# Patient Record
Sex: Male | Born: 1945
Health system: Southern US, Community
[De-identification: ages and names within clinical notes are randomized; demographics above are authoritative.]

## PROBLEM LIST (undated history)

## (undated) DIAGNOSIS — R195 Other fecal abnormalities: Secondary | ICD-10-CM

## (undated) DIAGNOSIS — F419 Anxiety disorder, unspecified: Secondary | ICD-10-CM

## (undated) DIAGNOSIS — J302 Other seasonal allergic rhinitis: Secondary | ICD-10-CM

## (undated) DIAGNOSIS — M1612 Unilateral primary osteoarthritis, left hip: Secondary | ICD-10-CM

## (undated) DIAGNOSIS — Z86718 Personal history of other venous thrombosis and embolism: Secondary | ICD-10-CM

## (undated) DIAGNOSIS — I4891 Unspecified atrial fibrillation: Secondary | ICD-10-CM

## (undated) DIAGNOSIS — G4730 Sleep apnea, unspecified: Secondary | ICD-10-CM

## (undated) DIAGNOSIS — I701 Atherosclerosis of renal artery: Secondary | ICD-10-CM

## (undated) DIAGNOSIS — R600 Localized edema: Secondary | ICD-10-CM

## (undated) DIAGNOSIS — D539 Nutritional anemia, unspecified: Secondary | ICD-10-CM

## (undated) DIAGNOSIS — I82409 Acute embolism and thrombosis of unspecified deep veins of unspecified lower extremity: Secondary | ICD-10-CM

## (undated) DIAGNOSIS — M109 Gout, unspecified: Secondary | ICD-10-CM

## (undated) DIAGNOSIS — E876 Hypokalemia: Secondary | ICD-10-CM

## (undated) DIAGNOSIS — I499 Cardiac arrhythmia, unspecified: Secondary | ICD-10-CM

## (undated) DIAGNOSIS — E785 Hyperlipidemia, unspecified: Secondary | ICD-10-CM

## (undated) DIAGNOSIS — K625 Hemorrhage of anus and rectum: Secondary | ICD-10-CM

## (undated) DIAGNOSIS — R7303 Prediabetes: Secondary | ICD-10-CM

## (undated) DIAGNOSIS — R0601 Orthopnea: Secondary | ICD-10-CM

## (undated) DIAGNOSIS — Q249 Congenital malformation of heart, unspecified: Secondary | ICD-10-CM

## (undated) DIAGNOSIS — E46 Unspecified protein-calorie malnutrition: Secondary | ICD-10-CM

## (undated) DIAGNOSIS — K219 Gastro-esophageal reflux disease without esophagitis: Secondary | ICD-10-CM

## (undated) DIAGNOSIS — I10 Essential (primary) hypertension: Secondary | ICD-10-CM

## (undated) DIAGNOSIS — M1990 Unspecified osteoarthritis, unspecified site: Secondary | ICD-10-CM

## (undated) DIAGNOSIS — J189 Pneumonia, unspecified organism: Secondary | ICD-10-CM

## (undated) DIAGNOSIS — E119 Type 2 diabetes mellitus without complications: Secondary | ICD-10-CM

## (undated) DIAGNOSIS — E8809 Other disorders of plasma-protein metabolism, not elsewhere classified: Secondary | ICD-10-CM

## (undated) HISTORY — DX: Congenital malformation of heart, unspecified: Q24.9

## (undated) HISTORY — DX: Sleep apnea, unspecified: G47.30

## (undated) HISTORY — DX: Unspecified atrial fibrillation: I48.91

## (undated) HISTORY — DX: Gout, unspecified: M10.9

## (undated) HISTORY — DX: Other seasonal allergic rhinitis: J30.2

## (undated) HISTORY — PX: CARPAL TUNNEL RELEASE: SHX101

## (undated) HISTORY — DX: Prediabetes: R73.03

## (undated) HISTORY — DX: Localized edema: R60.0

## (undated) HISTORY — DX: Hemorrhage of anus and rectum: K62.5

## (undated) HISTORY — DX: Type 2 diabetes mellitus without complications: E11.9

## (undated) HISTORY — DX: Anxiety disorder, unspecified: F41.9

## (undated) HISTORY — DX: Unilateral primary osteoarthritis, left hip: M16.12

## (undated) HISTORY — DX: Unspecified osteoarthritis, unspecified site: M19.90

## (undated) HISTORY — DX: Unspecified protein-calorie malnutrition: E46

## (undated) HISTORY — DX: Acute embolism and thrombosis of unspecified deep veins of unspecified lower extremity: I82.409

## (undated) HISTORY — DX: Personal history of other venous thrombosis and embolism: Z86.718

## (undated) HISTORY — DX: Hyperlipidemia, unspecified: E78.5

## (undated) HISTORY — DX: Gastro-esophageal reflux disease without esophagitis: K21.9

## (undated) HISTORY — DX: Essential (primary) hypertension: I10

## (undated) HISTORY — DX: Other fecal abnormalities: R19.5

## (undated) HISTORY — PX: APPENDECTOMY: SHX54

## (undated) HISTORY — DX: Orthopnea: R06.01

## (undated) HISTORY — DX: Morbid (severe) obesity due to excess calories: E66.01

## (undated) HISTORY — DX: Hypokalemia: E87.6

## (undated) HISTORY — DX: Other disorders of plasma-protein metabolism, not elsewhere classified: E88.09

## (undated) HISTORY — DX: Nutritional anemia, unspecified: D53.9

---

## 2014-02-07 ENCOUNTER — Other Ambulatory Visit: Payer: Self-pay | Admitting: *Deleted

## 2014-02-07 ENCOUNTER — Encounter: Payer: Self-pay | Admitting: Surgery

## 2014-02-07 DIAGNOSIS — I701 Atherosclerosis of renal artery: Secondary | ICD-10-CM

## 2014-02-10 ENCOUNTER — Encounter: Payer: Self-pay | Admitting: Surgery

## 2014-02-13 ENCOUNTER — Encounter: Payer: Self-pay | Admitting: Surgery

## 2014-02-13 ENCOUNTER — Ambulatory Visit (INDEPENDENT_AMBULATORY_CARE_PROVIDER_SITE_OTHER): Payer: Commercial Managed Care - HMO | Admitting: Surgery

## 2014-02-13 ENCOUNTER — Encounter: Payer: Commercial Managed Care - HMO | Admitting: Surgery

## 2014-02-13 ENCOUNTER — Ambulatory Visit (HOSPITAL_COMMUNITY)
Admission: RE | Admit: 2014-02-13 | Discharge: 2014-02-13 | Disposition: A | Discharge status: Home / Self Care | Payer: Medicare HMO | Source: Ambulatory Visit | Attending: Surgery | Admitting: Surgery

## 2014-02-13 VITALS — BP 136/79 | HR 82 | Temp 98.5°F | Resp 18 | Ht 68.0 in | Wt 216.0 lb

## 2014-02-13 DIAGNOSIS — I701 Atherosclerosis of renal artery: Secondary | ICD-10-CM | POA: Diagnosis not present

## 2014-02-13 NOTE — Progress Notes (Signed)
Patient name: Logan [name] MRN: [id number] DOB: 01-13-1946 Sex: male   Referred by: Dr. Laqueta Due  Reason for referral:  Chief Complaint  Patient presents with  . Arterial Stenosis    Renal artery stenosis  REF- Dr. Laqueta Due   Pt has elevated HTN     HISTORY OF PRESENT ILLNESS: This is a very pleasant 68 year old gentleman who is referred today for evaluation of possible renal artery stenosis.  The patient had a renal artery ultrasound and aspirin which suggested right renal artery stenosis.  The patient has had a very difficult time with blood pressure control, requiring multiple medications.  The patient suffers from diabetes.  His blood sugars have been under good control, ranging from 85-1 05.  The patient has a history of tobacco abuse.  He quit smoking in 1998.  He used chewing tobacco currently.  Past Medical History  Diagnosis Date  . Hypertension   . Diabetes mellitus without complication   . Esophageal reflux   . Gout     Past Surgical History  Procedure Laterality Date  . Appendectomy      History   Social History  . Marital Status: Widowed    Spouse Name: N/A    Number of Children: N/A  . Years of Education: N/A   Occupational History  . Not on file.   Social History Main Topics  . Smoking status: Former Smoker    Quit date: 02/07/1997  . Smokeless tobacco: Current User    Types: Chew  . Alcohol Use: Not on file  . Drug Use: Not on file  . Sexual Activity: Not on file   Other Topics Concern  . Not on file   Social History Narrative  . No narrative on file    Family History  Problem Relation Age of Onset  . Heart disease Mother   . Cancer Father     throat cancer    Allergies as of 02/13/2014  . (No Known Allergies)    Current Outpatient Prescriptions on File Prior to Visit  Medication Sig Dispense Refill  . allopurinol (ZYLOPRIM) 300 MG tablet Take 300 mg by mouth daily.      Marland Kitchen ALPRAZolam (XANAX) 0.25 MG tablet Take 0.25 mg by  mouth 2 (two) times daily as needed for anxiety.      Marland Kitchen aspirin 81 MG tablet Take 81 mg by mouth daily.      . Cholecalciferol (VITAMIN D-3) 1000 UNITS CAPS Take 1,000 Units by mouth daily.      Marland Kitchen DHEA 25 MG CAPS Take 25 mg by mouth daily.      . hydrALAZINE (APRESOLINE) 50 MG tablet Take 50 mg by mouth 3 (three) times daily.      Marland Kitchen ketoconazole (NIZORAL) 2 % cream Apply 1 application topically daily.      Marland Kitchen LOSARTAN POTASSIUM-HCTZ PO Take 100 mg by mouth daily.      . metFORMIN (GLUCOPHAGE) 500 MG tablet Take by mouth 2 (two) times daily with a meal.      . omeprazole (PRILOSEC) 20 MG capsule Take 20 mg by mouth daily.      . vitamin B-12 (CYANOCOBALAMIN) 1000 MCG tablet Take 1,000 mcg by mouth daily.       No current facility-administered medications on file prior to visit.     REVIEW OF SYSTEMS: Cardiovascular: No chest pain, chest pressure, palpitations, orthopnea, or dyspnea on exertion. No claudication or rest pain,  No history of DVT or phlebitis. Pulmonary: No  productive cough, asthma or wheezing. Neurologic: Positive leg weakness, no paresthesias, aphasia, or amaurosis. No dizziness. Hematologic: No bleeding problems or clotting disorders. Musculoskeletal: No joint pain or joint swelling. Gastrointestinal: No blood in stool or hematemesis Genitourinary: No dysuria or hematuria. Psychiatric:: No history of major depression. Integumentary: No rashes or ulcers. Constitutional: No fever or chills.  PHYSICAL EXAMINATION: General: The patient appears their stated age.  Vital signs are BP 136/79  Pulse 82  Temp(Src) 98.5 F (36.9 C) (Oral)  Resp 18  Ht 5\' 8"  (1.727 m)  Wt 216 lb (97.977 kg)  BMI 32.85 kg/m2  SpO2 100% HEENT:  No gross abnormalities Pulmonary: Respirations are non-labored Abdomen: Soft and non-tender.  No abdominal bruits.  Musculoskeletal: There are no major deformities.   Neurologic: No focal weakness or paresthesias are detected, Skin: There are no ulcer  or rashes noted. Psychiatric: The patient has normal affect. Cardiovascular: There is a regular rate and rhythm without significant murmur appreciated.  Palpable dorsalis pedis pulse bilaterally.  No carotid bruits.  Diagnostic Studies: I have reviewed his outside ultrasound which suggests right renal artery stenosis, greater than 60%.  I have also ordered and reviewed a repeat ultrasound here in our office.  This did not show evidence of renal artery stenosis.   Assessment:  Possible right renal artery stenosis Plan: There are conflicting reports from his renal Doppler study.  Because his blood pressure has been so difficult to control, I feel we need to proceed with the goal standard which would be abdominal angiography.  If stenosis is identified in either the right or the left renal artery, stenting would be performed at that time.  The risks and benefits of the procedure were discussed with the patient, including the fact that even with stenting this may not correct his blood pressure problems.  He wishes to proceed.  The procedure has been scheduled for Tuesday, November 10.     Eldridge Abrahams, M.D. [facility] Office: [phone] Pager:  [phone]

## 2014-02-14 ENCOUNTER — Other Ambulatory Visit: Payer: Self-pay | Admitting: *Deleted

## 2014-02-14 ENCOUNTER — Encounter: Payer: Self-pay | Admitting: *Deleted

## 2014-02-16 ENCOUNTER — Encounter (HOSPITAL_COMMUNITY): Payer: Self-pay | Admitting: Pharmacy Technician

## 2014-02-28 ENCOUNTER — Encounter (HOSPITAL_COMMUNITY)
Admission: RE | Disposition: A | Discharge status: Home / Self Care | Payer: Self-pay | Source: Ambulatory Visit | Attending: Surgery

## 2014-02-28 ENCOUNTER — Encounter (HOSPITAL_COMMUNITY): Payer: Self-pay | Admitting: Surgery

## 2014-02-28 ENCOUNTER — Ambulatory Visit (HOSPITAL_COMMUNITY)
Admission: RE | Admit: 2014-02-28 | Discharge: 2014-02-28 | Disposition: A | Discharge status: Home / Self Care | Payer: Medicare HMO | Source: Ambulatory Visit | Attending: Surgery | Admitting: Surgery

## 2014-02-28 DIAGNOSIS — K219 Gastro-esophageal reflux disease without esophagitis: Secondary | ICD-10-CM | POA: Insufficient documentation

## 2014-02-28 DIAGNOSIS — I701 Atherosclerosis of renal artery: Secondary | ICD-10-CM | POA: Diagnosis not present

## 2014-02-28 DIAGNOSIS — E119 Type 2 diabetes mellitus without complications: Secondary | ICD-10-CM | POA: Diagnosis not present

## 2014-02-28 DIAGNOSIS — I10 Essential (primary) hypertension: Secondary | ICD-10-CM | POA: Insufficient documentation

## 2014-02-28 DIAGNOSIS — F17220 Nicotine dependence, chewing tobacco, uncomplicated: Secondary | ICD-10-CM | POA: Diagnosis not present

## 2014-02-28 DIAGNOSIS — M109 Gout, unspecified: Secondary | ICD-10-CM | POA: Insufficient documentation

## 2014-02-28 DIAGNOSIS — Z7982 Long term (current) use of aspirin: Secondary | ICD-10-CM | POA: Diagnosis not present

## 2014-02-28 DIAGNOSIS — Z79899 Other long term (current) drug therapy: Secondary | ICD-10-CM | POA: Diagnosis not present

## 2014-02-28 HISTORY — DX: Atherosclerosis of renal artery: I70.1

## 2014-02-28 HISTORY — PX: ABDOMINAL AORTAGRAM: SHX5454

## 2014-02-28 LAB — GLUCOSE, CAPILLARY
Glucose-Capillary: 97 mg/dL (ref 70–99)
Glucose-Capillary: 90 mg/dL (ref 70–99)
Glucose-Capillary: 94 mg/dL (ref 70–99)

## 2014-02-28 LAB — POCT I-STAT, CHEM 8
BUN: 11 mg/dL (ref 6–23)
CALCIUM ION: 1.22 mmol/L (ref 1.13–1.30)
CHLORIDE: 108 meq/L (ref 96–112)
CREATININE: 0.7 mg/dL (ref 0.50–1.35)
GLUCOSE: 103 mg/dL — AB (ref 70–99)
HCT: 46 % (ref 39.0–52.0)
Hemoglobin: 15.6 g/dL (ref 13.0–17.0)
Potassium: 3.4 mEq/L — ABNORMAL LOW (ref 3.7–5.3)
Sodium: 142 mEq/L (ref 137–147)
TCO2: 22 mmol/L (ref 0–100)

## 2014-02-28 SURGERY — ABDOMINAL AORTAGRAM
Anesthesia: LOCAL

## 2014-02-28 MED ORDER — HYDRALAZINE HCL 20 MG/ML IJ SOLN: 5.0000 mg | INTRAMUSCULAR | Status: DC | PRN

## 2014-02-28 MED ORDER — LABETALOL HCL 5 MG/ML IV SOLN: 10.0000 mg | INTRAVENOUS | Status: DC | PRN

## 2014-02-28 MED ORDER — MORPHINE SULFATE 10 MG/ML IJ SOLN: 2.0000 mg | INTRAMUSCULAR | Status: DC | PRN

## 2014-02-28 MED ORDER — LIDOCAINE HCL (PF) 1 % IJ SOLN
INTRAMUSCULAR | Status: AC
  Filled 2014-02-28: qty 30

## 2014-02-28 MED ORDER — GUAIFENESIN-DM 100-10 MG/5ML PO SYRP: 15.0000 mL | ORAL_SOLUTION | ORAL | Status: DC | PRN

## 2014-02-28 MED ORDER — PHENOL 1.4 % MT LIQD: 1.0000 | OROMUCOSAL | Status: DC | PRN

## 2014-02-28 MED ORDER — ONDANSETRON HCL 4 MG/2ML IJ SOLN: 4.0000 mg | Freq: Four times a day (QID) | INTRAMUSCULAR | Status: DC | PRN

## 2014-02-28 MED ORDER — METOPROLOL TARTRATE 1 MG/ML IV SOLN: 2.0000 mg | INTRAVENOUS | Status: DC | PRN

## 2014-02-28 MED ORDER — FENTANYL CITRATE 0.05 MG/ML IJ SOLN
INTRAMUSCULAR | Status: AC
  Filled 2014-02-28: qty 2

## 2014-02-28 MED ORDER — OXYCODONE HCL 5 MG PO TABS: 5.0000 mg | ORAL_TABLET | ORAL | Status: DC | PRN

## 2014-02-28 MED ORDER — ACETAMINOPHEN 325 MG RE SUPP: 325.0000 mg | RECTAL | Status: DC | PRN

## 2014-02-28 MED ORDER — MIDAZOLAM HCL 2 MG/2ML IJ SOLN
INTRAMUSCULAR | Status: AC
  Filled 2014-02-28: qty 2

## 2014-02-28 MED ORDER — ACETAMINOPHEN 325 MG PO TABS: 325.0000 mg | ORAL_TABLET | ORAL | Status: DC | PRN

## 2014-02-28 MED ORDER — HEPARIN (PORCINE) IN NACL 2-0.9 UNIT/ML-% IJ SOLN
INTRAMUSCULAR | Status: AC
  Filled 2014-02-28: qty 1000

## 2014-02-28 MED ORDER — SODIUM CHLORIDE 0.9 % IV SOLN
INTRAVENOUS | Status: DC
  Administered 2014-02-28: 07:00:00 via INTRAVENOUS

## 2014-02-28 MED ORDER — ALUM & MAG HYDROXIDE-SIMETH 200-200-20 MG/5ML PO SUSP: 15.0000 mL | ORAL | Status: DC | PRN

## 2014-02-28 MED ORDER — SODIUM CHLORIDE 0.9 % IV SOLN: 1.0000 mL/kg/h | INTRAVENOUS | Status: DC

## 2014-02-28 SURGICAL SUPPLY — 53 items
BANDAGE ELASTIC 4 VELCRO ST LF (GAUZE/BANDAGES/DRESSINGS) IMPLANT
BANDAGE ESMARK 6X9 LF (GAUZE/BANDAGES/DRESSINGS) IMPLANT
BNDG ESMARK 6X9 LF (GAUZE/BANDAGES/DRESSINGS)
CANISTER SUCTION 2500CC (MISCELLANEOUS) ×2 IMPLANT
CLIP TI MEDIUM 24 (CLIP) ×2 IMPLANT
CLIP TI WIDE RED SMALL 24 (CLIP) ×2 IMPLANT
COVER SURGICAL LIGHT HANDLE (MISCELLANEOUS) ×2 IMPLANT
CUFF TOURNIQUET SINGLE 24IN (TOURNIQUET CUFF) IMPLANT
CUFF TOURNIQUET SINGLE 34IN LL (TOURNIQUET CUFF) IMPLANT
CUFF TOURNIQUET SINGLE 44IN (TOURNIQUET CUFF) IMPLANT
DERMABOND ADVANCED (GAUZE/BANDAGES/DRESSINGS) ×1
DERMABOND ADVANCED .7 DNX12 (GAUZE/BANDAGES/DRESSINGS) ×1 IMPLANT
DRAIN CHANNEL 15F RND FF W/TCR (WOUND CARE) IMPLANT
DRAPE WARM FLUID 44X44 (DRAPE) ×2 IMPLANT
DRAPE X-RAY CASS 24X20 (DRAPES) IMPLANT
DRSG COVADERM 4X10 (GAUZE/BANDAGES/DRESSINGS) IMPLANT
DRSG COVADERM 4X8 (GAUZE/BANDAGES/DRESSINGS) IMPLANT
ELECT REM PT RETURN 9FT ADLT (ELECTROSURGICAL) ×2
ELECTRODE REM PT RTRN 9FT ADLT (ELECTROSURGICAL) ×1 IMPLANT
EVACUATOR SILICONE 100CC (DRAIN) IMPLANT
GLOVE BIOGEL PI IND STRL 7.5 (GLOVE) ×1 IMPLANT
GLOVE BIOGEL PI INDICATOR 7.5 (GLOVE) ×1
GLOVE SURG SS PI 7.5 STRL IVOR (GLOVE) ×2 IMPLANT
GOWN PREVENTION PLUS XXLARGE (GOWN DISPOSABLE) ×2 IMPLANT
GOWN STRL NON-REIN LRG LVL3 (GOWN DISPOSABLE) ×6 IMPLANT
HEMOSTAT SNOW SURGICEL 2X4 (HEMOSTASIS) IMPLANT
KIT BASIN OR (CUSTOM PROCEDURE TRAY) ×2 IMPLANT
KIT ROOM TURNOVER OR (KITS) ×2 IMPLANT
MARKER GRAFT CORONARY BYPASS (MISCELLANEOUS) IMPLANT
NS IRRIG 1000ML POUR BTL (IV SOLUTION) ×4 IMPLANT
PACK PERIPHERAL VASCULAR (CUSTOM PROCEDURE TRAY) ×2 IMPLANT
PAD ARMBOARD 7.5X6 YLW CONV (MISCELLANEOUS) ×4 IMPLANT
PADDING CAST COTTON 6X4 STRL (CAST SUPPLIES) IMPLANT
SET COLLECT BLD 21X3/4 12 (NEEDLE) IMPLANT
STOPCOCK 4 WAY LG BORE MALE ST (IV SETS) IMPLANT
SUT ETHILON 3 0 PS 1 (SUTURE) IMPLANT
SUT PROLENE 5 0 C 1 24 (SUTURE) ×2 IMPLANT
SUT PROLENE 6 0 BV (SUTURE) ×2 IMPLANT
SUT PROLENE 7 0 BV 1 (SUTURE) IMPLANT
SUT SILK 2 0 SH (SUTURE) ×2 IMPLANT
SUT SILK 3 0 (SUTURE)
SUT SILK 3-0 18XBRD TIE 12 (SUTURE) IMPLANT
SUT VIC AB 2-0 CT1 27 (SUTURE) ×2
SUT VIC AB 2-0 CT1 TAPERPNT 27 (SUTURE) ×2 IMPLANT
SUT VIC AB 3-0 SH 27 (SUTURE) ×2
SUT VIC AB 3-0 SH 27X BRD (SUTURE) ×2 IMPLANT
SUT VICRYL 4-0 PS2 18IN ABS (SUTURE) ×4 IMPLANT
TOWEL OR 17X24 6PK STRL BLUE (TOWEL DISPOSABLE) ×4 IMPLANT
TOWEL OR 17X26 10 PK STRL BLUE (TOWEL DISPOSABLE) ×4 IMPLANT
TRAY FOLEY CATH 16FRSI W/METER (SET/KITS/TRAYS/PACK) ×2 IMPLANT
TUBING EXTENTION W/L.L. (IV SETS) IMPLANT
UNDERPAD 30X30 INCONTINENT (UNDERPADS AND DIAPERS) ×2 IMPLANT
WATER STERILE IRR 1000ML POUR (IV SOLUTION) ×2 IMPLANT

## 2014-02-28 NOTE — Progress Notes (Signed)
Site area: right groin a 5 french sheath was removed  Site Prior to Removal:  Level 0  Pressure Applied For 20 MINUTES    Minutes Beginning at 0830am  Manual:   Yes.    Patient Status During Pull:  stable  Post Pull Groin Site:  Level 0  Post Pull Instructions Given:  Yes.    Post Pull Pulses Present:  Yes.    Dressing Applied:  Yes.    Comments:  VS remain  Stable during sheath pull.  Pt denies any discomfort at site

## 2014-02-28 NOTE — Interval H&P Note (Signed)
History and Physical Interval Note:  02/28/2014 7:36 AM  Logan [name]  has presented today for surgery, with the diagnosis of renal arotery stenosis  The various methods of treatment have been discussed with the patient and family. After consideration of risks, benefits and other options for treatment, the patient has consented to  Procedure(s): ABDOMINAL AORTAGRAM (N/A) as a surgical intervention .  The patient's history has been reviewed, patient examined, no change in status, stable for surgery.  I have reviewed the patient's chart and labs.  Questions were answered to the patient's satisfaction.     BRABHAM IV, V. WELLS

## 2014-02-28 NOTE — H&P (View-Only) (Signed)
Patient name: Logan [name] MRN: [id number] DOB: 05/24/1945 Sex: male   Referred by: Dr. Laqueta Due  Reason for referral:  Chief Complaint  Patient presents with  . Arterial Stenosis    Renal artery stenosis  REF- Dr. Laqueta Due   Pt has elevated HTN     HISTORY OF PRESENT ILLNESS: This is a very pleasant 68 year old gentleman who is referred today for evaluation of possible renal artery stenosis.  The patient had a renal artery ultrasound and aspirin which suggested right renal artery stenosis.  The patient has had a very difficult time with blood pressure control, requiring multiple medications.  The patient suffers from diabetes.  His blood sugars have been under good control, ranging from 85-1 05.  The patient has a history of tobacco abuse.  He quit smoking in 1998.  He used chewing tobacco currently.  Past Medical History  Diagnosis Date  . Hypertension   . Diabetes mellitus without complication   . Esophageal reflux   . Gout     Past Surgical History  Procedure Laterality Date  . Appendectomy      History   Social History  . Marital Status: Widowed    Spouse Name: N/A    Number of Children: N/A  . Years of Education: N/A   Occupational History  . Not on file.   Social History Main Topics  . Smoking status: Former Smoker    Quit date: 02/07/1997  . Smokeless tobacco: Current User    Types: Chew  . Alcohol Use: Not on file  . Drug Use: Not on file  . Sexual Activity: Not on file   Other Topics Concern  . Not on file   Social History Narrative  . No narrative on file    Family History  Problem Relation Age of Onset  . Heart disease Mother   . Cancer Father     throat cancer    Allergies as of 02/13/2014  . (No Known Allergies)    Current Outpatient Prescriptions on File Prior to Visit  Medication Sig Dispense Refill  . allopurinol (ZYLOPRIM) 300 MG tablet Take 300 mg by mouth daily.      Marland Kitchen ALPRAZolam (XANAX) 0.25 MG tablet Take 0.25 mg by  mouth 2 (two) times daily as needed for anxiety.      Marland Kitchen aspirin 81 MG tablet Take 81 mg by mouth daily.      . Cholecalciferol (VITAMIN D-3) 1000 UNITS CAPS Take 1,000 Units by mouth daily.      Marland Kitchen DHEA 25 MG CAPS Take 25 mg by mouth daily.      . hydrALAZINE (APRESOLINE) 50 MG tablet Take 50 mg by mouth 3 (three) times daily.      Marland Kitchen ketoconazole (NIZORAL) 2 % cream Apply 1 application topically daily.      Marland Kitchen LOSARTAN POTASSIUM-HCTZ PO Take 100 mg by mouth daily.      . metFORMIN (GLUCOPHAGE) 500 MG tablet Take by mouth 2 (two) times daily with a meal.      . omeprazole (PRILOSEC) 20 MG capsule Take 20 mg by mouth daily.      . vitamin B-12 (CYANOCOBALAMIN) 1000 MCG tablet Take 1,000 mcg by mouth daily.       No current facility-administered medications on file prior to visit.     REVIEW OF SYSTEMS: Cardiovascular: No chest pain, chest pressure, palpitations, orthopnea, or dyspnea on exertion. No claudication or rest pain,  No history of DVT or phlebitis. Pulmonary: No  productive cough, asthma or wheezing. Neurologic: Positive leg weakness, no paresthesias, aphasia, or amaurosis. No dizziness. Hematologic: No bleeding problems or clotting disorders. Musculoskeletal: No joint pain or joint swelling. Gastrointestinal: No blood in stool or hematemesis Genitourinary: No dysuria or hematuria. Psychiatric:: No history of major depression. Integumentary: No rashes or ulcers. Constitutional: No fever or chills.  PHYSICAL EXAMINATION: General: The patient appears their stated age.  Vital signs are BP 136/79  Pulse 82  Temp(Src) 98.5 F (36.9 C) (Oral)  Resp 18  Ht 5\' 8"  (1.727 m)  Wt 216 lb (97.977 kg)  BMI 32.85 kg/m2  SpO2 100% HEENT:  No gross abnormalities Pulmonary: Respirations are non-labored Abdomen: Soft and non-tender.  No abdominal bruits.  Musculoskeletal: There are no major deformities.   Neurologic: No focal weakness or paresthesias are detected, Skin: There are no ulcer  or rashes noted. Psychiatric: The patient has normal affect. Cardiovascular: There is a regular rate and rhythm without significant murmur appreciated.  Palpable dorsalis pedis pulse bilaterally.  No carotid bruits.  Diagnostic Studies: I have reviewed his outside ultrasound which suggests right renal artery stenosis, greater than 60%.  I have also ordered and reviewed a repeat ultrasound here in our office.  This did not show evidence of renal artery stenosis.   Assessment:  Possible right renal artery stenosis Plan: There are conflicting reports from his renal Doppler study.  Because his blood pressure has been so difficult to control, I feel we need to proceed with the goal standard which would be abdominal angiography.  If stenosis is identified in either the right or the left renal artery, stenting would be performed at that time.  The risks and benefits of the procedure were discussed with the patient, including the fact that even with stenting this may not correct his blood pressure problems.  He wishes to proceed.  The procedure has been scheduled for Tuesday, November 10.     Eldridge Abrahams, M.D. [facility] [place] Office: [phone] Pager:  [phone]

## 2014-02-28 NOTE — Op Note (Signed)
    Patient name: Logan [name] MRN: [id number] DOB: January 31, 1946 Sex: male  02/28/2014 Pre-operative Diagnosis: possible right renal artery stenosis Post-operative diagnosis:  Same Surgeon:  Eldridge Abrahams Procedure Performed:  1.  Ultrasound-guided access, right femoral artery  2.  Abdominal aortogram with bifemoral runoff  3.  Conscious sedation, 30 minutes   Indications:  The patient has uncontrolled hypertension.  An outside ultrasound showed greater than 60% right renal artery stenosis.  This was repeated in my office which did not show renal artery stenosis.  He is here for confirmatory testing.  Procedure:  The patient was identified in the holding area and taken to room 8.  The patient was then placed supine on the table and prepped and draped in the usual sterile fashion.  A time out was called.  Conscious sedation was performed under direct physician monitoring with the use of IV fentanyl and Versed for 30 minutes.n  Ultrasound was used to evaluate the right common femoral artery.  It was patent .  A digital ultrasound image was acquired.  A micropuncture needle was used to access the right common femoral artery under ultrasound guidance.  An 018 wire was advanced without resistance and a micropuncture sheath was placed.  The 018 wire was removed and a benson wire was placed.  The micropuncture sheath was exchanged for a 5 french sheath.  An omniflush catheter was advanced over the wire to the level of L-1.  An abdominal bifemoral angiogram was obtained.  Next, the pigtail was rotated into a RAO and LAO positioning, and repeat angiography was performed to better define the ostium of the renal arteries.   Findings:   Aortogram:  No significant suprarenal aortic stenosis.  The visceral vessels appear widely patent.  There is approximately 20% stenosis at the origin of the right renal artery.  The left renal artery is widely patent.  The infrarenal abdominal aorta is widely patent.   Bilateral common and external iliac arteries are widely patent without stenosis.  Right Lower Extremity:  Visualized portions of the right common femoral artery are widely patent  Left Lower Extremity:  Visualized portions the left femoral artery are widely patent  Intervention:  none  Impression:  #1  No significant aortoiliac stenosis.  #2  Approximately 20% right renal artery stenosis  #3  No significant left renal artery stenosis   V. Annamarie Major, M.D. [facility] [place] [facility] Office: [phone] Pager:  [phone]

## 2014-02-28 NOTE — Discharge Instructions (Signed)
Angiogram, Care After °Refer to this sheet in the next few weeks. These instructions provide you with information on caring for yourself after your procedure. Your health care provider may also give you more specific instructions. Your treatment has been planned according to current medical practices, but problems sometimes occur. Call your health care provider if you have any problems or questions after your procedure.  °WHAT TO EXPECT AFTER THE PROCEDURE °After your procedure, it is typical to have the following sensations: °· Minor discomfort or tenderness and a small bump at the catheter insertion site. The bump should usually decrease in size and tenderness within 1 to 2 weeks. °· Any bruising will usually fade within 2 to 4 weeks. °HOME CARE INSTRUCTIONS  °· You may need to keep taking blood thinners if they were prescribed for you. Take medicines only as directed by your health care provider. °· Do not apply powder or lotion to the site. °· Do not take baths, swim, or use a hot tub until your health care provider approves. °· You may shower 24 hours after the procedure. Remove the bandage (dressing) and gently wash the site with plain soap and water. Gently pat the site dry. °· Inspect the site at least twice daily. °· Limit your activity for the first 48 hours. Do not bend, squat, or lift anything over 20 lb (9 kg) or as directed by your health care provider. °· Plan to have someone take you home after the procedure. Follow instructions about when you can drive or return to work. °SEEK MEDICAL CARE IF: °· You get light-headed when standing up. °· You have drainage (other than a small amount of blood on the dressing). °· You have chills. °· You have a fever. °· You have redness, warmth, swelling, or pain at the insertion site. °SEEK IMMEDIATE MEDICAL CARE IF:  °· You develop chest pain or shortness of breath, feel faint, or pass out. °· You have bleeding, swelling larger than a walnut, or drainage from the  catheter insertion site. °· You develop pain, discoloration, coldness, or severe bruising in the leg or arm that held the catheter. °· You have heavy bleeding from the site. If this happens, hold pressure on the site and call 911. °MAKE SURE YOU: °· Understand these instructions. °· Will watch your condition. °· Will get help right away if you are not doing well or get worse. °Document Released: 10/24/2004 Document Revised: 08/22/2013 Document Reviewed: 08/30/2012 °[employer]® Patient Information ©2015 [employer]. This information is not intended to replace advice given to you by your health care provider. Make sure you discuss any questions you have with your health care provider. ° °

## 2014-03-30 ENCOUNTER — Encounter (HOSPITAL_COMMUNITY): Payer: Self-pay | Admitting: Surgery

## 2014-04-25 DIAGNOSIS — D485 Neoplasm of uncertain behavior of skin: Secondary | ICD-10-CM | POA: Diagnosis not present

## 2014-04-25 DIAGNOSIS — L570 Actinic keratosis: Secondary | ICD-10-CM | POA: Diagnosis not present

## 2014-06-23 DIAGNOSIS — N529 Male erectile dysfunction, unspecified: Secondary | ICD-10-CM | POA: Diagnosis not present

## 2014-06-23 DIAGNOSIS — M109 Gout, unspecified: Secondary | ICD-10-CM | POA: Diagnosis not present

## 2014-06-23 DIAGNOSIS — I10 Essential (primary) hypertension: Secondary | ICD-10-CM | POA: Diagnosis not present

## 2014-06-23 DIAGNOSIS — E538 Deficiency of other specified B group vitamins: Secondary | ICD-10-CM | POA: Diagnosis not present

## 2014-06-23 DIAGNOSIS — E119 Type 2 diabetes mellitus without complications: Secondary | ICD-10-CM | POA: Diagnosis not present

## 2014-06-23 DIAGNOSIS — E785 Hyperlipidemia, unspecified: Secondary | ICD-10-CM | POA: Diagnosis not present

## 2014-06-23 DIAGNOSIS — F411 Generalized anxiety disorder: Secondary | ICD-10-CM | POA: Diagnosis not present

## 2014-06-23 DIAGNOSIS — Z79899 Other long term (current) drug therapy: Secondary | ICD-10-CM | POA: Diagnosis not present

## 2014-06-23 DIAGNOSIS — R209 Unspecified disturbances of skin sensation: Secondary | ICD-10-CM | POA: Diagnosis not present

## 2014-08-09 DIAGNOSIS — E785 Hyperlipidemia, unspecified: Secondary | ICD-10-CM | POA: Diagnosis not present

## 2014-08-09 DIAGNOSIS — E119 Type 2 diabetes mellitus without complications: Secondary | ICD-10-CM | POA: Diagnosis not present

## 2014-08-09 DIAGNOSIS — N529 Male erectile dysfunction, unspecified: Secondary | ICD-10-CM | POA: Diagnosis not present

## 2014-08-09 DIAGNOSIS — K219 Gastro-esophageal reflux disease without esophagitis: Secondary | ICD-10-CM | POA: Diagnosis not present

## 2014-08-09 DIAGNOSIS — I10 Essential (primary) hypertension: Secondary | ICD-10-CM | POA: Diagnosis not present

## 2014-09-25 DIAGNOSIS — D123 Benign neoplasm of transverse colon: Secondary | ICD-10-CM | POA: Diagnosis not present

## 2014-09-25 DIAGNOSIS — D124 Benign neoplasm of descending colon: Secondary | ICD-10-CM | POA: Diagnosis not present

## 2014-09-25 DIAGNOSIS — Z86010 Personal history of colonic polyps: Secondary | ICD-10-CM | POA: Diagnosis not present

## 2014-11-08 DIAGNOSIS — F419 Anxiety disorder, unspecified: Secondary | ICD-10-CM | POA: Diagnosis not present

## 2014-11-08 DIAGNOSIS — I10 Essential (primary) hypertension: Secondary | ICD-10-CM | POA: Diagnosis not present

## 2014-11-08 DIAGNOSIS — K219 Gastro-esophageal reflux disease without esophagitis: Secondary | ICD-10-CM | POA: Diagnosis not present

## 2014-11-08 DIAGNOSIS — E785 Hyperlipidemia, unspecified: Secondary | ICD-10-CM | POA: Diagnosis not present

## 2014-11-08 DIAGNOSIS — E119 Type 2 diabetes mellitus without complications: Secondary | ICD-10-CM | POA: Diagnosis not present

## 2014-11-17 DIAGNOSIS — E785 Hyperlipidemia, unspecified: Secondary | ICD-10-CM | POA: Diagnosis not present

## 2014-11-17 DIAGNOSIS — Z79899 Other long term (current) drug therapy: Secondary | ICD-10-CM | POA: Diagnosis not present

## 2014-11-17 DIAGNOSIS — E119 Type 2 diabetes mellitus without complications: Secondary | ICD-10-CM | POA: Diagnosis not present

## 2015-03-13 DIAGNOSIS — E785 Hyperlipidemia, unspecified: Secondary | ICD-10-CM | POA: Diagnosis not present

## 2015-03-13 DIAGNOSIS — F419 Anxiety disorder, unspecified: Secondary | ICD-10-CM | POA: Diagnosis not present

## 2015-03-13 DIAGNOSIS — M109 Gout, unspecified: Secondary | ICD-10-CM | POA: Diagnosis not present

## 2015-03-13 DIAGNOSIS — E119 Type 2 diabetes mellitus without complications: Secondary | ICD-10-CM | POA: Diagnosis not present

## 2015-03-13 DIAGNOSIS — Z79899 Other long term (current) drug therapy: Secondary | ICD-10-CM | POA: Diagnosis not present

## 2015-03-13 DIAGNOSIS — I10 Essential (primary) hypertension: Secondary | ICD-10-CM | POA: Diagnosis not present

## 2015-03-13 DIAGNOSIS — J329 Chronic sinusitis, unspecified: Secondary | ICD-10-CM | POA: Diagnosis not present

## 2015-04-24 DIAGNOSIS — K219 Gastro-esophageal reflux disease without esophagitis: Secondary | ICD-10-CM | POA: Diagnosis not present

## 2015-04-24 DIAGNOSIS — E119 Type 2 diabetes mellitus without complications: Secondary | ICD-10-CM | POA: Diagnosis not present

## 2015-04-24 DIAGNOSIS — E785 Hyperlipidemia, unspecified: Secondary | ICD-10-CM | POA: Diagnosis not present

## 2015-04-24 DIAGNOSIS — M1711 Unilateral primary osteoarthritis, right knee: Secondary | ICD-10-CM | POA: Diagnosis not present

## 2015-04-24 DIAGNOSIS — F419 Anxiety disorder, unspecified: Secondary | ICD-10-CM | POA: Diagnosis not present

## 2015-04-24 DIAGNOSIS — M109 Gout, unspecified: Secondary | ICD-10-CM | POA: Diagnosis not present

## 2015-04-24 DIAGNOSIS — I10 Essential (primary) hypertension: Secondary | ICD-10-CM | POA: Diagnosis not present

## 2015-07-11 DIAGNOSIS — I10 Essential (primary) hypertension: Secondary | ICD-10-CM | POA: Diagnosis not present

## 2015-07-11 DIAGNOSIS — L219 Seborrheic dermatitis, unspecified: Secondary | ICD-10-CM | POA: Diagnosis not present

## 2015-07-11 DIAGNOSIS — E785 Hyperlipidemia, unspecified: Secondary | ICD-10-CM | POA: Diagnosis not present

## 2015-07-11 DIAGNOSIS — E119 Type 2 diabetes mellitus without complications: Secondary | ICD-10-CM | POA: Diagnosis not present

## 2015-07-11 DIAGNOSIS — Z79899 Other long term (current) drug therapy: Secondary | ICD-10-CM | POA: Diagnosis not present

## 2015-07-11 DIAGNOSIS — F329 Major depressive disorder, single episode, unspecified: Secondary | ICD-10-CM | POA: Diagnosis not present

## 2015-08-23 DIAGNOSIS — M109 Gout, unspecified: Secondary | ICD-10-CM | POA: Diagnosis not present

## 2015-08-23 DIAGNOSIS — K219 Gastro-esophageal reflux disease without esophagitis: Secondary | ICD-10-CM | POA: Diagnosis not present

## 2015-08-23 DIAGNOSIS — E119 Type 2 diabetes mellitus without complications: Secondary | ICD-10-CM | POA: Diagnosis not present

## 2015-08-23 DIAGNOSIS — J40 Bronchitis, not specified as acute or chronic: Secondary | ICD-10-CM | POA: Diagnosis not present

## 2015-08-23 DIAGNOSIS — J0100 Acute maxillary sinusitis, unspecified: Secondary | ICD-10-CM | POA: Diagnosis not present

## 2015-08-23 DIAGNOSIS — E785 Hyperlipidemia, unspecified: Secondary | ICD-10-CM | POA: Diagnosis not present

## 2015-08-23 DIAGNOSIS — I10 Essential (primary) hypertension: Secondary | ICD-10-CM | POA: Diagnosis not present

## 2015-09-06 DIAGNOSIS — H6123 Impacted cerumen, bilateral: Secondary | ICD-10-CM | POA: Diagnosis not present

## 2015-12-25 DIAGNOSIS — I10 Essential (primary) hypertension: Secondary | ICD-10-CM | POA: Diagnosis not present

## 2015-12-25 DIAGNOSIS — M7071 Other bursitis of hip, right hip: Secondary | ICD-10-CM | POA: Diagnosis not present

## 2015-12-25 DIAGNOSIS — E785 Hyperlipidemia, unspecified: Secondary | ICD-10-CM | POA: Diagnosis not present

## 2015-12-25 DIAGNOSIS — M109 Gout, unspecified: Secondary | ICD-10-CM | POA: Diagnosis not present

## 2015-12-25 DIAGNOSIS — E119 Type 2 diabetes mellitus without complications: Secondary | ICD-10-CM | POA: Diagnosis not present

## 2015-12-25 DIAGNOSIS — Z79899 Other long term (current) drug therapy: Secondary | ICD-10-CM | POA: Diagnosis not present

## 2016-02-21 DIAGNOSIS — I10 Essential (primary) hypertension: Secondary | ICD-10-CM | POA: Diagnosis not present

## 2016-02-28 DIAGNOSIS — I10 Essential (primary) hypertension: Secondary | ICD-10-CM | POA: Diagnosis not present

## 2016-03-10 DIAGNOSIS — I10 Essential (primary) hypertension: Secondary | ICD-10-CM | POA: Diagnosis not present

## 2016-03-10 DIAGNOSIS — R209 Unspecified disturbances of skin sensation: Secondary | ICD-10-CM | POA: Diagnosis not present

## 2016-03-10 DIAGNOSIS — R5383 Other fatigue: Secondary | ICD-10-CM | POA: Diagnosis not present

## 2016-03-17 DIAGNOSIS — I10 Essential (primary) hypertension: Secondary | ICD-10-CM | POA: Diagnosis not present

## 2016-03-25 DIAGNOSIS — H2513 Age-related nuclear cataract, bilateral: Secondary | ICD-10-CM | POA: Diagnosis not present

## 2016-03-25 DIAGNOSIS — E119 Type 2 diabetes mellitus without complications: Secondary | ICD-10-CM | POA: Diagnosis not present

## 2016-04-25 DIAGNOSIS — I10 Essential (primary) hypertension: Secondary | ICD-10-CM | POA: Diagnosis not present

## 2016-04-25 DIAGNOSIS — L853 Xerosis cutis: Secondary | ICD-10-CM | POA: Diagnosis not present

## 2016-04-25 DIAGNOSIS — Z125 Encounter for screening for malignant neoplasm of prostate: Secondary | ICD-10-CM | POA: Diagnosis not present

## 2016-04-25 DIAGNOSIS — Z79899 Other long term (current) drug therapy: Secondary | ICD-10-CM | POA: Diagnosis not present

## 2016-04-25 DIAGNOSIS — E785 Hyperlipidemia, unspecified: Secondary | ICD-10-CM | POA: Diagnosis not present

## 2016-04-25 DIAGNOSIS — E119 Type 2 diabetes mellitus without complications: Secondary | ICD-10-CM | POA: Diagnosis not present

## 2016-06-08 DIAGNOSIS — G4733 Obstructive sleep apnea (adult) (pediatric): Secondary | ICD-10-CM | POA: Diagnosis not present

## 2016-07-21 DIAGNOSIS — G4733 Obstructive sleep apnea (adult) (pediatric): Secondary | ICD-10-CM | POA: Diagnosis not present

## 2016-07-21 DIAGNOSIS — M7071 Other bursitis of hip, right hip: Secondary | ICD-10-CM | POA: Diagnosis not present

## 2016-07-21 DIAGNOSIS — E119 Type 2 diabetes mellitus without complications: Secondary | ICD-10-CM | POA: Diagnosis not present

## 2016-07-21 DIAGNOSIS — M25551 Pain in right hip: Secondary | ICD-10-CM | POA: Diagnosis not present

## 2016-07-21 DIAGNOSIS — I10 Essential (primary) hypertension: Secondary | ICD-10-CM | POA: Diagnosis not present

## 2016-07-21 DIAGNOSIS — E785 Hyperlipidemia, unspecified: Secondary | ICD-10-CM | POA: Diagnosis not present

## 2016-07-25 DIAGNOSIS — R600 Localized edema: Secondary | ICD-10-CM | POA: Diagnosis not present

## 2016-07-25 DIAGNOSIS — J301 Allergic rhinitis due to pollen: Secondary | ICD-10-CM | POA: Diagnosis not present

## 2016-07-28 DIAGNOSIS — G4733 Obstructive sleep apnea (adult) (pediatric): Secondary | ICD-10-CM | POA: Diagnosis not present

## 2016-08-07 DIAGNOSIS — E785 Hyperlipidemia, unspecified: Secondary | ICD-10-CM | POA: Diagnosis not present

## 2016-08-07 DIAGNOSIS — Z79899 Other long term (current) drug therapy: Secondary | ICD-10-CM | POA: Diagnosis not present

## 2016-08-07 DIAGNOSIS — I10 Essential (primary) hypertension: Secondary | ICD-10-CM | POA: Diagnosis not present

## 2016-08-07 DIAGNOSIS — E119 Type 2 diabetes mellitus without complications: Secondary | ICD-10-CM | POA: Diagnosis not present

## 2016-08-27 DIAGNOSIS — G4733 Obstructive sleep apnea (adult) (pediatric): Secondary | ICD-10-CM | POA: Diagnosis not present

## 2016-09-27 DIAGNOSIS — G4733 Obstructive sleep apnea (adult) (pediatric): Secondary | ICD-10-CM | POA: Diagnosis not present

## 2016-10-27 DIAGNOSIS — G4733 Obstructive sleep apnea (adult) (pediatric): Secondary | ICD-10-CM | POA: Diagnosis not present

## 2016-10-30 DIAGNOSIS — I10 Essential (primary) hypertension: Secondary | ICD-10-CM | POA: Diagnosis not present

## 2016-10-30 DIAGNOSIS — E785 Hyperlipidemia, unspecified: Secondary | ICD-10-CM | POA: Diagnosis not present

## 2016-10-30 DIAGNOSIS — S30860A Insect bite (nonvenomous) of lower back and pelvis, initial encounter: Secondary | ICD-10-CM | POA: Diagnosis not present

## 2016-10-30 DIAGNOSIS — W57XXXA Bitten or stung by nonvenomous insect and other nonvenomous arthropods, initial encounter: Secondary | ICD-10-CM | POA: Diagnosis not present

## 2016-10-30 DIAGNOSIS — Z79899 Other long term (current) drug therapy: Secondary | ICD-10-CM | POA: Diagnosis not present

## 2016-10-30 DIAGNOSIS — E119 Type 2 diabetes mellitus without complications: Secondary | ICD-10-CM | POA: Diagnosis not present

## 2016-11-19 DIAGNOSIS — G4733 Obstructive sleep apnea (adult) (pediatric): Secondary | ICD-10-CM | POA: Diagnosis not present

## 2016-11-27 DIAGNOSIS — G4733 Obstructive sleep apnea (adult) (pediatric): Secondary | ICD-10-CM | POA: Diagnosis not present

## 2016-12-28 DIAGNOSIS — G4733 Obstructive sleep apnea (adult) (pediatric): Secondary | ICD-10-CM | POA: Diagnosis not present

## 2017-01-27 DIAGNOSIS — G4733 Obstructive sleep apnea (adult) (pediatric): Secondary | ICD-10-CM | POA: Diagnosis not present

## 2017-02-02 DIAGNOSIS — M109 Gout, unspecified: Secondary | ICD-10-CM | POA: Diagnosis not present

## 2017-02-02 DIAGNOSIS — E785 Hyperlipidemia, unspecified: Secondary | ICD-10-CM | POA: Diagnosis not present

## 2017-02-02 DIAGNOSIS — G4733 Obstructive sleep apnea (adult) (pediatric): Secondary | ICD-10-CM | POA: Diagnosis not present

## 2017-02-02 DIAGNOSIS — M150 Primary generalized (osteo)arthritis: Secondary | ICD-10-CM | POA: Diagnosis not present

## 2017-02-02 DIAGNOSIS — K219 Gastro-esophageal reflux disease without esophagitis: Secondary | ICD-10-CM | POA: Diagnosis not present

## 2017-02-02 DIAGNOSIS — E119 Type 2 diabetes mellitus without complications: Secondary | ICD-10-CM | POA: Diagnosis not present

## 2017-02-02 DIAGNOSIS — I10 Essential (primary) hypertension: Secondary | ICD-10-CM | POA: Diagnosis not present

## 2017-02-02 DIAGNOSIS — Z79899 Other long term (current) drug therapy: Secondary | ICD-10-CM | POA: Diagnosis not present

## 2017-02-27 DIAGNOSIS — G4733 Obstructive sleep apnea (adult) (pediatric): Secondary | ICD-10-CM | POA: Diagnosis not present

## 2017-03-29 DIAGNOSIS — G4733 Obstructive sleep apnea (adult) (pediatric): Secondary | ICD-10-CM | POA: Diagnosis not present

## 2017-04-02 DIAGNOSIS — J40 Bronchitis, not specified as acute or chronic: Secondary | ICD-10-CM | POA: Diagnosis not present

## 2017-05-11 DIAGNOSIS — M150 Primary generalized (osteo)arthritis: Secondary | ICD-10-CM | POA: Diagnosis not present

## 2017-06-05 DIAGNOSIS — Z79899 Other long term (current) drug therapy: Secondary | ICD-10-CM | POA: Diagnosis not present

## 2017-06-05 DIAGNOSIS — M150 Primary generalized (osteo)arthritis: Secondary | ICD-10-CM | POA: Diagnosis not present

## 2017-06-05 DIAGNOSIS — I10 Essential (primary) hypertension: Secondary | ICD-10-CM | POA: Diagnosis not present

## 2017-06-05 DIAGNOSIS — E785 Hyperlipidemia, unspecified: Secondary | ICD-10-CM | POA: Diagnosis not present

## 2017-06-05 DIAGNOSIS — E119 Type 2 diabetes mellitus without complications: Secondary | ICD-10-CM | POA: Diagnosis not present

## 2017-06-05 DIAGNOSIS — J302 Other seasonal allergic rhinitis: Secondary | ICD-10-CM | POA: Diagnosis not present

## 2017-06-05 DIAGNOSIS — M109 Gout, unspecified: Secondary | ICD-10-CM | POA: Diagnosis not present

## 2017-06-05 DIAGNOSIS — F419 Anxiety disorder, unspecified: Secondary | ICD-10-CM | POA: Diagnosis not present

## 2017-07-07 DIAGNOSIS — R209 Unspecified disturbances of skin sensation: Secondary | ICD-10-CM | POA: Diagnosis not present

## 2017-07-07 DIAGNOSIS — R5383 Other fatigue: Secondary | ICD-10-CM | POA: Diagnosis not present

## 2017-07-07 DIAGNOSIS — I10 Essential (primary) hypertension: Secondary | ICD-10-CM | POA: Diagnosis not present

## 2017-07-07 DIAGNOSIS — M2578 Osteophyte, vertebrae: Secondary | ICD-10-CM | POA: Diagnosis not present

## 2017-07-07 DIAGNOSIS — M791 Myalgia, unspecified site: Secondary | ICD-10-CM | POA: Diagnosis not present

## 2017-07-07 DIAGNOSIS — M47812 Spondylosis without myelopathy or radiculopathy, cervical region: Secondary | ICD-10-CM | POA: Diagnosis not present

## 2017-07-07 DIAGNOSIS — M542 Cervicalgia: Secondary | ICD-10-CM | POA: Diagnosis not present

## 2017-07-07 DIAGNOSIS — M2550 Pain in unspecified joint: Secondary | ICD-10-CM | POA: Diagnosis not present

## 2017-07-21 DIAGNOSIS — M150 Primary generalized (osteo)arthritis: Secondary | ICD-10-CM | POA: Diagnosis not present

## 2017-07-21 DIAGNOSIS — I10 Essential (primary) hypertension: Secondary | ICD-10-CM | POA: Diagnosis not present

## 2017-07-21 DIAGNOSIS — M47812 Spondylosis without myelopathy or radiculopathy, cervical region: Secondary | ICD-10-CM | POA: Diagnosis not present

## 2017-07-29 DIAGNOSIS — M5412 Radiculopathy, cervical region: Secondary | ICD-10-CM | POA: Diagnosis not present

## 2017-08-03 DIAGNOSIS — M542 Cervicalgia: Secondary | ICD-10-CM | POA: Diagnosis not present

## 2017-08-03 DIAGNOSIS — M47812 Spondylosis without myelopathy or radiculopathy, cervical region: Secondary | ICD-10-CM | POA: Diagnosis not present

## 2017-08-03 DIAGNOSIS — M5412 Radiculopathy, cervical region: Secondary | ICD-10-CM | POA: Diagnosis not present

## 2017-08-04 DIAGNOSIS — M5412 Radiculopathy, cervical region: Secondary | ICD-10-CM | POA: Diagnosis not present

## 2017-08-26 DIAGNOSIS — G5603 Carpal tunnel syndrome, bilateral upper limbs: Secondary | ICD-10-CM | POA: Diagnosis not present

## 2017-09-02 DIAGNOSIS — G5602 Carpal tunnel syndrome, left upper limb: Secondary | ICD-10-CM | POA: Diagnosis not present

## 2017-09-02 DIAGNOSIS — G4733 Obstructive sleep apnea (adult) (pediatric): Secondary | ICD-10-CM | POA: Diagnosis not present

## 2017-09-02 DIAGNOSIS — K219 Gastro-esophageal reflux disease without esophagitis: Secondary | ICD-10-CM | POA: Diagnosis not present

## 2017-09-02 DIAGNOSIS — M25473 Effusion, unspecified ankle: Secondary | ICD-10-CM | POA: Diagnosis not present

## 2017-09-02 DIAGNOSIS — M150 Primary generalized (osteo)arthritis: Secondary | ICD-10-CM | POA: Diagnosis not present

## 2017-09-02 DIAGNOSIS — M47812 Spondylosis without myelopathy or radiculopathy, cervical region: Secondary | ICD-10-CM | POA: Diagnosis not present

## 2017-09-02 DIAGNOSIS — I10 Essential (primary) hypertension: Secondary | ICD-10-CM | POA: Diagnosis not present

## 2017-09-02 DIAGNOSIS — E785 Hyperlipidemia, unspecified: Secondary | ICD-10-CM | POA: Diagnosis not present

## 2017-09-02 DIAGNOSIS — G5600 Carpal tunnel syndrome, unspecified upper limb: Secondary | ICD-10-CM | POA: Diagnosis not present

## 2017-09-24 DIAGNOSIS — I10 Essential (primary) hypertension: Secondary | ICD-10-CM | POA: Diagnosis not present

## 2017-09-24 DIAGNOSIS — F17210 Nicotine dependence, cigarettes, uncomplicated: Secondary | ICD-10-CM | POA: Diagnosis not present

## 2017-09-24 DIAGNOSIS — G5602 Carpal tunnel syndrome, left upper limb: Secondary | ICD-10-CM | POA: Diagnosis not present

## 2017-09-24 DIAGNOSIS — J449 Chronic obstructive pulmonary disease, unspecified: Secondary | ICD-10-CM | POA: Diagnosis not present

## 2017-09-24 DIAGNOSIS — K219 Gastro-esophageal reflux disease without esophagitis: Secondary | ICD-10-CM | POA: Diagnosis not present

## 2017-09-24 DIAGNOSIS — E119 Type 2 diabetes mellitus without complications: Secondary | ICD-10-CM | POA: Diagnosis not present

## 2017-09-24 DIAGNOSIS — M150 Primary generalized (osteo)arthritis: Secondary | ICD-10-CM | POA: Diagnosis not present

## 2017-09-24 DIAGNOSIS — Z01810 Encounter for preprocedural cardiovascular examination: Secondary | ICD-10-CM | POA: Diagnosis not present

## 2017-09-24 DIAGNOSIS — M109 Gout, unspecified: Secondary | ICD-10-CM | POA: Diagnosis not present

## 2017-09-24 DIAGNOSIS — Z01818 Encounter for other preprocedural examination: Secondary | ICD-10-CM | POA: Diagnosis not present

## 2017-09-24 DIAGNOSIS — M47812 Spondylosis without myelopathy or radiculopathy, cervical region: Secondary | ICD-10-CM | POA: Diagnosis not present

## 2017-09-24 DIAGNOSIS — E7800 Pure hypercholesterolemia, unspecified: Secondary | ICD-10-CM | POA: Diagnosis not present

## 2017-09-29 DIAGNOSIS — R600 Localized edema: Secondary | ICD-10-CM | POA: Diagnosis not present

## 2017-10-30 DIAGNOSIS — M62838 Other muscle spasm: Secondary | ICD-10-CM | POA: Diagnosis not present

## 2017-10-30 DIAGNOSIS — S81831A Puncture wound without foreign body, right lower leg, initial encounter: Secondary | ICD-10-CM | POA: Diagnosis not present

## 2017-11-02 DIAGNOSIS — Z79899 Other long term (current) drug therapy: Secondary | ICD-10-CM | POA: Diagnosis not present

## 2017-11-02 DIAGNOSIS — R252 Cramp and spasm: Secondary | ICD-10-CM | POA: Diagnosis not present

## 2017-11-24 DIAGNOSIS — G5600 Carpal tunnel syndrome, unspecified upper limb: Secondary | ICD-10-CM | POA: Diagnosis not present

## 2017-12-17 DIAGNOSIS — Z6834 Body mass index (BMI) 34.0-34.9, adult: Secondary | ICD-10-CM | POA: Diagnosis not present

## 2017-12-17 DIAGNOSIS — M47812 Spondylosis without myelopathy or radiculopathy, cervical region: Secondary | ICD-10-CM | POA: Diagnosis not present

## 2017-12-17 DIAGNOSIS — Z79899 Other long term (current) drug therapy: Secondary | ICD-10-CM | POA: Diagnosis not present

## 2017-12-17 DIAGNOSIS — M109 Gout, unspecified: Secondary | ICD-10-CM | POA: Diagnosis not present

## 2017-12-17 DIAGNOSIS — E119 Type 2 diabetes mellitus without complications: Secondary | ICD-10-CM | POA: Diagnosis not present

## 2017-12-17 DIAGNOSIS — E785 Hyperlipidemia, unspecified: Secondary | ICD-10-CM | POA: Diagnosis not present

## 2017-12-17 DIAGNOSIS — I10 Essential (primary) hypertension: Secondary | ICD-10-CM | POA: Diagnosis not present

## 2018-01-01 DIAGNOSIS — E870 Hyperosmolality and hypernatremia: Secondary | ICD-10-CM | POA: Diagnosis not present

## 2018-01-22 DIAGNOSIS — M546 Pain in thoracic spine: Secondary | ICD-10-CM | POA: Diagnosis not present

## 2018-01-22 DIAGNOSIS — Z6834 Body mass index (BMI) 34.0-34.9, adult: Secondary | ICD-10-CM | POA: Diagnosis not present

## 2018-03-16 DIAGNOSIS — I10 Essential (primary) hypertension: Secondary | ICD-10-CM | POA: Diagnosis not present

## 2018-03-16 DIAGNOSIS — K219 Gastro-esophageal reflux disease without esophagitis: Secondary | ICD-10-CM | POA: Diagnosis not present

## 2018-03-16 DIAGNOSIS — Z6835 Body mass index (BMI) 35.0-35.9, adult: Secondary | ICD-10-CM | POA: Diagnosis not present

## 2018-03-16 DIAGNOSIS — E785 Hyperlipidemia, unspecified: Secondary | ICD-10-CM | POA: Diagnosis not present

## 2018-03-16 DIAGNOSIS — I701 Atherosclerosis of renal artery: Secondary | ICD-10-CM | POA: Diagnosis not present

## 2018-03-16 DIAGNOSIS — E119 Type 2 diabetes mellitus without complications: Secondary | ICD-10-CM | POA: Diagnosis not present

## 2018-03-16 DIAGNOSIS — R0989 Other specified symptoms and signs involving the circulatory and respiratory systems: Secondary | ICD-10-CM | POA: Diagnosis not present

## 2018-03-16 DIAGNOSIS — Z79899 Other long term (current) drug therapy: Secondary | ICD-10-CM | POA: Diagnosis not present

## 2018-04-19 DIAGNOSIS — R531 Weakness: Secondary | ICD-10-CM | POA: Diagnosis not present

## 2018-04-19 DIAGNOSIS — R0989 Other specified symptoms and signs involving the circulatory and respiratory systems: Secondary | ICD-10-CM | POA: Diagnosis not present

## 2018-06-10 DIAGNOSIS — M109 Gout, unspecified: Secondary | ICD-10-CM | POA: Diagnosis not present

## 2018-06-10 DIAGNOSIS — I10 Essential (primary) hypertension: Secondary | ICD-10-CM | POA: Diagnosis not present

## 2018-06-10 DIAGNOSIS — M150 Primary generalized (osteo)arthritis: Secondary | ICD-10-CM | POA: Diagnosis not present

## 2018-06-10 DIAGNOSIS — Z79899 Other long term (current) drug therapy: Secondary | ICD-10-CM | POA: Diagnosis not present

## 2018-06-10 DIAGNOSIS — R42 Dizziness and giddiness: Secondary | ICD-10-CM | POA: Diagnosis not present

## 2018-06-10 DIAGNOSIS — Z6834 Body mass index (BMI) 34.0-34.9, adult: Secondary | ICD-10-CM | POA: Diagnosis not present

## 2018-10-21 DIAGNOSIS — E1169 Type 2 diabetes mellitus with other specified complication: Secondary | ICD-10-CM | POA: Diagnosis not present

## 2018-10-21 DIAGNOSIS — I10 Essential (primary) hypertension: Secondary | ICD-10-CM | POA: Diagnosis not present

## 2018-10-21 DIAGNOSIS — M109 Gout, unspecified: Secondary | ICD-10-CM | POA: Diagnosis not present

## 2018-10-21 DIAGNOSIS — M150 Primary generalized (osteo)arthritis: Secondary | ICD-10-CM | POA: Diagnosis not present

## 2018-10-21 DIAGNOSIS — E785 Hyperlipidemia, unspecified: Secondary | ICD-10-CM | POA: Diagnosis not present

## 2018-10-21 DIAGNOSIS — Z79899 Other long term (current) drug therapy: Secondary | ICD-10-CM | POA: Diagnosis not present

## 2018-10-21 DIAGNOSIS — Z6836 Body mass index (BMI) 36.0-36.9, adult: Secondary | ICD-10-CM | POA: Diagnosis not present

## 2018-10-21 DIAGNOSIS — K219 Gastro-esophageal reflux disease without esophagitis: Secondary | ICD-10-CM | POA: Diagnosis not present

## 2019-02-16 DIAGNOSIS — Z1331 Encounter for screening for depression: Secondary | ICD-10-CM | POA: Diagnosis not present

## 2019-02-16 DIAGNOSIS — I10 Essential (primary) hypertension: Secondary | ICD-10-CM | POA: Diagnosis not present

## 2019-02-16 DIAGNOSIS — M25473 Effusion, unspecified ankle: Secondary | ICD-10-CM | POA: Diagnosis not present

## 2019-02-16 DIAGNOSIS — Z0000 Encounter for general adult medical examination without abnormal findings: Secondary | ICD-10-CM | POA: Diagnosis not present

## 2019-02-16 DIAGNOSIS — Z1339 Encounter for screening examination for other mental health and behavioral disorders: Secondary | ICD-10-CM | POA: Diagnosis not present

## 2019-02-16 DIAGNOSIS — M8949 Other hypertrophic osteoarthropathy, multiple sites: Secondary | ICD-10-CM | POA: Diagnosis not present

## 2019-02-16 DIAGNOSIS — Z6841 Body Mass Index (BMI) 40.0 and over, adult: Secondary | ICD-10-CM | POA: Diagnosis not present

## 2019-02-16 DIAGNOSIS — Z7189 Other specified counseling: Secondary | ICD-10-CM | POA: Diagnosis not present

## 2019-03-01 DIAGNOSIS — I10 Essential (primary) hypertension: Secondary | ICD-10-CM | POA: Diagnosis not present

## 2019-03-01 DIAGNOSIS — E785 Hyperlipidemia, unspecified: Secondary | ICD-10-CM | POA: Diagnosis not present

## 2019-03-01 DIAGNOSIS — E1169 Type 2 diabetes mellitus with other specified complication: Secondary | ICD-10-CM | POA: Diagnosis not present

## 2019-03-01 DIAGNOSIS — Z79899 Other long term (current) drug therapy: Secondary | ICD-10-CM | POA: Diagnosis not present

## 2019-03-01 DIAGNOSIS — Z6841 Body Mass Index (BMI) 40.0 and over, adult: Secondary | ICD-10-CM | POA: Diagnosis not present

## 2019-03-30 DIAGNOSIS — M150 Primary generalized (osteo)arthritis: Secondary | ICD-10-CM | POA: Diagnosis not present

## 2019-03-30 DIAGNOSIS — E785 Hyperlipidemia, unspecified: Secondary | ICD-10-CM | POA: Diagnosis not present

## 2019-03-30 DIAGNOSIS — I10 Essential (primary) hypertension: Secondary | ICD-10-CM | POA: Diagnosis not present

## 2019-03-30 DIAGNOSIS — Z6841 Body Mass Index (BMI) 40.0 and over, adult: Secondary | ICD-10-CM | POA: Diagnosis not present

## 2019-03-30 DIAGNOSIS — K219 Gastro-esophageal reflux disease without esophagitis: Secondary | ICD-10-CM | POA: Diagnosis not present

## 2019-03-30 DIAGNOSIS — M109 Gout, unspecified: Secondary | ICD-10-CM | POA: Diagnosis not present

## 2019-03-30 DIAGNOSIS — M25512 Pain in left shoulder: Secondary | ICD-10-CM | POA: Diagnosis not present

## 2019-05-03 DIAGNOSIS — R0602 Shortness of breath: Secondary | ICD-10-CM | POA: Diagnosis not present

## 2019-06-23 DIAGNOSIS — Z6841 Body Mass Index (BMI) 40.0 and over, adult: Secondary | ICD-10-CM | POA: Diagnosis not present

## 2019-06-23 DIAGNOSIS — Z79899 Other long term (current) drug therapy: Secondary | ICD-10-CM | POA: Diagnosis not present

## 2019-06-23 DIAGNOSIS — Z1331 Encounter for screening for depression: Secondary | ICD-10-CM | POA: Diagnosis not present

## 2019-06-23 DIAGNOSIS — I10 Essential (primary) hypertension: Secondary | ICD-10-CM | POA: Diagnosis not present

## 2019-06-23 DIAGNOSIS — E1169 Type 2 diabetes mellitus with other specified complication: Secondary | ICD-10-CM | POA: Diagnosis not present

## 2019-06-23 DIAGNOSIS — R5383 Other fatigue: Secondary | ICD-10-CM | POA: Diagnosis not present

## 2019-06-23 DIAGNOSIS — E785 Hyperlipidemia, unspecified: Secondary | ICD-10-CM | POA: Diagnosis not present

## 2019-06-23 DIAGNOSIS — M7989 Other specified soft tissue disorders: Secondary | ICD-10-CM | POA: Diagnosis not present

## 2019-07-19 DIAGNOSIS — M7989 Other specified soft tissue disorders: Secondary | ICD-10-CM | POA: Diagnosis not present

## 2019-07-19 DIAGNOSIS — I82452 Acute embolism and thrombosis of left peroneal vein: Secondary | ICD-10-CM | POA: Diagnosis not present

## 2019-08-11 DIAGNOSIS — I10 Essential (primary) hypertension: Secondary | ICD-10-CM | POA: Diagnosis not present

## 2019-08-11 DIAGNOSIS — R600 Localized edema: Secondary | ICD-10-CM | POA: Diagnosis not present

## 2019-08-11 DIAGNOSIS — I701 Atherosclerosis of renal artery: Secondary | ICD-10-CM | POA: Diagnosis not present

## 2019-08-11 DIAGNOSIS — R7303 Prediabetes: Secondary | ICD-10-CM | POA: Diagnosis not present

## 2019-08-11 DIAGNOSIS — E785 Hyperlipidemia, unspecified: Secondary | ICD-10-CM | POA: Diagnosis not present

## 2019-08-11 DIAGNOSIS — I82452 Acute embolism and thrombosis of left peroneal vein: Secondary | ICD-10-CM | POA: Diagnosis not present

## 2019-08-11 DIAGNOSIS — R0601 Orthopnea: Secondary | ICD-10-CM | POA: Diagnosis not present

## 2019-08-11 DIAGNOSIS — I4891 Unspecified atrial fibrillation: Secondary | ICD-10-CM | POA: Diagnosis not present

## 2019-08-11 DIAGNOSIS — J302 Other seasonal allergic rhinitis: Secondary | ICD-10-CM | POA: Diagnosis not present

## 2019-08-15 DIAGNOSIS — R601 Generalized edema: Secondary | ICD-10-CM | POA: Diagnosis not present

## 2019-08-23 ENCOUNTER — Encounter: Payer: Self-pay | Admitting: Cardiology

## 2019-08-23 DIAGNOSIS — M1612 Unilateral primary osteoarthritis, left hip: Secondary | ICD-10-CM | POA: Insufficient documentation

## 2019-08-23 DIAGNOSIS — I4891 Unspecified atrial fibrillation: Secondary | ICD-10-CM | POA: Insufficient documentation

## 2019-08-23 DIAGNOSIS — F419 Anxiety disorder, unspecified: Secondary | ICD-10-CM | POA: Insufficient documentation

## 2019-08-23 DIAGNOSIS — K219 Gastro-esophageal reflux disease without esophagitis: Secondary | ICD-10-CM | POA: Insufficient documentation

## 2019-08-23 DIAGNOSIS — E785 Hyperlipidemia, unspecified: Secondary | ICD-10-CM | POA: Insufficient documentation

## 2019-08-23 DIAGNOSIS — M109 Gout, unspecified: Secondary | ICD-10-CM | POA: Insufficient documentation

## 2019-08-23 DIAGNOSIS — G4730 Sleep apnea, unspecified: Secondary | ICD-10-CM | POA: Insufficient documentation

## 2019-08-23 DIAGNOSIS — I10 Essential (primary) hypertension: Secondary | ICD-10-CM | POA: Insufficient documentation

## 2019-08-23 DIAGNOSIS — M169 Osteoarthritis of hip, unspecified: Secondary | ICD-10-CM | POA: Insufficient documentation

## 2019-08-23 DIAGNOSIS — E119 Type 2 diabetes mellitus without complications: Secondary | ICD-10-CM | POA: Insufficient documentation

## 2019-08-23 DIAGNOSIS — M1990 Unspecified osteoarthritis, unspecified site: Secondary | ICD-10-CM | POA: Insufficient documentation

## 2019-08-24 ENCOUNTER — Other Ambulatory Visit: Payer: Self-pay

## 2019-08-24 ENCOUNTER — Ambulatory Visit: Payer: Medicare HMO | Admitting: Cardiology

## 2019-08-24 ENCOUNTER — Encounter: Payer: Self-pay | Admitting: Cardiology

## 2019-08-24 VITALS — BP 110/70 | HR 78 | Ht 65.5 in | Wt 240.0 lb

## 2019-08-24 DIAGNOSIS — E669 Obesity, unspecified: Secondary | ICD-10-CM | POA: Diagnosis not present

## 2019-08-24 DIAGNOSIS — I4819 Other persistent atrial fibrillation: Secondary | ICD-10-CM

## 2019-08-24 DIAGNOSIS — Z79899 Other long term (current) drug therapy: Secondary | ICD-10-CM | POA: Diagnosis not present

## 2019-08-24 DIAGNOSIS — Z6839 Body mass index (BMI) 39.0-39.9, adult: Secondary | ICD-10-CM | POA: Diagnosis not present

## 2019-08-24 DIAGNOSIS — M8949 Other hypertrophic osteoarthropathy, multiple sites: Secondary | ICD-10-CM | POA: Diagnosis not present

## 2019-08-24 DIAGNOSIS — E782 Mixed hyperlipidemia: Secondary | ICD-10-CM | POA: Diagnosis not present

## 2019-08-24 DIAGNOSIS — Z7189 Other specified counseling: Secondary | ICD-10-CM | POA: Diagnosis not present

## 2019-08-24 DIAGNOSIS — R600 Localized edema: Secondary | ICD-10-CM | POA: Diagnosis not present

## 2019-08-24 DIAGNOSIS — E119 Type 2 diabetes mellitus without complications: Secondary | ICD-10-CM | POA: Diagnosis not present

## 2019-08-24 DIAGNOSIS — I10 Essential (primary) hypertension: Secondary | ICD-10-CM | POA: Diagnosis not present

## 2019-08-24 DIAGNOSIS — I4891 Unspecified atrial fibrillation: Secondary | ICD-10-CM | POA: Diagnosis not present

## 2019-08-24 HISTORY — DX: Obesity, unspecified: E66.9

## 2019-08-24 MED ORDER — FUROSEMIDE 40 MG PO TABS: 40.0000 mg | ORAL_TABLET | Freq: Every day | ORAL | 3 refills | Status: DC

## 2019-08-24 NOTE — Progress Notes (Signed)
Cardiology Office Note:    Date:  08/24/2019   ID:  Logan [name], DOB [birth year], MRN [id number]  PCP:  [name], MD  Cardiologist:  Berniece Salines, DO  Electrophysiologist:  None   Referring MD: [name], MD   Chief Complaint  Patient presents with  . Atrial Fibrillation    History of Present Illness:    Logan [name] is a 74 y.o. male with a hx of atrial fibrillation on Xarelto and carvedilol, hypertension, hyperlipidemia, prediabetes and morbid obesity presents today to be evaluated at the request of his primary care doctor.     Past Medical History:  Diagnosis Date  . Anxiety   . Atherosclerotic renal artery stenosis, unilateral ([place]) 02/28/2014  . Atrial fibrillation ([place])   . Bilateral lower extremity edema   . Diabetes mellitus without complication ([place])   . DVT (deep venous thrombosis) ([place])   . Esophageal reflux   . Gout   . Hyperlipidemia   . Hypertension   . Morbid obesity ([place])   . Orthopnea   . Osteoarthritis   . Seasonal allergies   . Sleep apnea     Past Surgical History:  Procedure Laterality Date  . ABDOMINAL AORTAGRAM N/A 02/28/2014   Procedure: ABDOMINAL AORTAGRAM;  Surgeon: Serafina Mitchell, MD;  Location: [facility] CATH LAB;  Service: Cardiovascular;  Laterality: N/A;  . APPENDECTOMY    . CARPAL TUNNEL RELEASE Left     Current Medications: Current Meds  Medication Sig  . allopurinol (ZYLOPRIM) 300 MG tablet Take 300 mg by mouth daily.  Marland Kitchen aspirin EC 81 MG tablet Take 81 mg by mouth daily.  . carvedilol (COREG) 12.5 MG tablet Take 12.5 mg by mouth 2 (two) times daily with a meal.  . Cholecalciferol (VITAMIN D-3) 1000 UNITS CAPS Take 1,000 Units by mouth daily.  Marland Kitchen DHEA 25 MG CAPS Take 25 mg by mouth daily.  Marland Kitchen doxazosin (CARDURA) 8 MG tablet 8 mg daily.  . fluticasone (FLONASE) 50 MCG/ACT nasal spray Place 1 spray into both nostrils daily.  . furosemide (LASIX) 40 MG tablet Take 1 tablet (40 mg total) by mouth daily.  . hydrALAZINE  (APRESOLINE) 25 MG tablet Take 25 mg by mouth in the morning and at bedtime.   Marland Kitchen ketoconazole (NIZORAL) 2 % cream Apply 1 application topically daily as needed for irritation.   Marland Kitchen loratadine (CLARITIN) 10 MG tablet Take 10 mg by mouth daily as needed for allergies.  Marland Kitchen losartan (COZAAR) 100 MG tablet Take 100 mg by mouth daily.  . meloxicam (MOBIC) 7.5 MG tablet 7.5 mg 2 (two) times daily as needed.  . nystatin-triamcinolone (MYCOLOG II) cream Apply 1 application topically 2 (two) times daily.  Marland Kitchen omeprazole (PRILOSEC) 20 MG capsule Take 20 mg by mouth daily as needed (for heartburn).   . pravastatin (PRAVACHOL) 20 MG tablet 20 mg daily.  . rivaroxaban (XARELTO) 20 MG TABS tablet Take 20 mg by mouth daily with supper.  . traMADol (ULTRAM) 50 MG tablet 50 mg daily as needed.  . vitamin B-12 (CYANOCOBALAMIN) 1000 MCG tablet Take 1,000 mcg by mouth daily.  . [DISCONTINUED] furosemide (LASIX) 40 MG tablet 40 mg daily as needed.     Allergies:   Patient has no known allergies.   Social History   Socioeconomic History  . Marital status: Widowed    Spouse name: Not on file  . Number of children: Not on file  . Years of education: Not on file  . Highest education level: Not on  file  Occupational History  . Not on file  Tobacco Use  . Smoking status: Former Smoker    Quit date: 02/07/1997    Years since quitting: 22.5  . Smokeless tobacco: Current User    Types: Chew  Substance and Sexual Activity  . Alcohol use: Never  . Drug use: Never  . Sexual activity: Not on file  Other Topics Concern  . Not on file  Social History Narrative  . Not on file   Social Determinants of Health   Financial Resource Strain:   . Difficulty of Paying Living Expenses:   Food Insecurity:   . Worried About Charity fundraiser in the Last Year:   . Arboriculturist in the Last Year:   Transportation Needs:   . Film/video editor (Medical):   Marland Kitchen Lack of Transportation (Non-Medical):   Physical  Activity:   . Days of Exercise per Week:   . Minutes of Exercise per Session:   Stress:   . Feeling of Stress :   Social Connections:   . Frequency of Communication with Friends and Family:   . Frequency of Social Gatherings with Friends and Family:   . Attends Religious Services:   . Active Member of Clubs or Organizations:   . Attends Archivist Meetings:   Marland Kitchen Marital Status:      Family History: The patient's family history includes Cancer in his father; Heart disease in his mother.  ROS:   Review of Systems  Constitution: Negative for decreased appetite, fever and weight gain.  HENT: Negative for congestion, ear discharge, hoarse voice and sore throat.   Eyes: Negative for discharge, redness, vision loss in right eye and visual halos.  Cardiovascular: Negative for chest pain, dyspnea on exertion, leg swelling, orthopnea and palpitations.  Respiratory: Negative for cough, hemoptysis, shortness of breath and snoring.   Endocrine: Negative for heat intolerance and polyphagia.  Hematologic/Lymphatic: Negative for bleeding problem. Does not bruise/bleed easily.  Skin: Negative for flushing, nail changes, rash and suspicious lesions.  Musculoskeletal: Negative for arthritis, joint pain, muscle cramps, myalgias, neck pain and stiffness.  Gastrointestinal: Negative for abdominal pain, bowel incontinence, diarrhea and excessive appetite.  Genitourinary: Negative for decreased libido, genital sores and incomplete emptying.  Neurological: Negative for brief paralysis, focal weakness, headaches and loss of balance.  Psychiatric/Behavioral: Negative for altered mental status, depression and suicidal ideas.  Allergic/Immunologic: Negative for HIV exposure and persistent infections.    EKGs/Labs/Other Studies Reviewed:    The following studies were reviewed today:   EKG:  The ekg ordered today demonstrates atrial fibrillation, heart rate 72 bpm with poor wave progression could  be suggestive of septal infarction.   Recent Labs: No results found for requested labs within last 8760 hours.  Recent Lipid Panel No results found for: CHOL, TRIG, HDL, CHOLHDL, VLDL, LDLCALC, LDLDIRECT  Physical Exam:    VS:  BP 110/70 (BP Location: Right Arm, Patient Position: Sitting, Cuff Size: Normal)   Pulse 78   Ht 5' 5.5" (1.664 m)   Wt 240 lb (108.9 kg)   SpO2 92%   BMI 39.33 kg/m     Wt Readings from Last 3 Encounters:  08/24/19 240 lb (108.9 kg)  08/11/19 253 lb (114.8 kg)  02/28/14 208 lb (94.3 kg)     GEN: Well nourished, well developed in no acute distress HEENT: Normal NECK: No JVD; No carotid bruits LYMPHATICS: No lymphadenopathy CARDIAC: S1S2 noted,RRR, no murmurs, rubs, gallops RESPIRATORY:  Clear  to auscultation without rales, wheezing or rhonchi  ABDOMEN: Soft, non-tender, non-distended, +bowel sounds, no guarding. EXTREMITIES: +1 bilateral leg edema, No cyanosis, no clubbing MUSCULOSKELETAL:  No deformity  SKIN: Warm and dry NEUROLOGIC:  Alert and oriented x 3, non-focal PSYCHIATRIC:  Normal affect, good insight  ASSESSMENT:    1. Persistent atrial fibrillation ([place])   2. Essential hypertension   3. Diabetes mellitus without complication ([place])   4. Mixed hyperlipidemia   5. Obesity (BMI 30-39.9)   6. Bilateral leg edema    PLAN:    He is in atrial fibrillation today.  But he is asymptomatic.  His rate is controlled.  The plan today is to continue his rate control strategy until we can understand likely what his left ventricle systolic function is.  He is also is on Xarelto 20 mg daily, I will continue this for now.  Once we are able to understand his left ventricular systolic function as he continues on anticoagulation my plan is to hopefully have him undergo synchronized DC cardioversion.  And then possible antiarrhythmics.  In addition I believe he is planning a colonoscopy which I would want to perform his cardioversion after his colonoscopy  given the fact that I would want to interrupt his anticoagulation for 4 weeks after he has gotten his cardioversion  He does have bilateral leg edema, he has Lasix he takes at home as needed have asked the patient to try to take this daily to hopefully help with his symptoms.  Hypertension-his blood pressure is acceptable in the office today no change will be made to his antihypertensive regimen.  Diabetes mellitus-he tells me he is not on any medications for this he is diet controlled. I do have his hemoglobin A1c to review to make any further recommendations at this time.  Hyperlipidemia-continue the patient on his pravastatin 20 mg daily.  Obesity-the patient understands the need to lose weight with diet and exercise. We have discussed specific strategies for this.  He recently had blood work with his PCP wishes not to repeat these therefore we will get a request his lab work results for my review.  The patient is in agreement with the above plan. The patient left the office in stable condition.  The patient will follow up in 8 weeks or sooner if needed   Medication Adjustments/Labs and Tests Ordered: Current medicines are reviewed at length with the patient today.  Concerns regarding medicines are outlined above.  Orders Placed This Encounter  Procedures  . EKG 12-Lead  . ECHOCARDIOGRAM COMPLETE   Meds ordered this encounter  Medications  . furosemide (LASIX) 40 MG tablet    Sig: Take 1 tablet (40 mg total) by mouth daily.    Dispense:  90 tablet    Refill:  3    Patient Instructions  Medication Instructions:  Increase Lasix to 40 mg daily   *If you need a refill on your cardiac medications before your next appointment, please call your pharmacy*   Lab Work: None ordered   If you have labs (blood work) drawn today and your tests are completely normal, you will receive your results only by: Marland Kitchen MyChart Message (if you have MyChart) OR . A paper copy in the mail If you  have any lab test that is abnormal or we need to change your treatment, we will call you to review the results.   Testing/Procedures: Your physician has requested that you have an echocardiogram. Echocardiography is a painless test that uses sound  waves to create images of your heart. It provides your doctor with information about the size and shape of your heart and how well your heart's chambers and valves are working. This procedure takes approximately one hour. There are no restrictions for this procedure.     Follow-Up: At [facility], you and your health needs are our priority.  As part of our continuing mission to provide you with exceptional heart care, we have created designated Provider Care Teams.  These Care Teams include your primary Cardiologist (physician) and Advanced Practice Providers (APPs -  Physician Assistants and Nurse Practitioners) who all work together to provide you with the care you need, when you need it.  We recommend signing up for the patient portal called "MyChart".  Sign up information is provided on this After Visit Summary.  MyChart is used to connect with patients for Virtual Visits (Telemedicine).  Patients are able to view lab/test results, encounter notes, upcoming appointments, etc.  Non-urgent messages can be sent to your provider as well.   To learn more about what you can do with MyChart, go to [email or web address].    Your next appointment:   8 week(s)  The format for your next appointment:   In Person  Provider:   Berniece Salines, DO   Other Instructions None      Adopting a Healthy Lifestyle.  Know what a healthy weight is for you (roughly BMI <25) and aim to maintain this   Aim for 7+ servings of fruits and vegetables daily   65-80+ fluid ounces of water or unsweet tea for healthy kidneys   Limit to max 1 drink of alcohol per day; avoid smoking/tobacco   Limit animal fats in diet for cholesterol and heart health - choose grass  fed whenever available   Avoid highly processed foods, and foods high in saturated/trans fats   Aim for low stress - take time to unwind and care for your mental health   Aim for 150 min of moderate intensity exercise weekly for heart health, and weights twice weekly for bone health   Aim for 7-9 hours of sleep daily   When it comes to diets, agreement about the perfect plan isnt easy to find, even among the experts. Experts at the [employer] developed an idea known as the Healthy Eating Plate. Just imagine a plate divided into logical, healthy portions.   The emphasis is on diet quality:   Load up on vegetables and fruits - one-half of your plate: Aim for color and variety, and remember that potatoes dont count.   Go for whole grains - one-quarter of your plate: Whole wheat, barley, wheat berries, quinoa, oats, brown rice, and foods made with them. If you want pasta, go with whole wheat pasta.   Protein power - one-quarter of your plate: Fish, chicken, beans, and nuts are all healthy, versatile protein sources. Limit red meat.   The diet, however, does go beyond the plate, offering a few other suggestions.   Use healthy plant oils, such as olive, canola, soy, corn, sunflower and peanut. Check the labels, and avoid partially hydrogenated oil, which have unhealthy trans fats.   If youre thirsty, drink water. Coffee and tea are good in moderation, but skip sugary drinks and limit milk and dairy products to one or two daily servings.   The type of carbohydrate in the diet is more important than the amount. Some sources of carbohydrates, such as vegetables, fruits, whole grains, and beans-are  healthier than others.   Finally, stay active  Signed, Berniece Salines, DO  08/24/2019 4:03 PM    [facility]

## 2019-08-24 NOTE — Patient Instructions (Signed)
Medication Instructions:  Increase Lasix to 40 mg daily   *If you need a refill on your cardiac medications before your next appointment, please call your pharmacy*   Lab Work: None ordered   If you have labs (blood work) drawn today and your tests are completely normal, you will receive your results only by: Marland Kitchen MyChart Message (if you have MyChart) OR . A paper copy in the mail If you have any lab test that is abnormal or we need to change your treatment, we will call you to review the results.   Testing/Procedures: Your physician has requested that you have an echocardiogram. Echocardiography is a painless test that uses sound waves to create images of your heart. It provides your doctor with information about the size and shape of your heart and how well your heart's chambers and valves are working. This procedure takes approximately one hour. There are no restrictions for this procedure.     Follow-Up: At [facility], you and your health needs are our priority.  As part of our continuing mission to provide you with exceptional heart care, we have created designated Provider Care Teams.  These Care Teams include your primary Cardiologist (physician) and Advanced Practice Providers (APPs -  Physician Assistants and Nurse Practitioners) who all work together to provide you with the care you need, when you need it.  We recommend signing up for the patient portal called "MyChart".  Sign up information is provided on this After Visit Summary.  MyChart is used to connect with patients for Virtual Visits (Telemedicine).  Patients are able to view lab/test results, encounter notes, upcoming appointments, etc.  Non-urgent messages can be sent to your provider as well.   To learn more about what you can do with MyChart, go to [email or web address].    Your next appointment:   8 week(s)  The format for your next appointment:   In Person  Provider:   Berniece Salines, DO   Other  Instructions None

## 2019-09-08 DIAGNOSIS — Z79899 Other long term (current) drug therapy: Secondary | ICD-10-CM | POA: Diagnosis not present

## 2019-09-13 ENCOUNTER — Ambulatory Visit (INDEPENDENT_AMBULATORY_CARE_PROVIDER_SITE_OTHER): Payer: Medicare HMO

## 2019-09-13 ENCOUNTER — Other Ambulatory Visit: Payer: Self-pay

## 2019-09-13 DIAGNOSIS — I4819 Other persistent atrial fibrillation: Secondary | ICD-10-CM

## 2019-09-13 NOTE — Progress Notes (Signed)
2D echocardiogram has been performed. 

## 2019-09-14 ENCOUNTER — Telehealth: Payer: Self-pay

## 2019-09-14 NOTE — Telephone Encounter (Signed)
-----   Message from Berniece Salines, DO sent at 09/14/2019  8:17 AM EDT ----- Your echo showed a normal pumping function.  The left ventricle walls are mildly thickened and they do not relax well.  This is to be expected in the setting of hypertension.  Our goal is to continue to keep your blood pressure less than 130/80.

## 2019-09-14 NOTE — Telephone Encounter (Signed)
Spoke with patient regarding results and recommendation.  Patient verbalizes understanding and is agreeable to plan of care. Advised patient to call back with any issues or concerns.  

## 2019-10-10 ENCOUNTER — Other Ambulatory Visit: Payer: Self-pay

## 2019-10-10 MED ORDER — RIVAROXABAN 20 MG PO TABS: 20.0000 mg | ORAL_TABLET | Freq: Every day | ORAL | 6 refills | Status: DC

## 2019-10-21 ENCOUNTER — Ambulatory Visit: Payer: Medicare HMO | Admitting: Cardiology

## 2019-10-21 ENCOUNTER — Encounter: Payer: Self-pay | Admitting: Cardiology

## 2019-10-21 ENCOUNTER — Other Ambulatory Visit: Payer: Self-pay

## 2019-10-21 VITALS — BP 132/60 | HR 96 | Ht 65.5 in | Wt 228.6 lb

## 2019-10-21 DIAGNOSIS — I10 Essential (primary) hypertension: Secondary | ICD-10-CM | POA: Diagnosis not present

## 2019-10-21 DIAGNOSIS — G4733 Obstructive sleep apnea (adult) (pediatric): Secondary | ICD-10-CM

## 2019-10-21 DIAGNOSIS — E119 Type 2 diabetes mellitus without complications: Secondary | ICD-10-CM

## 2019-10-21 DIAGNOSIS — E782 Mixed hyperlipidemia: Secondary | ICD-10-CM

## 2019-10-21 DIAGNOSIS — I4819 Other persistent atrial fibrillation: Secondary | ICD-10-CM

## 2019-10-21 DIAGNOSIS — E669 Obesity, unspecified: Secondary | ICD-10-CM | POA: Diagnosis not present

## 2019-10-21 DIAGNOSIS — I5032 Chronic diastolic (congestive) heart failure: Secondary | ICD-10-CM | POA: Diagnosis not present

## 2019-10-21 HISTORY — DX: Essential (primary) hypertension: I10

## 2019-10-21 HISTORY — DX: Chronic diastolic (congestive) heart failure: I50.32

## 2019-10-21 HISTORY — DX: Other persistent atrial fibrillation: I48.19

## 2019-10-21 HISTORY — DX: Mixed hyperlipidemia: E78.2

## 2019-10-21 MED ORDER — POTASSIUM CHLORIDE CRYS ER 20 MEQ PO TBCR: 20.0000 meq | EXTENDED_RELEASE_TABLET | Freq: Every day | ORAL | 1 refills | Status: DC

## 2019-10-21 NOTE — Patient Instructions (Addendum)
Medication Instructions:    1. TAKE  LASIX 40 MG TWICE A DAY FOR ONE WEEK ONLY:    2. THEN   RESUME BACK TO  ONCE A DAY    3. START POTASSIUM 20 MEQ DAILY   *If you need a refill on your cardiac medications before your next appointment, please call your pharmacy*   Lab Work: BMET AND Star   If you have labs (blood work) drawn today and your tests are completely normal, you will receive your results only by: Marland Kitchen MyChart Message (if you have MyChart) OR . A paper copy in the mail If you have any lab test that is abnormal or we need to change your treatment, we will call you to review the results.   Testing/Procedures: SOMEONE WILL CONTACT YOU BACK FOR SCHEDULING Your physician has recommended that you have a Cardioversion (DCCV). Electrical Cardioversion uses a jolt of electricity to your heart either through paddles or wired patches attached to your chest. This is a controlled, usually prescheduled, procedure. Defibrillation is done under light anesthesia in the hospital, and you usually go home the day of the procedure. This is done to get your heart back into a normal rhythm. You are not awake for the procedure. Please see the instruction sheet given to you today.   Follow-Up: At [facility], you and your health needs are our priority.  As part of our continuing mission to provide you with exceptional heart care, we have created designated Provider Care Teams.  These Care Teams include your primary Cardiologist (physician) and Advanced Practice Providers (APPs -  Physician Assistants and Nurse Practitioners) who all work together to provide you with the care you need, when you need it.  We recommend signing up for the patient portal called "MyChart".  Sign up information is provided on this After Visit Summary.  MyChart is used to connect with patients for Virtual Visits (Telemedicine).  Patients are able to view lab/test results, encounter notes, upcoming appointments, etc.   Non-urgent messages can be sent to your provider as well.   To learn more about what you can do with MyChart, go to [email or web address].    Your next appointment:   1 month(s)  The format for your next appointment:   In Person  Provider:   You will see Berniece Salines, DO.  Or, you can be scheduled with the following Advanced Practice Provider on your designated Care Team (at our [facility]):  Laurann Montana, FNP     Other Instructions  Dear Logan [name]  (patient name) You will be contacted back to be scheduled for Cardioversion and arrival time   DIET: Nothing to eat or drink after midnight except a sip of water with medications (see medication instructions below)  Medication Instructions: Hold diuretic medications ( Hydralazine and Furosemide)  the morning of procedure Take all other Morning medications especially Xarelto  Labs: BMET and  MAG  to be completed at [facility] clinic 10-21-2019   [place] TO [place] FAXED TO [place]   ([place] A PHONE [place])  You must have a responsible person to drive you home and stay in the waiting area during your procedure. Failure to do so could result in cancellation.  Bring your insurance cards.  *Special Note: Every effort is made to have your procedure  done on time. Occasionally there are emergencies that occur at the hospital that may cause delays. Please be patient if a delay does occur.

## 2019-10-21 NOTE — Progress Notes (Signed)
Cardiology Office Note:    Date:  10/21/2019   ID:  Logan [name], DOB 18-Mar-1946, MRN [id number]  PCP:  [name], MD  Cardiologist:  Berniece Salines, DO  Electrophysiologist:  None   Referring MD: [name], MD   "I am having shortness of breath and leg swelling"  History of Present Illness:    Logan [name] is a 74 y.o. male with a hx of atrial fibrillation on Xarelto and carvedilol, hypertension, hyperlipidemia, prediabetes and morbid obesity.    He is here today for a follow up visit. The patient tells me that he has been experiencing increasing bilateral leg edema. He also has been fatigue with some shortness of breath.    He was unable to get his colonoscopy.   Past Medical History:  Diagnosis Date  . Anxiety   . Atherosclerotic renal artery stenosis, unilateral ([place]) 02/28/2014  . Atrial fibrillation ([place])   . Bilateral lower extremity edema   . Diabetes mellitus without complication ([place])   . DVT (deep venous thrombosis) ([place])   . Esophageal reflux   . Gout   . Hyperlipidemia   . Hypertension   . Morbid obesity ([place])   . Orthopnea   . Osteoarthritis   . Seasonal allergies   . Sleep apnea     Past Surgical History:  Procedure Laterality Date  . ABDOMINAL AORTAGRAM N/A 02/28/2014   Procedure: ABDOMINAL AORTAGRAM;  Surgeon: Serafina Mitchell, MD;  Location: [facility] CATH LAB;  Service: Cardiovascular;  Laterality: N/A;  . APPENDECTOMY    . CARPAL TUNNEL RELEASE Left     Current Medications: Current Meds  Medication Sig  . allopurinol (ZYLOPRIM) 300 MG tablet Take 300 mg by mouth daily.  Marland Kitchen aspirin EC 81 MG tablet Take 81 mg by mouth daily.  . carvedilol (COREG) 12.5 MG tablet Take 12.5 mg by mouth 2 (two) times daily with a meal.  . Cholecalciferol (VITAMIN D-3) 1000 UNITS CAPS Take 1,000 Units by mouth daily.  Marland Kitchen DHEA 25 MG CAPS Take 25 mg by mouth daily.  Marland Kitchen doxazosin (CARDURA) 8 MG tablet 8 mg daily.  . fluticasone (FLONASE) 50 MCG/ACT nasal spray Place  1 spray into both nostrils daily.  . furosemide (LASIX) 40 MG tablet Take 1 tablet (40 mg total) by mouth daily.  . hydrALAZINE (APRESOLINE) 25 MG tablet Take 25 mg by mouth in the morning and at bedtime.   Marland Kitchen ketoconazole (NIZORAL) 2 % cream Apply 1 application topically daily as needed for irritation.   Marland Kitchen loratadine (CLARITIN) 10 MG tablet Take 10 mg by mouth daily as needed for allergies.  Marland Kitchen losartan (COZAAR) 100 MG tablet Take 100 mg by mouth daily.  . meloxicam (MOBIC) 7.5 MG tablet 7.5 mg 2 (two) times daily as needed.  . nystatin-triamcinolone (MYCOLOG II) cream Apply 1 application topically 2 (two) times daily.  Marland Kitchen omeprazole (PRILOSEC) 20 MG capsule Take 20 mg by mouth daily as needed (for heartburn).   . pravastatin (PRAVACHOL) 20 MG tablet 20 mg daily.  . rivaroxaban (XARELTO) 20 MG TABS tablet Take 1 tablet (20 mg total) by mouth daily with supper.  . traMADol (ULTRAM) 50 MG tablet 50 mg daily as needed.  . vitamin B-12 (CYANOCOBALAMIN) 1000 MCG tablet Take 1,000 mcg by mouth daily.     Allergies:   Patient has no known allergies.   Social History   Socioeconomic History  . Marital status: Widowed    Spouse name: Not on file  . Number of children: Not  on file  . Years of education: Not on file  . Highest education level: Not on file  Occupational History  . Not on file  Tobacco Use  . Smoking status: Former Smoker    Quit date: 02/07/1997    Years since quitting: 22.7  . Smokeless tobacco: Current User    Types: Chew  Substance and Sexual Activity  . Alcohol use: Never  . Drug use: Never  . Sexual activity: Not on file  Other Topics Concern  . Not on file  Social History Narrative  . Not on file   Social Determinants of Health   Financial Resource Strain:   . Difficulty of Paying Living Expenses:   Food Insecurity:   . Worried About Charity fundraiser in the Last Year:   . Arboriculturist in the Last Year:   Transportation Needs:   . Film/video editor  (Medical):   Marland Kitchen Lack of Transportation (Non-Medical):   Physical Activity:   . Days of Exercise per Week:   . Minutes of Exercise per Session:   Stress:   . Feeling of Stress :   Social Connections:   . Frequency of Communication with Friends and Family:   . Frequency of Social Gatherings with Friends and Family:   . Attends Religious Services:   . Active Member of Clubs or Organizations:   . Attends Archivist Meetings:   Marland Kitchen Marital Status:      Family History: The patient's family history includes Cancer in his father; Heart disease in his mother.  ROS:   Review of Systems  Constitution:.  He reports fatigue.  negative for decreased appetite, fever and weight gain.  HENT: Negative for congestion, ear discharge, hoarse voice and sore throat.   Eyes: Negative for discharge, redness, vision loss in right eye and visual halos.  Cardiovascular: Negative for chest pain, dyspnea on exertion, leg swelling, orthopnea and palpitations.  Respiratory: Negative for cough, hemoptysis, shortness of breath and snoring.   Endocrine: Negative for heat intolerance and polyphagia.  Hematologic/Lymphatic: Negative for bleeding problem. Does not bruise/bleed easily.  Skin: Negative for flushing, nail changes, rash and suspicious lesions.  Musculoskeletal: Negative for arthritis, joint pain, muscle cramps, myalgias, neck pain and stiffness.  Gastrointestinal: Negative for abdominal pain, bowel incontinence, diarrhea and excessive appetite.  Genitourinary: Negative for decreased libido, genital sores and incomplete emptying.  Neurological: Negative for brief paralysis, focal weakness, headaches and loss of balance.  Psychiatric/Behavioral: Negative for altered mental status, depression and suicidal ideas.  Allergic/Immunologic: Negative for HIV exposure and persistent infections.    EKGs/Labs/Other Studies Reviewed:    The following studies were reviewed today:  EKG:  The ekg ordered today  demonstrates atrial fibrillation, heart rate 82 bpm low voltage in the limb leads poor progression concerning for septal infarction.  Echo IMPRESSIONS  1. Left ventricular ejection fraction, by estimation, is 55 to 60%. The left ventricle has normal function. The left ventricle has no regional  wall motion abnormalities. There is mild concentric left ventricular hypertrophy. Left ventricular diastolic  parameters are indeterminate.  2. Right ventricular systolic function is mildly reduced. The right ventricular size is mildly enlarged. Mildly increased right ventricular  wall thickness. There is normal pulmonary artery systolic pressure. 3. Right atrial size was severely dilated.  4. The mitral valve is normal in structure. No evidence of mitral valve regurgitation. No evidence of mitral stenosis.  5. The aortic valve is tricuspid. Aortic valve regurgitation is mild. No  aortic stenosis is present.  6. The inferior vena cava is dilated in size with >50% respiratory variability, suggesting right atrial pressure of 8 mmHg.   Recent Labs: No results found for requested labs within last 8760 hours.  Recent Lipid Panel No results found for: CHOL, TRIG, HDL, CHOLHDL, VLDL, LDLCALC, LDLDIRECT  Physical Exam:    VS:  BP 132/60   Pulse 96   Ht 5' 5.5" (1.664 m)   Wt 228 lb 9.6 oz (103.7 kg)   SpO2 95%   BMI 37.46 kg/m     Wt Readings from Last 3 Encounters:  10/21/19 228 lb 9.6 oz (103.7 kg)  08/24/19 240 lb (108.9 kg)  08/11/19 253 lb (114.8 kg)     GEN: Well nourished, well developed in no acute distress HEENT: Normal NECK: No JVD; No carotid bruits LYMPHATICS: No lymphadenopathy CARDIAC: S1S2 noted,RRR, no murmurs, rubs, gallops RESPIRATORY:  Clear to auscultation without rales, wheezing or rhonchi  ABDOMEN: Soft, non-tender, non-distended, +bowel sounds, no guarding. EXTREMITIES: No edema, No cyanosis, no clubbing MUSCULOSKELETAL:  No deformity  SKIN: Warm and  dry NEUROLOGIC:  Alert and oriented x 3, non-focal PSYCHIATRIC:  Normal affect, good insight  ASSESSMENT:    1. Persistent atrial fibrillation ([place])   2. Essential hypertension   3. Chronic diastolic heart failure ([place])   4. Mixed hyperlipidemia   5. Obesity (BMI 30-39.9)   6. Obstructive sleep apnea syndrome   7. Diabetes mellitus without complication ([place])    PLAN:     1. He does have increasing bilateral edema on his Lasix 40mg  daily. I will increase the Lasix to 40mg  BID for 1 week, he will also be started on potassium supplement. Blood work will be done today.  2. I spoke with the patient that his afib may be leading to his symptoms. I think it will be beneficial to move forward with his DCCV. I explained to him that he will need to continue his xeralto without any interruption. He expresses understanding. So we are going to have the patient set up for DCCV at [facility]. I plan to start the patient on antiarrhythmic post cardioversion  3.  Hyperlipidemia, will continue patient on his current statin.  4.  Hypertension-blood pressure is good in the office today no changes will be made.  5.  OSA-continue current management current  7.  Diabetes mellitus continue current medication regimen.  8.  Obesity-the patient understands the need to lose weight with diet and exercise. We have discussed specific strategies for this.   The patient is in agreement with the above plan. The patient left the office in stable condition.  The patient will follow up in a month.   Medication Adjustments/Labs and Tests Ordered: Current medicines are reviewed at length with the patient today.  Concerns regarding medicines are outlined above.  No orders of the defined types were placed in this encounter.  No orders of the defined types were placed in this encounter.   There are no Patient Instructions on file for this visit.   Adopting a Healthy Lifestyle.  Know what a healthy weight is  for you (roughly BMI <25) and aim to maintain this   Aim for 7+ servings of fruits and vegetables daily   65-80+ fluid ounces of water or unsweet tea for healthy kidneys   Limit to max 1 drink of alcohol per day; avoid smoking/tobacco   Limit animal fats in diet for cholesterol and heart health - choose grass fed whenever  available   Avoid highly processed foods, and foods high in saturated/trans fats   Aim for low stress - take time to unwind and care for your mental health   Aim for 150 min of moderate intensity exercise weekly for heart health, and weights twice weekly for bone health   Aim for 7-9 hours of sleep daily   When it comes to diets, agreement about the perfect plan isnt easy to find, even among the experts. Experts at the [employer] developed an idea known as the Healthy Eating Plate. Just imagine a plate divided into logical, healthy portions.   The emphasis is on diet quality:   Load up on vegetables and fruits - one-half of your plate: Aim for color and variety, and remember that potatoes dont count.   Go for whole grains - one-quarter of your plate: Whole wheat, barley, wheat berries, quinoa, oats, brown rice, and foods made with them. If you want pasta, go with whole wheat pasta.   Protein power - one-quarter of your plate: Fish, chicken, beans, and nuts are all healthy, versatile protein sources. Limit red meat.   The diet, however, does go beyond the plate, offering a few other suggestions.   Use healthy plant oils, such as olive, canola, soy, corn, sunflower and peanut. Check the labels, and avoid partially hydrogenated oil, which have unhealthy trans fats.   If youre thirsty, drink water. Coffee and tea are good in moderation, but skip sugary drinks and limit milk and dairy products to one or two daily servings.   The type of carbohydrate in the diet is more important than the amount. Some sources of carbohydrates, such as vegetables,  fruits, whole grains, and beans-are healthier than others.   Finally, stay active  Signed, Berniece Salines, DO  10/21/2019 3:49 PM    [facility]

## 2019-10-22 LAB — MAGNESIUM: Magnesium: 1.9 mg/dL (ref 1.6–2.3)

## 2019-10-22 LAB — BASIC METABOLIC PANEL
BUN/Creatinine Ratio: 19 (ref 10–24)
BUN: 19 mg/dL (ref 8–27)
CO2: 26 mmol/L (ref 20–29)
Calcium: 9.5 mg/dL (ref 8.6–10.2)
Chloride: 104 mmol/L (ref 96–106)
Creatinine, Ser: 1.01 mg/dL (ref 0.76–1.27)
GFR calc Af Amer: 86 mL/min/{1.73_m2} (ref >59)
GFR calc non Af Amer: 74 mL/min/{1.73_m2} (ref >59)
Glucose: 80 mg/dL (ref 65–99)
Potassium: 3.8 mmol/L (ref 3.5–5.2)
Sodium: 145 mmol/L — ABNORMAL HIGH (ref 134–144)

## 2019-10-25 ENCOUNTER — Telehealth: Payer: Self-pay

## 2019-10-25 NOTE — Telephone Encounter (Signed)
-----   Message from Berniece Salines, DO sent at 10/22/2019  4:31 PM EDT ----- Sodium slightly elevated. Suspect dehydration . Will continue to monitor. Continue with proper fluid intake

## 2019-10-25 NOTE — Telephone Encounter (Signed)
Spoke with patient regarding results and recommendation.  Patient verbalizes understanding and is agreeable to plan of care. Advised patient to call back with any issues or concerns.  

## 2019-10-26 ENCOUNTER — Telehealth: Payer: Self-pay

## 2019-10-26 NOTE — Telephone Encounter (Signed)
Spoke with the patient just now and let him know that we got his cardioversion scheduled for 10/31/19. I let him know that they would call him him on Friday to let him know an exact time. I also let him know that he would need to get his COVID swab completed tomorrow and bring the results in to me to fax by Friday at 1pm. He verbalizes understanding and states that he will bring me the COVID results tomorrow as soon as he gets it done. No other issues or concerns were noted at this time.    Encouraged patient to call back with any questions or concerns.

## 2019-10-27 DIAGNOSIS — J3489 Other specified disorders of nose and nasal sinuses: Secondary | ICD-10-CM | POA: Diagnosis not present

## 2019-10-27 DIAGNOSIS — Z20828 Contact with and (suspected) exposure to other viral communicable diseases: Secondary | ICD-10-CM | POA: Diagnosis not present

## 2019-10-31 DIAGNOSIS — Z7982 Long term (current) use of aspirin: Secondary | ICD-10-CM | POA: Diagnosis not present

## 2019-10-31 DIAGNOSIS — M1990 Unspecified osteoarthritis, unspecified site: Secondary | ICD-10-CM | POA: Diagnosis not present

## 2019-10-31 DIAGNOSIS — Z79899 Other long term (current) drug therapy: Secondary | ICD-10-CM | POA: Diagnosis not present

## 2019-10-31 DIAGNOSIS — I4891 Unspecified atrial fibrillation: Secondary | ICD-10-CM | POA: Diagnosis not present

## 2019-10-31 DIAGNOSIS — Z87891 Personal history of nicotine dependence: Secondary | ICD-10-CM | POA: Diagnosis not present

## 2019-10-31 DIAGNOSIS — E785 Hyperlipidemia, unspecified: Secondary | ICD-10-CM | POA: Diagnosis not present

## 2019-10-31 DIAGNOSIS — G4730 Sleep apnea, unspecified: Secondary | ICD-10-CM | POA: Diagnosis not present

## 2019-10-31 DIAGNOSIS — I4819 Other persistent atrial fibrillation: Secondary | ICD-10-CM | POA: Diagnosis not present

## 2019-10-31 DIAGNOSIS — I10 Essential (primary) hypertension: Secondary | ICD-10-CM | POA: Diagnosis not present

## 2019-11-21 ENCOUNTER — Encounter: Payer: Self-pay | Admitting: Cardiology

## 2019-11-21 ENCOUNTER — Other Ambulatory Visit: Payer: Self-pay

## 2019-11-21 ENCOUNTER — Ambulatory Visit: Payer: Medicare HMO | Admitting: Cardiology

## 2019-11-21 VITALS — BP 136/70 | HR 56 | Ht 65.5 in | Wt 226.2 lb

## 2019-11-21 DIAGNOSIS — Z79899 Other long term (current) drug therapy: Secondary | ICD-10-CM | POA: Diagnosis not present

## 2019-11-21 DIAGNOSIS — E782 Mixed hyperlipidemia: Secondary | ICD-10-CM

## 2019-11-21 DIAGNOSIS — I10 Essential (primary) hypertension: Secondary | ICD-10-CM

## 2019-11-21 DIAGNOSIS — I5032 Chronic diastolic (congestive) heart failure: Secondary | ICD-10-CM

## 2019-11-21 DIAGNOSIS — E669 Obesity, unspecified: Secondary | ICD-10-CM | POA: Diagnosis not present

## 2019-11-21 DIAGNOSIS — G4733 Obstructive sleep apnea (adult) (pediatric): Secondary | ICD-10-CM

## 2019-11-21 DIAGNOSIS — E119 Type 2 diabetes mellitus without complications: Secondary | ICD-10-CM | POA: Diagnosis not present

## 2019-11-21 DIAGNOSIS — I4819 Other persistent atrial fibrillation: Secondary | ICD-10-CM | POA: Diagnosis not present

## 2019-11-21 LAB — TSH: TSH: 1.75 u[IU]/mL (ref 0.450–4.500)

## 2019-11-21 LAB — HEPATIC FUNCTION PANEL
ALT: 14 IU/L (ref 0–44)
AST: 20 IU/L (ref 0–40)
Albumin: 4.2 g/dL (ref 3.7–4.7)
Alkaline Phosphatase: 71 IU/L (ref 48–121)
Bilirubin Total: 0.7 mg/dL (ref 0.0–1.2)
Bilirubin, Direct: 0.21 mg/dL (ref 0.00–0.40)
Total Protein: 6.5 g/dL (ref 6.0–8.5)

## 2019-11-21 MED ORDER — AMIODARONE HCL 400 MG PO TABS: 400.0000 mg | ORAL_TABLET | Freq: Every day | ORAL | 3 refills | Status: DC

## 2019-11-21 NOTE — Patient Instructions (Addendum)
Medication Instructions:  1) Start Amiodarone 400 mg twice a day for 2 weeks then decrease to 400 mg daily   *If you need a refill on your cardiac medications before your next appointment, please call your pharmacy*   Lab Work: [place]- Today   If you have labs (blood work) drawn today and your tests are completely normal, you will receive your results only by: Marland Kitchen MyChart Message (if you have MyChart) OR . A paper copy in the mail If you have any lab test that is abnormal or we need to change your treatment, we will call you to review the results.   Testing/Procedures: You have been referred to see Allegra Lai, MD for a A-Fib (Evaluation of ablation)  Your physician has recommended that you have a pulmonary function test at [facility]. Pulmonary Function Tests are a group of tests that measure how well air moves in and out of your lungs.   Follow-Up: At [facility], you and your health needs are our priority.  As part of our continuing mission to provide you with exceptional heart care, we have created designated Provider Care Teams.  These Care Teams include your primary Cardiologist (physician) and Advanced Practice Providers (APPs -  Physician Assistants and Nurse Practitioners) who all work together to provide you with the care you need, when you need it.  We recommend signing up for the patient portal called "MyChart".  Sign up information is provided on this After Visit Summary.  MyChart is used to connect with patients for Virtual Visits (Telemedicine).  Patients are able to view lab/test results, encounter notes, upcoming appointments, etc.  Non-urgent messages can be sent to your provider as well.   To learn more about what you can do with MyChart, go to [email or web address].    Your next appointment:   1 month(s)  The format for your next appointment:   In Person  Provider:   Berniece Salines, DO   Other Instructions Ok to scheduled your colonoscopy after  12/09/2019

## 2019-11-21 NOTE — Progress Notes (Signed)
Cardiology Office Note:    Date:  11/21/2019   ID:  Logan [name], DOB 11/08/1945, MRN [id number]  PCP:  [name], MD  Cardiologist:  Berniece Salines, DO  Electrophysiologist:  None   Referring MD: [name], MD   " I am doing well"   History of Present Illness:    Logan [name] is a 74 y.o. male with a hx of of atrial fibrillation on Xarelto and carvedilol, hypertension, hyperlipidemia, prediabetes and morbid obesity.   At his last visit I scheduled the patient for on DC cardioversion at [facility].  He was able to [facility] to get this procedure done.  This procedure was done on October 31, 2019 unfortunately the patient did not correct the sinus rhythm even after being shocked 3 times with 200 J.  He continues to take his Xarelto.  He tells me that he is still short of breath on exertion and at rest.  He denies any chest pain.  No other complaints at this time.  But the patient did inquire what would be the best time to undergo his colonoscopy.  I explained to the patient that since he had his already version July 12 we have to wait 4 weeks and advise him that he should schedule his procedure for after December 09, 2019.  Past Medical History:  Diagnosis Date  . Anxiety   . Atherosclerotic renal artery stenosis, unilateral ([place]) 02/28/2014  . Atrial fibrillation ([place])   . Bilateral lower extremity edema   . Diabetes mellitus without complication ([place])   . DVT (deep venous thrombosis) ([place])   . Esophageal reflux   . Gout   . Hyperlipidemia   . Hypertension   . Morbid obesity ([place])   . Orthopnea   . Osteoarthritis   . Seasonal allergies   . Sleep apnea     Past Surgical History:  Procedure Laterality Date  . ABDOMINAL AORTAGRAM N/A 02/28/2014   Procedure: ABDOMINAL AORTAGRAM;  Surgeon: Serafina Mitchell, MD;  Location: [facility] CATH LAB;  Service: Cardiovascular;  Laterality: N/A;  . APPENDECTOMY    . CARPAL TUNNEL RELEASE Left     Current  Medications: Current Meds  Medication Sig  . allopurinol (ZYLOPRIM) 300 MG tablet Take 300 mg by mouth daily.  Marland Kitchen aspirin EC 81 MG tablet Take 81 mg by mouth daily.  . carvedilol (COREG) 12.5 MG tablet Take 12.5 mg by mouth 2 (two) times daily with a meal.  . Cholecalciferol (VITAMIN D-3) 1000 UNITS CAPS Take 1,000 Units by mouth daily.  Marland Kitchen DHEA 25 MG CAPS Take 25 mg by mouth daily.  Marland Kitchen doxazosin (CARDURA) 8 MG tablet 8 mg daily.  . fluticasone (FLONASE) 50 MCG/ACT nasal spray Place 1 spray into both nostrils daily.  . furosemide (LASIX) 40 MG tablet Take 1 tablet (40 mg total) by mouth daily.  . hydrALAZINE (APRESOLINE) 25 MG tablet Take 25 mg by mouth in the morning and at bedtime.   Marland Kitchen ketoconazole (NIZORAL) 2 % cream Apply 1 application topically daily as needed for irritation.   Marland Kitchen loratadine (CLARITIN) 10 MG tablet Take 10 mg by mouth daily as needed for allergies.  Marland Kitchen losartan (COZAAR) 100 MG tablet Take 100 mg by mouth daily.  . meloxicam (MOBIC) 7.5 MG tablet 7.5 mg 2 (two) times daily as needed.  . nystatin-triamcinolone (MYCOLOG II) cream Apply 1 application topically 2 (two) times daily.  Marland Kitchen omeprazole (PRILOSEC) 20 MG capsule Take 20 mg by mouth daily as needed (for heartburn).   Marland Kitchen  potassium chloride SA (KLOR-CON) 20 MEQ tablet Take 1 tablet (20 mEq total) by mouth daily.  . pravastatin (PRAVACHOL) 20 MG tablet 20 mg daily.  . rivaroxaban (XARELTO) 20 MG TABS tablet Take 1 tablet (20 mg total) by mouth daily with supper.  . traMADol (ULTRAM) 50 MG tablet 50 mg daily as needed.  . vitamin B-12 (CYANOCOBALAMIN) 1000 MCG tablet Take 1,000 mcg by mouth daily.     Allergies:   Patient has no known allergies.   Social History   Socioeconomic History  . Marital status: Widowed    Spouse name: Not on file  . Number of children: Not on file  . Years of education: Not on file  . Highest education level: Not on file  Occupational History  . Not on file  Tobacco Use  . Smoking status:  Former Smoker    Quit date: 02/07/1997    Years since quitting: 22.8  . Smokeless tobacco: Current User    Types: Chew  Substance and Sexual Activity  . Alcohol use: Never  . Drug use: Never  . Sexual activity: Not on file  Other Topics Concern  . Not on file  Social History Narrative  . Not on file   Social Determinants of Health   Financial Resource Strain:   . Difficulty of Paying Living Expenses:   Food Insecurity:   . Worried About Charity fundraiser in the Last Year:   . Arboriculturist in the Last Year:   Transportation Needs:   . Film/video editor (Medical):   Marland Kitchen Lack of Transportation (Non-Medical):   Physical Activity:   . Days of Exercise per Week:   . Minutes of Exercise per Session:   Stress:   . Feeling of Stress :   Social Connections:   . Frequency of Communication with Friends and Family:   . Frequency of Social Gatherings with Friends and Family:   . Attends Religious Services:   . Active Member of Clubs or Organizations:   . Attends Archivist Meetings:   Marland Kitchen Marital Status:      Family History: The patient's family history includes Cancer in his father; Heart disease in his mother.  ROS:   Review of Systems  Constitution: Negative for decreased appetite, fever and weight gain.  HENT: Negative for congestion, ear discharge, hoarse voice and sore throat.   Eyes: Negative for discharge, redness, vision loss in right eye and visual halos.  Cardiovascular: Negative for chest pain, dyspnea on exertion, leg swelling, orthopnea and palpitations.  Respiratory: Negative for cough, hemoptysis, shortness of breath and snoring.   Endocrine: Negative for heat intolerance and polyphagia.  Hematologic/Lymphatic: Negative for bleeding problem. Does not bruise/bleed easily.  Skin: Negative for flushing, nail changes, rash and suspicious lesions.  Musculoskeletal: Negative for arthritis, joint pain, muscle cramps, myalgias, neck pain and stiffness.   Gastrointestinal: Negative for abdominal pain, bowel incontinence, diarrhea and excessive appetite.  Genitourinary: Negative for decreased libido, genital sores and incomplete emptying.  Neurological: Negative for brief paralysis, focal weakness, headaches and loss of balance.  Psychiatric/Behavioral: Negative for altered mental status, depression and suicidal ideas.  Allergic/Immunologic: Negative for HIV exposure and persistent infections.    EKGs/Labs/Other Studies Reviewed:    The following studies were reviewed today:   EKG:  The ekg ordered today demonstrates    Echo IMPRESSIONS  1. Left ventricular ejection fraction, by estimation, is 55 to 60%. The left ventricle has normal function. The left ventricle has  no regional  wall motion abnormalities. There is mild concentric left ventricular hypertrophy. Left ventricular diastolic parameters are indeterminate.  2. Right ventricular systolic function is mildly reduced. The right ventricular size is mildly enlarged. Mildly increased right ventricular  wall thickness. There is normal pulmonary artery systolic pressure. 3. Right atrial size was severely dilated.  4. The mitral valve is normal in structure. No evidence of mitral valve regurgitation. No evidence of mitral stenosis.  5. The aortic valve is tricuspid. Aortic valve regurgitation is mild. No aortic stenosis is present.  6. The inferior vena cava is dilated in size with >50% respiratory variability, suggesting right atrial pressure of 8 mmHg  Recent Labs: 10/21/2019: BUN 19; Creatinine, Ser 1.01; Magnesium 1.9; Potassium 3.8; Sodium 145  Recent Lipid Panel No results found for: CHOL, TRIG, HDL, CHOLHDL, VLDL, LDLCALC, LDLDIRECT  Physical Exam:    VS:  BP (!) 136/70   Pulse 56   Ht 5' 5.5" (1.664 m)   Wt (!) 226 lb 3.2 oz (102.6 kg)   SpO2 96%   BMI 37.07 kg/m     Wt Readings from Last 3 Encounters:  11/21/19 (!) 226 lb 3.2 oz (102.6 kg)  10/21/19 228 lb 9.6 oz  (103.7 kg)  08/24/19 240 lb (108.9 kg)     GEN: Well nourished, well developed in no acute distress HEENT: Normal NECK: No JVD; No carotid bruits LYMPHATICS: No lymphadenopathy CARDIAC: S1S2 noted,RRR, no murmurs, rubs, gallops RESPIRATORY:  Clear to auscultation without rales, wheezing or rhonchi  ABDOMEN: Soft, non-tender, non-distended, +bowel sounds, no guarding. EXTREMITIES: No edema, No cyanosis, no clubbing MUSCULOSKELETAL:  No deformity  SKIN: Warm and dry NEUROLOGIC:  Alert and oriented x 3, non-focal PSYCHIATRIC:  Normal affect, good insight  ASSESSMENT:    1. Essential hypertension   2. Persistent atrial fibrillation ([place])   3. Chronic diastolic heart failure ([place])   4. Obstructive sleep apnea syndrome   5. Obesity (BMI 30-39.9)   6. Mixed hyperlipidemia   7. Diabetes mellitus without complication ([place])   8. Medication management    PLAN:     The patient still in atrial fibrillation.  And is symptomatic.  I am going to start the patient on amiodarone will do 400 mg twice a day for 2 weeks and then convert the patient to 400 mg daily.  He will continue his Xarelto without any interruption.  He understands that he needs to stay on this for a minimum of 4 weeks after the date of his cardioversion.  We discussed and he will schedule his colonoscopy for after December 09, 2019 at which time he can be able to hold his Xarelto for 3 separate doses.  I also will set the patient up to see our EP colleague given his symptoms may be a great candidate to be considered for A. fib ablation.  Hypertension his blood pressure deceptively in the office therefore we will continue him on his current antihypertensive regimen which includes carvedilol 12.5 mg daily, he is on Lasix 80 mg daily, 100 mg of losartan.  Hyperlipidemia continue patient on 20 mg of Pravachol.  Obesity-the patient understands the need to lose weight with diet and exercise. We have discussed specific strategies for  this.  Blood work will be done today for TSH and LFTs.  I will also send the patient for pulmonary function tests in the start of his amiodarone.  The patient is in agreement with the above plan. The patient left the office in stable condition.  The patient will follow up in 1 month or sooner if needed.   Medication Adjustments/Labs and Tests Ordered: Current medicines are reviewed at length with the patient today.  Concerns regarding medicines are outlined above.  Orders Placed This Encounter  Procedures  . TSH  . Hepatic function panel  . Ambulatory referral to Cardiac Electrophysiology   Meds ordered this encounter  Medications  . amiodarone (PACERONE) 400 MG tablet    Sig: Take 1 tablet (400 mg total) by mouth daily. Take 1 tablet by mouth twice a day for 2 weeks, then decrease to 1 tablet daily    Dispense:  180 tablet    Refill:  3    Patient Instructions  Medication Instructions:  1) Start Amiodarone 400 mg twice a day for 2 weeks then decrease to 400 mg daily   *If you need a refill on your cardiac medications before your next appointment, please call your pharmacy*   Lab Work: [place]- Today   If you have labs (blood work) drawn today and your tests are completely normal, you will receive your results only by: Marland Kitchen MyChart Message (if you have MyChart) OR . A paper copy in the mail If you have any lab test that is abnormal or we need to change your treatment, we will call you to review the results.   Testing/Procedures: You have been referred to see Allegra Lai, MD for a A-Fib (Evaluation of ablation)  Your physician has recommended that you have a pulmonary function test at [facility]. Pulmonary Function Tests are a group of tests that measure how well air moves in and out of your lungs.   Follow-Up: At [facility], you and your health needs are our priority.  As part of our continuing mission to provide you with exceptional heart care, we have created  designated Provider Care Teams.  These Care Teams include your primary Cardiologist (physician) and Advanced Practice Providers (APPs -  Physician Assistants and Nurse Practitioners) who all work together to provide you with the care you need, when you need it.  We recommend signing up for the patient portal called "MyChart".  Sign up information is provided on this After Visit Summary.  MyChart is used to connect with patients for Virtual Visits (Telemedicine).  Patients are able to view lab/test results, encounter notes, upcoming appointments, etc.  Non-urgent messages can be sent to your provider as well.   To learn more about what you can do with MyChart, go to [email or web address].    Your next appointment:   1 month(s)  The format for your next appointment:   In Person  Provider:   Berniece Salines, DO   Other Instructions Ok to scheduled your colonoscopy after 12/09/2019     Adopting a Healthy Lifestyle.  Know what a healthy weight is for you (roughly BMI <25) and aim to maintain this   Aim for 7+ servings of fruits and vegetables daily   65-80+ fluid ounces of water or unsweet tea for healthy kidneys   Limit to max 1 drink of alcohol per day; avoid smoking/tobacco   Limit animal fats in diet for cholesterol and heart health - choose grass fed whenever available   Avoid highly processed foods, and foods high in saturated/trans fats   Aim for low stress - take time to unwind and care for your mental health   Aim for 150 min of moderate intensity exercise weekly for heart health, and weights twice weekly for bone health  Aim for 7-9 hours of sleep daily   When it comes to diets, agreement about the perfect plan isnt easy to find, even among the experts. Experts at the [employer] developed an idea known as the Healthy Eating Plate. Just imagine a plate divided into logical, healthy portions.   The emphasis is on diet quality:   Load up on  vegetables and fruits - one-half of your plate: Aim for color and variety, and remember that potatoes dont count.   Go for whole grains - one-quarter of your plate: Whole wheat, barley, wheat berries, quinoa, oats, brown rice, and foods made with them. If you want pasta, go with whole wheat pasta.   Protein power - one-quarter of your plate: Fish, chicken, beans, and nuts are all healthy, versatile protein sources. Limit red meat.   The diet, however, does go beyond the plate, offering a few other suggestions.   Use healthy plant oils, such as olive, canola, soy, corn, sunflower and peanut. Check the labels, and avoid partially hydrogenated oil, which have unhealthy trans fats.   If youre thirsty, drink water. Coffee and tea are good in moderation, but skip sugary drinks and limit milk and dairy products to one or two daily servings.   The type of carbohydrate in the diet is more important than the amount. Some sources of carbohydrates, such as vegetables, fruits, whole grains, and beans-are healthier than others.   Finally, stay active  Signed, Berniece Salines, DO  11/21/2019 9:43 AM    [place]

## 2019-11-22 ENCOUNTER — Telehealth: Payer: Self-pay

## 2019-11-22 NOTE — Telephone Encounter (Signed)
-----   Message from Berniece Salines, DO sent at 11/22/2019  9:06 AM EDT ----- Labs normal.

## 2019-11-22 NOTE — Telephone Encounter (Signed)
Spoke with patient regarding results and recommendation.  Patient verbalizes understanding and is agreeable to plan of care. Advised patient to call back with any issues or concerns.  

## 2019-11-24 DIAGNOSIS — D6869 Other thrombophilia: Secondary | ICD-10-CM | POA: Diagnosis not present

## 2019-11-24 DIAGNOSIS — Z79899 Other long term (current) drug therapy: Secondary | ICD-10-CM | POA: Diagnosis not present

## 2019-11-24 DIAGNOSIS — E785 Hyperlipidemia, unspecified: Secondary | ICD-10-CM | POA: Diagnosis not present

## 2019-11-24 DIAGNOSIS — I10 Essential (primary) hypertension: Secondary | ICD-10-CM | POA: Diagnosis not present

## 2019-11-24 DIAGNOSIS — M109 Gout, unspecified: Secondary | ICD-10-CM | POA: Diagnosis not present

## 2019-11-24 DIAGNOSIS — I4891 Unspecified atrial fibrillation: Secondary | ICD-10-CM | POA: Diagnosis not present

## 2019-11-24 DIAGNOSIS — E1169 Type 2 diabetes mellitus with other specified complication: Secondary | ICD-10-CM | POA: Diagnosis not present

## 2019-12-22 ENCOUNTER — Other Ambulatory Visit: Payer: Self-pay

## 2019-12-22 ENCOUNTER — Ambulatory Visit: Payer: Medicare HMO | Admitting: Cardiology

## 2019-12-22 ENCOUNTER — Encounter: Payer: Self-pay | Admitting: Cardiology

## 2019-12-22 VITALS — BP 142/68 | HR 58 | Ht 67.0 in | Wt 227.0 lb

## 2019-12-22 DIAGNOSIS — I10 Essential (primary) hypertension: Secondary | ICD-10-CM

## 2019-12-22 DIAGNOSIS — G4733 Obstructive sleep apnea (adult) (pediatric): Secondary | ICD-10-CM

## 2019-12-22 DIAGNOSIS — E669 Obesity, unspecified: Secondary | ICD-10-CM | POA: Diagnosis not present

## 2019-12-22 DIAGNOSIS — Z79899 Other long term (current) drug therapy: Secondary | ICD-10-CM | POA: Diagnosis not present

## 2019-12-22 DIAGNOSIS — I5032 Chronic diastolic (congestive) heart failure: Secondary | ICD-10-CM

## 2019-12-22 DIAGNOSIS — E782 Mixed hyperlipidemia: Secondary | ICD-10-CM | POA: Diagnosis not present

## 2019-12-22 DIAGNOSIS — I4819 Other persistent atrial fibrillation: Secondary | ICD-10-CM | POA: Diagnosis not present

## 2019-12-22 MED ORDER — FUROSEMIDE 40 MG PO TABS
60.0000 mg | ORAL_TABLET | Freq: Every day | ORAL | 3 refills | Status: DC
Start: 2019-12-22 — End: 2020-02-17

## 2019-12-22 NOTE — Progress Notes (Signed)
Cardiology Office Note:    Date:  12/22/2019   ID:  Logan [name], DOB 1945-10-03, MRN [id number]  PCP:  [name], MD  Cardiologist:  Berniece Salines, DO  Electrophysiologist:  None   Referring MD: [name], MD   " I am here for follow up visit"   History of Present Illness:    Logan [name] is a 74 y.o. male with a hx of of atrial fibrillation on Xarelto and carvedilol, hypertension, hyperlipidemia, prediabetes and morbid obesity.   The patient is here today for follow-up visit.  Last saw the patient November 21, 2019 at that time he was status post cardioversion however he was shocked 3 times with 200 J but unfortunately patient did not convert into sinus rhythm.  At his last visit I started the patient amiodarone hoping to get him converted to sinus rhythm.  During that time he was planning colonoscopy I did asked the patient to please wait until after August 20 which should been a good 4 weeks after his procedure.  He is here today for follow-up.  He has been doing well.  He is going to see his GI doctor today to schedule his anoscopy.  Past Medical History:  Diagnosis Date  . Anxiety   . Atherosclerotic renal artery stenosis, unilateral ([place]) 02/28/2014  . Atrial fibrillation ([place])   . Bilateral lower extremity edema   . Diabetes mellitus without complication ([place])   . DVT (deep venous thrombosis) ([place])   . Esophageal reflux   . Gout   . Hyperlipidemia   . Hypertension   . Morbid obesity ([place])   . Orthopnea   . Osteoarthritis   . Seasonal allergies   . Sleep apnea     Past Surgical History:  Procedure Laterality Date  . ABDOMINAL AORTAGRAM N/A 02/28/2014   Procedure: ABDOMINAL AORTAGRAM;  Surgeon: Serafina Mitchell, MD;  Location: [facility] CATH LAB;  Service: Cardiovascular;  Laterality: N/A;  . APPENDECTOMY    . CARPAL TUNNEL RELEASE Left     Current Medications: Current Meds  Medication Sig  . allopurinol (ZYLOPRIM) 300 MG tablet Take 300 mg by mouth daily.   Marland Kitchen amiodarone (PACERONE) 400 MG tablet Take 1 tablet (400 mg total) by mouth daily. Take 1 tablet by mouth twice a day for 2 weeks, then decrease to 1 tablet daily  . aspirin EC 81 MG tablet Take 81 mg by mouth daily. Friday, Monday, Wednesday is the only days patient takes the aspirin  . carvedilol (COREG) 12.5 MG tablet Take 12.5 mg by mouth 2 (two) times daily with a meal.  . Cholecalciferol (VITAMIN D-3) 1000 UNITS CAPS Take 1,000 Units by mouth daily.  Marland Kitchen DHEA 25 MG CAPS Take 25 mg by mouth daily.  Marland Kitchen doxazosin (CARDURA) 8 MG tablet 8 mg daily.  . fluticasone (FLONASE) 50 MCG/ACT nasal spray Place 1 spray into both nostrils daily.  . furosemide (LASIX) 40 MG tablet Take 1.5 tablets (60 mg total) by mouth daily. Take 40 mg in the morning and 20 mg in the evening  . hydrALAZINE (APRESOLINE) 25 MG tablet Take 25 mg by mouth in the morning and at bedtime.   Marland Kitchen ketoconazole (NIZORAL) 2 % cream Apply 1 application topically daily as needed for irritation.   Marland Kitchen loratadine (CLARITIN) 10 MG tablet Take 10 mg by mouth daily as needed for allergies.  Marland Kitchen losartan (COZAAR) 100 MG tablet Take 100 mg by mouth daily.  . meloxicam (MOBIC) 7.5 MG tablet 7.5 mg 2 (two)  times daily as needed.  . nystatin-triamcinolone (MYCOLOG II) cream Apply 1 application topically 2 (two) times daily.  Marland Kitchen omeprazole (PRILOSEC) 20 MG capsule Take 20 mg by mouth daily as needed (for heartburn).   . potassium chloride SA (KLOR-CON) 20 MEQ tablet Take 1 tablet (20 mEq total) by mouth daily.  . pravastatin (PRAVACHOL) 20 MG tablet 20 mg daily.  . rivaroxaban (XARELTO) 20 MG TABS tablet Take 1 tablet (20 mg total) by mouth daily with supper.  . traMADol (ULTRAM) 50 MG tablet 50 mg daily as needed.  . vitamin B-12 (CYANOCOBALAMIN) 1000 MCG tablet Take 1,000 mcg by mouth daily.  . [DISCONTINUED] furosemide (LASIX) 40 MG tablet Take 1 tablet (40 mg total) by mouth daily.     Allergies:   Patient has no known allergies.   Social  History   Socioeconomic History  . Marital status: Widowed    Spouse name: Not on file  . Number of children: Not on file  . Years of education: Not on file  . Highest education level: Not on file  Occupational History  . Not on file  Tobacco Use  . Smoking status: Former Smoker    Quit date: 02/07/1997    Years since quitting: 22.8  . Smokeless tobacco: Current User    Types: Chew  Substance and Sexual Activity  . Alcohol use: Never  . Drug use: Never  . Sexual activity: Not on file  Other Topics Concern  . Not on file  Social History Narrative  . Not on file   Social Determinants of Health   Financial Resource Strain:   . Difficulty of Paying Living Expenses: Not on file  Food Insecurity:   . Worried About Charity fundraiser in the Last Year: Not on file  . Ran Out of Food in the Last Year: Not on file  Transportation Needs:   . Lack of Transportation (Medical): Not on file  . Lack of Transportation (Non-Medical): Not on file  Physical Activity:   . Days of Exercise per Week: Not on file  . Minutes of Exercise per Session: Not on file  Stress:   . Feeling of Stress : Not on file  Social Connections:   . Frequency of Communication with Friends and Family: Not on file  . Frequency of Social Gatherings with Friends and Family: Not on file  . Attends Religious Services: Not on file  . Active Member of Clubs or Organizations: Not on file  . Attends Archivist Meetings: Not on file  . Marital Status: Not on file     Family History: The patient's family history includes Cancer in his father; Heart disease in his mother.  ROS:   Review of Systems  Constitution: Negative for decreased appetite, fever and weight gain.  HENT: Negative for congestion, ear discharge, hoarse voice and sore throat.   Eyes: Negative for discharge, redness, vision loss in right eye and visual halos.  Cardiovascular: Negative for chest pain, dyspnea on exertion, leg swelling,  orthopnea and palpitations.  Respiratory: Negative for cough, hemoptysis, shortness of breath and snoring.   Endocrine: Negative for heat intolerance and polyphagia.  Hematologic/Lymphatic: Negative for bleeding problem. Does not bruise/bleed easily.  Skin: Negative for flushing, nail changes, rash and suspicious lesions.  Musculoskeletal: Negative for arthritis, joint pain, muscle cramps, myalgias, neck pain and stiffness.  Gastrointestinal: Negative for abdominal pain, bowel incontinence, diarrhea and excessive appetite.  Genitourinary: Negative for decreased libido, genital sores and incomplete emptying.  Neurological: Negative for brief paralysis, focal weakness, headaches and loss of balance.  Psychiatric/Behavioral: Negative for altered mental status, depression and suicidal ideas.  Allergic/Immunologic: Negative for HIV exposure and persistent infections.    EKGs/Labs/Other Studies Reviewed:    The following studies were reviewed today:   EKG:  The ekg ordered today demonstrates atrial fibrillation, heart rate 58 bpm with low voltage and poor precordial leads progression.  Echocardiogram impression 1. Left ventricular ejection fraction, by estimation, is 55 to 60%. The  left ventricle has normal function. The left ventricle has no regional  wall motion abnormalities. There is mild concentric left ventricular  hypertrophy. Left ventricular diastolic  parameters are indeterminate.  2. Right ventricular systolic function is mildly reduced. The right  ventricular size is mildly enlarged. Mildly increased right ventricular  wall thickness. There is normal pulmonary artery systolic pressure.  3. Right atrial size was severely dilated.  4. The mitral valve is normal in structure. No evidence of mitral valve  regurgitation. No evidence of mitral stenosis.  5. The aortic valve is tricuspid. Aortic valve regurgitation is mild. No  aortic stenosis is present.  6. The inferior vena  cava is dilated in size with >50% respiratory  variability, suggesting right atrial pressure of 8 mmHg.   Recent Labs: 10/21/2019: BUN 19; Creatinine, Ser 1.01; Magnesium 1.9; Potassium 3.8; Sodium 145 11/21/2019: ALT 14; TSH 1.750  Recent Lipid Panel No results found for: CHOL, TRIG, HDL, CHOLHDL, VLDL, LDLCALC, LDLDIRECT  Physical Exam:    VS:  BP (!) 142/68   Pulse (!) 58   Ht 5\' 7"  (1.702 m)   Wt 227 lb (103 kg)   SpO2 95%   BMI 35.55 kg/m     Wt Readings from Last 3 Encounters:  12/22/19 227 lb (103 kg)  11/21/19 (!) 226 lb 3.2 oz (102.6 kg)  10/21/19 228 lb 9.6 oz (103.7 kg)     GEN: Well nourished, well developed in no acute distress HEENT: Normal NECK: No JVD; No carotid bruits LYMPHATICS: No lymphadenopathy CARDIAC: S1S2 noted,RRR, no murmurs, rubs, gallops RESPIRATORY:  Clear to auscultation without rales, wheezing or rhonchi  ABDOMEN: Soft, non-tender, non-distended, +bowel sounds, no guarding. EXTREMITIES: No edema, No cyanosis, no clubbing MUSCULOSKELETAL:  No deformity  SKIN: Warm and dry NEUROLOGIC:  Alert and oriented x 3, non-focal PSYCHIATRIC:  Normal affect, good insight  ASSESSMENT:    1. Medication management   2. Essential hypertension   3. Persistent atrial fibrillation ([place])   4. Chronic diastolic heart failure ([place])   5. Obstructive sleep apnea syndrome   6. Mixed hyperlipidemia   7. Obesity (BMI 30-39.9)    PLAN:     He still in A. fib despite amiodarone.  At this time I am not going to pursue cardioversion as the patient is planning to get his colonoscopy done.  His visit with EP is still pending.  His left atrial size is normal so I am hoping that since he has failed cardioversion in antiarrhythmic therapy he may be a great candidate for atrial fibrillation ablation.  He is significantly symptomatic with shortness of breath and fatigue.  Bilateral leg edema has not improved he is on Lasix 40 mg daily.  I am going to change that to 40 mg in  the morning and 20 mg in the evening.  He will still remain on potassium supplements.  For his colonoscopy asked talk to the patient to hold his Xarelto for 3 separate doses prior to his procedure and  restart the Xarelto the next day at the discretion of the physician performing the procedure.  His blood pressure is also slightly elevated hoping the change in diuretics will also help him bring him closer to his blood pressure target of less than 130/80.  The patient understands the need to lose weight with diet and exercise. We have discussed specific strategies for this.  The patient is in agreement with the above plan. The patient left the office in stable condition.  The patient will follow up in 8 weeks or sooner if needed.   Medication Adjustments/Labs and Tests Ordered: Current medicines are reviewed at length with the patient today.  Concerns regarding medicines are outlined above.  Orders Placed This Encounter  Procedures  . Basic metabolic panel  . Magnesium  . CBC   Meds ordered this encounter  Medications  . furosemide (LASIX) 40 MG tablet    Sig: Take 1.5 tablets (60 mg total) by mouth daily. Take 40 mg in the morning and 20 mg in the evening    Dispense:  90 tablet    Refill:  3    Patient Instructions  Medication Instructions:  Increase Lasix to 40 mg in the morning and 20 mg in the evening   *If you need a refill on your cardiac medications before your next appointment, please call your pharmacy*   Lab Work: Bmp, Mag, Cbc  If you have labs (blood work) drawn today and your tests are completely normal, you will receive your results only by: Marland Kitchen MyChart Message (if you have MyChart) OR . A paper copy in the mail If you have any lab test that is abnormal or we need to change your treatment, we will call you to review the results.   Testing/Procedures: None ordered    Follow-Up: At [facility], you and your health needs are our priority.  As part of our  continuing mission to provide you with exceptional heart care, we have created designated Provider Care Teams.  These Care Teams include your primary Cardiologist (physician) and Advanced Practice Providers (APPs -  Physician Assistants and Nurse Practitioners) who all work together to provide you with the care you need, when you need it.  We recommend signing up for the patient portal called "MyChart".  Sign up information is provided on this After Visit Summary.  MyChart is used to connect with patients for Virtual Visits (Telemedicine).  Patients are able to view lab/test results, encounter notes, upcoming appointments, etc.  Non-urgent messages can be sent to your provider as well.   To learn more about what you can do with MyChart, go to [email or web address].    Your next appointment:   8 week(s)  The format for your next appointment:   In Person  Provider:   Jyl Heinz, MD   Other Instructions:  Ok to hold Xarelto 3 days prior to colonoscopy       Adopting a Healthy Lifestyle.  Know what a healthy weight is for you (roughly BMI <25) and aim to maintain this   Aim for 7+ servings of fruits and vegetables daily   65-80+ fluid ounces of water or unsweet tea for healthy kidneys   Limit to max 1 drink of alcohol per day; avoid smoking/tobacco   Limit animal fats in diet for cholesterol and heart health - choose grass fed whenever available   Avoid highly processed foods, and foods high in saturated/trans fats   Aim for low stress - take time to unwind and  care for your mental health   Aim for 150 min of moderate intensity exercise weekly for heart health, and weights twice weekly for bone health   Aim for 7-9 hours of sleep daily   When it comes to diets, agreement about the perfect plan isnt easy to find, even among the experts. Experts at the [employer] developed an idea known as the Healthy Eating Plate. Just imagine a plate divided into  logical, healthy portions.   The emphasis is on diet quality:   Load up on vegetables and fruits - one-half of your plate: Aim for color and variety, and remember that potatoes dont count.   Go for whole grains - one-quarter of your plate: Whole wheat, barley, wheat berries, quinoa, oats, brown rice, and foods made with them. If you want pasta, go with whole wheat pasta.   Protein power - one-quarter of your plate: Fish, chicken, beans, and nuts are all healthy, versatile protein sources. Limit red meat.   The diet, however, does go beyond the plate, offering a few other suggestions.   Use healthy plant oils, such as olive, canola, soy, corn, sunflower and peanut. Check the labels, and avoid partially hydrogenated oil, which have unhealthy trans fats.   If youre thirsty, drink water. Coffee and tea are good in moderation, but skip sugary drinks and limit milk and dairy products to one or two daily servings.   The type of carbohydrate in the diet is more important than the amount. Some sources of carbohydrates, such as vegetables, fruits, whole grains, and beans-are healthier than others.   Finally, stay active  Signed, Berniece Salines, DO  12/22/2019 11:57 AM    [place]

## 2019-12-22 NOTE — Addendum Note (Signed)
Addended by: Xzavian Semmel, Jonelle Sidle L on: 12/22/2019 02:01 PM   Modules accepted: Orders

## 2019-12-22 NOTE — Patient Instructions (Addendum)
Medication Instructions:  Increase Lasix to 40 mg in the morning and 20 mg in the evening   *If you need a refill on your cardiac medications before your next appointment, please call your pharmacy*   Lab Work: Bmp, Mag, Cbc  If you have labs (blood work) drawn today and your tests are completely normal, you will receive your results only by: Marland Kitchen MyChart Message (if you have MyChart) OR . A paper copy in the mail If you have any lab test that is abnormal or we need to change your treatment, we will call you to review the results.   Testing/Procedures: None ordered    Follow-Up: At [facility], you and your health needs are our priority.  As part of our continuing mission to provide you with exceptional heart care, we have created designated Provider Care Teams.  These Care Teams include your primary Cardiologist (physician) and Advanced Practice Providers (APPs -  Physician Assistants and Nurse Practitioners) who all work together to provide you with the care you need, when you need it.  We recommend signing up for the patient portal called "MyChart".  Sign up information is provided on this After Visit Summary.  MyChart is used to connect with patients for Virtual Visits (Telemedicine).  Patients are able to view lab/test results, encounter notes, upcoming appointments, etc.  Non-urgent messages can be sent to your provider as well.   To learn more about what you can do with MyChart, go to [email or web address].    Your next appointment:   8 week(s)  The format for your next appointment:   In Person  Provider:   Jyl Heinz, MD   Other Instructions:  Ok to hold Xarelto 3 days prior to colonoscopy

## 2019-12-23 LAB — BASIC METABOLIC PANEL
BUN/Creatinine Ratio: 14 (ref 10–24)
BUN: 20 mg/dL (ref 8–27)
CO2: 30 mmol/L — ABNORMAL HIGH (ref 20–29)
Calcium: 9.8 mg/dL (ref 8.6–10.2)
Chloride: 104 mmol/L (ref 96–106)
Creatinine, Ser: 1.38 mg/dL — ABNORMAL HIGH (ref 0.76–1.27)
GFR calc Af Amer: 59 mL/min/{1.73_m2} — ABNORMAL LOW (ref >59)
GFR calc non Af Amer: 51 mL/min/{1.73_m2} — ABNORMAL LOW (ref >59)
Glucose: 87 mg/dL (ref 65–99)
Potassium: 4.9 mmol/L (ref 3.5–5.2)
Sodium: 146 mmol/L — ABNORMAL HIGH (ref 134–144)

## 2019-12-23 LAB — CBC
Hematocrit: 39.2 % (ref 37.5–51.0)
Hemoglobin: 13.4 g/dL (ref 13.0–17.7)
MCH: 33.8 pg — ABNORMAL HIGH (ref 26.6–33.0)
MCHC: 34.2 g/dL (ref 31.5–35.7)
MCV: 99 fL — ABNORMAL HIGH (ref 79–97)
Platelets: 243 10*3/uL (ref 150–450)
RBC: 3.96 x10E6/uL — ABNORMAL LOW (ref 4.14–5.80)
RDW: 13.8 % (ref 11.6–15.4)
WBC: 9.1 10*3/uL (ref 3.4–10.8)

## 2019-12-23 LAB — MAGNESIUM: Magnesium: 2 mg/dL (ref 1.6–2.3)

## 2019-12-27 ENCOUNTER — Telehealth: Payer: Self-pay | Admitting: Emergency Medicine

## 2019-12-27 DIAGNOSIS — Z79899 Other long term (current) drug therapy: Secondary | ICD-10-CM

## 2019-12-27 NOTE — Telephone Encounter (Signed)
Called patient informed him of results and recommendations

## 2019-12-27 NOTE — Telephone Encounter (Signed)
-----   Message from Berniece Salines, DO sent at 12/27/2019  9:03 AM EDT ----- Creatinine slightly elevated.  Continue on your current diuretic dosing.  I like to repeat this blood work in 2 weeks.

## 2020-01-10 ENCOUNTER — Other Ambulatory Visit: Payer: Self-pay | Admitting: Cardiology

## 2020-01-10 DIAGNOSIS — R7989 Other specified abnormal findings of blood chemistry: Secondary | ICD-10-CM

## 2020-01-10 DIAGNOSIS — Z79899 Other long term (current) drug therapy: Secondary | ICD-10-CM | POA: Diagnosis not present

## 2020-01-10 LAB — BASIC METABOLIC PANEL
BUN/Creatinine Ratio: 12 (ref 10–24)
BUN: 18 mg/dL (ref 8–27)
CO2: 28 mmol/L (ref 20–29)
Calcium: 9.5 mg/dL (ref 8.6–10.2)
Chloride: 101 mmol/L (ref 96–106)
Creatinine, Ser: 1.54 mg/dL — ABNORMAL HIGH (ref 0.76–1.27)
GFR calc Af Amer: 51 mL/min/{1.73_m2} — ABNORMAL LOW (ref >59)
GFR calc non Af Amer: 44 mL/min/{1.73_m2} — ABNORMAL LOW (ref >59)
Glucose: 100 mg/dL — ABNORMAL HIGH (ref 65–99)
Potassium: 4.9 mmol/L (ref 3.5–5.2)
Sodium: 141 mmol/L (ref 134–144)

## 2020-01-11 NOTE — Addendum Note (Signed)
Addended by: Truddie Hidden on: 01/11/2020 08:41 AM   Modules accepted: Orders

## 2020-01-16 ENCOUNTER — Ambulatory Visit: Payer: Medicare HMO | Admitting: Cardiology

## 2020-01-16 ENCOUNTER — Encounter: Payer: Self-pay | Admitting: Cardiology

## 2020-01-16 ENCOUNTER — Other Ambulatory Visit: Payer: Self-pay | Admitting: *Deleted

## 2020-01-16 ENCOUNTER — Other Ambulatory Visit: Payer: Self-pay

## 2020-01-16 VITALS — BP 108/60 | HR 55 | Ht 67.0 in | Wt 225.0 lb

## 2020-01-16 DIAGNOSIS — I4819 Other persistent atrial fibrillation: Secondary | ICD-10-CM | POA: Diagnosis not present

## 2020-01-16 DIAGNOSIS — G4733 Obstructive sleep apnea (adult) (pediatric): Secondary | ICD-10-CM

## 2020-01-16 DIAGNOSIS — Z01812 Encounter for preprocedural laboratory examination: Secondary | ICD-10-CM

## 2020-01-16 NOTE — Patient Instructions (Signed)
Medication Instructions:  Your physician recommends that you continue on your current medications as directed. Please refer to the Current Medication list given to you today.  *If you need a refill on your cardiac medications before your next appointment, please call your pharmacy*   Lab Work: Pre procedure labs between:  10/12 - 10/29 (stop by the [place] office for this) If you have labs (blood work) drawn today and your tests are completely normal, you will receive your results only by: Marland Kitchen MyChart Message (if you have MyChart) OR . A paper copy in the mail If you have any lab test that is abnormal or we need to change your treatment, we will call you to review the results.   Testing/Procedures: Your physician has requested that you have cardiac CT within 7 days PRIOR to your ablation. Cardiac computed tomography (CT) is a painless test that uses an x-ray machine to take clear, detailed pictures of your heart.  Please follow instructions below located under "other instructions".  Your physician has recommended that you have an ablation. Catheter ablation is a medical procedure used to treat some cardiac arrhythmias (irregular heartbeats). During catheter ablation, a long, thin, flexible tube is put into a blood vessel in your groin (upper thigh), or neck. This tube is called an ablation catheter. It is then guided to your heart through the blood vessel. Radio frequency waves destroy small areas of heart tissue where abnormal heartbeats may cause an arrhythmia to start.  Please follow instructions below located under "other instructions".   Follow-Up: At [facility], you and your health needs are our priority.  As part of our continuing mission to provide you with exceptional heart care, we have created designated Provider Care Teams.  These Care Teams include your primary Cardiologist (physician) and Advanced Practice Providers (APPs -  Physician Assistants and Nurse Practitioners) who all  work together to provide you with the care you need, when you need it.  We recommend signing up for the patient portal called "MyChart".  Sign up information is provided on this After Visit Summary.  MyChart is used to connect with patients for Virtual Visits (Telemedicine).  Patients are able to view lab/test results, encounter notes, upcoming appointments, etc.  Non-urgent messages can be sent to your provider as well.   To learn more about what you can do with MyChart, go to [email or web address].    Your next appointment:   1 month(s) after your ablation  The format for your next appointment:   In Person  Provider:   You will follow up in the [facility] located at [facility]. Your provider will be: Roderic Palau, NP or Clint R. Fenton, PA-C    You have been referred to one of our sleep doctors to further discuss your sleep apnea needs. The office will call to arrange this.    Thank you for choosing [facility]!!   Trinidad Curet, RN [phone]   Other Instructions CT INSTRUCTIONS Your cardiac CT will be scheduled at one of the below locations:   [facility] [address] [place], [place] [address] [phone]  [place] [address] [place], [place] [address] [phone]  If scheduled at [facility], please arrive at the [facility] main entrance of [facility] 30 minutes prior to test start time. Proceed to the [facility] Radiology Department (first floor) to check-in and test prep.  If scheduled at [facility], please arrive 15 mins early for check-in and test prep.  Please follow these instructions carefully (unless otherwise directed):  Hold all erectile dysfunction medications at least 3 days (72 hrs) prior to test.  On the Night Before the Test: . Be sure to Drink plenty of water. . Do not consume any  caffeinated/decaffeinated beverages or chocolate 12 hours prior to your test. . Do not take any antihistamines 12 hours prior to your test.  On the Day of the Test: . Drink plenty of water. Do not drink any water within one hour of the test. . Do not eat any food 4 hours prior to the test. . You may take your regular medications prior to the test.  . HOLD Furosemide/Hydrochlorothiazide morning of the test.      After the Test: . Drink plenty of water. . After receiving IV contrast, you may experience a mild flushed feeling. This is normal. . On occasion, you may experience a mild rash up to 24 hours after the test. This is not dangerous. If this occurs, you can take Benadryl 25 mg and increase your fluid intake. . If you experience trouble breathing, this can be serious. If it is severe call 911 IMMEDIATELY. If it is mild, please call our office. . If you take any of these medications: Glipizide/Metformin, Avandament, Glucavance, please do not take 48 hours after completing test unless otherwise instructed.   Once we have confirmed authorization from your insurance company, we will call you to set up a date and time for your test. Based on how quickly your insurance processes prior authorizations requests, please allow up to 4 weeks to be contacted for scheduling your Cardiac CT appointment. Be advised that routine Cardiac CT appointments could be scheduled as many as 8 weeks after your provider has ordered it.  For non-scheduling related questions, please contact the cardiac imaging nurse navigator should you have any questions/concerns: Marchia Bond, Cardiac Imaging Nurse Navigator Burley Saver, Interim Cardiac Imaging Nurse [place] [facility] V[facility] Services Direct Office Dial: [phone]   For scheduling needs, including cancellations and rescheduling, please call Vivien Rota at [phone], option 3.       Electrophysiology/Ablation Procedure Instructions   You are  scheduled for a(n)  ablation on 02/29/2020 with Dr. Allegra Lai.   1.   Pre procedure testing-             A.  LAB WORK --- between 10/12 - 10/29  for your pre procedure blood work.   Stop by the [facility] office for this, avoid lunchtime hours.  You do NOT need to be fasting.               B. COVID TEST-- On 02/27/2020 @ 10:30 am  - This is a Drive Up Visit at [address] [place], [place] [address].  Someone will direct you to the appropriate testing line. Stay in your car and someone will be with you shortly.   After you are tested please go home and self quarantine until the day of your procedure.     2. On the day of your procedure 02/29/2020 you will go to [facility] [phone]) at 6:30 am.  Dennis Bast will go to the main entrance A [employer]) and enter where the [employer] are.  Your driver will drop you off and you will head down the hallway to ADMITTING.  You may have one support person come in to  the hospital with you.  They will be asked to wait in the waiting room.   3.   Do not eat or drink after midnight prior to your procedure.   4.   Do NOT take any medications the morning of your procedure.       Do not miss any doses of your blood thinner prior to the morning of your procedure or your procedure will need to be rescheduled.   5.  Plan for an overnight stay but you may be discharged after your procedure.  If you are discharged after your procedure you will need someone to drive you home and be with your for 24 hours after your procedure.   6. You will follow up with the AFIB clinic 4 weeks after your procedure.  You will follow up with Dr. Curt Bears  3 months after your procedure.  These appointments will be made for you.   * If you have ANY questions please call the office [phone] and ask for Jaylynne Birkhead RN or send me a MyChart message   * Occasionally, EP Studies and ablations can become lengthy.  Please make your family aware of this before your  procedure starts.  Average time ranges from 2-8 hours for EP studies/ablations.  Your physician will call your family after the procedure with the results.                                     Cardiac Ablation  Cardiac ablation is a procedure to stop some heart tissue from causing problems. The heart has many electrical connections. Sometimes these connections make the heart beat very fast or irregularly. Removing some problem areas can improve the heart rhythm or make it normal. What happens before the procedure?  Follow instructions from your doctor about what you cannot eat or drink.  Ask your doctor about: ? Changing or stopping your normal medicines. This is important if you take diabetes medicines or blood thinners. ? Taking medicines such as aspirin and ibuprofen. These medicines can thin your blood. Do not take these medicines before your procedure if your doctor tells you not to.  Plan to have someone take you home.  If you will be going home right after the procedure, plan to have someone with you for 24 hours. What happens during the procedure?  To lower your risk of infection: ? Your health care team will wash or sanitize their hands. ? Your skin will be washed with soap. ? Hair may be removed from your neck or groin.  An IV tube will be put into one of your veins.  You will be given a medicine to help you relax (sedative).  Skin on your neck or groin will be numbed.  A cut (incision) will be made in your neck or groin.  A needle will be put through your cut and into a vein in your neck or groin.  A tube (catheter) will be put into the needle. The tube will be moved to your heart. X-rays (fluoroscopy) will be used to help guide the tube.  Small devices (electrodes) on the tip of the tube will send out electrical currents.  Dye may be put through the tube. This helps your surgeon see your heart.  Electrical energy will be used to scar (ablate) some heart tissue.  Your surgeon may use: ? Heat (radiofrequency energy). ? Laser energy. ? Extreme cold (cryoablation).  The tube will be taken out.  Pressure will be held on your cut. This helps stop bleeding.  A bandage (dressing) will be put on your cut. The procedure may vary. What happens after the procedure?  You will be monitored until your medicines have worn off.  Your cut will be watched for bleeding. You will need to lie still for a few hours.  Do not drive for 24 hours or as long as your doctor tells you. Summary  Cardiac ablation is a procedure to stop some heart tissue from causing problems.  Electrical energy will be used to scar (ablate) some heart tissue. This information is not intended to replace advice given to you by your health care provider. Make sure you discuss any questions you have with your health care provider. Document Revised: 03/20/2017 Document Reviewed: 02/25/2016 Elsevier Patient Education  2020 [employer].

## 2020-01-16 NOTE — Progress Notes (Signed)
Electrophysiology Office Note   Date:  01/16/2020   ID:  Logan [name], DOB Apr 14, 1946, MRN [id number]  PCP:  [name], MD  Cardiologist:  Tobb Primary Electrophysiologist:  Eliyanna Ault Meredith Leeds, MD    Chief Complaint: AF   History of Present Illness: Logan [name] is a 74 y.o. male who is being seen today for the evaluation of AF at the request of Tobb, Kardie, DO. Presenting today for electrophysiology evaluation.  He has a history of atrial fibrillation, hypertension, hyperlipidemia, prediabetes, and obesity.  He had an attempted cardioversion, however unfortunately he was shocked 3 times with 200 J and did not convert to normal rhythm.  He has since been started on amiodarone.  Today, he denies symptoms of palpitations, chest pain,  orthopnea, PND, lower extremity edema, claudication, dizziness, presyncope, syncope, bleeding, or neurologic sequela. The patient is tolerating medications without difficulties.  His main complaint is of weakness, fatigue, and shortness of breath.  He is unclear as to the cause of this, but has noted this over the last few months.  He says he was diagnosed with atrial fibrillation many months ago.  He says that he has trouble keeping up with his grandchildren when he is taking care of them.  He has no chest pain.  He does have sleep apnea but has not been wearing his CPAP.   Past Medical History:  Diagnosis Date  . Anxiety   . Atherosclerotic renal artery stenosis, unilateral ([place]) 02/28/2014  . Atrial fibrillation ([place])   . Bilateral lower extremity edema   . Diabetes mellitus without complication ([place])   . DVT (deep venous thrombosis) ([place])   . Esophageal reflux   . Gout   . Hyperlipidemia   . Hypertension   . Morbid obesity ([place])   . Orthopnea   . Osteoarthritis   . Seasonal allergies   . Sleep apnea    Past Surgical History:  Procedure Laterality Date  . ABDOMINAL AORTAGRAM N/A 02/28/2014   Procedure: ABDOMINAL AORTAGRAM;  Surgeon:  Serafina Mitchell, MD;  Location: [facility] CATH LAB;  Service: Cardiovascular;  Laterality: N/A;  . APPENDECTOMY    . CARPAL TUNNEL RELEASE Left      Current Outpatient Medications  Medication Sig Dispense Refill  . allopurinol (ZYLOPRIM) 300 MG tablet Take 300 mg by mouth daily.    Marland Kitchen amiodarone (PACERONE) 400 MG tablet Take 1 tablet (400 mg total) by mouth daily. Take 1 tablet by mouth twice a day for 2 weeks, then decrease to 1 tablet daily 180 tablet 3  . aspirin EC 81 MG tablet Take 81 mg by mouth daily. Friday, Monday, Wednesday is the only days patient takes the aspirin    . carvedilol (COREG) 12.5 MG tablet Take 12.5 mg by mouth 2 (two) times daily with a meal.    . Cholecalciferol (VITAMIN D-3) 1000 UNITS CAPS Take 1,000 Units by mouth daily.    Marland Kitchen DHEA 25 MG CAPS Take 25 mg by mouth daily.    Marland Kitchen doxazosin (CARDURA) 8 MG tablet 8 mg daily.    . fluticasone (FLONASE) 50 MCG/ACT nasal spray Place 1 spray into both nostrils daily.    . furosemide (LASIX) 40 MG tablet Take 1.5 tablets (60 mg total) by mouth daily. Take 40 mg in the morning and 20 mg in the evening 90 tablet 3  . hydrALAZINE (APRESOLINE) 25 MG tablet Take 25 mg by mouth in the morning and at bedtime.     Marland Kitchen ketoconazole (NIZORAL) 2 %  cream Apply 1 application topically daily as needed for irritation.     Marland Kitchen loratadine (CLARITIN) 10 MG tablet Take 10 mg by mouth daily as needed for allergies.    Marland Kitchen losartan (COZAAR) 100 MG tablet Take 100 mg by mouth daily.    . meloxicam (MOBIC) 7.5 MG tablet 7.5 mg 2 (two) times daily as needed.    . nystatin-triamcinolone (MYCOLOG II) cream Apply 1 application topically 2 (two) times daily.    Marland Kitchen omeprazole (PRILOSEC) 20 MG capsule Take 20 mg by mouth daily as needed (for heartburn).     . potassium chloride SA (KLOR-CON) 20 MEQ tablet Take 1 tablet (20 mEq total) by mouth daily. 90 tablet 1  . pravastatin (PRAVACHOL) 20 MG tablet 20 mg daily.    . rivaroxaban (XARELTO) 20 MG TABS tablet Take 1  tablet (20 mg total) by mouth daily with supper. 30 tablet 6  . traMADol (ULTRAM) 50 MG tablet 50 mg daily as needed.    . vitamin B-12 (CYANOCOBALAMIN) 1000 MCG tablet Take 1,000 mcg by mouth daily.     No current facility-administered medications for this visit.    Allergies:   Patient has no known allergies.   Social History:  The patient  reports that he quit smoking about 22 years ago. His smokeless tobacco use includes chew. He reports that he does not drink alcohol and does not use drugs.   Family History:  The patient's family history includes Cancer in his father; Heart disease in his mother.    ROS:  Please see the history of present illness.   Otherwise, review of systems is positive for none.   All other systems are reviewed and negative.    PHYSICAL EXAM: VS:  BP 108/60   Pulse (!) 55   Ht 5\' 7"  (1.702 m)   Wt 225 lb (102.1 kg)   SpO2 96%   BMI 35.24 kg/m  , BMI Body mass index is 35.24 kg/m. GEN: Well nourished, well developed, in no acute distress  HEENT: normal  Neck: no JVD, carotid bruits, or masses Cardiac: irregular; no murmurs, rubs, or gallops,no edema  Respiratory:  clear to auscultation bilaterally, normal work of breathing GI: soft, nontender, nondistended, + BS MS: no deformity or atrophy  Skin: warm and dry Neuro:  Strength and sensation are intact Psych: euthymic mood, full affect  EKG:  EKG is ordered today. Personal review of the ekg ordered shows atrial fibrillation, rate 55  Recent Labs: 11/21/2019: ALT 14; TSH 1.750 12/22/2019: Hemoglobin 13.4; Magnesium 2.0; Platelets 243 01/10/2020: BUN 18; Creatinine, Ser 1.54; Potassium 4.9; Sodium 141    Lipid Panel  No results found for: CHOL, TRIG, HDL, CHOLHDL, VLDL, LDLCALC, LDLDIRECT   Wt Readings from Last 3 Encounters:  01/16/20 225 lb (102.1 kg)  12/22/19 227 lb (103 kg)  11/21/19 (!) 226 lb 3.2 oz (102.6 kg)      Other studies Reviewed: Additional studies/ records that were reviewed  today include: TTE 09/13/19  Review of the above records today demonstrates:  1. Left ventricular ejection fraction, by estimation, is 55 to 60%. The  left ventricle has normal function. The left ventricle has no regional  wall motion abnormalities. There is mild concentric left ventricular  hypertrophy. Left ventricular diastolic  parameters are indeterminate.  2. Right ventricular systolic function is mildly reduced. The right  ventricular size is mildly enlarged. Mildly increased right ventricular  wall thickness. There is normal pulmonary artery systolic pressure.  3. Right atrial  size was severely dilated.  4. The mitral valve is normal in structure. No evidence of mitral valve  regurgitation. No evidence of mitral stenosis.  5. The aortic valve is tricuspid. Aortic valve regurgitation is mild. No  aortic stenosis is present.  6. The inferior vena cava is dilated in size with >50% respiratory  variability, suggesting right atrial pressure of 8 mmHg.    ASSESSMENT AND PLAN:  1.  Persistent atrial fibrillation: Currently on amiodarone, carvedilol, Xarelto.  CHA2DS2-VASc of 2.  He has had an attempted cardioversion, but unfortunately did not convert to normal rhythm.  Fortunately his left atrium is normal.  At this point, I do feel that he would benefit from ablation.  Risk and benefits were discussed risk of bleeding, tamponade, heart block, stroke, damage to chest organs.  He understands these risks and is agreed to the procedure.  2.  Obstructive sleep apnea: We Logan [name] refer him to sleep medicine.  3.  Obesity: Diet and exercise encouraged  4.  Hypertension: Currently well controlled  Case discussed with referring cardiologist  Current medicines are reviewed at length with the patient today.   The patient does not have concerns regarding his medicines.  The following changes were made today:  none  Labs/ tests ordered today include:  Orders Placed This Encounter   Procedures  . Basic metabolic panel  . CBC  . Ambulatory referral to Cardiology  . EKG 12-Lead     Disposition:   FU with Logan [name] 3 months  Signed, Eleyna Brugh Meredith Leeds, MD  01/16/2020 11:34 AM     [facility] [address] [place] [address] [phone] (office) [phone] (fax)

## 2020-02-01 NOTE — Addendum Note (Signed)
Addended by: Stanton Kidney on: 02/01/2020 10:25 AM   Modules accepted: Orders

## 2020-02-02 DIAGNOSIS — I4819 Other persistent atrial fibrillation: Secondary | ICD-10-CM | POA: Diagnosis not present

## 2020-02-02 DIAGNOSIS — Z01812 Encounter for preprocedural laboratory examination: Secondary | ICD-10-CM | POA: Diagnosis not present

## 2020-02-02 LAB — BASIC METABOLIC PANEL
BUN/Creatinine Ratio: 13 (ref 10–24)
BUN: 18 mg/dL (ref 8–27)
CO2: 28 mmol/L (ref 20–29)
Calcium: 9.3 mg/dL (ref 8.6–10.2)
Chloride: 104 mmol/L (ref 96–106)
Creatinine, Ser: 1.41 mg/dL — ABNORMAL HIGH (ref 0.76–1.27)
GFR calc Af Amer: 57 mL/min/{1.73_m2} — ABNORMAL LOW (ref >59)
GFR calc non Af Amer: 49 mL/min/{1.73_m2} — ABNORMAL LOW (ref >59)
Glucose: 96 mg/dL (ref 65–99)
Potassium: 4.5 mmol/L (ref 3.5–5.2)
Sodium: 142 mmol/L (ref 134–144)

## 2020-02-02 LAB — CBC
Hematocrit: 36.1 % — ABNORMAL LOW (ref 37.5–51.0)
Hemoglobin: 12.4 g/dL — ABNORMAL LOW (ref 13.0–17.7)
MCH: 34.3 pg — ABNORMAL HIGH (ref 26.6–33.0)
MCHC: 34.3 g/dL (ref 31.5–35.7)
MCV: 100 fL — ABNORMAL HIGH (ref 79–97)
Platelets: 227 10*3/uL (ref 150–450)
RBC: 3.62 x10E6/uL — ABNORMAL LOW (ref 4.14–5.80)
RDW: 12.2 % (ref 11.6–15.4)
WBC: 7.2 10*3/uL (ref 3.4–10.8)

## 2020-02-06 ENCOUNTER — Telehealth: Payer: Self-pay | Admitting: Cardiology

## 2020-02-06 NOTE — Telephone Encounter (Signed)
Patient states he got a VM to call Sherri back.

## 2020-02-08 NOTE — Telephone Encounter (Signed)
Scheduled pt for virtual visit 10/26 (H&P for upcoming ablation).     Patient Consent for Virtual Visit         Logan [name] has provided verbal consent on 02/08/2020 for a virtual visit (video or telephone).   CONSENT FOR VIRTUAL VISIT FOR:  Logan [name]  By participating in this virtual visit I agree to the following:  I hereby voluntarily request, consent and authorize [facility] and its employed or contracted physicians, physician assistants, nurse practitioners or other licensed health care professionals (the Practitioner), to provide me with telemedicine health care services (the Services") as deemed necessary by the treating Practitioner. I acknowledge and consent to receive the Services by the Practitioner via telemedicine. I understand that the telemedicine visit will involve communicating with the Practitioner through live audiovisual communication technology and the disclosure of certain medical information by electronic transmission. I acknowledge that I have been given the opportunity to request an in-person assessment or other available alternative prior to the telemedicine visit and am voluntarily participating in the telemedicine visit.  I understand that I have the right to withhold or withdraw my consent to the use of telemedicine in the course of my care at any time, without affecting my right to future care or treatment, and that the Practitioner or I may terminate the telemedicine visit at any time. I understand that I have the right to inspect all information obtained and/or recorded in the course of the telemedicine visit and may receive copies of available information for a reasonable fee.  I understand that some of the potential risks of receiving the Services via telemedicine include:   Delay or interruption in medical evaluation due to technological equipment failure or disruption;  Information transmitted may not be sufficient (e.g. poor resolution of images) to  allow for appropriate medical decision making by the Practitioner; and/or   In rare instances, security protocols could fail, causing a breach of personal health information.  Furthermore, I acknowledge that it is my responsibility to provide information about my medical history, conditions and care that is complete and accurate to the best of my ability. I acknowledge that Practitioner's advice, recommendations, and/or decision may be based on factors not within their control, such as incomplete or inaccurate data provided by me or distortions of diagnostic images or specimens that may result from electronic transmissions. I understand that the practice of medicine is not an exact science and that Practitioner makes no warranties or guarantees regarding treatment outcomes. I acknowledge that a copy of this consent can be made available to me via my patient portal ([facility]), or I can request a printed copy by calling the office of [facility].    I understand that my insurance will be billed for this visit.   I have read or had this consent read to me.  I understand the contents of this consent, which adequately explains the benefits and risks of the Services being provided via telemedicine.   I have been provided ample opportunity to ask questions regarding this consent and the Services and have had my questions answered to my satisfaction.  I give my informed consent for the services to be provided through the use of telemedicine in my medical care

## 2020-02-14 ENCOUNTER — Encounter: Payer: Self-pay | Admitting: Cardiology

## 2020-02-14 ENCOUNTER — Other Ambulatory Visit: Payer: Self-pay

## 2020-02-14 ENCOUNTER — Telehealth (INDEPENDENT_AMBULATORY_CARE_PROVIDER_SITE_OTHER): Payer: Medicare HMO | Admitting: Cardiology

## 2020-02-14 DIAGNOSIS — I4819 Other persistent atrial fibrillation: Secondary | ICD-10-CM | POA: Diagnosis not present

## 2020-02-14 NOTE — H&P (View-Only) (Signed)
Electrophysiology TeleHealth Note   Due to national recommendations of social distancing due to COVID 19, an audio/video telehealth visit is felt to be most appropriate for this patient at this time.  See Epic message for the patient's consent to telehealth for [facility].   Date:  02/14/2020   ID:  Logan [name], DOB Jan 12, 1946, MRN [id number]  Location: patient's home  Provider location: [address] [place]  Evaluation Performed: Follow-up visit  PCP:  [name], MD  Cardiologist:  Berniece Salines, DO  Electrophysiologist:  Dr Curt Bears  Chief Complaint: Atrial fibrillation  History of Present Illness:    Logan [name] is a 74 y.o. male who presents via audio/video conferencing for a telehealth visit today.  Since last being seen in our clinic, the patient reports doing very well.  Today, he denies symptoms of palpitations, chest pain, shortness of breath,  lower extremity edema, dizziness, presyncope, or syncope.  The patient is otherwise without complaint today.  The patient denies symptoms of fevers, chills, cough, or new SOB worrisome for COVID 19.  He has a history of atrial fibrillation, hypertension, hyperlipidemia, prediabetes, and obesity.  He was shocked 3 times with 200 J and did not convert to sinus rhythm.  He has since been started on amiodarone.  He has plans for AF ablation.  Today, denies symptoms of palpitations, chest pain, shortness of breath, orthopnea, PND, lower extremity edema, claudication, dizziness, presyncope, syncope, bleeding, or neurologic sequela. The patient is tolerating medications without difficulties.  He continues to feel weak and fatigued, though he has no chest pain or shortness of breath.  He is ready for his ablation in November.  Past Medical History:  Diagnosis Date  . Anxiety   . Atherosclerotic renal artery stenosis, unilateral ([place]) 02/28/2014  . Atrial fibrillation ([place])   . Bilateral lower extremity edema   .  Diabetes mellitus without complication ([place])   . DVT (deep venous thrombosis) ([place])   . Esophageal reflux   . Gout   . Hyperlipidemia   . Hypertension   . Morbid obesity ([place])   . Orthopnea   . Osteoarthritis   . Seasonal allergies   . Sleep apnea     Past Surgical History:  Procedure Laterality Date  . ABDOMINAL AORTAGRAM N/A 02/28/2014   Procedure: ABDOMINAL AORTAGRAM;  Surgeon: Serafina Mitchell, MD;  Location: [facility] CATH LAB;  Service: Cardiovascular;  Laterality: N/A;  . APPENDECTOMY    . CARPAL TUNNEL RELEASE Left     Current Outpatient Medications  Medication Sig Dispense Refill  . allopurinol (ZYLOPRIM) 300 MG tablet Take 300 mg by mouth daily.    Marland Kitchen amiodarone (PACERONE) 400 MG tablet Take 1 tablet (400 mg total) by mouth daily. Take 1 tablet by mouth twice a day for 2 weeks, then decrease to 1 tablet daily 180 tablet 3  . aspirin EC 81 MG tablet Take 81 mg by mouth daily. Friday, Monday, Wednesday is the only days patient takes the aspirin    . carvedilol (COREG) 12.5 MG tablet Take 12.5 mg by mouth 2 (two) times daily with a meal.    . Cholecalciferol (VITAMIN D-3) 1000 UNITS CAPS Take 1,000 Units by mouth daily.    Marland Kitchen DHEA 25 MG CAPS Take 25 mg by mouth daily.    Marland Kitchen doxazosin (CARDURA) 8 MG tablet 8 mg daily.    . fluticasone (FLONASE) 50 MCG/ACT nasal spray Place 1 spray into both nostrils daily.    . furosemide (  LASIX) 40 MG tablet Take 1.5 tablets (60 mg total) by mouth daily. Take 40 mg in the morning and 20 mg in the evening 90 tablet 3  . hydrALAZINE (APRESOLINE) 25 MG tablet Take 25 mg by mouth in the morning and at bedtime.     Marland Kitchen ketoconazole (NIZORAL) 2 % cream Apply 1 application topically daily as needed for irritation.     Marland Kitchen loratadine (CLARITIN) 10 MG tablet Take 10 mg by mouth daily as needed for allergies.    Marland Kitchen losartan (COZAAR) 100 MG tablet Take 100 mg by mouth daily.    . meloxicam (MOBIC) 7.5 MG tablet 7.5 mg 2 (two) times daily as needed.    .  nystatin-triamcinolone (MYCOLOG II) cream Apply 1 application topically 2 (two) times daily.    Marland Kitchen omeprazole (PRILOSEC) 20 MG capsule Take 20 mg by mouth daily as needed (for heartburn).     . potassium chloride SA (KLOR-CON) 20 MEQ tablet Take 1 tablet (20 mEq total) by mouth daily. 90 tablet 1  . pravastatin (PRAVACHOL) 20 MG tablet 20 mg daily.    . rivaroxaban (XARELTO) 20 MG TABS tablet Take 1 tablet (20 mg total) by mouth daily with supper. 30 tablet 6  . traMADol (ULTRAM) 50 MG tablet 50 mg daily as needed.    . vitamin B-12 (CYANOCOBALAMIN) 1000 MCG tablet Take 1,000 mcg by mouth daily.     No current facility-administered medications for this visit.    Allergies:   Patient has no known allergies.   Social History:  The patient  reports that he quit smoking about 23 years ago. His smokeless tobacco use includes chew. He reports that he does not drink alcohol and does not use drugs.   Family History:  The patient's  family history includes Cancer in his father; Heart disease in his mother.   ROS:  Please see the history of present illness.   All other systems are personally reviewed and negative.    Exam:    Vital Signs:  There were no vitals taken for this visit.   no acute distress, no shortness of breath.  Labs/Other Tests and Data Reviewed:    Recent Labs: 11/21/2019: ALT 14; TSH 1.750 12/22/2019: Magnesium 2.0 02/02/2020: BUN 18; Creatinine, Ser 1.41; Hemoglobin 12.4; Platelets 227; Potassium 4.5; Sodium 142   Wt Readings from Last 3 Encounters:  01/16/20 225 lb (102.1 kg)  12/22/19 227 lb (103 kg)  11/21/19 (!) 226 lb 3.2 oz (102.6 kg)     Other studies personally reviewed: Additional studies/ records that were reviewed today include: ECG 01/16/2020 personally reviewed Review of the above records today demonstrates: Atrial fibrillation rate 55   ASSESSMENT & PLAN:    1.  Persistent atrial fibrillation: Currently on amiodarone, carvedilol, Xarelto.  CHA2DS2-VASc  of 2.  He had attempted cardioversion but did not convert to normal rhythm.  His left atrium is normal.  He would benefit from ablation.  Risks and benefits have been discussed include bleeding, tamponade, heart block, stroke, damage to chest organs.  He understands these risks and is agreed to the procedure.  2.  Obstructive sleep apnea: CPAP compliance encouraged  3.  Obesity: Diet and exercise encouraged  4.  Hypertension: Well controlled in the past.  No changes.   COVID 19 screen The patient denies symptoms of COVID 19 at this time.  The importance of social distancing was discussed today.  Follow-up:  3 months  Current medicines are reviewed at length with the  patient today.   The patient does not have concerns regarding his medicines.  The following changes were made today:  none  Labs/ tests ordered today include:  No orders of the defined types were placed in this encounter.    Patient Risk:  after full review of this patients clinical status, I feel that they are at moderate risk at this time.  Today, I have spent 10 minutes with the patient with telehealth technology discussing AF .    Signed, Rashawn Rayman Meredith Leeds, MD  02/14/2020 8:32 AM     [facility] [address] [place] [address] [phone] (office) [phone] (fax)

## 2020-02-14 NOTE — Progress Notes (Signed)
Electrophysiology TeleHealth Note   Due to national recommendations of social distancing due to COVID 19, an audio/video telehealth visit is felt to be most appropriate for this patient at this time.  See Epic message for the patient's consent to telehealth for [facility].   Date:  02/14/2020   ID:  Logan [name], DOB 09-28-1945, MRN [id number]  Location: patient's home  Provider location: [address] [place]  Evaluation Performed: Follow-up visit  PCP:  [name], MD  Cardiologist:  Berniece Salines, DO  Electrophysiologist:  Dr Curt Bears  Chief Complaint: Atrial fibrillation  History of Present Illness:    Logan [name] is a 74 y.o. male who presents via audio/video conferencing for a telehealth visit today.  Since last being seen in our clinic, the patient reports doing very well.  Today, he denies symptoms of palpitations, chest pain, shortness of breath,  lower extremity edema, dizziness, presyncope, or syncope.  The patient is otherwise without complaint today.  The patient denies symptoms of fevers, chills, cough, or new SOB worrisome for COVID 19.  He has a history of atrial fibrillation, hypertension, hyperlipidemia, prediabetes, and obesity.  He was shocked 3 times with 200 J and did not convert to sinus rhythm.  He has since been started on amiodarone.  He has plans for AF ablation.  Today, denies symptoms of palpitations, chest pain, shortness of breath, orthopnea, PND, lower extremity edema, claudication, dizziness, presyncope, syncope, bleeding, or neurologic sequela. The patient is tolerating medications without difficulties.  He continues to feel weak and fatigued, though he has no chest pain or shortness of breath.  He is ready for his ablation in November.  Past Medical History:  Diagnosis Date  . Anxiety   . Atherosclerotic renal artery stenosis, unilateral ([place]) 02/28/2014  . Atrial fibrillation ([place])   . Bilateral lower extremity edema   .  Diabetes mellitus without complication ([place])   . DVT (deep venous thrombosis) ([place])   . Esophageal reflux   . Gout   . Hyperlipidemia   . Hypertension   . Morbid obesity ([place])   . Orthopnea   . Osteoarthritis   . Seasonal allergies   . Sleep apnea     Past Surgical History:  Procedure Laterality Date  . ABDOMINAL AORTAGRAM N/A 02/28/2014   Procedure: ABDOMINAL AORTAGRAM;  Surgeon: Serafina Mitchell, MD;  Location: [facility] CATH LAB;  Service: Cardiovascular;  Laterality: N/A;  . APPENDECTOMY    . CARPAL TUNNEL RELEASE Left     Current Outpatient Medications  Medication Sig Dispense Refill  . allopurinol (ZYLOPRIM) 300 MG tablet Take 300 mg by mouth daily.    Marland Kitchen amiodarone (PACERONE) 400 MG tablet Take 1 tablet (400 mg total) by mouth daily. Take 1 tablet by mouth twice a day for 2 weeks, then decrease to 1 tablet daily 180 tablet 3  . aspirin EC 81 MG tablet Take 81 mg by mouth daily. Friday, Monday, Wednesday is the only days patient takes the aspirin    . carvedilol (COREG) 12.5 MG tablet Take 12.5 mg by mouth 2 (two) times daily with a meal.    . Cholecalciferol (VITAMIN D-3) 1000 UNITS CAPS Take 1,000 Units by mouth daily.    Marland Kitchen DHEA 25 MG CAPS Take 25 mg by mouth daily.    Marland Kitchen doxazosin (CARDURA) 8 MG tablet 8 mg daily.    . fluticasone (FLONASE) 50 MCG/ACT nasal spray Place 1 spray into both nostrils daily.    . furosemide (  LASIX) 40 MG tablet Take 1.5 tablets (60 mg total) by mouth daily. Take 40 mg in the morning and 20 mg in the evening 90 tablet 3  . hydrALAZINE (APRESOLINE) 25 MG tablet Take 25 mg by mouth in the morning and at bedtime.     Marland Kitchen ketoconazole (NIZORAL) 2 % cream Apply 1 application topically daily as needed for irritation.     Marland Kitchen loratadine (CLARITIN) 10 MG tablet Take 10 mg by mouth daily as needed for allergies.    Marland Kitchen losartan (COZAAR) 100 MG tablet Take 100 mg by mouth daily.    . meloxicam (MOBIC) 7.5 MG tablet 7.5 mg 2 (two) times daily as needed.    .  nystatin-triamcinolone (MYCOLOG II) cream Apply 1 application topically 2 (two) times daily.    Marland Kitchen omeprazole (PRILOSEC) 20 MG capsule Take 20 mg by mouth daily as needed (for heartburn).     . potassium chloride SA (KLOR-CON) 20 MEQ tablet Take 1 tablet (20 mEq total) by mouth daily. 90 tablet 1  . pravastatin (PRAVACHOL) 20 MG tablet 20 mg daily.    . rivaroxaban (XARELTO) 20 MG TABS tablet Take 1 tablet (20 mg total) by mouth daily with supper. 30 tablet 6  . traMADol (ULTRAM) 50 MG tablet 50 mg daily as needed.    . vitamin B-12 (CYANOCOBALAMIN) 1000 MCG tablet Take 1,000 mcg by mouth daily.     No current facility-administered medications for this visit.    Allergies:   Patient has no known allergies.   Social History:  The patient  reports that he quit smoking about 23 years ago. His smokeless tobacco use includes chew. He reports that he does not drink alcohol and does not use drugs.   Family History:  The patient's  family history includes Cancer in his father; Heart disease in his mother.   ROS:  Please see the history of present illness.   All other systems are personally reviewed and negative.    Exam:    Vital Signs:  There were no vitals taken for this visit.   no acute distress, no shortness of breath.  Labs/Other Tests and Data Reviewed:    Recent Labs: 11/21/2019: ALT 14; TSH 1.750 12/22/2019: Magnesium 2.0 02/02/2020: BUN 18; Creatinine, Ser 1.41; Hemoglobin 12.4; Platelets 227; Potassium 4.5; Sodium 142   Wt Readings from Last 3 Encounters:  01/16/20 225 lb (102.1 kg)  12/22/19 227 lb (103 kg)  11/21/19 (!) 226 lb 3.2 oz (102.6 kg)     Other studies personally reviewed: Additional studies/ records that were reviewed today include: ECG 01/16/2020 personally reviewed Review of the above records today demonstrates: Atrial fibrillation rate 55   ASSESSMENT & PLAN:    1.  Persistent atrial fibrillation: Currently on amiodarone, carvedilol, Xarelto.  CHA2DS2-VASc  of 2.  He had attempted cardioversion but did not convert to normal rhythm.  His left atrium is normal.  He would benefit from ablation.  Risks and benefits have been discussed include bleeding, tamponade, heart block, stroke, damage to chest organs.  He understands these risks and is agreed to the procedure.  2.  Obstructive sleep apnea: CPAP compliance encouraged  3.  Obesity: Diet and exercise encouraged  4.  Hypertension: Well controlled in the past.  No changes.   COVID 19 screen The patient denies symptoms of COVID 19 at this time.  The importance of social distancing was discussed today.  Follow-up:  3 months  Current medicines are reviewed at length with the  patient today.   The patient does not have concerns regarding his medicines.  The following changes were made today:  none  Labs/ tests ordered today include:  No orders of the defined types were placed in this encounter.    Patient Risk:  after full review of this patients clinical status, I feel that they are at moderate risk at this time.  Today, I have spent 10 minutes with the patient with telehealth technology discussing AF .    Signed, Colleene Swarthout Meredith Leeds, MD  02/14/2020 8:32 AM     [facility] [address] [place] [address] [phone] (office) [phone] (fax)

## 2020-02-16 ENCOUNTER — Other Ambulatory Visit: Payer: Self-pay

## 2020-02-16 ENCOUNTER — Ambulatory Visit: Payer: Medicare HMO | Admitting: Cardiology

## 2020-02-16 ENCOUNTER — Encounter: Payer: Self-pay | Admitting: Cardiology

## 2020-02-16 VITALS — BP 132/60 | HR 72 | Ht 68.0 in | Wt 221.8 lb

## 2020-02-16 DIAGNOSIS — I10 Essential (primary) hypertension: Secondary | ICD-10-CM | POA: Diagnosis not present

## 2020-02-16 DIAGNOSIS — I5032 Chronic diastolic (congestive) heart failure: Secondary | ICD-10-CM | POA: Diagnosis not present

## 2020-02-16 DIAGNOSIS — E669 Obesity, unspecified: Secondary | ICD-10-CM

## 2020-02-16 DIAGNOSIS — I4819 Other persistent atrial fibrillation: Secondary | ICD-10-CM

## 2020-02-16 NOTE — Patient Instructions (Signed)
Medication Instructions:  Your physician recommends that you continue on your current medications as directed. Please refer to the Current Medication list given to you today.  *If you need a refill on your cardiac medications before your next appointment, please call your pharmacy*   Lab Work: Your physician recommends that you return for lab work in: TODAY BMP, Mag If you have labs (blood work) drawn today and your tests are completely normal, you will receive your results only by: . MyChart Message (if you have MyChart) OR . A paper copy in the mail If you have any lab test that is abnormal or we need to change your treatment, we will call you to review the results.   Testing/Procedures: None   Follow-Up: At [facility], you and your health needs are our priority.  As part of our continuing mission to provide you with exceptional heart care, we have created designated Provider Care Teams.  These Care Teams include your primary Cardiologist (physician) and Advanced Practice Providers (APPs -  Physician Assistants and Nurse Practitioners) who all work together to provide you with the care you need, when you need it.  We recommend signing up for the patient portal called "MyChart".  Sign up information is provided on this After Visit Summary.  MyChart is used to connect with patients for Virtual Visits (Telemedicine).  Patients are able to view lab/test results, encounter notes, upcoming appointments, etc.  Non-urgent messages can be sent to your provider as well.   To learn more about what you can do with MyChart, go to [email or web address].    Your next appointment:   3 month(s)  The format for your next appointment:   In Person  Provider:   Kardie Tobb, DO   Other Instructions   

## 2020-02-16 NOTE — Progress Notes (Signed)
Cardiology Office Note:    Date:  02/16/2020   ID:  [name], DOB 05-01-1945, MRN [id number]  PCP:  [name], MD  Cardiologist:  Berniece Salines, DO  Electrophysiologist:  Constance Haw, MD   Referring MD: [name], MD    " I am doing well"   History of Present Illness:    Logan [name] a 74 y.o.malewith a hx of of atrial fibrillation on Xarelto and carvedilol, hypertension, hyperlipidemia, prediabetes and morbid obesity.   I saw the patient in  November 21, 2019 at that time he was status post cardioversion however he was shocked 3 times with 200 J but unfortunately patient did not convert into sinus rhythm.  At his last visit I started the patient amiodarone hoping to get him converted to sinus rhythm.  During that time he was planning colonoscopy I did asked the patient to please wait until after August 20 which should been a good 4 weeks after his procedure.    The patient was seen in the December 22, 2019 at that time he still was in atrial fibrillation despite amiodarone.  Since his visit he has seen EP and plan for cardioversion which is scheduled for November 10. Patient tells me today that he still feels significant fatigue and shortness of breath.  He is looking forward to his cardioversion.   Past Medical History:  Diagnosis Date  . Anxiety   . Atherosclerotic renal artery stenosis, unilateral ([place]) 02/28/2014  . Atrial fibrillation ([place])   . Bilateral lower extremity edema   . Chronic diastolic heart failure ([place]) 10/21/2019  . Diabetes mellitus without complication ([place])   . DVT (deep venous thrombosis) ([place])   . Esophageal reflux   . Essential hypertension 10/21/2019  . Gout   . Hyperlipidemia   . Hypertension   . Mixed hyperlipidemia 10/21/2019  . Morbid obesity ([place])   . Obesity (BMI 30-39.9) 08/24/2019  . Orthopnea   . Osteoarthritis   . Persistent atrial fibrillation ([place]) 10/21/2019  . Seasonal allergies   . Sleep apnea     Past  Surgical History:  Procedure Laterality Date  . ABDOMINAL AORTAGRAM N/A 02/28/2014   Procedure: ABDOMINAL AORTAGRAM;  Surgeon: Serafina Mitchell, MD;  Location: [facility] CATH LAB;  Service: Cardiovascular;  Laterality: N/A;  . APPENDECTOMY    . CARPAL TUNNEL RELEASE Left     Current Medications: Current Meds  Medication Sig  . allopurinol (ZYLOPRIM) 300 MG tablet Take 300 mg by mouth daily.  Marland Kitchen amiodarone (PACERONE) 400 MG tablet Take 1 tablet (400 mg total) by mouth daily. Take 1 tablet by mouth twice a day for 2 weeks, then decrease to 1 tablet daily  . aspirin EC 81 MG tablet Take 81 mg by mouth daily. Friday, Monday, Wednesday is the only days patient takes the aspirin  . carvedilol (COREG) 12.5 MG tablet Take 12.5 mg by mouth 2 (two) times daily with a meal.  . Cholecalciferol (VITAMIN D-3) 1000 UNITS CAPS Take 1,000 Units by mouth daily.  Marland Kitchen DHEA 25 MG CAPS Take 25 mg by mouth daily.  Marland Kitchen doxazosin (CARDURA) 8 MG tablet 8 mg daily.  . fluticasone (FLONASE) 50 MCG/ACT nasal spray Place 1 spray into both nostrils daily.  . furosemide (LASIX) 40 MG tablet Take 1.5 tablets (60 mg total) by mouth daily. Take 40 mg in the morning and 20 mg in the evening  . hydrALAZINE (APRESOLINE) 25 MG tablet Take 25 mg by mouth in the morning and at bedtime.   Marland Kitchen  ketoconazole (NIZORAL) 2 % cream Apply 1 application topically daily as needed for irritation.   Marland Kitchen loratadine (CLARITIN) 10 MG tablet Take 10 mg by mouth daily as needed for allergies.  Marland Kitchen losartan (COZAAR) 100 MG tablet Take 100 mg by mouth daily.  . meloxicam (MOBIC) 7.5 MG tablet 7.5 mg 2 (two) times daily as needed.  . nystatin-triamcinolone (MYCOLOG II) cream Apply 1 application topically 2 (two) times daily.  Marland Kitchen omeprazole (PRILOSEC) 20 MG capsule Take 20 mg by mouth daily as needed (for heartburn).   . potassium chloride SA (KLOR-CON) 20 MEQ tablet Take 1 tablet (20 mEq total) by mouth daily.  . pravastatin (PRAVACHOL) 20 MG tablet 20 mg daily.  .  rivaroxaban (XARELTO) 20 MG TABS tablet Take 1 tablet (20 mg total) by mouth daily with supper.  . traMADol (ULTRAM) 50 MG tablet 50 mg daily as needed.  . vitamin B-12 (CYANOCOBALAMIN) 1000 MCG tablet Take 1,000 mcg by mouth daily.     Allergies:   Patient has no known allergies.   Social History   Socioeconomic History  . Marital status: Widowed    Spouse name: Not on file  . Number of children: Not on file  . Years of education: Not on file  . Highest education level: Not on file  Occupational History  . Not on file  Tobacco Use  . Smoking status: Former Smoker    Quit date: 02/07/1997    Years since quitting: 23.0  . Smokeless tobacco: Current User    Types: Chew  Substance and Sexual Activity  . Alcohol use: Never  . Drug use: Never  . Sexual activity: Not on file  Other Topics Concern  . Not on file  Social History Narrative  . Not on file   Social Determinants of Health   Financial Resource Strain:   . Difficulty of Paying Living Expenses: Not on file  Food Insecurity:   . Worried About Charity fundraiser in the Last Year: Not on file  . Ran Out of Food in the Last Year: Not on file  Transportation Needs:   . Lack of Transportation (Medical): Not on file  . Lack of Transportation (Non-Medical): Not on file  Physical Activity:   . Days of Exercise per Week: Not on file  . Minutes of Exercise per Session: Not on file  Stress:   . Feeling of Stress : Not on file  Social Connections:   . Frequency of Communication with Friends and Family: Not on file  . Frequency of Social Gatherings with Friends and Family: Not on file  . Attends Religious Services: Not on file  . Active Member of Clubs or Organizations: Not on file  . Attends Archivist Meetings: Not on file  . Marital Status: Not on file     Family History: The patient's family history includes Cancer in his father; Heart disease in his mother.  ROS:   Review of Systems  Constitution:  Negative for decreased appetite, fever and weight gain.  HENT: Negative for congestion, ear discharge, hoarse voice and sore throat.   Eyes: Negative for discharge, redness, vision loss in right eye and visual halos.  Cardiovascular: Negative for chest pain, dyspnea on exertion, leg swelling, orthopnea and palpitations.  Respiratory: Negative for cough, hemoptysis, shortness of breath and snoring.   Endocrine: Negative for heat intolerance and polyphagia.  Hematologic/Lymphatic: Negative for bleeding problem. Does not bruise/bleed easily.  Skin: Negative for flushing, nail changes, rash and suspicious  lesions.  Musculoskeletal: Negative for arthritis, joint pain, muscle cramps, myalgias, neck pain and stiffness.  Gastrointestinal: Negative for abdominal pain, bowel incontinence, diarrhea and excessive appetite.  Genitourinary: Negative for decreased libido, genital sores and incomplete emptying.  Neurological: Negative for brief paralysis, focal weakness, headaches and loss of balance.  Psychiatric/Behavioral: Negative for altered mental status, depression and suicidal ideas.  Allergic/Immunologic: Negative for HIV exposure and persistent infections.    EKGs/Labs/Other Studies Reviewed:    The following studies were reviewed today:   EKG: None today.   Recent Labs: 11/21/2019: ALT 14; TSH 1.750 12/22/2019: Magnesium 2.0 02/02/2020: BUN 18; Creatinine, Ser 1.41; Hemoglobin 12.4; Platelets 227; Potassium 4.5; Sodium 142  Recent Lipid Panel No results found for: CHOL, TRIG, HDL, CHOLHDL, VLDL, LDLCALC, LDLDIRECT  Physical Exam:    VS:  BP 132/60   Pulse 72   Ht 5\' 8"  (1.727 m)   Wt 221 lb 12.8 oz (100.6 kg)   SpO2 96%   BMI 33.72 kg/m     Wt Readings from Last 3 Encounters:  02/16/20 221 lb 12.8 oz (100.6 kg)  01/16/20 225 lb (102.1 kg)  12/22/19 227 lb (103 kg)     GEN: Well nourished, well developed in no acute distress HEENT: Normal NECK: No JVD; No carotid  bruits LYMPHATICS: No lymphadenopathy CARDIAC: S1S2 noted,RRR, no murmurs, rubs, gallops RESPIRATORY:  Clear to auscultation without rales, wheezing or rhonchi  ABDOMEN: Soft, non-tender, non-distended, +bowel sounds, no guarding. EXTREMITIES: No edema, No cyanosis, no clubbing MUSCULOSKELETAL:  No deformity  SKIN: Warm and dry NEUROLOGIC:  Alert and oriented x 3, non-focal PSYCHIATRIC:  Normal affect, good insight  ASSESSMENT:    1. Essential hypertension   2. Persistent atrial fibrillation ([place])   3. Chronic diastolic heart failure ([place])   4. Primary hypertension    PLAN:     1.  He still appears to be symptomatic with shortness of breath and fatigue.  He is looking forward to his A. fib ablation given his failed cardioversion as well as antiarrhythmics failure.  His leg edema has improved and we will keep him on his current dose of diuretic. Blood pressure is also acceptable.   The patient understands the need to lose weight with diet and exercise. We have discussed specific strategies for this.  Pleural be done to assess BMP and mag.  The patient is in agreement with the above plan. The patient left the office in stable condition.  The patient will follow up in 3 months or sooner if needed.  Medication Adjustments/Labs and Tests Ordered: Current medicines are reviewed at length with the patient today.  Concerns regarding medicines are outlined above.  Orders Placed This Encounter  Procedures  . Basic metabolic panel  . Magnesium   No orders of the defined types were placed in this encounter.   Patient Instructions  Medication Instructions:  Your physician recommends that you continue on your current medications as directed. Please refer to the Current Medication list given to you today.  *If you need a refill on your cardiac medications before your next appointment, please call your pharmacy*   Lab Work: Your physician recommends that you return for lab work in:  [place] If you have labs (blood work) drawn today and your tests are completely normal, you will receive your results only by: Marland Kitchen MyChart Message (if you have MyChart) OR . A paper copy in the mail If you have any lab test that is abnormal or we need  to change your treatment, we will call you to review the results.   Testing/Procedures: None   Follow-Up: At [facility], you and your health needs are our priority.  As part of our continuing mission to provide you with exceptional heart care, we have created designated Provider Care Teams.  These Care Teams include your primary Cardiologist (physician) and Advanced Practice Providers (APPs -  Physician Assistants and Nurse Practitioners) who all work together to provide you with the care you need, when you need it.  We recommend signing up for the patient portal called "MyChart".  Sign up information is provided on this After Visit Summary.  MyChart is used to connect with patients for Virtual Visits (Telemedicine).  Patients are able to view lab/test results, encounter notes, upcoming appointments, etc.  Non-urgent messages can be sent to your provider as well.   To learn more about what you can do with MyChart, go to [email or web address].    Your next appointment:   3 month(s)  The format for your next appointment:   In Person  Provider:   Berniece Salines, DO   Other Instructions      Adopting a Healthy Lifestyle.  Know what a healthy weight is for you (roughly BMI <25) and aim to maintain this   Aim for 7+ servings of fruits and vegetables daily   65-80+ fluid ounces of water or unsweet tea for healthy kidneys   Limit to max 1 drink of alcohol per day; avoid smoking/tobacco   Limit animal fats in diet for cholesterol and heart health - choose grass fed whenever available   Avoid highly processed foods, and foods high in saturated/trans fats   Aim for low stress - take time to unwind and care for your mental  health   Aim for 150 min of moderate intensity exercise weekly for heart health, and weights twice weekly for bone health   Aim for 7-9 hours of sleep daily   When it comes to diets, agreement about the perfect plan isnt easy to find, even among the experts. Experts at the [employer] developed an idea known as the Healthy Eating Plate. Just imagine a plate divided into logical, healthy portions.   The emphasis is on diet quality:   Load up on vegetables and fruits - one-half of your plate: Aim for color and variety, and remember that potatoes dont count.   Go for whole grains - one-quarter of your plate: Whole wheat, barley, wheat berries, quinoa, oats, brown rice, and foods made with them. If you want pasta, go with whole wheat pasta.   Protein power - one-quarter of your plate: Fish, chicken, beans, and nuts are all healthy, versatile protein sources. Limit red meat.   The diet, however, does go beyond the plate, offering a few other suggestions.   Use healthy plant oils, such as olive, canola, soy, corn, sunflower and peanut. Check the labels, and avoid partially hydrogenated oil, which have unhealthy trans fats.   If youre thirsty, drink water. Coffee and tea are good in moderation, but skip sugary drinks and limit milk and dairy products to one or two daily servings.   The type of carbohydrate in the diet is more important than the amount. Some sources of carbohydrates, such as vegetables, fruits, whole grains, and beans-are healthier than others.   Finally, stay active  Signed, Berniece Salines, DO  02/16/2020 1:13 PM    [facility]

## 2020-02-17 ENCOUNTER — Telehealth: Payer: Self-pay

## 2020-02-17 ENCOUNTER — Telehealth: Payer: Self-pay | Admitting: Cardiology

## 2020-02-17 LAB — MAGNESIUM: Magnesium: 1.9 mg/dL (ref 1.6–2.3)

## 2020-02-17 LAB — BASIC METABOLIC PANEL
BUN/Creatinine Ratio: 18 (ref 10–24)
BUN: 28 mg/dL — ABNORMAL HIGH (ref 8–27)
CO2: 26 mmol/L (ref 20–29)
Calcium: 9.3 mg/dL (ref 8.6–10.2)
Chloride: 103 mmol/L (ref 96–106)
Creatinine, Ser: 1.52 mg/dL — ABNORMAL HIGH (ref 0.76–1.27)
GFR calc Af Amer: 52 mL/min/{1.73_m2} — ABNORMAL LOW (ref >59)
GFR calc non Af Amer: 45 mL/min/{1.73_m2} — ABNORMAL LOW (ref >59)
Glucose: 94 mg/dL (ref 65–99)
Potassium: 4.1 mmol/L (ref 3.5–5.2)
Sodium: 143 mmol/L (ref 134–144)

## 2020-02-17 MED ORDER — FUROSEMIDE 40 MG PO TABS: 40.0000 mg | ORAL_TABLET | ORAL | 3 refills | Status: DC

## 2020-02-17 NOTE — Telephone Encounter (Signed)
Patient returning call for lab results. 

## 2020-02-17 NOTE — Telephone Encounter (Signed)
Pt verbalized understanding of his lab results. He will call if he has any further questions.

## 2020-02-17 NOTE — Telephone Encounter (Signed)
-----   Message from Berniece Salines, DO sent at 02/17/2020 12:49 PM EDT ----- Have a cut back on his Lasix to 40 mg every other day.  Creatinine slightly elevated.

## 2020-02-17 NOTE — Telephone Encounter (Signed)
Pt called see lab results encounter.

## 2020-02-21 ENCOUNTER — Telehealth (HOSPITAL_COMMUNITY): Payer: Self-pay | Admitting: Emergency Medicine

## 2020-02-21 NOTE — Telephone Encounter (Signed)
Attempted to call patient regarding upcoming cardiac CT appointment. °Left message on voicemail with name and callback number °Zelma Snead RN Navigator Cardiac Imaging °[facility] °[phone] Office °[phone] Cell ° °

## 2020-02-23 ENCOUNTER — Ambulatory Visit (HOSPITAL_COMMUNITY)
Admission: RE | Admit: 2020-02-23 | Discharge: 2020-02-23 | Disposition: A | Discharge status: Home / Self Care | Payer: Medicare HMO | Source: Ambulatory Visit | Attending: Cardiology | Admitting: Cardiology

## 2020-02-23 ENCOUNTER — Other Ambulatory Visit: Payer: Self-pay

## 2020-02-23 DIAGNOSIS — I4819 Other persistent atrial fibrillation: Secondary | ICD-10-CM | POA: Diagnosis not present

## 2020-02-23 IMAGING — CT CT HEART MORPH/PULM VEIN W/ CM & W/O CA SCORE
2 of 14 series · 4 of 20 positions shown, 5 images · IV contrast (APPLIED)
Comparison: None.
COMPARISON: None.

Addendum:
EXAM:
OVER-READ INTERPRETATION  CT CHEST

The following report is an over-read performed by radiologist Dr.
KARIMI [REDACTED] on [DATE]. This
over-read does not include interpretation of cardiac or coronary
anatomy or pathology. The coronary calcium score/coronary CTA
interpretation by the cardiologist is attached.
CLINICAL DATA: 74M with atrial fibrillation scheduled for an
ablation.
Cardiac CT/CTA
TECHNIQUE: The patient was scanned on a Siemens Somatom scanner.

[Series 9: 0-95% · axial · 0.39mm/px · z∈[-379,-334]mm · 2 of 2710 slices shown, 3 images]
[im 904/2710  vessel]
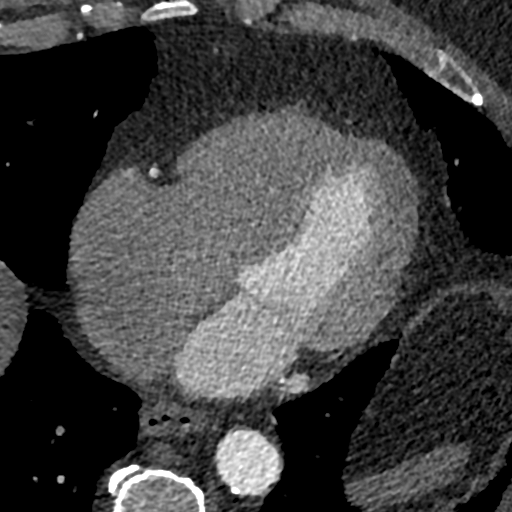
[im 904/2710  lung]
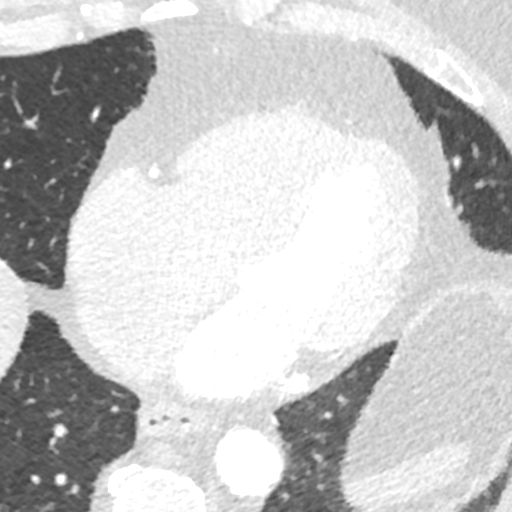
[im 1807/2710  vessel]
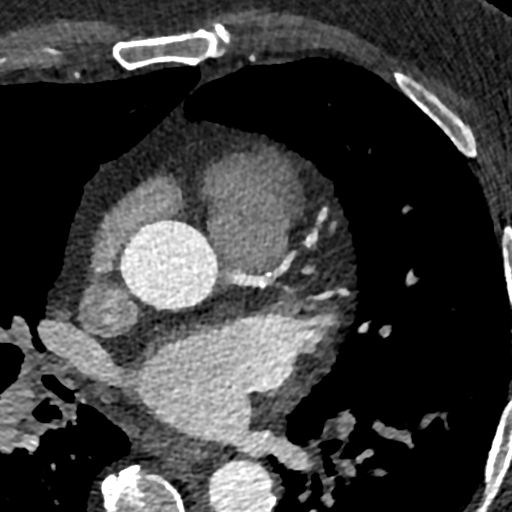

[Series 14: 5-95% · axial · 0.39mm/px · z∈[-379,-334]mm · 2 of 2710 slices shown]
[im 904/2710  vessel]
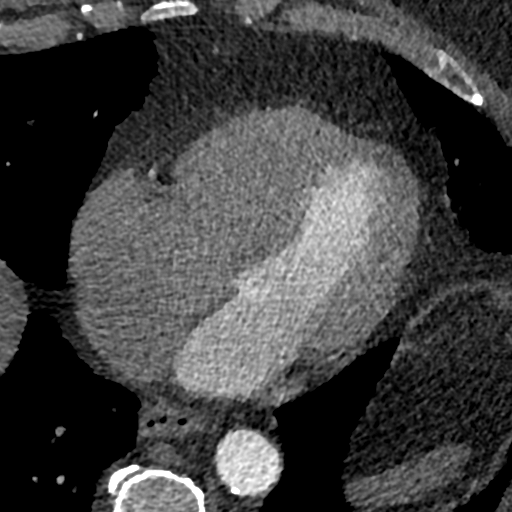
[im 1807/2710  vessel]
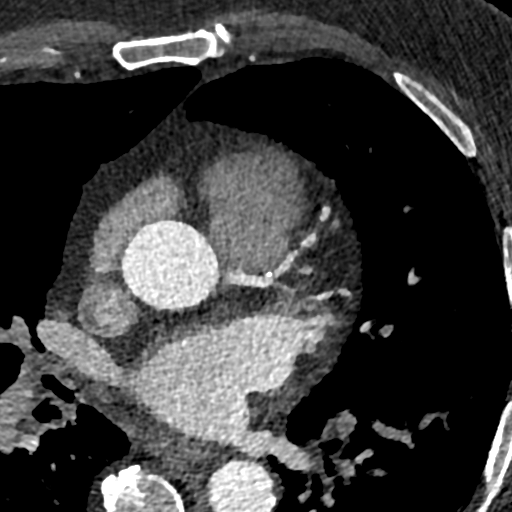

[4 of 20 positions shown; findings below may reference images not displayed]

FINDINGS: Within the visualized portions of the thorax there are no suspicious
appearing pulmonary nodules or masses, there is no acute
consolidative airspace disease, no pleural effusions, no
pneumothorax and no lymphadenopathy. Visualized portions of the
upper abdomen are unremarkable. There are no aggressive appearing
lytic or blastic lesions noted in the visualized portions of the
skeleton.
IMPRESSION: No significant incidental noncardiac findings are noted.
FINDINGS: A 120 kV prospective scan was triggered in the descending thoracic
aorta at 111 HU's. Gantry rotation speed was 280 msecs and
collimation was .9 mm. No beta blockade and no NTG was given. The 3D
data set was reconstructed in 5% intervals of the 60-80 % of the R-R
cycle. Diastolic phases were analyzed on a dedicated work station
using MPR, MIP and VRT modes. The patient received 80 cc of
contrast.

There is normal pulmonary vein drainage into the left atrium (2 on
the right and 2 on the left) with ostial measurements as follows:

RUPV: 23.1 x 17.7 mm

RLPV: 23.1 x 20.3 mm

LUPV: 28.8 x 20.5 mm

LLPV: 16.2 x 14.9 mm

The left atrial appendage is large windsock type with two lobes and
ostial size 36 x 25 mm and length 37 mm. There is no thrombus in the
left atrial appendage.

The esophagus runs in the left atrial midline and is not in the
proximity to any of the pulmonary veins.

Aorta: Normal caliber. Ascending aorta 3.5 cm. No dissection.
Calcification noted in the ascending and descending aorta.

Aortic Valve:  Trileaflet.  No calcifications.

Coronary Arteries: Normal coronary origin. Right dominance. The
study was performed without use of NTG and insufficient for plaque
evaluation. Calcification noted in the LAD, RI, LCX, and RCA.
IMPRESSION: 1. There is normal pulmonary vein drainage into the left atrium.

2. The left atrial appendage is large windsock type with two lobes
and ostial size 36 x 25 mm and length 37 mm. There is no thrombus in
the left atrial appendage.

3. The esophagus runs in the left atrial midline and is not in the
proximity to any of the pulmonary veins.

4. Coronary calcification noted in all three coronary distributions.
Coronary calcium score 17.2. This is 19th percentile in age/sex
based comparison.

*** End of Addendum ***
EXAM:
OVER-READ INTERPRETATION  CT CHEST

The following report is an over-read performed by radiologist Dr.
KARIMI [REDACTED] on [DATE]. This
over-read does not include interpretation of cardiac or coronary
anatomy or pathology. The coronary calcium score/coronary CTA
interpretation by the cardiologist is attached.
FINDINGS: Within the visualized portions of the thorax there are no suspicious
appearing pulmonary nodules or masses, there is no acute
consolidative airspace disease, no pleural effusions, no
pneumothorax and no lymphadenopathy. Visualized portions of the
upper abdomen are unremarkable. There are no aggressive appearing
lytic or blastic lesions noted in the visualized portions of the
skeleton.
IMPRESSION: No significant incidental noncardiac findings are noted.

## 2020-02-23 MED ORDER — IOHEXOL 350 MG/ML SOLN
80.0000 mL | Freq: Once | INTRAVENOUS | Status: AC | PRN
  Administered 2020-02-23: 80 mL via INTRAVENOUS

## 2020-02-27 ENCOUNTER — Other Ambulatory Visit (HOSPITAL_COMMUNITY)
Admission: RE | Admit: 2020-02-27 | Discharge: 2020-02-27 | Disposition: A | Discharge status: Home / Self Care | Payer: Medicare HMO | Source: Ambulatory Visit | Attending: Cardiology | Admitting: Cardiology

## 2020-02-27 DIAGNOSIS — Z01818 Encounter for other preprocedural examination: Secondary | ICD-10-CM | POA: Diagnosis not present

## 2020-02-27 DIAGNOSIS — Z20822 Contact with and (suspected) exposure to covid-19: Secondary | ICD-10-CM | POA: Diagnosis not present

## 2020-02-27 LAB — SARS CORONAVIRUS 2 (TAT 6-24 HRS): SARS Coronavirus 2: NEGATIVE

## 2020-02-28 NOTE — Progress Notes (Signed)
Instructed patient on the following items: Arrival time 0630 Nothing to eat or drink after midnight No meds AM of procedure Responsible person to drive you home and stay with you for 24 hrs  Have you missed any doses of anti-coagulant Xarelto- hasn't missed any doses    

## 2020-02-29 ENCOUNTER — Ambulatory Visit (HOSPITAL_COMMUNITY): Payer: Medicare HMO | Admitting: Anesthesiology

## 2020-02-29 ENCOUNTER — Other Ambulatory Visit: Payer: Self-pay

## 2020-02-29 ENCOUNTER — Encounter (HOSPITAL_COMMUNITY)
Admission: RE | Disposition: A | Discharge status: Home / Self Care | Payer: Self-pay | Source: Home / Self Care | Attending: Cardiology

## 2020-02-29 ENCOUNTER — Ambulatory Visit (HOSPITAL_COMMUNITY)
Admission: RE | Admit: 2020-02-29 | Discharge: 2020-02-29 | Disposition: A | Discharge status: Home / Self Care | Payer: Medicare HMO | Attending: Cardiology | Admitting: Cardiology

## 2020-02-29 ENCOUNTER — Encounter (HOSPITAL_COMMUNITY): Payer: Self-pay | Admitting: Cardiology

## 2020-02-29 DIAGNOSIS — E119 Type 2 diabetes mellitus without complications: Secondary | ICD-10-CM | POA: Diagnosis not present

## 2020-02-29 DIAGNOSIS — I10 Essential (primary) hypertension: Secondary | ICD-10-CM | POA: Insufficient documentation

## 2020-02-29 DIAGNOSIS — Z79899 Other long term (current) drug therapy: Secondary | ICD-10-CM | POA: Diagnosis not present

## 2020-02-29 DIAGNOSIS — E669 Obesity, unspecified: Secondary | ICD-10-CM | POA: Diagnosis not present

## 2020-02-29 DIAGNOSIS — Z7901 Long term (current) use of anticoagulants: Secondary | ICD-10-CM | POA: Diagnosis not present

## 2020-02-29 DIAGNOSIS — G4733 Obstructive sleep apnea (adult) (pediatric): Secondary | ICD-10-CM | POA: Insufficient documentation

## 2020-02-29 DIAGNOSIS — Z87891 Personal history of nicotine dependence: Secondary | ICD-10-CM | POA: Diagnosis not present

## 2020-02-29 DIAGNOSIS — Z7982 Long term (current) use of aspirin: Secondary | ICD-10-CM | POA: Insufficient documentation

## 2020-02-29 DIAGNOSIS — I4819 Other persistent atrial fibrillation: Secondary | ICD-10-CM

## 2020-02-29 DIAGNOSIS — Z6831 Body mass index (BMI) 31.0-31.9, adult: Secondary | ICD-10-CM | POA: Insufficient documentation

## 2020-02-29 DIAGNOSIS — G4730 Sleep apnea, unspecified: Secondary | ICD-10-CM | POA: Diagnosis not present

## 2020-02-29 DIAGNOSIS — E782 Mixed hyperlipidemia: Secondary | ICD-10-CM | POA: Diagnosis not present

## 2020-02-29 HISTORY — PX: ATRIAL FIBRILLATION ABLATION: EP1191

## 2020-02-29 LAB — CBC
HCT: 39.8 % (ref 39.0–52.0)
Hemoglobin: 12.7 g/dL — ABNORMAL LOW (ref 13.0–17.0)
MCH: 34.1 pg — ABNORMAL HIGH (ref 26.0–34.0)
MCHC: 31.9 g/dL (ref 30.0–36.0)
MCV: 107 fL — ABNORMAL HIGH (ref 80.0–100.0)
Platelets: 219 10*3/uL (ref 150–400)
RBC: 3.72 MIL/uL — ABNORMAL LOW (ref 4.22–5.81)
RDW: 14.5 % (ref 11.5–15.5)
WBC: 8 10*3/uL (ref 4.0–10.5)
nRBC: 0 % (ref 0.0–0.2)

## 2020-02-29 LAB — POCT ACTIVATED CLOTTING TIME
Activated Clotting Time: 230 seconds
Activated Clotting Time: 285 seconds
Activated Clotting Time: 301 seconds

## 2020-02-29 LAB — GLUCOSE, CAPILLARY
Glucose-Capillary: 92 mg/dL (ref 70–99)
Glucose-Capillary: 106 mg/dL — ABNORMAL HIGH (ref 70–99)

## 2020-02-29 SURGERY — ATRIAL FIBRILLATION ABLATION
Anesthesia: General

## 2020-02-29 MED ORDER — ONDANSETRON HCL 4 MG/2ML IJ SOLN: 4.0000 mg | Freq: Four times a day (QID) | INTRAMUSCULAR | Status: DC | PRN

## 2020-02-29 MED ORDER — DOBUTAMINE IN D5W 4-5 MG/ML-% IV SOLN
INTRAVENOUS | Status: AC
  Filled 2020-02-29: qty 250

## 2020-02-29 MED ORDER — DEXAMETHASONE SODIUM PHOSPHATE 10 MG/ML IJ SOLN
INTRAMUSCULAR | Status: DC | PRN
  Administered 2020-02-29: 10 mg via INTRAVENOUS

## 2020-02-29 MED ORDER — SODIUM CHLORIDE 0.9% FLUSH: 3.0000 mL | Freq: Two times a day (BID) | INTRAVENOUS | Status: DC

## 2020-02-29 MED ORDER — EPHEDRINE SULFATE-NACL 50-0.9 MG/10ML-% IV SOSY
PREFILLED_SYRINGE | INTRAVENOUS | Status: DC | PRN
  Administered 2020-02-29 (×2): 10 mg via INTRAVENOUS
  Administered 2020-02-29: 5 mg via INTRAVENOUS

## 2020-02-29 MED ORDER — HEPARIN SODIUM (PORCINE) 1000 UNIT/ML IJ SOLN
INTRAMUSCULAR | Status: AC
  Filled 2020-02-29: qty 1

## 2020-02-29 MED ORDER — LIDOCAINE 2% (20 MG/ML) 5 ML SYRINGE
INTRAMUSCULAR | Status: DC | PRN
  Administered 2020-02-29: 60 mg via INTRAVENOUS

## 2020-02-29 MED ORDER — FENTANYL CITRATE (PF) 100 MCG/2ML IJ SOLN
INTRAMUSCULAR | Status: DC | PRN
  Administered 2020-02-29 (×2): 50 ug via INTRAVENOUS

## 2020-02-29 MED ORDER — PROTAMINE SULFATE 10 MG/ML IV SOLN
INTRAVENOUS | Status: DC | PRN
  Administered 2020-02-29 (×4): 10 mg via INTRAVENOUS

## 2020-02-29 MED ORDER — DOBUTAMINE IN D5W 4-5 MG/ML-% IV SOLN
INTRAVENOUS | Status: DC | PRN
  Administered 2020-02-29: 20 ug/kg/min via INTRAVENOUS

## 2020-02-29 MED ORDER — EPHEDRINE SULFATE-NACL 50-0.9 MG/10ML-% IV SOSY: PREFILLED_SYRINGE | INTRAVENOUS | Status: DC | PRN

## 2020-02-29 MED ORDER — SUGAMMADEX SODIUM 200 MG/2ML IV SOLN
INTRAVENOUS | Status: DC | PRN
  Administered 2020-02-29 (×2): 100 mg via INTRAVENOUS

## 2020-02-29 MED ORDER — HEPARIN (PORCINE) IN NACL 1000-0.9 UT/500ML-% IV SOLN
INTRAVENOUS | Status: DC | PRN
  Administered 2020-02-29 (×5): 500 mL

## 2020-02-29 MED ORDER — HEPARIN (PORCINE) IN NACL 1000-0.9 UT/500ML-% IV SOLN
INTRAVENOUS | Status: AC
  Filled 2020-02-29: qty 500

## 2020-02-29 MED ORDER — SODIUM CHLORIDE 0.9% FLUSH: 3.0000 mL | INTRAVENOUS | Status: DC | PRN

## 2020-02-29 MED ORDER — ONDANSETRON HCL 4 MG/2ML IJ SOLN
INTRAMUSCULAR | Status: DC | PRN
  Administered 2020-02-29: 4 mg via INTRAVENOUS

## 2020-02-29 MED ORDER — HEPARIN SODIUM (PORCINE) 1000 UNIT/ML IJ SOLN
INTRAMUSCULAR | Status: DC | PRN
  Administered 2020-02-29: 15000 [IU] via INTRAVENOUS
  Administered 2020-02-29: 4000 [IU] via INTRAVENOUS
  Administered 2020-02-29: 8000 [IU] via INTRAVENOUS

## 2020-02-29 MED ORDER — SODIUM CHLORIDE 0.9 % IV SOLN: INTRAVENOUS | Status: DC

## 2020-02-29 MED ORDER — PROPOFOL 10 MG/ML IV BOLUS
INTRAVENOUS | Status: DC | PRN
  Administered 2020-02-29: 20 mg via INTRAVENOUS
  Administered 2020-02-29: 150 mg via INTRAVENOUS

## 2020-02-29 MED ORDER — APIXABAN 5 MG PO TABS: 5.0000 mg | ORAL_TABLET | Freq: Two times a day (BID) | ORAL | Status: DC

## 2020-02-29 MED ORDER — PHENYLEPHRINE 40 MCG/ML (10ML) SYRINGE FOR IV PUSH (FOR BLOOD PRESSURE SUPPORT)
PREFILLED_SYRINGE | INTRAVENOUS | Status: DC | PRN
  Administered 2020-02-29 (×2): 80 ug via INTRAVENOUS

## 2020-02-29 MED ORDER — ROCURONIUM BROMIDE 10 MG/ML (PF) SYRINGE
PREFILLED_SYRINGE | INTRAVENOUS | Status: DC | PRN
  Administered 2020-02-29: 100 mg via INTRAVENOUS

## 2020-02-29 MED ORDER — APIXABAN 5 MG PO TABS
5.0000 mg | ORAL_TABLET | Freq: Once | ORAL | Status: AC
  Administered 2020-02-29: 5 mg via ORAL
  Filled 2020-02-29: qty 1

## 2020-02-29 MED ORDER — ACETAMINOPHEN 325 MG PO TABS: 650.0000 mg | ORAL_TABLET | ORAL | Status: DC | PRN

## 2020-02-29 MED ORDER — SODIUM CHLORIDE 0.9 % IV SOLN: 250.0000 mL | INTRAVENOUS | Status: DC | PRN

## 2020-02-29 MED ORDER — HEPARIN SODIUM (PORCINE) 1000 UNIT/ML IJ SOLN
INTRAMUSCULAR | Status: DC | PRN
  Administered 2020-02-29: 1000 [IU] via INTRAVENOUS

## 2020-02-29 SURGICAL SUPPLY — 21 items
BAG SNAP BAND KOVER 36X36 (MISCELLANEOUS) ×2 IMPLANT
BLANKET WARM UNDERBOD FULL ACC (MISCELLANEOUS) ×2 IMPLANT
CATH 8FR REPROCESSED SOUNDSTAR (CATHETERS) ×2 IMPLANT
CATH MAPPNG PENTARAY F 2-6-2MM (CATHETERS) ×1 IMPLANT
CATH S CIRCA THERM PROBE 10F (CATHETERS) ×2 IMPLANT
CATH SMTCH THERMOCOOL SF DF (CATHETERS) ×2 IMPLANT
CATH WEBSTER BI DIR CS D-F CRV (CATHETERS) ×2 IMPLANT
CLOSURE PERCLOSE PROSTYLE (VASCULAR PRODUCTS) ×8 IMPLANT
COVER SWIFTLINK CONNECTOR (BAG) ×2 IMPLANT
KIT VERSACROSS STEERABLE D1 (CATHETERS) ×2 IMPLANT
PACK EP LATEX FREE (CUSTOM PROCEDURE TRAY) ×2
PACK EP LF (CUSTOM PROCEDURE TRAY) ×1 IMPLANT
PAD PRO RADIOLUCENT 2001M-C (PAD) ×2 IMPLANT
PATCH CARTO3 (PAD) ×2 IMPLANT
PENTARAY F 2-6-2MM (CATHETERS) ×2
SHEATH CARTO VIZIGO SM CVD (SHEATH) ×2 IMPLANT
SHEATH PINNACLE 7F 10CM (SHEATH) ×2 IMPLANT
SHEATH PINNACLE 8F 10CM (SHEATH) ×4 IMPLANT
SHEATH PINNACLE 9F 10CM (SHEATH) ×2 IMPLANT
SHEATH PROBE COVER 6X72 (BAG) ×2 IMPLANT
TUBING SMART ABLATE COOLFLOW (TUBING) ×2 IMPLANT

## 2020-02-29 NOTE — Transfer of Care (Signed)
Immediate Anesthesia Transfer of Care Note  Patient: Logan [name]  Procedure(s) Performed: ATRIAL FIBRILLATION ABLATION (N/A )  Patient Location: PACU  Anesthesia Type:General  Level of Consciousness: awake, oriented and patient cooperative  Airway & Oxygen Therapy: Patient Spontanous Breathing and Patient connected to nasal cannula oxygen  Post-op Assessment: Report given to RN and Post -op Vital signs reviewed and stable  Post vital signs: Reviewed  Last Vitals:  Vitals Value Taken Time  BP 139/51 02/29/20 1120  Temp    Pulse 76 02/29/20 1122  Resp 22 02/29/20 1122  SpO2 96 % 02/29/20 1122  Vitals shown include unvalidated device data.  Last Pain:  Vitals:   02/29/20 0714  TempSrc:   PainSc: 0-No pain         Complications: No complications documented.

## 2020-02-29 NOTE — Anesthesia Postprocedure Evaluation (Signed)
Anesthesia Post Note  Patient: Logan [name]  Procedure(s) Performed: ATRIAL FIBRILLATION ABLATION (N/A )     Patient location during evaluation: PACU Anesthesia Type: General Level of consciousness: awake and alert, oriented and patient cooperative Pain management: pain level controlled Vital Signs Assessment: post-procedure vital signs reviewed and stable Respiratory status: spontaneous breathing, nonlabored ventilation and respiratory function stable Cardiovascular status: blood pressure returned to baseline and stable Postop Assessment: no apparent nausea or vomiting Anesthetic complications: no   No complications documented.  Last Vitals:  Vitals:   02/29/20 1200 02/29/20 1212  BP: (!) 138/54 (!) 149/64  Pulse: 77 77  Resp: 17 16  Temp:    SpO2: 91% 94%    Last Pain:  Vitals:   02/29/20 1155  TempSrc: Temporal  PainSc: 0-No pain                 Pervis Hocking

## 2020-02-29 NOTE — Progress Notes (Signed)
Pt was putting his hospital socks on and started yelling for the nurse.  Upon entering room, I noticed a puddle of blood at pt's (R) foot.  Hospital sock was saturated in blood. Foot elevated and sock removed. Pt had pulsating blood from his Posterior Tibial artery.  Pressure held and bleeding controlled quickly.  Pt states he got spurred by a roster a week ago and he had to call 911. States he has not had any bleeding since and he thinks he just knocked the scab off.  Gwynne Edinger and Dr Curt Bears notified.  CBC obtained stat.

## 2020-02-29 NOTE — Discharge Instructions (Signed)
Post procedure care instructions No driving for 4 days. No lifting over 5 lbs for 1 week. No vigorous or sexual activity for 1 week. You may return to work/your usual activities on 03/07/2020. Keep procedure site clean & dry. If you notice increased pain, swelling, bleeding or pus, call/return!  You may shower, but no soaking baths/hot tubs/pools for 1 week.    You have an appointment set up with the [facility].  Multiple studies have shown that being followed by a dedicated atrial fibrillation clinic in addition to the standard care you receive from your other physicians improves health. We believe that enrollment in the atrial fibrillation clinic will allow [place] to better care for you.   The phone number to the [facility] is [phone]. The clinic is staffed Monday through Friday from 8:30am to 5pm.  Parking Directions: The clinic is located in the Heart and Vascular Building connected to [facility]. 1)From [address] turn on to [address] and go to the 3rd entrance  (Heart and Vascular entrance) on the right. 2)Look to the right for Heart &Vascular Parking Garage.   Cardiac Ablation, Care After  This sheet gives you information about how to care for yourself after your procedure. Your health care provider may also give you more specific instructions. If you have problems or questions, contact your health care provider. What can I expect after the procedure? After the procedure, it is common to have:  Bruising around your puncture site.  Tenderness around your puncture site.  Skipped heartbeats.  Tiredness (fatigue).  Follow these instructions at home: Puncture site care   Follow instructions from your health care provider about how to take care of your puncture site. Make sure you: ? If present, leave stitches (sutures), skin glue, or adhesive strips in place. These skin closures may need to stay in place for up to 2 weeks. If  adhesive strip edges start to loosen and curl up, you may trim the loose edges. Do not remove adhesive strips completely unless your health care provider tells you to do that. ? If a large square bandage is present, this may be removed 24 hours after surgery.   Check your puncture site every day for signs of infection. Check for: ? Redness, swelling, or pain. ? Fluid or blood. If your puncture site starts to bleed, lie down on your back, apply firm pressure to the area, and contact your health care provider. ? Warmth. ? Pus or a bad smell. Driving  Do not drive for at least 4 days after your procedure or however long your health care provider recommends. (Do not resume driving if you have previously been instructed not to drive for other health reasons.)  Do not drive or use heavy machinery while taking prescription pain medicine. Activity  Avoid activities that take a lot of effort for at least 7 days after your procedure.  Do not lift anything that is heavier than 5 lb (4.5 kg) for one week.   No sexual activity for 1 week.   Return to your normal activities as told by your health care provider. Ask your health care provider what activities are safe for you. General instructions  Take over-the-counter and prescription medicines only as told by your health care provider.  Do not use any products that contain nicotine or tobacco, such as cigarettes and e-cigarettes. If you need help quitting, ask your health care provider.  You may shower after 24 hours, but Do not  take baths, swim, or use a hot tub for 1 week.   Do not drink alcohol for 24 hours after your procedure.  Keep all follow-up visits as told by your health care provider. This is important. Contact a health care provider if:  You have redness, mild swelling, or pain around your puncture site.  You have fluid or blood coming from your puncture site that stops after applying firm pressure to the area.  Your puncture  site feels warm to the touch.  You have pus or a bad smell coming from your puncture site.  You have a fever.  You have chest pain or discomfort that spreads to your neck, jaw, or arm.  You are sweating a lot.  You feel nauseous.  You have a fast or irregular heartbeat.  You have shortness of breath.  You are dizzy or light-headed and feel the need to lie down.  You have pain or numbness in the arm or leg closest to your puncture site. Get help right away if:  Your puncture site suddenly swells.  Your puncture site is bleeding and the bleeding does not stop after applying firm pressure to the area. These symptoms may represent a serious problem that is an emergency. Do not wait to see if the symptoms will go away. Get medical help right away. Call your local emergency services (911 in the [place] Do not drive yourself to the hospital. Summary  After the procedure, it is normal to have bruising and tenderness at the puncture site in your groin, neck, or forearm.  Check your puncture site every day for signs of infection.  Get help right away if your puncture site is bleeding and the bleeding does not stop after applying firm pressure to the area. This is a medical emergency. This information is not intended to replace advice given to you by your health care provider. Make sure you discuss any questions you have with your health care provider.   3)A code for the entrance is required for December is 3007   4)Take the elevators to the 1st floor. Registration is in the room with the glass walls at the end of the hallway.  If you have any trouble parking or locating the clinic, please dont hesitate to call [phone].

## 2020-02-29 NOTE — Anesthesia Preprocedure Evaluation (Addendum)
Anesthesia Evaluation  Patient identified by MRN, date of birth, ID band Patient awake    Reviewed: Allergy & Precautions, NPO status , Patient's Chart, lab work & pertinent test results, reviewed documented beta blocker date and time   Airway Mallampati: III  TM Distance: >3 FB Neck ROM: Full    Dental  (+) Dental Advisory Given, Edentulous Upper, Edentulous Lower   Pulmonary sleep apnea and Continuous Positive Airway Pressure Ventilation , former smoker,  Quit smoking 1998   Pulmonary exam normal breath sounds clear to auscultation       Cardiovascular hypertension, Pt. on medications and Pt. on home beta blockers + Peripheral Vascular Disease, +CHF (diastolic dysfunction) and + DVT  Normal cardiovascular exam+ dysrhythmias (amiodarone, coreg) Atrial Fibrillation + Valvular Problems/Murmurs (mild AI) AI  Rhythm:Regular Rate:Normal  Echo 08/2019 1. Left ventricular ejection fraction, by estimation, is 55 to 60%. The  left ventricle has normal function. The left ventricle has no regional  wall motion abnormalities. There is mild concentric left ventricular  hypertrophy. Left ventricular diastolic  parameters are indeterminate.  2. Right ventricular systolic function is mildly reduced. The right  ventricular size is mildly enlarged. Mildly increased right ventricular  wall thickness. There is normal pulmonary artery systolic pressure.  3. Right atrial size was severely dilated.  4. The mitral valve is normal in structure. No evidence of mitral valve  regurgitation. No evidence of mitral stenosis.  5. The aortic valve is tricuspid. Aortic valve regurgitation is mild. No  aortic stenosis is present.  6. The inferior vena cava is dilated in size with >50% respiratory  variability, suggesting right atrial pressure of 8 mmHg.    Neuro/Psych PSYCHIATRIC DISORDERS Anxiety negative neurological ROS     GI/Hepatic Neg liver ROS,  GERD  Medicated and Controlled,  Endo/Other  diabetes, Well ControlledBMI 31 Diet controlled T2DM  Renal/GU Renal InsufficiencyRenal diseaseCr 1.52  negative genitourinary   Musculoskeletal  (+) Arthritis , Osteoarthritis,    Abdominal (+) + obese,   Peds  Hematology negative hematology ROS (+)   Anesthesia Other Findings   Reproductive/Obstetrics negative OB ROS                            Anesthesia Physical Anesthesia Plan  ASA: III  Anesthesia Plan: General   Post-op Pain Management:    Induction: Intravenous  PONV Risk Score and Plan: 2 and Ondansetron, Dexamethasone, Midazolam and Treatment may vary due to age or medical condition  Airway Management Planned: Oral ETT and Video Laryngoscope Planned  Additional Equipment: None  Intra-op Plan:   Post-operative Plan: Extubation in OR  Informed Consent: I have reviewed the patients History and Physical, chart, labs and discussed the procedure including the risks, benefits and alternatives for the proposed anesthesia with the patient or authorized representative who has indicated his/her understanding and acceptance.     Dental advisory given  Plan Discussed with: CRNA  Anesthesia Plan Comments:        Anesthesia Quick Evaluation

## 2020-02-29 NOTE — Interval H&P Note (Signed)
History and Physical Interval Note:  02/29/2020 8:04 AM  Logan [name]  has presented today for surgery, with the diagnosis of AFIB.  The various methods of treatment have been discussed with the patient and family. After consideration of risks, benefits and other options for treatment, the patient has consented to  Procedure(s): ATRIAL FIBRILLATION ABLATION (N/A) as a surgical intervention.  The patient's history has been reviewed, patient examined, no change in status, stable for surgery.  I have reviewed the patient's chart and labs.  Questions were answered to the patient's satisfaction.     Logan [name]

## 2020-02-29 NOTE — Anesthesia Procedure Notes (Signed)
Procedure Name: Intubation Date/Time: 02/29/2020 8:38 AM Performed by: Jenne Campus, CRNA Pre-anesthesia Checklist: Patient identified, Emergency Drugs available, Suction available and Patient being monitored Patient Re-evaluated:Patient Re-evaluated prior to induction Oxygen Delivery Method: Circle System Utilized Preoxygenation: Pre-oxygenation with 100% oxygen Induction Type: IV induction Ventilation: Mask ventilation without difficulty and Two handed mask ventilation required Laryngoscope Size: Glidescope and 4 Grade View: Grade I Tube type: Oral Tube size: 7.5 mm Number of attempts: 1 Airway Equipment and Method: Stylet and Video-laryngoscopy Placement Confirmation: ETT inserted through vocal cords under direct vision,  positive ETCO2 and breath sounds checked- equal and bilateral Secured at: 22 cm Tube secured with: Tape Dental Injury: Teeth and Oropharynx as per pre-operative assessment  Difficulty Due To: Difficult Airway- due to large tongue Comments: Elective video-glide.

## 2020-03-01 ENCOUNTER — Encounter (HOSPITAL_COMMUNITY): Payer: Self-pay | Admitting: Cardiology

## 2020-03-28 ENCOUNTER — Other Ambulatory Visit: Payer: Self-pay

## 2020-03-28 ENCOUNTER — Ambulatory Visit (HOSPITAL_COMMUNITY)
Admission: RE | Admit: 2020-03-28 | Discharge: 2020-03-28 | Disposition: A | Discharge status: Home / Self Care | Payer: Medicare HMO | Source: Ambulatory Visit | Attending: Physician Assistant | Admitting: Physician Assistant

## 2020-03-28 ENCOUNTER — Encounter (HOSPITAL_COMMUNITY): Payer: Self-pay | Admitting: Physician Assistant

## 2020-03-28 VITALS — BP 134/78 | HR 59 | Ht 68.0 in | Wt 223.4 lb

## 2020-03-28 DIAGNOSIS — Z7901 Long term (current) use of anticoagulants: Secondary | ICD-10-CM | POA: Insufficient documentation

## 2020-03-28 DIAGNOSIS — I4819 Other persistent atrial fibrillation: Secondary | ICD-10-CM

## 2020-03-28 DIAGNOSIS — D6869 Other thrombophilia: Secondary | ICD-10-CM | POA: Diagnosis not present

## 2020-03-28 DIAGNOSIS — Z79899 Other long term (current) drug therapy: Secondary | ICD-10-CM | POA: Diagnosis not present

## 2020-03-28 DIAGNOSIS — E669 Obesity, unspecified: Secondary | ICD-10-CM | POA: Diagnosis not present

## 2020-03-28 DIAGNOSIS — I10 Essential (primary) hypertension: Secondary | ICD-10-CM | POA: Insufficient documentation

## 2020-03-28 DIAGNOSIS — E785 Hyperlipidemia, unspecified: Secondary | ICD-10-CM | POA: Insufficient documentation

## 2020-03-28 DIAGNOSIS — Z7982 Long term (current) use of aspirin: Secondary | ICD-10-CM | POA: Insufficient documentation

## 2020-03-28 DIAGNOSIS — Z6833 Body mass index (BMI) 33.0-33.9, adult: Secondary | ICD-10-CM | POA: Diagnosis not present

## 2020-03-28 DIAGNOSIS — G4733 Obstructive sleep apnea (adult) (pediatric): Secondary | ICD-10-CM | POA: Insufficient documentation

## 2020-03-28 HISTORY — DX: Other thrombophilia: D68.69

## 2020-03-28 MED ORDER — AMIODARONE HCL 200 MG PO TABS: 200.0000 mg | ORAL_TABLET | Freq: Every day | ORAL | 3 refills | Status: DC

## 2020-03-28 NOTE — Patient Instructions (Signed)
Decrease Amiodarone to 200mg once a day 

## 2020-03-28 NOTE — Progress Notes (Signed)
Primary Care Physician: Logan Kiel, MD Primary Cardiologist: Dr Logan [name] Primary Electrophysiologist: Dr Logan [name] Referring Physician: Dr Logan [name] is a 74 y.o. male with a history of HTN, HLD, OSA, atrial fibrillation who presents for follow up in the [facility]. Patient underwent unsuccessful DCCV 10/2019 at [facility]. He was loaded on amiodarone and underwent afib ablation with Dr Logan [name] on 02/29/20. Patient is on Xarelto for a CHADS2VASC score of 2. Patient reports he feels well since his procedure with less fatigue or dyspnea with exertion. He denies CP, swallowing, or groin issues.   Today, he denies symptoms of palpitations, chest pain, shortness of breath, orthopnea, PND, lower extremity edema, dizziness, presyncope, syncope, snoring, daytime somnolence, bleeding, or neurologic sequela. The patient is tolerating medications without difficulties and is otherwise without complaint today.    Atrial Fibrillation Risk Factors:  he does have symptoms or diagnosis of sleep apnea. he is compliant with CPAP therapy. he does not have a history of rheumatic fever.   he has a BMI of Body mass index is 33.97 kg/m.Marland Kitchen Filed Weights   03/28/20 1141  Weight: 101.3 kg    Family History  Problem Relation Age of Onset  . Heart disease Mother   . Cancer Father        throat cancer     Atrial Fibrillation Management history:  Previous antiarrhythmic drugs: amiodarone  Previous cardioversions: 10/2019 Previous ablations: 02/29/20 CHADS2VASC score: 2 Anticoagulation history: Xarelto   Past Medical History:  Diagnosis Date  . Anxiety   . Atherosclerotic renal artery stenosis, unilateral ([place]) 02/28/2014  . Atrial fibrillation ([place])   . Bilateral lower extremity edema   . Chronic diastolic heart failure ([place]) 10/21/2019  . Diabetes mellitus without complication ([place])   . DVT (deep venous thrombosis) ([place])   . Esophageal reflux   .  Essential hypertension 10/21/2019  . Gout   . Hyperlipidemia   . Hypertension   . Mixed hyperlipidemia 10/21/2019  . Morbid obesity ([place])   . Obesity (BMI 30-39.9) 08/24/2019  . Orthopnea   . Osteoarthritis   . Persistent atrial fibrillation ([place]) 10/21/2019  . Seasonal allergies   . Sleep apnea    Past Surgical History:  Procedure Laterality Date  . ABDOMINAL AORTAGRAM N/A 02/28/2014   Procedure: ABDOMINAL AORTAGRAM;  Surgeon: Logan Mitchell, MD;  Location: [facility] CATH LAB;  Service: Cardiovascular;  Laterality: N/A;  . APPENDECTOMY    . ATRIAL FIBRILLATION ABLATION N/A 02/29/2020   Procedure: ATRIAL FIBRILLATION ABLATION;  Surgeon: Logan Haw, MD;  Location: [facility] CV LAB;  Service: Cardiovascular;  Laterality: N/A;  . CARPAL TUNNEL RELEASE Left     Current Outpatient Medications  Medication Sig Dispense Refill  . allopurinol (ZYLOPRIM) 300 MG tablet Take 300 mg by mouth daily.    Marland Kitchen amiodarone (PACERONE) 400 MG tablet Take 1 tablet (400 mg total) by mouth daily. Take 1 tablet by mouth twice a day for 2 weeks, then decrease to 1 tablet daily (Patient taking differently: Take 400 mg by mouth daily. ) 180 tablet 3  . aspirin EC 81 MG tablet Take 81 mg by mouth every Monday, Wednesday, and Friday.     . carvedilol (COREG) 12.5 MG tablet Take 12.5 mg by mouth 2 (two) times daily with a meal.    . Cholecalciferol (VITAMIN D-3) 1000 UNITS CAPS Take 1,000 Units by mouth daily.    Marland Kitchen DHEA 25 MG CAPS Take 25 mg by mouth daily.    Marland Kitchen  doxazosin (CARDURA) 8 MG tablet Take 8 mg by mouth daily.     . fluticasone (FLONASE) 50 MCG/ACT nasal spray Place 1 spray into both nostrils daily as needed for rhinitis.     . furosemide (LASIX) 40 MG tablet Take 1 tablet (40 mg total) by mouth every other day. (Patient taking differently: Take 40 mg by mouth daily. ) 90 tablet 3  . hydrALAZINE (APRESOLINE) 25 MG tablet Take 25 mg by mouth in the morning and at bedtime.     Marland Kitchen ketoconazole (NIZORAL) 2 %  cream Apply 1 application topically daily as needed for irritation.     Marland Kitchen loratadine (CLARITIN) 10 MG tablet Take 10 mg by mouth daily as needed for allergies.    Marland Kitchen losartan (COZAAR) 100 MG tablet Take 100 mg by mouth daily.    . meloxicam (MOBIC) 7.5 MG tablet Take 7.5 mg by mouth 2 (two) times daily as needed.     . nystatin-triamcinolone (MYCOLOG II) cream Apply 1 application topically daily as needed (Rash).     Marland Kitchen omeprazole (PRILOSEC) 20 MG capsule Take 20 mg by mouth daily as needed (for heartburn).     . potassium chloride SA (KLOR-CON) 20 MEQ tablet Take 1 tablet (20 mEq total) by mouth daily. 90 tablet 1  . pravastatin (PRAVACHOL) 20 MG tablet Take 20 mg by mouth daily.     . rivaroxaban (XARELTO) 20 MG TABS tablet Take 1 tablet (20 mg total) by mouth daily with supper. 30 tablet 6  . traMADol (ULTRAM) 50 MG tablet Take 50 mg by mouth daily as needed for moderate pain.     . vitamin B-12 (CYANOCOBALAMIN) 1000 MCG tablet Take 1,000 mcg by mouth daily.     No current facility-administered medications for this encounter.    No Known Allergies  Social History   Socioeconomic History  . Marital status: Widowed    Spouse name: Not on file  . Number of children: Not on file  . Years of education: Not on file  . Highest education level: Not on file  Occupational History  . Not on file  Tobacco Use  . Smoking status: Former Smoker    Quit date: 02/07/1997    Years since quitting: 23.1  . Smokeless tobacco: Current User    Types: Chew  Substance and Sexual Activity  . Alcohol use: Never  . Drug use: Never  . Sexual activity: Not on file  Other Topics Concern  . Not on file  Social History Narrative  . Not on file   Social Determinants of Health   Financial Resource Strain:   . Difficulty of Paying Living Expenses: Not on file  Food Insecurity:   . Worried About Charity fundraiser in the Last Year: Not on file  . Ran Out of Food in the Last Year: Not on file   Transportation Needs:   . Lack of Transportation (Medical): Not on file  . Lack of Transportation (Non-Medical): Not on file  Physical Activity:   . Days of Exercise per Week: Not on file  . Minutes of Exercise per Session: Not on file  Stress:   . Feeling of Stress : Not on file  Social Connections:   . Frequency of Communication with Friends and Family: Not on file  . Frequency of Social Gatherings with Friends and Family: Not on file  . Attends Religious Services: Not on file  . Active Member of Clubs or Organizations: Not on file  . Attends  Club or Organization Meetings: Not on file  . Marital Status: Not on file  Intimate Partner Violence:   . Fear of Current or Ex-Partner: Not on file  . Emotionally Abused: Not on file  . Physically Abused: Not on file  . Sexually Abused: Not on file     ROS- All systems are reviewed and negative except as per the HPI above.  Physical Exam: Vitals:   03/28/20 1141  BP: 134/78  Pulse: (!) 59  Weight: 101.3 kg  Height: 5\' 8"  (1.727 m)    GEN- The patient is well appearing obese male, alert and oriented x 3 today.   Head- normocephalic, atraumatic Eyes-  Sclera clear, conjunctiva pink Ears- hearing intact Oropharynx- clear Neck- supple  Lungs- Clear to ausculation bilaterally, normal work of breathing Heart- Regular rate and rhythm, no murmurs, rubs or gallops  GI- soft, NT, ND, + BS Extremities- no clubbing, cyanosis, or edema MS- no significant deformity or atrophy Skin- no rash or lesion Psych- euthymic mood, full affect Neuro- strength and sensation are intact  Wt Readings from Last 3 Encounters:  03/28/20 101.3 kg  02/29/20 93 kg  02/16/20 100.6 kg    EKG today demonstrates SB HR 59, 1st degree AV block PR 226, QRS 70, QTc 423  Echo 09/13/19 demonstrated  1. Left ventricular ejection fraction, by estimation, is 55 to 60%. The  left ventricle has normal function. The left ventricle has no regional  wall motion  abnormalities. There is mild concentric left ventricular  hypertrophy. Left ventricular diastolic  parameters are indeterminate.  2. Right ventricular systolic function is mildly reduced. The right  ventricular size is mildly enlarged. Mildly increased right ventricular  wall thickness. There is normal pulmonary artery systolic pressure.  3. Right atrial size was severely dilated.  4. The mitral valve is normal in structure. No evidence of mitral valve  regurgitation. No evidence of mitral stenosis.  5. The aortic valve is tricuspid. Aortic valve regurgitation is mild. No  aortic stenosis is present.  6. The inferior vena cava is dilated in size with >50% respiratory  variability, suggesting right atrial pressure of 8 mmHg.   Epic records are reviewed at length today  CHA2DS2-VASc Score = 2  The patient's score is based upon: CHF History: 0 HTN History: 1 Diabetes History: 0 Stroke History: 0 Vascular Disease History: 0 Age Score: 1 Gender Score: 0      ASSESSMENT AND PLAN: 1. Persistent Atrial Fibrillation (ICD10:  I48.19) The patient's CHA2DS2-VASc score is 2, indicating a 2.2% annual risk of stroke.   S/p afib ablation 02/29/20 Patient appears to be maintaining SR. Decrease amiodarone to 200 mg daily. Continue Coreg 12.5 mg BID Continue Xarelto 20 mg daily  2. Secondary Hypercoagulable State (ICD10:  D68.69) The patient is at significant risk for stroke/thromboembolism based upon his CHA2DS2-VASc Score of 2.  Continue Rivaroxaban (Xarelto).   3. Obesity Body mass index is 33.97 kg/m. Lifestyle modification was discussed at length including regular exercise and weight reduction.  4. Obstructive sleep apnea The importance of adequate treatment of sleep apnea was discussed today in order to improve our ability to maintain sinus rhythm long term. Encouraged compliance with CPAP therapy.  5. HTN Stable, no changes today.   Follow up with Dr Logan [name] and Dr Logan [name]  as scheduled.    [facility] [address] [place], [place] [address] [phone] 03/28/2020 11:46 AM

## 2020-03-29 DIAGNOSIS — I10 Essential (primary) hypertension: Secondary | ICD-10-CM | POA: Diagnosis not present

## 2020-03-29 DIAGNOSIS — D6869 Other thrombophilia: Secondary | ICD-10-CM | POA: Diagnosis not present

## 2020-03-29 DIAGNOSIS — M25519 Pain in unspecified shoulder: Secondary | ICD-10-CM | POA: Diagnosis not present

## 2020-03-29 DIAGNOSIS — Z79899 Other long term (current) drug therapy: Secondary | ICD-10-CM | POA: Diagnosis not present

## 2020-03-29 DIAGNOSIS — I4891 Unspecified atrial fibrillation: Secondary | ICD-10-CM | POA: Diagnosis not present

## 2020-03-29 DIAGNOSIS — E1169 Type 2 diabetes mellitus with other specified complication: Secondary | ICD-10-CM | POA: Diagnosis not present

## 2020-03-29 DIAGNOSIS — E785 Hyperlipidemia, unspecified: Secondary | ICD-10-CM | POA: Diagnosis not present

## 2020-04-02 DIAGNOSIS — M25512 Pain in left shoulder: Secondary | ICD-10-CM | POA: Diagnosis not present

## 2020-04-02 DIAGNOSIS — M25511 Pain in right shoulder: Secondary | ICD-10-CM | POA: Diagnosis not present

## 2020-04-24 ENCOUNTER — Other Ambulatory Visit: Payer: Self-pay | Admitting: Cardiology

## 2020-04-24 NOTE — Telephone Encounter (Signed)
Prescription refill request for Xarelto received.  Indication:atrial fibrillation Last office visit:10/21 tobb Weight:101.3 kg Age:75 Scr:1.52 10/21 CrCl: 63.78 ml/min  Prescription refilled

## 2020-05-16 DIAGNOSIS — M47812 Spondylosis without myelopathy or radiculopathy, cervical region: Secondary | ICD-10-CM | POA: Diagnosis not present

## 2020-05-18 ENCOUNTER — Other Ambulatory Visit: Payer: Self-pay

## 2020-05-18 DIAGNOSIS — J302 Other seasonal allergic rhinitis: Secondary | ICD-10-CM | POA: Insufficient documentation

## 2020-05-18 DIAGNOSIS — R600 Localized edema: Secondary | ICD-10-CM | POA: Insufficient documentation

## 2020-05-18 DIAGNOSIS — I82409 Acute embolism and thrombosis of unspecified deep veins of unspecified lower extremity: Secondary | ICD-10-CM | POA: Insufficient documentation

## 2020-05-18 DIAGNOSIS — R0601 Orthopnea: Secondary | ICD-10-CM | POA: Insufficient documentation

## 2020-05-21 ENCOUNTER — Other Ambulatory Visit: Payer: Self-pay

## 2020-05-21 ENCOUNTER — Ambulatory Visit: Payer: Medicare HMO | Admitting: Cardiology

## 2020-05-21 ENCOUNTER — Encounter: Payer: Self-pay | Admitting: Cardiology

## 2020-05-21 VITALS — BP 142/72 | HR 62 | Ht 67.0 in | Wt 214.8 lb

## 2020-05-21 DIAGNOSIS — E119 Type 2 diabetes mellitus without complications: Secondary | ICD-10-CM

## 2020-05-21 DIAGNOSIS — I5032 Chronic diastolic (congestive) heart failure: Secondary | ICD-10-CM

## 2020-05-21 DIAGNOSIS — I480 Paroxysmal atrial fibrillation: Secondary | ICD-10-CM | POA: Diagnosis not present

## 2020-05-21 DIAGNOSIS — E8881 Metabolic syndrome: Secondary | ICD-10-CM

## 2020-05-21 DIAGNOSIS — I10 Essential (primary) hypertension: Secondary | ICD-10-CM

## 2020-05-21 DIAGNOSIS — E782 Mixed hyperlipidemia: Secondary | ICD-10-CM

## 2020-05-21 DIAGNOSIS — E559 Vitamin D deficiency, unspecified: Secondary | ICD-10-CM | POA: Diagnosis not present

## 2020-05-21 MED ORDER — FUROSEMIDE 40 MG PO TABS: 40.0000 mg | ORAL_TABLET | Freq: Every day | ORAL | Status: DC

## 2020-05-21 MED ORDER — AMIODARONE HCL 200 MG PO TABS: 100.0000 mg | ORAL_TABLET | Freq: Every day | ORAL | 3 refills | Status: DC

## 2020-05-21 NOTE — Patient Instructions (Signed)
Medication Instructions:  Your physician has recommended you make the following change in your medication:  DECREASE: Amiodarone 100 mg. Take half a tablet daily START: Lasix 40 mg daily  *If you need a refill on your cardiac medications before your next appointment, please call your pharmacy*   Lab Work: Your physician recommends that you return for lab work: TODAY BMP, MAG, CBC, TSH  If you have labs (blood work) drawn today and your tests are completely normal, you will receive your results only by: Marland Kitchen MyChart Message (if you have MyChart) OR . A paper copy in the mail If you have any lab test that is abnormal or we need to change your treatment, we will call you to review the results.   Testing/Procedures: None   Follow-Up: At [facility], you and your health needs are our priority.  As part of our continuing mission to provide you with exceptional heart care, we have created designated Provider Care Teams.  These Care Teams include your primary Cardiologist (physician) and Advanced Practice Providers (APPs -  Physician Assistants and Nurse Practitioners) who all work together to provide you with the care you need, when you need it.  We recommend signing up for the patient portal called "MyChart".  Sign up information is provided on this After Visit Summary.  MyChart is used to connect with patients for Virtual Visits (Telemedicine).  Patients are able to view lab/test results, encounter notes, upcoming appointments, etc.  Non-urgent messages can be sent to your provider as well.   To learn more about what you can do with MyChart, go to [email or web address].    Your next appointment:   4 week(s)  The format for your next appointment:   In Person  Provider:   Berniece Salines, DO   Other Instructions

## 2020-05-21 NOTE — Progress Notes (Signed)
Cardiology Office Note:    Date:  05/21/2020   ID:  Logan [name], DOB 1945/09/20, MRN [id number]  PCP:  [name], MD  Cardiologist:  Berniece Salines, DO  Electrophysiologist:  Constance Haw, MD   Referring MD: [name], MD     History of Present Illness:    [name] 75 y.o.malewith a hx of of atrial fibrillation on Xarelto and carvedilol, hypertension, hyperlipidemia, prediabetes and morbid obesity.  I saw the patient in  November 21, 2019 at that time he was status post cardioversion however he was shocked 3 times with 200 J but unfortunately patient did not convert into sinus rhythm. At his last visit I started the patient amiodarone hoping to get him converted to sinus rhythm. During that time he was planning colonoscopy I did asked the patient to please wait until after August 20 which should been a good 4 weeks after his procedure.   The patient was seen in the December 22, 2019 at that time he still was in atrial fibrillation despite amiodarone.  He underwent EP ablation in November 2021 and appears to be doing well he was seen in the EP clinic in December 20, at that time he appeared to be in sinus rhythm discontinue his anticoagulation his beta-blocker.  Patient here today for follow-up visit.  He tells me that palpitation has improved but he has been experiencing some fatigue.  He also said he has some difficulty swallowing recently but these have improved.  Past Medical History:  Diagnosis Date  . Anxiety   . Atherosclerotic renal artery stenosis, unilateral ([place]) 02/28/2014  . Atrial fibrillation ([place])   . Bilateral lower extremity edema   . Chronic diastolic heart failure ([place]) 10/21/2019  . Diabetes mellitus without complication ([place])   . DVT (deep venous thrombosis) ([place])   . Esophageal reflux   . Essential hypertension 10/21/2019  . Gout   . Hyperlipidemia   . Hypertension   . Mixed hyperlipidemia 10/21/2019  . Morbid obesity ([place])   .  Obesity (BMI 30-39.9) 08/24/2019  . Orthopnea   . Osteoarthritis   . Persistent atrial fibrillation ([place]) 10/21/2019  . Seasonal allergies   . Sleep apnea     Past Surgical History:  Procedure Laterality Date  . ABDOMINAL AORTAGRAM N/A 02/28/2014   Procedure: ABDOMINAL AORTAGRAM;  Surgeon: Serafina Mitchell, MD;  Location: [facility] CATH LAB;  Service: Cardiovascular;  Laterality: N/A;  . APPENDECTOMY    . ATRIAL FIBRILLATION ABLATION N/A 02/29/2020   Procedure: ATRIAL FIBRILLATION ABLATION;  Surgeon: Constance Haw, MD;  Location: [facility] LAB;  Service: Cardiovascular;  Laterality: N/A;  . CARPAL TUNNEL RELEASE Left     Current Medications: Current Meds  Medication Sig  . allopurinol (ZYLOPRIM) 300 MG tablet Take 300 mg by mouth daily.  Marland Kitchen aspirin EC 81 MG tablet Take 81 mg by mouth every Monday, Wednesday, and Friday.   . carvedilol (COREG) 12.5 MG tablet Take 12.5 mg by mouth 2 (two) times daily with a meal.  . Cholecalciferol (VITAMIN D-3) 1000 UNITS CAPS Take 1,000 Units by mouth daily.  Marland Kitchen DHEA 25 MG CAPS Take 25 mg by mouth daily.  Marland Kitchen doxazosin (CARDURA) 8 MG tablet Take 8 mg by mouth daily.   . fluticasone (FLONASE) 50 MCG/ACT nasal spray Place 1 spray into both nostrils daily as needed for rhinitis.   . hydrALAZINE (APRESOLINE) 25 MG tablet Take 25 mg by mouth in the morning and at bedtime.   Marland Kitchen  ketoconazole (NIZORAL) 2 % cream Apply 1 application topically daily as needed for irritation.   Marland Kitchen loratadine (CLARITIN) 10 MG tablet Take 10 mg by mouth daily as needed for allergies.  Marland Kitchen losartan (COZAAR) 100 MG tablet Take 100 mg by mouth daily.  . meloxicam (MOBIC) 7.5 MG tablet Take 7.5 mg by mouth 2 (two) times daily as needed.   Marland Kitchen omeprazole (PRILOSEC) 20 MG capsule Take 20 mg by mouth daily as needed (for heartburn).   . potassium chloride SA (KLOR-CON) 20 MEQ tablet Take 1 tablet (20 mEq total) by mouth daily.  . pravastatin (PRAVACHOL) 20 MG tablet Take 20 mg by mouth daily.    . traMADol (ULTRAM) 50 MG tablet Take 50 mg by mouth daily as needed for moderate pain.   . vitamin B-12 (CYANOCOBALAMIN) 1000 MCG tablet Take 1,000 mcg by mouth daily.  Alveda Reasons 20 MG TABS tablet TAKE 1 TABLET BY MOUTH ONCE DAILY WITH SUPPER  . [DISCONTINUED] amiodarone (PACERONE) 200 MG tablet Take 1 tablet (200 mg total) by mouth daily.  . [DISCONTINUED] furosemide (LASIX) 40 MG tablet Take 1 tablet (40 mg total) by mouth every other day. (Patient taking differently: Take 40 mg by mouth daily.)  . [DISCONTINUED] nystatin-triamcinolone (MYCOLOG II) cream Apply 1 application topically daily as needed (Rash).      Allergies:   Patient has no known allergies.   Social History   Socioeconomic History  . Marital status: Widowed    Spouse name: Not on file  . Number of children: Not on file  . Years of education: Not on file  . Highest education level: Not on file  Occupational History  . Not on file  Tobacco Use  . Smoking status: Former Smoker    Quit date: 02/07/1997    Years since quitting: 23.2  . Smokeless tobacco: Current User    Types: Chew  Substance and Sexual Activity  . Alcohol use: Never  . Drug use: Never  . Sexual activity: Not on file  Other Topics Concern  . Not on file  Social History Narrative  . Not on file   Social Determinants of Health   Financial Resource Strain: Not on file  Food Insecurity: Not on file  Transportation Needs: Not on file  Physical Activity: Not on file  Stress: Not on file  Social Connections: Not on file     Family History: The patient's family history includes Cancer in his father; Heart disease in his mother.  ROS:   Review of Systems  Constitution: Negative for decreased appetite, fever and weight gain.  HENT: Negative for congestion, ear discharge, hoarse voice and sore throat.   Eyes: Negative for discharge, redness, vision loss in right eye and visual halos.  Cardiovascular: Negative for chest pain, dyspnea on  exertion, leg swelling, orthopnea and palpitations.  Respiratory: Negative for cough, hemoptysis, shortness of breath and snoring.   Endocrine: Negative for heat intolerance and polyphagia.  Hematologic/Lymphatic: Negative for bleeding problem. Does not bruise/bleed easily.  Skin: Negative for flushing, nail changes, rash and suspicious lesions.  Musculoskeletal: Negative for arthritis, joint pain, muscle cramps, myalgias, neck pain and stiffness.  Gastrointestinal: Negative for abdominal pain, bowel incontinence, diarrhea and excessive appetite.  Genitourinary: Negative for decreased libido, genital sores and incomplete emptying.  Neurological: Negative for brief paralysis, focal weakness, headaches and loss of balance.  Psychiatric/Behavioral: Negative for altered mental status, depression and suicidal ideas.  Allergic/Immunologic: Negative for HIV exposure and persistent infections.    EKGs/Labs/Other  Studies Reviewed:    The following studies were reviewed today:   EKG: None today  Echo 09/13/19 demonstrated  1. Left ventricular ejection fraction, by estimation, is 55 to 60%. The  left ventricle has normal function. The left ventricle has no regional wall motion abnormalities. There is mild concentric left ventricular hypertrophy. Left ventricular diastolic parameters are indeterminate.  2. Right ventricular systolic function is mildly reduced. The right ventricular size is mildly enlarged. Mildly increased right ventricular wall thickness. There is normal pulmonary artery systolic pressure.  3. Right atrial size was severely dilated.  4. The mitral valve is normal in structure. No evidence of mitral valve regurgitation. No evidence of mitral stenosis.  5. The aortic valve is tricuspid. Aortic valve regurgitation is mild. No aortic stenosis is present.  6. The inferior vena cava is dilated in size with >50% respiratory variability, suggesting right atrial pressure of 8 mmHg.     Recent Labs: 11/21/2019: ALT 14; TSH 1.750 02/16/2020: BUN 28; Creatinine, Ser 1.52; Magnesium 1.9; Potassium 4.1; Sodium 143 02/29/2020: Hemoglobin 12.7; Platelets 219  Recent Lipid Panel No results found for: CHOL, TRIG, HDL, CHOLHDL, VLDL, LDLCALC, LDLDIRECT  Physical Exam:    VS:  BP (!) 142/72   Pulse 62   Ht 5\' 7"  (1.702 m)   Wt 214 lb 12.8 oz (97.4 kg)   SpO2 98%   BMI 33.64 kg/m     Wt Readings from Last 3 Encounters:  05/21/20 214 lb 12.8 oz (97.4 kg)  03/28/20 223 lb 6.4 oz (101.3 kg)  02/29/20 205 lb (93 kg)     GEN: Well nourished, well developed in no acute distress HEENT: Normal NECK: No JVD; No carotid bruits LYMPHATICS: No lymphadenopathy CARDIAC: S1S2 noted,RRR, no murmurs, rubs, gallops RESPIRATORY:  Clear to auscultation without rales, wheezing or rhonchi  ABDOMEN: Soft, non-tender, non-distended, +bowel sounds, no guarding. EXTREMITIES: No edema, No cyanosis, no clubbing MUSCULOSKELETAL:  No deformity  SKIN: Warm and dry NEUROLOGIC:  Alert and oriented x 3, non-focal PSYCHIATRIC:  Normal affect, good insight  ASSESSMENT:    1. Paroxysmal atrial fibrillation (HCC)   2. Primary hypertension   3. Chronic diastolic heart failure ([place])   4. Mixed hyperlipidemia   5. Diabetes mellitus without complication (HCC)    PLAN:     I like to start cutting back on his amiodarone I will decrease that to 100 mg daily ideally since he has had his ablation would like to get him off of amiodarone.  In addition I am going to get blood work for CBC, BMP as well as vitamin D levels.  He has a bilateral leg edema and his blood pressure slightly elevated I am going to increase his Lasix to 40 mg daily from every other day.  This is being managed by his primary care doctor.  No adjustments for antidiabetic medications were made today.  The patient understands the need to lose weight with diet and exercise. We have discussed specific strategies for this.  The  patient is in agreement with the above plan. The patient left the office in stable condition.  The patient will follow up in 4 weeks or sooner if needed.   Medication Adjustments/Labs and Tests Ordered: Current medicines are reviewed at length with the patient today.  Concerns regarding medicines are outlined above.  Orders Placed This Encounter  Procedures  . Basic metabolic panel  . Magnesium  . CBC with Differential/Platelet  . TSH   Meds ordered this encounter  Medications  .  amiodarone (PACERONE) 200 MG tablet    Sig: Take 0.5 tablets (100 mg total) by mouth daily.    Dispense:  30 tablet    Refill:  3    Dose decrease  . furosemide (LASIX) 40 MG tablet    Sig: Take 1 tablet (40 mg total) by mouth daily.    DOSE CHANGE    There are no Patient Instructions on file for this visit.   Adopting a Healthy Lifestyle.  Know what a healthy weight is for you (roughly BMI <25) and aim to maintain this   Aim for 7+ servings of fruits and vegetables daily   65-80+ fluid ounces of water or unsweet tea for healthy kidneys   Limit to max 1 drink of alcohol per day; avoid smoking/tobacco   Limit animal fats in diet for cholesterol and heart health - choose grass fed whenever available   Avoid highly processed foods, and foods high in saturated/trans fats   Aim for low stress - take time to unwind and care for your mental health   Aim for 150 min of moderate intensity exercise weekly for heart health, and weights twice weekly for bone health   Aim for 7-9 hours of sleep daily   When it comes to diets, agreement about the perfect plan isnt easy to find, even among the experts. Experts at the [employer] developed an idea known as the Healthy Eating Plate. Just imagine a plate divided into logical, healthy portions.   The emphasis is on diet quality:   Load up on vegetables and fruits - one-half of your plate: Aim for color and variety, and remember that  potatoes dont count.   Go for whole grains - one-quarter of your plate: Whole wheat, barley, wheat berries, quinoa, oats, brown rice, and foods made with them. If you want pasta, go with whole wheat pasta.   Protein power - one-quarter of your plate: Fish, chicken, beans, and nuts are all healthy, versatile protein sources. Limit red meat.   The diet, however, does go beyond the plate, offering a few other suggestions.   Use healthy plant oils, such as olive, canola, soy, corn, sunflower and peanut. Check the labels, and avoid partially hydrogenated oil, which have unhealthy trans fats.   If youre thirsty, drink water. Coffee and tea are good in moderation, but skip sugary drinks and limit milk and dairy products to one or two daily servings.   The type of carbohydrate in the diet is more important than the amount. Some sources of carbohydrates, such as vegetables, fruits, whole grains, and beans-are healthier than others.   Finally, stay active  Signed, Berniece Salines, DO  05/21/2020 9:44 AM    [place]

## 2020-05-22 LAB — CBC WITH DIFFERENTIAL/PLATELET
Basophils Absolute: 0.1 10*3/uL (ref 0.0–0.2)
Basos: 1 %
EOS (ABSOLUTE): 0.2 10*3/uL (ref 0.0–0.4)
Eos: 2 %
Hematocrit: 41.1 % (ref 37.5–51.0)
Hemoglobin: 13.9 g/dL (ref 13.0–17.7)
Immature Grans (Abs): 0.1 10*3/uL (ref 0.0–0.1)
Immature Granulocytes: 1 %
Lymphocytes Absolute: 1.7 10*3/uL (ref 0.7–3.1)
Lymphs: 16 %
MCH: 33.5 pg — ABNORMAL HIGH (ref 26.6–33.0)
MCHC: 33.8 g/dL (ref 31.5–35.7)
MCV: 99 fL — ABNORMAL HIGH (ref 79–97)
Monocytes Absolute: 0.8 10*3/uL (ref 0.1–0.9)
Monocytes: 8 %
Neutrophils Absolute: 7.4 10*3/uL — ABNORMAL HIGH (ref 1.4–7.0)
Neutrophils: 72 %
Platelets: 227 10*3/uL (ref 150–450)
RBC: 4.15 x10E6/uL (ref 4.14–5.80)
RDW: 13.1 % (ref 11.6–15.4)
WBC: 10.2 10*3/uL (ref 3.4–10.8)

## 2020-05-22 LAB — BASIC METABOLIC PANEL
BUN/Creatinine Ratio: 31 — ABNORMAL HIGH (ref 10–24)
BUN: 29 mg/dL — ABNORMAL HIGH (ref 8–27)
CO2: 30 mmol/L — ABNORMAL HIGH (ref 20–29)
Calcium: 9.1 mg/dL (ref 8.6–10.2)
Chloride: 102 mmol/L (ref 96–106)
Creatinine, Ser: 0.94 mg/dL (ref 0.76–1.27)
GFR calc Af Amer: 93 mL/min/{1.73_m2} (ref >59)
GFR calc non Af Amer: 81 mL/min/{1.73_m2} (ref >59)
Glucose: 93 mg/dL (ref 65–99)
Potassium: 3.7 mmol/L (ref 3.5–5.2)
Sodium: 146 mmol/L — ABNORMAL HIGH (ref 134–144)

## 2020-05-22 LAB — MAGNESIUM: Magnesium: 1.8 mg/dL (ref 1.6–2.3)

## 2020-05-22 LAB — TSH: TSH: 3.01 u[IU]/mL (ref 0.450–4.500)

## 2020-05-22 LAB — VITAMIN D 25 HYDROXY (VIT D DEFICIENCY, FRACTURES): Vit D, 25-Hydroxy: 28.7 ng/mL — ABNORMAL LOW (ref 30.0–100.0)

## 2020-05-24 ENCOUNTER — Other Ambulatory Visit: Payer: Self-pay

## 2020-05-24 MED ORDER — VITAMIN D (ERGOCALCIFEROL) 1.25 MG (50000 UNIT) PO CAPS: ORAL_CAPSULE | ORAL | 0 refills | Status: DC

## 2020-05-31 DIAGNOSIS — M75111 Incomplete rotator cuff tear or rupture of right shoulder, not specified as traumatic: Secondary | ICD-10-CM | POA: Diagnosis not present

## 2020-05-31 DIAGNOSIS — M12811 Other specific arthropathies, not elsewhere classified, right shoulder: Secondary | ICD-10-CM | POA: Diagnosis not present

## 2020-05-31 DIAGNOSIS — M5412 Radiculopathy, cervical region: Secondary | ICD-10-CM | POA: Diagnosis not present

## 2020-06-18 ENCOUNTER — Other Ambulatory Visit: Payer: Self-pay

## 2020-06-18 ENCOUNTER — Encounter: Payer: Self-pay | Admitting: Cardiology

## 2020-06-18 ENCOUNTER — Ambulatory Visit: Payer: Medicare HMO | Admitting: Cardiology

## 2020-06-18 VITALS — BP 122/56 | HR 65 | Ht 67.0 in | Wt 206.0 lb

## 2020-06-18 DIAGNOSIS — I4819 Other persistent atrial fibrillation: Secondary | ICD-10-CM

## 2020-06-18 DIAGNOSIS — G4733 Obstructive sleep apnea (adult) (pediatric): Secondary | ICD-10-CM | POA: Diagnosis not present

## 2020-06-18 MED ORDER — APIXABAN 5 MG PO TABS: 5.0000 mg | ORAL_TABLET | Freq: Two times a day (BID) | ORAL | 3 refills | Status: DC

## 2020-06-18 MED ORDER — AMIODARONE HCL 200 MG PO TABS: 200.0000 mg | ORAL_TABLET | Freq: Every day | ORAL | 3 refills | Status: DC

## 2020-06-18 NOTE — Progress Notes (Signed)
Electrophysiology Office Note   Date:  06/18/2020   ID:  Logan [name], DOB 1946/03/26, MRN [id number]  PCP:  [name], MD  Cardiologist:  [place] Primary Electrophysiologist:  Fernado Brigante Meredith Leeds, MD    Chief Complaint: AF   History of Present Illness: Logan [name] is a 75 y.o. male who is being seen today for the evaluation of AF at the request of [name], MD. Presenting today for electrophysiology evaluation.  He has a history significant for atrial fibrillation, hypertension, hyperlipidemia, prediabetes, and obesity.  He had an attempted cardioversion, but unfortunately was shocked 3 times with 200 J and did not convert to sinus rhythm.  He was started on amiodarone.  He is now status post AF ablation on 02/29/2020.  Today, denies symptoms of palpitations, chest pain, shortness of breath, orthopnea, PND, lower extremity edema, claudication, dizziness, presyncope, syncope, bleeding, or neurologic sequela. The patient is tolerating medications without difficulties.  He feels weak and fatigued today.  Unfortunately he is back in atrial fibrillation.  His amiodarone dose was decreased to 100 mg.  He also has lower extremity edema.  He is also been getting cold which he attributes to his blood thinner.  He does not have shortness of breath or chest pain.   Past Medical History:  Diagnosis Date  . Anxiety   . Atherosclerotic renal artery stenosis, unilateral ([place]) 02/28/2014  . Atrial fibrillation ([place])   . Bilateral lower extremity edema   . Chronic diastolic heart failure ([place]) 10/21/2019  . Diabetes mellitus without complication ([place])   . DVT (deep venous thrombosis) ([place])   . Esophageal reflux   . Essential hypertension 10/21/2019  . Gout   . Hyperlipidemia   . Hypertension   . Mixed hyperlipidemia 10/21/2019  . Morbid obesity ([place])   . Obesity (BMI 30-39.9) 08/24/2019  . Orthopnea   . Osteoarthritis   . Persistent atrial fibrillation ([place]) 10/21/2019  . Seasonal  allergies   . Sleep apnea    Past Surgical History:  Procedure Laterality Date  . ABDOMINAL AORTAGRAM N/A 02/28/2014   Procedure: ABDOMINAL AORTAGRAM;  Surgeon: Serafina Mitchell, MD;  Location: [facility] CATH LAB;  Service: Cardiovascular;  Laterality: N/A;  . APPENDECTOMY    . ATRIAL FIBRILLATION ABLATION N/A 02/29/2020   Procedure: ATRIAL FIBRILLATION ABLATION;  Surgeon: Constance Haw, MD;  Location: [place] [facility] CV LAB;  Service: Cardiovascular;  Laterality: N/A;  . CARPAL TUNNEL RELEASE Left      Current Outpatient Medications  Medication Sig Dispense Refill  . allopurinol (ZYLOPRIM) 300 MG tablet Take 300 mg by mouth daily.    Marland Kitchen amiodarone (PACERONE) 200 MG tablet Take 1 tablet (200 mg total) by mouth daily. 30 tablet 3  . apixaban (ELIQUIS) 5 MG TABS tablet Take 1 tablet (5 mg total) by mouth 2 (two) times daily. 60 tablet 3  . carvedilol (COREG) 12.5 MG tablet Take 12.5 mg by mouth 2 (two) times daily with a meal.    . Cholecalciferol (VITAMIN D-3) 1000 UNITS CAPS Take 1,000 Units by mouth daily.    Marland Kitchen DHEA 25 MG CAPS Take 25 mg by mouth daily.    Marland Kitchen doxazosin (CARDURA) 8 MG tablet Take 8 mg by mouth daily.     . fluticasone (FLONASE) 50 MCG/ACT nasal spray Place 1 spray into both nostrils daily as needed for rhinitis.     . furosemide (LASIX) 40 MG tablet Take 1 tablet (40 mg total) by mouth daily.    . hydrALAZINE (APRESOLINE) 25  MG tablet Take 25 mg by mouth in the morning and at bedtime.     Marland Kitchen ketoconazole (NIZORAL) 2 % cream Apply 1 application topically daily as needed for irritation.     Marland Kitchen loratadine (CLARITIN) 10 MG tablet Take 10 mg by mouth daily as needed for allergies.    Marland Kitchen losartan (COZAAR) 100 MG tablet Take 100 mg by mouth daily.    . meloxicam (MOBIC) 7.5 MG tablet Take 7.5 mg by mouth 2 (two) times daily as needed.     Marland Kitchen omeprazole (PRILOSEC) 20 MG capsule Take 20 mg by mouth daily as needed (for heartburn).     . potassium chloride SA (KLOR-CON) 20 MEQ tablet Take  1 tablet (20 mEq total) by mouth daily. 90 tablet 1  . pravastatin (PRAVACHOL) 20 MG tablet Take 20 mg by mouth daily.     . traMADol (ULTRAM) 50 MG tablet Take 50 mg by mouth daily as needed for moderate pain.     . vitamin B-12 (CYANOCOBALAMIN) 1000 MCG tablet Take 1,000 mcg by mouth daily.    . Vitamin D, Ergocalciferol, (DRISDOL) 1.25 MG (50000 UNIT) CAPS capsule Take 1 capsule every 7 days. (Take one capsule weekly for 12 weeks) 12 capsule 0   No current facility-administered medications for this visit.    Allergies:   Patient has no known allergies.   Social History:  The patient  reports that he quit smoking about 23 years ago. His smokeless tobacco use includes chew. He reports that he does not drink alcohol and does not use drugs.   Family History:  The patient's family history includes Cancer in his father; Heart disease in his mother.   ROS:  Please see the history of present illness.   Otherwise, review of systems is positive for none.   All other systems are reviewed and negative.   PHYSICAL EXAM: VS:  BP (!) 122/56   Pulse 65   Ht 5\' 7"  (1.702 m)   Wt 206 lb (93.4 kg)   SpO2 97%   BMI 32.26 kg/m  , BMI Body mass index is 32.26 kg/m. GEN: Well nourished, well developed, in no acute distress  HEENT: normal  Neck: no JVD, carotid bruits, or masses Cardiac: Irregular; no murmurs, rubs, or gallops, 2+ edema  Respiratory:  clear to auscultation bilaterally, normal work of breathing GI: soft, nontender, nondistended, + BS MS: no deformity or atrophy  Skin: warm and dry Neuro:  Strength and sensation are intact Psych: euthymic mood, full affect  EKG:  EKG is ordered today. Personal review of the ekg ordered shows atrial fibrillation, rate 65  Recent Labs: 11/21/2019: ALT 14 05/21/2020: BUN 29; Creatinine, Ser 0.94; Hemoglobin 13.9; Magnesium 1.8; Platelets 227; Potassium 3.7; Sodium 146; TSH 3.010    Lipid Panel  No results found for: CHOL, TRIG, HDL, CHOLHDL, VLDL,  LDLCALC, LDLDIRECT   Wt Readings from Last 3 Encounters:  06/18/20 206 lb (93.4 kg)  05/21/20 214 lb 12.8 oz (97.4 kg)  03/28/20 223 lb 6.4 oz (101.3 kg)      Other studies Reviewed: Additional studies/ records that were reviewed today include: TTE 09/13/19  Review of the above records today demonstrates:  1. Left ventricular ejection fraction, by estimation, is 55 to 60%. The  left ventricle has normal function. The left ventricle has no regional  wall motion abnormalities. There is mild concentric left ventricular  hypertrophy. Left ventricular diastolic  parameters are indeterminate.  2. Right ventricular systolic function is mildly reduced.  The right  ventricular size is mildly enlarged. Mildly increased right ventricular  wall thickness. There is normal pulmonary artery systolic pressure.  3. Right atrial size was severely dilated.  4. The mitral valve is normal in structure. No evidence of mitral valve  regurgitation. No evidence of mitral stenosis.  5. The aortic valve is tricuspid. Aortic valve regurgitation is mild. No  aortic stenosis is present.  6. The inferior vena cava is dilated in size with >50% respiratory  variability, suggesting right atrial pressure of 8 mmHg.    ASSESSMENT AND PLAN:  1.  Persistent atrial fibrillation: Status post ablation 02/29/2020.  Currently on amiodarone, carvedilol, Xarelto.  CHA2DS2-VASc of 2.  High risk medication monitoring performed.  He remains in atrial fibrillation.  Due to that, we Jackelin Correia increase his amiodarone to 200 mg a day.  We [place] Vandergriff plan for cardioversion.  He is also been quite cold which he attributes to his Xarelto.  We Tesia Lybrand switch to Eliquis.  He also has lower extremity edema.  He Maddelyn Rocca take his Lasix twice daily for the next 4 days.  2.  Obstructive sleep apnea: CPAP compliance encouraged.  Refer to sleep medicine  3.  Obesity: Diet and exercise encouraged  4.:  Currently well controlled   Current medicines are  reviewed at length with the patient today.   The patient does not have concerns regarding his medicines.  The following changes were made today: Increase amiodarone, stop Xarelto, start Eliquis  Labs/ tests ordered today include:  Orders Placed This Encounter  Procedures  . Ambulatory referral to Cardiology  . EKG 12-Lead     Disposition:   FU with Kyrstan Gotwalt 3 months  Signed, Avrie Kedzierski Meredith Leeds, MD  06/18/2020 11:57 AM     [facility] [address] [place] [address] [phone] (office) [phone] (fax)

## 2020-06-18 NOTE — Patient Instructions (Addendum)
Medication Instructions:  Your physician has recommended you make the following change in your medication:  1. INCREASE Lasix to 40 mg TWICE a day for 4 days, then return to normal dosing on once daily 2. STOP Xarelto 3. START Eliquis 5 mg TWICE a day 4. INCREASE Amiodarone to 200 mg once daily  *If you need a refill on your cardiac medications before your next appointment, please call your pharmacy*   Lab Work: None ordered If you have labs (blood work) drawn today and your tests are completely normal, you will receive your results only by: Marland Kitchen MyChart Message (if you have MyChart) OR . A paper copy in the mail If you have any lab test that is abnormal or we need to change your treatment, we will call you to review the results.   Testing/Procedures: Your physician has recommended that you have a Cardioversion (DCCV). Electrical Cardioversion uses a jolt of electricity to your heart either through paddles or wired patches attached to your chest. This is a controlled, usually prescheduled, procedure. Defibrillation is done under light anesthesia in the hospital, and you usually go home the day of the procedure. This is done to get your heart back into a normal rhythm. You are not awake for the procedure. Please see the instruction sheets below located under "other instructions".   The nurse will call you to schedule this procedure     Follow-Up: At [facility], you and your health needs are our priority.  As part of our continuing mission to provide you with exceptional heart care, we have created designated Provider Care Teams.  These Care Teams include your primary Cardiologist (physician) and Advanced Practice Providers (APPs -  Physician Assistants and Nurse Practitioners) who all work together to provide you with the care you need, when you need it.  We recommend signing up for the patient portal called "MyChart".  Sign up information is provided on this After Visit Summary.  MyChart  is used to connect with patients for Virtual Visits (Telemedicine).  Patients are able to view lab/test results, encounter notes, upcoming appointments, etc.  Non-urgent messages can be sent to your provider as well.   To learn more about what you can do with MyChart, go to [email or web address].    Your next appointment:   3 month(s)  The format for your next appointment:   In Person  Provider:   Allegra Lai, MD    Thank you for choosing [facility]!!   Trinidad Curet, RN [phone]   Other Instructions   You are scheduled for a Cardioversion on __________ with Dr. ______+____.  Please arrive [facility] at ___________ am/pm.   DIET: Nothing to eat or drink after midnight except a sip of water with medications (see medication instructions below)  Medication Instructions: Hold __________  Continue your anticoagulant: Eliquis You will need to continue your anticoagulant after your procedure until you are told by your provider that it is safe to stop   Labs: _______________  Dennis Bast must have a responsible person to drive you home and stay in the waiting area during your procedure. Failure to do so could result in cancellation.  Bring your insurance cards.  *Special Note: Every effort is made to have your procedure done on time. Occasionally there are emergencies that occur at the hospital that may cause delays. Please be patient if a delay does occur.

## 2020-06-19 ENCOUNTER — Ambulatory Visit: Payer: Medicare HMO | Admitting: Cardiology

## 2020-06-19 ENCOUNTER — Encounter: Payer: Self-pay | Admitting: Cardiology

## 2020-06-19 VITALS — BP 118/60 | HR 70 | Ht 67.0 in | Wt 207.4 lb

## 2020-06-19 DIAGNOSIS — I10 Essential (primary) hypertension: Secondary | ICD-10-CM | POA: Diagnosis not present

## 2020-06-19 DIAGNOSIS — Z79899 Other long term (current) drug therapy: Secondary | ICD-10-CM

## 2020-06-19 DIAGNOSIS — I4819 Other persistent atrial fibrillation: Secondary | ICD-10-CM

## 2020-06-19 DIAGNOSIS — I5032 Chronic diastolic (congestive) heart failure: Secondary | ICD-10-CM | POA: Diagnosis not present

## 2020-06-19 DIAGNOSIS — G4733 Obstructive sleep apnea (adult) (pediatric): Secondary | ICD-10-CM

## 2020-06-19 NOTE — Progress Notes (Signed)
Cardiology Office Note:    Date:  06/19/2020   ID:  Logan [name], DOB May 28, 1945, MRN [id number]  PCP:  [name], MD  Cardiologist:  Berniece Salines, DO  Electrophysiologist:  Constance Haw, MD   Referring MD: [name], MD     History of Present Illness:    Logan [name] is a 75 y.o. male with  hx of of atrial fibrillation on Xarelto and carvedilol, hypertension, hyperlipidemia, prediabetes and morbid obesity.  I saw the patientinAugust 2, 2021 at that time he was status post cardioversion however he was shocked 3 times with 200 J but unfortunately patient did not convert into sinus rhythm. At his last visit I started the patient amiodarone hoping to get him converted to sinus rhythm. During that time he was planning colonoscopy I did asked the patient to please wait until after August 20 which should been a good 4 weeks after his procedure.  The patient was seen in theSeptember 2, 2021 at that time he still was in atrial fibrillation despite amiodarone.  He underwent EP ablation in November 2021 and appears to be doing well he was seen in the EP clinic in December 20, at that time he appeared to be in sinus rhythm discontinue his anticoagulation his beta-blocker.  His last visit we decreased the amiodarone but since then he has been back in atrial fibrillation therefore his amiodarone was increased by EP. He complained of pain significantly: Xarelto so this was stopped and he was started on Eliquis.  He tells me that he still feels weak and tired.  And still feels cold.  Past Medical History:  Diagnosis Date  . Anxiety   . Atherosclerotic renal artery stenosis, unilateral ([place]) 02/28/2014  . Atrial fibrillation ([place])   . Bilateral lower extremity edema   . Chronic diastolic heart failure ([place]) 10/21/2019  . Diabetes mellitus without complication ([place])   . DVT (deep venous thrombosis) ([place])   . Esophageal reflux   . Essential hypertension 10/21/2019  .  Gout   . Hyperlipidemia   . Hypertension   . Mixed hyperlipidemia 10/21/2019  . Morbid obesity ([place])   . Obesity (BMI 30-39.9) 08/24/2019  . Orthopnea   . Osteoarthritis   . Persistent atrial fibrillation ([place]) 10/21/2019  . Seasonal allergies   . Sleep apnea     Past Surgical History:  Procedure Laterality Date  . ABDOMINAL AORTAGRAM N/A 02/28/2014   Procedure: ABDOMINAL AORTAGRAM;  Surgeon: Serafina Mitchell, MD;  Location: [facility] CATH LAB;  Service: Cardiovascular;  Laterality: N/A;  . APPENDECTOMY    . ATRIAL FIBRILLATION ABLATION N/A 02/29/2020   Procedure: ATRIAL FIBRILLATION ABLATION;  Surgeon: Constance Haw, MD;  Location: [facility] CV LAB;  Service: Cardiovascular;  Laterality: N/A;  . CARPAL TUNNEL RELEASE Left     Current Medications: Current Meds  Medication Sig  . allopurinol (ZYLOPRIM) 300 MG tablet Take 300 mg by mouth daily.  Marland Kitchen amiodarone (PACERONE) 200 MG tablet Take 1 tablet (200 mg total) by mouth daily.  Marland Kitchen apixaban (ELIQUIS) 5 MG TABS tablet Take 1 tablet (5 mg total) by mouth 2 (two) times daily.  . carvedilol (COREG) 12.5 MG tablet Take 12.5 mg by mouth 2 (two) times daily with a meal.  . Cholecalciferol (VITAMIN D-3) 1000 UNITS CAPS Take 1,000 Units by mouth daily.  Marland Kitchen DHEA 25 MG CAPS Take 25 mg by mouth daily.  Marland Kitchen doxazosin (CARDURA) 8 MG tablet Take 8 mg by mouth daily.   . fluticasone (FLONASE)  50 MCG/ACT nasal spray Place 1 spray into both nostrils daily as needed for rhinitis.   . furosemide (LASIX) 40 MG tablet Take 1 tablet (40 mg total) by mouth daily.  . hydrALAZINE (APRESOLINE) 25 MG tablet Take 25 mg by mouth in the morning and at bedtime.   Marland Kitchen ketoconazole (NIZORAL) 2 % cream Apply 1 application topically daily as needed for irritation.   Marland Kitchen loratadine (CLARITIN) 10 MG tablet Take 10 mg by mouth daily as needed for allergies.  Marland Kitchen losartan (COZAAR) 100 MG tablet Take 100 mg by mouth daily.  . meloxicam (MOBIC) 7.5 MG tablet Take 7.5 mg by mouth 2 (two)  times daily as needed.   Marland Kitchen omeprazole (PRILOSEC) 20 MG capsule Take 20 mg by mouth daily as needed (for heartburn).   . potassium chloride SA (KLOR-CON) 20 MEQ tablet Take 1 tablet (20 mEq total) by mouth daily.  . pravastatin (PRAVACHOL) 20 MG tablet Take 20 mg by mouth daily.   . traMADol (ULTRAM) 50 MG tablet Take 50 mg by mouth daily as needed for moderate pain.   . vitamin B-12 (CYANOCOBALAMIN) 1000 MCG tablet Take 1,000 mcg by mouth daily.  . Vitamin D, Ergocalciferol, (DRISDOL) 1.25 MG (50000 UNIT) CAPS capsule Take 1 capsule every 7 days. (Take one capsule weekly for 12 weeks)     Allergies:   Patient has no known allergies.   Social History   Socioeconomic History  . Marital status: Widowed    Spouse name: Not on file  . Number of children: Not on file  . Years of education: Not on file  . Highest education level: Not on file  Occupational History  . Not on file  Tobacco Use  . Smoking status: Former Smoker    Quit date: 02/07/1997    Years since quitting: 23.3  . Smokeless tobacco: Current User    Types: Chew  Substance and Sexual Activity  . Alcohol use: Never  . Drug use: Never  . Sexual activity: Not on file  Other Topics Concern  . Not on file  Social History Narrative  . Not on file   Social Determinants of Health   Financial Resource Strain: Not on file  Food Insecurity: Not on file  Transportation Needs: Not on file  Physical Activity: Not on file  Stress: Not on file  Social Connections: Not on file     Family History: The patient's family history includes Cancer in his father; Heart disease in his mother.  ROS:   Review of Systems  Constitution: Negative for decreased appetite, fever and weight gain.  HENT: Negative for congestion, ear discharge, hoarse voice and sore throat.   Eyes: Negative for discharge, redness, vision loss in right eye and visual halos.  Cardiovascular: Negative for chest pain, dyspnea on exertion, leg swelling, orthopnea  and palpitations.  Respiratory: Negative for cough, hemoptysis, shortness of breath and snoring.   Endocrine: Negative for heat intolerance and polyphagia.  Hematologic/Lymphatic: Negative for bleeding problem. Does not bruise/bleed easily.  Skin: Negative for flushing, nail changes, rash and suspicious lesions.  Musculoskeletal: Negative for arthritis, joint pain, muscle cramps, myalgias, neck pain and stiffness.  Gastrointestinal: Negative for abdominal pain, bowel incontinence, diarrhea and excessive appetite.  Genitourinary: Negative for decreased libido, genital sores and incomplete emptying.  Neurological: Negative for brief paralysis, focal weakness, headaches and loss of balance.  Psychiatric/Behavioral: Negative for altered mental status, depression and suicidal ideas.  Allergic/Immunologic: Negative for HIV exposure and persistent infections.  EKGs/Labs/Other Studies Reviewed:    The following studies were reviewed today:   EKG: None today  Echo5/25/21demonstrated  1. Left ventricular ejection fraction, by estimation, is 55 to 60%. The  left ventricle has normal function. The left ventricle has no regional wall motion abnormalities. There is mild concentric left ventricular hypertrophy. Left ventricular diastolic parameters are indeterminate.  2. Right ventricular systolic function is mildly reduced. The right ventricular size is mildly enlarged. Mildly increased right ventricular wall thickness. There is normal pulmonary artery systolic pressure.  3. Right atrial size was severely dilated.  4. The mitral valve is normal in structure. No evidence of mitral valve regurgitation. No evidence of mitral stenosis.  5. The aortic valve is tricuspid. Aortic valve regurgitation is mild. No aortic stenosis is present.  6. The inferior vena cava is dilated in size with >50% respiratory variability, suggesting right atrial pressure of 8 mmHg.   Recent Labs: 11/21/2019: ALT  14 05/21/2020: BUN 29; Creatinine, Ser 0.94; Hemoglobin 13.9; Magnesium 1.8; Platelets 227; Potassium 3.7; Sodium 146; TSH 3.010  Recent Lipid Panel No results found for: CHOL, TRIG, HDL, CHOLHDL, VLDL, LDLCALC, LDLDIRECT  Physical Exam:    VS:  There were no vitals taken for this visit.    Wt Readings from Last 3 Encounters:  06/18/20 206 lb (93.4 kg)  05/21/20 214 lb 12.8 oz (97.4 kg)  03/28/20 223 lb 6.4 oz (101.3 kg)     GEN: Well nourished, well developed in no acute distress HEENT: Normal NECK: No JVD; No carotid bruits LYMPHATICS: No lymphadenopathy CARDIAC: S1S2 noted,RRR, no murmurs, rubs, gallops RESPIRATORY:  Clear to auscultation without rales, wheezing or rhonchi  ABDOMEN: Soft, non-tender, non-distended, +bowel sounds, no guarding. EXTREMITIES: Bilateral +1 edema, No cyanosis, no clubbing MUSCULOSKELETAL:  No deformity  SKIN: Warm and dry NEUROLOGIC:  Alert and oriented x 3, non-focal PSYCHIATRIC:  Normal affect, good insight  ASSESSMENT:    1. Persistent atrial fibrillation ([place])   2. Chronic diastolic heart failure ([place])   3. Essential hypertension   4. Obstructive sleep apnea syndrome    PLAN:     He saw EPS today and his amiodarone was increased he is pending DC cardioversion.  He also will take Lasix 40 mg twice daily for the next 4 days.  He has a CPAP machine but had not been quite compliant with it referral is pending for sleep medicine.  The patient understands the need to lose weight with diet and exercise. We have discussed specific strategies for this.  Blood work will be done today for medication management in the setting of his increased diuretic dosing. The patient is in agreement with the above plan. The patient left the office in stable condition.  The patient will follow up in 6 months or sooner if needed.   Medication Adjustments/Labs and Tests Ordered: Current medicines are reviewed at length with the patient today.  Concerns regarding  medicines are outlined above.  No orders of the defined types were placed in this encounter.  No orders of the defined types were placed in this encounter.   There are no Patient Instructions on file for this visit.   Adopting a Healthy Lifestyle.  Know what a healthy weight is for you (roughly BMI <25) and aim to maintain this   Aim for 7+ servings of fruits and vegetables daily   65-80+ fluid ounces of water or unsweet tea for healthy kidneys   Limit to max 1 drink of alcohol per day; avoid smoking/tobacco  Limit animal fats in diet for cholesterol and heart health - choose grass fed whenever available   Avoid highly processed foods, and foods high in saturated/trans fats   Aim for low stress - take time to unwind and care for your mental health   Aim for 150 min of moderate intensity exercise weekly for heart health, and weights twice weekly for bone health   Aim for 7-9 hours of sleep daily   When it comes to diets, agreement about the perfect plan isnt easy to find, even among the experts. Experts at the [employer] developed an idea known as the Healthy Eating Plate. Just imagine a plate divided into logical, healthy portions.   The emphasis is on diet quality:   Load up on vegetables and fruits - one-half of your plate: Aim for color and variety, and remember that potatoes dont count.   Go for whole grains - one-quarter of your plate: Whole wheat, barley, wheat berries, quinoa, oats, brown rice, and foods made with them. If you want pasta, go with whole wheat pasta.   Protein power - one-quarter of your plate: Fish, chicken, beans, and nuts are all healthy, versatile protein sources. Limit red meat.   The diet, however, does go beyond the plate, offering a few other suggestions.   Use healthy plant oils, such as olive, canola, soy, corn, sunflower and peanut. Check the labels, and avoid partially hydrogenated oil, which have unhealthy trans  fats.   If youre thirsty, drink water. Coffee and tea are good in moderation, but skip sugary drinks and limit milk and dairy products to one or two daily servings.   The type of carbohydrate in the diet is more important than the amount. Some sources of carbohydrates, such as vegetables, fruits, whole grains, and beans-are healthier than others.   Finally, stay active  Signed, Berniece Salines, DO  06/19/2020 1:59 PM    [facility]

## 2020-06-19 NOTE — Patient Instructions (Signed)
Medication Instructions:  Your physician recommends that you continue on your current medications as directed. Please refer to the Current Medication list given to you today.  *If you need a refill on your cardiac medications before your next appointment, please call your pharmacy*   Lab Work: Your physician recommends that you return for lab work: TODAY: BMET,Mag, CBC If you have labs (blood work) drawn today and your tests are completely normal, you will receive your results only by: . MyChart Message (if you have MyChart) OR . A paper copy in the mail If you have any lab test that is abnormal or we need to change your treatment, we will call you to review the results.   Testing/Procedures: None   Follow-Up: At [facility], you and your health needs are our priority.  As part of our continuing mission to provide you with exceptional heart care, we have created designated Provider Care Teams.  These Care Teams include your primary Cardiologist (physician) and Advanced Practice Providers (APPs -  Physician Assistants and Nurse Practitioners) who all work together to provide you with the care you need, when you need it.  We recommend signing up for the patient portal called "MyChart".  Sign up information is provided on this After Visit Summary.  MyChart is used to connect with patients for Virtual Visits (Telemedicine).  Patients are able to view lab/test results, encounter notes, upcoming appointments, etc.  Non-urgent messages can be sent to your provider as well.   To learn more about what you can do with MyChart, go to [email or web address].    Your next appointment:   6 month(s)  The format for your next appointment:   In Person  Provider:   Kardie Tobb, DO   Other Instructions   

## 2020-06-20 LAB — MAGNESIUM: Magnesium: 1.7 mg/dL (ref 1.6–2.3)

## 2020-06-20 LAB — CBC WITH DIFFERENTIAL/PLATELET
Basophils Absolute: 0 10*3/uL (ref 0.0–0.2)
Basos: 1 %
EOS (ABSOLUTE): 0.1 10*3/uL (ref 0.0–0.4)
Eos: 2 %
Hematocrit: 40.8 % (ref 37.5–51.0)
Hemoglobin: 13.7 g/dL (ref 13.0–17.7)
Immature Grans (Abs): 0.1 10*3/uL (ref 0.0–0.1)
Immature Granulocytes: 1 %
Lymphocytes Absolute: 1.5 10*3/uL (ref 0.7–3.1)
Lymphs: 17 %
MCH: 33.3 pg — ABNORMAL HIGH (ref 26.6–33.0)
MCHC: 33.6 g/dL (ref 31.5–35.7)
MCV: 99 fL — ABNORMAL HIGH (ref 79–97)
Monocytes Absolute: 0.7 10*3/uL (ref 0.1–0.9)
Monocytes: 8 %
Neutrophils Absolute: 6.2 10*3/uL (ref 1.4–7.0)
Neutrophils: 71 %
Platelets: 237 10*3/uL (ref 150–450)
RBC: 4.11 x10E6/uL — ABNORMAL LOW (ref 4.14–5.80)
RDW: 13.8 % (ref 11.6–15.4)
WBC: 8.5 10*3/uL (ref 3.4–10.8)

## 2020-06-20 LAB — BASIC METABOLIC PANEL
BUN/Creatinine Ratio: 22 (ref 10–24)
BUN: 23 mg/dL (ref 8–27)
CO2: 27 mmol/L (ref 20–29)
Calcium: 9.4 mg/dL (ref 8.6–10.2)
Chloride: 100 mmol/L (ref 96–106)
Creatinine, Ser: 1.06 mg/dL (ref 0.76–1.27)
Glucose: 81 mg/dL (ref 65–99)
Potassium: 4.2 mmol/L (ref 3.5–5.2)
Sodium: 142 mmol/L (ref 134–144)
eGFR: 75 mL/min/{1.73_m2} (ref >59)

## 2020-06-25 ENCOUNTER — Telehealth: Payer: Self-pay

## 2020-06-25 NOTE — Telephone Encounter (Signed)
-----   Message from Berniece Salines, DO sent at 06/22/2020 12:13 PM EST ----- Labs stable.

## 2020-06-25 NOTE — Telephone Encounter (Signed)
Spoke with patient regarding results and recommendation.  Patient verbalizes understanding and is agreeable to plan of care. Advised patient to call back with any issues or concerns.  

## 2020-06-28 ENCOUNTER — Telehealth: Payer: Self-pay | Admitting: Cardiology

## 2020-06-28 NOTE — Telephone Encounter (Signed)
Called patient regarding referral that Dr. Curt Bears placed for sleep medicine. Patient does not want to come to [facility]. He wants to know if there is somewhere in [place] he can go. Please advise.

## 2020-07-03 DIAGNOSIS — M159 Polyosteoarthritis, unspecified: Secondary | ICD-10-CM | POA: Diagnosis not present

## 2020-07-03 DIAGNOSIS — K219 Gastro-esophageal reflux disease without esophagitis: Secondary | ICD-10-CM | POA: Diagnosis not present

## 2020-07-03 DIAGNOSIS — E1169 Type 2 diabetes mellitus with other specified complication: Secondary | ICD-10-CM | POA: Diagnosis not present

## 2020-07-03 DIAGNOSIS — Z6833 Body mass index (BMI) 33.0-33.9, adult: Secondary | ICD-10-CM | POA: Diagnosis not present

## 2020-07-03 DIAGNOSIS — D6869 Other thrombophilia: Secondary | ICD-10-CM | POA: Diagnosis not present

## 2020-07-03 DIAGNOSIS — M109 Gout, unspecified: Secondary | ICD-10-CM | POA: Diagnosis not present

## 2020-07-03 DIAGNOSIS — I10 Essential (primary) hypertension: Secondary | ICD-10-CM | POA: Diagnosis not present

## 2020-07-03 DIAGNOSIS — I4891 Unspecified atrial fibrillation: Secondary | ICD-10-CM | POA: Diagnosis not present

## 2020-07-03 DIAGNOSIS — E785 Hyperlipidemia, unspecified: Secondary | ICD-10-CM | POA: Diagnosis not present

## 2020-07-12 ENCOUNTER — Telehealth: Payer: Self-pay | Admitting: *Deleted

## 2020-07-12 NOTE — Telephone Encounter (Signed)
Will do. Thank you

## 2020-07-12 NOTE — Telephone Encounter (Signed)
Logan [name] can you please help with this. Thanks

## 2020-07-12 NOTE — Telephone Encounter (Signed)
Called pt to review DCCV instructions. Pt will stop by urgent care next Tuesday, 3/29, for covid testing and will drop result off to [place] office. Pt aware to arrive to [facility] on 3/31 @ 6:30 am for procedure. Aware NPO after MN night before, hold all medications morning of procedure. Aware to have driver. Patient verbalized understanding and agreeable to plan.    Will forward note to Dr. Terrial Rhodes nurse so she is aware of pt stopping by office next Tuesday w/ Covid result to fax to [facility].

## 2020-07-12 NOTE — Telephone Encounter (Signed)
Dr Radford Pax does not go to [facility] or [place] for sleep studies. He may have to see Pulmonary there and have they will have to follow him.

## 2020-07-13 NOTE — Telephone Encounter (Signed)
He can get a home sleep study but will have to come to [facility] for his sleep OV

## 2020-07-16 ENCOUNTER — Telehealth: Payer: Self-pay

## 2020-07-16 NOTE — Telephone Encounter (Signed)
Spoke with Sydell Axon, CMA she states when speaking with the patient about his sleep study he states "I do not want to do it in [place], I want to do it in [place]." Will speak to Dr. Harriet Masson to ensure changing the referral to [facility] is appropriate.

## 2020-07-16 NOTE — Telephone Encounter (Signed)
Reached back out to patient to let him know he cuold do a home sleep study but he would still have to come to [facility] for his sleep follow up with dr Radford Pax. Patient states he would rather do everything in [place]. I reached out to dr Quintella Reichert nurse Delana Meyer to let her know and to inform dr Harriet Masson and be advised which direction to take concerning this patient.

## 2020-07-17 ENCOUNTER — Telehealth: Payer: Self-pay | Admitting: Cardiology

## 2020-07-17 DIAGNOSIS — Z20828 Contact with and (suspected) exposure to other viral communicable diseases: Secondary | ICD-10-CM | POA: Diagnosis not present

## 2020-07-17 DIAGNOSIS — Z1159 Encounter for screening for other viral diseases: Secondary | ICD-10-CM | POA: Diagnosis not present

## 2020-07-17 NOTE — Telephone Encounter (Signed)
Follow Up:     Pt wants to know if his Covid test result is back? He is supposed to have surgery.

## 2020-07-17 NOTE — Telephone Encounter (Signed)
Spoke to pt and his fiance. They reported he got covid tested this morning and was calling to make sure hospital has everything they need and procedure was still a go. Aware that I will call [facility] to confirm and only call pt back if updated instructions needed.  Confirmed w/ [facility] that covid testing received and confirmed procedure still a go.

## 2020-07-17 NOTE — Telephone Encounter (Signed)
Sherri- looks like you scheduled pt for Cardioversion at [facility] when you guys seen him at the end of Feb.  I am unable to see that a covid test has been done.  Is there a way you can see results from [facility]?

## 2020-07-19 DIAGNOSIS — I4891 Unspecified atrial fibrillation: Secondary | ICD-10-CM | POA: Diagnosis not present

## 2020-07-19 DIAGNOSIS — F1021 Alcohol dependence, in remission: Secondary | ICD-10-CM | POA: Diagnosis not present

## 2020-07-19 DIAGNOSIS — E785 Hyperlipidemia, unspecified: Secondary | ICD-10-CM | POA: Diagnosis not present

## 2020-07-19 DIAGNOSIS — I10 Essential (primary) hypertension: Secondary | ICD-10-CM | POA: Diagnosis not present

## 2020-07-19 DIAGNOSIS — I5032 Chronic diastolic (congestive) heart failure: Secondary | ICD-10-CM | POA: Diagnosis not present

## 2020-07-19 DIAGNOSIS — E1169 Type 2 diabetes mellitus with other specified complication: Secondary | ICD-10-CM | POA: Diagnosis not present

## 2020-07-19 DIAGNOSIS — E1122 Type 2 diabetes mellitus with diabetic chronic kidney disease: Secondary | ICD-10-CM | POA: Diagnosis not present

## 2020-07-19 DIAGNOSIS — I480 Paroxysmal atrial fibrillation: Secondary | ICD-10-CM

## 2020-07-19 DIAGNOSIS — G4733 Obstructive sleep apnea (adult) (pediatric): Secondary | ICD-10-CM | POA: Diagnosis not present

## 2020-07-19 DIAGNOSIS — N189 Chronic kidney disease, unspecified: Secondary | ICD-10-CM | POA: Diagnosis not present

## 2020-07-19 DIAGNOSIS — I130 Hypertensive heart and chronic kidney disease with heart failure and stage 1 through stage 4 chronic kidney disease, or unspecified chronic kidney disease: Secondary | ICD-10-CM | POA: Diagnosis not present

## 2020-07-24 DIAGNOSIS — I10 Essential (primary) hypertension: Secondary | ICD-10-CM | POA: Diagnosis not present

## 2020-07-27 ENCOUNTER — Ambulatory Visit: Payer: Medicare HMO | Admitting: Cardiology

## 2020-07-27 ENCOUNTER — Other Ambulatory Visit: Payer: Self-pay

## 2020-07-27 ENCOUNTER — Encounter: Payer: Self-pay | Admitting: Cardiology

## 2020-07-27 ENCOUNTER — Telehealth: Payer: Self-pay | Admitting: Cardiology

## 2020-07-27 VITALS — BP 160/88 | Ht 67.0 in | Wt 207.0 lb

## 2020-07-27 DIAGNOSIS — H1010 Acute atopic conjunctivitis, unspecified eye: Secondary | ICD-10-CM | POA: Diagnosis not present

## 2020-07-27 DIAGNOSIS — E119 Type 2 diabetes mellitus without complications: Secondary | ICD-10-CM | POA: Diagnosis not present

## 2020-07-27 DIAGNOSIS — R600 Localized edema: Secondary | ICD-10-CM

## 2020-07-27 DIAGNOSIS — R0602 Shortness of breath: Secondary | ICD-10-CM

## 2020-07-27 DIAGNOSIS — F419 Anxiety disorder, unspecified: Secondary | ICD-10-CM | POA: Diagnosis not present

## 2020-07-27 DIAGNOSIS — R42 Dizziness and giddiness: Secondary | ICD-10-CM | POA: Diagnosis not present

## 2020-07-27 DIAGNOSIS — I10 Essential (primary) hypertension: Secondary | ICD-10-CM | POA: Diagnosis not present

## 2020-07-27 DIAGNOSIS — I4819 Other persistent atrial fibrillation: Secondary | ICD-10-CM

## 2020-07-27 MED ORDER — METOLAZONE 2.5 MG PO TABS: 2.5000 mg | ORAL_TABLET | Freq: Every day | ORAL | 0 refills | Status: DC

## 2020-07-27 MED ORDER — POTASSIUM CHLORIDE CRYS ER 20 MEQ PO TBCR: 20.0000 meq | EXTENDED_RELEASE_TABLET | Freq: Two times a day (BID) | ORAL | 1 refills | Status: DC

## 2020-07-27 MED ORDER — FUROSEMIDE 40 MG PO TABS: 40.0000 mg | ORAL_TABLET | Freq: Two times a day (BID) | ORAL | 1 refills | Status: DC

## 2020-07-27 NOTE — Telephone Encounter (Signed)
    STAT if patient feels like he/she is going to faint   1) Are you dizzy now? No  2) Do you feel faint or have you passed out? No  3) Do you have any other symptoms? SOB  4) Have you checked your HR and BP (record if available)? Per pt BP and HR are good  Pt went to his family doctor today and he is complaining dizziness and SOB. He said he gets dizzy every time he stands up and SOB when he lays down and bend down. He said he started feeling this after his procedure. Per his family doctor he needs to see Dr. Harriet Masson sooner

## 2020-07-27 NOTE — Progress Notes (Signed)
Cardiology Office Note:    Date:  07/27/2020   ID:  Logan [name], DOB 1945/05/10, MRN [id number]  PCP:  [name], MD  Cardiologist:  Berniece Salines, DO  Electrophysiologist:  Will Meredith Leeds, MD   Referring MD: [name], MD   My leg is swelling more than normal  History of Present Illness:    Logan [name] is a 75 y.o. male with a hx of atrial fibrillation on Xarelto and carvedilol, hypertension, hyperlipidemia, prediabetes and morbid obesity.  I saw the patientinAugust 2, 2021 at that time he was status post cardioversion however he was shocked 3 times with 200 J but unfortunately patient did not convert into sinus rhythm. At his last visit I started the patient amiodarone hoping to get him converted to sinus rhythm. During that time he was planning colonoscopy I did asked the patient to please wait until after August 20 which should been a good 4 weeks after his procedure.  The patient was seen in theSeptember 2, 2021 at that time he still was in atrial fibrillation despite amiodarone.  He underwent EP ablation in November 2021 and appears to be doing well he was seen in the EP clinic in December 20, at that time he appeared to be in sinus rhythm discontinue his anticoagulation his beta-blocker.  I saw the patient on 06/19/2020 and he did see EP that same day. His Amiodarone was increased. He was scheduled for a synchronized DCCV as well by the EP team. The patient presented to [facility] on 07/19/2020 for his DCCV. He underwent synchronized DCCV x2 shock with 200J which was unsuccessful - without conversion to sinus rhythm. His procedure was complicated by transient hypotension requiring ephedrine. He was continued on his amiodarone.   The patient is here today because he requested to be seen. He tells me that over the last several days he has had worsening leg edema with his baseline shortness of breath. He is here with his fiance.  Past Medical  History:  Diagnosis Date  . Anxiety   . Atherosclerotic renal artery stenosis, unilateral ([place]) 02/28/2014  . Atrial fibrillation ([place])   . Bilateral lower extremity edema   . Chronic diastolic heart failure ([place]) 10/21/2019  . Diabetes mellitus without complication ([place])   . DVT (deep venous thrombosis) ([place])   . Esophageal reflux   . Essential hypertension 10/21/2019  . Gout   . Hyperlipidemia   . Hypertension   . Mixed hyperlipidemia 10/21/2019  . Morbid obesity ([place])   . Obesity (BMI 30-39.9) 08/24/2019  . Orthopnea   . Osteoarthritis   . Persistent atrial fibrillation ([place]) 10/21/2019  . Seasonal allergies   . Sleep apnea     Past Surgical History:  Procedure Laterality Date  . ABDOMINAL AORTAGRAM N/A 02/28/2014   Procedure: ABDOMINAL AORTAGRAM;  Surgeon: Serafina Mitchell, MD;  Location: [facility] CATH LAB;  Service: Cardiovascular;  Laterality: N/A;  . APPENDECTOMY    . ATRIAL FIBRILLATION ABLATION N/A 02/29/2020   Procedure: ATRIAL FIBRILLATION ABLATION;  Surgeon: Constance Haw, MD;  Location: [facility] CV LAB;  Service: Cardiovascular;  Laterality: N/A;  . CARPAL TUNNEL RELEASE Left     Current Medications: Current Meds  Medication Sig  . allopurinol (ZYLOPRIM) 300 MG tablet Take 300 mg by mouth daily.  Marland Kitchen amiodarone (PACERONE) 200 MG tablet Take 1 tablet (200 mg total) by mouth daily.  Marland Kitchen apixaban (ELIQUIS) 5 MG TABS tablet Take 1 tablet (5 mg total) by mouth 2 (two) times daily.  Marland Kitchen  carvedilol (COREG) 12.5 MG tablet Take 12.5 mg by mouth 2 (two) times daily with a meal.  . DHEA 25 MG CAPS Take 25 mg by mouth daily.  Marland Kitchen doxazosin (CARDURA) 8 MG tablet Take 8 mg by mouth daily.   . fluticasone (FLONASE) 50 MCG/ACT nasal spray Place 1 spray into both nostrils daily as needed for rhinitis.   . furosemide (LASIX) 40 MG tablet Take 1 tablet (40 mg total) by mouth 2 (two) times daily.  Marland Kitchen ketoconazole (NIZORAL) 2 % cream Apply 1 application topically daily as needed for irritation.    Marland Kitchen loratadine (CLARITIN) 10 MG tablet Take 10 mg by mouth daily as needed for allergies.  Marland Kitchen losartan (COZAAR) 100 MG tablet Take 100 mg by mouth daily.  . meloxicam (MOBIC) 7.5 MG tablet Take 7.5 mg by mouth 2 (two) times daily as needed.   . metolazone (ZAROXOLYN) 2.5 MG tablet Take 1 tablet (2.5 mg total) by mouth daily. Take 30 minutes prior to taking Furosemide  . omeprazole (PRILOSEC) 20 MG capsule Take 20 mg by mouth daily as needed (for heartburn).   . potassium chloride SA (KLOR-CON) 20 MEQ tablet Take 1 tablet (20 mEq total) by mouth 2 (two) times daily.  . pravastatin (PRAVACHOL) 20 MG tablet Take 20 mg by mouth daily.   . traMADol (ULTRAM) 50 MG tablet Take 50 mg by mouth daily as needed for moderate pain.   . vitamin B-12 (CYANOCOBALAMIN) 1000 MCG tablet Take 1,000 mcg by mouth daily.  . Vitamin D, Ergocalciferol, (DRISDOL) 1.25 MG (50000 UNIT) CAPS capsule Take 1 capsule every 7 days. (Take one capsule weekly for 12 weeks)  . [DISCONTINUED] Cholecalciferol (VITAMIN D-3) 1000 UNITS CAPS Take 1,000 Units by mouth daily.  . [DISCONTINUED] furosemide (LASIX) 40 MG tablet Take 1 tablet (40 mg total) by mouth daily.  . [DISCONTINUED] hydrALAZINE (APRESOLINE) 25 MG tablet Take 25 mg by mouth in the morning and at bedtime.   . [DISCONTINUED] potassium chloride SA (KLOR-CON) 20 MEQ tablet Take 1 tablet (20 mEq total) by mouth daily.     Allergies:   Patient has no known allergies.   Social History   Socioeconomic History  . Marital status: Widowed    Spouse name: Not on file  . Number of children: Not on file  . Years of education: Not on file  . Highest education level: Not on file  Occupational History  . Not on file  Tobacco Use  . Smoking status: Former Smoker    Quit date: 02/07/1997    Years since quitting: 23.4  . Smokeless tobacco: Current User    Types: Chew  Substance and Sexual Activity  . Alcohol use: Never  . Drug use: Never  . Sexual activity: Not on file   Other Topics Concern  . Not on file  Social History Narrative  . Not on file   Social Determinants of Health   Financial Resource Strain: Not on file  Food Insecurity: Not on file  Transportation Needs: Not on file  Physical Activity: Not on file  Stress: Not on file  Social Connections: Not on file     Family History: The patient's family history includes Cancer in his father; Heart disease in his mother.  ROS:   Review of Systems  Constitution: Negative for decreased appetite, fever and weight gain.  HENT: Negative for congestion, ear discharge, hoarse voice and sore throat.   Eyes: Negative for discharge, redness, vision loss in right eye and visual halos.  Cardiovascular: reports bilateral leg swelling. Negative for chest pain, dyspnea on exertion, leg swelling, orthopnea.  Respiratory: Negative for cough, hemoptysis, shortness of breath and snoring.   Endocrine: Negative for heat intolerance and polyphagia.  Hematologic/Lymphatic: Negative for bleeding problem. Does not bruise/bleed easily.  Skin: Negative for flushing, nail changes, rash and suspicious lesions.  Musculoskeletal: Negative for arthritis, joint pain, muscle cramps, myalgias, neck pain and stiffness.  Gastrointestinal: Negative for abdominal pain, bowel incontinence, diarrhea and excessive appetite.  Genitourinary: Negative for decreased libido, genital sores and incomplete emptying.  Neurological: Negative for brief paralysis, focal weakness, headaches and loss of balance.  Psychiatric/Behavioral: Negative for altered mental status, depression and suicidal ideas.  Allergic/Immunologic: Negative for HIV exposure and persistent infections.    EKGs/Labs/Other Studies Reviewed:    The following studies were reviewed today:   EKG:  The ekg ordered today demonstrates atrial fibrillation with controlled ventricular rate, HR 65 bpm.   Recent Labs: 11/21/2019: ALT 14 05/21/2020: TSH 3.010 06/19/2020: BUN 23;  Creatinine, Ser 1.06; Hemoglobin 13.7; Magnesium 1.7; Platelets 237; Potassium 4.2; Sodium 142  Recent Lipid Panel No results found for: CHOL, TRIG, HDL, CHOLHDL, VLDL, LDLCALC, LDLDIRECT  Physical Exam:    VS:  BP (!) 160/88   Ht 5\' 7"  (1.702 m)   Wt 207 lb (93.9 kg)   SpO2 95%   BMI 32.42 kg/m     Wt Readings from Last 3 Encounters:  07/27/20 207 lb (93.9 kg)  06/19/20 207 lb 6.4 oz (94.1 kg)  06/18/20 206 lb (93.4 kg)     GEN: Well nourished, well developed in no acute distress HEENT: Normal NECK: No JVD; No carotid bruits LYMPHATICS: No lymphadenopathy CARDIAC: S1S2 noted,RRR, no murmurs, rubs, gallops RESPIRATORY:  Clear to auscultation without rales, wheezing or rhonchi  ABDOMEN: Soft, non-tender, non-distended, +bowel sounds, no guarding. EXTREMITIES: +3 bilateral lower edema, No cyanosis, no clubbing MUSCULOSKELETAL:  No deformity  SKIN: Warm and dry NEUROLOGIC:  Alert and oriented x 3, non-focal PSYCHIATRIC:  Normal affect, good insight  ASSESSMENT:    1. Bilateral leg edema   2. Shortness of breath   3. Persistent atrial fibrillation ([place])   4. Primary hypertension   5. Diabetes mellitus without complication ([place])    PLAN:    His physical exam is suggesting volume overload - I will increase his Lasix to 40 mg twice daily along with potassium supplement. I will give him metolazone 2.5 mg 30 minutes prior to his Lasix dose of the day ( for the next three days). He declines blood work because he did see his pcp today who did blood work. We will request blood work results. If he does not improve by his next visit, we will also get a limited echo.  He is still in atrial fibrillation with controlled rate.  We will continue his current regimen for rate control and anticoagulation.  This is being managed by his primary care doctor.  No adjustments for antidiabetic medications were made today.  He is hypertensive in the office today hopefully the increase in Lasix  will also help with his blood pressure.  His shortness of breath could be multifactorial but we will see his response to diuretic for now.  The patient is in agreement with the above plan. The patient left the office in stable condition.  The patient will follow up 1 week.   Medication Adjustments/Labs and Tests Ordered: Current medicines are reviewed at length with the patient today.  Concerns regarding medicines are outlined  above.  Orders Placed This Encounter  Procedures  . EKG 12-Lead   Meds ordered this encounter  Medications  . metolazone (ZAROXOLYN) 2.5 MG tablet    Sig: Take 1 tablet (2.5 mg total) by mouth daily. Take 30 minutes prior to taking Furosemide    Dispense:  3 tablet    Refill:  0  . furosemide (LASIX) 40 MG tablet    Sig: Take 1 tablet (40 mg total) by mouth 2 (two) times daily.    Dispense:  180 tablet    Refill:  1  . potassium chloride SA (KLOR-CON) 20 MEQ tablet    Sig: Take 1 tablet (20 mEq total) by mouth 2 (two) times daily.    Dispense:  180 tablet    Refill:  1    Patient Instructions  Medication Instructions:  Your physician has recommended you make the following change in your medication:  1. TAKE METOLAZONE 2.5MG  FOR 3 DOSES ONLY, TAKE 30 MINUTES BEFORE FUROSEMIDE 2. TAKE FUROSEMIDE 40MG  2 TIMES DAILY 3. TAKE POTASSIUM 20MEQ 2 TIMES DAILY   *If you need a refill on your cardiac medications before your next appointment, please call your pharmacy*   Lab Work: NONE If you have labs (blood work) drawn today and your tests are completely normal, you will receive your results only by: Marland Kitchen MyChart Message (if you have MyChart) OR . A paper copy in the mail If you have any lab test that is abnormal or we need to change your treatment, we will call you to review the results.   Testing/Procedures: EKG   Follow-Up: At [facility], you and your health needs are our priority.  As part of our continuing mission to provide you with exceptional  heart care, we have created designated Provider Care Teams.  These Care Teams include your primary Cardiologist (physician) and Advanced Practice Providers (APPs -  Physician Assistants and Nurse Practitioners) who all work together to provide you with the care you need, when you need it.  We recommend signing up for the patient portal called "MyChart".  Sign up information is provided on this After Visit Summary.  MyChart is used to connect with patients for Virtual Visits (Telemedicine).  Patients are able to view lab/test results, encounter notes, upcoming appointments, etc.  Non-urgent messages can be sent to your provider as well.   To learn more about what you can do with MyChart, go to [email or web address].    Your next appointment:   Keep your appointment for 08/02/20  The format for your next appointment:   In Person  Provider:   Berniece Salines, DO   Other Instructions      Adopting a Healthy Lifestyle.  Know what a healthy weight is for you (roughly BMI <25) and aim to maintain this   Aim for 7+ servings of fruits and vegetables daily   65-80+ fluid ounces of water or unsweet tea for healthy kidneys   Limit to max 1 drink of alcohol per day; avoid smoking/tobacco   Limit animal fats in diet for cholesterol and heart health - choose grass fed whenever available   Avoid highly processed foods, and foods high in saturated/trans fats   Aim for low stress - take time to unwind and care for your mental health   Aim for 150 min of moderate intensity exercise weekly for heart health, and weights twice weekly for bone health   Aim for 7-9 hours of sleep daily   When it comes to diets, agreement  about the perfect plan isnt easy to find, even among the experts. Experts at the [employer] developed an idea known as the Healthy Eating Plate. Just imagine a plate divided into logical, healthy portions.   The emphasis is on diet quality:   Load up on  vegetables and fruits - one-half of your plate: Aim for color and variety, and remember that potatoes dont count.   Go for whole grains - one-quarter of your plate: Whole wheat, barley, wheat berries, quinoa, oats, brown rice, and foods made with them. If you want pasta, go with whole wheat pasta.   Protein power - one-quarter of your plate: Fish, chicken, beans, and nuts are all healthy, versatile protein sources. Limit red meat.   The diet, however, does go beyond the plate, offering a few other suggestions.   Use healthy plant oils, such as olive, canola, soy, corn, sunflower and peanut. Check the labels, and avoid partially hydrogenated oil, which have unhealthy trans fats.   If youre thirsty, drink water. Coffee and tea are good in moderation, but skip sugary drinks and limit milk and dairy products to one or two daily servings.   The type of carbohydrate in the diet is more important than the amount. Some sources of carbohydrates, such as vegetables, fruits, whole grains, and beans-are healthier than others.   Finally, stay active  Signed, Berniece Salines, DO  07/27/2020 10:58 PM    [facility]

## 2020-07-27 NOTE — Telephone Encounter (Signed)
Please see if we can bring him in today.  That way we can get labs and EKG.

## 2020-07-27 NOTE — Telephone Encounter (Signed)
Spoke to the patient just now and he let me know that he has been getting dizzy when standing up and SOB when bending over or laying down. He does not feel like he will pass out at this time. His family doctor advised him to call Dr. Harriet Masson to see if he could get in sooner. He states that he has been feeling like this since after his cardioversion. This morning his BP was 130/70 and HR 95 bpm this was taken at his PCP's office. He states that they only took his blood pressure once and they did not take orthostatics.   He is scheduled to see Dr. Harriet Masson next week.   His medication list is up to date per our records/the patient.

## 2020-07-27 NOTE — Telephone Encounter (Signed)
Patient seen today

## 2020-07-27 NOTE — Patient Instructions (Signed)
Medication Instructions:  Your physician has recommended you make the following change in your medication:  1. TAKE METOLAZONE 2.5MG  FOR 3 DOSES ONLY, TAKE 30 MINUTES BEFORE FUROSEMIDE 2. TAKE FUROSEMIDE 40MG  2 TIMES DAILY 3. TAKE POTASSIUM 20MEQ 2 TIMES DAILY   *If you need a refill on your cardiac medications before your next appointment, please call your pharmacy*   Lab Work: NONE If you have labs (blood work) drawn today and your tests are completely normal, you will receive your results only by: Marland Kitchen MyChart Message (if you have MyChart) OR . A paper copy in the mail If you have any lab test that is abnormal or we need to change your treatment, we will call you to review the results.   Testing/Procedures: EKG   Follow-Up: At [facility], you and your health needs are our priority.  As part of our continuing mission to provide you with exceptional heart care, we have created designated Provider Care Teams.  These Care Teams include your primary Cardiologist (physician) and Advanced Practice Providers (APPs -  Physician Assistants and Nurse Practitioners) who all work together to provide you with the care you need, when you need it.  We recommend signing up for the patient portal called "MyChart".  Sign up information is provided on this After Visit Summary.  MyChart is used to connect with patients for Virtual Visits (Telemedicine).  Patients are able to view lab/test results, encounter notes, upcoming appointments, etc.  Non-urgent messages can be sent to your provider as well.   To learn more about what you can do with MyChart, go to [email or web address].    Your next appointment:   Keep your appointment for 08/02/20  The format for your next appointment:   In Person  Provider:   Berniece Salines, DO   Other Instructions

## 2020-07-31 ENCOUNTER — Telehealth: Payer: Self-pay

## 2020-07-31 DIAGNOSIS — E8779 Other fluid overload: Secondary | ICD-10-CM | POA: Diagnosis not present

## 2020-07-31 NOTE — Telephone Encounter (Signed)
Lab work requested from PCP. 

## 2020-08-02 ENCOUNTER — Ambulatory Visit: Payer: Medicare HMO | Admitting: Cardiology

## 2020-08-02 ENCOUNTER — Encounter: Payer: Self-pay | Admitting: Cardiology

## 2020-08-02 ENCOUNTER — Other Ambulatory Visit: Payer: Self-pay

## 2020-08-02 VITALS — BP 110/62 | HR 88 | Ht 67.0 in | Wt 195.2 lb

## 2020-08-02 DIAGNOSIS — E669 Obesity, unspecified: Secondary | ICD-10-CM | POA: Diagnosis not present

## 2020-08-02 DIAGNOSIS — I480 Paroxysmal atrial fibrillation: Secondary | ICD-10-CM | POA: Diagnosis not present

## 2020-08-02 DIAGNOSIS — I5032 Chronic diastolic (congestive) heart failure: Secondary | ICD-10-CM

## 2020-08-02 DIAGNOSIS — E782 Mixed hyperlipidemia: Secondary | ICD-10-CM | POA: Diagnosis not present

## 2020-08-02 DIAGNOSIS — I10 Essential (primary) hypertension: Secondary | ICD-10-CM | POA: Diagnosis not present

## 2020-08-02 MED ORDER — FUROSEMIDE 40 MG PO TABS: 40.0000 mg | ORAL_TABLET | Freq: Every day | ORAL | 3 refills | Status: DC

## 2020-08-02 MED ORDER — POTASSIUM CHLORIDE CRYS ER 20 MEQ PO TBCR: 20.0000 meq | EXTENDED_RELEASE_TABLET | Freq: Every day | ORAL | 3 refills | Status: DC

## 2020-08-02 NOTE — Patient Instructions (Signed)
Medication Instructions:  Your physician has recommended you make the following change in your medication: START: Lasix 40 mg once daily START: Potassium 20 meq once daily *If you need a refill on your cardiac medications before your next appointment, please call your pharmacy*   Lab Work: None If you have labs (blood work) drawn today and your tests are completely normal, you will receive your results only by: Marland Kitchen MyChart Message (if you have MyChart) OR . A paper copy in the mail If you have any lab test that is abnormal or we need to change your treatment, we will call you to review the results.   Testing/Procedures: None   Follow-Up: At [facility], you and your health needs are our priority.  As part of our continuing mission to provide you with exceptional heart care, we have created designated Provider Care Teams.  These Care Teams include your primary Cardiologist (physician) and Advanced Practice Providers (APPs -  Physician Assistants and Nurse Practitioners) who all work together to provide you with the care you need, when you need it.  We recommend signing up for the patient portal called "MyChart".  Sign up information is provided on this After Visit Summary.  MyChart is used to connect with patients for Virtual Visits (Telemedicine).  Patients are able to view lab/test results, encounter notes, upcoming appointments, etc.  Non-urgent messages can be sent to your provider as well.   To learn more about what you can do with MyChart, go to [email or web address].    Your next appointment:   3 month(s)  The format for your next appointment:   In Person  Provider:   Berniece Salines, DO   Other Instructions

## 2020-08-02 NOTE — Progress Notes (Signed)
ekg 

## 2020-08-03 NOTE — Progress Notes (Signed)
Cardiology Office Note:    Date:  08/03/2020   ID:  Logan [name], DOB 1946-01-14, MRN [id number]  PCP:  [name], MD  Cardiologist:  Berniece Salines, DO  Electrophysiologist:  Will Meredith Leeds, MD   Referring MD: [name], MD   I feel a lot better all the fluids are gone  History of Present Illness:    Logan [name] is a 75 y.o. male with a  hx of atrial fibrillation on Xarelto and carvedilol, hypertension, hyperlipidemia, prediabetes and morbid obesity.  I saw the patientinAugust 2, 2021 at that time he was status post cardioversion however he was shocked 3 times with 200 J but unfortunately patient did not convert into sinus rhythm. At his last visit I started the patient amiodarone hoping to get him converted to sinus rhythm. During that time he was planning colonoscopy I did asked the patient to please wait until after August 20 which should been a good 4 weeks after his procedure.  The patient was seen in theSeptember 2, 2021 at that time he still was in atrial fibrillation despite amiodarone.  He underwent EP ablation in November 2021 and appears to be doing well he was seen in the EP clinic in December 20, at that time he appeared to be in sinus rhythm discontinue his anticoagulation his beta-blocker.  I saw the patient on 06/19/2020 and he did see EP that same day. His Amiodarone was increased. He was scheduled for a synchronized DCCV as well by the EP team. The patient presented to [facility] on 07/19/2020 for his DCCV. He underwent synchronized DCCV x2 shock with 200J which was unsuccessful - without conversion to sinus rhythm. His procedure was complicated by transient hypotension requiring ephedrine. He was continued on his amiodarone.   The patient requested to be seen in July 27, 2020 at that time he did have significant fluid in his lower extremity as well as bibasilar.  I gave the patient metolazone 2.5 mg for 3 days as well as increase his  Lasix to 40 mg twice a day.    He is here today for follow-up visit.  He is very happy because he feels a lot better and has had significant improvement since optimizing his diuretics.   Past Medical History:  Diagnosis Date  . Anxiety   . Atherosclerotic renal artery stenosis, unilateral ([place]) 02/28/2014  . Atrial fibrillation ([place])   . Bilateral lower extremity edema   . Chronic diastolic heart failure ([place]) 10/21/2019  . Diabetes mellitus without complication ([place])   . DVT (deep venous thrombosis) ([place])   . Esophageal reflux   . Essential hypertension 10/21/2019  . Gout   . Hyperlipidemia   . Hypertension   . Mixed hyperlipidemia 10/21/2019  . Morbid obesity ([place])   . Obesity (BMI 30-39.9) 08/24/2019  . Orthopnea   . Osteoarthritis   . Persistent atrial fibrillation ([place]) 10/21/2019  . Seasonal allergies   . Sleep apnea     Past Surgical History:  Procedure Laterality Date  . ABDOMINAL AORTAGRAM N/A 02/28/2014   Procedure: ABDOMINAL AORTAGRAM;  Surgeon: Serafina Mitchell, MD;  Location: [facility] CATH LAB;  Service: Cardiovascular;  Laterality: N/A;  . APPENDECTOMY    . ATRIAL FIBRILLATION ABLATION N/A 02/29/2020   Procedure: ATRIAL FIBRILLATION ABLATION;  Surgeon: Constance Haw, MD;  Location: [facility] CV LAB;  Service: Cardiovascular;  Laterality: N/A;  . CARPAL TUNNEL RELEASE Left     Current Medications: Current Meds  Medication Sig  . allopurinol (  ZYLOPRIM) 300 MG tablet Take 300 mg by mouth daily.  Marland Kitchen amiodarone (PACERONE) 200 MG tablet Take 1 tablet (200 mg total) by mouth daily.  Marland Kitchen apixaban (ELIQUIS) 5 MG TABS tablet Take 1 tablet (5 mg total) by mouth 2 (two) times daily.  . carvedilol (COREG) 12.5 MG tablet Take 12.5 mg by mouth 2 (two) times daily with a meal.  . DHEA 25 MG CAPS Take 25 mg by mouth daily.  Marland Kitchen doxazosin (CARDURA) 8 MG tablet Take 8 mg by mouth daily.   . fluticasone (FLONASE) 50 MCG/ACT nasal spray Place 1 spray into both nostrils daily as needed for  rhinitis.   . furosemide (LASIX) 40 MG tablet Take 1 tablet (40 mg total) by mouth daily.  Marland Kitchen ketoconazole (NIZORAL) 2 % cream Apply 1 application topically daily as needed for irritation.   Marland Kitchen loratadine (CLARITIN) 10 MG tablet Take 10 mg by mouth daily as needed for allergies.  Marland Kitchen losartan (COZAAR) 100 MG tablet Take 100 mg by mouth daily.  . meloxicam (MOBIC) 7.5 MG tablet Take 7.5 mg by mouth 2 (two) times daily as needed.   Marland Kitchen omeprazole (PRILOSEC) 20 MG capsule Take 20 mg by mouth daily as needed (for heartburn).   . potassium chloride SA (KLOR-CON) 20 MEQ tablet Take 1 tablet (20 mEq total) by mouth daily.  . pravastatin (PRAVACHOL) 20 MG tablet Take 20 mg by mouth daily.   . traMADol (ULTRAM) 50 MG tablet Take 50 mg by mouth daily as needed for moderate pain.   . vitamin B-12 (CYANOCOBALAMIN) 1000 MCG tablet Take 1,000 mcg by mouth daily.  . Vitamin D, Ergocalciferol, (DRISDOL) 1.25 MG (50000 UNIT) CAPS capsule Take 1 capsule every 7 days. (Take one capsule weekly for 12 weeks)  . [DISCONTINUED] furosemide (LASIX) 40 MG tablet Take 1 tablet (40 mg total) by mouth 2 (two) times daily.  . [DISCONTINUED] metolazone (ZAROXOLYN) 2.5 MG tablet Take 1 tablet (2.5 mg total) by mouth daily. Take 30 minutes prior to taking Furosemide  . [DISCONTINUED] potassium chloride SA (KLOR-CON) 20 MEQ tablet Take 1 tablet (20 mEq total) by mouth 2 (two) times daily.     Allergies:   Patient has no known allergies.   Social History   Socioeconomic History  . Marital status: Widowed    Spouse name: Not on file  . Number of children: Not on file  . Years of education: Not on file  . Highest education level: Not on file  Occupational History  . Not on file  Tobacco Use  . Smoking status: Former Smoker    Quit date: 02/07/1997    Years since quitting: 23.5  . Smokeless tobacco: Current User    Types: Chew  Substance and Sexual Activity  . Alcohol use: Never  . Drug use: Never  . Sexual activity: Not  on file  Other Topics Concern  . Not on file  Social History Narrative  . Not on file   Social Determinants of Health   Financial Resource Strain: Not on file  Food Insecurity: Not on file  Transportation Needs: Not on file  Physical Activity: Not on file  Stress: Not on file  Social Connections: Not on file     Family History: The patient's family history includes Cancer in his father; Heart disease in his mother.  ROS:   Review of Systems  Constitution: Negative for decreased appetite, fever and weight gain.  HENT: Negative for congestion, ear discharge, hoarse voice and sore throat.  Eyes: Negative for discharge, redness, vision loss in right eye and visual halos.  Cardiovascular: Negative for chest pain, dyspnea on exertion, leg swelling, orthopnea and palpitations.  Respiratory: Negative for cough, hemoptysis, shortness of breath and snoring.   Endocrine: Negative for heat intolerance and polyphagia.  Hematologic/Lymphatic: Negative for bleeding problem. Does not bruise/bleed easily.  Skin: Negative for flushing, nail changes, rash and suspicious lesions.  Musculoskeletal: Negative for arthritis, joint pain, muscle cramps, myalgias, neck pain and stiffness.  Gastrointestinal: Negative for abdominal pain, bowel incontinence, diarrhea and excessive appetite.  Genitourinary: Negative for decreased libido, genital sores and incomplete emptying.  Neurological: Negative for brief paralysis, focal weakness, headaches and loss of balance.  Psychiatric/Behavioral: Negative for altered mental status, depression and suicidal ideas.  Allergic/Immunologic: Negative for HIV exposure and persistent infections.    EKGs/Labs/Other Studies Reviewed:    The following studies were reviewed today:   EKG:  The ekg ordered today demonstrates atrial fibrillation, controlled ventricular rate heart rate 70 bpm  Recent Labs: 11/21/2019: ALT 14 05/21/2020: TSH 3.010 06/19/2020: BUN 23;  Creatinine, Ser 1.06; Hemoglobin 13.7; Magnesium 1.7; Platelets 237; Potassium 4.2; Sodium 142  He did have blood work at his PCP office yesterday which showed sodium 149, potassium 4.5, chloride 98, bicarb 28, BUN 22, creatinine (this is not clear is either 1.34 or 1.84), glucose 154 Recent Lipid Panel No results found for: CHOL, TRIG, HDL, CHOLHDL, VLDL, LDLCALC, LDLDIRECT  Physical Exam:    VS:  BP 110/62   Pulse 88   Ht 5\' 7"  (1.702 m)   Wt 195 lb 3.2 oz (88.5 kg)   SpO2 95%   BMI 30.57 kg/m     Wt Readings from Last 3 Encounters:  08/02/20 195 lb 3.2 oz (88.5 kg)  07/27/20 207 lb (93.9 kg)  06/19/20 207 lb 6.4 oz (94.1 kg)     GEN: Well nourished, well developed in no acute distress HEENT: Normal NECK: No JVD; No carotid bruits LYMPHATICS: No lymphadenopathy CARDIAC: S1S2 noted,RRR, no murmurs, rubs, gallops RESPIRATORY:  Clear to auscultation without rales, wheezing or rhonchi  ABDOMEN: Soft, non-tender, non-distended, +bowel sounds, no guarding. EXTREMITIES: No edema, No cyanosis, no clubbing MUSCULOSKELETAL:  No deformity  SKIN: Warm and dry NEUROLOGIC:  Alert and oriented x 3, non-focal PSYCHIATRIC:  Normal affect, good insight  ASSESSMENT:    1. PAF (paroxysmal atrial fibrillation) ([place])   2. Hypertension, unspecified type   3. Chronic diastolic heart failure (HCC)   4. Obesity (BMI 30-39.9)   5. Mixed hyperlipidemia    PLAN:     1.  He has had tremendous improvement with his optimization of diuretics.  On April 8 he was 207 today he is 195 pounds.  We will consider 195 pounds to be his dry weight.  I have educated patient about this.  We will get a go back down to his Lasix to 40 mg daily.  He will weigh himself daily as well. 2.  He did request EKG he has seen atrial fibrillation today. 3.  Blood pressure is acceptable 4.  The patient understands the need to lose weight with diet and exercise. We have discussed specific strategies for this.  5.  He had  deferred sleep study in [place] as he does not want to travel.  We will refer him to pulmonary here in [place] hopefully he can get a sleep study done with them.   The patient is in agreement with the above plan. The patient left the office in  stable condition.  The patient will follow up in 3 months or sooner if needed.   Medication Adjustments/Labs and Tests Ordered: Current medicines are reviewed at length with the patient today.  Concerns regarding medicines are outlined above.  Orders Placed This Encounter  Procedures  . EKG 12-Lead   Meds ordered this encounter  Medications  . furosemide (LASIX) 40 MG tablet    Sig: Take 1 tablet (40 mg total) by mouth daily.    Dispense:  90 tablet    Refill:  3  . potassium chloride SA (KLOR-CON) 20 MEQ tablet    Sig: Take 1 tablet (20 mEq total) by mouth daily.    Dispense:  90 tablet    Refill:  3    Patient Instructions  Medication Instructions:  Your physician has recommended you make the following change in your medication: START: Lasix 40 mg once daily START: Potassium 20 meq once daily *If you need a refill on your cardiac medications before your next appointment, please call your pharmacy*   Lab Work: None If you have labs (blood work) drawn today and your tests are completely normal, you will receive your results only by: Marland Kitchen MyChart Message (if you have MyChart) OR . A paper copy in the mail If you have any lab test that is abnormal or we need to change your treatment, we will call you to review the results.   Testing/Procedures: None   Follow-Up: At [facility], you and your health needs are our priority.  As part of our continuing mission to provide you with exceptional heart care, we have created designated Provider Care Teams.  These Care Teams include your primary Cardiologist (physician) and Advanced Practice Providers (APPs -  Physician Assistants and Nurse Practitioners) who all work together to provide you  with the care you need, when you need it.  We recommend signing up for the patient portal called "MyChart".  Sign up information is provided on this After Visit Summary.  MyChart is used to connect with patients for Virtual Visits (Telemedicine).  Patients are able to view lab/test results, encounter notes, upcoming appointments, etc.  Non-urgent messages can be sent to your provider as well.   To learn more about what you can do with MyChart, go to [email or web address].    Your next appointment:   3 month(s)  The format for your next appointment:   In Person  Provider:   Berniece Salines, DO   Other Instructions      Adopting a Healthy Lifestyle.  Know what a healthy weight is for you (roughly BMI <25) and aim to maintain this   Aim for 7+ servings of fruits and vegetables daily   65-80+ fluid ounces of water or unsweet tea for healthy kidneys   Limit to max 1 drink of alcohol per day; avoid smoking/tobacco   Limit animal fats in diet for cholesterol and heart health - choose grass fed whenever available   Avoid highly processed foods, and foods high in saturated/trans fats   Aim for low stress - take time to unwind and care for your mental health   Aim for 150 min of moderate intensity exercise weekly for heart health, and weights twice weekly for bone health   Aim for 7-9 hours of sleep daily   When it comes to diets, agreement about the perfect plan isnt easy to find, even among the experts. Experts at the [employer] developed an idea known as the Healthy Eating Plate.  Just imagine a plate divided into logical, healthy portions.   The emphasis is on diet quality:   Load up on vegetables and fruits - one-half of your plate: Aim for color and variety, and remember that potatoes dont count.   Go for whole grains - one-quarter of your plate: Whole wheat, barley, wheat berries, quinoa, oats, brown rice, and foods made with them. If you want pasta, go  with whole wheat pasta.   Protein power - one-quarter of your plate: Fish, chicken, beans, and nuts are all healthy, versatile protein sources. Limit red meat.   The diet, however, does go beyond the plate, offering a few other suggestions.   Use healthy plant oils, such as olive, canola, soy, corn, sunflower and peanut. Check the labels, and avoid partially hydrogenated oil, which have unhealthy trans fats.   If youre thirsty, drink water. Coffee and tea are good in moderation, but skip sugary drinks and limit milk and dairy products to one or two daily servings.   The type of carbohydrate in the diet is more important than the amount. Some sources of carbohydrates, such as vegetables, fruits, whole grains, and beans-are healthier than others.   Finally, stay active  Signed, Berniece Salines, DO  08/03/2020 8:09 AM    [place]

## 2020-08-18 DIAGNOSIS — E1169 Type 2 diabetes mellitus with other specified complication: Secondary | ICD-10-CM | POA: Diagnosis not present

## 2020-08-18 DIAGNOSIS — I10 Essential (primary) hypertension: Secondary | ICD-10-CM | POA: Diagnosis not present

## 2020-08-18 DIAGNOSIS — G4733 Obstructive sleep apnea (adult) (pediatric): Secondary | ICD-10-CM | POA: Diagnosis not present

## 2020-08-18 DIAGNOSIS — E785 Hyperlipidemia, unspecified: Secondary | ICD-10-CM | POA: Diagnosis not present

## 2020-09-18 DIAGNOSIS — E1169 Type 2 diabetes mellitus with other specified complication: Secondary | ICD-10-CM | POA: Diagnosis not present

## 2020-09-18 DIAGNOSIS — I10 Essential (primary) hypertension: Secondary | ICD-10-CM | POA: Diagnosis not present

## 2020-09-18 DIAGNOSIS — E785 Hyperlipidemia, unspecified: Secondary | ICD-10-CM | POA: Diagnosis not present

## 2020-09-20 ENCOUNTER — Other Ambulatory Visit: Payer: Self-pay | Admitting: Cardiology

## 2020-09-20 NOTE — Telephone Encounter (Signed)
Age 75, weight 88.5kg, SCr 1.05 on 07/27/20, afib indication, last visit 08/02/20

## 2020-09-24 ENCOUNTER — Telehealth: Payer: Self-pay | Admitting: Cardiology

## 2020-09-24 DIAGNOSIS — I5032 Chronic diastolic (congestive) heart failure: Secondary | ICD-10-CM

## 2020-09-24 MED ORDER — FUROSEMIDE 40 MG PO TABS: 40.0000 mg | ORAL_TABLET | Freq: Two times a day (BID) | ORAL | 3 refills | Status: DC

## 2020-09-24 NOTE — Addendum Note (Signed)
Addended by: Truddie Hidden on: 09/24/2020 01:58 PM   Modules accepted: Orders

## 2020-09-24 NOTE — Telephone Encounter (Signed)
Pt states that he has shortness of breath which increases with lying down, leg swelling and dizziness for 1 month. Pt also states he wants to switch to Dr. Bettina Gavia as that was who he requested the first time. How do you advise?

## 2020-09-24 NOTE — Telephone Encounter (Signed)
Pt c/o Shortness Of Breath: STAT if SOB developed within the last 24 hours or pt is noticeably SOB on the phone  1. Are you currently SOB (can you hear that pt is SOB on the phone)? No   2. How long have you been experiencing SOB? About of month   3. Are you SOB when sitting or when up moving around? When laying down to go to bed   4. Are you currently experiencing any other symptoms? Dizziness when laying down or when getting up    STAT if patient feels like he/she is going to faint   1) Are you dizzy now? No   2) Do you feel faint or have you passed out? No   3) Do you have any other symptoms? SOB   4) Have you checked your HR and BP (record if available)? No   Requesting an appt with Dr. Bettina Gavia in regards to this

## 2020-10-04 DIAGNOSIS — M109 Gout, unspecified: Secondary | ICD-10-CM | POA: Diagnosis not present

## 2020-10-04 DIAGNOSIS — E785 Hyperlipidemia, unspecified: Secondary | ICD-10-CM | POA: Diagnosis not present

## 2020-10-04 DIAGNOSIS — R5383 Other fatigue: Secondary | ICD-10-CM | POA: Diagnosis not present

## 2020-10-04 DIAGNOSIS — I4891 Unspecified atrial fibrillation: Secondary | ICD-10-CM | POA: Diagnosis not present

## 2020-10-04 DIAGNOSIS — E1169 Type 2 diabetes mellitus with other specified complication: Secondary | ICD-10-CM | POA: Diagnosis not present

## 2020-10-04 DIAGNOSIS — I10 Essential (primary) hypertension: Secondary | ICD-10-CM | POA: Diagnosis not present

## 2020-10-04 DIAGNOSIS — Z79899 Other long term (current) drug therapy: Secondary | ICD-10-CM | POA: Diagnosis not present

## 2020-10-08 ENCOUNTER — Encounter: Payer: Self-pay | Admitting: Cardiology

## 2020-10-08 ENCOUNTER — Other Ambulatory Visit: Payer: Self-pay

## 2020-10-08 ENCOUNTER — Ambulatory Visit: Payer: Medicare HMO | Admitting: Cardiology

## 2020-10-08 VITALS — BP 128/64 | HR 62 | Ht 67.0 in | Wt 200.0 lb

## 2020-10-08 DIAGNOSIS — Z01812 Encounter for preprocedural laboratory examination: Secondary | ICD-10-CM | POA: Diagnosis not present

## 2020-10-08 DIAGNOSIS — Z01818 Encounter for other preprocedural examination: Secondary | ICD-10-CM | POA: Diagnosis not present

## 2020-10-08 DIAGNOSIS — I4819 Other persistent atrial fibrillation: Secondary | ICD-10-CM | POA: Diagnosis not present

## 2020-10-08 NOTE — Patient Instructions (Addendum)
Medication Instructions:  Your physician recommends that you continue on your current medications as directed. Please refer to the Current Medication list given to you today.  *If you need a refill on your cardiac medications before your next appointment, please call your pharmacy*   Lab Work: Today pre procedure labs: BMET & CBC If you have labs (blood work) drawn today and your tests are completely normal, you will receive your results only by: [place] (if you have MyChart) OR A paper copy in the mail If you have any lab test that is abnormal or we need to change your treatment, we will call you to review the results.   Testing/Procedures: None ordered   Follow-Up: At [facility], you and your health needs are our priority.  As part of our continuing mission to provide you with exceptional heart care, we have created designated Provider Care Teams.  These Care Teams include your primary Cardiologist (physician) and Advanced Practice Providers (APPs -  Physician Assistants and Nurse Practitioners) who all work together to provide you with the care you need, when you need it.  We recommend signing up for the patient portal called "MyChart".  Sign up information is provided on this After Visit Summary.  MyChart is used to connect with patients for Virtual Visits (Telemedicine).  Patients are able to view lab/test results, encounter notes, upcoming appointments, etc.  Non-urgent messages can be sent to your provider as well.   To learn more about what you can do with MyChart, go to [email or web address].    Your next appointment:   3 month(s)  The format for your next appointment:   In Person  Provider:   Allegra Lai, MD    Thank you for choosing [facility]!!   Trinidad Curet, RN [phone]   Other Instructions   You are scheduled for a Cardioversion on 10/18/2020 with Dr. Curt Bears.  Please arrive at the [facility] (Main Entrance A) at [facility]: [address] [place], [place] [address] at 1:30 pm.  DIET: Nothing to eat or drink after midnight except a sip of water with medications (see medication instructions below)  FYI: For your safety, and to allow [place] to monitor your vital signs accurately during the surgery/procedure we request that   if you have artificial nails, gel coating, SNS etc. Please have those removed prior to your surgery/procedure. Not having the nail coverings /polish removed may result in cancellation or delay of your surgery/procedure.   Medication Instructions: Hold Lasix  Continue your anticoagulant: Eliquis You will need to continue your anticoagulant after your procedure until you  are told by your provider that it is safe to stop   Labs: 10/08/2020   You must have a responsible person to drive you home and stay in the waiting area during your procedure. Failure to do so could result in cancellation.  Bring your insurance cards.  *Special Note: Every effort is made to have your procedure done on time. Occasionally there are emergencies that occur at the hospital that may cause delays. Please be patient if a delay does occur.

## 2020-10-08 NOTE — Progress Notes (Signed)
Electrophysiology Office Note   Date:  10/08/2020   ID:  Logan [name], DOB 1946-02-20, MRN [id number]  PCP:  [name], MD  Cardiologist:  [place] Primary Electrophysiologist:  Taiz Bickle Meredith Leeds, MD    Chief Complaint: AF   History of Present Illness: Logan [name] is a 75 y.o. male who is being seen today for the evaluation of AF at the request of [name], MD. Presenting today for electrophysiology evaluation.  He has a history significant for atrial fibrillation, hypertension, hyperlipidemia, prediabetes, attempted cardioversion but on hold was shocked 3 times with 200 J and did not convert to sinus rhythm.  He is now status post A. fib ablation 02/29/2020.  Fortunately he was back into atrial fibrillation.  His amiodarone was increased and he had an attempted cardioversion 07/19/2020, but did not convert.  He was noted to be volume overloaded and had his Lasix increased.  His weight came down from 207 to 195 with a great improvement in his symptoms.  Today, denies symptoms of palpitations, chest pain, shortness of breath, orthopnea, PND, lower extremity edema, claudication, dizziness, presyncope, syncope, bleeding, or neurologic sequela. The patient is tolerating medications without difficulties.  His main complaint today is weakness and fatigue.  He has not been able to do his daily activities due to his level of fatigue.  He is also having some left shoulder plain.  He is being evaluated by orthopedic surgery for possible surgery. To get back into normal rhythm if possible.   Past Medical History:  Diagnosis Date   Anxiety    Atherosclerotic renal artery stenosis, unilateral (HCC) 02/28/2014   Atrial fibrillation (HCC)    Bilateral lower extremity edema    Chronic diastolic heart failure ([place]) 10/21/2019   Diabetes mellitus without complication (HCC)    DVT (deep venous thrombosis) (HCC)    Esophageal reflux    Essential hypertension 10/21/2019   Gout     Hyperlipidemia    Hypertension    Mixed hyperlipidemia 10/21/2019   Morbid obesity ([place])    Obesity (BMI 30-39.9) 08/24/2019   Orthopnea    Osteoarthritis    Persistent atrial fibrillation ([place]) 10/21/2019   Seasonal allergies    Sleep apnea    Past Surgical History:  Procedure Laterality Date   ABDOMINAL AORTAGRAM N/A 02/28/2014   Procedure: ABDOMINAL Maxcine Ham;  Surgeon: Serafina Mitchell, MD;  Location: [facility] CATH LAB;  Service: Cardiovascular;  Laterality: N/A;   APPENDECTOMY     ATRIAL FIBRILLATION ABLATION N/A 02/29/2020   Procedure: ATRIAL FIBRILLATION ABLATION;  Surgeon: Constance Haw, MD;  Location: [facility] CV LAB;  Service: Cardiovascular;  Laterality: N/A;   CARPAL TUNNEL RELEASE Left      Current Outpatient Medications  Medication Sig Dispense Refill   allopurinol (ZYLOPRIM) 300 MG tablet Take 300 mg by mouth daily.     amiodarone (PACERONE) 200 MG tablet Take 1 tablet (200 mg total) by mouth daily. 30 tablet 3   apixaban (ELIQUIS) 5 MG TABS tablet Take 1 tablet (5 mg total) by mouth 2 (two) times daily. 60 tablet 5   carvedilol (COREG) 12.5 MG tablet Take 12.5 mg by mouth 2 (two) times daily with a meal.     DHEA 25 MG CAPS Take 25 mg by mouth daily.     doxazosin (CARDURA) 8 MG tablet Take 8 mg by mouth daily.      fluticasone (FLONASE) 50 MCG/ACT nasal spray Place 1 spray into both nostrils daily as needed for rhinitis.  furosemide (LASIX) 40 MG tablet Take 1 tablet (40 mg total) by mouth 2 (two) times daily. 180 tablet 3   ketoconazole (NIZORAL) 2 % cream Apply 1 application topically daily as needed for irritation.      loratadine (CLARITIN) 10 MG tablet Take 10 mg by mouth daily as needed for allergies.     losartan (COZAAR) 100 MG tablet Take 100 mg by mouth daily.     meloxicam (MOBIC) 7.5 MG tablet Take 7.5 mg by mouth 2 (two) times daily as needed.      omeprazole (PRILOSEC) 20 MG capsule Take 20 mg by mouth daily as needed (for heartburn).       potassium chloride SA (KLOR-CON) 20 MEQ tablet Take 1 tablet (20 mEq total) by mouth daily. 90 tablet 3   pravastatin (PRAVACHOL) 20 MG tablet Take 20 mg by mouth daily.      traMADol (ULTRAM) 50 MG tablet Take 50 mg by mouth daily as needed for moderate pain.      vitamin B-12 (CYANOCOBALAMIN) 1000 MCG tablet Take 1,000 mcg by mouth daily.     Vitamin D, Ergocalciferol, (DRISDOL) 1.25 MG (50000 UNIT) CAPS capsule Take 1 capsule every 7 days. (Take one capsule weekly for 12 weeks) 12 capsule 0   No current facility-administered medications for this visit.    Allergies:   Patient has no known allergies.   Social History:  The patient  reports that he has quit smoking. His smokeless tobacco use includes chew. He reports that he does not drink alcohol and does not use drugs.   Family History:  The patient's family history includes Cancer in his father; Heart disease in his mother.   ROS:  Please see the history of present illness.   Otherwise, review of systems is positive for none.   All other systems are reviewed and negative.   PHYSICAL EXAM: VS:  BP (!) 128/94   Pulse 62   Ht 5\' 7"  (1.702 m)   Wt 200 lb (90.7 kg)   BMI 31.32 kg/m  , BMI Body mass index is 31.32 kg/m. GEN: Well nourished, well developed, in no acute distress  HEENT: normal  Neck: no JVD, carotid bruits, or masses Cardiac: RRR; no murmurs, rubs, or gallops,no edema  Respiratory:  clear to auscultation bilaterally, normal work of breathing GI: soft, nontender, nondistended, + BS MS: no deformity or atrophy  Skin: warm and dry Neuro:  Strength and sensation are intact Psych: euthymic mood, full affect  EKG:  EKG is not ordered today. Personal review of the ekg ordered 10/04/20 shows atrial fibrillation, rate 63  Recent Labs: 11/21/2019: ALT 14 05/21/2020: TSH 3.010 06/19/2020: BUN 23; Creatinine, Ser 1.06; Hemoglobin 13.7; Magnesium 1.7; Platelets 237; Potassium 4.2; Sodium 142    Lipid Panel  No results found  for: CHOL, TRIG, HDL, CHOLHDL, VLDL, LDLCALC, LDLDIRECT   Wt Readings from Last 3 Encounters:  10/08/20 200 lb (90.7 kg)  08/02/20 195 lb 3.2 oz (88.5 kg)  07/27/20 207 lb (93.9 kg)      Other studies Reviewed: Additional studies/ records that were reviewed today include: TTE 09/13/19  Review of the above records today demonstrates:   1. Left ventricular ejection fraction, by estimation, is 55 to 60%. The  left ventricle has normal function. The left ventricle has no regional  wall motion abnormalities. There is mild concentric left ventricular  hypertrophy. Left ventricular diastolic  parameters are indeterminate.   2. Right ventricular systolic function is mildly reduced. The  right  ventricular size is mildly enlarged. Mildly increased right ventricular  wall thickness. There is normal pulmonary artery systolic pressure.   3. Right atrial size was severely dilated.   4. The mitral valve is normal in structure. No evidence of mitral valve  regurgitation. No evidence of mitral stenosis.   5. The aortic valve is tricuspid. Aortic valve regurgitation is mild. No  aortic stenosis is present.   6. The inferior vena cava is dilated in size with >50% respiratory  variability, suggesting right atrial pressure of 8 mmHg.    ASSESSMENT AND PLAN:  1.  Persistent atrial fibrillation: Currently in on amiodarone, carvedilol, Xarelto.  High risk medication monitoring.  CHA2DS2-VASc of 2.  Status post ablation 02/29/2020.  Unfortunately he is back in atrial fibrillation.  He had an attempted cardioversion, but did not get back into normal rhythm.  He is feeling weak and fatigued he would like to get back into normal rhythm.  We Yana Schorr plan for cardioversion in EP lab.  2.  Obstructive sleep apnea: CPAP compliance encouraged  3.  Obesity: Diet and exercise encouraged  4.  Hypertension: Currently well controlled   Current medicines are reviewed at length with the patient today.   The patient  does not have concerns regarding his medicines.  The following changes were made today: None  Labs/ tests ordered today include:  Orders Placed This Encounter  Procedures   Basic metabolic panel   CBC      Disposition:   FU with Anyah Swallow 3 months  Signed, Jalei Shibley Meredith Leeds, MD  10/08/2020 11:44 AM     [address] [place] [address] [phone] (office) [phone] (fax)

## 2020-10-08 NOTE — H&P (View-Only) (Signed)
Electrophysiology Office Note   Date:  10/08/2020   ID:  Logan [name], DOB 29-Dec-1945, MRN [id number]  PCP:  [name], MD  Cardiologist:  [place] Primary Electrophysiologist:  Gurfateh Mcclain Meredith Leeds, MD    Chief Complaint: AF   History of Present Illness: Desten [name] is a 75 y.o. male who is being seen today for the evaluation of AF at the request of [name], MD. Presenting today for electrophysiology evaluation.  He has a history significant for atrial fibrillation, hypertension, hyperlipidemia, prediabetes, attempted cardioversion but on hold was shocked 3 times with 200 J and did not convert to sinus rhythm.  He is now status post A. fib ablation 02/29/2020.  Fortunately he was back into atrial fibrillation.  His amiodarone was increased and he had an attempted cardioversion 07/19/2020, but did not convert.  He was noted to be volume overloaded and had his Lasix increased.  His weight came down from 207 to 195 with a great improvement in his symptoms.  Today, denies symptoms of palpitations, chest pain, shortness of breath, orthopnea, PND, lower extremity edema, claudication, dizziness, presyncope, syncope, bleeding, or neurologic sequela. The patient is tolerating medications without difficulties.  His main complaint today is weakness and fatigue.  He has not been able to do his daily activities due to his level of fatigue.  He is also having some left shoulder plain.  He is being evaluated by orthopedic surgery for possible surgery. To get back into normal rhythm if possible.   Past Medical History:  Diagnosis Date   Anxiety    Atherosclerotic renal artery stenosis, unilateral (HCC) 02/28/2014   Atrial fibrillation (HCC)    Bilateral lower extremity edema    Chronic diastolic heart failure ([place]) 10/21/2019   Diabetes mellitus without complication (HCC)    DVT (deep venous thrombosis) (HCC)    Esophageal reflux    Essential hypertension 10/21/2019   Gout     Hyperlipidemia    Hypertension    Mixed hyperlipidemia 10/21/2019   Morbid obesity ([place])    Obesity (BMI 30-39.9) 08/24/2019   Orthopnea    Osteoarthritis    Persistent atrial fibrillation ([place]) 10/21/2019   Seasonal allergies    Sleep apnea    Past Surgical History:  Procedure Laterality Date   ABDOMINAL AORTAGRAM N/A 02/28/2014   Procedure: ABDOMINAL Maxcine Ham;  Surgeon: Serafina Mitchell, MD;  Location: [facility] CATH LAB;  Service: Cardiovascular;  Laterality: N/A;   APPENDECTOMY     ATRIAL FIBRILLATION ABLATION N/A 02/29/2020   Procedure: ATRIAL FIBRILLATION ABLATION;  Surgeon: Constance Haw, MD;  Location: [place] CV LAB;  Service: Cardiovascular;  Laterality: N/A;   CARPAL TUNNEL RELEASE Left      Current Outpatient Medications  Medication Sig Dispense Refill   allopurinol (ZYLOPRIM) 300 MG tablet Take 300 mg by mouth daily.     amiodarone (PACERONE) 200 MG tablet Take 1 tablet (200 mg total) by mouth daily. 30 tablet 3   apixaban (ELIQUIS) 5 MG TABS tablet Take 1 tablet (5 mg total) by mouth 2 (two) times daily. 60 tablet 5   carvedilol (COREG) 12.5 MG tablet Take 12.5 mg by mouth 2 (two) times daily with a meal.     DHEA 25 MG CAPS Take 25 mg by mouth daily.     doxazosin (CARDURA) 8 MG tablet Take 8 mg by mouth daily.      fluticasone (FLONASE) 50 MCG/ACT nasal spray Place 1 spray into both nostrils daily as needed for rhinitis.  furosemide (LASIX) 40 MG tablet Take 1 tablet (40 mg total) by mouth 2 (two) times daily. 180 tablet 3   ketoconazole (NIZORAL) 2 % cream Apply 1 application topically daily as needed for irritation.      loratadine (CLARITIN) 10 MG tablet Take 10 mg by mouth daily as needed for allergies.     losartan (COZAAR) 100 MG tablet Take 100 mg by mouth daily.     meloxicam (MOBIC) 7.5 MG tablet Take 7.5 mg by mouth 2 (two) times daily as needed.      omeprazole (PRILOSEC) 20 MG capsule Take 20 mg by mouth daily as needed (for heartburn).       potassium chloride SA (KLOR-CON) 20 MEQ tablet Take 1 tablet (20 mEq total) by mouth daily. 90 tablet 3   pravastatin (PRAVACHOL) 20 MG tablet Take 20 mg by mouth daily.      traMADol (ULTRAM) 50 MG tablet Take 50 mg by mouth daily as needed for moderate pain.      vitamin B-12 (CYANOCOBALAMIN) 1000 MCG tablet Take 1,000 mcg by mouth daily.     Vitamin D, Ergocalciferol, (DRISDOL) 1.25 MG (50000 UNIT) CAPS capsule Take 1 capsule every 7 days. (Take one capsule weekly for 12 weeks) 12 capsule 0   No current facility-administered medications for this visit.    Allergies:   Patient has no known allergies.   Social History:  The patient  reports that he has quit smoking. His smokeless tobacco use includes chew. He reports that he does not drink alcohol and does not use drugs.   Family History:  The patient's family history includes Cancer in his father; Heart disease in his mother.   ROS:  Please see the history of present illness.   Otherwise, review of systems is positive for none.   All other systems are reviewed and negative.   PHYSICAL EXAM: VS:  BP (!) 128/94   Pulse 62   Ht 5\' 7"  (1.702 m)   Wt 200 lb (90.7 kg)   BMI 31.32 kg/m  , BMI Body mass index is 31.32 kg/m. GEN: Well nourished, well developed, in no acute distress  HEENT: normal  Neck: no JVD, carotid bruits, or masses Cardiac: RRR; no murmurs, rubs, or gallops,no edema  Respiratory:  clear to auscultation bilaterally, normal work of breathing GI: soft, nontender, nondistended, + BS MS: no deformity or atrophy  Skin: warm and dry Neuro:  Strength and sensation are intact Psych: euthymic mood, full affect  EKG:  EKG is not ordered today. Personal review of the ekg ordered 10/04/20 shows atrial fibrillation, rate 63  Recent Labs: 11/21/2019: ALT 14 05/21/2020: TSH 3.010 06/19/2020: BUN 23; Creatinine, Ser 1.06; Hemoglobin 13.7; Magnesium 1.7; Platelets 237; Potassium 4.2; Sodium 142    Lipid Panel  No results found  for: CHOL, TRIG, HDL, CHOLHDL, VLDL, LDLCALC, LDLDIRECT   Wt Readings from Last 3 Encounters:  10/08/20 200 lb (90.7 kg)  08/02/20 195 lb 3.2 oz (88.5 kg)  07/27/20 207 lb (93.9 kg)      Other studies Reviewed: Additional studies/ records that were reviewed today include: TTE 09/13/19  Review of the above records today demonstrates:   1. Left ventricular ejection fraction, by estimation, is 55 to 60%. The  left ventricle has normal function. The left ventricle has no regional  wall motion abnormalities. There is mild concentric left ventricular  hypertrophy. Left ventricular diastolic  parameters are indeterminate.   2. Right ventricular systolic function is mildly reduced. The  right  ventricular size is mildly enlarged. Mildly increased right ventricular  wall thickness. There is normal pulmonary artery systolic pressure.   3. Right atrial size was severely dilated.   4. The mitral valve is normal in structure. No evidence of mitral valve  regurgitation. No evidence of mitral stenosis.   5. The aortic valve is tricuspid. Aortic valve regurgitation is mild. No  aortic stenosis is present.   6. The inferior vena cava is dilated in size with >50% respiratory  variability, suggesting right atrial pressure of 8 mmHg.    ASSESSMENT AND PLAN:  1.  Persistent atrial fibrillation: Currently in on amiodarone, carvedilol, Xarelto.  High risk medication monitoring.  CHA2DS2-VASc of 2.  Status post ablation 02/29/2020.  Unfortunately he is back in atrial fibrillation.  He had an attempted cardioversion, but did not get back into normal rhythm.  He is feeling weak and fatigued he would like to get back into normal rhythm.  We Marqus Macphee plan for cardioversion in EP lab.  2.  Obstructive sleep apnea: CPAP compliance encouraged  3.  Obesity: Diet and exercise encouraged  4.  Hypertension: Currently well controlled   Current medicines are reviewed at length with the patient today.   The patient  does not have concerns regarding his medicines.  The following changes were made today: None  Labs/ tests ordered today include:  Orders Placed This Encounter  Procedures   Basic metabolic panel   CBC      Disposition:   FU with Graceanne Guin 3 months  Signed, Zoie Sarin Meredith Leeds, MD  10/08/2020 11:44 AM     [address] [place] [address] [phone] (office) [phone] (fax)

## 2020-10-09 LAB — CBC
Hematocrit: 39.4 % (ref 37.5–51.0)
Hemoglobin: 13.1 g/dL (ref 13.0–17.7)
MCH: 33.5 pg — ABNORMAL HIGH (ref 26.6–33.0)
MCHC: 33.2 g/dL (ref 31.5–35.7)
MCV: 101 fL — ABNORMAL HIGH (ref 79–97)
Platelets: 226 10*3/uL (ref 150–450)
RBC: 3.91 x10E6/uL — ABNORMAL LOW (ref 4.14–5.80)
RDW: 13.1 % (ref 11.6–15.4)
WBC: 7.8 10*3/uL (ref 3.4–10.8)

## 2020-10-09 LAB — BASIC METABOLIC PANEL
BUN/Creatinine Ratio: 16 (ref 10–24)
BUN: 22 mg/dL (ref 8–27)
CO2: 29 mmol/L (ref 20–29)
Calcium: 9.7 mg/dL (ref 8.6–10.2)
Chloride: 99 mmol/L (ref 96–106)
Creatinine, Ser: 1.37 mg/dL — ABNORMAL HIGH (ref 0.76–1.27)
Glucose: 92 mg/dL (ref 65–99)
Potassium: 4.5 mmol/L (ref 3.5–5.2)
Sodium: 142 mmol/L (ref 134–144)
eGFR: 55 mL/min/{1.73_m2} — ABNORMAL LOW (ref >59)

## 2020-10-17 ENCOUNTER — Telehealth: Payer: Self-pay | Admitting: Cardiology

## 2020-10-17 NOTE — Telephone Encounter (Signed)
Reviewed with pt. Advised ok to take morning medications w/ sip of water and to hold vitamins/supplements & Lasix. Patient verbalized understanding and agreeable to plan.

## 2020-10-17 NOTE — Pre-Procedure Instructions (Signed)
Instructed patient on the following items: Arrival time 1330 Nothing to eat or drink after midnight No meds AM of procedure Responsible person to drive you home and stay with you for 24 hrs  Have you missed any doses of anti-coagulant Eliquis- hasn't missed any doses

## 2020-10-17 NOTE — Telephone Encounter (Signed)
Pt is wanting to know if he is able to take his medication prior to admission tomorrow.. please advise

## 2020-10-18 ENCOUNTER — Ambulatory Visit (HOSPITAL_COMMUNITY): Payer: Medicare HMO | Admitting: Certified Registered"

## 2020-10-18 ENCOUNTER — Ambulatory Visit (HOSPITAL_COMMUNITY)
Admission: RE | Admit: 2020-10-18 | Discharge: 2020-10-18 | Disposition: A | Discharge status: Home / Self Care | Payer: Medicare HMO | Attending: Cardiology | Admitting: Cardiology

## 2020-10-18 ENCOUNTER — Ambulatory Visit: Payer: Medicare HMO | Admitting: Cardiology

## 2020-10-18 ENCOUNTER — Encounter (HOSPITAL_COMMUNITY)
Admission: RE | Disposition: A | Discharge status: Home / Self Care | Payer: Self-pay | Source: Home / Self Care | Attending: Cardiology

## 2020-10-18 ENCOUNTER — Encounter (HOSPITAL_COMMUNITY): Payer: Self-pay | Admitting: Cardiology

## 2020-10-18 ENCOUNTER — Other Ambulatory Visit: Payer: Self-pay

## 2020-10-18 DIAGNOSIS — E1169 Type 2 diabetes mellitus with other specified complication: Secondary | ICD-10-CM | POA: Diagnosis not present

## 2020-10-18 DIAGNOSIS — Z7182 Exercise counseling: Secondary | ICD-10-CM | POA: Diagnosis not present

## 2020-10-18 DIAGNOSIS — I4819 Other persistent atrial fibrillation: Secondary | ICD-10-CM | POA: Diagnosis not present

## 2020-10-18 DIAGNOSIS — Z7901 Long term (current) use of anticoagulants: Secondary | ICD-10-CM | POA: Diagnosis not present

## 2020-10-18 DIAGNOSIS — E669 Obesity, unspecified: Secondary | ICD-10-CM | POA: Diagnosis not present

## 2020-10-18 DIAGNOSIS — E782 Mixed hyperlipidemia: Secondary | ICD-10-CM | POA: Diagnosis not present

## 2020-10-18 DIAGNOSIS — I110 Hypertensive heart disease with heart failure: Secondary | ICD-10-CM | POA: Diagnosis not present

## 2020-10-18 DIAGNOSIS — I4891 Unspecified atrial fibrillation: Secondary | ICD-10-CM | POA: Diagnosis not present

## 2020-10-18 DIAGNOSIS — Z6831 Body mass index (BMI) 31.0-31.9, adult: Secondary | ICD-10-CM | POA: Insufficient documentation

## 2020-10-18 DIAGNOSIS — E785 Hyperlipidemia, unspecified: Secondary | ICD-10-CM | POA: Diagnosis not present

## 2020-10-18 DIAGNOSIS — Z87891 Personal history of nicotine dependence: Secondary | ICD-10-CM | POA: Insufficient documentation

## 2020-10-18 DIAGNOSIS — G4733 Obstructive sleep apnea (adult) (pediatric): Secondary | ICD-10-CM | POA: Diagnosis not present

## 2020-10-18 DIAGNOSIS — I5032 Chronic diastolic (congestive) heart failure: Secondary | ICD-10-CM | POA: Insufficient documentation

## 2020-10-18 DIAGNOSIS — I10 Essential (primary) hypertension: Secondary | ICD-10-CM | POA: Diagnosis not present

## 2020-10-18 DIAGNOSIS — Z79899 Other long term (current) drug therapy: Secondary | ICD-10-CM | POA: Insufficient documentation

## 2020-10-18 HISTORY — PX: CARDIOVERSION: EP1203

## 2020-10-18 LAB — PROTIME-INR
INR: 1.8 — ABNORMAL HIGH (ref 0.8–1.2)
Prothrombin Time: 20.7 seconds — ABNORMAL HIGH (ref 11.4–15.2)

## 2020-10-18 SURGERY — CARDIOVERSION (CATH LAB)
Anesthesia: General

## 2020-10-18 MED ORDER — PROPOFOL 10 MG/ML IV BOLUS
INTRAVENOUS | Status: DC | PRN
  Administered 2020-10-18: 80 mg via INTRAVENOUS

## 2020-10-18 MED ORDER — LIDOCAINE 2% (20 MG/ML) 5 ML SYRINGE
INTRAMUSCULAR | Status: DC | PRN
  Administered 2020-10-18: 20 mg via INTRAVENOUS

## 2020-10-18 MED ORDER — SODIUM CHLORIDE 0.9 % IV SOLN: INTRAVENOUS | Status: DC | PRN

## 2020-10-18 SURGICAL SUPPLY — 1 items: PAD DEFIB LIFELINK (PAD) ×2 IMPLANT

## 2020-10-18 NOTE — Progress Notes (Signed)
Red raw like area noted to bilat groins and on penis.

## 2020-10-18 NOTE — Transfer of Care (Signed)
Immediate Anesthesia Transfer of Care Note  Patient: Logan [name]  Procedure(s) Performed: CARDIOVERSION (CATH LAB)  Patient Location: Cath Lab  Anesthesia Type:General  Level of Consciousness: awake, alert  and oriented  Airway & Oxygen Therapy: Patient Spontanous Breathing and Patient connected to nasal cannula oxygen  Post-op Assessment: Report given to RN  Post vital signs: Reviewed and stable  Last Vitals:  Vitals Value Taken Time  BP 102/52 10/18/20 1437  Temp 36.7 C 10/18/20 1437  Pulse 66 10/18/20 1438  Resp 17 10/18/20 1438  SpO2 95 % 10/18/20 1438  Vitals shown include unvalidated device data.  Last Pain:  Vitals:   10/18/20 1437  TempSrc: Temporal  PainSc: 0-No pain      Patients Stated Pain Goal: 3 (70/17/79 3903)  Complications: No notable events documented.

## 2020-10-18 NOTE — Interval H&P Note (Signed)
History and Physical Interval Note:  10/18/2020 1:42 PM  Logan [name]  has presented today for surgery, with the diagnosis of afib.  The various methods of treatment have been discussed with the patient and family. After consideration of risks, benefits and other options for treatment, the patient has consented to  Procedure(s): CARDIOVERSION (CATH LAB) (N/A) as a surgical intervention.  The patient's history has been reviewed, patient examined, no change in status, stable for surgery.  I have reviewed the patient's chart and labs.  Questions were answered to the patient's satisfaction.     Shereta Crothers [employer]

## 2020-10-18 NOTE — Anesthesia Preprocedure Evaluation (Signed)
Anesthesia Evaluation  Patient identified by MRN, date of birth, ID band Patient awake    Reviewed: Allergy & Precautions, H&P , NPO status , Patient's Chart, lab work & pertinent test results  Airway Mallampati: II   Neck ROM: full    Dental   Pulmonary sleep apnea , former smoker,    breath sounds clear to auscultation       Cardiovascular hypertension, + Peripheral Vascular Disease  + dysrhythmias Atrial Fibrillation  Rhythm:irregular Rate:Normal     Neuro/Psych PSYCHIATRIC DISORDERS Anxiety    GI/Hepatic GERD  ,  Endo/Other  diabetes, Type 2  Renal/GU      Musculoskeletal  (+) Arthritis ,   Abdominal   Peds  Hematology   Anesthesia Other Findings   Reproductive/Obstetrics                             Anesthesia Physical Anesthesia Plan  ASA: 3  Anesthesia Plan: General   Post-op Pain Management:    Induction: Intravenous  PONV Risk Score and Plan: 2 and Propofol infusion and Treatment may vary due to age or medical condition  Airway Management Planned: Mask  Additional Equipment:   Intra-op Plan:   Post-operative Plan:   Informed Consent: I have reviewed the patients History and Physical, chart, labs and discussed the procedure including the risks, benefits and alternatives for the proposed anesthesia with the patient or authorized representative who has indicated his/her understanding and acceptance.     Dental advisory given  Plan Discussed with: CRNA, Anesthesiologist and Surgeon  Anesthesia Plan Comments:         Anesthesia Quick Evaluation

## 2020-10-19 ENCOUNTER — Encounter (HOSPITAL_COMMUNITY): Payer: Self-pay | Admitting: Cardiology

## 2020-10-19 NOTE — Anesthesia Postprocedure Evaluation (Signed)
Anesthesia Post Note  Patient: Logan [name]  Procedure(s) Performed: CARDIOVERSION (CATH LAB)     Patient location during evaluation: PACU Anesthesia Type: General Level of consciousness: awake and alert Pain management: pain level controlled Vital Signs Assessment: post-procedure vital signs reviewed and stable Respiratory status: spontaneous breathing, nonlabored ventilation, respiratory function stable and patient connected to nasal cannula oxygen Cardiovascular status: blood pressure returned to baseline and stable Postop Assessment: no apparent nausea or vomiting Anesthetic complications: no   No notable events documented.  Last Vitals:  Vitals:   10/18/20 1519 10/18/20 1600  BP: (!) 131/40 (!) 133/48  Pulse:  70  Resp:  (!) 22  Temp:    SpO2:  95%    Last Pain:  Vitals:   10/18/20 1600  TempSrc:   PainSc: 0-No pain                 [name]

## 2020-10-23 ENCOUNTER — Other Ambulatory Visit: Payer: Self-pay | Admitting: Cardiology

## 2020-10-25 NOTE — Progress Notes (Signed)
Cardiology Office Note:    Date:  10/26/2020   ID:  Logan [name], DOB 1945/10/21, MRN [id number]  PCP:  [name], MD  Cardiologist:  Shirlee More, MD    Referring MD: [name], MD    ASSESSMENT:    1. Paroxysmal atrial fibrillation (HCC)   2. On amiodarone therapy   3. Chronic anticoagulation   4. Hypertensive heart disease, unspecified whether heart failure present    PLAN:    In order of problems listed above:  Unfortunately he has failed to maintain sinus rhythm I suspect he has amiodarone toxicity both with his gait balance muscle weakness and I told him to stop the drug.  Last thyroid test were in January recheck a thyroid profile.  Continue beta-blocker for rate control Continue his anticoagulant I asked him to take a multivitamin to help with skin integrity He takes multiple medications potentially contributing to his profound weakness he takes an alpha-blocker for hypertension a can cause edema and orthostatic shifts I asked him to stop Cardura he has no edema reduce his diuretic 50% he has profound proximal muscle weakness stop his statin. He will trend heart rate and blood pressure daily record and I will see him back in the office in 4 to 6 weeks. The only other alternative for his heart rhythm would be Tikosyn and he would require washout of amiodarone and inpatient initiation.  I suspect clinically that he will be in chronic atrial fibrillation from this point forward   Next appointment: 4 to 6 weeks he request to see me I took care of his wife about a decade ago although I have never cared for him.   Medication Adjustments/Labs and Tests Ordered: Current medicines are reviewed at length with the patient today.  Concerns regarding medicines are outlined above.  No orders of the defined types were placed in this encounter.  No orders of the defined types were placed in this encounter.   Chief Complaint  Patient presents with   Follow-up   Atrial  Fibrillation    He is on amiodarone    History of Present Illness:    Logan [name] is a 75 y.o. male with a hx of paroxysmal atrial fibrillation hypertension hyperlipidemia prediabetes and has had an atrial fibrillation EP ablation 02/29/2020.  He was last seen 10/08/2020 in sinus rhythm on amiodarone and underwent cardioversion.  Compliance with diet, lifestyle and medications: Yes   Is a very complex visit for me I have never been involved in this man's care until today. His significant other is present and tells me that she was told the cardioversion worked and to continue amiodarone. He is unimproved and he is in atrial fibrillation with a controlled rate today. His primary complaint is diffuse and profound weakness he is having trouble caring for himself he has limited and even struggles to get out of a wheelchair.  He has had episodes of systolic blood pressure less than 90 feeling weak and questions whether medications are part of his problem.  He is not having palpitation syncope edema chest pain or shortness of breath. He complains of bruising with his anticoagulant he does not take aspirin. Past Medical History:  Diagnosis Date   Anxiety    Atherosclerotic renal artery stenosis, unilateral ([place]) 02/28/2014   Atrial fibrillation (HCC)    Bilateral lower extremity edema    Chronic diastolic heart failure ([place]) 10/21/2019   Diabetes mellitus without complication ([place])    DVT (deep venous thrombosis) ([place])  Esophageal reflux    Essential hypertension 10/21/2019   Gout    Hyperlipidemia    Hypertension    Mixed hyperlipidemia 10/21/2019   Morbid obesity ([place])    Obesity (BMI 30-39.9) 08/24/2019   Orthopnea    Osteoarthritis    Persistent atrial fibrillation ([place]) 10/21/2019   Seasonal allergies    Sleep apnea     Past Surgical History:  Procedure Laterality Date   ABDOMINAL AORTAGRAM N/A 02/28/2014   Procedure: ABDOMINAL Maxcine Ham;  Surgeon: Serafina Mitchell, MD;  Location: [facility]  CATH LAB;  Service: Cardiovascular;  Laterality: N/A;   APPENDECTOMY     ATRIAL FIBRILLATION ABLATION N/A 02/29/2020   Procedure: ATRIAL FIBRILLATION ABLATION;  Surgeon: Constance Haw, MD;  Location: [facility] LAB;  Service: Cardiovascular;  Laterality: N/A;   CARDIOVERSION N/A 10/18/2020   Procedure: CARDIOVERSION (CATH LAB);  Surgeon: Constance Haw, MD;  Location: [facility];  Service: Cardiovascular;  Laterality: N/A;   CARPAL TUNNEL RELEASE Left     Current Medications: Current Meds  Medication Sig   allopurinol (ZYLOPRIM) 300 MG tablet Take 300 mg by mouth daily.   amiodarone (PACERONE) 200 MG tablet Take 1/2 (one-half) tablet by mouth once daily   apixaban (ELIQUIS) 5 MG TABS tablet Take 1 tablet (5 mg total) by mouth 2 (two) times daily.   carvedilol (COREG) 12.5 MG tablet Take 12.5 mg by mouth 2 (two) times daily with a meal.   DHEA 25 MG CAPS Take 25 mg by mouth daily.   doxazosin (CARDURA) 8 MG tablet Take 8 mg by mouth daily.    fluticasone (FLONASE) 50 MCG/ACT nasal spray Place 1 spray into both nostrils daily as needed for rhinitis.    furosemide (LASIX) 40 MG tablet Take 1 tablet (40 mg total) by mouth 2 (two) times daily.   ketoconazole (NIZORAL) 2 % cream Apply 1 application topically daily as needed for irritation.    loratadine (CLARITIN) 10 MG tablet Take 10 mg by mouth daily as needed for allergies.   losartan (COZAAR) 100 MG tablet Take 100 mg by mouth daily.   meloxicam (MOBIC) 7.5 MG tablet Take 7.5 mg by mouth 2 (two) times daily as needed.    omeprazole (PRILOSEC) 20 MG capsule Take 20 mg by mouth daily as needed (for heartburn).    potassium chloride SA (KLOR-CON) 20 MEQ tablet Take 1 tablet (20 mEq total) by mouth daily.   pravastatin (PRAVACHOL) 20 MG tablet Take 20 mg by mouth daily.    traMADol (ULTRAM) 50 MG tablet Take 50 mg by mouth daily as needed for moderate pain.    vitamin B-12 (CYANOCOBALAMIN) 1000 MCG tablet Take 1,000 mcg  by mouth daily.   Vitamin D, Ergocalciferol, (DRISDOL) 1.25 MG (50000 UNIT) CAPS capsule Take 1 capsule every 7 days. (Take one capsule weekly for 12 weeks)     Allergies:   Patient has no known allergies.   Social History   Socioeconomic History   Marital status: Widowed    Spouse name: Not on file   Number of children: Not on file   Years of education: Not on file   Highest education level: Not on file  Occupational History   Not on file  Tobacco Use   Smoking status: Former    Pack years: 0.00   Smokeless tobacco: Former    Types: Chew  Substance and Sexual Activity   Alcohol use: Never   Drug use: Never   Sexual activity: Not on  file  Other Topics Concern   Not on file  Social History Narrative   Not on file   Social Determinants of Health   Financial Resource Strain: Not on file  Food Insecurity: Not on file  Transportation Needs: Not on file  Physical Activity: Not on file  Stress: Not on file  Social Connections: Not on file     Family History: The patient's family history includes Cancer in his father; Heart disease in his mother. ROS:   Please see the history of present illness.    All other systems reviewed and are negative.  EKGs/Labs/Other Studies Reviewed:    The following studies were reviewed today:  EKG:  EKG ordered today and personally reviewed.  The ekg ordered today demonstrates rate controlled atrial fibrillation  Recent Labs: 11/21/2019: ALT 14 05/21/2020: TSH 3.010 06/19/2020: Magnesium 1.7 10/08/2020: BUN 22; Creatinine, Ser 1.37; Hemoglobin 13.1; Platelets 226; Potassium 4.5; Sodium 142  Recent Lipid Panel No results found for: CHOL, TRIG, HDL, CHOLHDL, VLDL, LDLCALC, LDLDIRECT  Physical Exam:    VS:  BP (!) 90/50 (BP Location: Right Arm, Patient Position: Sitting, Cuff Size: Normal)   Pulse 63   Ht 5\' 7"  (1.702 m)   Wt 195 lb (88.5 kg)   SpO2 97%   BMI 30.54 kg/m     Wt Readings from Last 3 Encounters:  10/26/20 195 lb (88.5  kg)  10/18/20 200 lb (90.7 kg)  10/08/20 200 lb (90.7 kg)  Blood pressure by me 110/70 sitting and standing  GEN: He looks very weak debilitated flat affect depressed mood very chronically ill poor muscle mass.  He requires assistance to transfer and stand well nourished, well developed in no acute distress HEENT: Normal NECK: No JVD; No carotid bruits LYMPHATICS: No lymphadenopathy CARDIAC: Irregular rate and rhythm RRR, no murmurs, rubs, gallops RESPIRATORY:  Clear to auscultation without rales, wheezing or rhonchi  ABDOMEN: Soft, non-tender, non-distended MUSCULOSKELETAL:  No edema; No deformity  SKIN: Warm and dry multiple ecchymoses of his skin NEUROLOGIC:  Alert and oriented x 3 PSYCHIATRIC:  Normal affect    Signed, Shirlee More, MD  10/26/2020 4:13 PM    [facility]

## 2020-10-26 ENCOUNTER — Ambulatory Visit: Payer: Medicare HMO | Admitting: Cardiology

## 2020-10-26 ENCOUNTER — Other Ambulatory Visit: Payer: Self-pay

## 2020-10-26 ENCOUNTER — Encounter: Payer: Self-pay | Admitting: Cardiology

## 2020-10-26 VITALS — BP 90/50 | HR 63 | Ht 67.0 in | Wt 195.0 lb

## 2020-10-26 DIAGNOSIS — Z7901 Long term (current) use of anticoagulants: Secondary | ICD-10-CM

## 2020-10-26 DIAGNOSIS — I119 Hypertensive heart disease without heart failure: Secondary | ICD-10-CM

## 2020-10-26 DIAGNOSIS — I480 Paroxysmal atrial fibrillation: Secondary | ICD-10-CM | POA: Diagnosis not present

## 2020-10-26 DIAGNOSIS — Z79899 Other long term (current) drug therapy: Secondary | ICD-10-CM | POA: Diagnosis not present

## 2020-10-26 MED ORDER — FUROSEMIDE 40 MG PO TABS: 40.0000 mg | ORAL_TABLET | Freq: Every day | ORAL | 3 refills | Status: DC

## 2020-10-26 NOTE — Patient Instructions (Signed)
Medication Instructions:  Your physician has recommended you make the following change in your medication:  STOP: Pravastatin  STOP: Amiodarone  STOP: Doxazosin DECREASE: Furosemide 40 mg take one tablet by mouth daily.  Please take a multivitamin daily.  *If you need a refill on your cardiac medications before your next appointment, please call your pharmacy*   Lab Work: Your physician has recommended you make the following change in your medication: TODAY  TSH,T3, T4 If you have labs (blood work) drawn today and your tests are completely normal, you will receive your results only by: [place] (if you have MyChart) OR A paper copy in the mail If you have any lab test that is abnormal or we need to change your treatment, we will call you to review the results.   Testing/Procedures: None   Follow-Up: At [facility], you and your health needs are our priority.  As part of our continuing mission to provide you with exceptional heart care, we have created designated Provider Care Teams.  These Care Teams include your primary Cardiologist (physician) and Advanced Practice Providers (APPs -  Physician Assistants and Nurse Practitioners) who all work together to provide you with the care you need, when you need it.  We recommend signing up for the patient portal called "MyChart".  Sign up information is provided on this After Visit Summary.  MyChart is used to connect with patients for Virtual Visits (Telemedicine).  Patients are able to view lab/test results, encounter notes, upcoming appointments, etc.  Non-urgent messages can be sent to your provider as well.   To learn more about what you can do with MyChart, go to [email or web address].    Your next appointment:   6 week(s)  The format for your next appointment:   In Person  Provider:   Shirlee More, MD   Other Instructions

## 2020-10-27 LAB — TSH+T4F+T3FREE
Free T4: 1.46 ng/dL (ref 0.82–1.77)
T3, Free: 2.2 pg/mL (ref 2.0–4.4)
TSH: 3.24 u[IU]/mL (ref 0.450–4.500)

## 2020-11-01 DIAGNOSIS — M4802 Spinal stenosis, cervical region: Secondary | ICD-10-CM | POA: Diagnosis not present

## 2020-11-01 DIAGNOSIS — M47812 Spondylosis without myelopathy or radiculopathy, cervical region: Secondary | ICD-10-CM | POA: Diagnosis not present

## 2020-11-01 DIAGNOSIS — M545 Low back pain, unspecified: Secondary | ICD-10-CM | POA: Diagnosis not present

## 2020-11-01 DIAGNOSIS — M47816 Spondylosis without myelopathy or radiculopathy, lumbar region: Secondary | ICD-10-CM | POA: Diagnosis not present

## 2020-11-01 DIAGNOSIS — M48061 Spinal stenosis, lumbar region without neurogenic claudication: Secondary | ICD-10-CM | POA: Diagnosis not present

## 2020-11-01 DIAGNOSIS — I6529 Occlusion and stenosis of unspecified carotid artery: Secondary | ICD-10-CM | POA: Diagnosis not present

## 2020-11-01 DIAGNOSIS — I700 Atherosclerosis of aorta: Secondary | ICD-10-CM | POA: Diagnosis not present

## 2020-11-01 DIAGNOSIS — M542 Cervicalgia: Secondary | ICD-10-CM | POA: Diagnosis not present

## 2020-11-01 DIAGNOSIS — M5412 Radiculopathy, cervical region: Secondary | ICD-10-CM | POA: Diagnosis not present

## 2020-11-01 DIAGNOSIS — M160 Bilateral primary osteoarthritis of hip: Secondary | ICD-10-CM | POA: Diagnosis not present

## 2020-11-02 DIAGNOSIS — M545 Low back pain, unspecified: Secondary | ICD-10-CM | POA: Diagnosis not present

## 2020-11-02 DIAGNOSIS — M542 Cervicalgia: Secondary | ICD-10-CM | POA: Diagnosis not present

## 2020-11-04 ENCOUNTER — Observation Stay (HOSPITAL_COMMUNITY)
Admission: EM | Admit: 2020-11-04 | Discharge: 2020-11-05 | Disposition: A | Discharge status: Home / Self Care | Payer: Medicare HMO | Attending: Internal Medicine | Admitting: Internal Medicine

## 2020-11-04 ENCOUNTER — Other Ambulatory Visit: Payer: Self-pay

## 2020-11-04 DIAGNOSIS — Z79899 Other long term (current) drug therapy: Secondary | ICD-10-CM | POA: Diagnosis not present

## 2020-11-04 DIAGNOSIS — Z20822 Contact with and (suspected) exposure to covid-19: Secondary | ICD-10-CM | POA: Diagnosis not present

## 2020-11-04 DIAGNOSIS — I4819 Other persistent atrial fibrillation: Secondary | ICD-10-CM | POA: Insufficient documentation

## 2020-11-04 DIAGNOSIS — I5023 Acute on chronic systolic (congestive) heart failure: Secondary | ICD-10-CM | POA: Diagnosis not present

## 2020-11-04 DIAGNOSIS — E119 Type 2 diabetes mellitus without complications: Secondary | ICD-10-CM | POA: Insufficient documentation

## 2020-11-04 DIAGNOSIS — I10 Essential (primary) hypertension: Secondary | ICD-10-CM | POA: Diagnosis not present

## 2020-11-04 DIAGNOSIS — M5412 Radiculopathy, cervical region: Secondary | ICD-10-CM | POA: Diagnosis not present

## 2020-11-04 DIAGNOSIS — M1612 Unilateral primary osteoarthritis, left hip: Secondary | ICD-10-CM | POA: Diagnosis not present

## 2020-11-04 DIAGNOSIS — R531 Weakness: Secondary | ICD-10-CM | POA: Diagnosis not present

## 2020-11-04 DIAGNOSIS — J168 Pneumonia due to other specified infectious organisms: Secondary | ICD-10-CM | POA: Diagnosis not present

## 2020-11-04 DIAGNOSIS — J90 Pleural effusion, not elsewhere classified: Secondary | ICD-10-CM | POA: Diagnosis not present

## 2020-11-04 DIAGNOSIS — Z7901 Long term (current) use of anticoagulants: Secondary | ICD-10-CM | POA: Diagnosis not present

## 2020-11-04 DIAGNOSIS — J189 Pneumonia, unspecified organism: Secondary | ICD-10-CM

## 2020-11-04 DIAGNOSIS — I509 Heart failure, unspecified: Secondary | ICD-10-CM

## 2020-11-04 DIAGNOSIS — R0602 Shortness of breath: Secondary | ICD-10-CM | POA: Diagnosis not present

## 2020-11-04 DIAGNOSIS — S199XXA Unspecified injury of neck, initial encounter: Secondary | ICD-10-CM | POA: Diagnosis not present

## 2020-11-04 DIAGNOSIS — I110 Hypertensive heart disease with heart failure: Secondary | ICD-10-CM | POA: Diagnosis not present

## 2020-11-04 DIAGNOSIS — M542 Cervicalgia: Secondary | ICD-10-CM | POA: Diagnosis present

## 2020-11-04 DIAGNOSIS — S0990XA Unspecified injury of head, initial encounter: Secondary | ICD-10-CM | POA: Diagnosis not present

## 2020-11-04 NOTE — ED Triage Notes (Signed)
Patient reports chronic pain at bilateral arms /legs , upper back and neck pain for several weeks , denies recent fall or injury .

## 2020-11-05 ENCOUNTER — Encounter (HOSPITAL_COMMUNITY): Payer: Self-pay | Admitting: Internal Medicine

## 2020-11-05 ENCOUNTER — Emergency Department (HOSPITAL_COMMUNITY): Payer: Medicare HMO

## 2020-11-05 ENCOUNTER — Observation Stay (HOSPITAL_COMMUNITY): Payer: Medicare HMO

## 2020-11-05 DIAGNOSIS — S199XXA Unspecified injury of neck, initial encounter: Secondary | ICD-10-CM | POA: Diagnosis not present

## 2020-11-05 DIAGNOSIS — M2578 Osteophyte, vertebrae: Secondary | ICD-10-CM | POA: Diagnosis not present

## 2020-11-05 DIAGNOSIS — I509 Heart failure, unspecified: Secondary | ICD-10-CM

## 2020-11-05 DIAGNOSIS — R296 Repeated falls: Secondary | ICD-10-CM | POA: Diagnosis not present

## 2020-11-05 DIAGNOSIS — M5412 Radiculopathy, cervical region: Secondary | ICD-10-CM

## 2020-11-05 DIAGNOSIS — J90 Pleural effusion, not elsewhere classified: Secondary | ICD-10-CM | POA: Diagnosis not present

## 2020-11-05 DIAGNOSIS — R202 Paresthesia of skin: Secondary | ICD-10-CM | POA: Diagnosis not present

## 2020-11-05 DIAGNOSIS — M47812 Spondylosis without myelopathy or radiculopathy, cervical region: Secondary | ICD-10-CM | POA: Diagnosis not present

## 2020-11-05 DIAGNOSIS — J189 Pneumonia, unspecified organism: Secondary | ICD-10-CM | POA: Insufficient documentation

## 2020-11-05 DIAGNOSIS — R0602 Shortness of breath: Secondary | ICD-10-CM | POA: Diagnosis not present

## 2020-11-05 DIAGNOSIS — S0990XA Unspecified injury of head, initial encounter: Secondary | ICD-10-CM | POA: Diagnosis not present

## 2020-11-05 DIAGNOSIS — M1612 Unilateral primary osteoarthritis, left hip: Secondary | ICD-10-CM | POA: Diagnosis not present

## 2020-11-05 HISTORY — DX: Heart failure, unspecified: I50.9

## 2020-11-05 HISTORY — DX: Radiculopathy, cervical region: M54.12

## 2020-11-05 LAB — COMPREHENSIVE METABOLIC PANEL
ALT: 19 U/L (ref 0–44)
AST: 20 U/L (ref 15–41)
Albumin: 3.5 g/dL (ref 3.5–5.0)
Alkaline Phosphatase: 63 U/L (ref 38–126)
Anion gap: 8 (ref 5–15)
BUN: 17 mg/dL (ref 8–23)
CO2: 33 mmol/L — ABNORMAL HIGH (ref 22–32)
Calcium: 9.6 mg/dL (ref 8.9–10.3)
Chloride: 100 mmol/L (ref 98–111)
Creatinine, Ser: 1.19 mg/dL (ref 0.61–1.24)
GFR, Estimated: >60 mL/min (ref >60)
Glucose, Bld: 99 mg/dL (ref 70–99)
Potassium: 3.5 mmol/L (ref 3.5–5.1)
Sodium: 141 mmol/L (ref 135–145)
Total Bilirubin: 0.7 mg/dL (ref 0.3–1.2)
Total Protein: 6 g/dL — ABNORMAL LOW (ref 6.5–8.1)

## 2020-11-05 LAB — URINALYSIS, ROUTINE W REFLEX MICROSCOPIC
Bilirubin Urine: NEGATIVE
Glucose, UA: NEGATIVE mg/dL
Hgb urine dipstick: NEGATIVE
Ketones, ur: NEGATIVE mg/dL
Leukocytes,Ua: NEGATIVE
Nitrite: NEGATIVE
Protein, ur: NEGATIVE mg/dL
Specific Gravity, Urine: 1.024 (ref 1.005–1.030)
pH: 5 (ref 5.0–8.0)

## 2020-11-05 LAB — CBC WITH DIFFERENTIAL/PLATELET
Abs Immature Granulocytes: 0.07 10*3/uL (ref 0.00–0.07)
Basophils Absolute: 0.1 10*3/uL (ref 0.0–0.1)
Basophils Relative: 1 %
Eosinophils Absolute: 0.2 10*3/uL (ref 0.0–0.5)
Eosinophils Relative: 2 %
HCT: 42.1 % (ref 39.0–52.0)
Hemoglobin: 13.8 g/dL (ref 13.0–17.0)
Immature Granulocytes: 1 %
Lymphocytes Relative: 25 %
Lymphs Abs: 2.4 10*3/uL (ref 0.7–4.0)
MCH: 34.2 pg — ABNORMAL HIGH (ref 26.0–34.0)
MCHC: 32.8 g/dL (ref 30.0–36.0)
MCV: 104.5 fL — ABNORMAL HIGH (ref 80.0–100.0)
Monocytes Absolute: 0.9 10*3/uL (ref 0.1–1.0)
Monocytes Relative: 10 %
Neutro Abs: 6 10*3/uL (ref 1.7–7.7)
Neutrophils Relative %: 61 %
Platelets: 251 10*3/uL (ref 150–400)
RBC: 4.03 MIL/uL — ABNORMAL LOW (ref 4.22–5.81)
RDW: 14.7 % (ref 11.5–15.5)
WBC: 9.6 10*3/uL (ref 4.0–10.5)
nRBC: 0 % (ref 0.0–0.2)

## 2020-11-05 LAB — BRAIN NATRIURETIC PEPTIDE: B Natriuretic Peptide: 111.3 pg/mL — ABNORMAL HIGH (ref 0.0–100.0)

## 2020-11-05 LAB — MAGNESIUM: Magnesium: 1.9 mg/dL (ref 1.7–2.4)

## 2020-11-05 LAB — PROCALCITONIN: Procalcitonin: <0.1 ng/mL

## 2020-11-05 LAB — RESP PANEL BY RT-PCR (FLU A&B, COVID) ARPGX2
Influenza A by PCR: NEGATIVE
Influenza B by PCR: NEGATIVE
SARS Coronavirus 2 by RT PCR: NEGATIVE

## 2020-11-05 LAB — VITAMIN B12: Vitamin B-12: 1029 pg/mL — ABNORMAL HIGH (ref 180–914)

## 2020-11-05 IMAGING — CT CT CERVICAL SPINE W/O CM
5 of 8 series · 15 of 34 positions shown, 16 images · non-contrast
Comparison: CT cervical spine [DATE]

CLINICAL DATA: Head injury

EXAM:
CT HEAD WITHOUT CONTRAST
CT CERVICAL SPINE WITHOUT CONTRAST
TECHNIQUE: Multidetector CT imaging of the head and cervical spine was
performed following the standard protocol without intravenous
contrast. Multiplanar CT image reconstructions of the cervical spine
were also generated.

[Series 6: c spine soft · axial · 0.32mm/px · z∈[-244,-182]mm · 2 of 94 slices shown (1 of 2)]
[im 32/94  soft-tissue]
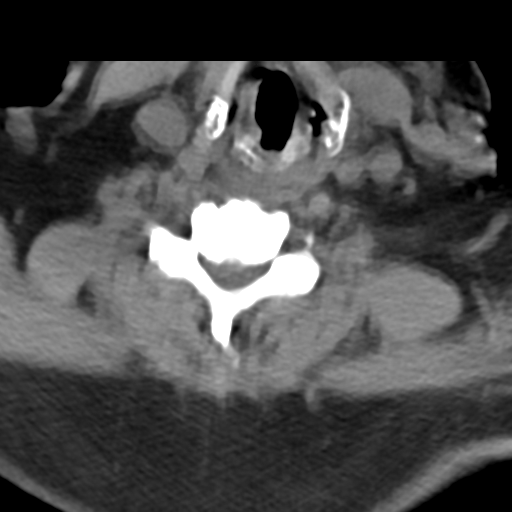
[im 63/94  soft-tissue]
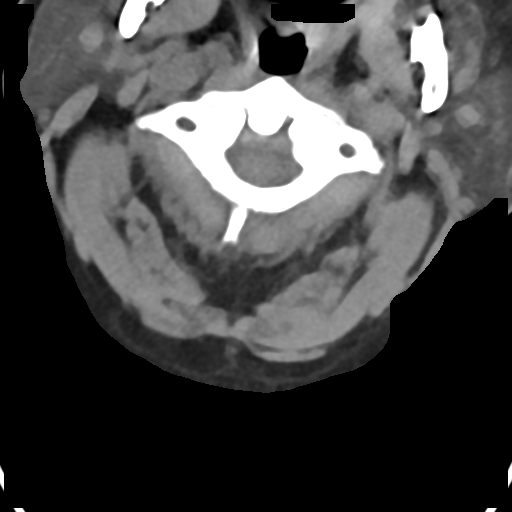

[Series 9: sag bone · sagittal · 0.35mm/px · 5 of 103 slices shown]
[im 18/103  bone]
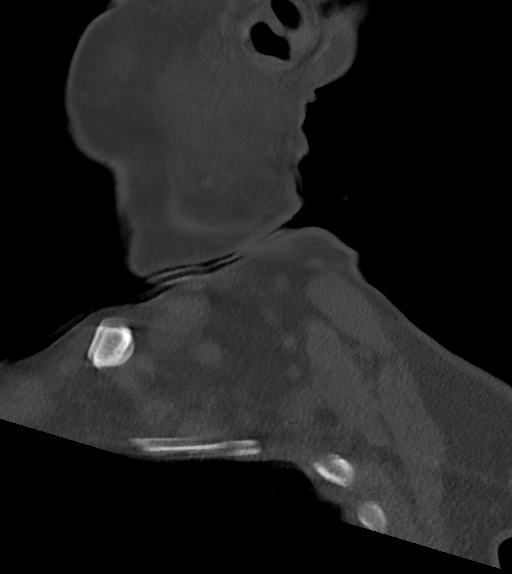
[im 35/103  bone]
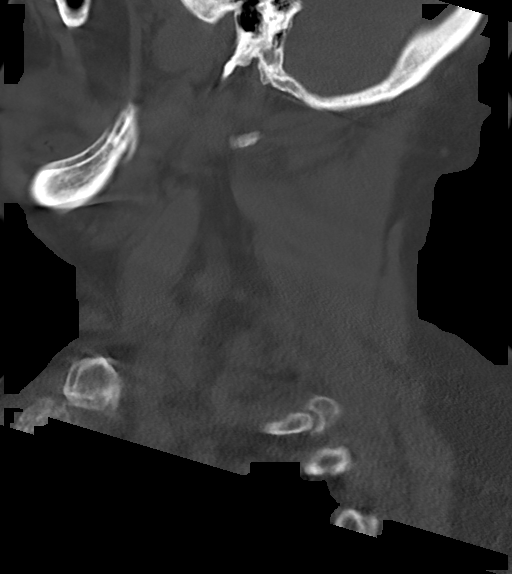
[im 52/103  bone]
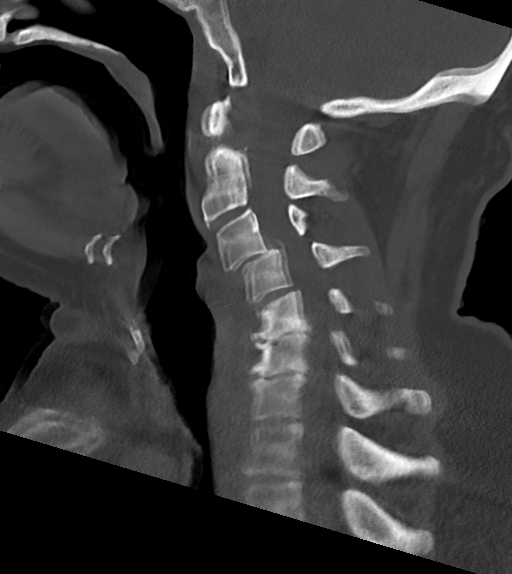
[im 69/103  bone]
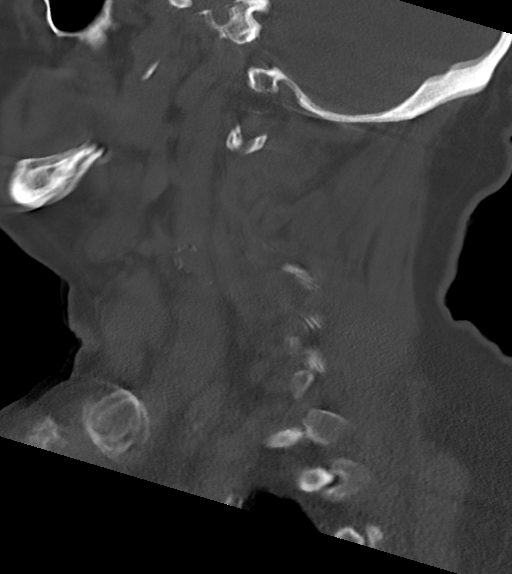
[im 86/103  bone]
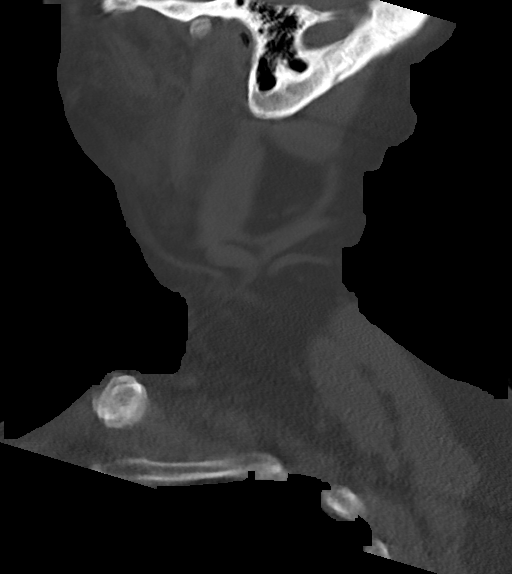

[Series 10: cor bone · coronal · 0.39mm/px · 3 of 137 slices shown]
[im 35/137  bone]
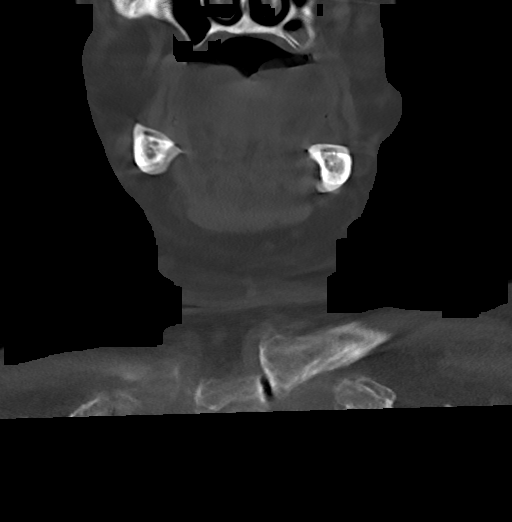
[im 69/137  bone]
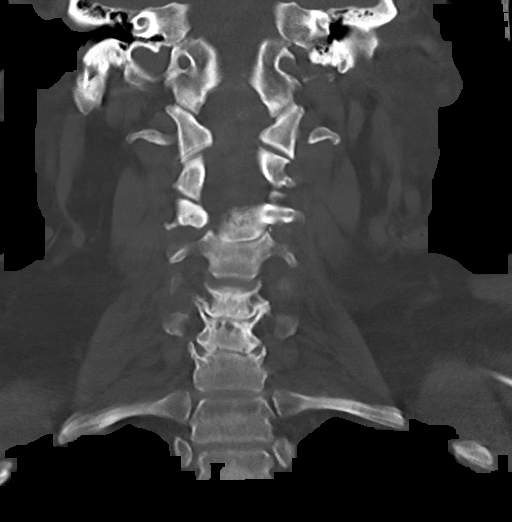
[im 103/137  bone]
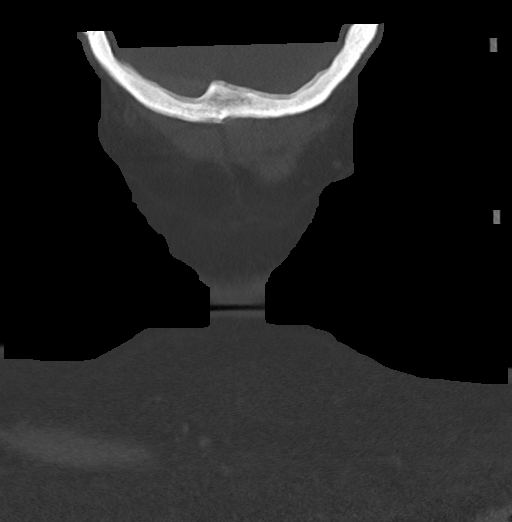

[Series 11: orthogonal axials · axial · 0.21mm/px · z∈[-315,-174]mm · 3 of 84 slices shown, 4 images]
[im 1/84  soft-tissue]
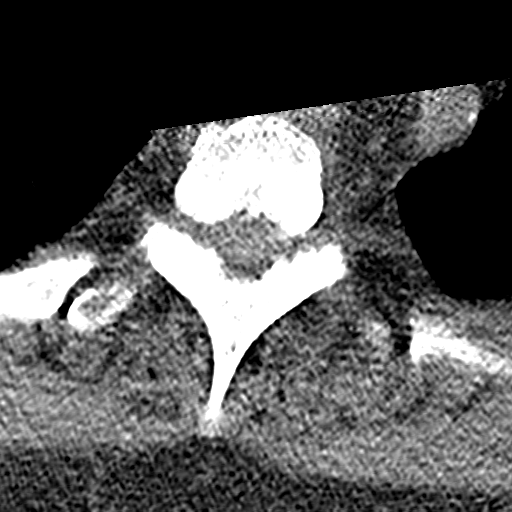
[im 1/84  bone]
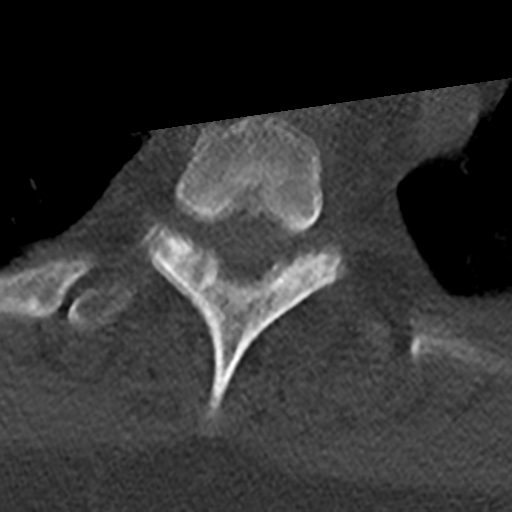
[im 42/84  bone]
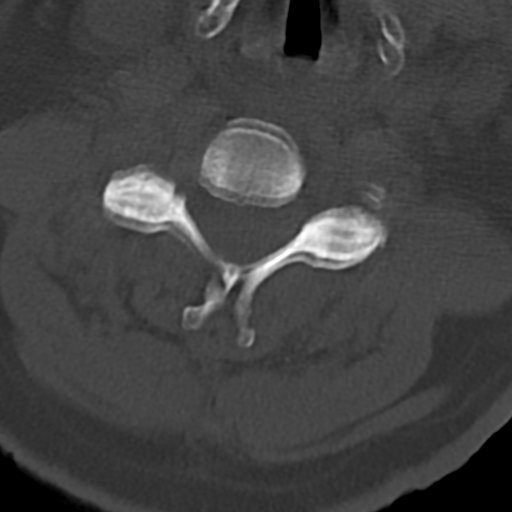
[im 84/84  bone]
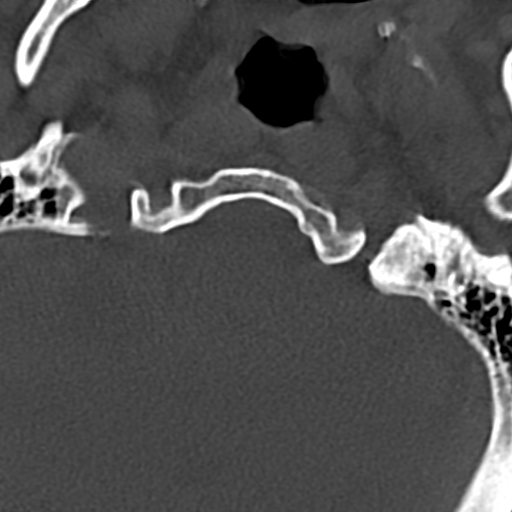

[Series 13: c spine soft · axial · 0.31mm/px · z∈[-240,-180]mm · 2 of 90 slices shown (2 of 2)]
[im 30/90  soft-tissue]
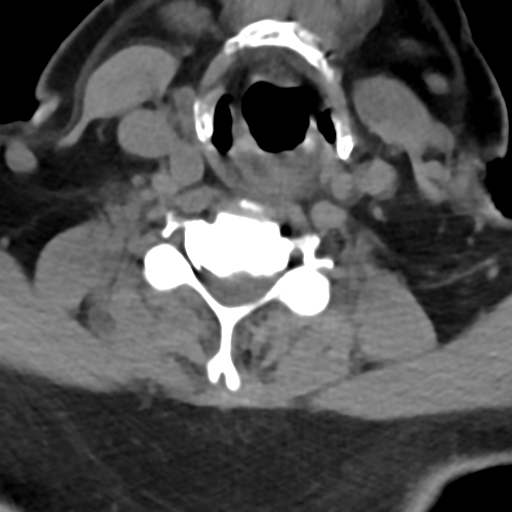
[im 60/90  soft-tissue]
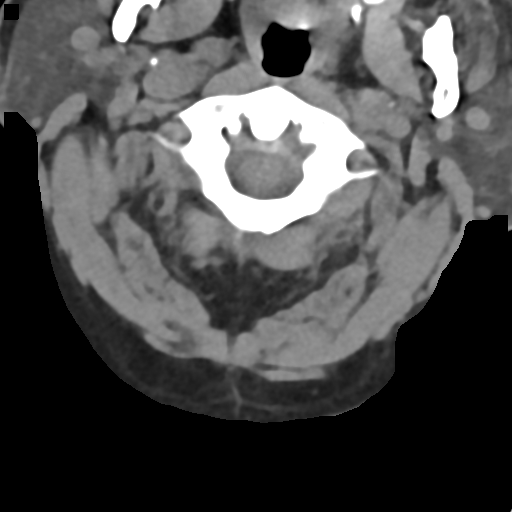

[15 of 34 positions shown; findings below may reference images not displayed]

FINDINGS: CT HEAD FINDINGS

Brain: Normal anatomic configuration. Parenchymal volume loss is
commensurate with the patient's age. No abnormal intra or
extra-axial mass lesion or fluid collection. No abnormal mass effect
or midline shift. No evidence of acute intracranial hemorrhage or
infarct. Ventricular size is normal. Cerebellum unremarkable.

Vascular: No asymmetric hyperdense vasculature at the skull base.

Skull: Intact

Sinuses/Orbits: Mild mucosal thickening within the left frontal
sinus and right sphenoid sinus. Small mucous retention cyst within
the visualized left maxillary sinus. No air-fluid levels. Remaining
paranasal sinuses are clear. Sinuses are clear. Orbits are
unremarkable.

Other: Mastoid air cells and middle ear cavities are clear.

CT CERVICAL SPINE FINDINGS

Alignment: There is moderate motion artifact which slightly limits
evaluation of the cervical spine. There is, again noted, reversal of
the normal cervical lordosis at C3-C7 with grade 2 anterolisthesis
of C3 upon C4 measuring approximately 5 mm, both unchanged. 3 mm
anterolisthesis of C4 on C5 also stable.

Skull base and vertebrae: The craniocervical alignment is normal.
The atlantodental interval is not widened. No definite acute
fracture of the cervical spine. Ankylosis of the left C2-3 facet is
again noted.

Soft tissues and spinal canal: There is moderate central canal
stenosis posterior to C4 secondary to anterolisthesis resulting in
mild flattening of the thecal sac. There is mild central canal
stenosis at C5-6 and C6-7 secondary to posterior disc osteophyte
complex ease resulting in mild flattening of the thecal sac at these
levels. No prevertebral soft tissue swelling. No paraspinal fluid
collections. No canal hematoma identified.

Disc levels: There is intervertebral disc space narrowing at C3-4
with exclusion of the disc material anteriorly. There is
intervertebral disc space narrowing and endplate remodeling at C5-C7
in keeping with changes of advanced degenerative disc disease.
Review of the axial images demonstrates severe bilateral knee
neuroforaminal narrowing at C3-4 secondary to anterolisthesis in
combination with advanced bilateral facet arthrosis. Moderate to
severe bilateral neuroforaminal narrowing is noted at C5-6 and to a
lesser extent, C6-7 secondary to uncovertebral arthrosis.

Upper chest: Unremarkable

Other: None
IMPRESSION: No acute intracranial abnormality.  No calvarial fracture.

Mild paranasal sinus disease.

No acute fracture or traumatic listhesis of the cervical spine.

Stable grade 2 anterolisthesis of C3 upon C4, likely related to
underlying facet arthrosis, with resultant moderate central canal
stenosis and severe bilateral neuroforaminal narrowing.

Stable changes of degenerative disc and degenerative joint disease
at C5-C7 resulting in mild central canal stenosis and
mild-to-moderate bilateral neuroforaminal narrowing.

## 2020-11-05 IMAGING — DX DG CHEST 1V PORT
1 series · 1 of 1 positions shown · non-contrast
Comparison: [DATE]

CLINICAL DATA: Shortness of breath

EXAM:
PORTABLE CHEST 1 VIEW

[chest ap]
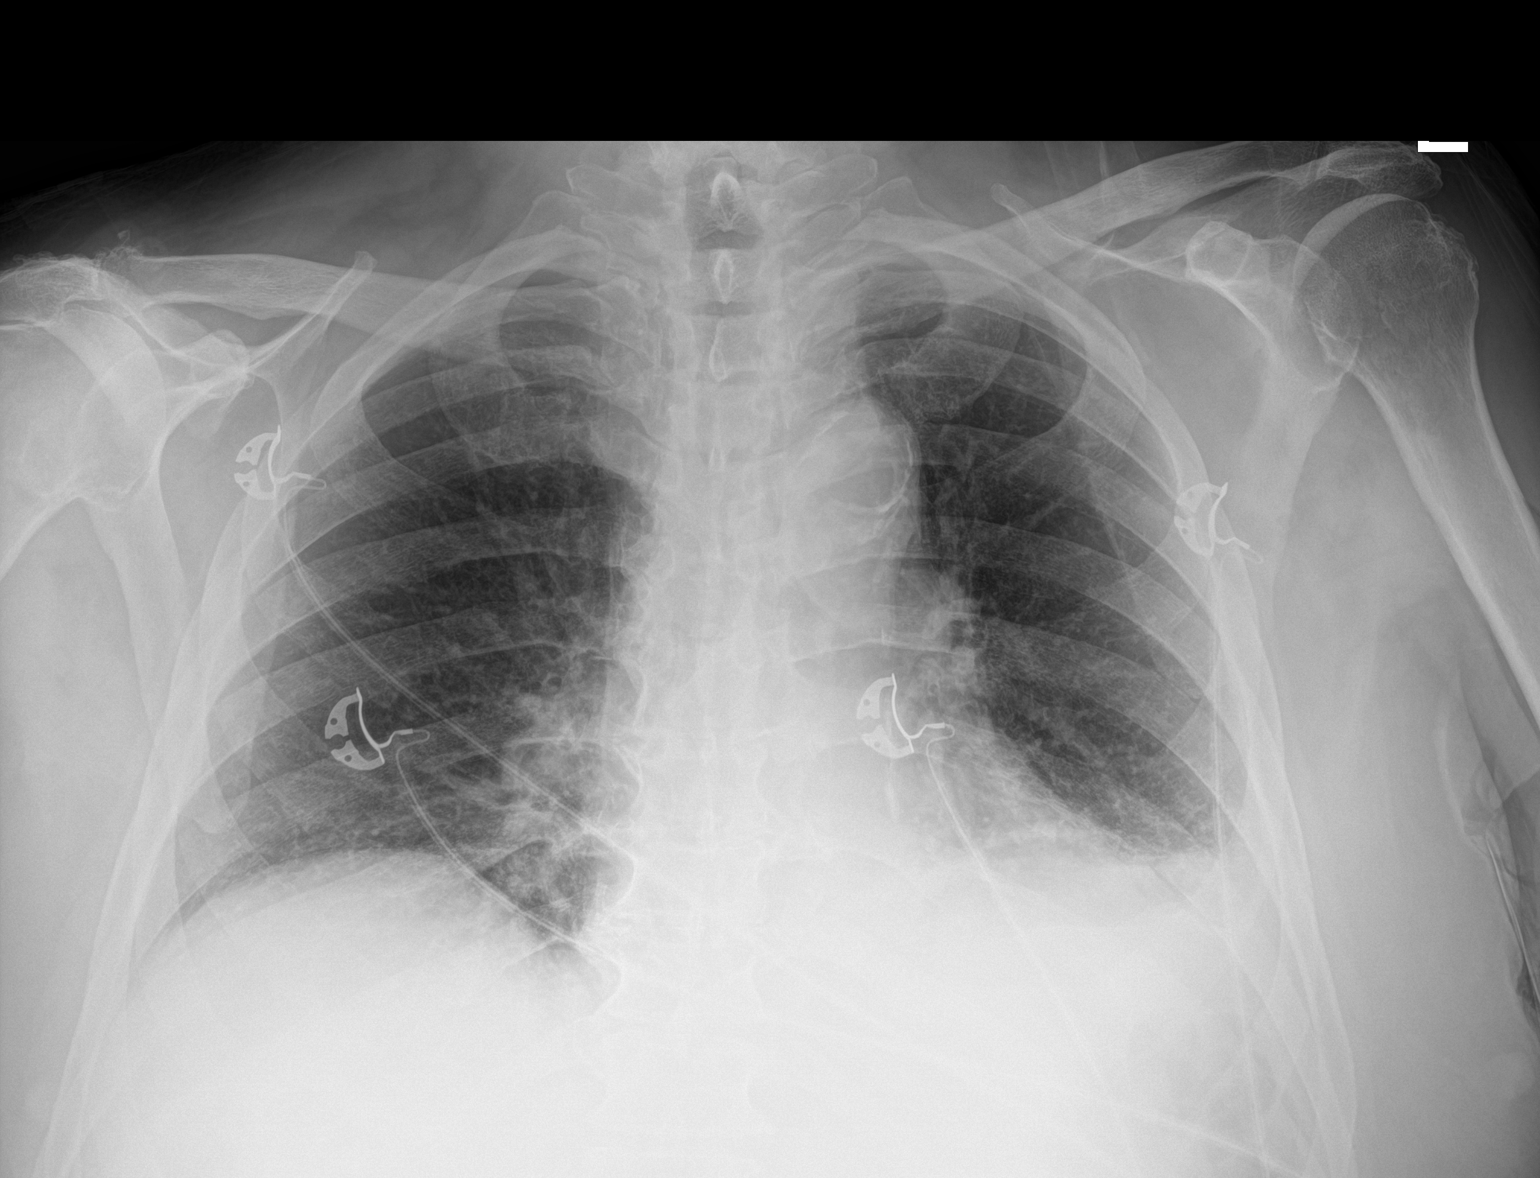

[1 of 1 positions shown; findings below may reference images not displayed]

FINDINGS: Patchy left lower lobe opacity, atelectasis versus pneumonia, with
associated small left pleural effusion.

No frank interstitial edema.  No pneumothorax.

The heart is normal in size.  Thoracic aortic atherosclerosis.
IMPRESSION: Patchy left lower lobe opacity, atelectasis versus pneumonia.
Associated small left pleural effusion.

## 2020-11-05 IMAGING — CR DG HIP (WITH OR WITHOUT PELVIS) 2-3V*L*
3 series · 3 of 3 positions shown · non-contrast
Comparison: None.

CLINICAL DATA: Left hip pain

EXAM:
DG HIP (WITH OR WITHOUT PELVIS) 2-3V LEFT

[hip lat]
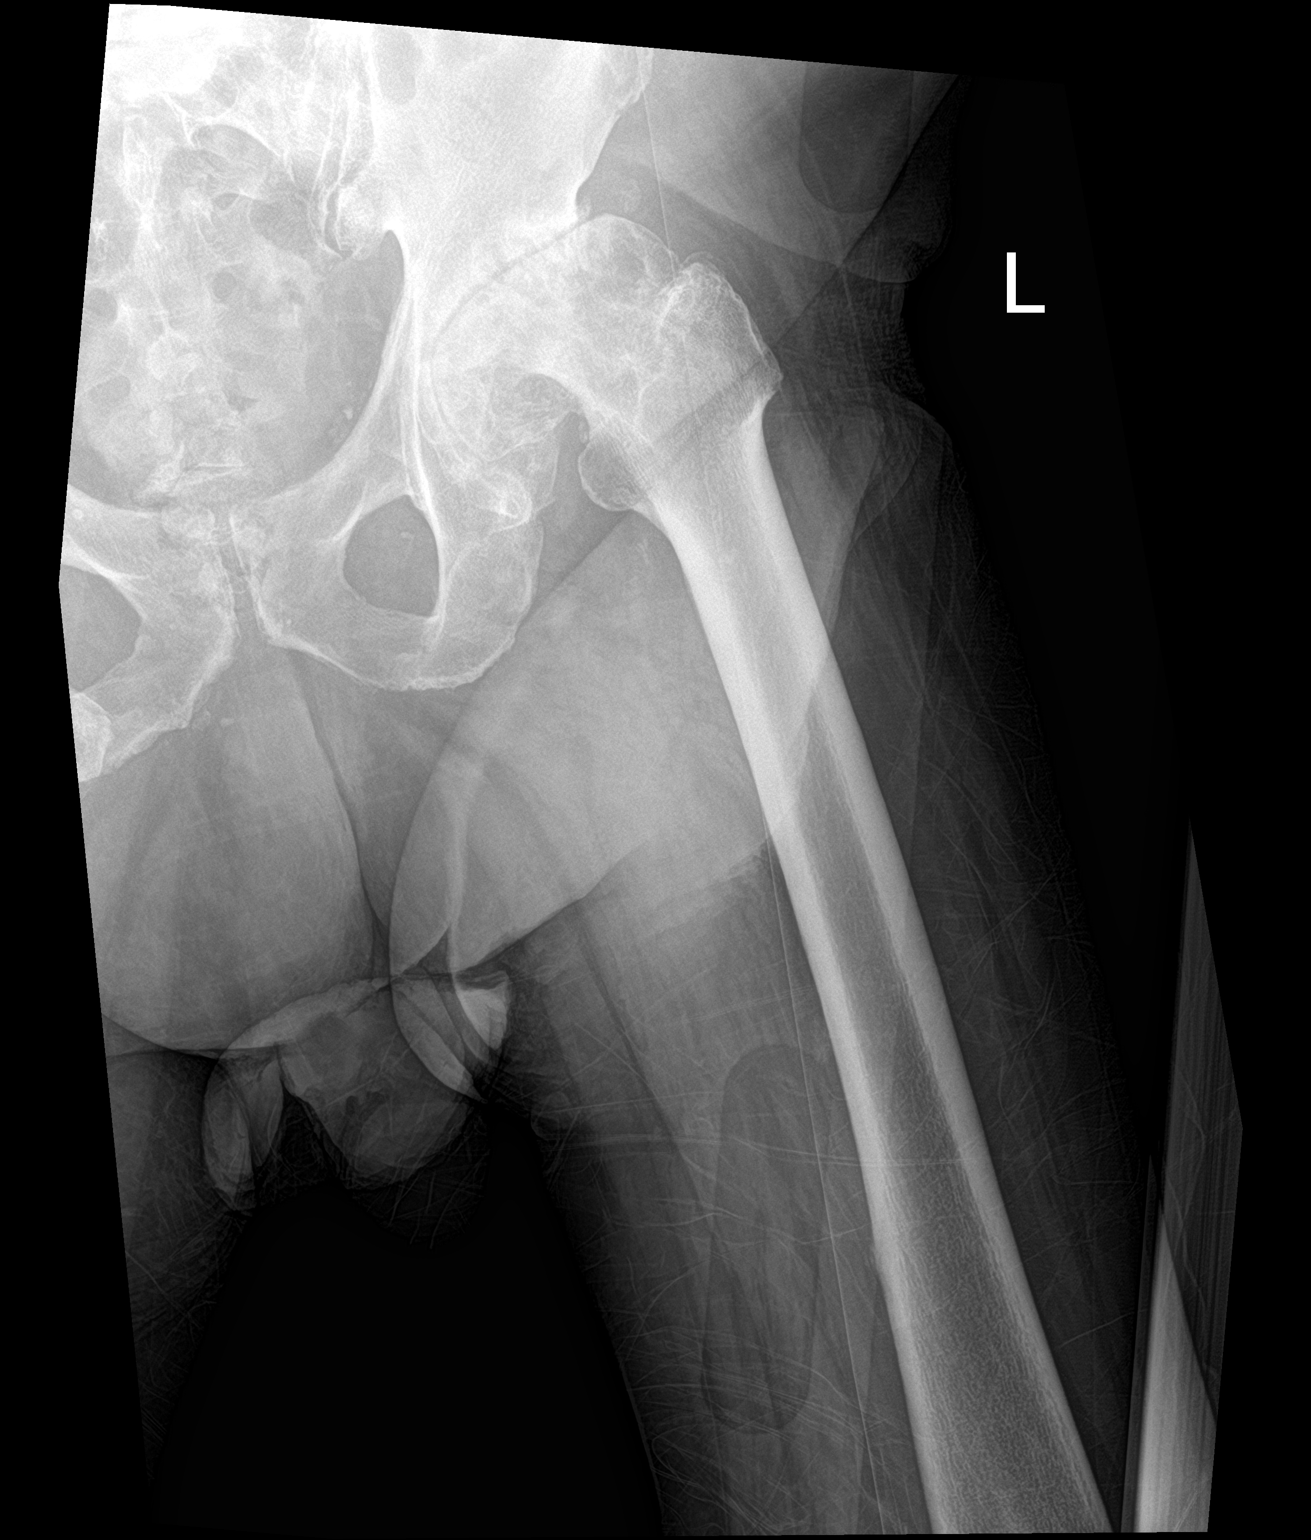

[hip ap]
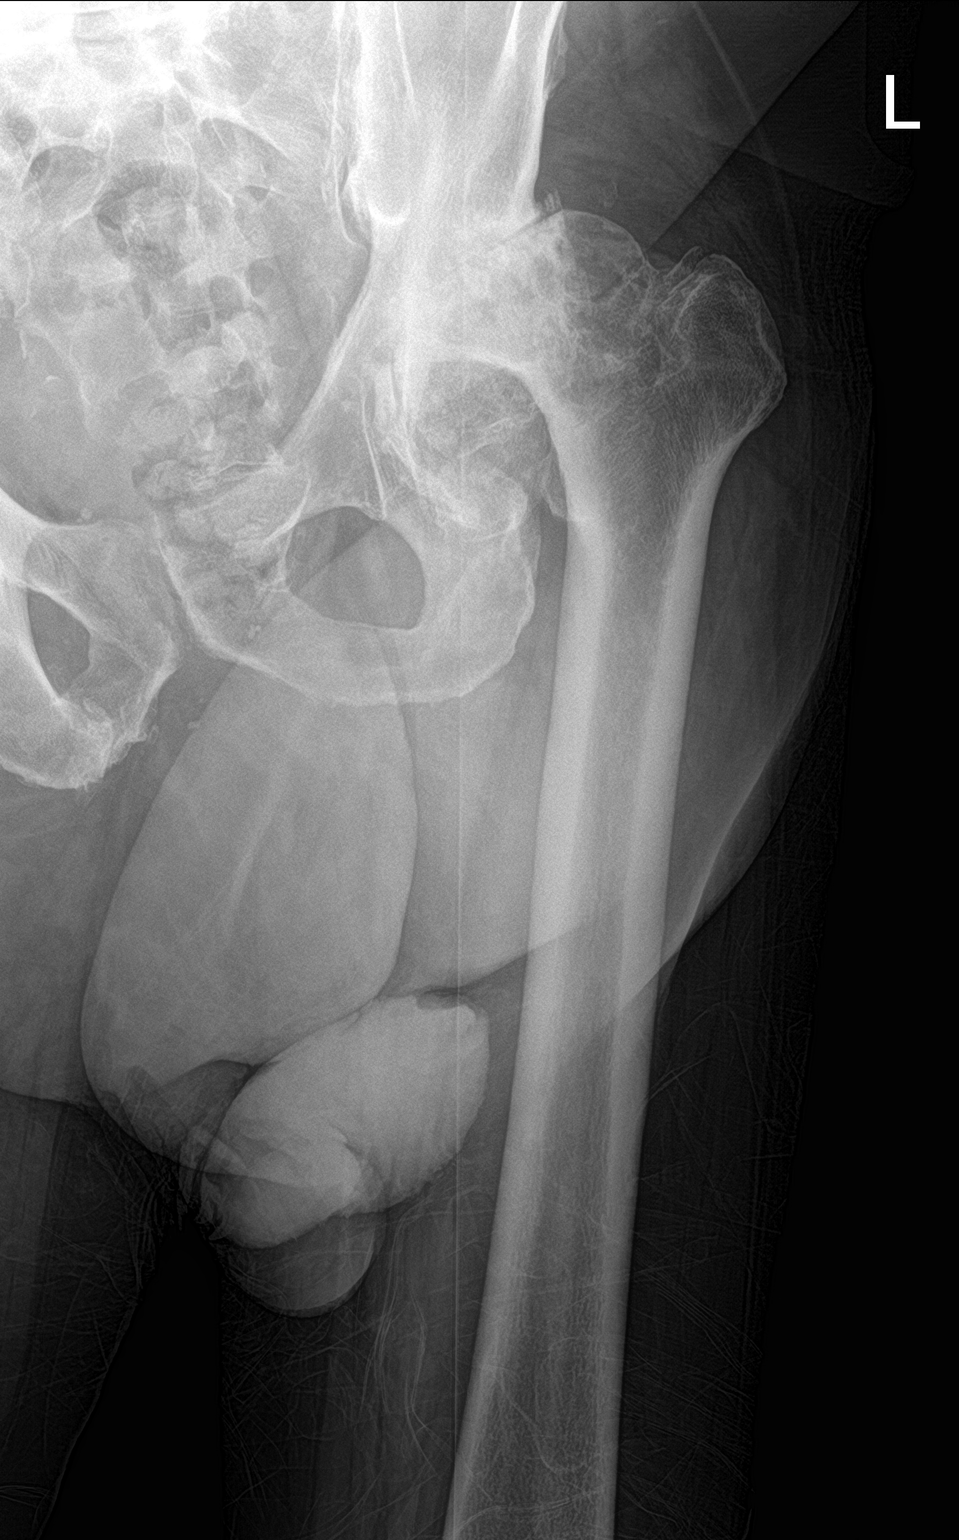

[pelvis ap]
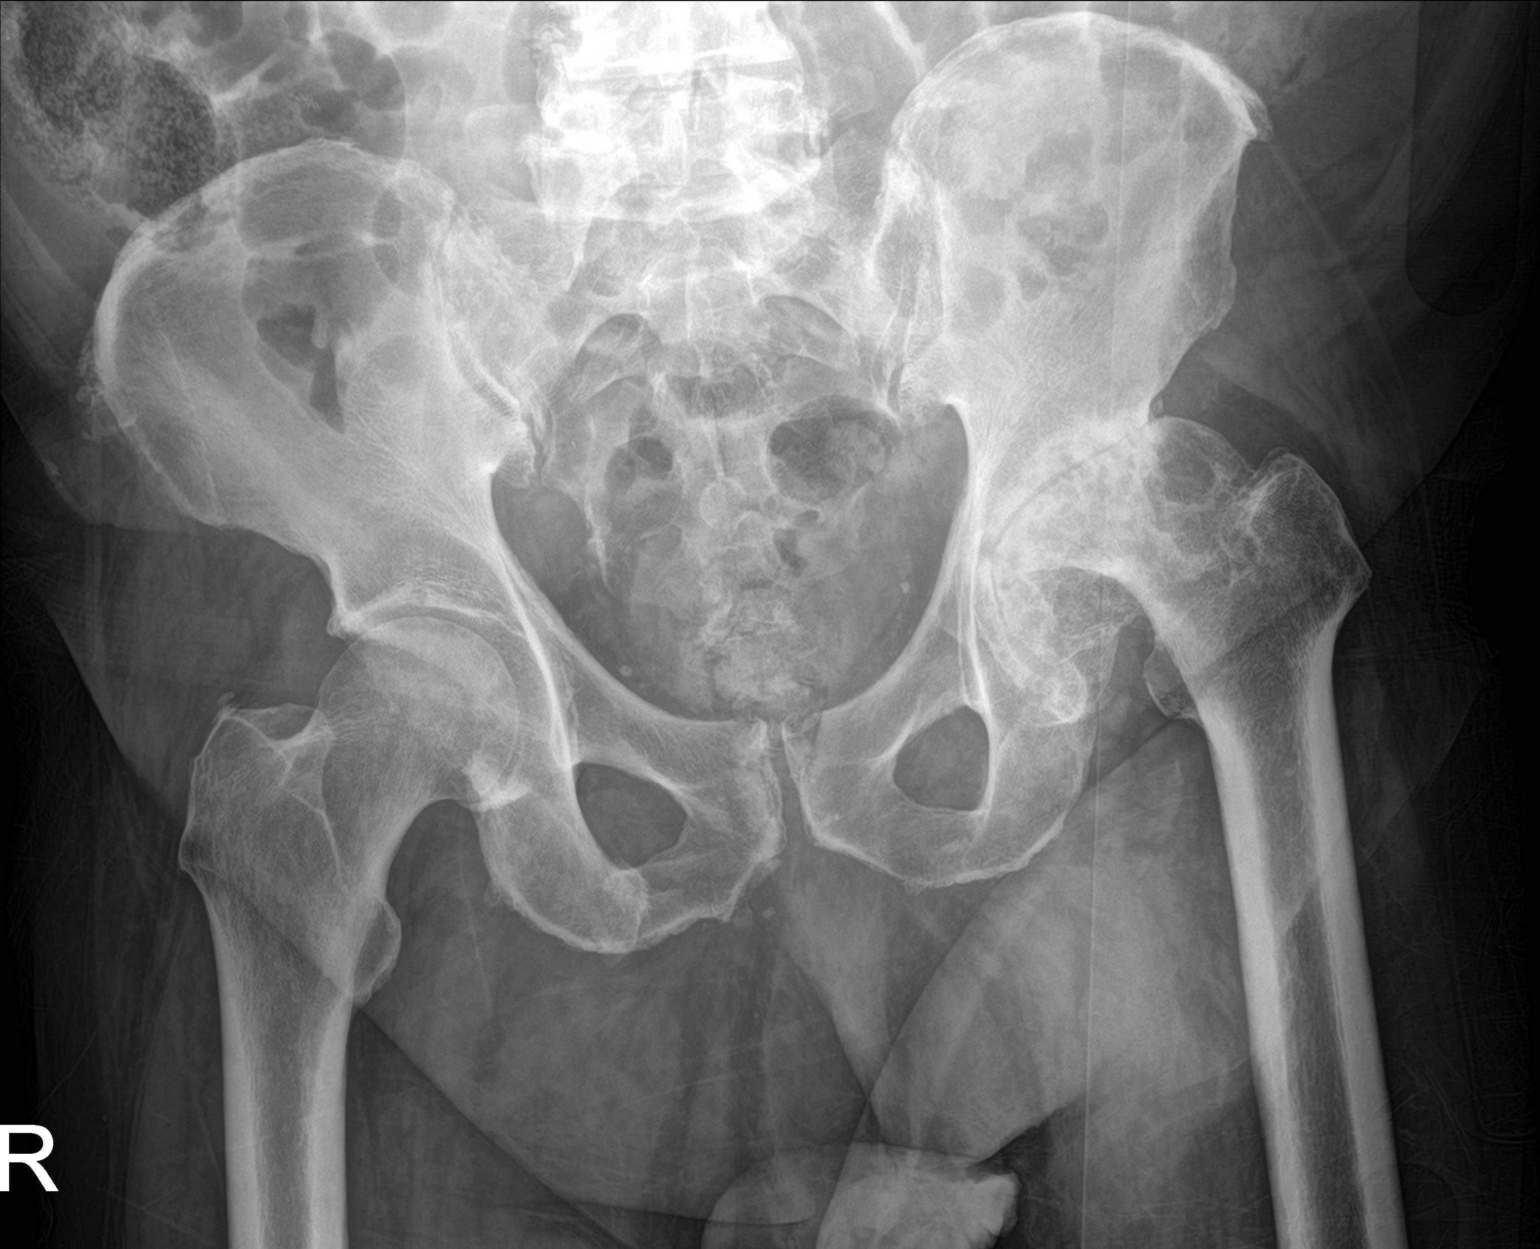

[3 of 3 positions shown; findings below may reference images not displayed]

FINDINGS: There is severe degenerative arthritis of the left hip with
remodeling of the femoral head and acetabulum, near complete loss of
the joint space, and subchondral sclerosis and cyst formation. No
fracture or dislocation. Limited evaluation of the right hip is
unremarkable. Soft tissues are unremarkable.
IMPRESSION: Severe left hip degenerative arthritis.  No fracture or dislocation.

## 2020-11-05 IMAGING — CT CT HEAD W/O CM
4 series · 14 of 47 positions shown, 16 images · non-contrast
Comparison: CT cervical spine [DATE]

CLINICAL DATA: Head injury

EXAM:
CT HEAD WITHOUT CONTRAST
CT CERVICAL SPINE WITHOUT CONTRAST
TECHNIQUE: Multidetector CT imaging of the head and cervical spine was
performed following the standard protocol without intravenous
contrast. Multiplanar CT image reconstructions of the cervical spine
were also generated.

[Series 3: head wo · axial · 0.45mm/px · z∈[-160,-35]mm · 7 of 35 slices shown, 9 images]
[im 5/35  brain]
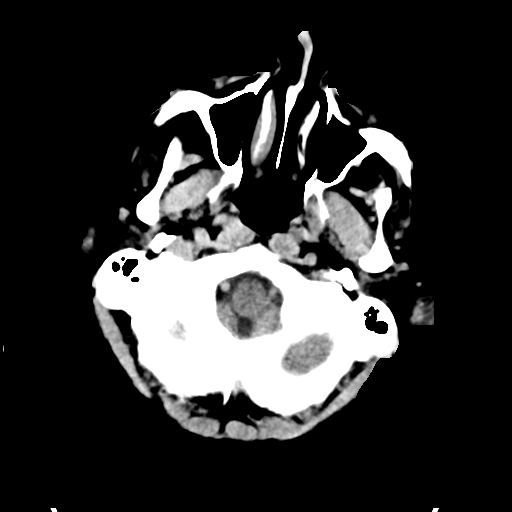
[im 5/35  bone]
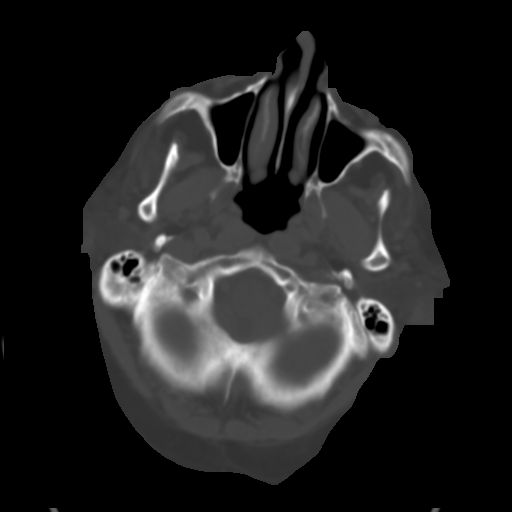
[im 9/35  brain]
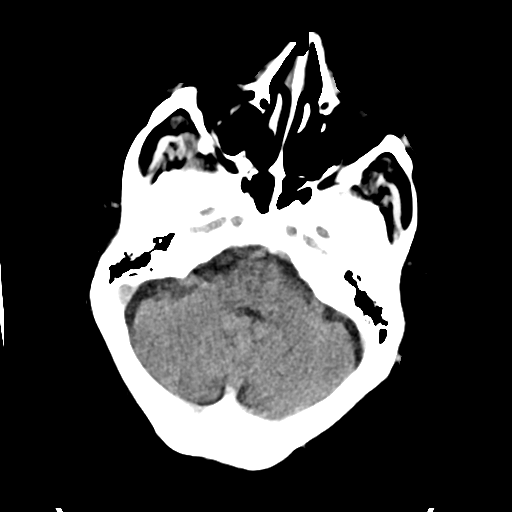
[im 13/35  brain]
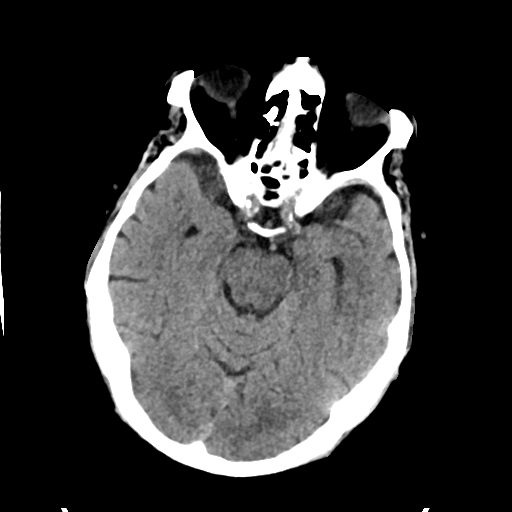
[im 18/35  brain]
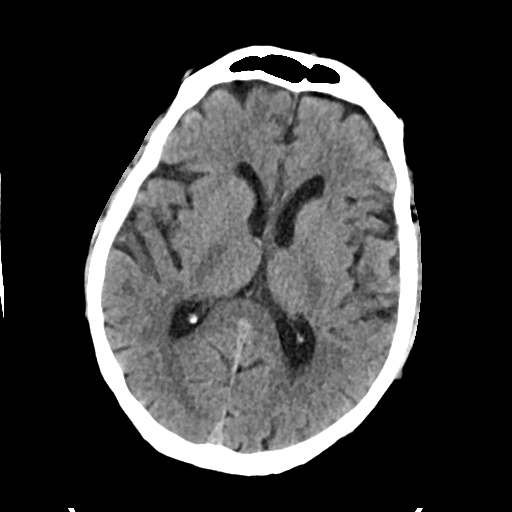
[im 22/35  brain]
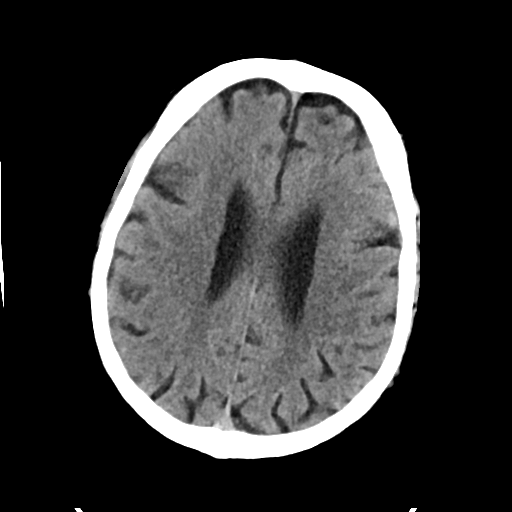
[im 22/35  bone]
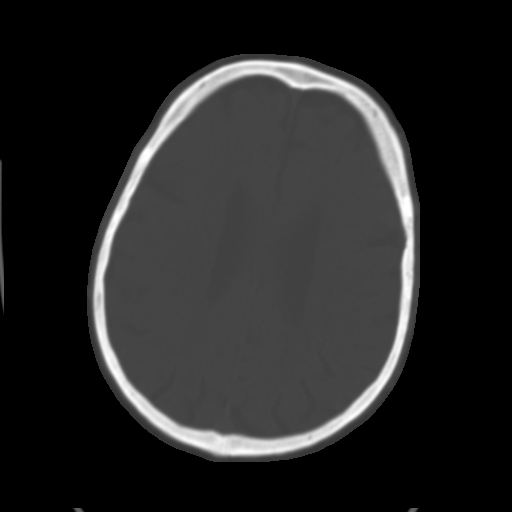
[im 26/35  brain]
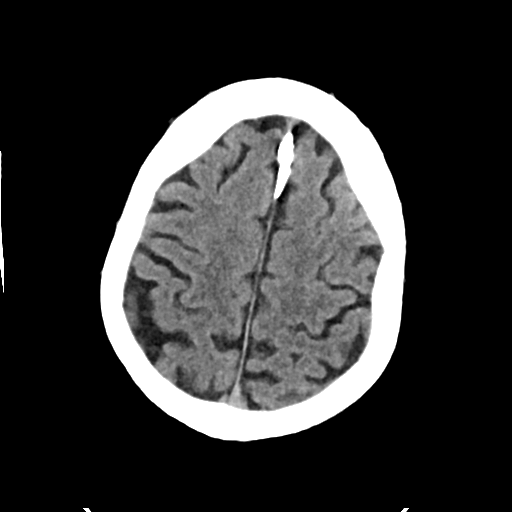
[im 30/35  brain]
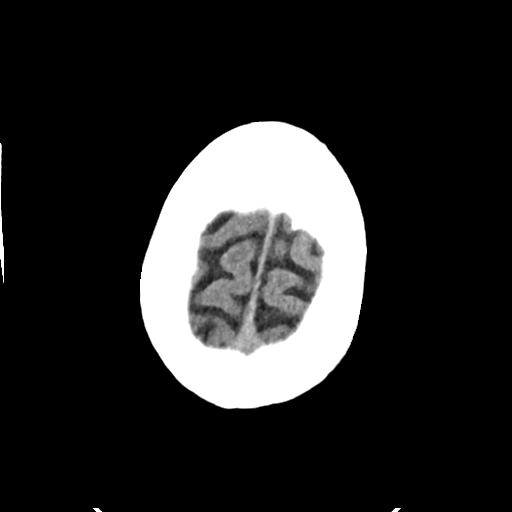

[Series 4: head bone · axial · 0.45mm/px · 1 of 87 slices shown]
[im 9/87  bone]
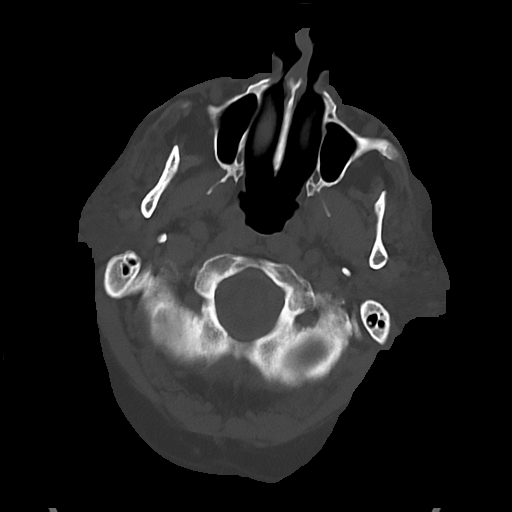

[Series 5: cor soft · coronal · 0.32mm/px · 3 of 87 slices shown]
[im 29/87  brain]
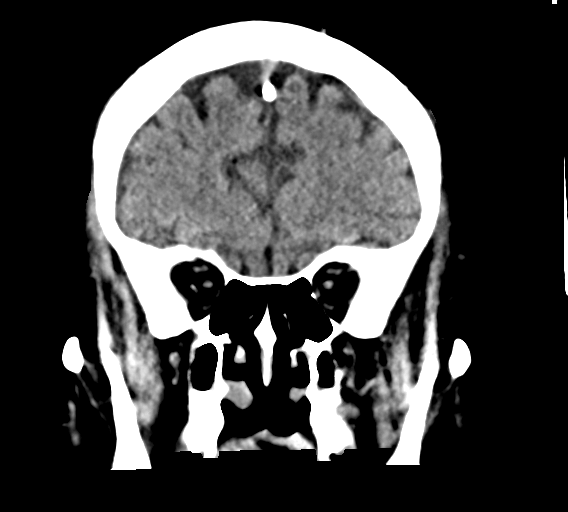
[im 39/87  brain]
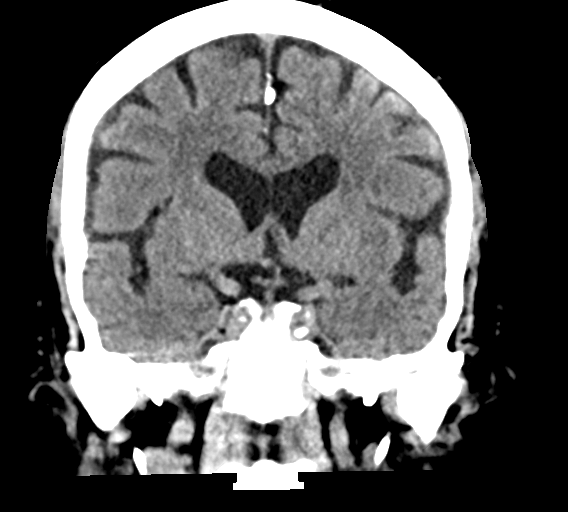
[im 48/87  brain]
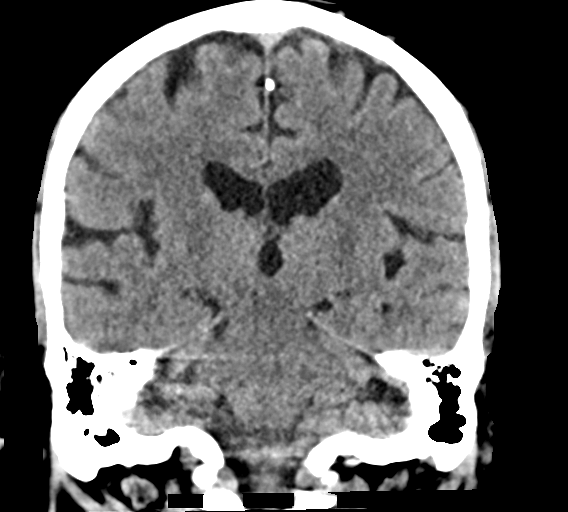

[Series 6: sag soft · sagittal · 0.32mm/px · 3 of 68 slices shown]
[im 23/68  brain]
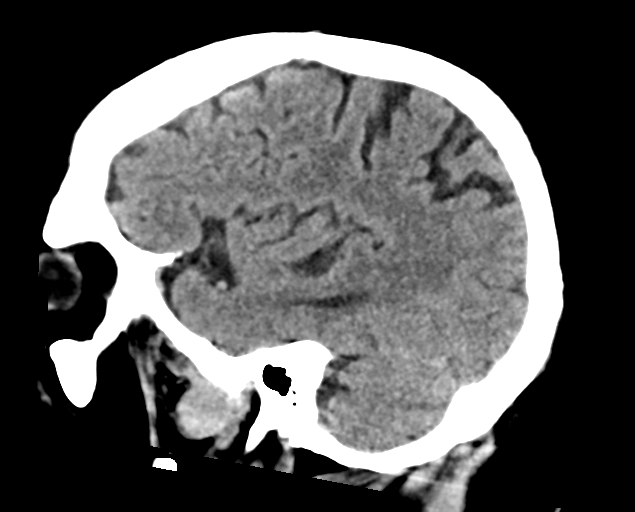
[im 34/68  brain]
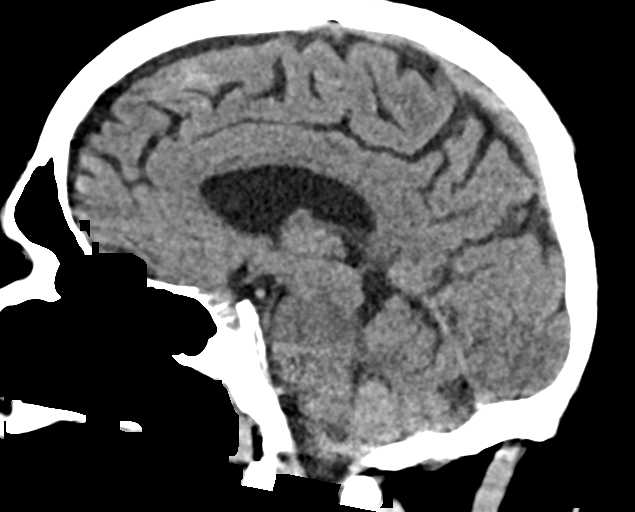
[im 45/68  brain]
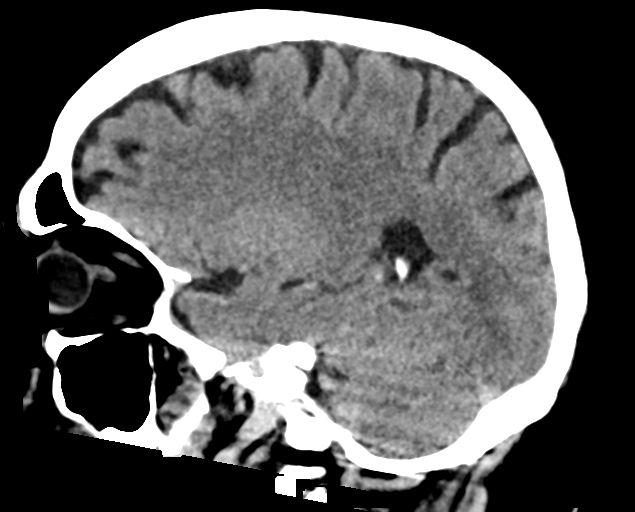

[14 of 47 positions shown; findings below may reference images not displayed]

FINDINGS: CT HEAD FINDINGS

Brain: Normal anatomic configuration. Parenchymal volume loss is
commensurate with the patient's age. No abnormal intra or
extra-axial mass lesion or fluid collection. No abnormal mass effect
or midline shift. No evidence of acute intracranial hemorrhage or
infarct. Ventricular size is normal. Cerebellum unremarkable.

Vascular: No asymmetric hyperdense vasculature at the skull base.

Skull: Intact

Sinuses/Orbits: Mild mucosal thickening within the left frontal
sinus and right sphenoid sinus. Small mucous retention cyst within
the visualized left maxillary sinus. No air-fluid levels. Remaining
paranasal sinuses are clear. Sinuses are clear. Orbits are
unremarkable.

Other: Mastoid air cells and middle ear cavities are clear.

CT CERVICAL SPINE FINDINGS

Alignment: There is moderate motion artifact which slightly limits
evaluation of the cervical spine. There is, again noted, reversal of
the normal cervical lordosis at C3-C7 with grade 2 anterolisthesis
of C3 upon C4 measuring approximately 5 mm, both unchanged. 3 mm
anterolisthesis of C4 on C5 also stable.

Skull base and vertebrae: The craniocervical alignment is normal.
The atlantodental interval is not widened. No definite acute
fracture of the cervical spine. Ankylosis of the left C2-3 facet is
again noted.

Soft tissues and spinal canal: There is moderate central canal
stenosis posterior to C4 secondary to anterolisthesis resulting in
mild flattening of the thecal sac. There is mild central canal
stenosis at C5-6 and C6-7 secondary to posterior disc osteophyte
complex ease resulting in mild flattening of the thecal sac at these
levels. No prevertebral soft tissue swelling. No paraspinal fluid
collections. No canal hematoma identified.

Disc levels: There is intervertebral disc space narrowing at C3-4
with exclusion of the disc material anteriorly. There is
intervertebral disc space narrowing and endplate remodeling at C5-C7
in keeping with changes of advanced degenerative disc disease.
Review of the axial images demonstrates severe bilateral knee
neuroforaminal narrowing at C3-4 secondary to anterolisthesis in
combination with advanced bilateral facet arthrosis. Moderate to
severe bilateral neuroforaminal narrowing is noted at C5-6 and to a
lesser extent, C6-7 secondary to uncovertebral arthrosis.

Upper chest: Unremarkable

Other: None
IMPRESSION: No acute intracranial abnormality.  No calvarial fracture.

Mild paranasal sinus disease.

No acute fracture or traumatic listhesis of the cervical spine.

Stable grade 2 anterolisthesis of C3 upon C4, likely related to
underlying facet arthrosis, with resultant moderate central canal
stenosis and severe bilateral neuroforaminal narrowing.

Stable changes of degenerative disc and degenerative joint disease
at C5-C7 resulting in mild central canal stenosis and
mild-to-moderate bilateral neuroforaminal narrowing.

## 2020-11-05 IMAGING — MR MR CERVICAL SPINE W/O CM
5 of 9 series · 18 of 48 positions shown · non-contrast
Comparison: Cervical spine CT [DATE] and MRI [DATE]

CLINICAL DATA: Worsening neck and back pain for 2 months
intermittently radiating to the arms and legs bilaterally with
paresthesias. Multiple recent falls.

EXAM:
MRI CERVICAL SPINE WITHOUT CONTRAST
TECHNIQUE: Multiplanar, multisequence MR imaging of the cervical spine was
performed. No intravenous contrast was administered.

[Series 3: T2 · sagittal · 3.0mm · 0.43mm/px · 3 of 18 slices shown (1 of 5)]
[im 1/18]
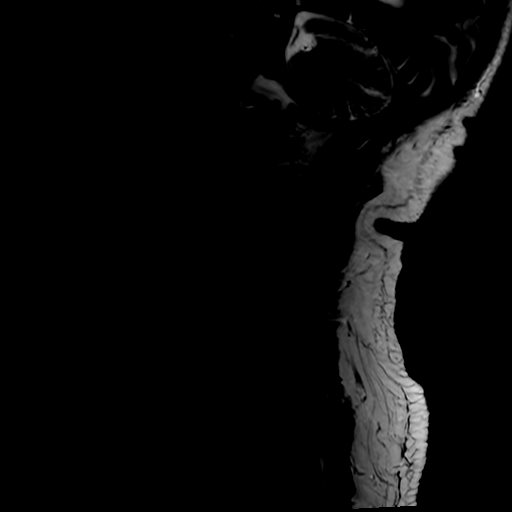
[im 9/18]
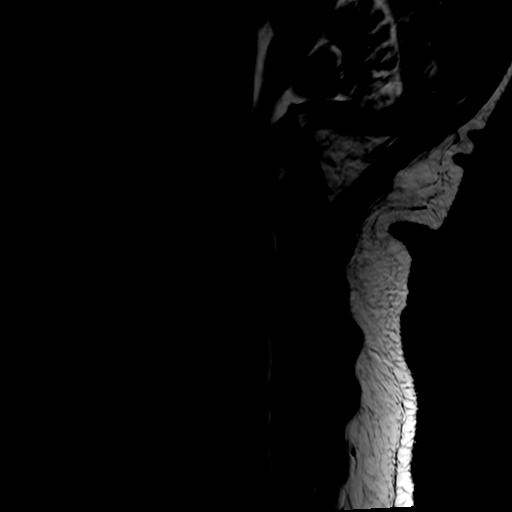
[im 18/18]
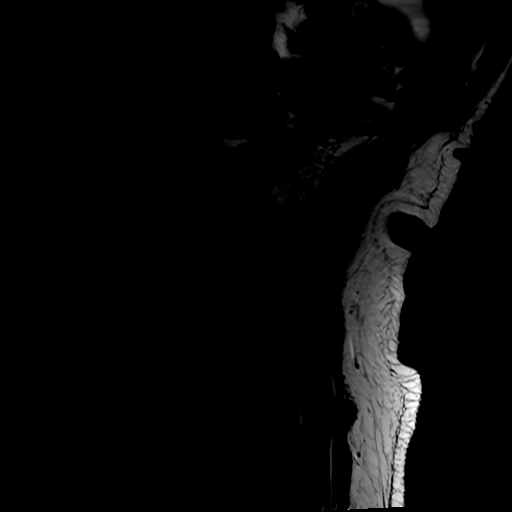

[Series 4: T2 · sagittal · 3.0mm · 0.43mm/px · 3 of 18 slices shown (2 of 5)]
[im 1/18]
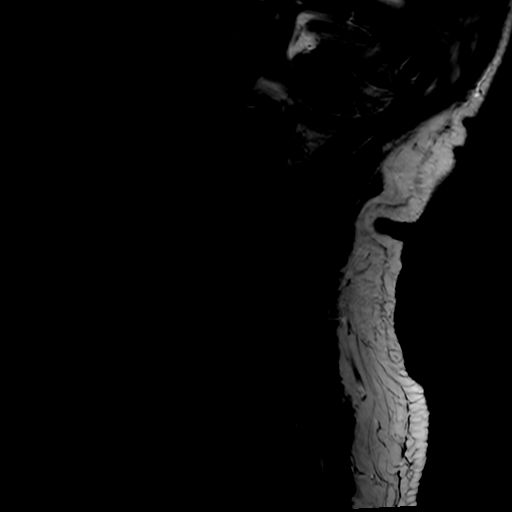
[im 9/18]
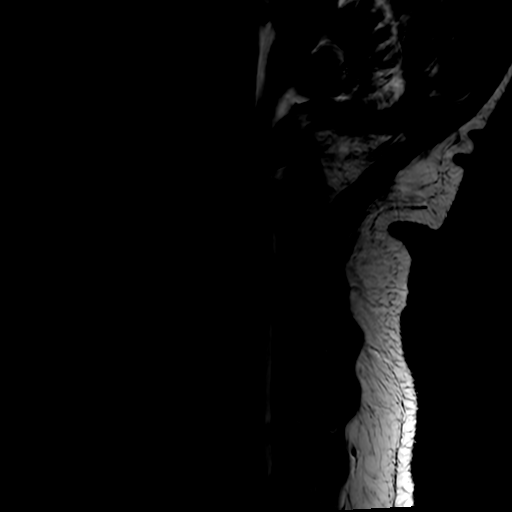
[im 18/18]
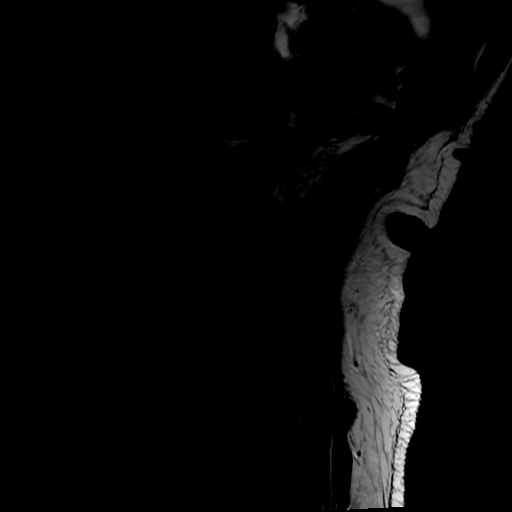

[Series 8: T2 · axial · 3.0mm · 0.35mm/px · z∈[-137,-61]mm · 5 of 27 slices shown (3 of 5)]
[im 1/27]
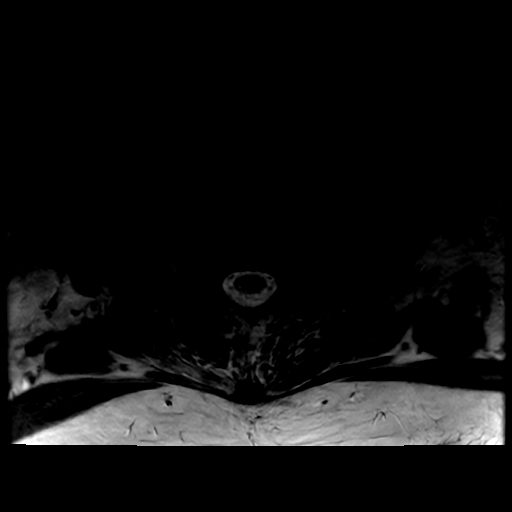
[im 7/27]
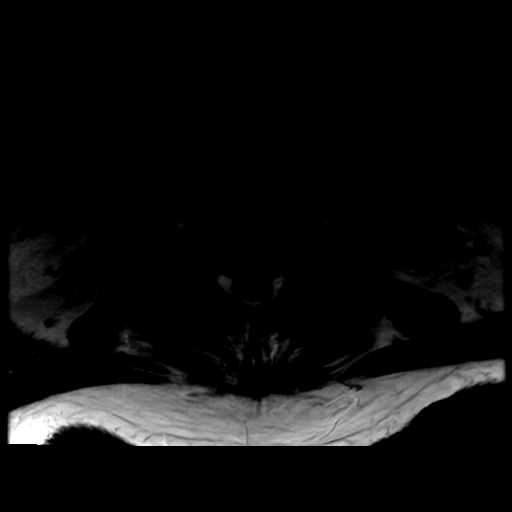
[im 14/27]
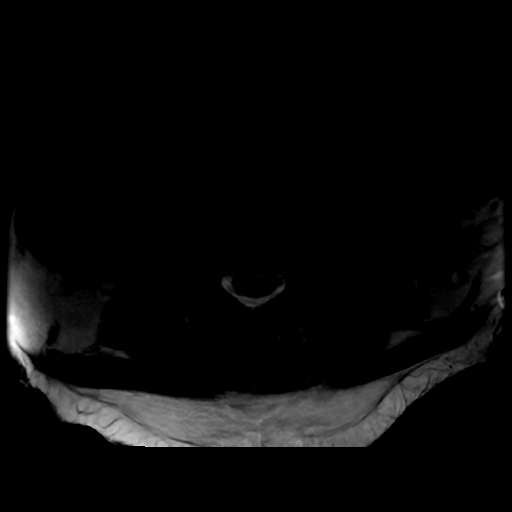
[im 20/27]
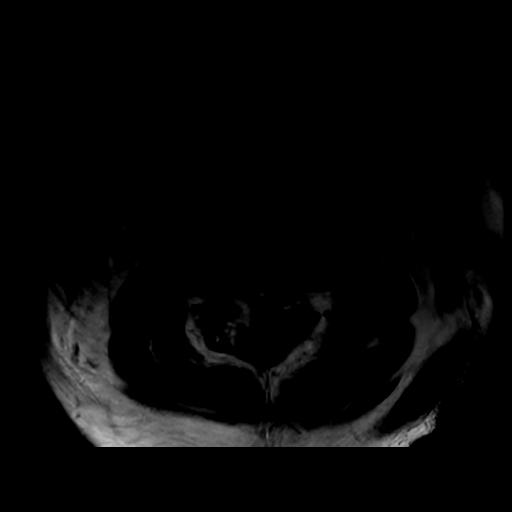
[im 27/27]
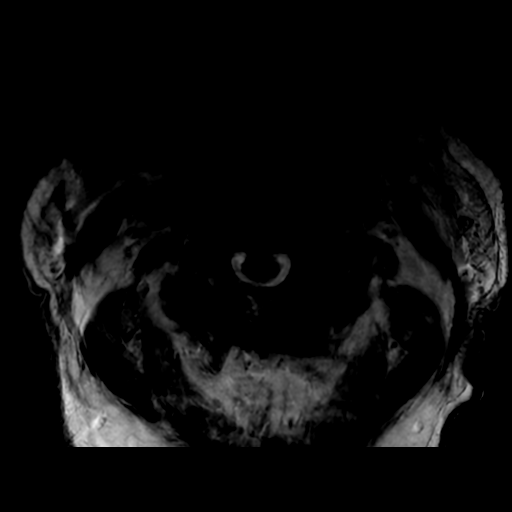

[Series 9: T2 · sagittal · 3.0mm · 0.43mm/px · 4 of 18 slices shown (4 of 5)]
[im 1/18]
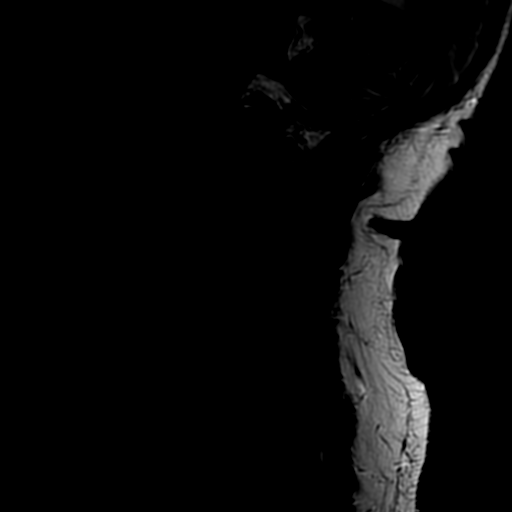
[im 6/18]
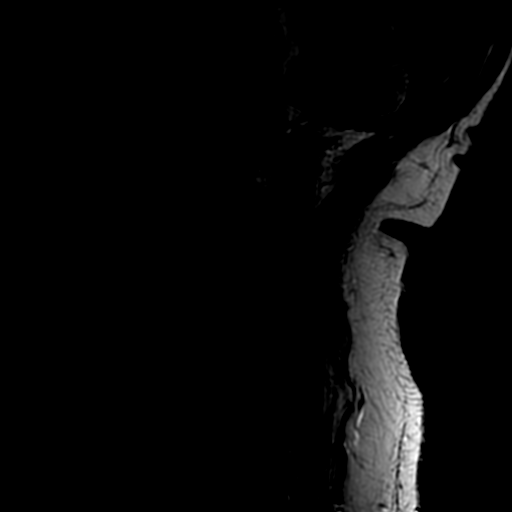
[im 12/18]
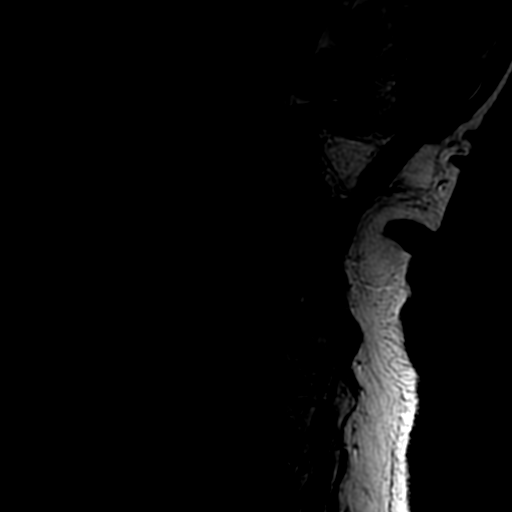
[im 18/18]
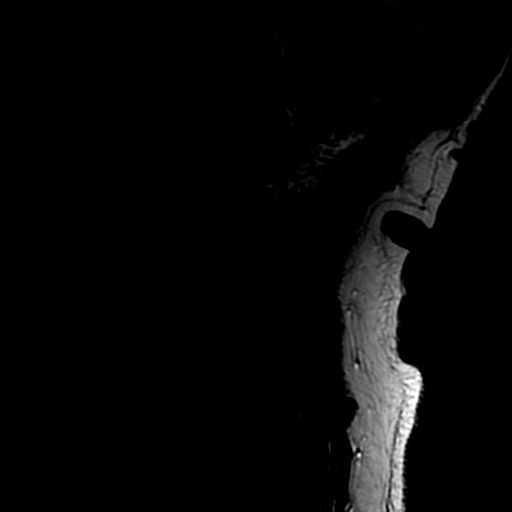

[Series 10: T2 · axial · 3.0mm · 0.35mm/px · z∈[-137,-99]mm · 3 of 27 slices shown (5 of 5)]
[im 1/27]
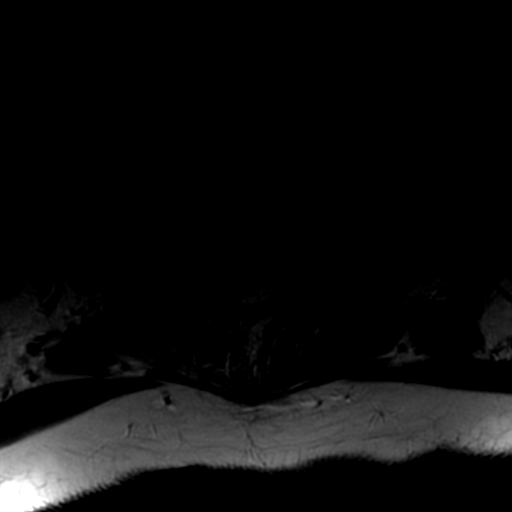
[im 7/27]
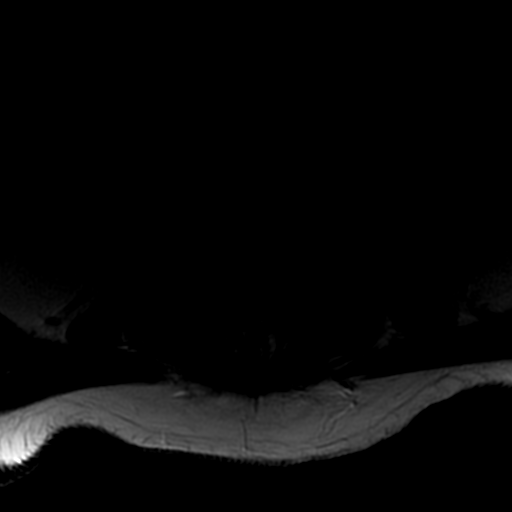
[im 14/27]
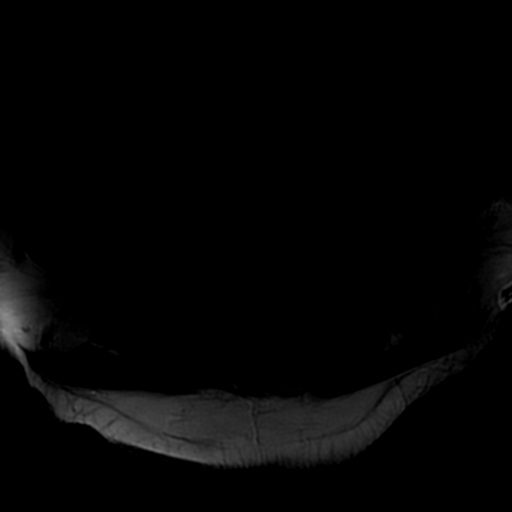

[18 of 48 positions shown; findings below may reference images not displayed]

FINDINGS: Multiple sequences are moderately to severely motion degraded
despite repeat imaging attempts.

Alignment: Reversal of the normal cervical lordosis with 6 mm
anterolisthesis of C3 on C4, new from the [Z9] MRI and likely
degenerative and facet mediated. Minimal anterolisthesis of C4 on
C5.

Vertebrae: No fracture or suspicious marrow lesion. Multilevel
degenerative endplate changes including mild edema most notable at
C3-4.

Cord: Limited assessment of the spinal cord due to motion artifact.
Faint STIR hyperintensity is questioned at C3-4.

Posterior Fossa, vertebral arteries, paraspinal tissues:
Unremarkable.

Disc levels:

C2-3: Moderate left facet arthrosis without evidence of significant
stenosis.

C3-4: Moderate to severe disc space narrowing. Anterolisthesis with
bulging uncovered disc, infolding of the ligamentum flavum, and
severe wall right greater than left facet arthrosis result in severe
spinal stenosis with moderate cord flattening and severe bilateral
neural foraminal stenosis.

C4-5: Anterolisthesis with disc uncovering and moderate facet
arthrosis result in at most mild bilateral neural foraminal stenosis
without significant spinal stenosis.

C5-6: Moderate to severe disc space narrowing. A broad-based
posterior disc osteophyte complex results in moderate spinal
stenosis with mild cord flattening and severe right and moderate
left neural foraminal stenosis.

C6-7: Moderate to severe disc space narrowing. A broad-based
posterior disc osteophyte complex results in mild spinal stenosis
and mild-to-moderate bilateral neural foraminal stenosis.

C7-T1: Mild facet arthrosis without evidence of significant
stenosis.

Disc and facet degeneration have progressed from the [Z9] MRI,
particularly at C3-4.
IMPRESSION: 1. Motion degraded examination.
2. Progressive multilevel cervical disc and facet degeneration from
[Z9], worst at C3-4 where there is grade 1 anterolisthesis, severe
spinal stenosis, and severe bilateral neural foraminal stenosis.
Moderate cord flattening with possible mild cord signal abnormality.
3. Moderate spinal stenosis and severe right and moderate left
neural foraminal stenosis at C5-6.
4. Mild spinal stenosis and mild-to-moderate neural foraminal
stenosis at C6-7.

## 2020-11-05 MED ORDER — APIXABAN 5 MG PO TABS
5.0000 mg | ORAL_TABLET | Freq: Two times a day (BID) | ORAL | Status: DC
  Administered 2020-11-05: 5 mg via ORAL
  Filled 2020-11-05: qty 1

## 2020-11-05 MED ORDER — OXYCODONE-ACETAMINOPHEN 5-325 MG PO TABS
1.0000 | ORAL_TABLET | Freq: Once | ORAL | Status: AC
  Administered 2020-11-05: 1 via ORAL
  Filled 2020-11-05: qty 1

## 2020-11-05 MED ORDER — FUROSEMIDE 10 MG/ML IJ SOLN
40.0000 mg | Freq: Two times a day (BID) | INTRAMUSCULAR | Status: DC
  Administered 2020-11-05 (×2): 40 mg via INTRAVENOUS
  Filled 2020-11-05 (×2): qty 4

## 2020-11-05 MED ORDER — LORAZEPAM 2 MG/ML IJ SOLN
1.0000 mg | Freq: Once | INTRAMUSCULAR | Status: AC | PRN
  Administered 2020-11-05: 1 mg via INTRAVENOUS
  Filled 2020-11-05: qty 1

## 2020-11-05 MED ORDER — ACETAMINOPHEN 325 MG PO TABS: 650.0000 mg | ORAL_TABLET | Freq: Four times a day (QID) | ORAL | Status: DC | PRN

## 2020-11-05 MED ORDER — POTASSIUM CHLORIDE CRYS ER 20 MEQ PO TBCR
40.0000 meq | EXTENDED_RELEASE_TABLET | Freq: Once | ORAL | Status: AC
  Administered 2020-11-05: 40 meq via ORAL
  Filled 2020-11-05: qty 2

## 2020-11-05 MED ORDER — ALLOPURINOL 300 MG PO TABS
300.0000 mg | ORAL_TABLET | Freq: Every day | ORAL | Status: DC
  Administered 2020-11-05: 300 mg via ORAL
  Filled 2020-11-05: qty 3

## 2020-11-05 MED ORDER — LOSARTAN POTASSIUM 50 MG PO TABS
100.0000 mg | ORAL_TABLET | Freq: Every day | ORAL | Status: DC
  Administered 2020-11-05: 100 mg via ORAL
  Filled 2020-11-05: qty 2

## 2020-11-05 MED ORDER — ACETAMINOPHEN 650 MG RE SUPP: 650.0000 mg | Freq: Four times a day (QID) | RECTAL | Status: DC | PRN

## 2020-11-05 MED ORDER — DHEA 25 MG PO CAPS: 25.0000 mg | ORAL_CAPSULE | Freq: Every day | ORAL | Status: DC

## 2020-11-05 MED ORDER — NAPROXEN 500 MG PO TABS: 500.0000 mg | ORAL_TABLET | Freq: Two times a day (BID) | ORAL | 0 refills | Status: AC

## 2020-11-05 MED ORDER — FUROSEMIDE 20 MG PO TABS: 40.0000 mg | ORAL_TABLET | Freq: Every day | ORAL | Status: DC

## 2020-11-05 MED ORDER — CARVEDILOL 12.5 MG PO TABS
12.5000 mg | ORAL_TABLET | Freq: Two times a day (BID) | ORAL | Status: DC
  Administered 2020-11-05: 12.5 mg via ORAL
  Filled 2020-11-05: qty 1

## 2020-11-05 MED ORDER — FENTANYL CITRATE (PF) 100 MCG/2ML IJ SOLN
75.0000 ug | Freq: Once | INTRAMUSCULAR | Status: AC
Start: 2020-11-05 — End: 2020-11-05
  Administered 2020-11-05: 75 ug via INTRAVENOUS
  Filled 2020-11-05: qty 2

## 2020-11-05 MED ORDER — PANTOPRAZOLE SODIUM 40 MG PO TBEC
40.0000 mg | DELAYED_RELEASE_TABLET | Freq: Every day | ORAL | Status: DC
  Administered 2020-11-05: 40 mg via ORAL
  Filled 2020-11-05: qty 1

## 2020-11-05 MED ORDER — SODIUM CHLORIDE 0.9 % IV SOLN
1.0000 g | Freq: Once | INTRAVENOUS | Status: AC
  Administered 2020-11-05: 1 g via INTRAVENOUS
  Filled 2020-11-05: qty 10

## 2020-11-05 MED ORDER — HYDROCODONE-ACETAMINOPHEN 5-325 MG PO TABS
1.0000 | ORAL_TABLET | ORAL | Status: DC | PRN
  Administered 2020-11-05: 2 via ORAL
  Filled 2020-11-05: qty 2

## 2020-11-05 NOTE — Progress Notes (Signed)
PHARMACIST - PHYSICIAN ORDER COMMUNICATION  CONCERNING: P&T Medication Policy on Herbal Medications  DESCRIPTION:  This patient's order for:  DHEA caps  has been noted.  This product(s) is classified as an "herbal" or natural product. Due to a lack of definitive safety studies or FDA approval, nonstandard manufacturing practices, plus the potential risk of unknown drug-drug interactions while on inpatient medications, the Pharmacy and Therapeutics Committee does not permit the use of "herbal" or natural products of this type within [facility].   ACTION TAKEN: The pharmacy department is unable to verify this order at this time and your patient has been informed of this safety policy. Please reevaluate patient's clinical condition at discharge and address if the herbal or natural product(s) should be resumed at that time.  Sherlon Handing, PharmD, BCPS Please see amion for complete clinical pharmacist phone list 11/05/2020 5:26 AM

## 2020-11-05 NOTE — ED Notes (Signed)
Pt placed on 2L for comfort due to feeling SOB and also receiving Fentanyl.

## 2020-11-05 NOTE — Plan of Care (Signed)

## 2020-11-05 NOTE — Discharge Instructions (Signed)
FOLLOW-UP INSTRUCTIONS:  Thank you for allowing [place] to be part of your care. You were hospitalized for Cervical radiculopathy.  Please follow up with the following providers: A. [name], MD, [place] / [place] [address], [phone]  Please note these changes made to your medications:   A. Medications to continue: Current Meds  Medication Sig   allopurinol (ZYLOPRIM) 300 MG tablet Take 300 mg by mouth daily.   apixaban (ELIQUIS) 5 MG TABS tablet Take 1 tablet (5 mg total) by mouth 2 (two) times daily.   carvedilol (COREG) 12.5 MG tablet Take 12.5 mg by mouth 2 (two) times daily with a meal.   DHEA 25 MG CAPS Take 25 mg by mouth daily.   fluticasone (FLONASE) 50 MCG/ACT nasal spray Place 1 spray into both nostrils daily as needed for rhinitis.    furosemide (LASIX) 40 MG tablet Take 1 tablet (40 mg total) by mouth daily.   ketoconazole (NIZORAL) 2 % cream Apply 1 application topically daily as needed for irritation.    loratadine (CLARITIN) 10 MG tablet Take 10 mg by mouth daily as needed for allergies.   losartan (COZAAR) 100 MG tablet Take 100 mg by mouth daily.   Multiple Vitamin (MULTIVITAMIN) tablet Take 1 tablet by mouth daily.   naproxen (NAPROSYN) 500 MG tablet Take 1 tablet (500 mg total) by mouth 2 (two) times daily with a meal for 14 days.   omeprazole (PRILOSEC) 20 MG capsule Take 20 mg by mouth daily as needed (for heartburn).    Potassium Chloride ER 20 MEQ TBCR Take 20 mEq by mouth 2 (two) times daily.   traMADol (ULTRAM) 50 MG tablet Take 50 mg by mouth daily as needed for moderate pain.       B. Medications to start: Naproxen 500 MG Tablet, take 1 tablet by mouth 2 (two) times daily with a meal for 14  days.   C. Medications to discontinue:  Potassium Chloride Tablets Tramadol 50 MG Tablet   Discharge recommendations:  Consider calling [facility] to set up an appointment to discuss surgical options for your spine pain.    Also consider asking your PCP for pain management options such as joint injections, and referral to orthopedic doctors and physical therapy.    Please call our clinic if you have any questions or concerns, we may be able to help and keep you from a long and expensive emergency room wait. Our clinic and after hours phone number is [phone], the best time to call is Monday through Friday 9 am to 4 pm but there is always someone available 24/7 if you have an emergency. If you need medication refills please notify your pharmacy one week in advance and they will send [place] a request.

## 2020-11-05 NOTE — ED Notes (Signed)
Report given to Marvetta Gibbons, RN of [phone]

## 2020-11-05 NOTE — Hospital Course (Addendum)
Logan [name] is a 75 year old male with persistent A-fib s/p ablation on Elliquis, diastolic HF, HTN, and HLD presenting with worsening of chronic back and neck pain, bilateral paraesthesias in upper and lower extremities, and LLL opacity on CXR.   Cervical Radiculopathy Presented with worsening of chronic neck and back pain over the last 2 months as well as bilateral paresthesias of the upper and lower extremities contributing to recurrent falls. CT Spine imaging was unremarkable except for degenerative disc disease. Cervical Spine MRI showed moderate cord flattening, severe spinal stenosis at C3-4 and mild to moderate stenosis at C5-6, C6-7 foramina. Pain was managed with tylenol-oxycodone PO.  He was transitioned to Naproxen and will follow-up with neurosurgery outpatient to discuss definitive options.   LLL Opacity on CXR Presented with a 3 day history of productive cough and a transient oxygen requirement of 2L [name] in the setting of LLL opacity concerning for pneumonia on CXR. Patient remained afebrile and had normal WBC. Procalcitonin was not elevated hence antibiotic treatment was not continued. Unlikely to be due to bacterial pneumonia. Patient was weaned to room air by the time of discharge.   Persistent Atrial Fibrillation; Diastolic Dysfunction Patient has a significant history of atrial fibrillation s/p numerous attempts of cardioversion and ablation (02/2020). Patient remained in A-fib with HR (50-70s) during the admission and was at his dry weight of 195 pounds. Patient was continued on home Carvedilol and Elliquis. He received IV Lasix 40 mg x1 with adequate urine output. He was euvolemic on exam. Discharged on home diuretic regimen.  Chronic Conditions: HLD: Continued home statin HTN: Continued home Losartan and Coreg

## 2020-11-05 NOTE — Progress Notes (Signed)
AVS provided and reviewed with pt. And son all questions answered.
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
## 2020-11-05 NOTE — ED Notes (Signed)
Attempted to call report to floor 

## 2020-11-05 NOTE — Discharge Summary (Signed)
Name: Logan [name] MRN: [id number] DOB: [birth year] 75 y.o. PCP: [name], MD  Date of Admission: 11/04/2020  9:11 PM Date of Discharge:  11/05/2020 Attending Physician: Dr. Angelia Mould  DISCHARGE DIAGNOSIS:  Primary Problem: Cervical radiculopathy   Hospital Problems: Principal Problem:   Cervical radiculopathy Active Problems:   Acute on chronic heart failure (HCC)    DISCHARGE MEDICATIONS:   Allergies as of 11/05/2020   No Known Allergies      Medication List     STOP taking these medications    potassium chloride SA 20 MEQ tablet Commonly known as: KLOR-CON   traMADol 50 MG tablet Commonly known as: ULTRAM       TAKE these medications    allopurinol 300 MG tablet Commonly known as: ZYLOPRIM Take 300 mg by mouth daily.   carvedilol 12.5 MG tablet Commonly known as: COREG Take 12.5 mg by mouth 2 (two) times daily with a meal.   DHEA 25 MG Caps Take 25 mg by mouth daily.   Eliquis 5 MG Tabs tablet Generic drug: apixaban Take 1 tablet (5 mg total) by mouth 2 (two) times daily.   fluticasone 50 MCG/ACT nasal spray Commonly known as: FLONASE Place 1 spray into both nostrils daily as needed for rhinitis.   furosemide 40 MG tablet Commonly known as: LASIX Take 1 tablet (40 mg total) by mouth daily.   ketoconazole 2 % cream Commonly known as: NIZORAL Apply 1 application topically daily as needed for irritation.   loratadine 10 MG tablet Commonly known as: CLARITIN Take 10 mg by mouth daily as needed for allergies.   losartan 100 MG tablet Commonly known as: COZAAR Take 100 mg by mouth daily.   multivitamin tablet Take 1 tablet by mouth daily.   naproxen 500 MG tablet Commonly known as: Naprosyn Take 1 tablet (500 mg total) by mouth 2 (two) times daily with a meal for 14 days.   omeprazole 20 MG capsule Commonly known as: PRILOSEC Take 20 mg by mouth daily as needed (for heartburn).   Potassium Chloride ER 20 MEQ Tbcr Take 20  mEq by mouth 2 (two) times daily.       ASK your doctor about these medications    Vitamin D (Ergocalciferol) 1.25 MG (50000 UNIT) Caps capsule Commonly known as: DRISDOL Take 1 capsule every 7 days. (Take one capsule weekly for 12 weeks)        DISPOSITION AND FOLLOW-UP:  Logan [name] was discharged from [facility] in Stable condition. At the hospital follow up visit please address:  Follow-up Recommendations: Consults: n/a Labs: n/a Studies: n/a Medications: n/a  Follow-up Appointments:  Follow-up Information     [facility]. Call.   Specialty: Neurosurgery Why: Please call to schedule an appointment. Contact information: [address] [place] [address] [phone]                 HOSPITAL COURSE:  Patient Summary: Logan [name] is a 75 year old male with persistent A-fib s/p ablation on Elliquis, diastolic HF, HTN, and HLD presenting with worsening of chronic back and neck pain, bilateral paraesthesias in upper and lower extremities, and LLL opacity on CXR.   Cervical Radiculopathy Presented with worsening of chronic neck and back pain over the last 2 months as well as bilateral paresthesias of the upper and lower extremities contributing to recurrent falls. CT Spine imaging was unremarkable except for degenerative disc disease. Cervical Spine MRI  showed moderate cord flattening, severe spinal stenosis at C3-4 and mild to moderate stenosis at C5-6, C6-7 foramina. Pain was managed with tylenol-oxycodone PO.  He was transitioned to Naproxen and will follow-up with neurosurgery outpatient to discuss definitive options.   LLL Opacity on CXR Presented with a 3 day history of productive cough and a transient oxygen requirement of 2L [place] in the setting of LLL opacity concerning for pneumonia on CXR. Patient remained afebrile and had normal WBC. Procalcitonin was not elevated hence antibiotic  treatment was not continued. Unlikely to be due to bacterial pneumonia. Patient was weaned to room air by the time of discharge.   Persistent Atrial Fibrillation; Diastolic Dysfunction Patient has a significant history of atrial fibrillation s/p numerous attempts of cardioversion and ablation (02/2020). Patient remained in A-fib with HR (50-70s) during the admission and was at his dry weight of 195 pounds. Patient was continued on home Carvedilol and Elliquis. He received IV Lasix 40 mg x1 with adequate urine output. He was euvolemic on exam. Discharged on home diuretic regimen.  Chronic Conditions: HLD: Continued home statin HTN: Continued home Losartan and Coreg   DISCHARGE INSTRUCTIONS:   Discharge Instructions     Diet - low sodium heart healthy   Complete by: As directed    Increase activity slowly   Complete by: As directed       FOLLOW-UP INSTRUCTIONS:  Thank you for allowing [place] to be part of your care. You were hospitalized for Cervical radiculopathy.  Please follow up with the following providers: A. [name], MD, [place] / [place] [address], [phone]  Please note these changes made to your medications:   A. Medications to continue: Current Meds  Medication Sig   allopurinol (ZYLOPRIM) 300 MG tablet Take 300 mg by mouth daily.   apixaban (ELIQUIS) 5 MG TABS tablet Take 1 tablet (5 mg total) by mouth 2 (two) times daily.   carvedilol (COREG) 12.5 MG tablet Take 12.5 mg by mouth 2 (two) times daily with a meal.   DHEA 25 MG CAPS Take 25 mg by mouth daily.   fluticasone (FLONASE) 50 MCG/ACT nasal spray Place 1 spray into both nostrils daily as needed for rhinitis.    furosemide (LASIX) 40 MG tablet Take 1 tablet (40 mg total) by mouth daily.   ketoconazole (NIZORAL) 2 % cream Apply 1 application topically daily as needed for irritation.    loratadine (CLARITIN) 10 MG tablet Take 10 mg by mouth daily as needed for allergies.   losartan (COZAAR) 100  MG tablet Take 100 mg by mouth daily.   Multiple Vitamin (MULTIVITAMIN) tablet Take 1 tablet by mouth daily.   naproxen (NAPROSYN) 500 MG tablet Take 1 tablet (500 mg total) by mouth 2 (two) times daily with a meal for 14 days.   omeprazole (PRILOSEC) 20 MG capsule Take 20 mg by mouth daily as needed (for heartburn).    Potassium Chloride ER 20 MEQ TBCR Take 20 mEq by mouth 2 (two) times daily.   traMADol (ULTRAM) 50 MG tablet Take 50 mg by mouth daily as needed for moderate pain.       B. Medications to start: Naproxen 500 MG Tablet, take 1 tablet by mouth 2 (two) times daily with a meal for 14  days.   C. Medications to discontinue:  Potassium Chloride Tablets Tramadol 50 MG Tablet   Discharge recommendations:  Consider calling [facility] to set up an appointment to discuss surgical options  for your spine pain.   Also consider asking your PCP for pain management options such as joint injections, and referral to orthopedic doctors and physical therapy.    Please call our clinic if you have any questions or concerns, we may be able to help and keep you from a long and expensive emergency room wait. Our clinic and after hours phone number is [phone], the best time to call is Monday through Friday 9 am to 4 pm but there is always someone available 24/7 if you have an emergency. If you need medication refills please notify your pharmacy one week in advance and they will send [place] a request.      SUBJECTIVE:   Logan [name] was admitted to the hospital overnight. He was seen at bedside this morning. He states his pain is improved with the pain medications he received in the ED. He also shares that his breathing is improving and that he is not on home oxygen. He endorses taking furosemide at home for leg swelling. He also endorses numbness in all 4 extremities. He denies fevers and night sweats. He is eager to go fishing with his grandchildren.   Discharge Vitals:    BP 135/77 (BP Location: Right Arm)   Pulse 70   Temp 97.6 F (36.4 C) (Oral)   Resp 18   SpO2 95%   OBJECTIVE:  Constitutional: weak-appearing male sitting propped up in hospital bed, in no acute distress HENT: normocephalic atraumatic, [place] out of place. Cardiovascular: irregular rhythm, normal rate, no m/r/g Pulmonary/Chest: normal work of breathing on room air MSK: normal bulk and tone, left lower extremity shorter than right, no gross deficits in strength in BLEs, cannot raise arms up past 90 degrees bilaterally Neurological: alert, answers questions appropriately, sensation on left upper and lower extremity intact Skin: bilateral feet cool to touch Psych: affect mood congruent   Pertinent Labs, Studies, and Procedures:  CBC Latest Ref Rng & Units 11/05/2020 10/08/2020 06/19/2020  WBC 4.0 - 10.5 K/uL 9.6 7.8 8.5  Hemoglobin 13.0 - 17.0 g/dL 13.8 13.1 13.7  Hematocrit 39.0 - 52.0 % 42.1 39.4 40.8  Platelets 150 - 400 K/uL 251 226 237    CMP Latest Ref Rng & Units 11/05/2020 10/08/2020 06/19/2020  Glucose 70 - 99 mg/dL 99 92 81  BUN 8 - 23 mg/dL 17 22 23   Creatinine 0.61 - 1.24 mg/dL 1.19 1.37(H) 1.06  Sodium 135 - 145 mmol/L 141 142 142  Potassium 3.5 - 5.1 mmol/L 3.5 4.5 4.2  Chloride 98 - 111 mmol/L 100 99 100  CO2 22 - 32 mmol/L 33(H) 29 27  Calcium 8.9 - 10.3 mg/dL 9.6 9.7 9.4  Total Protein 6.5 - 8.1 g/dL 6.0(L) - -  Total Bilirubin 0.3 - 1.2 mg/dL 0.7 - -  Alkaline Phos 38 - 126 U/L 63 - -  AST 15 - 41 U/L 20 - -  ALT 0 - 44 U/L 19 - -    CT Head Wo Contrast  Result Date: 11/05/2020 CLINICAL DATA:  Head injury EXAM: CT HEAD WITHOUT CONTRAST CT CERVICAL SPINE WITHOUT CONTRAST TECHNIQUE: Multidetector CT imaging of the head and cervical spine was performed following the standard protocol without intravenous contrast. Multiplanar CT image reconstructions of the cervical spine were also generated. COMPARISON:  CT cervical spine 11/02/2020 FINDINGS: CT HEAD FINDINGS Brain:  Normal anatomic configuration. Parenchymal volume loss is commensurate with the patient's age. No abnormal intra or extra-axial mass lesion or fluid collection. No abnormal mass effect or  midline shift. No evidence of acute intracranial hemorrhage or infarct. Ventricular size is normal. Cerebellum unremarkable. Vascular: No asymmetric hyperdense vasculature at the skull base. Skull: Intact Sinuses/Orbits: Mild mucosal thickening within the left frontal sinus and right sphenoid sinus. Small mucous retention cyst within the visualized left maxillary sinus. No air-fluid levels. Remaining paranasal sinuses are clear. Sinuses are clear. Orbits are unremarkable. Other: Mastoid air cells and middle ear cavities are clear. CT CERVICAL SPINE FINDINGS Alignment: There is moderate motion artifact which slightly limits evaluation of the cervical spine. There is, again noted, reversal of the normal cervical lordosis at C3-C7 with grade 2 anterolisthesis of C3 upon C4 measuring approximately 5 mm, both unchanged. 3 mm anterolisthesis of C4 on C5 also stable. Skull base and vertebrae: The craniocervical alignment is normal. The atlantodental interval is not widened. No definite acute fracture of the cervical spine. Ankylosis of the left C2-3 facet is again noted. Soft tissues and spinal canal: There is moderate central canal stenosis posterior to C4 secondary to anterolisthesis resulting in mild flattening of the thecal sac. There is mild central canal stenosis at C5-6 and C6-7 secondary to posterior disc osteophyte complex ease resulting in mild flattening of the thecal sac at these levels. No prevertebral soft tissue swelling. No paraspinal fluid collections. No canal hematoma identified. Disc levels: There is intervertebral disc space narrowing at C3-4 with exclusion of the disc material anteriorly. There is intervertebral disc space narrowing and endplate remodeling at A1-P3 in keeping with changes of advanced degenerative  disc disease. Review of the axial images demonstrates severe bilateral knee neuroforaminal narrowing at C3-4 secondary to anterolisthesis in combination with advanced bilateral facet arthrosis. Moderate to severe bilateral neuroforaminal narrowing is noted at C5-6 and to a lesser extent, C6-7 secondary to uncovertebral arthrosis. Upper chest: Unremarkable Other: None IMPRESSION: No acute intracranial abnormality.  No calvarial fracture. Mild paranasal sinus disease. No acute fracture or traumatic listhesis of the cervical spine. Stable grade 2 anterolisthesis of C3 upon C4, likely related to underlying facet arthrosis, with resultant moderate central canal stenosis and severe bilateral neuroforaminal narrowing. Stable changes of degenerative disc and degenerative joint disease at C5-C7 resulting in mild central canal stenosis and mild-to-moderate bilateral neuroforaminal narrowing. Electronically Signed   By: Fidela Salisbury MD   On: 11/05/2020 03:08   CT CERVICAL SPINE WO CONTRAST  Result Date: 11/05/2020 CLINICAL DATA:  Head injury EXAM: CT HEAD WITHOUT CONTRAST CT CERVICAL SPINE WITHOUT CONTRAST TECHNIQUE: Multidetector CT imaging of the head and cervical spine was performed following the standard protocol without intravenous contrast. Multiplanar CT image reconstructions of the cervical spine were also generated. COMPARISON:  CT cervical spine 11/02/2020 FINDINGS: CT HEAD FINDINGS Brain: Normal anatomic configuration. Parenchymal volume loss is commensurate with the patient's age. No abnormal intra or extra-axial mass lesion or fluid collection. No abnormal mass effect or midline shift. No evidence of acute intracranial hemorrhage or infarct. Ventricular size is normal. Cerebellum unremarkable. Vascular: No asymmetric hyperdense vasculature at the skull base. Skull: Intact Sinuses/Orbits: Mild mucosal thickening within the left frontal sinus and right sphenoid sinus. Small mucous retention cyst within the  visualized left maxillary sinus. No air-fluid levels. Remaining paranasal sinuses are clear. Sinuses are clear. Orbits are unremarkable. Other: Mastoid air cells and middle ear cavities are clear. CT CERVICAL SPINE FINDINGS Alignment: There is moderate motion artifact which slightly limits evaluation of the cervical spine. There is, again noted, reversal of the normal cervical lordosis at C3-C7 with grade 2 anterolisthesis of C3  upon C4 measuring approximately 5 mm, both unchanged. 3 mm anterolisthesis of C4 on C5 also stable. Skull base and vertebrae: The craniocervical alignment is normal. The atlantodental interval is not widened. No definite acute fracture of the cervical spine. Ankylosis of the left C2-3 facet is again noted. Soft tissues and spinal canal: There is moderate central canal stenosis posterior to C4 secondary to anterolisthesis resulting in mild flattening of the thecal sac. There is mild central canal stenosis at C5-6 and C6-7 secondary to posterior disc osteophyte complex ease resulting in mild flattening of the thecal sac at these levels. No prevertebral soft tissue swelling. No paraspinal fluid collections. No canal hematoma identified. Disc levels: There is intervertebral disc space narrowing at C3-4 with exclusion of the disc material anteriorly. There is intervertebral disc space narrowing and endplate remodeling at B0-F7 in keeping with changes of advanced degenerative disc disease. Review of the axial images demonstrates severe bilateral knee neuroforaminal narrowing at C3-4 secondary to anterolisthesis in combination with advanced bilateral facet arthrosis. Moderate to severe bilateral neuroforaminal narrowing is noted at C5-6 and to a lesser extent, C6-7 secondary to uncovertebral arthrosis. Upper chest: Unremarkable Other: None IMPRESSION: No acute intracranial abnormality.  No calvarial fracture. Mild paranasal sinus disease. No acute fracture or traumatic listhesis of the cervical  spine. Stable grade 2 anterolisthesis of C3 upon C4, likely related to underlying facet arthrosis, with resultant moderate central canal stenosis and severe bilateral neuroforaminal narrowing. Stable changes of degenerative disc and degenerative joint disease at C5-C7 resulting in mild central canal stenosis and mild-to-moderate bilateral neuroforaminal narrowing. Electronically Signed   By: Fidela Salisbury MD   On: 11/05/2020 03:08   MR Cervical Spine Wo Contrast  Result Date: 11/05/2020 CLINICAL DATA:  Worsening neck and back pain for 2 months intermittently radiating to the arms and legs bilaterally with paresthesias. Multiple recent falls. EXAM: MRI CERVICAL SPINE WITHOUT CONTRAST TECHNIQUE: Multiplanar, multisequence MR imaging of the cervical spine was performed. No intravenous contrast was administered. COMPARISON:  Cervical spine CT 11/05/2020 and MRI 08/03/2017 FINDINGS: Multiple sequences are moderately to severely motion degraded despite repeat imaging attempts. Alignment: Reversal of the normal cervical lordosis with 6 mm anterolisthesis of C3 on C4, new from the 2019 MRI and likely degenerative and facet mediated. Minimal anterolisthesis of C4 on C5. Vertebrae: No fracture or suspicious marrow lesion. Multilevel degenerative endplate changes including mild edema most notable at C3-4. Cord: Limited assessment of the spinal cord due to motion artifact. Faint STIR hyperintensity is questioned at C3-4. Posterior Fossa, vertebral arteries, paraspinal tissues: Unremarkable. Disc levels: C2-3: Moderate left facet arthrosis without evidence of significant stenosis. C3-4: Moderate to severe disc space narrowing. Anterolisthesis with bulging uncovered disc, infolding of the ligamentum flavum, and severe wall right greater than left facet arthrosis result in severe spinal stenosis with moderate cord flattening and severe bilateral neural foraminal stenosis. C4-5: Anterolisthesis with disc uncovering and  moderate facet arthrosis result in at most mild bilateral neural foraminal stenosis without significant spinal stenosis. C5-6: Moderate to severe disc space narrowing. A broad-based posterior disc osteophyte complex results in moderate spinal stenosis with mild cord flattening and severe right and moderate left neural foraminal stenosis. C6-7: Moderate to severe disc space narrowing. A broad-based posterior disc osteophyte complex results in mild spinal stenosis and mild-to-moderate bilateral neural foraminal stenosis. C7-T1: Mild facet arthrosis without evidence of significant stenosis. Disc and facet degeneration have progressed from the 2019 MRI, particularly at C3-4. IMPRESSION: 1. Motion degraded examination. 2. Progressive multilevel  cervical disc and facet degeneration from 2019, worst at C3-4 where there is grade 1 anterolisthesis, severe spinal stenosis, and severe bilateral neural foraminal stenosis. Moderate cord flattening with possible mild cord signal abnormality. 3. Moderate spinal stenosis and severe right and moderate left neural foraminal stenosis at C5-6. 4. Mild spinal stenosis and mild-to-moderate neural foraminal stenosis at C6-7. Electronically Signed   By: Logan Bores M.D.   On: 11/05/2020 09:39   DG Chest Portable 1 View  Result Date: 11/05/2020 CLINICAL DATA:  Shortness of breath EXAM: PORTABLE CHEST 1 VIEW COMPARISON:  05/03/2019 FINDINGS: Patchy left lower lobe opacity, atelectasis versus pneumonia, with associated small left pleural effusion. No frank interstitial edema.  No pneumothorax. The heart is normal in size.  Thoracic aortic atherosclerosis. IMPRESSION: Patchy left lower lobe opacity, atelectasis versus pneumonia. Associated small left pleural effusion. Electronically Signed   By: Julian Hy M.D.   On: 11/05/2020 02:07   DG Hip Unilat W or Wo Pelvis 2-3 Views Left  Result Date: 11/05/2020 CLINICAL DATA:  Left hip pain EXAM: DG HIP (WITH OR WITHOUT PELVIS) 2-3V  LEFT COMPARISON:  None. FINDINGS: There is severe degenerative arthritis of the left hip with remodeling of the femoral head and acetabulum, near complete loss of the joint space, and subchondral sclerosis and cyst formation. No fracture or dislocation. Limited evaluation of the right hip is unremarkable. Soft tissues are unremarkable. IMPRESSION: Severe left hip degenerative arthritis.  No fracture or dislocation. Electronically Signed   By: Fidela Salisbury MD   On: 11/05/2020 02:49     Ok Edwards, MS4 5:06 PM, 11/05/2020

## 2020-11-05 NOTE — ED Notes (Signed)
Patient transported to CT 

## 2020-11-05 NOTE — ED Provider Notes (Signed)
[facility] EMERGENCY DEPARTMENT Provider Note   CSN: [id number] Arrival date & time: 11/04/20  2102     History Chief Complaint  Patient presents with   Arms.Legs,Back and Neck pain     Logan [name] is a 75 y.o. male with a history of atrial fibrillation on Eliquis, chronic diastolic heart failure, diabetes mellitus, DVT, HTN, gout, hyperlipidemia, obesity who presents to the emergency department with a chief complaint of neck pain.  The patient has constant, worsening sharp, shooting pain in his neck and back that will intermittently radiate down his bilateral arms and bilateral legs.  He reports associated paresthesias in his bilateral hands and feet.  He reports associated weakness.  His fiance adds that he can no longer grip a cup with one hand and now has to hold a cup with 2 hands.  The patient states that he cannot lift his shoulders above 90 degrees due to his symptoms.  Over the last 2 months, he has had increased difficulty walking.  He can previously ambulate with a cane, but is now using a walker.  He has had multiple falls since his symptoms worsened.  Last fall was 4 days ago when he tripped and fell while using his walker.  He hit his head during the fall, but denies loss of consciousness.  He is also endorsing left hip pain, which he states is chronic, but has worsened since he has had recurrent falls.  He does report that he has had some mild associated shortness of breath.  His fiance at bedside reports that he has had a worsening productive cough with dark sputum.  No chest pain.  No headache, dizziness, lightheadedness, abdominal pain, vomiting, diarrhea, fever, chills, dysuria, urinary frequency, hematuria, or urinary or fecal incontinence.   He lives alone.  Reports that he is followed by an orthopedist in [place] who stated that he had bone spurs on his spine.  He is followed by cardiology.  Cardiology attempted cardioversion for atrial fibrillation on  June 30 unsuccessfully.  He was advised by cardiology to discontinue amiodarone as there was concern that his symptoms may be due to amiodarone toxicity impacting both his gait balance and muscle weakness.  He was also advised to stop taking his statin for muscle weakness and discontinuing Carduara as these side effects may be causing edema and orthostatic shifts.  Patient is a poor historian.  The history is provided by the patient, a relative and medical records. No language interpreter was used.      Past Medical History:  Diagnosis Date   Anxiety    Atherosclerotic renal artery stenosis, unilateral (HCC) 02/28/2014   Atrial fibrillation (HCC)    Bilateral lower extremity edema    Chronic diastolic heart failure ([place]) 10/21/2019   Diabetes mellitus without complication (HCC)    DVT (deep venous thrombosis) (HCC)    Esophageal reflux    Essential hypertension 10/21/2019   Gout    Hyperlipidemia    Hypertension    Mixed hyperlipidemia 10/21/2019   Morbid obesity ([place])    Obesity (BMI 30-39.9) 08/24/2019   Orthopnea    Osteoarthritis    Persistent atrial fibrillation ([place]) 10/21/2019   Seasonal allergies    Sleep apnea     Patient Active Problem List   Diagnosis Date Noted   Seasonal allergies    Orthopnea    Morbid obesity ([place])    DVT (deep venous thrombosis) (HCC)    Bilateral lower extremity edema    Secondary hypercoagulable  state ([place]) 03/28/2020   Persistent atrial fibrillation ([place]) 10/21/2019   Essential hypertension 10/21/2019   Chronic diastolic heart failure ([place]) 10/21/2019   Mixed hyperlipidemia 10/21/2019   Obesity (BMI 30-39.9) 08/24/2019   Sleep apnea    Osteoarthritis    Hypertension    Hyperlipidemia    Gout    Esophageal reflux    Diabetes mellitus without complication [facility])    Atrial fibrillation (HCC)    Anxiety    Atherosclerotic renal artery stenosis, unilateral ([place]) 02/28/2014    Past Surgical History:  Procedure Laterality Date   ABDOMINAL  AORTAGRAM N/A 02/28/2014   Procedure: ABDOMINAL Maxcine Ham;  Surgeon: Serafina Mitchell, MD;  Location: [facility] CATH LAB;  Service: Cardiovascular;  Laterality: N/A;   APPENDECTOMY     ATRIAL FIBRILLATION ABLATION N/A 02/29/2020   Procedure: ATRIAL FIBRILLATION ABLATION;  Surgeon: Constance Haw, MD;  Location: [place] CV LAB;  Service: Cardiovascular;  Laterality: N/A;   CARDIOVERSION N/A 10/18/2020   Procedure: CARDIOVERSION (CATH LAB);  Surgeon: Constance Haw, MD;  Location: [place] CV LAB;  Service: Cardiovascular;  Laterality: N/A;   CARPAL TUNNEL RELEASE Left        Family History  Problem Relation Age of Onset   Heart disease Mother    Cancer Father        throat cancer    Social History   Tobacco Use   Smoking status: Former   Smokeless tobacco: Former    Types: Chew  Substance Use Topics   Alcohol use: Never   Drug use: Never    Home Medications Prior to Admission medications   Medication Sig Start Date End Date Taking? Authorizing Provider  allopurinol (ZYLOPRIM) 300 MG tablet Take 300 mg by mouth daily.    [provider]  apixaban (ELIQUIS) 5 MG TABS tablet Take 1 tablet (5 mg total) by mouth 2 (two) times daily. 09/20/20   Camnitz, Ocie Doyne, MD  carvedilol (COREG) 12.5 MG tablet Take 12.5 mg by mouth 2 (two) times daily with a meal.    [provider]  DHEA 25 MG CAPS Take 25 mg by mouth daily.    [provider]  fluticasone (FLONASE) 50 MCG/ACT nasal spray Place 1 spray into both nostrils daily as needed for rhinitis.     [provider]  furosemide (LASIX) 40 MG tablet Take 1 tablet (40 mg total) by mouth daily. 10/26/20 01/24/21  Richardo Priest, MD  ketoconazole (NIZORAL) 2 % cream Apply 1 application topically daily as needed for irritation.     [provider]  loratadine (CLARITIN) 10 MG tablet Take 10 mg by mouth daily as needed for allergies.    [provider]  losartan (COZAAR) 100 MG  tablet Take 100 mg by mouth daily.    [provider]  meloxicam (MOBIC) 7.5 MG tablet Take 7.5 mg by mouth 2 (two) times daily as needed.  06/29/19   [provider]  omeprazole (PRILOSEC) 20 MG capsule Take 20 mg by mouth daily as needed (for heartburn).     [provider]  potassium chloride SA (KLOR-CON) 20 MEQ tablet Take 1 tablet (20 mEq total) by mouth daily. 08/02/20 10/31/20  Tobb, Kardie, DO  traMADol (ULTRAM) 50 MG tablet Take 50 mg by mouth daily as needed for moderate pain.  06/15/19   [provider]  vitamin B-12 (CYANOCOBALAMIN) 1000 MCG tablet Take 1,000 mcg by mouth daily.    [provider]  Vitamin D,  Ergocalciferol, (DRISDOL) 1.25 MG (50000 UNIT) CAPS capsule Take 1 capsule every 7 days. (Take one capsule weekly for 12 weeks) 05/24/20   Tobb, Kardie, DO    Allergies    Patient has no known allergies.  Review of Systems   Review of Systems  Constitutional:  Negative for appetite change, chills and fever.  HENT:  Negative for congestion, sore throat, trouble swallowing and voice change.   Respiratory:  Positive for cough and shortness of breath. Negative for wheezing.   Cardiovascular:  Negative for chest pain, palpitations and leg swelling.  Gastrointestinal:  Negative for abdominal pain, blood in stool, constipation, diarrhea, nausea and vomiting.  Genitourinary:  Negative for dysuria, flank pain, frequency, hematuria, penile pain and urgency.  Musculoskeletal:  Positive for arthralgias, back pain, gait problem, myalgias and neck pain. Negative for joint swelling and neck stiffness.  Skin:  Positive for wound. Negative for rash.  Allergic/Immunologic: Negative for immunocompromised state.  Neurological:  Positive for weakness. Negative for dizziness, seizures, syncope, light-headedness, numbness and headaches.  Psychiatric/Behavioral:  Negative for confusion.    Physical Exam Updated Vital Signs BP (!) 162/79   Pulse 69   Temp  98.8 F (37.1 C)   Resp 17   SpO2 98%   Physical Exam Vitals and nursing note reviewed.  Constitutional:      General: He is not in acute distress.    Appearance: He is not ill-appearing.  HENT:     Nose: Nose normal.  Cardiovascular:     Rate and Rhythm: Rhythm irregular.     Pulses: Normal pulses.     Heart sounds: Normal heart sounds. No murmur heard.   No friction rub. No gallop.  Pulmonary:     Effort: No respiratory distress.     Breath sounds: No wheezing or rhonchi.     Comments: Petechial rash noted to the right chest wall.  Patient states that this has been present since his fall on Thursday.  Chest wall is nontender.  No crepitus or step-offs. Abdominal:     General: There is no distension.     Palpations: There is no mass.     Tenderness: There is no abdominal tenderness. There is no right CVA tenderness, left CVA tenderness, guarding or rebound.     Hernia: No hernia is present.  Musculoskeletal:     Right lower leg: No edema.     Left lower leg: No edema.     Comments: Left leg is shortened when compared to the right.  There are some diffuse tenderness to palpation over the left hip.  No tenderness to the right knee or ankle.  Positive straight leg raise on the left.  Negative straight leg raise on the right.  Mild tenderness palpation to the spinous processes of the cervical spine without crepitus or step-offs.  Thoracic and lumbar spine are nontender.  DP and PT pulses are 2+ and symmetric.  Radial pulses are symmetric.  Sensation is intact to light touch to the bilateral upper and lower extremities.  Good grip strength bilaterally to the UE.  Good strength against resistance with dorsiflexion and plantarflexion.  Skin:    Comments: Multiple healing superficial abrasions noted to the right forehead, bilateral arms, right lower leg.  Neurological:     Comments: Cranial nerves II through XII are grossly intact.    ED Results / Procedures / Treatments   Labs (all  labs ordered are listed, but only abnormal results are displayed) Labs Reviewed  CBC WITH  DIFFERENTIAL/PLATELET - Abnormal; Notable for the following components:      Result Value   RBC 4.03 (*)    MCV 104.5 (*)    MCH 34.2 (*)    All other components within normal limits  COMPREHENSIVE METABOLIC PANEL - Abnormal; Notable for the following components:   CO2 33 (*)    Total Protein 6.0 (*)    All other components within normal limits  BRAIN NATRIURETIC PEPTIDE - Abnormal; Notable for the following components:   B Natriuretic Peptide 111.3 (*)    All other components within normal limits  RESP PANEL BY RT-PCR (FLU A&B, COVID) ARPGX2  URINALYSIS, ROUTINE W REFLEX MICROSCOPIC    EKG None  Radiology CT Head Wo Contrast  Result Date: 11/05/2020 CLINICAL DATA:  Head injury EXAM: CT HEAD WITHOUT CONTRAST CT CERVICAL SPINE WITHOUT CONTRAST TECHNIQUE: Multidetector CT imaging of the head and cervical spine was performed following the standard protocol without intravenous contrast. Multiplanar CT image reconstructions of the cervical spine were also generated. COMPARISON:  CT cervical spine 11/02/2020 FINDINGS: CT HEAD FINDINGS Brain: Normal anatomic configuration. Parenchymal volume loss is commensurate with the patient's age. No abnormal intra or extra-axial mass lesion or fluid collection. No abnormal mass effect or midline shift. No evidence of acute intracranial hemorrhage or infarct. Ventricular size is normal. Cerebellum unremarkable. Vascular: No asymmetric hyperdense vasculature at the skull base. Skull: Intact Sinuses/Orbits: Mild mucosal thickening within the left frontal sinus and right sphenoid sinus. Small mucous retention cyst within the visualized left maxillary sinus. No air-fluid levels. Remaining paranasal sinuses are clear. Sinuses are clear. Orbits are unremarkable. Other: Mastoid air cells and middle ear cavities are clear. CT CERVICAL SPINE FINDINGS Alignment: There is moderate  motion artifact which slightly limits evaluation of the cervical spine. There is, again noted, reversal of the normal cervical lordosis at C3-C7 with grade 2 anterolisthesis of C3 upon C4 measuring approximately 5 mm, both unchanged. 3 mm anterolisthesis of C4 on C5 also stable. Skull base and vertebrae: The craniocervical alignment is normal. The atlantodental interval is not widened. No definite acute fracture of the cervical spine. Ankylosis of the left C2-3 facet is again noted. Soft tissues and spinal canal: There is moderate central canal stenosis posterior to C4 secondary to anterolisthesis resulting in mild flattening of the thecal sac. There is mild central canal stenosis at C5-6 and C6-7 secondary to posterior disc osteophyte complex ease resulting in mild flattening of the thecal sac at these levels. No prevertebral soft tissue swelling. No paraspinal fluid collections. No canal hematoma identified. Disc levels: There is intervertebral disc space narrowing at C3-4 with exclusion of the disc material anteriorly. There is intervertebral disc space narrowing and endplate remodeling at Q2-I2 in keeping with changes of advanced degenerative disc disease. Review of the axial images demonstrates severe bilateral knee neuroforaminal narrowing at C3-4 secondary to anterolisthesis in combination with advanced bilateral facet arthrosis. Moderate to severe bilateral neuroforaminal narrowing is noted at C5-6 and to a lesser extent, C6-7 secondary to uncovertebral arthrosis. Upper chest: Unremarkable Other: None IMPRESSION: No acute intracranial abnormality.  No calvarial fracture. Mild paranasal sinus disease. No acute fracture or traumatic listhesis of the cervical spine. Stable grade 2 anterolisthesis of C3 upon C4, likely related to underlying facet arthrosis, with resultant moderate central canal stenosis and severe bilateral neuroforaminal narrowing. Stable changes of degenerative disc and degenerative joint  disease at C5-C7 resulting in mild central canal stenosis and mild-to-moderate bilateral neuroforaminal narrowing. Electronically Signed   By:  Fidela Salisbury MD   On: 11/05/2020 03:08   CT CERVICAL SPINE WO CONTRAST  Result Date: 11/05/2020 CLINICAL DATA:  Head injury EXAM: CT HEAD WITHOUT CONTRAST CT CERVICAL SPINE WITHOUT CONTRAST TECHNIQUE: Multidetector CT imaging of the head and cervical spine was performed following the standard protocol without intravenous contrast. Multiplanar CT image reconstructions of the cervical spine were also generated. COMPARISON:  CT cervical spine 11/02/2020 FINDINGS: CT HEAD FINDINGS Brain: Normal anatomic configuration. Parenchymal volume loss is commensurate with the patient's age. No abnormal intra or extra-axial mass lesion or fluid collection. No abnormal mass effect or midline shift. No evidence of acute intracranial hemorrhage or infarct. Ventricular size is normal. Cerebellum unremarkable. Vascular: No asymmetric hyperdense vasculature at the skull base. Skull: Intact Sinuses/Orbits: Mild mucosal thickening within the left frontal sinus and right sphenoid sinus. Small mucous retention cyst within the visualized left maxillary sinus. No air-fluid levels. Remaining paranasal sinuses are clear. Sinuses are clear. Orbits are unremarkable. Other: Mastoid air cells and middle ear cavities are clear. CT CERVICAL SPINE FINDINGS Alignment: There is moderate motion artifact which slightly limits evaluation of the cervical spine. There is, again noted, reversal of the normal cervical lordosis at C3-C7 with grade 2 anterolisthesis of C3 upon C4 measuring approximately 5 mm, both unchanged. 3 mm anterolisthesis of C4 on C5 also stable. Skull base and vertebrae: The craniocervical alignment is normal. The atlantodental interval is not widened. No definite acute fracture of the cervical spine. Ankylosis of the left C2-3 facet is again noted. Soft tissues and spinal canal: There is  moderate central canal stenosis posterior to C4 secondary to anterolisthesis resulting in mild flattening of the thecal sac. There is mild central canal stenosis at C5-6 and C6-7 secondary to posterior disc osteophyte complex ease resulting in mild flattening of the thecal sac at these levels. No prevertebral soft tissue swelling. No paraspinal fluid collections. No canal hematoma identified. Disc levels: There is intervertebral disc space narrowing at C3-4 with exclusion of the disc material anteriorly. There is intervertebral disc space narrowing and endplate remodeling at X3-G1 in keeping with changes of advanced degenerative disc disease. Review of the axial images demonstrates severe bilateral knee neuroforaminal narrowing at C3-4 secondary to anterolisthesis in combination with advanced bilateral facet arthrosis. Moderate to severe bilateral neuroforaminal narrowing is noted at C5-6 and to a lesser extent, C6-7 secondary to uncovertebral arthrosis. Upper chest: Unremarkable Other: None IMPRESSION: No acute intracranial abnormality.  No calvarial fracture. Mild paranasal sinus disease. No acute fracture or traumatic listhesis of the cervical spine. Stable grade 2 anterolisthesis of C3 upon C4, likely related to underlying facet arthrosis, with resultant moderate central canal stenosis and severe bilateral neuroforaminal narrowing. Stable changes of degenerative disc and degenerative joint disease at C5-C7 resulting in mild central canal stenosis and mild-to-moderate bilateral neuroforaminal narrowing. Electronically Signed   By: Fidela Salisbury MD   On: 11/05/2020 03:08   DG Chest Portable 1 View  Result Date: 11/05/2020 CLINICAL DATA:  Shortness of breath EXAM: PORTABLE CHEST 1 VIEW COMPARISON:  05/03/2019 FINDINGS: Patchy left lower lobe opacity, atelectasis versus pneumonia, with associated small left pleural effusion. No frank interstitial edema.  No pneumothorax. The heart is normal in size.  Thoracic  aortic atherosclerosis. IMPRESSION: Patchy left lower lobe opacity, atelectasis versus pneumonia. Associated small left pleural effusion. Electronically Signed   By: Julian Hy M.D.   On: 11/05/2020 02:07   DG Hip Unilat W or Wo Pelvis 2-3 Views Left  Result Date: 11/05/2020  CLINICAL DATA:  Left hip pain EXAM: DG HIP (WITH OR WITHOUT PELVIS) 2-3V LEFT COMPARISON:  None. FINDINGS: There is severe degenerative arthritis of the left hip with remodeling of the femoral head and acetabulum, near complete loss of the joint space, and subchondral sclerosis and cyst formation. No fracture or dislocation. Limited evaluation of the right hip is unremarkable. Soft tissues are unremarkable. IMPRESSION: Severe left hip degenerative arthritis.  No fracture or dislocation. Electronically Signed   By: Fidela Salisbury MD   On: 11/05/2020 02:49    Procedures Procedures   Medications Ordered in ED Medications  LORazepam (ATIVAN) injection 1 mg (has no administration in time range)  oxyCODONE-acetaminophen (PERCOCET/ROXICET) 5-325 MG per tablet 1 tablet (1 tablet Oral Given 11/05/20 0153)  cefTRIAXone (ROCEPHIN) 1 g in sodium chloride 0.9 % 100 mL IVPB (0 g Intravenous Stopped 11/05/20 0412)  fentaNYL (SUBLIMAZE) injection 75 mcg (75 mcg Intravenous Given 11/05/20 0353)    ED Course  I have reviewed the triage vital signs and the nursing notes.  Pertinent labs & imaging results that were available during my care of the patient were reviewed by me and considered in my medical decision making (see chart for details).    MDM Rules/Calculators/A&P                          75 year old male with a history of atrial fibrillation on Eliquis, chronic diastolic heart failure, diabetes mellitus, DVT, HTN, gout, hyperlipidemia, obesity who presents to the emergency department with a 108-month history of worsening neck pain that radiates down his upper and lower extremities, weakness, paresthesias in the hands and feet.   He has progressed from using a cane to a walker, and despite this has had recurrent falls since symptoms began.  He also adds that he has been having shortness of breath and a productive cough.  No constitutional symptoms.  He is on Eliquis and had a fall 4 days ago where he hit his head, but there was no loss of consciousness.  Vital signs are stable, but oxygen saturation is 93% on room air.  On exam, there are scattered, healing superficial abrasions noted to the head and extremities.  The patient has been discussed and independently evaluated by Dr. Leonette Monarch, attending physician.  Given his presentation, I am concerned for cervical radiculopathy, central cord compression, cervical spondylitic myelopathy.  Also given his shortness of breath and productive cough, will order chest x-ray to ensure he is not having pneumonia or is volume overloaded since he has recently had medication changes per last cardiology note.  He did recently have a fall on thinners and did not have head CT at his last ER visit following the fall.  Will order CT head to ensure he has not had an intracranial hemorrhage or CVA.  Labs and imaging been reviewed and independently interpreted by me.  Chest x-ray with patchy left lower lobe opacity concerning for pneumonia versus atelectasis.  There is also a small left pleural effusion.  This could explain the patient's productive cough and worsening shortness of breath.   He has no leukocytosis or metabolic derangements.  Given concern with laying supine and orthopnea, BNP was obtained and was not elevated.  He was also endorsing some hip pain, and x-ray demonstrated severe left hip degenerative disease, but no acute fracture or dislocation.  Given his recent fall, CT head was obtained and did not show any acute abnormalities or intracranial hemorrhage.  Regarding  his neck pain, CT cervical spine demonstrating grade 2 anterolisthesis of C3 on C4 with resultant moderate central canal  stenosis and severe bilateral neuroforaminal narrowing.  Question central cord compression given his symptoms and CT.  MRI cervical spine has been ordered and is pending.  He will require admission for left lower lobe pneumonia as well as further evaluation of his neck pain given concern for cord compression.  Spoke with Dr. Court Joy, internal medicine residency team, who accepts the patient for admission. The patient appears reasonably stabilized for admission considering the current resources, flow, and capabilities available in the ED at this time, and I doubt any other [facility] requiring further screening and/or treatment in the ED prior to admission.   Final Clinical Impression(s) / ED Diagnoses Final diagnoses:  Pneumonia of left lower lobe due to infectious organism    Rx / DC Orders ED Discharge Orders     None        Tiberius, Loftus, PA-C 11/05/20 0413    Fatima Blank, MD 11/05/20 2104

## 2020-11-05 NOTE — ED Notes (Signed)
Attempted to give report to floor

## 2020-11-08 DIAGNOSIS — Z6830 Body mass index (BMI) 30.0-30.9, adult: Secondary | ICD-10-CM | POA: Diagnosis not present

## 2020-11-08 DIAGNOSIS — M4802 Spinal stenosis, cervical region: Secondary | ICD-10-CM | POA: Diagnosis not present

## 2020-11-13 DIAGNOSIS — M7989 Other specified soft tissue disorders: Secondary | ICD-10-CM | POA: Diagnosis not present

## 2020-11-13 DIAGNOSIS — R0600 Dyspnea, unspecified: Secondary | ICD-10-CM | POA: Diagnosis not present

## 2020-11-13 DIAGNOSIS — Z8679 Personal history of other diseases of the circulatory system: Secondary | ICD-10-CM | POA: Diagnosis not present

## 2020-11-13 DIAGNOSIS — R2243 Localized swelling, mass and lump, lower limb, bilateral: Secondary | ICD-10-CM | POA: Diagnosis not present

## 2020-11-13 DIAGNOSIS — M79605 Pain in left leg: Secondary | ICD-10-CM | POA: Diagnosis not present

## 2020-11-13 DIAGNOSIS — R0602 Shortness of breath: Secondary | ICD-10-CM | POA: Diagnosis not present

## 2020-11-13 DIAGNOSIS — Z7901 Long term (current) use of anticoagulants: Secondary | ICD-10-CM | POA: Diagnosis not present

## 2020-11-13 DIAGNOSIS — I4891 Unspecified atrial fibrillation: Secondary | ICD-10-CM | POA: Diagnosis not present

## 2020-11-13 DIAGNOSIS — M79604 Pain in right leg: Secondary | ICD-10-CM | POA: Diagnosis not present

## 2020-11-13 DIAGNOSIS — R609 Edema, unspecified: Secondary | ICD-10-CM | POA: Diagnosis not present

## 2020-11-13 DIAGNOSIS — R600 Localized edema: Secondary | ICD-10-CM | POA: Diagnosis not present

## 2020-11-13 DIAGNOSIS — J9811 Atelectasis: Secondary | ICD-10-CM | POA: Diagnosis not present

## 2020-11-13 DIAGNOSIS — Z79899 Other long term (current) drug therapy: Secondary | ICD-10-CM | POA: Diagnosis not present

## 2020-11-14 DIAGNOSIS — G992 Myelopathy in diseases classified elsewhere: Secondary | ICD-10-CM | POA: Diagnosis not present

## 2020-11-14 DIAGNOSIS — M4802 Spinal stenosis, cervical region: Secondary | ICD-10-CM | POA: Diagnosis not present

## 2020-11-16 ENCOUNTER — Telehealth: Payer: Self-pay | Admitting: *Deleted

## 2020-11-16 NOTE — Telephone Encounter (Signed)
Patient with diagnosis of atrial fibrillation on Eliquis for anticoagulation.    Procedure: cervical fusion Date of procedure: TBD   CHA2DS2-VASc Score = 5  This indicates a 7.2% annual risk of stroke. The patient's score is based upon: CHF History: Yes HTN History: Yes Diabetes History: Yes Stroke History: No Vascular Disease History: Yes Age Score: 1 Gender Score: 0  CrCl 59 (with adjusted body weight) Platelet count 251  Per office protocol, patient will need to hold Elqius for 3 days prior to procedure.    There is no information in chart about prior DVT.  01/2014 note suggests no history, but it is there at next Epic note in 2021.  Unknown if provoked or not. If unprovoked, would recommend lovenox bridge, but will defer to primary cardiologist  Patient should restart Eliquis on the evening of procedure or day after, at discretion of procedure MD

## 2020-11-16 NOTE — Telephone Encounter (Signed)
   [facility] Pre-operative Risk Assessment    Patient Name: Logan [name]  DOB: 11/29/1945 MRN: [id number]  HEARTCARE STAFF:  - IMPORTANT!!!!!! Under Visit Info/Reason for Call, type in Other and utilize the format Clearance MM/DD/YY or Clearance TBD. Do not use dashes or single digits. - Please review there is not already an duplicate clearance open for this procedure. - If request is for dental extraction, please clarify the # of teeth to be extracted. - If the patient is currently at the dentist's office, call Pre-Op Callback Staff (MA/nurse) to input urgent request.  - If the patient is not currently in the dentist office, please route to the Pre-Op pool.  Request for surgical clearance:  What type of surgery is being performed? CERVICAL FUSION  When is this surgery scheduled? TBD  What type of clearance is required (medical clearance vs. Pharmacy clearance to hold med vs. Both)? BOTH  Are there any medications that need to be held prior to surgery and how long?  [place] name and name of physician performing surgery? [facility]; DR. Pieter Partridge DAWLEY  What is the office phone number? [phone]   7.   What is the office fax number? [place]: [email or web address]  8.   Anesthesia type (None, local, MAC, general) ? GENERAL   Julaine Hua 11/16/2020, 11:25 AM  _________________________________________________________________   (provider comments below)

## 2020-11-18 DIAGNOSIS — E785 Hyperlipidemia, unspecified: Secondary | ICD-10-CM | POA: Diagnosis not present

## 2020-11-18 DIAGNOSIS — I10 Essential (primary) hypertension: Secondary | ICD-10-CM | POA: Diagnosis not present

## 2020-11-18 DIAGNOSIS — E1169 Type 2 diabetes mellitus with other specified complication: Secondary | ICD-10-CM | POA: Diagnosis not present

## 2020-11-21 NOTE — Telephone Encounter (Signed)
Patient is scheduled to see Dr. Bettina Gavia in the near future.  During the last office visit, his blood pressure was low and he complained of profound weakness.  Unfortunately patient was subsequently admitted to the hospital with pneumonia.  He will need to be evaluated prior to final clearance.  Will remove from preop pool and defer final clearance to MD after the next visit.

## 2020-11-21 NOTE — Telephone Encounter (Signed)
Appt with Dr. Bettina Gavia 12/10/20. Will forward notes to MD for upcoming appt. Will send FYI to surgeon's office pt has appt 12/10/20.

## 2020-12-09 NOTE — Progress Notes (Signed)
Cardiology Office Note:    Date:  12/10/2020   ID:  Logan [name], DOB 29-Jul-1945, MRN [id number]  PCP:  [name], MD  Cardiologist:  Shirlee More, MD    Referring MD: [name], MD    ASSESSMENT:    1. Preoperative cardiovascular examination   2. Paroxysmal atrial fibrillation (HCC)   3. On amiodarone therapy   4. Chronic anticoagulation   5. Hypertensive heart disease, unspecified whether heart failure present   6. Hypotension due to drugs    PLAN:    In order of problems listed above:  With cardiology perspective I think he is optimized for his planned surgical procedure.  He is maintaining sinus rhythm on low-dose amiodarone as no evidence of heart failure has a normal ejection fraction on echocardiogram 1 year ago and I will think he requires any further preoperative testing.  He will need to withdraw his anticoagulant for 6 full doses prior to surgery and resume when safe from a bleeding perspective.  With his episodes of atrial fibrillation amiodarone ultimately monitored bed check EKG postoperative day 1 any problems contact [facility] to participate in his care. Stable continue amiodarone current anticoagulant no need to miss 6 full doses 3 days prior to neurosurgery Continue amiodarone through the perioperative course Improved his previous hypotension is resolved particularly discontinue his alpha-blocker. Last visit also stopped his statin with muscle weakness at this time I would not resume   Next appointment: As needed after surgery   Medication Adjustments/Labs and Tests Ordered: Current medicines are reviewed at length with the patient today.  Concerns regarding medicines are outlined above.  No orders of the defined types were placed in this encounter.  No orders of the defined types were placed in this encounter.   Chief Complaint  Patient presents with   Atrial Fibrillation    On amiodarone   Anticoagulation   Hypertension    Pre-op Exam    History of Present Illness:    Logan [name] is a 75 y.o. male with a hx of atrial fibrillation failing to maintain sinus rhythm with cardioversion and on amiodarone and exhibiting toxicity with gait dysfunction and muscle weakness leading to discontinuation, chronic anticoagulation and hypertensive heart disease last seen 10/26/2020 with hypotension blood pressure of 90/50 with discontinuation of Cardura.  His statin was also discontinued with muscle weakness.  Compliance with diet, lifestyle and medications: Yes  He recently admitted to [facility] with left lower lobe pneumonia however prolactin level was normal presumed viral and antibiotics discontinued after few days.  He is found to have cervical radiculopathy with severe spinal stenosis at C3-C4 and moderate stenosis C5-C6 and C6-C7.  We received correspondence regarding preoperative evaluation regarding anticoagulation and pending spine surgery. Request for surgical clearance:   What type of surgery is being performed? CERVICAL FUSION   When is this surgery scheduled? TBD   What type of clearance is required (medical clearance vs. Pharmacy clearance to hold med vs. Both)? BOTH   Are there any medications that need to be held prior to surgery and how long?  [place] name and name of physician performing surgery? [facility]; DR. Pieter Partridge DAWLEY   What is the office phone number? [phone]   7.   What is the office fax number? [place]: [email or web address]   8.   Anesthesia type (None, local, MAC, general) ? GENERAL  His echocardiogram May 2021 showed preserved left and right ventricular function Request for surgical  clearance:   What type of surgery is being performed? CERVICAL FUSION   When is this surgery scheduled? TBD   What type of clearance is required (medical clearance vs. Pharmacy clearance to hold med vs. Both)? BOTH   Are there any medications that need to be held  prior to surgery and how long?  [place] name and name of physician performing surgery? [facility]; DR. Pieter Partridge DAWLEY   What is the office phone number? [phone]   7.   What is the office fax number? [place]: [email or web address]   8.   Anesthesia type (None, local, MAC, general) ? GENERAL     His echocardiogram May 2021 showed preserved left and right ventricular function  1. Left ventricular ejection fraction, by estimation, is 55 to 60%. The  left ventricle has normal function. The left ventricle has no regional  wall motion abnormalities. There is mild concentric left ventricular  hypertrophy. Left ventricular diastolic  parameters are indeterminate.   2. Right ventricular systolic function is mildly reduced. The right  ventricular size is mildly enlarged. Mildly increased right ventricular  wall thickness. There is normal pulmonary artery systolic pressure.   3. Right atrial size was severely dilated.   4. The mitral valve is normal in structure. No evidence of mitral valve  regurgitation. No evidence of mitral stenosis.   5. The aortic valve is tricuspid. Aortic valve regurgitation is mild. No  aortic stenosis is present.   6. The inferior vena cava is dilated in size with >50% respiratory  variability, suggesting right atrial pressure of 8 mmHg.   In hospital EKG 11/05/2020 showed atrial fibrillation controlled rate poor R wave progression Baseline artifact low voltage  His son is present participates in evaluation decision making Logan [name] struggles day-to-day to try to maintain independence and has such profound weakness and pain he has difficulty ambulating. He is committed to having his neurosurgery performed. He is having no chest pain edema shortness of breath palpitation or syncope. His hypotension from the last episode has resolved. His EKG shows and maintaining sinus rhythm. Past Medical History:  Diagnosis Date   Anxiety     Atherosclerotic renal artery stenosis, unilateral (HCC) 02/28/2014   Atrial fibrillation (HCC)    Bilateral lower extremity edema    Chronic diastolic heart failure ([place]) 10/21/2019   Diabetes mellitus without complication (HCC)    DVT (deep venous thrombosis) (HCC)    Esophageal reflux    Essential hypertension 10/21/2019   Gout    Hyperlipidemia    Hypertension    Mixed hyperlipidemia 10/21/2019   Morbid obesity ([place])    Obesity (BMI 30-39.9) 08/24/2019   Orthopnea    Osteoarthritis    Persistent atrial fibrillation ([place]) 10/21/2019   Seasonal allergies    Sleep apnea     Past Surgical History:  Procedure Laterality Date   ABDOMINAL AORTAGRAM N/A 02/28/2014   Procedure: ABDOMINAL Maxcine Ham;  Surgeon: Serafina Mitchell, MD;  Location: [facility] C[facility] LAB;  Service: Cardiovascular;  Laterality: N/A;   APPENDECTOMY     ATRIAL FIBRILLATION ABLATION N/A 02/29/2020   Procedure: ATRIAL FIBRILLATION ABLATION;  Surgeon: Constance Haw, MD;  Location: [facility] CV LAB;  Service: Cardiovascular;  Laterality: N/A;   CARDIOVERSION N/A 10/18/2020   Procedure: CARDIOVERSION (CATH LAB);  Surgeon: Constance Haw, MD;  Location: [facility] CV LAB;  Service: Cardiovascular;  Laterality: N/A;   CARPAL TUNNEL RELEASE Left     Current Medications: Current Meds  Medication  Sig   allopurinol (ZYLOPRIM) 300 MG tablet Take 300 mg by mouth daily.   apixaban (ELIQUIS) 5 MG TABS tablet Take 1 tablet (5 mg total) by mouth 2 (two) times daily.   carvedilol (COREG) 12.5 MG tablet Take 12.5 mg by mouth 2 (two) times daily with a meal.   DHEA 25 MG CAPS Take 25 mg by mouth daily.   fluticasone (FLONASE) 50 MCG/ACT nasal spray Place 1 spray into both nostrils daily as needed for rhinitis.    furosemide (LASIX) 40 MG tablet Take 1 tablet (40 mg total) by mouth daily.   ketoconazole (NIZORAL) 2 % cream Apply 1 application topically daily as needed for irritation.    loratadine (CLARITIN) 10 MG tablet Take 10 mg by  mouth daily as needed for allergies.   losartan (COZAAR) 100 MG tablet Take 100 mg by mouth daily.   Multiple Vitamin (MULTIVITAMIN) tablet Take 1 tablet by mouth daily.   omeprazole (PRILOSEC) 20 MG capsule Take 20 mg by mouth daily as needed (for heartburn).    Potassium Chloride ER 20 MEQ TBCR Take 20 mEq by mouth 2 (two) times daily.     Allergies:   Patient has no known allergies.   Social History   Socioeconomic History   Marital status: Widowed    Spouse name: Not on file   Number of children: Not on file   Years of education: Not on file   Highest education level: Not on file  Occupational History   Not on file  Tobacco Use   Smoking status: Former   Smokeless tobacco: Former    Types: Chew  Substance and Sexual Activity   Alcohol use: Never   Drug use: Never   Sexual activity: Not on file  Other Topics Concern   Not on file  Social History Narrative   Not on file   Social Determinants of Health   Financial Resource Strain: Not on file  Food Insecurity: Not on file  Transportation Needs: Not on file  Physical Activity: Not on file  Stress: Not on file  Social Connections: Not on file     Family History: The patient's family history includes Atrial fibrillation in his brother; Cancer in his father; Heart disease in his brother and mother. ROS:   Please see the history of present illness.    All other systems reviewed and are negative.  EKGs/Labs/Other Studies Reviewed:    The following studies were reviewed today:  EKG:  EKG ordered today and personally reviewed.  The ekg ordered today demonstrates baseline artifact sinus rhythm controlled ventricular rate  Recent Labs: 10/26/2020: TSH 3.240 11/05/2020: ALT 19; B Natriuretic Peptide 111.3; BUN 17; Creatinine, Ser 1.19; Hemoglobin 13.8; Magnesium 1.9; Platelets 251; Potassium 3.5; Sodium 141  Recent Lipid Panel No results found for: CHOL, TRIG, HDL, CHOLHDL, VLDL, LDLCALC, LDLDIRECT  Physical Exam:     VS:  BP 111/64 (BP Location: Left Arm, Patient Position: Sitting, Cuff Size: Normal)   Pulse 81   Ht _0  (1.702 m)   Wt 182 lb 3.2 oz (82.6 kg)   SpO2 97%   BMI 28.54 kg/m     Wt Readings from Last 3 Encounters:  12/10/20 182 lb 3.2 oz (82.6 kg)  10/26/20 195 lb (88.5 kg)  10/18/20 200 lb (90.7 kg)     GEN: He looks stronger than his last visit well nourished, well developed in no acute distress HEENT: Normal NECK: No JVD; No carotid bruits LYMPHATICS: No lymphadenopathy CARDIAC: RRR,  no murmurs, rubs, gallops RESPIRATORY:  Clear to auscultation without rales, wheezing or rhonchi  ABDOMEN: Soft, non-tender, non-distended MUSCULOSKELETAL:  No edema; No deformity  SKIN: Warm and dry NEUROLOGIC:  Alert and oriented x 3 PSYCHIATRIC:  Normal affect    Signed, Shirlee More, MD  12/10/2020 3:40 PM    [facility]

## 2020-12-10 ENCOUNTER — Encounter: Payer: Self-pay | Admitting: Cardiology

## 2020-12-10 ENCOUNTER — Ambulatory Visit (INDEPENDENT_AMBULATORY_CARE_PROVIDER_SITE_OTHER): Payer: Medicare HMO | Admitting: Cardiology

## 2020-12-10 ENCOUNTER — Other Ambulatory Visit: Payer: Self-pay

## 2020-12-10 VITALS — BP 111/64 | HR 81 | Ht 67.0 in | Wt 182.2 lb

## 2020-12-10 DIAGNOSIS — Z79899 Other long term (current) drug therapy: Secondary | ICD-10-CM

## 2020-12-10 DIAGNOSIS — I952 Hypotension due to drugs: Secondary | ICD-10-CM | POA: Diagnosis not present

## 2020-12-10 DIAGNOSIS — Z01810 Encounter for preprocedural cardiovascular examination: Secondary | ICD-10-CM

## 2020-12-10 DIAGNOSIS — Z7901 Long term (current) use of anticoagulants: Secondary | ICD-10-CM | POA: Diagnosis not present

## 2020-12-10 DIAGNOSIS — I480 Paroxysmal atrial fibrillation: Secondary | ICD-10-CM | POA: Diagnosis not present

## 2020-12-10 DIAGNOSIS — I119 Hypertensive heart disease without heart failure: Secondary | ICD-10-CM

## 2020-12-10 NOTE — Patient Instructions (Signed)
Medication Instructions:  .isntcu  *If you need a refill on your cardiac medications before your next appointment, please call your pharmacy*   Lab Work: None If you have labs (blood work) drawn today and your tests are completely normal, you will receive your results only by: [place] (if you have MyChart) OR A paper copy in the mail If you have any lab test that is abnormal or we need to change your treatment, we will call you to review the results.   Testing/Procedures: None   Follow-Up: At [facility], you and your health needs are our priority.  As part of our continuing mission to provide you with exceptional heart care, we have created designated Provider Care Teams.  These Care Teams include your primary Cardiologist (physician) and Advanced Practice Providers (APPs -  Physician Assistants and Nurse Practitioners) who all work together to provide you with the care you need, when you need it.  We recommend signing up for the patient portal called "MyChart".  Sign up information is provided on this After Visit Summary.  MyChart is used to connect with patients for Virtual Visits (Telemedicine).  Patients are able to view lab/test results, encounter notes, upcoming appointments, etc.  Non-urgent messages can be sent to your provider as well.   To learn more about what you can do with MyChart, go to [email or web address].    Your next appointment:   3 month(s)  The format for your next appointment:   In Person  Provider:   Shirlee More, MD   Other Instructions

## 2020-12-11 ENCOUNTER — Other Ambulatory Visit: Payer: Self-pay | Admitting: Neurological Surgery

## 2020-12-17 ENCOUNTER — Other Ambulatory Visit: Payer: Self-pay | Admitting: Neurological Surgery

## 2020-12-21 ENCOUNTER — Ambulatory Visit: Payer: Medicare HMO | Admitting: Cardiology

## 2020-12-25 NOTE — Progress Notes (Signed)
Surgical Instructions    Your procedure is scheduled on 01/01/21.  Report to [facility] Main Entrance "A" at 12:00 P.M., then check in with the Admitting office.  Call this number if you have problems the morning of surgery:  [phone]   If you have any questions prior to your surgery date call [phone]: Open Monday-Friday 8am-4pm    Remember:  Do not eat after midnight the night before your surgery  You may drink clear liquids until 11:00 am the morning of your surgery.   Clear liquids allowed are: Water, Non-Citrus Juices (without pulp), Carbonated Beverages, Clear Tea, Black Coffee ONLY (NO MILK, CREAM OR POWDERED CREAMER of any kind), and Gatorade    Take these medicines the morning of surgery with A SIP OF WATER: allopurinol (ZYLOPRIM)  carvedilol (COREG)   As Needed: fluticasone (FLONASE)  loratadine (CLARITIN)  omeprazole (PRILOSEC)   Follow your surgeon's instructions on when to stop Eliquis.  If no instructions were given by your surgeon then you will need to call the office to get those instructions.      As of today, STOP taking any Aspirin (unless otherwise instructed by your surgeon) Aleve, Naproxen, Ibuprofen, Motrin, Advil, Goody's, BC's, all herbal medications, fish oil, and all vitamins.   HOW TO MANAGE YOUR DIABETES BEFORE AND AFTER SURGERY  Why is it important to control my blood sugar before and after surgery? Improving blood sugar levels before and after surgery helps healing and can limit problems. A way of improving blood sugar control is eating a healthy diet by:  Eating less sugar and carbohydrates  Increasing activity/exercise  Talking with your doctor about reaching your blood sugar goals High blood sugars (greater than 180 mg/dL) can raise your risk of infections and slow your recovery, so you will need to focus on controlling your diabetes during the weeks before surgery. Make sure that the doctor who takes care of your diabetes knows about  your planned surgery including the date and location.  How do I manage my blood sugar before surgery? Check your blood sugar at least 4 times a day, starting 2 days before surgery, to make sure that the level is not too high or low.  Check your blood sugar the morning of your surgery when you wake up and every 2 hours until you get to the Short Stay unit.  If your blood sugar is less than 70 mg/dL, you will need to treat for low blood sugar: Do not take insulin. Treat a low blood sugar (less than 70 mg/dL) with  cup of clear juice (cranberry or apple), 4 glucose tablets, OR glucose gel. Recheck blood sugar in 15 minutes after treatment (to make sure it is greater than 70 mg/dL). If your blood sugar is not greater than 70 mg/dL on recheck, call [phone] for further instructions. Report your blood sugar to the short stay nurse when you get to Short Stay.  If you are admitted to the hospital after surgery: Your blood sugar will be checked by the staff and you will probably be given insulin after surgery (instead of oral diabetes medicines) to make sure you have good blood sugar levels. The goal for blood sugar control after surgery is 80-180 mg/dL.           Do not wear jewelry or makeup Do not wear lotions, powders, perfumes/colognes, or deodorant. Do not shave 48 hours prior to surgery.  Men may shave face and neck. Do not bring valuables to the hospital. DO  Not wear nail polish, gel polish, artificial nails, or any other type of covering on  natural nails including finger and toenails. If patients have artificial nails, gel coating, etc. that need to be removed by a nail salon please have this removed prior to surgery or surgery may need to be canceled/delayed if the surgeon/ anesthesia feels like the patient is unable to be adequately monitored.             [facility] is not responsible for any belongings or valuables.  Do NOT Smoke (Tobacco/Vaping)  24 hours prior to your  procedure If you use a CPAP at night, you may bring all equipment for your overnight stay.   Contacts, glasses, dentures or bridgework may not be worn into surgery, please bring cases for these belongings   For patients admitted to the hospital, discharge time will be determined by your treatment team.   Patients discharged the day of surgery will not be allowed to drive home, and someone needs to stay with them for 24 hours.  ONLY 1 SUPPORT PERSON MAY BE PRESENT WHILE YOU ARE IN SURGERY. IF YOU ARE TO BE ADMITTED ONCE YOU ARE IN YOUR ROOM YOU WILL BE ALLOWED TWO (2) VISITORS.  Minor children may have two parents present. Special consideration for safety and communication needs will be reviewed on a case by case basis.  Special instructions:    Oral Hygiene is also important to reduce your risk of infection.  Remember - BRUSH YOUR TEETH THE MORNING OF SURGERY WITH YOUR REGULAR TOOTHPASTE   [facility]- Preparing For Surgery  Before surgery, you can play an important role. Because skin is not sterile, your skin needs to be as free of germs as possible. You can reduce the number of germs on your skin by washing with CHG (chlorahexidine gluconate) Soap before surgery.  CHG is an antiseptic cleaner which kills germs and bonds with the skin to continue killing germs even after washing.     Please do not use if you have an allergy to CHG or antibacterial soaps. If your skin becomes reddened/irritated stop using the CHG.  Do not shave (including legs and underarms) for at least 48 hours prior to first CHG shower. It is OK to shave your face.  Please follow these instructions carefully.     Shower the NIGHT BEFORE SURGERY and the MORNING OF SURGERY with CHG Soap.   If you chose to wash your hair, wash your hair first as usual with your normal shampoo. After you shampoo, rinse your hair and body thoroughly to remove the shampoo.  Then [employer] and genitals (private parts) with your normal soap  and rinse thoroughly to remove soap.  After that Use CHG Soap as you would any other liquid soap. You can apply CHG directly to the skin and wash gently with a scrungie or a clean washcloth.   Apply the CHG Soap to your body ONLY FROM THE NECK DOWN.  Do not use on open wounds or open sores. Avoid contact with your eyes, ears, mouth and genitals (private parts). Wash Face and genitals (private parts)  with your normal soap.   Wash thoroughly, paying special attention to the area where your surgery will be performed.  Thoroughly rinse your body with warm water from the neck down.  DO NOT shower/wash with your normal soap after using and rinsing off the CHG Soap.  Pat yourself dry with a CLEAN TOWEL.  Wear CLEAN PAJAMAS to bed the  night before surgery  Place CLEAN SHEETS on your bed the night before your surgery  DO NOT SLEEP WITH PETS.   Day of Surgery:  Take a shower with CHG soap. Wear Clean/Comfortable clothing the morning of surgery Do not apply any deodorants/lotions.   Remember to brush your teeth WITH YOUR REGULAR TOOTHPASTE.   Please read over the following fact sheets that you were given.

## 2020-12-26 ENCOUNTER — Encounter (HOSPITAL_COMMUNITY): Payer: Self-pay

## 2020-12-26 ENCOUNTER — Other Ambulatory Visit: Payer: Self-pay

## 2020-12-26 ENCOUNTER — Encounter (HOSPITAL_COMMUNITY)
Admission: RE | Admit: 2020-12-26 | Discharge: 2020-12-26 | Disposition: A | Discharge status: Home / Self Care | Payer: Medicare HMO | Source: Ambulatory Visit | Attending: Neurological Surgery | Admitting: Neurological Surgery

## 2020-12-26 DIAGNOSIS — Z01812 Encounter for preprocedural laboratory examination: Secondary | ICD-10-CM | POA: Diagnosis not present

## 2020-12-26 LAB — TYPE AND SCREEN
ABO/RH(D): O POS
Antibody Screen: NEGATIVE

## 2020-12-26 LAB — CBC
HCT: 33.9 % — ABNORMAL LOW (ref 39.0–52.0)
Hemoglobin: 11.2 g/dL — ABNORMAL LOW (ref 13.0–17.0)
MCH: 34.5 pg — ABNORMAL HIGH (ref 26.0–34.0)
MCHC: 33 g/dL (ref 30.0–36.0)
MCV: 104.3 fL — ABNORMAL HIGH (ref 80.0–100.0)
Platelets: 322 10*3/uL (ref 150–400)
RBC: 3.25 MIL/uL — ABNORMAL LOW (ref 4.22–5.81)
RDW: 14.5 % (ref 11.5–15.5)
WBC: 8.5 10*3/uL (ref 4.0–10.5)
nRBC: 0 % (ref 0.0–0.2)

## 2020-12-26 LAB — BASIC METABOLIC PANEL
Anion gap: 10 (ref 5–15)
BUN: 25 mg/dL — ABNORMAL HIGH (ref 8–23)
CO2: 27 mmol/L (ref 22–32)
Calcium: 9.5 mg/dL (ref 8.9–10.3)
Chloride: 100 mmol/L (ref 98–111)
Creatinine, Ser: 0.95 mg/dL (ref 0.61–1.24)
GFR, Estimated: >60 mL/min (ref >60)
Glucose, Bld: 88 mg/dL (ref 70–99)
Potassium: 3.8 mmol/L (ref 3.5–5.1)
Sodium: 137 mmol/L (ref 135–145)

## 2020-12-26 LAB — SURGICAL PCR SCREEN
MRSA, PCR: NEGATIVE
Staphylococcus aureus: NEGATIVE

## 2020-12-26 NOTE — Progress Notes (Addendum)
PCP: [name], MD Cardiologist: Shirlee More, MD  EKG: 12/10/20 CXR: 10/26/20 ECHO: 09/13/19 Stress Test: >10 years Cardiac Cath: denies  Fasting Blood Sugar- does not check glucose.  Taken off diabetes meds 5 years ago per patient Checks Blood Sugar__0_ times a day  OSA: Yes CPAP: No  ASA: No Eliquis: Last dose 12/28/20  Covid test 12/28/20 at 11:00  Anesthesia Review: Yes, cardiac history.   Patient denies shortness of breath, fever, cough, and chest pain at PAT appointment.  Patient verbalized understanding of instructions provided today at the PAT appointment.  Patient asked to review instructions at home and day of surgery.

## 2020-12-27 NOTE — Anesthesia Preprocedure Evaluation (Addendum)
Anesthesia Evaluation  Patient identified by MRN, date of birth, ID band Patient awake    Reviewed: Allergy & Precautions, NPO status , Patient's Chart, lab work & pertinent test results  Airway Mallampati: II  TM Distance: >3 FB Neck ROM: Full    Dental  (+) Dental Advisory Given   Pulmonary sleep apnea , former smoker,    breath sounds clear to auscultation       Cardiovascular hypertension, Pt. on medications and Pt. on home beta blockers + Peripheral Vascular Disease   Rhythm:Regular Rate:Normal     Neuro/Psych  Neuromuscular disease    GI/Hepatic Neg liver ROS, GERD  ,  Endo/Other  diabetes, Type 2  Renal/GU negative Renal ROS     Musculoskeletal  (+) Arthritis ,   Abdominal   Peds  Hematology negative hematology ROS (+)   Anesthesia Other Findings   Reproductive/Obstetrics                            Lab Results  Component Value Date   WBC 8.5 12/26/2020   HGB 11.2 (L) 12/26/2020   HCT 33.9 (L) 12/26/2020   MCV 104.3 (H) 12/26/2020   PLT 322 12/26/2020   Lab Results  Component Value Date   CREATININE 0.95 12/26/2020   BUN 25 (H) 12/26/2020   NA 137 12/26/2020   K 3.8 12/26/2020   CL 100 12/26/2020   CO2 27 12/26/2020    Anesthesia Physical Anesthesia Plan  ASA: 3  Anesthesia Plan: General   Post-op Pain Management:    Induction: Intravenous  PONV Risk Score and Plan: 2 and Dexamethasone, Ondansetron and Treatment may vary due to age or medical condition  Airway Management Planned: Oral ETT and Video Laryngoscope Planned  Additional Equipment: None  Intra-op Plan:   Post-operative Plan: Extubation in OR  Informed Consent: I have reviewed the patients History and Physical, chart, labs and discussed the procedure including the risks, benefits and alternatives for the proposed anesthesia with the patient or authorized representative who has indicated his/her  understanding and acceptance.     Dental advisory given  Plan Discussed with: CRNA  Anesthesia Plan Comments:        Anesthesia Quick Evaluation

## 2020-12-27 NOTE — Progress Notes (Signed)
Anesthesia Chart Review:   Case: [id number] Date/Time: 01/01/21 1345   Procedure: ACDF C3-4 - 3C   Anesthesia type: General   Pre-op diagnosis: STENOSIS OF CERVICAL SPINE WITH MYELOPATHY   Location: [facility] OR ROOM 18 / [facility] OR   Surgeons: Karsten Ro, DO       DISCUSSION: Pt is 75 years old with hx of chronic diastolic heart failure, PAF (s/p cardioversion 10/18/20, s/p ablation 02/29/20), HTN, DM, DVT, OSA  Last dose Eliquis 12/28/2020   VS: BP (!) 103/56   Pulse 60   Temp 36.9 C (Oral)   Resp 18   Ht '5\' 7"'$  (1.702 m)   SpO2 93%   BMI 28.54 kg/m   PROVIDERS: - PCP is [name], MD - Cardiologist is Shirlee More, MD. Cleared for surgery at last office visit 12/08/2020. "check EKG postoperative day 1 any problems contact [facility] to participate in his care." - EP cardiologist is Allegra Lai, MD. Last office visit 10/08/20   LABS: Labs reviewed: Acceptable for surgery. (all labs ordered are listed, but only abnormal results are displayed)  Labs Reviewed  CBC - Abnormal; Notable for the following components:      Result Value   RBC 3.25 (*)    Hemoglobin 11.2 (*)    HCT 33.9 (*)    MCV 104.3 (*)    MCH 34.5 (*)    All other components within normal limits  BASIC METABOLIC PANEL - Abnormal; Notable for the following components:   BUN 25 (*)    All other components within normal limits  SURGICAL PCR SCREEN  TYPE AND SCREEN     IMAGES: 1 view CXR 11/05/20:  - Patchy left lower lobe opacity, atelectasis versus pneumonia. - Associated small left pleural effusion  EKG 12/10/20: Sinus rhythm   CV: CT cardiac morphology 02/23/20:  1. There is normal pulmonary vein drainage into the left atrium. 2. The left atrial appendage is large windsock type with two lobes and ostial size 36 x 25 mm and length 37 mm. There is no thrombus in the left atrial appendage. 3. The esophagus runs in the left atrial midline and is not in the proximity to any of the pulmonary  veins. 4. Coronary calcification noted in all three coronary distributions. Coronary calcium score 17.2. This is 19th percentile in age/sex based comparison.  Echo 09/13/19:  1. Left ventricular ejection fraction, by estimation, is 55 to 60%. The left ventricle has normal function. The left ventricle has no regional wall motion abnormalities. There is mild concentric left ventricular hypertrophy. Left ventricular diastolic parameters are indeterminate.   2. Right ventricular systolic function is mildly reduced. The right ventricular size is mildly enlarged. Mildly increased right ventricular wall thickness. There is normal pulmonary artery systolic pressure.   3. Right atrial size was severely dilated.   4. The mitral valve is normal in structure. No evidence of mitral valve regurgitation. No evidence of mitral stenosis.   5. The aortic valve is tricuspid. Aortic valve regurgitation is mild. No aortic stenosis is present.   6. The inferior vena cava is dilated in size with >50% respiratory variability, suggesting right atrial pressure of 8 mmHg.    Past Medical History:  Diagnosis Date   Anxiety    Atherosclerotic renal artery stenosis, unilateral ([place]) 02/28/2014   Atrial fibrillation (HCC)    Bilateral lower extremity edema    Chronic diastolic heart failure ([place]) 10/21/2019   Diabetes mellitus without complication (HCC)    DVT (  deep venous thrombosis) (HCC)    Esophageal reflux    Essential hypertension 10/21/2019   Gout    Hyperlipidemia    Hypertension    Mixed hyperlipidemia 10/21/2019   Morbid obesity ([place])    Obesity (BMI 30-39.9) 08/24/2019   Orthopnea    Osteoarthritis    Persistent atrial fibrillation ([place]) 10/21/2019   Seasonal allergies    Sleep apnea     Past Surgical History:  Procedure Laterality Date   ABDOMINAL AORTAGRAM N/A 02/28/2014   Procedure: ABDOMINAL Maxcine Ham;  Surgeon: Serafina Mitchell, MD;  Location: [facility] CATH LAB;  Service: Cardiovascular;  Laterality: N/A;    APPENDECTOMY     ATRIAL FIBRILLATION ABLATION N/A 02/29/2020   Procedure: ATRIAL FIBRILLATION ABLATION;  Surgeon: Constance Haw, MD;  Location: [facility] CV LAB;  Service: Cardiovascular;  Laterality: N/A;   CARDIOVERSION N/A 10/18/2020   Procedure: CARDIOVERSION (CATH LAB);  Surgeon: Constance Haw, MD;  Location: [facility] CV LAB;  Service: Cardiovascular;  Laterality: N/A;   CARPAL TUNNEL RELEASE Left     MEDICATIONS:  acetaminophen (TYLENOL) 500 MG tablet   allopurinol (ZYLOPRIM) 300 MG tablet   apixaban (ELIQUIS) 5 MG TABS tablet   carvedilol (COREG) 12.5 MG tablet   DHEA 25 MG CAPS   fluticasone (FLONASE) 50 MCG/ACT nasal spray   furosemide (LASIX) 40 MG tablet   ketoconazole (NIZORAL) 2 % cream   loratadine (CLARITIN) 10 MG tablet   losartan (COZAAR) 100 MG tablet   Multiple Vitamin (MULTIVITAMIN) tablet   omeprazole (PRILOSEC) 20 MG capsule   potassium chloride (MICRO-K) 10 MEQ CR capsule   traMADol (ULTRAM) 50 MG tablet   No current facility-administered medications for this encounter.   Last dose Eliquis 12/28/2020   If no changes, I anticipate pt can proceed with surgery as scheduled.   Willeen Cass, PhD, FNP-BC [facility] Stay [facility]/Anesthesiology Phone: [phone] 12/27/2020 12:51 PM

## 2020-12-28 ENCOUNTER — Other Ambulatory Visit (HOSPITAL_COMMUNITY)
Admission: RE | Admit: 2020-12-28 | Discharge: 2020-12-28 | Disposition: A | Discharge status: Home / Self Care | Payer: Medicare HMO | Source: Ambulatory Visit | Attending: Neurological Surgery | Admitting: Neurological Surgery

## 2020-12-28 DIAGNOSIS — Z01812 Encounter for preprocedural laboratory examination: Secondary | ICD-10-CM | POA: Insufficient documentation

## 2020-12-28 DIAGNOSIS — Z20822 Contact with and (suspected) exposure to covid-19: Secondary | ICD-10-CM | POA: Diagnosis not present

## 2020-12-28 LAB — SARS CORONAVIRUS 2 (TAT 6-24 HRS): SARS Coronavirus 2: NEGATIVE

## 2020-12-31 NOTE — Progress Notes (Signed)
patient voiced understanding of new arrival time of 0830

## 2021-01-01 ENCOUNTER — Other Ambulatory Visit: Payer: Self-pay

## 2021-01-01 ENCOUNTER — Inpatient Hospital Stay (HOSPITAL_COMMUNITY)
Admission: RE | Admit: 2021-01-01 | Discharge: 2021-01-03 | DRG: 472 | Disposition: A | Discharge status: Rehabilitation Facility | Payer: Medicare HMO | Attending: Neurological Surgery | Admitting: Neurological Surgery

## 2021-01-01 ENCOUNTER — Encounter (HOSPITAL_COMMUNITY)
Admission: RE | Disposition: A | Discharge status: Rehabilitation Facility | Payer: Self-pay | Source: Home / Self Care | Attending: Neurological Surgery

## 2021-01-01 ENCOUNTER — Ambulatory Visit (HOSPITAL_COMMUNITY): Payer: Medicare HMO | Admitting: Certified Registered Nurse Anesthetist

## 2021-01-01 ENCOUNTER — Ambulatory Visit (HOSPITAL_COMMUNITY): Payer: Medicare HMO

## 2021-01-01 ENCOUNTER — Ambulatory Visit (HOSPITAL_COMMUNITY): Payer: Medicare HMO | Admitting: Emergency Medicine

## 2021-01-01 ENCOUNTER — Encounter (HOSPITAL_COMMUNITY): Payer: Self-pay | Admitting: Neurological Surgery

## 2021-01-01 DIAGNOSIS — G4733 Obstructive sleep apnea (adult) (pediatric): Secondary | ICD-10-CM | POA: Diagnosis present

## 2021-01-01 DIAGNOSIS — Z4789 Encounter for other orthopedic aftercare: Secondary | ICD-10-CM | POA: Diagnosis present

## 2021-01-01 DIAGNOSIS — Z86718 Personal history of other venous thrombosis and embolism: Secondary | ICD-10-CM | POA: Diagnosis not present

## 2021-01-01 DIAGNOSIS — I110 Hypertensive heart disease with heart failure: Secondary | ICD-10-CM | POA: Diagnosis present

## 2021-01-01 DIAGNOSIS — E860 Dehydration: Secondary | ICD-10-CM | POA: Diagnosis present

## 2021-01-01 DIAGNOSIS — E8809 Other disorders of plasma-protein metabolism, not elsewhere classified: Secondary | ICD-10-CM | POA: Diagnosis not present

## 2021-01-01 DIAGNOSIS — K5900 Constipation, unspecified: Secondary | ICD-10-CM | POA: Diagnosis present

## 2021-01-01 DIAGNOSIS — E119 Type 2 diabetes mellitus without complications: Secondary | ICD-10-CM | POA: Diagnosis present

## 2021-01-01 DIAGNOSIS — Z23 Encounter for immunization: Secondary | ICD-10-CM | POA: Diagnosis not present

## 2021-01-01 DIAGNOSIS — M4322 Fusion of spine, cervical region: Secondary | ICD-10-CM | POA: Diagnosis not present

## 2021-01-01 DIAGNOSIS — Z8249 Family history of ischemic heart disease and other diseases of the circulatory system: Secondary | ICD-10-CM | POA: Diagnosis not present

## 2021-01-01 DIAGNOSIS — R0902 Hypoxemia: Secondary | ICD-10-CM | POA: Diagnosis present

## 2021-01-01 DIAGNOSIS — Z981 Arthrodesis status: Secondary | ICD-10-CM | POA: Diagnosis not present

## 2021-01-01 DIAGNOSIS — M1990 Unspecified osteoarthritis, unspecified site: Secondary | ICD-10-CM | POA: Diagnosis present

## 2021-01-01 DIAGNOSIS — I5032 Chronic diastolic (congestive) heart failure: Secondary | ICD-10-CM | POA: Diagnosis not present

## 2021-01-01 DIAGNOSIS — M109 Gout, unspecified: Secondary | ICD-10-CM | POA: Diagnosis present

## 2021-01-01 DIAGNOSIS — K219 Gastro-esophageal reflux disease without esophagitis: Secondary | ICD-10-CM | POA: Diagnosis not present

## 2021-01-01 DIAGNOSIS — Z419 Encounter for procedure for purposes other than remedying health state, unspecified: Secondary | ICD-10-CM

## 2021-01-01 DIAGNOSIS — D539 Nutritional anemia, unspecified: Secondary | ICD-10-CM | POA: Diagnosis not present

## 2021-01-01 DIAGNOSIS — S80212A Abrasion, left knee, initial encounter: Secondary | ICD-10-CM | POA: Diagnosis present

## 2021-01-01 DIAGNOSIS — X58XXXA Exposure to other specified factors, initial encounter: Secondary | ICD-10-CM | POA: Diagnosis present

## 2021-01-01 DIAGNOSIS — I482 Chronic atrial fibrillation, unspecified: Secondary | ICD-10-CM | POA: Diagnosis not present

## 2021-01-01 DIAGNOSIS — G8250 Quadriplegia, unspecified: Secondary | ICD-10-CM | POA: Diagnosis present

## 2021-01-01 DIAGNOSIS — E46 Unspecified protein-calorie malnutrition: Secondary | ICD-10-CM | POA: Diagnosis not present

## 2021-01-01 DIAGNOSIS — I4819 Other persistent atrial fibrillation: Secondary | ICD-10-CM | POA: Diagnosis not present

## 2021-01-01 DIAGNOSIS — Z808 Family history of malignant neoplasm of other organs or systems: Secondary | ICD-10-CM

## 2021-01-01 DIAGNOSIS — R059 Cough, unspecified: Secondary | ICD-10-CM | POA: Diagnosis not present

## 2021-01-01 DIAGNOSIS — G959 Disease of spinal cord, unspecified: Secondary | ICD-10-CM | POA: Diagnosis present

## 2021-01-01 DIAGNOSIS — M4802 Spinal stenosis, cervical region: Secondary | ICD-10-CM | POA: Diagnosis not present

## 2021-01-01 DIAGNOSIS — E782 Mixed hyperlipidemia: Secondary | ICD-10-CM | POA: Diagnosis present

## 2021-01-01 DIAGNOSIS — E663 Overweight: Secondary | ICD-10-CM | POA: Diagnosis present

## 2021-01-01 DIAGNOSIS — I10 Essential (primary) hypertension: Secondary | ICD-10-CM | POA: Diagnosis not present

## 2021-01-01 DIAGNOSIS — Z79899 Other long term (current) drug therapy: Secondary | ICD-10-CM

## 2021-01-01 DIAGNOSIS — M7989 Other specified soft tissue disorders: Secondary | ICD-10-CM | POA: Diagnosis not present

## 2021-01-01 DIAGNOSIS — M4312 Spondylolisthesis, cervical region: Principal | ICD-10-CM | POA: Diagnosis present

## 2021-01-01 DIAGNOSIS — R7303 Prediabetes: Secondary | ICD-10-CM | POA: Diagnosis not present

## 2021-01-01 DIAGNOSIS — E876 Hypokalemia: Secondary | ICD-10-CM | POA: Diagnosis not present

## 2021-01-01 DIAGNOSIS — M4712 Other spondylosis with myelopathy, cervical region: Secondary | ICD-10-CM | POA: Diagnosis present

## 2021-01-01 DIAGNOSIS — M25552 Pain in left hip: Secondary | ICD-10-CM

## 2021-01-01 DIAGNOSIS — M5000 Cervical disc disorder with myelopathy, unspecified cervical region: Secondary | ICD-10-CM | POA: Diagnosis not present

## 2021-01-01 DIAGNOSIS — S80211A Abrasion, right knee, initial encounter: Secondary | ICD-10-CM | POA: Diagnosis present

## 2021-01-01 DIAGNOSIS — Z6828 Body mass index (BMI) 28.0-28.9, adult: Secondary | ICD-10-CM | POA: Diagnosis not present

## 2021-01-01 DIAGNOSIS — Z993 Dependence on wheelchair: Secondary | ICD-10-CM

## 2021-01-01 DIAGNOSIS — Z7901 Long term (current) use of anticoagulants: Secondary | ICD-10-CM | POA: Diagnosis not present

## 2021-01-01 DIAGNOSIS — Z87891 Personal history of nicotine dependence: Secondary | ICD-10-CM | POA: Diagnosis not present

## 2021-01-01 DIAGNOSIS — F419 Anxiety disorder, unspecified: Secondary | ICD-10-CM | POA: Diagnosis present

## 2021-01-01 DIAGNOSIS — Z9981 Dependence on supplemental oxygen: Secondary | ICD-10-CM | POA: Diagnosis not present

## 2021-01-01 HISTORY — DX: Disease of spinal cord, unspecified: G95.9

## 2021-01-01 HISTORY — PX: ANTERIOR CERVICAL DECOMP/DISCECTOMY FUSION: SHX1161

## 2021-01-01 LAB — CBC
HCT: 36.2 % — ABNORMAL LOW (ref 39.0–52.0)
Hemoglobin: 12.2 g/dL — ABNORMAL LOW (ref 13.0–17.0)
MCH: 33.9 pg (ref 26.0–34.0)
MCHC: 33.7 g/dL (ref 30.0–36.0)
MCV: 100.6 fL — ABNORMAL HIGH (ref 80.0–100.0)
Platelets: 310 10*3/uL (ref 150–400)
RBC: 3.6 MIL/uL — ABNORMAL LOW (ref 4.22–5.81)
RDW: 14.1 % (ref 11.5–15.5)
WBC: 9.6 10*3/uL (ref 4.0–10.5)
nRBC: 0 % (ref 0.0–0.2)

## 2021-01-01 LAB — CREATININE, SERUM
Creatinine, Ser: 0.73 mg/dL (ref 0.61–1.24)
GFR, Estimated: >60 mL/min (ref >60)

## 2021-01-01 LAB — GLUCOSE, CAPILLARY
Glucose-Capillary: 98 mg/dL (ref 70–99)
Glucose-Capillary: 87 mg/dL (ref 70–99)
Glucose-Capillary: 92 mg/dL (ref 70–99)
Glucose-Capillary: 124 mg/dL — ABNORMAL HIGH (ref 70–99)

## 2021-01-01 LAB — ABO/RH: ABO/RH(D): O POS

## 2021-01-01 IMAGING — RF DG CERVICAL SPINE 2 OR 3 VIEWS
1 series · 2 of 2 positions shown · non-contrast
Comparison: MRI [DATE]

CLINICAL DATA: Status post C3-4 ACDF

EXAM:
CERVICAL SPINE - 2-3 VIEW

[Series 1: unknown protocol · 0.14mm/px · 2 of 2 slices shown]
[im 1/2]
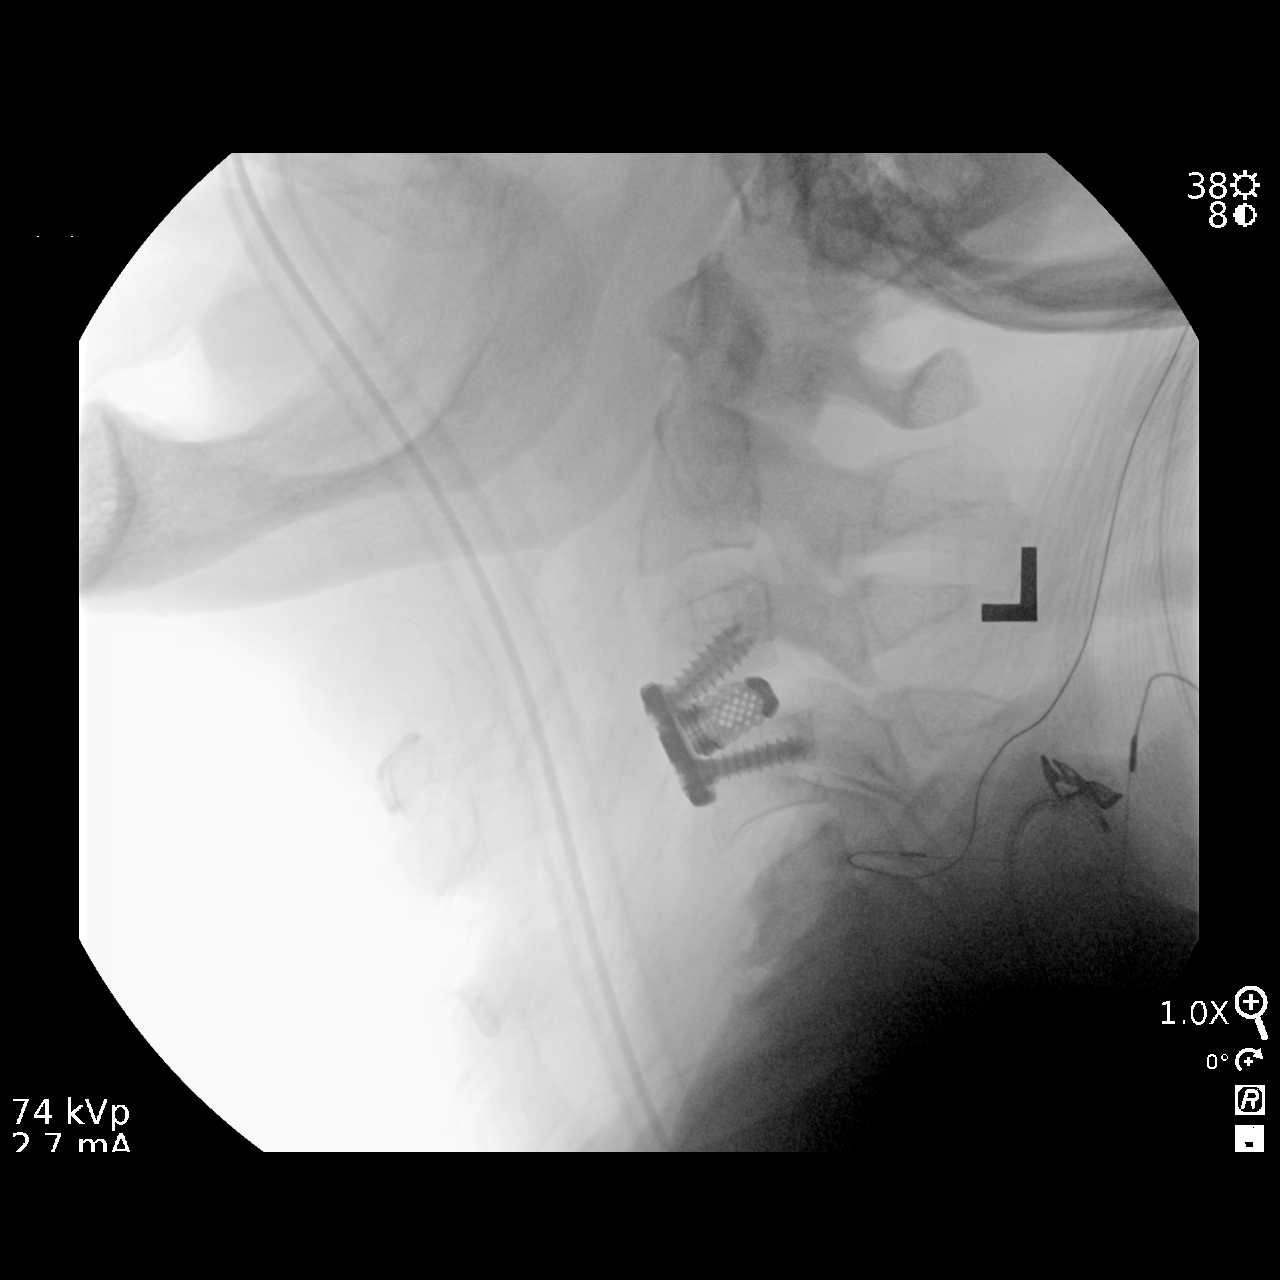
[im 2/2]
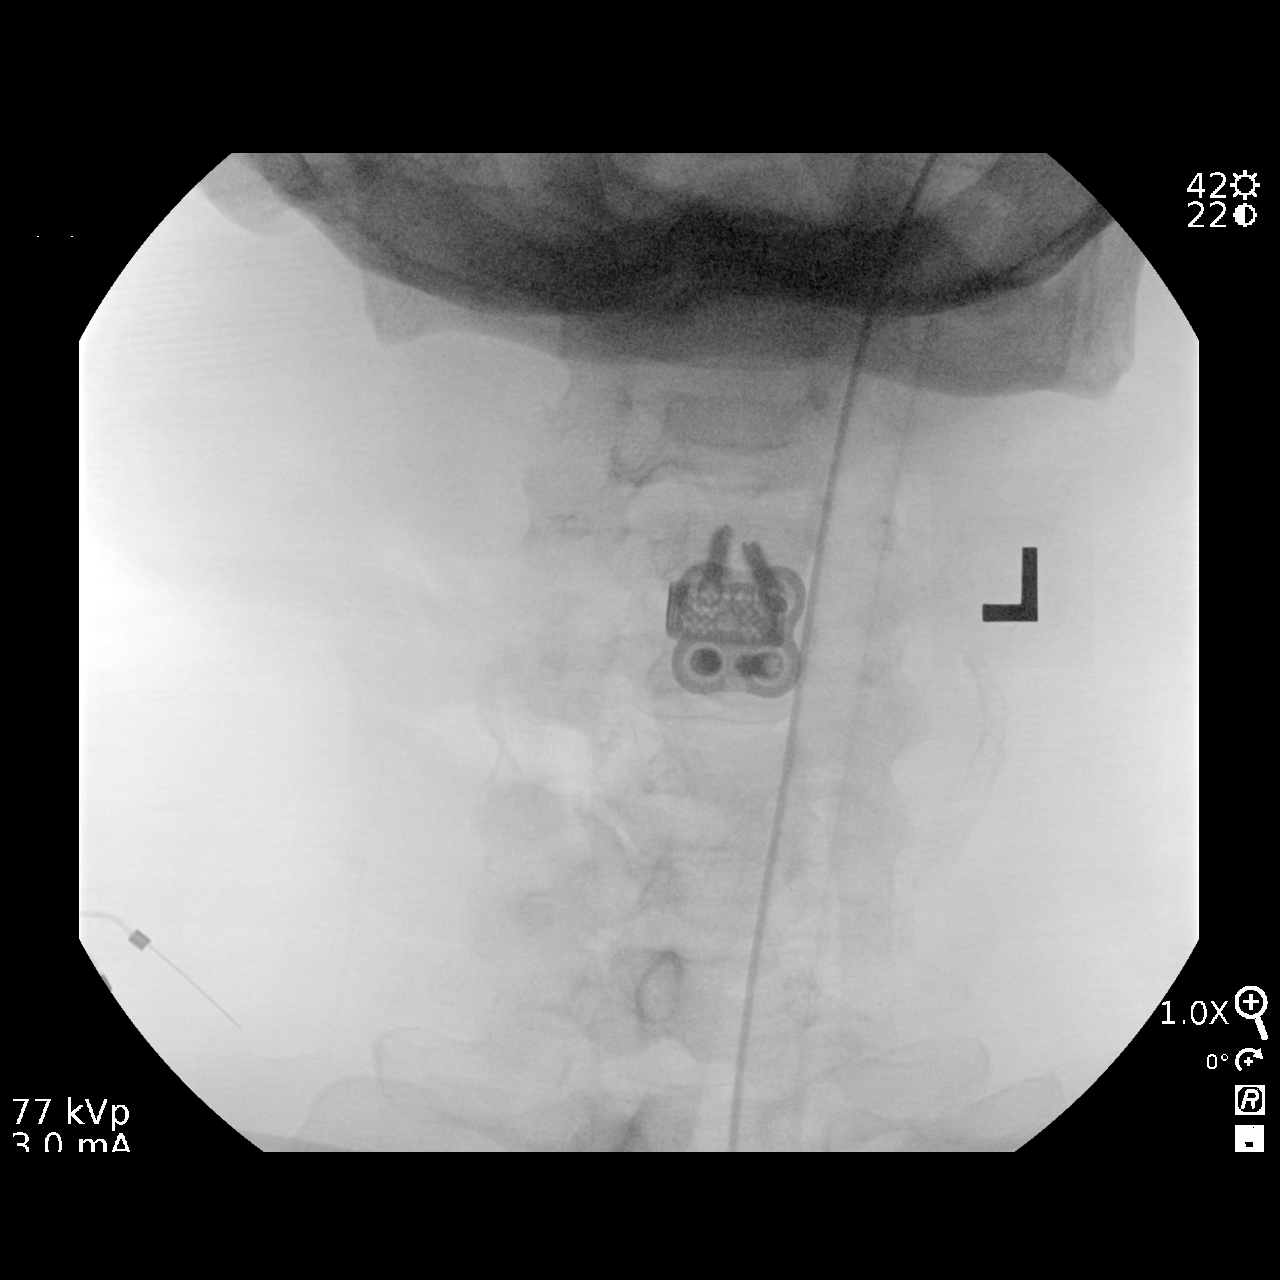

[2 of 2 positions shown; findings below may reference images not displayed]

FINDINGS: Anterior and interbody fusion hardware noted at C3-4. The hardware
is in good position. No complicating features are identified.
Expected mild postoperative prevertebral soft tissue swelling.
IMPRESSION: Anterior and interbody fusion hardware at C3-4.

## 2021-01-01 SURGERY — ANTERIOR CERVICAL DECOMPRESSION/DISCECTOMY FUSION 1 LEVEL
Anesthesia: General | Site: Neck

## 2021-01-01 MED ORDER — ONDANSETRON HCL 4 MG/2ML IJ SOLN
INTRAMUSCULAR | Status: AC
  Filled 2021-01-01: qty 2

## 2021-01-01 MED ORDER — LACTATED RINGERS IV SOLN: INTRAVENOUS | Status: DC | PRN

## 2021-01-01 MED ORDER — ACETAMINOPHEN 500 MG PO TABS
1000.0000 mg | ORAL_TABLET | Freq: Once | ORAL | Status: AC
  Administered 2021-01-01: 1000 mg via ORAL
  Filled 2021-01-01: qty 2

## 2021-01-01 MED ORDER — CEFAZOLIN SODIUM-DEXTROSE 2-4 GM/100ML-% IV SOLN: 2.0000 g | Freq: Three times a day (TID) | INTRAVENOUS | Status: DC

## 2021-01-01 MED ORDER — ROCURONIUM BROMIDE 10 MG/ML (PF) SYRINGE
PREFILLED_SYRINGE | INTRAVENOUS | Status: AC
  Filled 2021-01-01: qty 10

## 2021-01-01 MED ORDER — OXYCODONE HCL 5 MG PO TABS: 10.0000 mg | ORAL_TABLET | ORAL | Status: DC | PRN

## 2021-01-01 MED ORDER — ACETAMINOPHEN 650 MG RE SUPP: 650.0000 mg | RECTAL | Status: DC | PRN

## 2021-01-01 MED ORDER — THROMBIN 5000 UNITS EX SOLR: OROMUCOSAL | Status: DC | PRN

## 2021-01-01 MED ORDER — FENTANYL CITRATE (PF) 250 MCG/5ML IJ SOLN
INTRAMUSCULAR | Status: AC
  Filled 2021-01-01: qty 5

## 2021-01-01 MED ORDER — LIDOCAINE 2% (20 MG/ML) 5 ML SYRINGE
INTRAMUSCULAR | Status: AC
  Filled 2021-01-01: qty 5

## 2021-01-01 MED ORDER — INFLUENZA VAC A&B SA ADJ QUAD 0.5 ML IM PRSY
0.5000 mL | PREFILLED_SYRINGE | INTRAMUSCULAR | Status: AC
  Administered 2021-01-03: 0.5 mL via INTRAMUSCULAR
  Filled 2021-01-01: qty 0.5

## 2021-01-01 MED ORDER — FUROSEMIDE 40 MG PO TABS
40.0000 mg | ORAL_TABLET | Freq: Every day | ORAL | Status: DC
  Administered 2021-01-01 – 2021-01-03 (×3): 40 mg via ORAL
  Filled 2021-01-01 (×3): qty 1

## 2021-01-01 MED ORDER — CEFAZOLIN SODIUM-DEXTROSE 2-4 GM/100ML-% IV SOLN
2.0000 g | Freq: Three times a day (TID) | INTRAVENOUS | Status: DC
  Filled 2021-01-01 (×2): qty 100

## 2021-01-01 MED ORDER — SODIUM CHLORIDE 0.9% FLUSH
3.0000 mL | Freq: Two times a day (BID) | INTRAVENOUS | Status: DC
  Administered 2021-01-02 – 2021-01-03 (×2): 3 mL via INTRAVENOUS

## 2021-01-01 MED ORDER — 0.9 % SODIUM CHLORIDE (POUR BTL) OPTIME
TOPICAL | Status: DC | PRN
  Administered 2021-01-01: 1000 mL

## 2021-01-01 MED ORDER — CEFAZOLIN SODIUM-DEXTROSE 2-4 GM/100ML-% IV SOLN
2.0000 g | INTRAVENOUS | Status: AC
  Administered 2021-01-01: 2 g via INTRAVENOUS

## 2021-01-01 MED ORDER — FLUTICASONE PROPIONATE 50 MCG/ACT NA SUSP: 1.0000 | Freq: Every day | NASAL | Status: DC | PRN

## 2021-01-01 MED ORDER — SODIUM CHLORIDE 0.9 % IV SOLN: 250.0000 mL | INTRAVENOUS | Status: DC

## 2021-01-01 MED ORDER — CARVEDILOL 12.5 MG PO TABS
12.5000 mg | ORAL_TABLET | Freq: Two times a day (BID) | ORAL | Status: DC
  Administered 2021-01-01 – 2021-01-03 (×4): 12.5 mg via ORAL
  Filled 2021-01-01 (×4): qty 1

## 2021-01-01 MED ORDER — SODIUM CHLORIDE 0.9% FLUSH: 3.0000 mL | INTRAVENOUS | Status: DC | PRN

## 2021-01-01 MED ORDER — PHENYLEPHRINE HCL-NACL 20-0.9 MG/250ML-% IV SOLN
INTRAVENOUS | Status: DC | PRN
  Administered 2021-01-01: 50 ug/min via INTRAVENOUS

## 2021-01-01 MED ORDER — PANTOPRAZOLE SODIUM 40 MG PO TBEC
40.0000 mg | DELAYED_RELEASE_TABLET | Freq: Every day | ORAL | Status: DC
  Administered 2021-01-01 – 2021-01-03 (×3): 40 mg via ORAL
  Filled 2021-01-01 (×3): qty 1

## 2021-01-01 MED ORDER — ONDANSETRON HCL 4 MG/2ML IJ SOLN
INTRAMUSCULAR | Status: DC | PRN
  Administered 2021-01-01: 4 mg via INTRAVENOUS

## 2021-01-01 MED ORDER — ONDANSETRON HCL 4 MG PO TABS
4.0000 mg | ORAL_TABLET | Freq: Four times a day (QID) | ORAL | Status: DC | PRN
Start: 2021-01-01 — End: 2021-01-03

## 2021-01-01 MED ORDER — PHENYLEPHRINE 40 MCG/ML (10ML) SYRINGE FOR IV PUSH (FOR BLOOD PRESSURE SUPPORT)
PREFILLED_SYRINGE | INTRAVENOUS | Status: DC | PRN
  Administered 2021-01-01 (×2): 160 ug via INTRAVENOUS

## 2021-01-01 MED ORDER — AMISULPRIDE (ANTIEMETIC) 5 MG/2ML IV SOLN: 10.0000 mg | Freq: Once | INTRAVENOUS | Status: DC | PRN

## 2021-01-01 MED ORDER — ROCURONIUM BROMIDE 10 MG/ML (PF) SYRINGE
PREFILLED_SYRINGE | INTRAVENOUS | Status: DC | PRN
  Administered 2021-01-01: 20 mg via INTRAVENOUS
  Administered 2021-01-01: 40 mg via INTRAVENOUS

## 2021-01-01 MED ORDER — POTASSIUM CHLORIDE CRYS ER 10 MEQ PO TBCR
10.0000 meq | EXTENDED_RELEASE_TABLET | Freq: Every day | ORAL | Status: DC
  Administered 2021-01-01 – 2021-01-03 (×3): 10 meq via ORAL
  Filled 2021-01-01 (×3): qty 1

## 2021-01-01 MED ORDER — PHENYLEPHRINE 40 MCG/ML (10ML) SYRINGE FOR IV PUSH (FOR BLOOD PRESSURE SUPPORT)
PREFILLED_SYRINGE | INTRAVENOUS | Status: AC
  Filled 2021-01-01: qty 10

## 2021-01-01 MED ORDER — PHENOL 1.4 % MT LIQD: 1.0000 | OROMUCOSAL | Status: DC | PRN

## 2021-01-01 MED ORDER — LORATADINE 10 MG PO TABS: 10.0000 mg | ORAL_TABLET | Freq: Every day | ORAL | Status: DC | PRN

## 2021-01-01 MED ORDER — ACETAMINOPHEN 325 MG PO TABS: 650.0000 mg | ORAL_TABLET | ORAL | Status: DC | PRN

## 2021-01-01 MED ORDER — CHLORHEXIDINE GLUCONATE CLOTH 2 % EX PADS: 6.0000 | MEDICATED_PAD | Freq: Once | CUTANEOUS | Status: DC

## 2021-01-01 MED ORDER — SODIUM CHLORIDE 0.9% FLUSH: 3.0000 mL | Freq: Two times a day (BID) | INTRAVENOUS | Status: DC

## 2021-01-01 MED ORDER — DEXAMETHASONE SODIUM PHOSPHATE 10 MG/ML IJ SOLN
INTRAMUSCULAR | Status: DC | PRN
  Administered 2021-01-01: 5 mg via INTRAVENOUS

## 2021-01-01 MED ORDER — THROMBIN 5000 UNITS EX SOLR
CUTANEOUS | Status: AC
  Filled 2021-01-01: qty 15000

## 2021-01-01 MED ORDER — FENTANYL CITRATE (PF) 100 MCG/2ML IJ SOLN
25.0000 ug | INTRAMUSCULAR | Status: DC | PRN
  Administered 2021-01-01: 50 ug via INTRAVENOUS

## 2021-01-01 MED ORDER — HEPARIN SODIUM (PORCINE) 5000 UNIT/ML IJ SOLN
5000.0000 [IU] | Freq: Two times a day (BID) | INTRAMUSCULAR | Status: DC
  Administered 2021-01-02 – 2021-01-03 (×3): 5000 [IU] via SUBCUTANEOUS
  Filled 2021-01-01 (×3): qty 1

## 2021-01-01 MED ORDER — FENTANYL CITRATE (PF) 100 MCG/2ML IJ SOLN
INTRAMUSCULAR | Status: AC
  Filled 2021-01-01: qty 2

## 2021-01-01 MED ORDER — LOSARTAN POTASSIUM 50 MG PO TABS
100.0000 mg | ORAL_TABLET | Freq: Every day | ORAL | Status: DC
  Administered 2021-01-01 – 2021-01-03 (×3): 100 mg via ORAL
  Filled 2021-01-01 (×3): qty 2

## 2021-01-01 MED ORDER — EPHEDRINE 5 MG/ML INJ
INTRAVENOUS | Status: AC
  Filled 2021-01-01: qty 5

## 2021-01-01 MED ORDER — ALUM & MAG HYDROXIDE-SIMETH 200-200-20 MG/5ML PO SUSP: 30.0000 mL | Freq: Four times a day (QID) | ORAL | Status: DC | PRN

## 2021-01-01 MED ORDER — DEXAMETHASONE SODIUM PHOSPHATE 10 MG/ML IJ SOLN
INTRAMUSCULAR | Status: AC
  Filled 2021-01-01: qty 1

## 2021-01-01 MED ORDER — SENNOSIDES-DOCUSATE SODIUM 8.6-50 MG PO TABS: 1.0000 | ORAL_TABLET | Freq: Every evening | ORAL | Status: DC | PRN

## 2021-01-01 MED ORDER — MENTHOL 3 MG MT LOZG: 1.0000 | LOZENGE | OROMUCOSAL | Status: DC | PRN

## 2021-01-01 MED ORDER — EPHEDRINE SULFATE 50 MG/ML IJ SOLN
INTRAMUSCULAR | Status: DC | PRN
  Administered 2021-01-01: 10 mg via INTRAVENOUS
  Administered 2021-01-01: 5 mg via INTRAVENOUS
  Administered 2021-01-01: 10 mg via INTRAVENOUS

## 2021-01-01 MED ORDER — HYDROMORPHONE HCL 1 MG/ML IJ SOLN: 0.5000 mg | INTRAMUSCULAR | Status: DC | PRN

## 2021-01-01 MED ORDER — CHLORHEXIDINE GLUCONATE 0.12 % MT SOLN: 15.0000 mL | Freq: Once | OROMUCOSAL | Status: AC

## 2021-01-01 MED ORDER — ORAL CARE MOUTH RINSE: 15.0000 mL | Freq: Once | OROMUCOSAL | Status: AC

## 2021-01-01 MED ORDER — LIDOCAINE 2% (20 MG/ML) 5 ML SYRINGE
INTRAMUSCULAR | Status: DC | PRN
Start: 2021-01-01 — End: 2021-01-01
  Administered 2021-01-01: 60 mg via INTRAVENOUS

## 2021-01-01 MED ORDER — ONDANSETRON HCL 4 MG PO TABS: 4.0000 mg | ORAL_TABLET | Freq: Four times a day (QID) | ORAL | Status: DC | PRN

## 2021-01-01 MED ORDER — LACTATED RINGERS IV SOLN: INTRAVENOUS | Status: DC

## 2021-01-01 MED ORDER — TRIAMCINOLONE ACETONIDE 40 MG/ML IJ SUSP
INTRAMUSCULAR | Status: AC
  Filled 2021-01-01: qty 5

## 2021-01-01 MED ORDER — SODIUM CHLORIDE 0.9 % IV SOLN: INTRAVENOUS | Status: DC

## 2021-01-01 MED ORDER — CEFAZOLIN SODIUM-DEXTROSE 2-4 GM/100ML-% IV SOLN
INTRAVENOUS | Status: AC
  Filled 2021-01-01: qty 100

## 2021-01-01 MED ORDER — ONDANSETRON HCL 4 MG/2ML IJ SOLN: 4.0000 mg | Freq: Four times a day (QID) | INTRAMUSCULAR | Status: DC | PRN

## 2021-01-01 MED ORDER — SUGAMMADEX SODIUM 200 MG/2ML IV SOLN
INTRAVENOUS | Status: DC | PRN
  Administered 2021-01-01: 200 mg via INTRAVENOUS

## 2021-01-01 MED ORDER — CHLORHEXIDINE GLUCONATE 0.12 % MT SOLN
OROMUCOSAL | Status: AC
  Administered 2021-01-01: 15 mL via OROMUCOSAL
  Filled 2021-01-01: qty 15

## 2021-01-01 MED ORDER — PROPOFOL 10 MG/ML IV BOLUS
INTRAVENOUS | Status: DC | PRN
  Administered 2021-01-01: 120 mg via INTRAVENOUS

## 2021-01-01 MED ORDER — ONDANSETRON HCL 4 MG/2ML IJ SOLN
4.0000 mg | Freq: Four times a day (QID) | INTRAMUSCULAR | Status: DC | PRN
Start: 2021-01-01 — End: 2021-01-03

## 2021-01-01 MED ORDER — FENTANYL CITRATE (PF) 250 MCG/5ML IJ SOLN
INTRAMUSCULAR | Status: DC | PRN
  Administered 2021-01-01: 100 ug via INTRAVENOUS

## 2021-01-01 MED ORDER — HYDROCODONE-ACETAMINOPHEN 7.5-325 MG PO TABS
1.0000 | ORAL_TABLET | ORAL | Status: DC | PRN
Start: 2021-01-01 — End: 2021-01-01

## 2021-01-01 MED ORDER — CEFAZOLIN SODIUM-DEXTROSE 2-4 GM/100ML-% IV SOLN
2.0000 g | Freq: Three times a day (TID) | INTRAVENOUS | Status: AC
Start: 2021-01-02 — End: 2021-01-02
  Administered 2021-01-01 – 2021-01-02 (×2): 2 g via INTRAVENOUS
  Filled 2021-01-01 (×2): qty 100

## 2021-01-01 SURGICAL SUPPLY — 60 items
APL SKNCLS STERI-STRIP NONHPOA (GAUZE/BANDAGES/DRESSINGS)
BAG COUNTER SPONGE SURGICOUNT (BAG) ×2 IMPLANT
BAG SPNG CNTER NS LX DISP (BAG) ×1
BAND INSRT 18 STRL LF DISP RB (MISCELLANEOUS) ×2
BAND RUBBER #18 3X1/16 STRL (MISCELLANEOUS) ×4 IMPLANT
BENZOIN TINCTURE PRP APPL 2/3 (GAUZE/BANDAGES/DRESSINGS) IMPLANT
BIT DRILL NEURO 2X3.1 SFT TUCH (MISCELLANEOUS) ×1 IMPLANT
BLADE CLIPPER SURG (BLADE) IMPLANT
BUR CARBIDE MATCH 3.0 (BURR) ×2 IMPLANT
CANISTER SUCT 3000ML PPV (MISCELLANEOUS) ×2 IMPLANT
CARTRIDGE OIL MAESTRO DRILL (MISCELLANEOUS) ×1 IMPLANT
COVER MAYO STAND STRL (DRAPES) ×4 IMPLANT
DIFFUSER DRILL AIR PNEUMATIC (MISCELLANEOUS) ×2 IMPLANT
DRAPE C-ARM 42X72 X-RAY (DRAPES) ×2 IMPLANT
DRAPE HALF SHEET 40X57 (DRAPES) IMPLANT
DRAPE LAPAROTOMY 100X72X124 (DRAPES) ×2 IMPLANT
DRAPE MICROSCOPE LEICA (MISCELLANEOUS) ×2 IMPLANT
DRILL NEURO 2X3.1 SOFT TOUCH (MISCELLANEOUS) ×2
DURAPREP 6ML APPLICATOR 50/CS (WOUND CARE) ×2 IMPLANT
ELECT COATED BLADE 2.86 ST (ELECTRODE) ×2 IMPLANT
ELECT REM PT RETURN 9FT ADLT (ELECTROSURGICAL) ×2
ELECTRODE REM PT RTRN 9FT ADLT (ELECTROSURGICAL) ×1 IMPLANT
EVACUATOR 1/8 PVC DRAIN (DRAIN) IMPLANT
GAUZE 4X4 16PLY ~~LOC~~+RFID DBL (SPONGE) IMPLANT
GLOVE EXAM NITRILE LRG STRL (GLOVE) IMPLANT
GLOVE EXAM NITRILE XL STR (GLOVE) IMPLANT
GLOVE EXAM NITRILE XS STR PU (GLOVE) IMPLANT
GLOVE SRG 8 PF TXTR STRL LF DI (GLOVE) ×1 IMPLANT
GLOVE SURG LTX SZ8 (GLOVE) ×4 IMPLANT
GLOVE SURG UNDER POLY LF SZ8 (GLOVE) ×2
GOWN STRL REUS W/ TWL LRG LVL3 (GOWN DISPOSABLE) IMPLANT
GOWN STRL REUS W/ TWL XL LVL3 (GOWN DISPOSABLE) ×1 IMPLANT
GOWN STRL REUS W/TWL 2XL LVL3 (GOWN DISPOSABLE) IMPLANT
GOWN STRL REUS W/TWL LRG LVL3 (GOWN DISPOSABLE)
GOWN STRL REUS W/TWL XL LVL3 (GOWN DISPOSABLE) ×2
HEMOSTAT POWDER KIT SURGIFOAM (HEMOSTASIS) ×2 IMPLANT
KIT BASIN OR (CUSTOM PROCEDURE TRAY) ×2 IMPLANT
KIT TURNOVER KIT B (KITS) ×2 IMPLANT
MODULE EMG NEEDLE SSEP NVM5 (NEEDLE) ×2 IMPLANT
NEEDLE HYPO 22GX1.5 SAFETY (NEEDLE) ×2 IMPLANT
NEEDLE SPNL 18GX3.5 QUINCKE PK (NEEDLE) ×2 IMPLANT
NS IRRIG 1000ML POUR BTL (IV SOLUTION) ×2 IMPLANT
OIL CARTRIDGE MAESTRO DRILL (MISCELLANEOUS) ×2
PACK LAMINECTOMY NEURO (CUSTOM PROCEDURE TRAY) ×2 IMPLANT
PAD ARMBOARD 7.5X6 YLW CONV (MISCELLANEOUS) ×6 IMPLANT
PIN DISTRACTION 14MM (PIN) ×4 IMPLANT
PLATE CERV CONS OZARK 1X18 (Plate) ×2 IMPLANT
PUTTY BONE 100 VESUVIUS 1CC (Putty) ×2 IMPLANT
SCREW FIXED ST OZARK 4X16 (Screw) ×4 IMPLANT
SCREW VA ST OZARK 4X16 (Plate) ×4 IMPLANT
SPACER ANGLD CASCAD 16X13X7 7D (Spacer) ×2 IMPLANT
SPONGE INTESTINAL PEANUT (DISPOSABLE) ×2 IMPLANT
SPONGE SURGIFOAM ABS GEL SZ50 (HEMOSTASIS) ×2 IMPLANT
STAPLER VISISTAT 35W (STAPLE) IMPLANT
STRIP CLOSURE SKIN 1/2X4 (GAUZE/BANDAGES/DRESSINGS) ×2 IMPLANT
TAPE SURG TRANSPORE 1 IN (GAUZE/BANDAGES/DRESSINGS) ×1 IMPLANT
TAPE SURGICAL TRANSPORE 1 IN (GAUZE/BANDAGES/DRESSINGS) ×1
TOWEL GREEN STERILE (TOWEL DISPOSABLE) ×2 IMPLANT
TOWEL GREEN STERILE FF (TOWEL DISPOSABLE) ×2 IMPLANT
WATER STERILE IRR 1000ML POUR (IV SOLUTION) ×2 IMPLANT

## 2021-01-01 NOTE — Op Note (Signed)
Date of service: 01/01/2021  PREOP DIAGNOSIS: Cervical spondylosis with myelopathy due to severe stenosis and degenerative spondylolisthesis at C3-4  POSTOP DIAGNOSIS: Same  PROCEDURE: 1. Arthrodesis C3-4, anterior interbody technique  2. Placement of intervertebral biomechanical device C3-4, K2M [place] 7 mm interbody 3. Placement of anterior instrumentation consisting of interbody plate and screws -K2 M [place] plate, 16 mm screws 4. Discectomy at C3-4 for decompression of spinal cord and exiting nerve roots  5. Use of morselized bone allograft  6.  Use of autograft, same incision 7. Use of intraoperative microscope 8.  Intraoperative use of neuro monitoring, SSEPs  SURGEON: Dr. Pieter Partridge Merlyn Bollen, DO  ASSISTANT: Weston Brass, NP  ANESTHESIA: General Endotracheal  EBL: 30 cc  SPECIMENS: None  DRAINS: None  COMPLICATIONS: None immediate  CONDITION: Hemodynamically stable to PACU  HISTORY: Logan [name] is a 75 y.o. y.o. male who initially presented to the outpatient clinic with signs and symptoms consistent with myelopathy. MRI demonstrated severe stenosis at C3-4 with a grade 1 degenerative spondylolisthesis.  There is questionable cord signal change however there is motion degradation of the MRI.  CT of the cervical spine revealed similar with other degenerative changes at C4-5 and C5-6 there were less stenotic.  He had progressively complained of weakness, numbness and tingling over the past few years, but worsening over the past 6 months.  He was using four-wheel walker however this progressed to a wheelchair over the past few months. Treatment options were discussed including observation versus surgical intervention.  Given his neurologic decline, I recommended anterior cervical discectomy and fusion at C3-4. After all questions were answered, informed consent was obtained.  Medical and cardiac clearance was obtained.  PROCEDURE IN DETAIL: The patient was brought to the operating room  and transferred to the operative table. After induction of general anesthesia, the patient was positioned on the operative table in the supine position with all pressure points meticulously padded.  Prepositional SSEPs were obtained and noted to be monitorable.  The neck was then placed in gentle extension.  Post positional SSEPs were noted to be stable.  The skin of the neck was then prepped and draped in the usual sterile fashion.  After timeout was conducted, skin incision was then made sharply with a 10 blade and Bovie electrocautery was used to dissect the subcutaneous tissue until the platysma was identified. The platysma was then divided and undermined. The sternocleidomastoid muscle was then identified and, utilizing natural fascial planes in the neck, the prevertebral fascia was identified and the carotid sheath was retracted laterally and the trachea and esophagus retracted medially. Again using fluoroscopy, the correct disc space was identified, C3-4. Bovie electrocautery was used to dissect in the subperiosteal plane and elevate the bilateral longus coli muscles. Self-retaining retractors were then placed under the longus coli muscles bilaterally. At this point, the microscope was draped and brought into the field, and the remainder of the case was done under the microscope using microdissecting technique.  Distraction pins were placed in midline in the C3 and C4 vertebral bodies.  The disc space was incised sharply and rongeurs were use to initially complete a discectomy. The high-speed drill was then used to complete discectomy until the posterior annulus was identified and removed and the posterior longitudinal ligament was identified.  The disc base was placed under distraction. Using microcurette, the PLL was elevated, and Kerrison rongeurs were used to remove the posterior longitudinal ligament and the ventral thecal sac was identified. Using a combination of curettes and  ronguers, complete  decompression of the thecal sac and exiting nerve roots at this level was completed, and verified using micro-nerve hook.  Epidural hemostasis was achieved with Surgiflo.  Using a high-speed drill the uncovertebral joints were decorticated and the autologous bone was placed laterally in the uncovertebral joints.  The disc base was taken out of distraction.  Having completed our decompression, attention was turned to placement of the intervertebral device. Trial spacers were used to select a 7 mm graft. This graft was then filled with morcellized allograft, and inserted under live fluoroscopy.  Distraction pins were removed, hemostasis was achieved with Surgi-Flo.  After placement of the intervertebral device, the above anterior cervical plate was selected, and placed across the interspace. Using a high-speed drill, the cortex of the cervical vertebral bodies was punctured, and screws inserted in the level above and below at C3 and C4. Final fluoroscopic images in AP and lateral projections were taken to confirm good hardware placement.  At this point, after all counts were verified to be correct, meticulous hemostasis was secured using a combination of bipolar electrocautery and passive hemostatics.  Skin was closed with staples.  Sterile dressing was applied.  Throughout the entirety of the case, the SSEPs were stable.  The patient tolerated the procedure well and was extubated in the room and taken to the postanesthesia care unit in stable condition.

## 2021-01-01 NOTE — Transfer of Care (Signed)
Immediate Anesthesia Transfer of Care Note  Patient: Logan [name]  Procedure(s) Performed: ACDF C3-4 (Neck)  Patient Location: PACU  Anesthesia Type:General  Level of Consciousness: awake, alert  and oriented  Airway & Oxygen Therapy: Patient Spontanous Breathing  Post-op Assessment: Report given to RN and Post -op Vital signs reviewed and stable  Post vital signs: Reviewed and stable  Last Vitals:  Vitals Value Taken Time  BP 125/61 01/01/21 1253  Temp    Pulse 55 01/01/21 1254  Resp 14 01/01/21 1254  SpO2 100 % 01/01/21 1254  Vitals shown include unvalidated device data.  Last Pain:  Vitals:   01/01/21 0831  TempSrc:   PainSc: 8       Patients Stated Pain Goal: 2 ([id number])  Complications: No notable events documented.

## 2021-01-01 NOTE — Care Management CC44 (Signed)
Condition Code 44 Documentation Completed  Patient Details  Name: Logan [name] MRN: [id number] Date of Birth: April 15, 1946   Condition Code 44 given:  Yes Patient signature on Condition Code 44 notice:  Yes Documentation of 2 MD's agreement:  Yes Code 44 added to claim:  Yes    Fuller Mandril, RN 01/01/2021, 3:17 PM

## 2021-01-01 NOTE — Progress Notes (Signed)
Orthopedic Tech Progress Note Patient Details:  Olson Fackrell Sep 14, 1945 [id number]  Dropped off an [place] to PACU Patient ID: [name], male   DOB: [birth year], 75 y.o.   MRN: [id number]  Janit Pagan 01/01/2021, 1:25 PM

## 2021-01-01 NOTE — Care Management Obs Status (Signed)
[facility] NOTIFICATION   Patient Details  Name: Quinlin Toppi MRN: [id number] Date of Birth: 03/16/1946   Medicare Observation Status Notification Given:  Yes    Fuller Mandril, RN 01/01/2021, 3:16 PM

## 2021-01-01 NOTE — H&P (Signed)
Providing Compassionate, Quality Care - Together  NEUROSURGERY HISTORY & PHYSICAL   Logan [name] is an 75 y.o. male.   Chief Complaint: Weakness HPI: This is a 75 year old male with a history of A. fib on Eliquis, heart failure, diabetes, hypertension, sleep apnea that has had progressive cervical myelopathy with significant weakness.  He had difficulty using his walker and is now been wheelchair-bound briefly.  He complains of numbness and tingling in his hands and feet that is also progressively worsened.  MRI revealed severe stenosis at C3-4 degenerative spondylolisthesis.  He presents today for surgical intervention.  He received medical and cardiac clearance.  He has been off of his Eliquis.  Past Medical History:  Diagnosis Date   Anxiety    Atherosclerotic renal artery stenosis, unilateral (HCC) 02/28/2014   Atrial fibrillation (HCC)    Bilateral lower extremity edema    Chronic diastolic heart failure ([place]) 10/21/2019   Diabetes mellitus without complication (HCC)    DVT (deep venous thrombosis) (HCC)    Esophageal reflux    Essential hypertension 10/21/2019   Gout    Hyperlipidemia    Hypertension    Mixed hyperlipidemia 10/21/2019   Morbid obesity ([place])    Obesity (BMI 30-39.9) 08/24/2019   Orthopnea    Osteoarthritis    Persistent atrial fibrillation ([place]) 10/21/2019   Seasonal allergies    Sleep apnea     Past Surgical History:  Procedure Laterality Date   ABDOMINAL AORTAGRAM N/A 02/28/2014   Procedure: ABDOMINAL Maxcine Ham;  Surgeon: Serafina Mitchell, MD;  Location: [facility] CATH LAB;  Service: Cardiovascular;  Laterality: N/A;   APPENDECTOMY     ATRIAL FIBRILLATION ABLATION N/A 02/29/2020   Procedure: ATRIAL FIBRILLATION ABLATION;  Surgeon: Constance Haw, MD;  Location: [facility] CV LAB;  Service: Cardiovascular;  Laterality: N/A;   CARDIOVERSION N/A 10/18/2020   Procedure: CARDIOVERSION (CATH LAB);  Surgeon: Constance Haw, MD;  Location: [place] [facility] CV LAB;   Service: Cardiovascular;  Laterality: N/A;   CARPAL TUNNEL RELEASE Left     Family History  Problem Relation Age of Onset   Heart disease Mother    Cancer Father        throat cancer   Heart disease Brother    Atrial fibrillation Brother    Social History:  reports that he has quit smoking. He has quit using smokeless tobacco.  His smokeless tobacco use included chew. He reports that he does not drink alcohol and does not use drugs.  Allergies: No Known Allergies  Medications Prior to Admission  Medication Sig Dispense Refill   acetaminophen (TYLENOL) 500 MG tablet Take 1,000 mg by mouth every 6 (six) hours as needed for mild pain.     allopurinol (ZYLOPRIM) 300 MG tablet Take 300 mg by mouth daily.     apixaban (ELIQUIS) 5 MG TABS tablet Take 1 tablet (5 mg total) by mouth 2 (two) times daily. 60 tablet 5   carvedilol (COREG) 12.5 MG tablet Take 12.5 mg by mouth 2 (two) times daily with a meal.     DHEA 25 MG CAPS Take 50 mg by mouth daily.     fluticasone (FLONASE) 50 MCG/ACT nasal spray Place 1 spray into both nostrils daily as needed for rhinitis.      furosemide (LASIX) 40 MG tablet Take 1 tablet (40 mg total) by mouth daily. (Patient taking differently: Take 40 mg by mouth 2 (two) times daily.) 90 tablet 3   ketoconazole (NIZORAL) 2 % cream Apply  1 application topically daily as needed for irritation.      losartan (COZAAR) 100 MG tablet Take 100 mg by mouth daily.     Multiple Vitamin (MULTIVITAMIN) tablet Take 1 tablet by mouth daily.     omeprazole (PRILOSEC) 20 MG capsule Take 20 mg by mouth daily as needed (for heartburn).      potassium chloride (MICRO-K) 10 MEQ CR capsule Take 10 mEq by mouth daily.     traMADol (ULTRAM) 50 MG tablet Take 50 mg by mouth every 12 (twelve) hours as needed for moderate pain.     loratadine (CLARITIN) 10 MG tablet Take 10 mg by mouth daily as needed for allergies.      Results for orders placed or performed during the hospital encounter of  01/01/21 (from the past 48 hour(s))  Glucose, capillary     Status: None   Collection Time: 01/01/21  8:09 AM  Result Value Ref Range   Glucose-Capillary 98 70 - 99 mg/dL    Comment: Glucose reference range applies only to samples taken after fasting for at least 8 hours.  ABO/Rh     Status: None   Collection Time: 01/01/21  8:26 AM  Result Value Ref Range   ABO/RH(D)      O POS Performed at [facility] [address] [place], [place] [address]   Glucose, capillary     Status: None   Collection Time: 01/01/21 10:27 AM  Result Value Ref Range   Glucose-Capillary 87 70 - 99 mg/dL    Comment: Glucose reference range applies only to samples taken after fasting for at least 8 hours.   No results found.  ROS All positives and negatives are listed in HPI above  Blood pressure (!) 125/57, pulse 64, temperature 97.9 F (36.6 C), temperature source Oral, resp. rate 17, height '5\' 7"'$  (1.702 m), weight 83.5 kg, SpO2 94 %. Physical Exam  Awake alert, oriented PERRLA EOMI Bilateral delt 2/5, Bi, Tri, grip 3/5 Bilateral lower extremity 2/5 throughout Positive Hoffmann bilaterally  Assessment/Plan 75 year old male with  Cervical spondylotic myelopathy, C3-4 -Or today for C3-4 ACDF.  All risks, benefits and expected outcomes were discussed and agreed upon.  He received medical and cardiac clearance.  He has been off his Eliquis.  We did discuss likely placement of rehab postop.   Thank you for allowing me to participate in this patient's care.  Please do not hesitate to call with questions or concerns.   Elwin Sleight, [facility] Cell: [phone]

## 2021-01-01 NOTE — Anesthesia Procedure Notes (Signed)
Procedure Name: Intubation Date/Time: 01/01/2021 10:54 AM Performed by: Harden Mo, CRNA Pre-anesthesia Checklist: Patient identified, Emergency Drugs available, Suction available and Patient being monitored Patient Re-evaluated:Patient Re-evaluated prior to induction Oxygen Delivery Method: Circle System Utilized Preoxygenation: Pre-oxygenation with 100% oxygen Induction Type: IV induction Ventilation: Mask ventilation without difficulty and Oral airway inserted - appropriate to patient size Laryngoscope Size: Glidescope and 4 Grade View: Grade I Tube type: Oral Tube size: 7.5 mm Number of attempts: 1 Airway Equipment and Method: Stylet and Oral airway Placement Confirmation: ETT inserted through vocal cords under direct vision, positive ETCO2 and breath sounds checked- equal and bilateral Secured at: 23 cm Tube secured with: Tape Dental Injury: Teeth and Oropharynx as per pre-operative assessment

## 2021-01-02 ENCOUNTER — Encounter (HOSPITAL_COMMUNITY): Payer: Self-pay | Admitting: Neurological Surgery

## 2021-01-02 DIAGNOSIS — E119 Type 2 diabetes mellitus without complications: Secondary | ICD-10-CM | POA: Diagnosis present

## 2021-01-02 DIAGNOSIS — D539 Nutritional anemia, unspecified: Secondary | ICD-10-CM | POA: Diagnosis present

## 2021-01-02 DIAGNOSIS — I110 Hypertensive heart disease with heart failure: Secondary | ICD-10-CM | POA: Diagnosis present

## 2021-01-02 DIAGNOSIS — E876 Hypokalemia: Secondary | ICD-10-CM | POA: Diagnosis not present

## 2021-01-02 DIAGNOSIS — I4819 Other persistent atrial fibrillation: Secondary | ICD-10-CM | POA: Diagnosis present

## 2021-01-02 DIAGNOSIS — G8250 Quadriplegia, unspecified: Secondary | ICD-10-CM | POA: Diagnosis present

## 2021-01-02 DIAGNOSIS — E8809 Other disorders of plasma-protein metabolism, not elsewhere classified: Secondary | ICD-10-CM | POA: Diagnosis present

## 2021-01-02 DIAGNOSIS — E663 Overweight: Secondary | ICD-10-CM | POA: Diagnosis present

## 2021-01-02 DIAGNOSIS — Z87891 Personal history of nicotine dependence: Secondary | ICD-10-CM | POA: Diagnosis not present

## 2021-01-02 DIAGNOSIS — S80212A Abrasion, left knee, initial encounter: Secondary | ICD-10-CM | POA: Diagnosis present

## 2021-01-02 DIAGNOSIS — I482 Chronic atrial fibrillation, unspecified: Secondary | ICD-10-CM | POA: Diagnosis not present

## 2021-01-02 DIAGNOSIS — Z9981 Dependence on supplemental oxygen: Secondary | ICD-10-CM | POA: Diagnosis not present

## 2021-01-02 DIAGNOSIS — Z7901 Long term (current) use of anticoagulants: Secondary | ICD-10-CM | POA: Diagnosis not present

## 2021-01-02 DIAGNOSIS — Z86718 Personal history of other venous thrombosis and embolism: Secondary | ICD-10-CM | POA: Diagnosis not present

## 2021-01-02 DIAGNOSIS — R0902 Hypoxemia: Secondary | ICD-10-CM | POA: Diagnosis present

## 2021-01-02 DIAGNOSIS — Z23 Encounter for immunization: Secondary | ICD-10-CM | POA: Diagnosis present

## 2021-01-02 DIAGNOSIS — I5032 Chronic diastolic (congestive) heart failure: Secondary | ICD-10-CM | POA: Diagnosis present

## 2021-01-02 DIAGNOSIS — E46 Unspecified protein-calorie malnutrition: Secondary | ICD-10-CM | POA: Diagnosis present

## 2021-01-02 DIAGNOSIS — M1990 Unspecified osteoarthritis, unspecified site: Secondary | ICD-10-CM | POA: Diagnosis present

## 2021-01-02 DIAGNOSIS — Z993 Dependence on wheelchair: Secondary | ICD-10-CM | POA: Diagnosis not present

## 2021-01-02 DIAGNOSIS — E860 Dehydration: Secondary | ICD-10-CM | POA: Diagnosis present

## 2021-01-02 DIAGNOSIS — E782 Mixed hyperlipidemia: Secondary | ICD-10-CM | POA: Diagnosis present

## 2021-01-02 DIAGNOSIS — K219 Gastro-esophageal reflux disease without esophagitis: Secondary | ICD-10-CM | POA: Diagnosis present

## 2021-01-02 DIAGNOSIS — G959 Disease of spinal cord, unspecified: Secondary | ICD-10-CM | POA: Diagnosis not present

## 2021-01-02 DIAGNOSIS — S80211A Abrasion, right knee, initial encounter: Secondary | ICD-10-CM | POA: Diagnosis present

## 2021-01-02 DIAGNOSIS — I10 Essential (primary) hypertension: Secondary | ICD-10-CM | POA: Diagnosis not present

## 2021-01-02 DIAGNOSIS — Z8249 Family history of ischemic heart disease and other diseases of the circulatory system: Secondary | ICD-10-CM | POA: Diagnosis not present

## 2021-01-02 DIAGNOSIS — R7303 Prediabetes: Secondary | ICD-10-CM | POA: Diagnosis not present

## 2021-01-02 DIAGNOSIS — Z4789 Encounter for other orthopedic aftercare: Secondary | ICD-10-CM | POA: Diagnosis present

## 2021-01-02 DIAGNOSIS — M5000 Cervical disc disorder with myelopathy, unspecified cervical region: Secondary | ICD-10-CM | POA: Diagnosis not present

## 2021-01-02 DIAGNOSIS — M4802 Spinal stenosis, cervical region: Secondary | ICD-10-CM | POA: Diagnosis present

## 2021-01-02 DIAGNOSIS — F419 Anxiety disorder, unspecified: Secondary | ICD-10-CM | POA: Diagnosis present

## 2021-01-02 DIAGNOSIS — X58XXXA Exposure to other specified factors, initial encounter: Secondary | ICD-10-CM | POA: Diagnosis present

## 2021-01-02 DIAGNOSIS — M4312 Spondylolisthesis, cervical region: Secondary | ICD-10-CM | POA: Diagnosis present

## 2021-01-02 DIAGNOSIS — G4733 Obstructive sleep apnea (adult) (pediatric): Secondary | ICD-10-CM | POA: Diagnosis present

## 2021-01-02 DIAGNOSIS — M109 Gout, unspecified: Secondary | ICD-10-CM | POA: Diagnosis present

## 2021-01-02 DIAGNOSIS — Z79899 Other long term (current) drug therapy: Secondary | ICD-10-CM | POA: Diagnosis not present

## 2021-01-02 DIAGNOSIS — Z808 Family history of malignant neoplasm of other organs or systems: Secondary | ICD-10-CM | POA: Diagnosis not present

## 2021-01-02 DIAGNOSIS — Z6828 Body mass index (BMI) 28.0-28.9, adult: Secondary | ICD-10-CM | POA: Diagnosis not present

## 2021-01-02 DIAGNOSIS — M4712 Other spondylosis with myelopathy, cervical region: Secondary | ICD-10-CM | POA: Diagnosis present

## 2021-01-02 DIAGNOSIS — K5900 Constipation, unspecified: Secondary | ICD-10-CM | POA: Diagnosis present

## 2021-01-02 LAB — GLUCOSE, CAPILLARY: Glucose-Capillary: 111 mg/dL — ABNORMAL HIGH (ref 70–99)

## 2021-01-02 MED ORDER — BACITRACIN ZINC 500 UNIT/GM EX OINT
TOPICAL_OINTMENT | Freq: Two times a day (BID) | CUTANEOUS | Status: DC
  Filled 2021-01-02: qty 28.4

## 2021-01-02 NOTE — Plan of Care (Signed)

## 2021-01-02 NOTE — Progress Notes (Addendum)
Subjective: Patient reports that he is doing well and is pleased with his postoperative status thus far. He has had a reduction in his neck pain as well as his BUE and BLE. He also reports some improvement in the numbness and tingling in his hands and feet. Throat is mildly sore without dysphagia. No acute events overnight.  Objective: Vital signs in last 24 hours: Temp:  [97.5 F (36.4 C)-98.5 F (36.9 C)] 97.8 F (36.6 C) (09/14 0800) Pulse Rate:  [55-79] 64 (09/14 0800) Resp:  [11-19] 18 (09/14 0800) BP: (87-144)/(50-79) 133/70 (09/14 0800) SpO2:  [88 %-100 %] 100 % (09/14 0800)  Intake/Output from previous day: 09/13 0701 - 09/14 0700 In: 2186.8 [P.O.:600; I.V.:1586.8] Out: 780 [Urine:750; Blood:30] Intake/Output this shift: Total I/O In: -  Out: 125 [Urine:125]  Physical Exam: Patient is awake, A/O X 4, conversant, and in good spirits. They are in NAD and VSS. Doing well. Speech is fluent and appropriate. Right deltoid 2/5, Left deltoid 3/5, Bi, Tri, grip 3/5. BLE 2/5 throughout. PERLA, EOMI. CNs grossly intact. Dressing is dry and intact with a small amount of old drainage along the medial edge. Incision is well approximated with no drainage, erythema, or edema. Hard cervical collar in place.   Lab Results: Recent Labs    01/01/21 1829  WBC 9.6  HGB 12.2*  HCT 36.2*  PLT 310   BMET Recent Labs    01/01/21 1829  CREATININE 0.73    Studies/Results: DG Cervical Spine 2 or 3 views  Result Date: 01/01/2021 CLINICAL DATA:  Status post C3-4 ACDF EXAM: CERVICAL SPINE - 2-3 VIEW COMPARISON:  MRI 11/03/2020 FINDINGS: Anterior and interbody fusion hardware noted at C3-4. The hardware is in good position. No complicating features are identified. Expected mild postoperative prevertebral soft tissue swelling. IMPRESSION: Anterior and interbody fusion hardware at C3-4. Electronically Signed   By: Marijo Sanes M.D.   On: 01/01/2021 14:30   DG C-Arm 1-60 Min-No Report  Result  Date: 01/01/2021 Fluoroscopy was utilized by the requesting physician.  No radiographic interpretation.    Assessment/Plan: Patient is post-op day 1 s/p C3/4 ACDF. He is recovering well and reports a a reduction in his preoperative symptoms.  His only complaint is mild incisional, shoulder and upper back discomfort.  He has not ambulated yet and is awaiting PT and OT evaluation.  Continue hard cervical collar.  Continue to hold Eliquis. Plan to restart Eliquis on 01/11/21. Continue working on pain control, mobility and ambulating patient.  LOS: 1 day     Marvis Moeller, DNP, NP-C 01/02/2021, 8:19 AM   Addendum:  Patient seen and examined.  Agree with above.  He is status post C3-4 ACDF for severe cervical myelopathy.  He remains significantly debilitated.  His numbness and tingling is slightly different which is expected however he still requires rehab in the form of SNF for CIR.  Will work on consultation and placement for this.  His pain is controlled.  His dressing is clean dry and intact.  His neuro exam remained stable.   Thank you for allowing me to participate in this patient's care.  Please do not hesitate to call with questions or concerns.   Elwin Sleight, [facility] Cell: [phone]

## 2021-01-02 NOTE — Evaluation (Signed)
Occupational Therapy Evaluation Patient Details Name: Logan [name] MRN: [id number] DOB: [birth year] Today's Date: 01/02/2021   History of Present Illness Logan [name] is a 75 y.o. y.o. admitted 01/01/21 male who initially presented to the outpatient clinic with signs and symptoms consistent with myelopathy. He had progressively complained of weakness, numbness and tingling in all 4 extremities over the past few years, but worsening over the past 6 months.  He was using four-wheel walker however this progressed to a wheelchair over the past few months Pt is now s/p anterior cervical discectomy and fusion at C3-4. PMH includes A. fib on Eliquis, heart failure, diabetes, hypertension, sleep apnea   Clinical Impression   Pt has been progressively getting weaker over the past 6 months, been using a Rollator to get around and a WC from MD when visiting offices. He has had decreased ROM, sensation, and strength in both BUE and BLE as well. Today he is mod A +2 for bed mobility and transfers with RW, he is max A for LB ADL, at least mod A for UB ADL. He requires significant assist for self-feeding. OT did provide built up handles to facilitate (please take red foam next time). Pt with significant decrease in independence and will benefit from skilled OT at the CIR level post-acute to maximize safety and independence in ADL and functional transfers. Next session to bring built up handles, and other AE for ADL. Pt is very motivated and has supportive family (2 adult sons)       Recommendations for follow up therapy are one component of a multi-disciplinary discharge planning process, led by the attending physician.  Recommendations may be updated based on patient status, additional functional criteria and insurance authorization.   Follow Up Recommendations  CIR (if denied CIR will require SNF)    Equipment Recommendations  3 in 1 bedside commode    Recommendations for Other Services Rehab consult      Precautions / Restrictions Precautions Precautions: Fall;Cervical Precaution Booklet Issued: Yes (comment) Required Braces or Orthoses: Cervical Brace Cervical Brace: Hard collar;At all times      Mobility Bed Mobility Overal bed mobility: Needs Assistance Bed Mobility: Rolling;Sidelying to Sit Rolling: Min assist Sidelying to sit: Mod assist;+2 for safety/equipment;HOB elevated       General bed mobility comments: assist for trunk elevation, BLE off the bed, and cues for sequencing    Transfers Overall transfer level: Needs assistance Equipment used: Rolling walker (2 wheeled) Transfers: Sit to/from [facility] Sit to Stand: Mod assist;+2 physical assistance;+2 safety/equipment;From elevated surface Stand pivot transfers: Mod assist;+2 safety/equipment       General transfer comment: vc for safe hand placement, mod A +2 to power into standing and for initial balance assist    Balance Overall balance assessment: Needs assistance;History of Falls Sitting-balance support: Bilateral upper extremity supported;Feet supported Sitting balance-Leahy Scale: Fair     Standing balance support: Bilateral upper extremity supported Standing balance-Leahy Scale: Poor Standing balance comment: dependent on BUE in standing for balance                           ADL either performed or assessed with clinical judgement   ADL Overall ADL's : Needs assistance/impaired Eating/Feeding: Minimal assistance;Sitting;With adaptive utensils Eating/Feeding Details (indicate cue type and reason): assist to open containers, apply condiments, decreased shoulder ROM and trouble bringing food to mouth due to decrease ROM and sensatoin. Pt provided with built up handles (washcloth taped around  utensil) which he reports helps. often Pt has to use BUE to bring food/drink to mouth Grooming: Wash/dry face;Moderate assistance;Sitting Grooming Details (indicate cue type and reason):  EOB, assist to support RUE at elbow for Pt to be able to reach face Upper Body Bathing: Moderate assistance   Lower Body Bathing: Maximal assistance   Upper Body Dressing : Maximal assistance;Sitting Upper Body Dressing Details (indicate cue type and reason): to change gown Lower Body Dressing: Maximal assistance   Toilet Transfer: Moderate assistance;+2 for physical assistance;+2 for safety/equipment;Ambulation;RW Toilet Transfer Details (indicate cue type and reason): mod A for initial boost into standing with vc for safe hand placement Toileting- Clothing Manipulation and Hygiene: Maximal assistance;Sit to/from stand;Min guard;Sitting/lateral lean Toileting - Clothing Manipulation Details (indicate cue type and reason): initially performing incomplete peri care leaning side to side (unsafely requiring assit) and then max A in standing for finishing peri care     Functional mobility during ADLs: Moderate assistance;+2 for physical assistance;+2 for safety/equipment;Rolling walker;Cueing for safety General ADL Comments: decreased functional use of BUE, decreased balance, decreased strength     Vision         Perception     Praxis      Pertinent Vitals/Pain Pain Assessment: No/denies pain Faces Pain Scale: Hurts little more Pain Location: BUE with purposeful movement Pain Descriptors / Indicators: Grimacing Pain Intervention(s): Limited activity within patient's tolerance;Monitored during session;Repositioned     Hand Dominance Right   Extremity/Trunk Assessment Upper Extremity Assessment Upper Extremity Assessment: RUE deficits/detail;LUE deficits/detail RUE Deficits / Details: decreased ROM, no shoulder shrug, decreased sensation and fine motor right is worse than left RUE Sensation: decreased light touch (improved since sx, but still present) RUE Coordination: decreased fine motor;decreased gross motor LUE Deficits / Details: decreased ROM, decreased shoulder shrug,  decreased sensation and fine motor left has better shoulder ROM than right LUE Sensation: decreased light touch (improved since sx, but still present) LUE Coordination: decreased fine motor;decreased gross motor   Lower Extremity Assessment Lower Extremity Assessment: Defer to PT evaluation   Cervical / Trunk Assessment Cervical / Trunk Assessment: Other exceptions Cervical / Trunk Exceptions: s/p cervical sx   Communication Communication Communication: Other (comment) (garbled/mumbled speech)   Cognition Arousal/Alertness: Awake/alert Behavior During Therapy: WFL for tasks assessed/performed Overall Cognitive Status: No family/caregiver present to determine baseline cognitive functioning Area of Impairment: Safety/judgement;Memory                     Memory: Decreased recall of precautions   Safety/Judgement: Decreased awareness of safety;Decreased awareness of deficits     General Comments: Pt is pleasant and motivated, difficulty retaining cervical precautions   General Comments  SpO2 reading low O2 - but Pt non-symptomatic breathing WFL, suspect it was because of Pt using finger where pulse ox was    Exercises     Shoulder Instructions      Home Living Family/patient expects to be discharged to:: Private residence Living Arrangements: Alone Available Help at Discharge: Family;Available PRN/intermittently Type of Home: Mobile home Home Access: Ramped entrance     Home Layout: One level     Bathroom Shower/Tub: Teacher, early years/pre: Standard Bathroom Accessibility: No   Home Equipment: Environmental consultant - 4 wheels   Additional Comments: Sons both work and have families      Prior Functioning/Environment Level of Independence: Needs assistance  Gait / Transfers Assistance Needed: ambulating with Rollator, and using WC from MD office for visits ADL's / [employer]  Needed: assist for IADL, sponge bathing, assist from Son as son is  able Communication / Swallowing Assistance Needed: garbled speech          OT Problem List: Decreased strength;Decreased range of motion;Decreased activity tolerance;Impaired balance (sitting and/or standing);Decreased safety awareness;Decreased knowledge of use of DME or AE;Decreased knowledge of precautions;Impaired sensation;Impaired UE functional use;Pain;Increased edema      OT Treatment/Interventions: Self-care/ADL training;Therapeutic exercise;Energy conservation;DME and/or AE instruction;Therapeutic activities;Patient/family education;Balance training    OT Goals(Current goals can be found in the care plan section) Acute Rehab OT Goals Patient Stated Goal: to get as independent at possible OT Goal Formulation: With patient Time For Goal Achievement: 01/16/21 Potential to Achieve Goals: Good ADL Goals Pt Will Perform Eating: with adaptive utensils;sitting;with modified independence Pt Will Perform Grooming: with supervision;sitting Pt Will Perform Upper Body Dressing: with supervision;sitting Pt Will Perform Lower Body Dressing: with supervision;sit to/from stand;with adaptive equipment Pt Will Transfer to Toilet: with supervision;ambulating Pt Will Perform Toileting - Clothing Manipulation and hygiene: with supervision;sitting/lateral leans Additional ADL Goal #1: Pt will verbalize cervical precautions, and application to ADL with no cues  OT Frequency: Min 2X/week   Barriers to D/C:            Co-evaluation PT/OT/SLP Co-Evaluation/Treatment: Yes Reason for Co-Treatment: For patient/therapist safety;To address functional/ADL transfers PT goals addressed during session: Mobility/safety with mobility;Balance;Proper use of DME;Strengthening/ROM OT goals addressed during session: ADL's and self-care;Strengthening/ROM;Proper use of Adaptive equipment and DME      AM-PAC OT "6 Clicks" Daily Activity     Outcome Measure Help from another person eating meals?: A Lot Help  from another person taking care of personal grooming?: A Lot Help from another person toileting, which includes using toliet, bedpan, or urinal?: A Lot Help from another person bathing (including washing, rinsing, drying)?: A Lot Help from another person to put on and taking off regular upper body clothing?: A Lot Help from another person to put on and taking off regular lower body clothing?: A Lot 6 Click Score: 12   End of Session Equipment Utilized During Treatment: Gait belt;Rolling walker Nurse Communication: Mobility status;Other (comment) (consider condom cath? typically uses depends at home)  Activity Tolerance: Patient tolerated treatment well Patient left: in chair;with call bell/phone within reach;with nursing/sitter in room  OT Visit Diagnosis: Unsteadiness on feet (R26.81);Other abnormalities of gait and mobility (R26.89);Muscle weakness (generalized) (M62.81);History of falling (Z91.81);Pain Pain - part of body: Arm;Leg                Time: [id number] OT Time Calculation (min): 31 min Charges:  OT General Charges $OT Visit: 1 Visit OT Evaluation $OT Eval Moderate Complexity: [place] OTR/L Acute Rehabilitation Services Pager: [phone] Office: [place] 01/02/2021, 10:00 AM

## 2021-01-02 NOTE — Evaluation (Signed)
Physical Therapy Evaluation Patient Details Name: Logan [name] MRN: [id number] DOB: [birth year] Today's Date: 01/02/2021  History of Present Illness  Logan [name] is a 75 y.o. y.o. admitted 01/01/21 male who initially presented to the outpatient clinic with signs and symptoms consistent with myelopathy. He had progressively complained of weakness, numbness and tingling in all 4 extremities over the past few years, but worsening over the past 6 months.  He was using four-wheel walker however this progressed to a wheelchair over the past few months Pt is now s/p anterior cervical discectomy and fusion at C3-4. PMH includes A. fib on Eliquis, heart failure, diabetes, hypertension, sleep apnea   Clinical Impression  Pt admitted for above. Pt with generalized weakness x 4 extremities, impaired fine motor co-ordination in bilat Hands, numbness in bilat feet and and hands, impaired balance, at high falls risk and is requiring modA for all mobility and ADLs. Pt very motivated to return to independence. Recommending CIR to achieve safe mod I level of function for safe transition home.       Recommendations for follow up therapy are one component of a multi-disciplinary discharge planning process, led by the attending physician.  Recommendations may be updated based on patient status, additional functional criteria and insurance authorization.  Follow Up Recommendations CIR    Equipment Recommendations   (TBD at next venue)    Recommendations for Other Services Rehab consult     Precautions / Restrictions Precautions Precautions: Fall;Cervical Precaution Booklet Issued: Yes (comment) Precaution Comments: pt with verbal understanding Required Braces or Orthoses: Cervical Brace Cervical Brace: Hard collar;At all times      Mobility  Bed Mobility Overal bed mobility: Needs Assistance Bed Mobility: Rolling;Sidelying to Sit Rolling: Min assist Sidelying to sit: Mod assist;+2 for safety/equipment;HOB  elevated       General bed mobility comments: assist for trunk elevation, BLE off the bed, and cues for sequencing    Transfers Overall transfer level: Needs assistance Equipment used: Rolling walker (2 wheeled) Transfers: Sit to/from [facility] Sit to Stand: Mod assist;+2 physical assistance;+2 safety/equipment;From elevated surface Stand pivot transfers: Mod assist;+2 safety/equipment       General transfer comment: vc for safe hand placement, mod A +2 to power into standing and for initial balance assist, berbal cues for hand placement, pt initially with posterior lean/retropulsion  Ambulation/Gait Ambulation/Gait assistance: Min assist;Mod assist;+2 safety/equipment Gait Distance (Feet): 30 Feet Assistive device: Rolling walker (2 wheeled) Gait Pattern/deviations: Step-through pattern;Decreased stride length;Narrow base of support Gait velocity: dec   General Gait Details: pt's R LE longer than L therefore pt amb with a limp, pt with decreased endurance, fatigued quickly, minA for management of RW during turns, pt did walker backwards about 2 feet to turn around at window in room  Stairs            Wheelchair Mobility    Modified Rankin (Stroke Patients Only)       Balance Overall balance assessment: Needs assistance;History of Falls Sitting-balance support: Bilateral upper extremity supported;Feet supported Sitting balance-Leahy Scale: Fair Sitting balance - Comments: pt unable to reach feet, min guard for balance, pt with significiant lean backwards to perform pericare   Standing balance support: Bilateral upper extremity supported Standing balance-Leahy Scale: Poor Standing balance comment: dependent on BUE in standing for balance                             Pertinent Vitals/Pain Pain Assessment: No/denies  pain Faces Pain Scale: Hurts little more Pain Location: BUE with purposeful movement Pain Descriptors / Indicators:  Grimacing Pain Intervention(s): Limited activity within patient's tolerance;Monitored during session;Repositioned    Home Living Family/patient expects to be discharged to:: Private residence Living Arrangements: Alone Available Help at Discharge: Family;Available PRN/intermittently Type of Home: Mobile home Home Access: Ramped entrance     Home Layout: One level Home Equipment: [place] - 4 wheels Additional Comments: Sons both work and have families    Prior Function Level of Independence: Needs assistance   Gait / Transfers Assistance Needed: ambulating with Rollator, and using WC from MD office for visits  ADL's / Homemaking Assistance Needed: assist for IADL, sponge bathing, assist from Son as son is able        Hand Dominance   Dominant Hand: Right    Extremity/Trunk Assessment   Upper Extremity Assessment Upper Extremity Assessment: Defer to OT evaluation RUE Deficits / Details: decreased ROM, no shoulder shrug, decreased sensation and fine motor right is worse than left RUE Sensation: decreased light touch (improved since sx, but still present) RUE Coordination: decreased fine motor;decreased gross motor LUE Deficits / Details: decreased ROM, decreased shoulder shrug, decreased sensation and fine motor left has better shoulder ROM than right LUE Sensation: decreased light touch (improved since sx, but still present) LUE Coordination: decreased fine motor;decreased gross motor    Lower Extremity Assessment Lower Extremity Assessment: Generalized weakness (pt's R LE longer than the R, grossly 4-/5 strength t/o, reports some numbness in bilat feet but not as bad as it was before surgery)    Cervical / Trunk Assessment Cervical / Trunk Assessment: Other exceptions Cervical / Trunk Exceptions: s/p cervical sx  Communication   Communication: Other (comment) (garbled/mumbled speech)  Cognition Arousal/Alertness: Awake/alert Behavior During Therapy: WFL for tasks  assessed/performed Overall Cognitive Status: No family/caregiver present to determine baseline cognitive functioning Area of Impairment: Safety/judgement                     Memory: Decreased recall of precautions   Safety/Judgement: Decreased awareness of safety;Decreased awareness of deficits     General Comments: pt pleasant and eager to get better and go home, pt follows commands appropriately and is not impulsive      General Comments General comments (skin integrity, edema, etc.): pt with bilat knee wounds from recent fall getting out of the car, SpO2 reading in 70s however pt not SOB and quesetionable reading as pt was squeezing hand on RW    Exercises     Assessment/Plan    PT Assessment Patient needs continued PT services  PT Problem List Decreased strength;Decreased range of motion;Decreased activity tolerance;Decreased balance;Decreased mobility;Decreased coordination;Decreased cognition;Decreased knowledge of use of DME;Decreased safety awareness       PT Treatment Interventions Gait training;DME instruction;Stair training;Functional mobility training;Balance training;Therapeutic exercise;Therapeutic activities;Neuromuscular re-education    PT Goals (Current goals can be found in the Care Plan section)  Acute Rehab PT Goals Patient Stated Goal: to get as independent at possible PT Goal Formulation: With patient Time For Goal Achievement: 01/16/21 Potential to Achieve Goals: Good    Frequency Min 5X/week   Barriers to discharge Decreased caregiver support lives alone, has 2 sons that check on him daily    Co-evaluation PT/OT/SLP Co-Evaluation/Treatment: Yes Reason for Co-Treatment: For patient/therapist safety PT goals addressed during session: Mobility/safety with mobility OT goals addressed during session: ADL's and self-care;Strengthening/ROM;Proper use of Adaptive equipment and DME       AM-PAC  PT "6 Clicks" Mobility  Outcome Measure Help  needed turning from your back to your side while in a flat bed without using bedrails?: A Lot Help needed moving from lying on your back to sitting on the side of a flat bed without using bedrails?: A Lot Help needed moving to and from a bed to a chair (including a wheelchair)?: A Lot Help needed standing up from a chair using your arms (e.g., wheelchair or bedside chair)?: A Lot Help needed to walk in hospital room?: A Little Help needed climbing 3-5 steps with a railing? : A Lot 6 Click Score: 13    End of Session Equipment Utilized During Treatment: Gait belt Activity Tolerance: Patient tolerated treatment well Patient left: in chair;with call bell/phone within reach;with nursing/sitter in room Nurse Communication: Mobility status PT Visit Diagnosis: Unsteadiness on feet (R26.81);Muscle weakness (generalized) (M62.81);History of falling (Z91.81);Difficulty in walking, not elsewhere classified (R26.2)    Time: [id number] PT Time Calculation (min) (ACUTE ONLY): 37 min   Charges:   PT Evaluation $PT Eval Moderate Complexity: 1 Mod          Kittie Plater, PT, DPT Acute Rehabilitation Services Pager #: [phone] Office #: [phone]   Berline Lopes 01/02/2021, 10:53 AM

## 2021-01-02 NOTE — Progress Notes (Signed)
Inpatient Rehab Admissions Coordinator:   Met with patient at the bedside and talked to his son, Kyvon, over the phone.  I explained CIR recommendations, goals of supervision level, and average length of stay to be about 2 weeks, dependent upon progress.  I do think given the extent of patients neurological deficits prior to this surgery and his improvement afterwards he is an excellent candidate.  I explained insurance authorization process to them both, and that there was a chance [employer] would not approve CIR level of care, but that I would do my best. I will start the auth process today and should have an initial response by tomorrow.   Shann Medal, PT, DPT Admissions Coordinator [phone] 01/02/21  2:50 PM

## 2021-01-02 NOTE — Progress Notes (Signed)
Inpatient Rehab Admissions Coordinator:   Consult received and note PT/OT recs for CIR.  Would not be able to get [facility] authorization if pt remains observation status.  I will meet with the patient today to discuss, and if status changes to inpatient I can potentially begin insurance authorization.   Shann Medal, PT, DPT Admissions Coordinator [phone] 01/02/21  11:41 AM

## 2021-01-02 NOTE — Anesthesia Postprocedure Evaluation (Signed)
Anesthesia Post Note  Patient: Logan [name]  Procedure(s) Performed: ACDF C3-4 (Neck)     Patient location during evaluation: PACU Anesthesia Type: General Level of consciousness: awake and alert Pain management: pain level controlled Vital Signs Assessment: post-procedure vital signs reviewed and stable Respiratory status: spontaneous breathing, nonlabored ventilation, respiratory function stable and patient connected to nasal cannula oxygen Cardiovascular status: blood pressure returned to baseline and stable Postop Assessment: no apparent nausea or vomiting Anesthetic complications: no   No notable events documented.  Last Vitals:  Vitals:   01/02/21 0800 01/02/21 1103  BP: 133/70 (!) 107/52  Pulse: 64 68  Resp: 18 20  Temp: 36.6 C   SpO2: 100% 100%    Last Pain:  Vitals:   01/02/21 1103  TempSrc: Oral  PainSc:                  [name]

## 2021-01-02 NOTE — PMR Pre-admission (Signed)
PMR Admission Coordinator Pre-Admission Assessment  Patient: Logan [name] is an 75 y.o., male MRN: [id number] DOB: [birth year] Height: 5' 7"  (170.2 cm) Weight: 83.5 kg  Insurance Information HMO:   yes   PPO:      PCP:      IPA:      80/20:      OTHER:  PRIMARY: [employer]      Policy#: [id number]      Subscriber:  CM Name: Lattie Haw      Phone#: [phone] ext [phone]     Fax#: [phone] Pre-Cert#: [id number] auth for CIR given by Lattie Haw at [facility] with updates due to fax listed above on 9/21.  F/U case manager is Jaclyn Prime, phone [phone] ext [phone]      Employer:  Benefits:  Phone #: [phone]     Name:  Eff. Date: 04/22/19     Deduct: $0      Out of Pocket Max: $3900 (met $1082.54)      Life Max:  CIR: $295/day for days 1-6      SNF: 20 full days Outpatient:      Co-Pay: $10-$20/visit Home Health: 100%      Co-Pay:  DME: 80%     Co-Ins: 20% Providers:  SECONDARY: Medicaid of [place]      Policy#: [id number]     Phone#:   Financial Counselor: Phone#:   The "Data Collection Information Summary" for patients in Inpatient Rehabilitation Facilities with attached "Privacy Act [place] Records" was provided and verbally reviewed with: Patient and Family  Emergency Contact Information Contact Information     Name Relation Home Work Bird-in-Hand Jr,Nirav Son [phone]         Current Medical History  Patient Admitting Diagnosis: cervical myelopathy  History of Present Illness: Pt is a 75 y/o male with a history of A-fib (on eliquis), heart failure, DM, HTN, and OSA who has had progressive cervical myelopathy with significant UE/LE weakness.  He has progressed from using a rollator to requiring a w/c in the community.  N/T in B hand and feet.  MRI revealed severe stenosis at C3-4 degenerative spondylolisthesis.  He presented to [facility] on 9/13 for surgical intervention for cervical myelopathy/radiculopathy.  Post operatively pt with ongoing UE>LE weakness.   Eliquis on hold with plan to restart on 9/23 per cardiology.  Post op course pain management.  Therapy evaluations were completed and pt was recommended for CIR.     Patient's medical record from [facility] has been reviewed by the rehabilitation admission coordinator and physician.  Past Medical History  Past Medical History:  Diagnosis Date   Anxiety    Atherosclerotic renal artery stenosis, unilateral ([place]) 02/28/2014   Atrial fibrillation (HCC)    Bilateral lower extremity edema    Chronic diastolic heart failure ([place]) 10/21/2019   Diabetes mellitus without complication (HCC)    DVT (deep venous thrombosis) (HCC)    Esophageal reflux    Essential hypertension 10/21/2019   Gout    Hyperlipidemia    Hypertension    Mixed hyperlipidemia 10/21/2019   Morbid obesity ([place])    Obesity (BMI 30-39.9) 08/24/2019   Orthopnea    Osteoarthritis    Persistent atrial fibrillation ([place]) 10/21/2019   Seasonal allergies    Sleep apnea     Has the patient had major surgery during 100 days prior to admission? Yes  Family History   family history includes Atrial fibrillation in his brother; Cancer in his father; Heart  disease in his brother and mother.  Current Medications  Current Facility-Administered Medications:    0.9 %  sodium chloride infusion, 250 mL, Intravenous, Continuous, Dawley, Troy C, DO   acetaminophen (TYLENOL) tablet 650 mg, 650 mg, Oral, Q4H PRN **OR** acetaminophen (TYLENOL) suppository 650 mg, 650 mg, Rectal, Q4H PRN, Dawley, Troy C, DO   alum & mag hydroxide-simeth (MAALOX/MYLANTA) 200-200-20 MG/5ML suspension 30 mL, 30 mL, Oral, Q6H PRN, Dawley, Troy C, DO   bacitracin ointment, , Topical, BID, Dawley, Troy C, DO, Given at 01/03/21 0843   carvedilol (COREG) tablet 12.5 mg, 12.5 mg, Oral, BID WC, Dawley, Troy C, DO, 12.5 mg at 01/03/21 0843   fluticasone (FLONASE) 50 MCG/ACT nasal spray 1 spray, 1 spray, Each Nare, Daily PRN, Dawley, Troy C, DO   furosemide (LASIX) tablet 40  mg, 40 mg, Oral, Daily, Dawley, Troy C, DO, 40 mg at 01/03/21 0844   heparin injection 5,000 Units, 5,000 Units, Subcutaneous, Q12H, Dawley, Troy C, DO, 5,000 Units at 01/03/21 0844   HYDROmorphone (DILAUDID) injection 0.5 mg, 0.5 mg, Intravenous, Q3H PRN, Dawley, Troy C, DO   influenza vaccine adjuvanted (FLUAD) injection 0.5 mL, 0.5 mL, Intramuscular, Tomorrow-1000, Dawley, Troy C, DO   lactated ringers infusion, , Intravenous, Continuous, Dawley, Troy C, DO, Stopped at 01/01/21 2340   loratadine (CLARITIN) tablet 10 mg, 10 mg, Oral, Daily PRN, Dawley, Troy C, DO   losartan (COZAAR) tablet 100 mg, 100 mg, Oral, Daily, Dawley, Troy C, DO, 100 mg at 01/03/21 0846   menthol-cetylpyridinium (CEPACOL) lozenge 3 mg, 1 lozenge, Oral, PRN **OR** phenol (CHLORASEPTIC) mouth spray 1 spray, 1 spray, Mouth/Throat, PRN, Dawley, Troy C, DO   ondansetron (ZOFRAN) tablet 4 mg, 4 mg, Oral, Q6H PRN **OR** ondansetron (ZOFRAN) injection 4 mg, 4 mg, Intravenous, Q6H PRN, Dawley, Troy C, DO   pantoprazole (PROTONIX) EC tablet 40 mg, 40 mg, Oral, Daily, Dawley, Troy C, DO, 40 mg at 01/03/21 0843   potassium chloride (KLOR-CON) CR tablet 10 mEq, 10 mEq, Oral, Daily, Dawley, Troy C, DO, 10 mEq at 01/03/21 0843   senna-docusate (Senokot-S) tablet 1 tablet, 1 tablet, Oral, QHS PRN, Dawley, Troy C, DO   sodium chloride flush (NS) 0.9 % injection 3 mL, 3 mL, Intravenous, Q12H, Dawley, Troy C, DO, 3 mL at 01/03/21 0844   sodium chloride flush (NS) 0.9 % injection 3 mL, 3 mL, Intravenous, PRN, Dawley, Troy C, DO  Patients Current Diet:  Diet Order             Diet regular Room service appropriate? Yes with Assist; Fluid consistency: Thin  Diet effective now                   Precautions / Restrictions Precautions Precautions: Fall, Cervical Precaution Booklet Issued: Yes (comment) Precaution Comments: pt with verbal understanding Cervical Brace: Hard collar, At all times   Has the patient had 2 or more falls  or a fall with injury in the past year? Yes  Prior Activity Level Household: mostly household ambulation with rollator, using a w/c for the last month or so to get to appointments, states he was driving until about 3 weeks ago.  Prior Functional Level Self Care: Did the patient need help bathing, dressing, using the toilet or eating? Independent  Indoor Mobility: Did the patient need assistance with walking from room to room (with or without device)? Independent  Stairs: Did the patient need assistance with internal or external stairs (with or without device)? Dependent  Functional Cognition: Did the patient need help planning regular tasks such as shopping or remembering to take medications? Independent  Patient Information Are you of Hispanic, Latino/a,or Spanish origin?: A. No, not of Hispanic, Latino/a, or Spanish origin What is your race?: A. White Do you need or want an interpreter to communicate with a doctor or health care staff?: 0. No  Patient's Response To:  Health Literacy and Transportation Is the patient able to respond to health literacy and transportation needs?: Yes Health Literacy - How often do you need to have someone help you when you read instructions, pamphlets, or other written material from your doctor or pharmacy?: Never In the past 12 months, has lack of transportation kept you from medical appointments or from getting medications?: No In the past 12 months, has lack of transportation kept you from meetings, work, or from getting things needed for daily living?: No  Home Assistive Devices / [place] Devices/Equipment: Environmental consultant (specify type), Eyeglasses Home Equipment: Walker - 4 wheels  Prior Device Use: Indicate devices/aids used by the patient prior to current illness, exacerbation or injury? Manual wheelchair and Walker  Current Functional Level Cognition  Overall Cognitive Status: No family/caregiver present to determine baseline  cognitive functioning Orientation Level: Oriented X4 Safety/Judgement: Decreased awareness of safety, Decreased awareness of deficits General Comments: pt pleasant and eager to get better and go home, pt follows commands appropriately and is not impulsive    Extremity Assessment (includes Sensation/Coordination)  Upper Extremity Assessment: Defer to OT evaluation RUE Deficits / Details: decreased ROM, no shoulder shrug, decreased sensation and fine motor right is worse than left RUE Sensation: decreased light touch (improved since sx, but still present) RUE Coordination: decreased fine motor, decreased gross motor LUE Deficits / Details: decreased ROM, decreased shoulder shrug, decreased sensation and fine motor left has better shoulder ROM than right LUE Sensation: decreased light touch (improved since sx, but still present) LUE Coordination: decreased fine motor, decreased gross motor  Lower Extremity Assessment: Generalized weakness (pt's R LE longer than the R, grossly 4-/5 strength t/o, reports some numbness in bilat feet but not as bad as it was before surgery)    ADLs  Overall ADL's : Needs assistance/impaired Eating/Feeding: Minimal assistance, Sitting, With adaptive utensils Eating/Feeding Details (indicate cue type and reason): assist to open containers, apply condiments, decreased shoulder ROM and trouble bringing food to mouth due to decrease ROM and sensatoin. Pt provided with built up handles (washcloth taped around utensil) which he reports helps. often Pt has to use BUE to bring food/drink to mouth Grooming: Wash/dry face, Moderate assistance, Sitting Grooming Details (indicate cue type and reason): EOB, assist to support RUE at elbow for Pt to be able to reach face Upper Body Bathing: Moderate assistance Lower Body Bathing: Maximal assistance Upper Body Dressing : Maximal assistance, Sitting Upper Body Dressing Details (indicate cue type and reason): to change gown Lower  Body Dressing: Maximal assistance Toilet Transfer: Moderate assistance, +2 for physical assistance, +2 for safety/equipment, Ambulation, RW Toilet Transfer Details (indicate cue type and reason): mod A for initial boost into standing with vc for safe hand placement Toileting- Clothing Manipulation and Hygiene: Maximal assistance, Sit to/from stand, Min guard, Sitting/lateral lean Toileting - Clothing Manipulation Details (indicate cue type and reason): initially performing incomplete peri care leaning side to side (unsafely requiring assit) and then max A in standing for finishing peri care Functional mobility during ADLs: Moderate assistance, +2 for physical assistance, +2 for safety/equipment, Rolling walker, Cueing for  safety General ADL Comments: decreased functional use of BUE, decreased balance, decreased strength    Mobility  Overal bed mobility: Needs Assistance Bed Mobility: Rolling, Sidelying to Sit Rolling: Min assist Sidelying to sit: Mod assist, +2 for safety/equipment, HOB elevated General bed mobility comments: assist for trunk elevation, BLE off the bed, and cues for sequencing    Transfers  Overall transfer level: Needs assistance Equipment used: Rolling walker (2 wheeled) Transfers: Sit to/from Stand, [employer] Transfers Sit to Stand: Mod assist, +2 physical assistance, +2 safety/equipment, From elevated surface Stand pivot transfers: Mod assist, +2 safety/equipment General transfer comment: vc for safe hand placement, mod A +2 to power into standing and for initial balance assist, berbal cues for hand placement, pt initially with posterior lean/retropulsion    Ambulation / Gait / Stairs / Wheelchair Mobility  Ambulation/Gait Ambulation/Gait assistance: Min assist, Mod assist, +2 safety/equipment Gait Distance (Feet): 30 Feet Assistive device: Rolling walker (2 wheeled) Gait Pattern/deviations: Step-through pattern, Decreased stride length, Narrow base of  support General Gait Details: pt's R LE longer than L therefore pt amb with a limp, pt with decreased endurance, fatigued quickly, minA for management of RW during turns, pt did walker backwards about 2 feet to turn around at window in room Gait velocity: dec    Posture / Balance Dynamic Sitting Balance Sitting balance - Comments: pt unable to reach feet, min guard for balance, pt with significiant lean backwards to perform pericare Balance Overall balance assessment: Needs assistance, History of Falls Sitting-balance support: Bilateral upper extremity supported, Feet supported Sitting balance-Leahy Scale: Fair Sitting balance - Comments: pt unable to reach feet, min guard for balance, pt with significiant lean backwards to perform pericare Standing balance support: Bilateral upper extremity supported Standing balance-Leahy Scale: Poor Standing balance comment: dependent on BUE in standing for balance    Special needs/care consideration Skin surgical incision to anterior neck   Previous Home Environment (from acute therapy documentation) Living Arrangements: Alone Available Help at Discharge: Family, Available PRN/intermittently Type of Home: Mobile home Home Layout: One level Home Access: Ramped entrance Bathroom Shower/Tub: Chiropodist: Standard Bathroom Accessibility: No Home Care Services: No Additional Comments: Sons both work and have families  Discharge Living Setting Plans for Discharge Living Setting: Patient's home Type of Home at Discharge: Mobile home Discharge Home Layout: One level Discharge Home Access: Ramped entrance Discharge Bathroom Shower/Tub: Tub/shower unit Discharge Bathroom Toilet: Standard Discharge Bathroom Accessibility: No Does the patient have any problems obtaining your medications?: No  Social/Family/Support Systems Anticipated Caregiver: son, Tyriek Hofman Anticipated Caregiver's Contact Information:  [phone] Ability/Limitations of Caregiver: supervision Caregiver Availability: 24/7 Discharge Plan Discussed with Primary Caregiver: Yes Is Caregiver In Agreement with Plan?: Yes Does Caregiver/Family have Issues with Lodging/Transportation while Pt is in Rehab?: No  Goals Patient/Family Goal for Rehab: PT/OT supervision, SLP n/a Expected length of stay: 14-16 days Additional Information: discussed need for initial 24/7 with pt's son, Haniel Pt/Family Agrees to Admission and willing to participate: Yes Program Orientation Provided & Reviewed with Pt/Caregiver Including Roles  & Responsibilities: Yes  Barriers to Discharge: Insurance for SNF coverage  Decrease burden of Care through IP rehab admission: n/a  Possible need for SNF placement upon discharge: Not anticipated.  Patient Condition: I have reviewed medical records from [facility], spoken with CM, and patient and son. I met with patient at the bedside and discussed via phone for inpatient rehabilitation assessment.  Patient will benefit from ongoing PT and OT, can actively participate  in 3 hours of therapy a day 5 days of the week, and can make measurable gains during the admission.  Patient will also benefit from the coordinated team approach during an Inpatient Acute Rehabilitation admission.  The patient will receive intensive therapy as well as Rehabilitation physician, nursing, social worker, and care management interventions.  Due to bladder management, bowel management, safety, skin/wound care, disease management, medication administration, pain management, and patient education the patient requires 24 hour a day rehabilitation nursing.  The patient is currently min to mod assist with mobility and basic ADLs.  Discharge setting and therapy post discharge at home with home health is anticipated.  Patient has agreed to participate in the Acute Inpatient Rehabilitation Program and will admit today.  Preadmission Screen  Completed By:  Michel Santee, PT, DPT 01/03/2021 11:24 AM ______________________________________________________________________   Discussed status with Dr. Ranell Patrick on 01/03/21  at 11:25 AM  and received approval for admission today.  Admission Coordinator:  Michel Santee, PT, DPT 11:25 AM Sudie Grumbling 01/03/21    Assessment/Plan: Diagnosis: Cervical myelopathy Does the need for close, 24 hr/day Medical supervision in concert with the patient's rehab needs make it unreasonable for this patient to be served in a less intensive setting? Yes Co-Morbidities requiring supervision/potential complications: unilateral renal artery stenosis, sleep apnea, OA, HTN, HLD, gout Due to bladder management, bowel management, safety, skin/wound care, disease management, medication administration, pain management, and patient education, does the patient require 24 hr/day rehab nursing? Yes Does the patient require coordinated care of a physician, rehab nurse, PT, OT to address physical and functional deficits in the context of the above medical diagnosis(es)? Yes Addressing deficits in the following areas: balance, endurance, locomotion, strength, transferring, bowel/bladder control, bathing, dressing, feeding, grooming, toileting, and psychosocial support Can the patient actively participate in an intensive therapy program of at least 3 hrs of therapy 5 days a week? Yes The potential for patient to make measurable gains while on inpatient rehab is excellent Anticipated functional outcomes upon discharge from inpatient rehab: modified independent PT, modified independent OT, independent SLP Estimated rehab length of stay to reach the above functional goals is: 10-14 days Anticipated discharge destination: Home 10. Overall Rehab/Functional Prognosis: excellent   MD Signature: Leeroy Cha, MD

## 2021-01-03 ENCOUNTER — Encounter (HOSPITAL_COMMUNITY): Payer: Self-pay | Admitting: Physical Medicine and Rehabilitation

## 2021-01-03 ENCOUNTER — Inpatient Hospital Stay (HOSPITAL_COMMUNITY)
Admission: RE | Admit: 2021-01-03 | Discharge: 2021-01-30 | DRG: 559 | Disposition: A | Discharge status: Home Health | Payer: Medicare HMO | Source: Intra-hospital | Attending: Physical Medicine and Rehabilitation | Admitting: Physical Medicine and Rehabilitation

## 2021-01-03 ENCOUNTER — Other Ambulatory Visit: Payer: Self-pay

## 2021-01-03 ENCOUNTER — Inpatient Hospital Stay (HOSPITAL_COMMUNITY): Payer: Medicare HMO

## 2021-01-03 DIAGNOSIS — M1990 Unspecified osteoarthritis, unspecified site: Secondary | ICD-10-CM | POA: Diagnosis not present

## 2021-01-03 DIAGNOSIS — G8250 Quadriplegia, unspecified: Secondary | ICD-10-CM | POA: Diagnosis present

## 2021-01-03 DIAGNOSIS — M25511 Pain in right shoulder: Secondary | ICD-10-CM | POA: Diagnosis not present

## 2021-01-03 DIAGNOSIS — K219 Gastro-esophageal reflux disease without esophagitis: Secondary | ICD-10-CM | POA: Diagnosis present

## 2021-01-03 DIAGNOSIS — Z808 Family history of malignant neoplasm of other organs or systems: Secondary | ICD-10-CM | POA: Diagnosis not present

## 2021-01-03 DIAGNOSIS — G4733 Obstructive sleep apnea (adult) (pediatric): Secondary | ICD-10-CM | POA: Diagnosis present

## 2021-01-03 DIAGNOSIS — E782 Mixed hyperlipidemia: Secondary | ICD-10-CM | POA: Diagnosis present

## 2021-01-03 DIAGNOSIS — X58XXXA Exposure to other specified factors, initial encounter: Secondary | ICD-10-CM | POA: Diagnosis present

## 2021-01-03 DIAGNOSIS — F419 Anxiety disorder, unspecified: Secondary | ICD-10-CM | POA: Diagnosis present

## 2021-01-03 DIAGNOSIS — Z79899 Other long term (current) drug therapy: Secondary | ICD-10-CM | POA: Diagnosis not present

## 2021-01-03 DIAGNOSIS — Z7901 Long term (current) use of anticoagulants: Secondary | ICD-10-CM | POA: Diagnosis not present

## 2021-01-03 DIAGNOSIS — Z9981 Dependence on supplemental oxygen: Secondary | ICD-10-CM

## 2021-01-03 DIAGNOSIS — M5000 Cervical disc disorder with myelopathy, unspecified cervical region: Secondary | ICD-10-CM

## 2021-01-03 DIAGNOSIS — R0902 Hypoxemia: Secondary | ICD-10-CM | POA: Diagnosis not present

## 2021-01-03 DIAGNOSIS — M109 Gout, unspecified: Secondary | ICD-10-CM | POA: Diagnosis present

## 2021-01-03 DIAGNOSIS — M75121 Complete rotator cuff tear or rupture of right shoulder, not specified as traumatic: Secondary | ICD-10-CM | POA: Diagnosis not present

## 2021-01-03 DIAGNOSIS — G959 Disease of spinal cord, unspecified: Secondary | ICD-10-CM | POA: Diagnosis not present

## 2021-01-03 DIAGNOSIS — Z86718 Personal history of other venous thrombosis and embolism: Secondary | ICD-10-CM | POA: Diagnosis not present

## 2021-01-03 DIAGNOSIS — I110 Hypertensive heart disease with heart failure: Secondary | ICD-10-CM | POA: Diagnosis not present

## 2021-01-03 DIAGNOSIS — M75101 Unspecified rotator cuff tear or rupture of right shoulder, not specified as traumatic: Secondary | ICD-10-CM | POA: Diagnosis present

## 2021-01-03 DIAGNOSIS — S43004A Unspecified dislocation of right shoulder joint, initial encounter: Secondary | ICD-10-CM | POA: Diagnosis not present

## 2021-01-03 DIAGNOSIS — S80212D Abrasion, left knee, subsequent encounter: Secondary | ICD-10-CM

## 2021-01-03 DIAGNOSIS — I482 Chronic atrial fibrillation, unspecified: Secondary | ICD-10-CM | POA: Diagnosis not present

## 2021-01-03 DIAGNOSIS — E876 Hypokalemia: Secondary | ICD-10-CM | POA: Diagnosis not present

## 2021-01-03 DIAGNOSIS — D179 Benign lipomatous neoplasm, unspecified: Secondary | ICD-10-CM | POA: Diagnosis not present

## 2021-01-03 DIAGNOSIS — E663 Overweight: Secondary | ICD-10-CM | POA: Diagnosis present

## 2021-01-03 DIAGNOSIS — E8809 Other disorders of plasma-protein metabolism, not elsewhere classified: Secondary | ICD-10-CM | POA: Diagnosis present

## 2021-01-03 DIAGNOSIS — E119 Type 2 diabetes mellitus without complications: Secondary | ICD-10-CM | POA: Diagnosis present

## 2021-01-03 DIAGNOSIS — E860 Dehydration: Secondary | ICD-10-CM | POA: Diagnosis present

## 2021-01-03 DIAGNOSIS — M1612 Unilateral primary osteoarthritis, left hip: Secondary | ICD-10-CM

## 2021-01-03 DIAGNOSIS — K5900 Constipation, unspecified: Secondary | ICD-10-CM | POA: Diagnosis present

## 2021-01-03 DIAGNOSIS — Z4789 Encounter for other orthopedic aftercare: Secondary | ICD-10-CM | POA: Diagnosis not present

## 2021-01-03 DIAGNOSIS — D539 Nutritional anemia, unspecified: Secondary | ICD-10-CM

## 2021-01-03 DIAGNOSIS — I10 Essential (primary) hypertension: Secondary | ICD-10-CM | POA: Diagnosis present

## 2021-01-03 DIAGNOSIS — S43001A Unspecified subluxation of right shoulder joint, initial encounter: Secondary | ICD-10-CM | POA: Diagnosis not present

## 2021-01-03 DIAGNOSIS — E46 Unspecified protein-calorie malnutrition: Secondary | ICD-10-CM

## 2021-01-03 DIAGNOSIS — Z8249 Family history of ischemic heart disease and other diseases of the circulatory system: Secondary | ICD-10-CM | POA: Diagnosis not present

## 2021-01-03 DIAGNOSIS — M21921 Unspecified acquired deformity of right upper arm: Secondary | ICD-10-CM

## 2021-01-03 DIAGNOSIS — Z993 Dependence on wheelchair: Secondary | ICD-10-CM

## 2021-01-03 DIAGNOSIS — I4819 Other persistent atrial fibrillation: Secondary | ICD-10-CM | POA: Diagnosis present

## 2021-01-03 DIAGNOSIS — M169 Osteoarthritis of hip, unspecified: Secondary | ICD-10-CM

## 2021-01-03 DIAGNOSIS — R152 Fecal urgency: Secondary | ICD-10-CM | POA: Diagnosis not present

## 2021-01-03 DIAGNOSIS — R7303 Prediabetes: Secondary | ICD-10-CM

## 2021-01-03 DIAGNOSIS — G8929 Other chronic pain: Secondary | ICD-10-CM | POA: Diagnosis present

## 2021-01-03 DIAGNOSIS — Z87891 Personal history of nicotine dependence: Secondary | ICD-10-CM | POA: Diagnosis not present

## 2021-01-03 DIAGNOSIS — I5032 Chronic diastolic (congestive) heart failure: Secondary | ICD-10-CM | POA: Diagnosis not present

## 2021-01-03 DIAGNOSIS — Z6828 Body mass index (BMI) 28.0-28.9, adult: Secondary | ICD-10-CM

## 2021-01-03 DIAGNOSIS — Z01818 Encounter for other preprocedural examination: Secondary | ICD-10-CM | POA: Diagnosis not present

## 2021-01-03 DIAGNOSIS — S80211D Abrasion, right knee, subsequent encounter: Secondary | ICD-10-CM

## 2021-01-03 HISTORY — DX: Cervical disc disorder with myelopathy, unspecified cervical region: M50.00

## 2021-01-03 LAB — GLUCOSE, CAPILLARY
Glucose-Capillary: 144 mg/dL — ABNORMAL HIGH (ref 70–99)
Glucose-Capillary: 98 mg/dL (ref 70–99)

## 2021-01-03 IMAGING — CR DG CHEST 2V
2 series · 2 of 2 positions shown · non-contrast
Comparison: [DATE]

CLINICAL DATA: Persistent cough

EXAM:
CHEST - 2 VIEW

[chest lat]
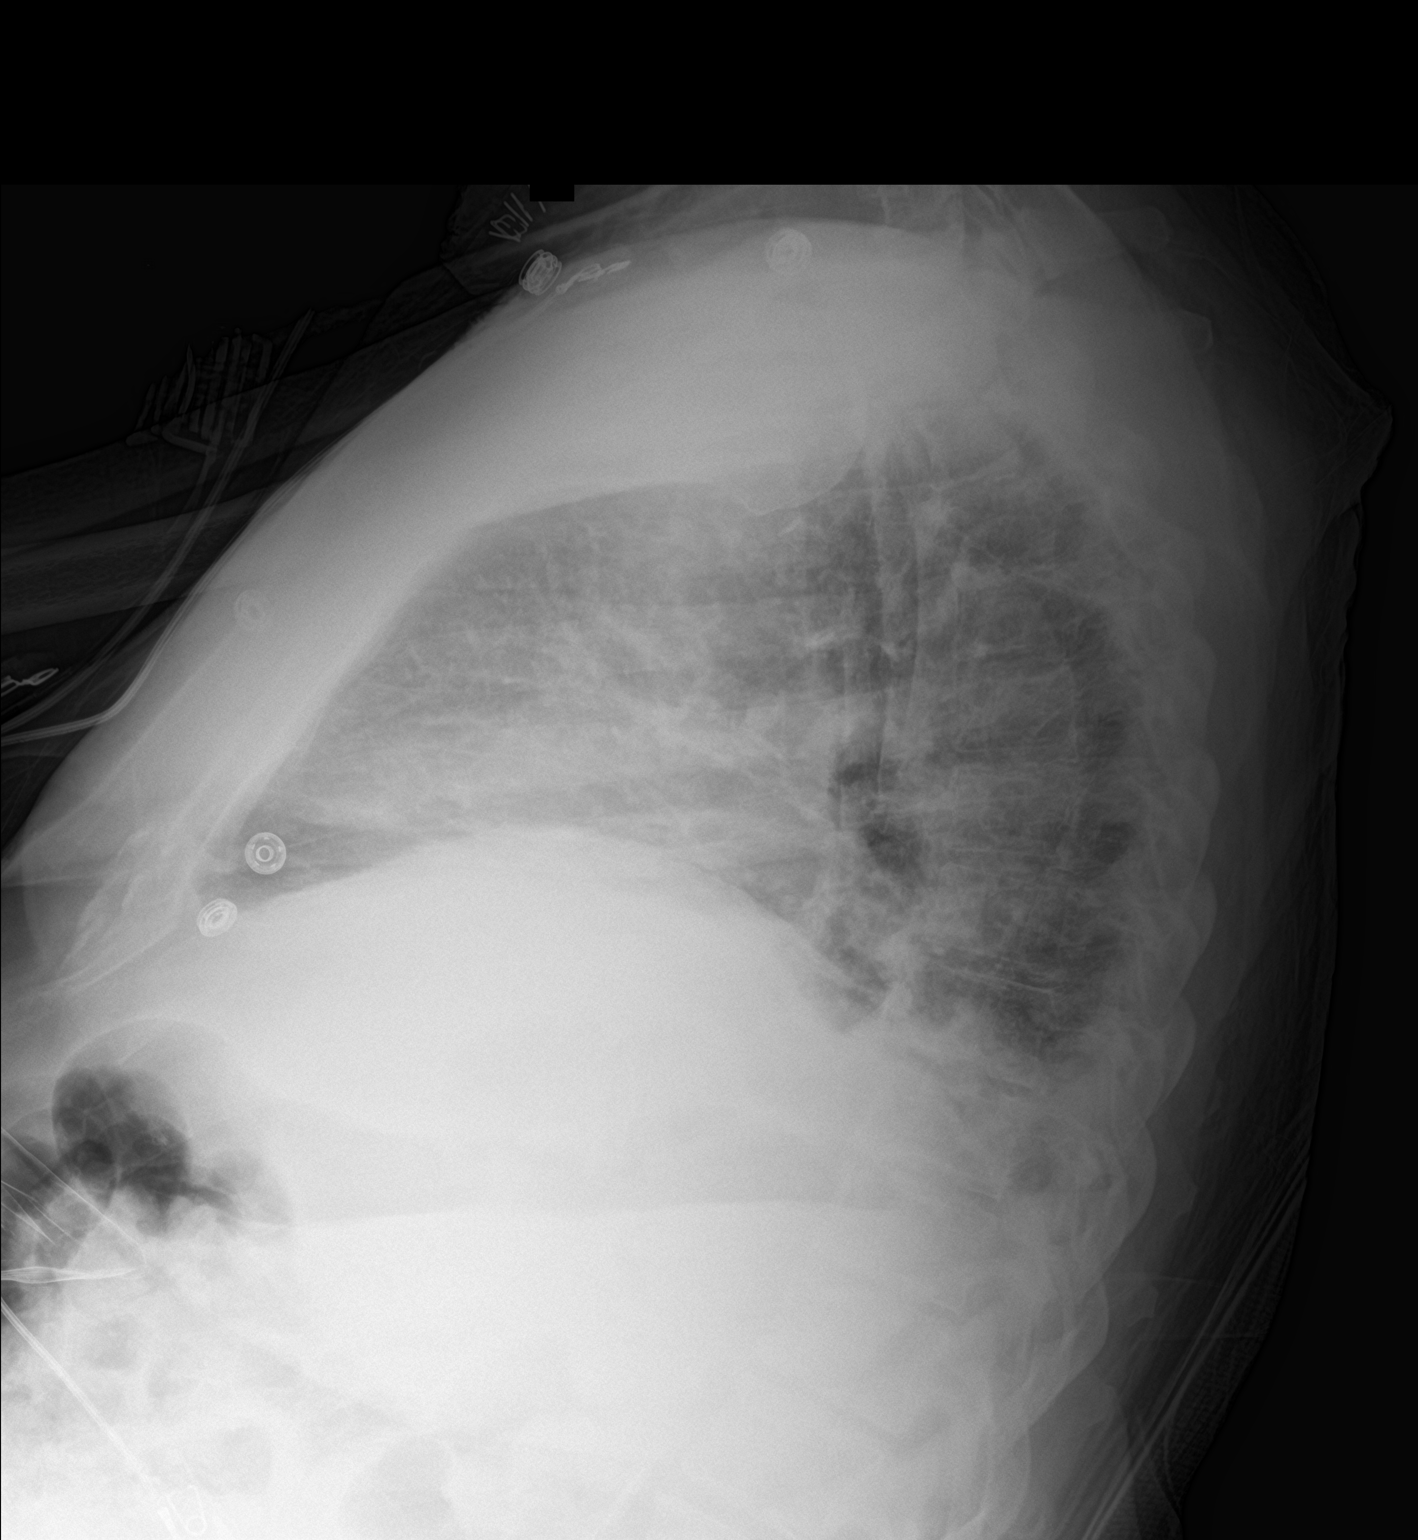

[chest ap]
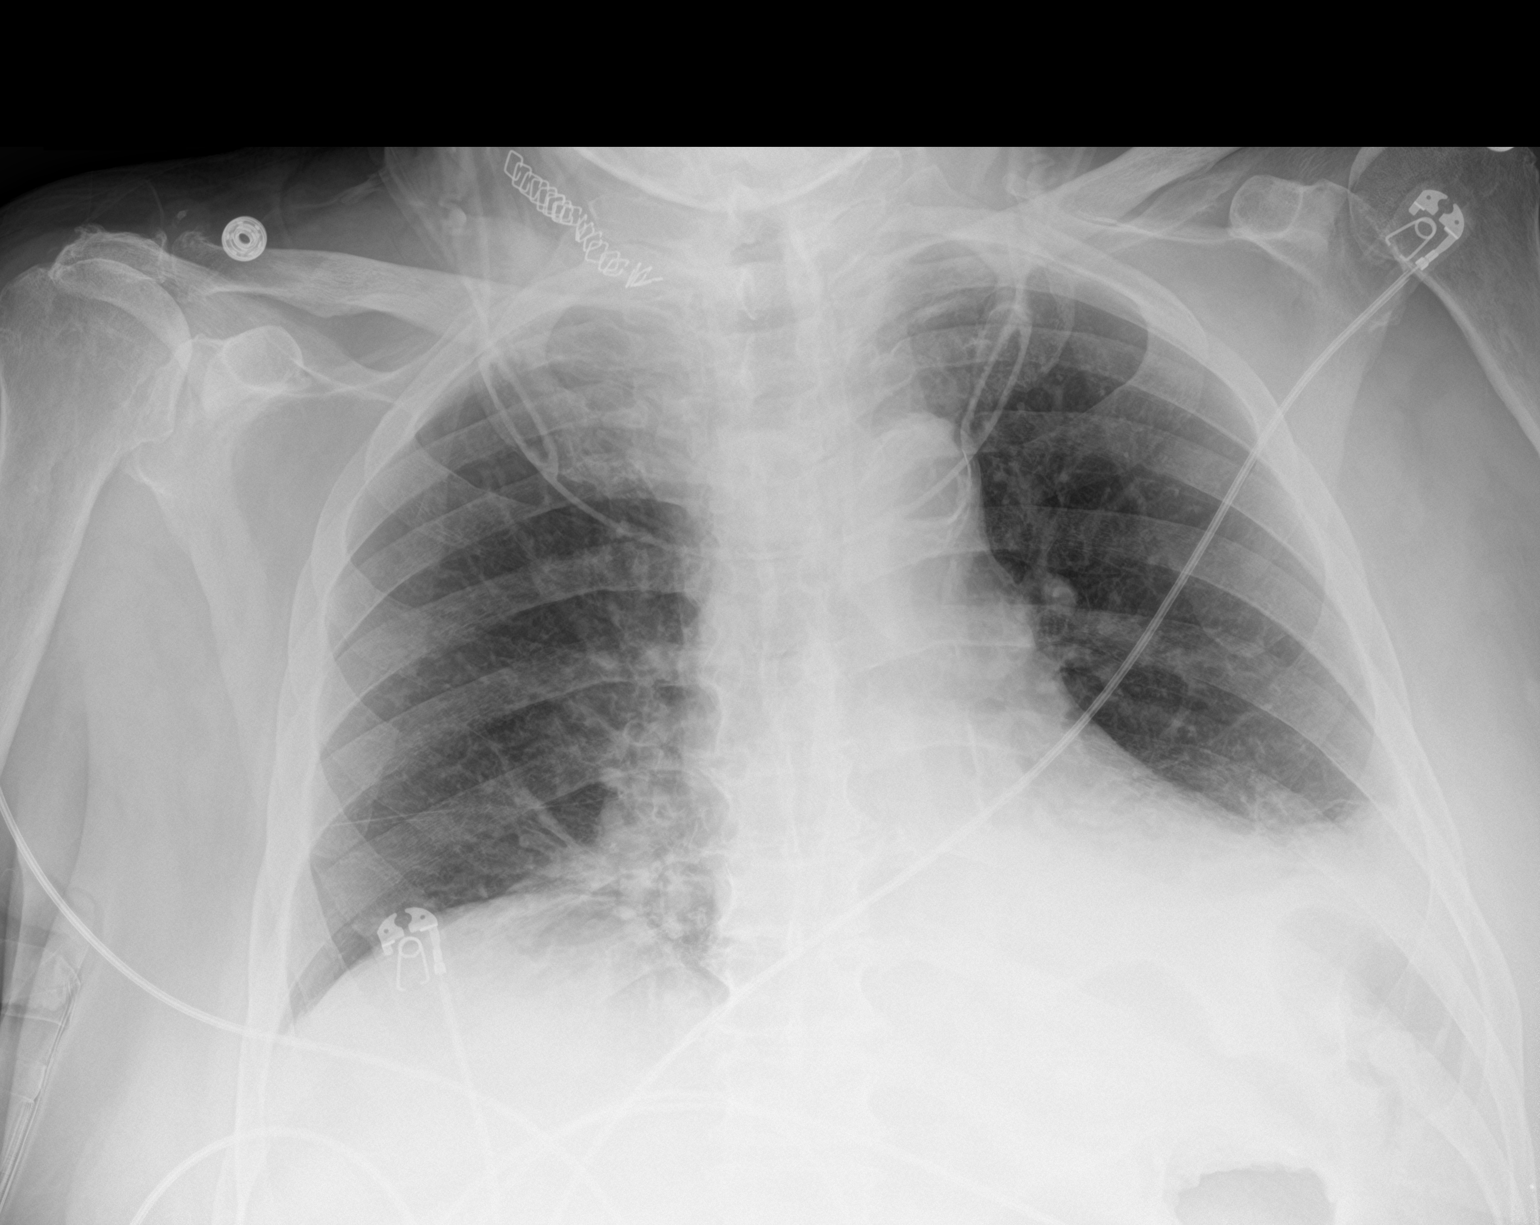

[2 of 2 positions shown; findings below may reference images not displayed]

FINDINGS: Single frontal view of the chest demonstrates an unremarkable
cardiac silhouette. Consolidation at the left lung base could
reflect airspace disease or atelectasis. No effusion or
pneumothorax. No acute bony abnormalities. Postsurgical changes are
seen in the right supraclavicular region.
IMPRESSION: 1. Left basilar consolidation, favor atelectasis given recent
cervical spine surgery.

## 2021-01-03 IMAGING — CR DG HIP (WITH OR WITHOUT PELVIS) 2-3V*L*
3 series · 3 of 3 positions shown · non-contrast
Comparison: [DATE]

CLINICAL DATA: Left hip pain without trauma

EXAM:
DG HIP (WITH OR WITHOUT PELVIS) 2-3V LEFT

[pelvis ap]
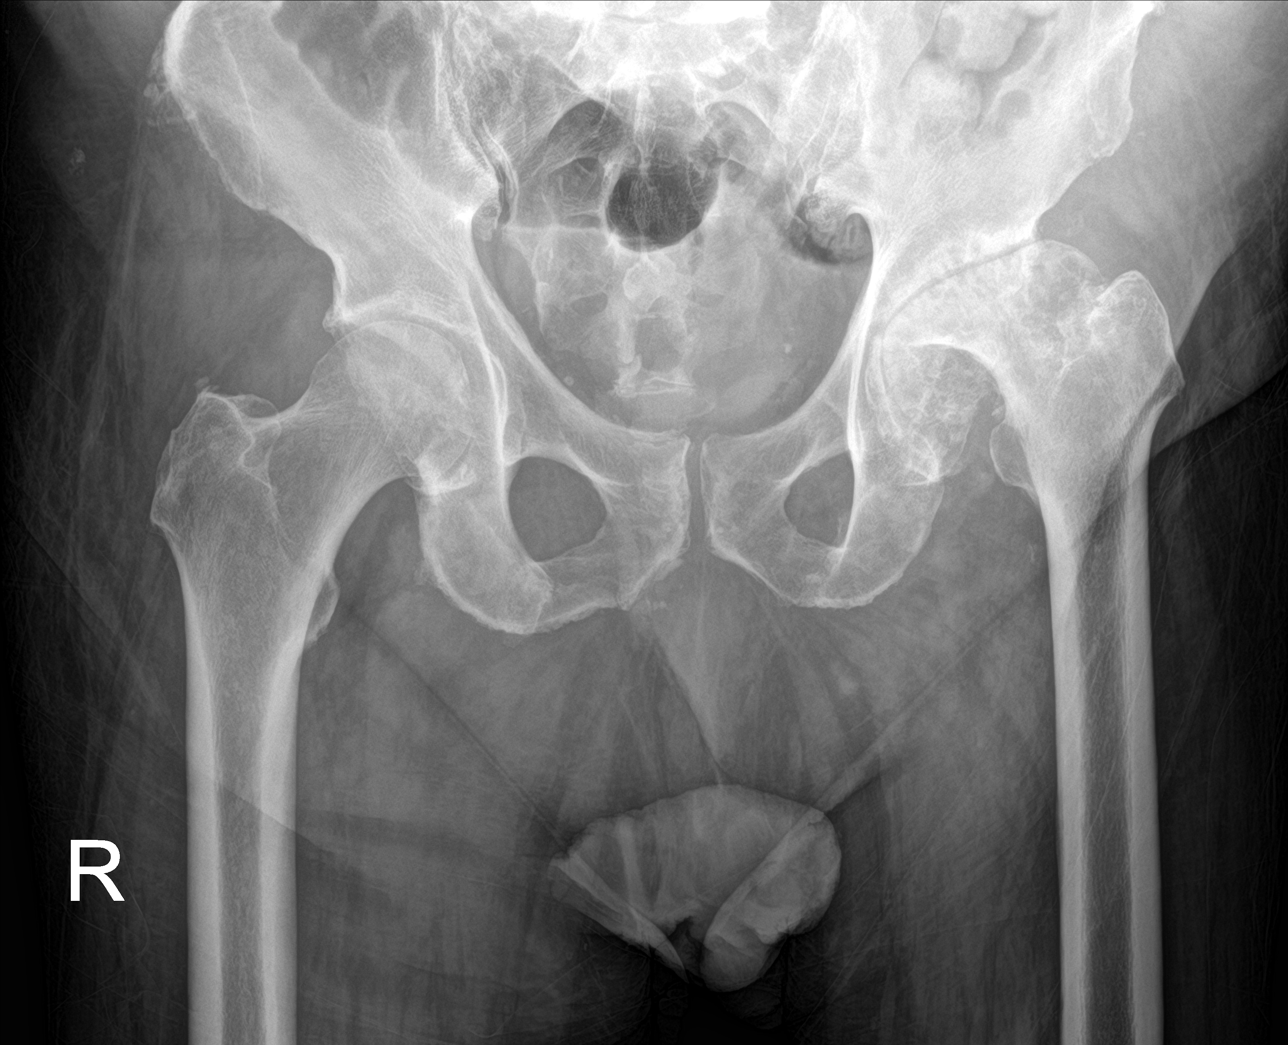

[hip ap]
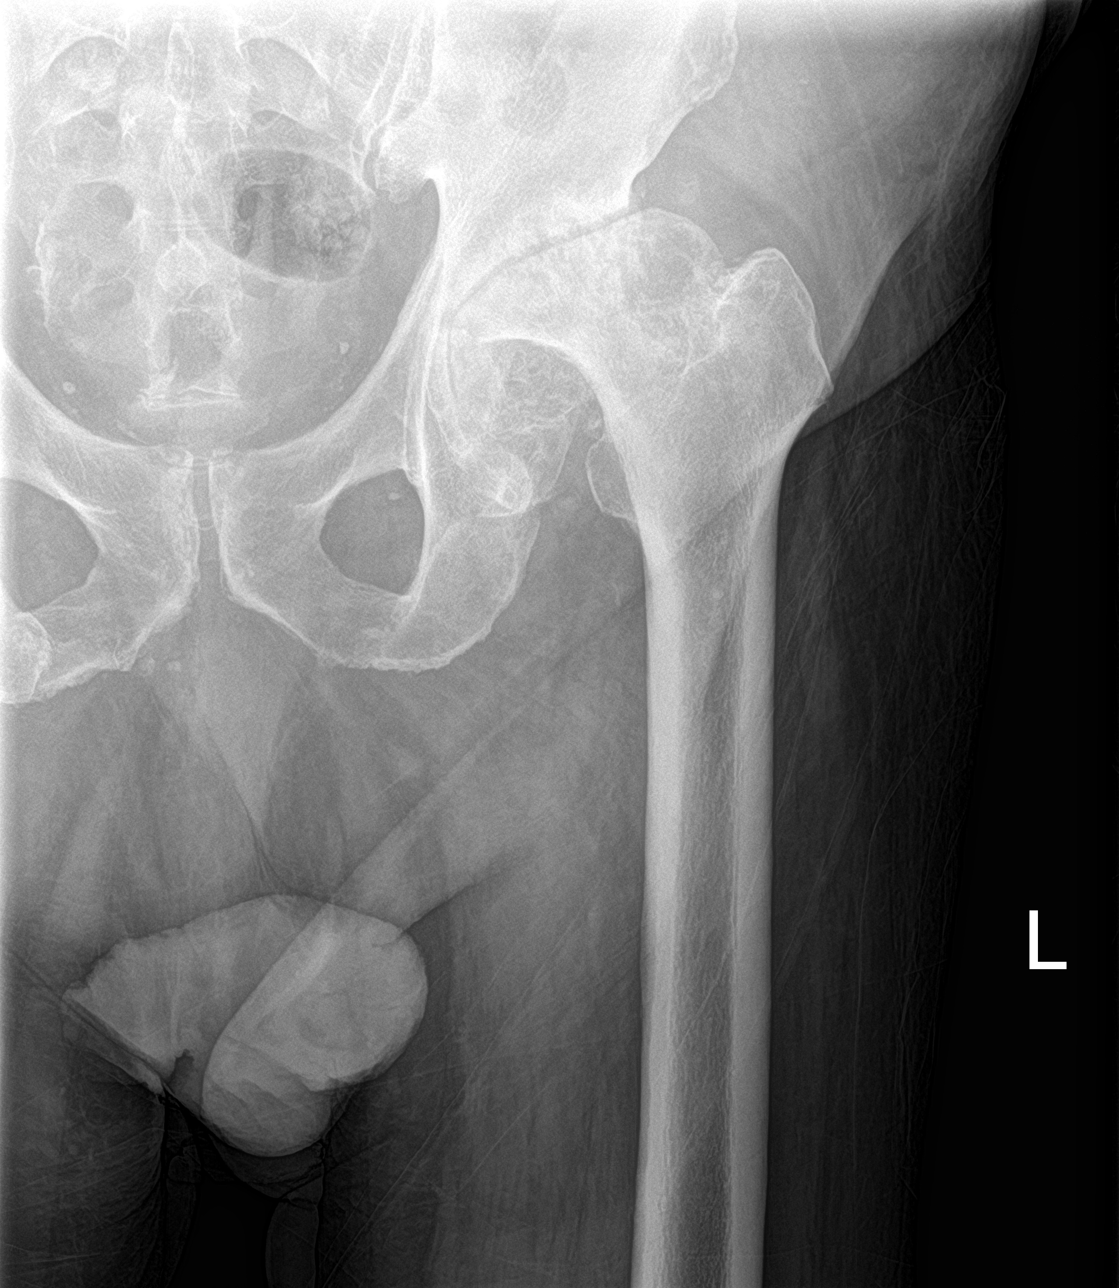

[hip lat]
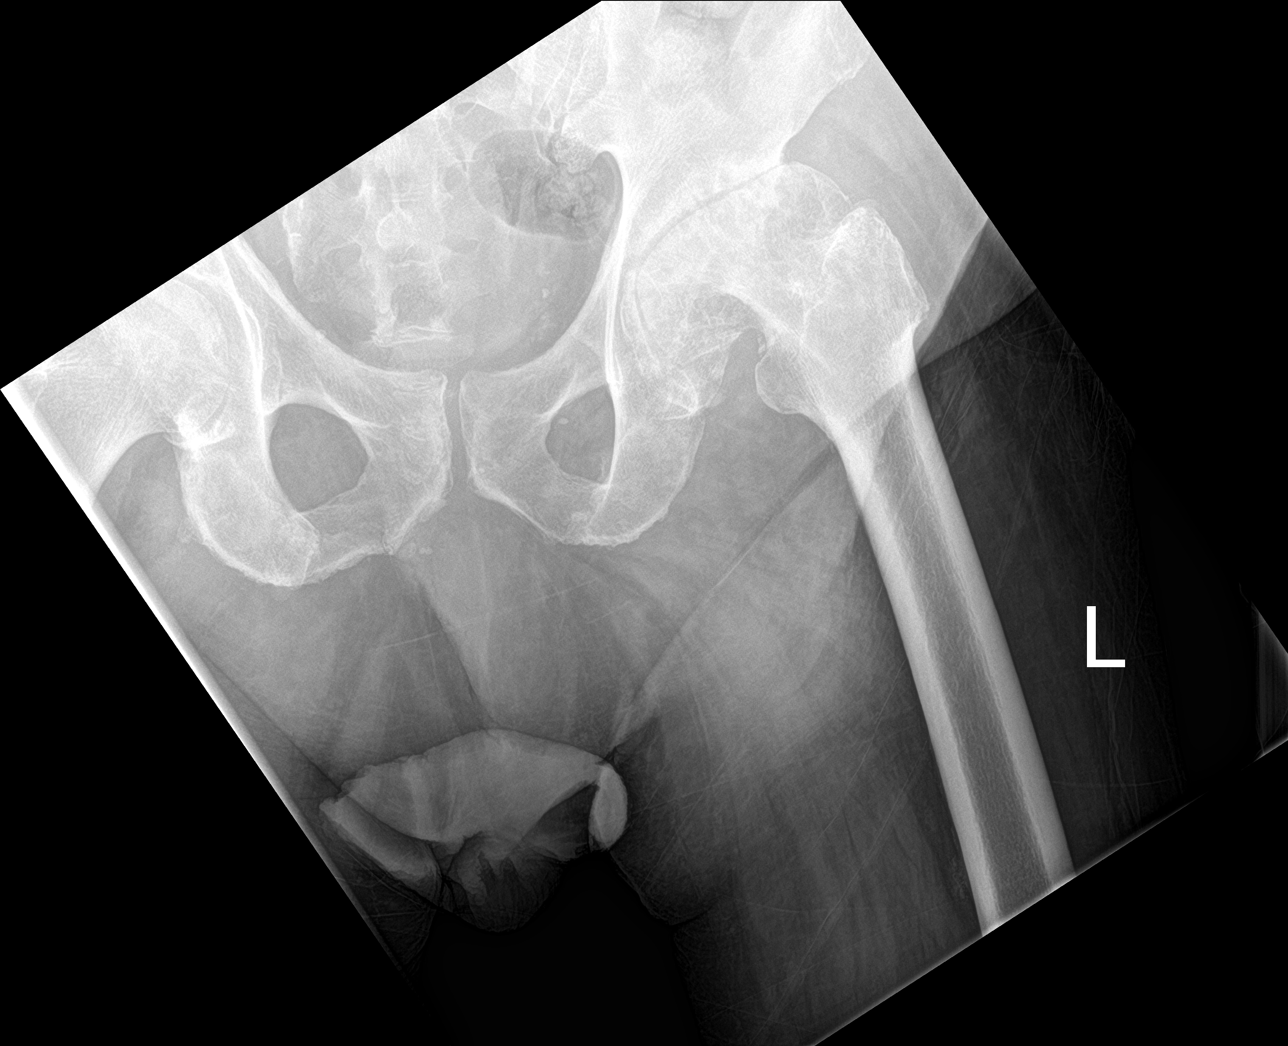

[3 of 3 positions shown; findings below may reference images not displayed]

FINDINGS: AP view the pelvis and AP/frog leg views of the left hip. Marked
left femoral head flattening and altered morphology, suggesting
developmental dysplasia. Concurrent joint space narrowing and
subchondral sclerosis within both femoral head and acetabulum.

No acute fracture or dislocation.
IMPRESSION: No significant change since the prior exam.

Left hip osteoarthritis, likely secondary to developmental
dysplasia.

## 2021-01-03 MED ORDER — DICLOFENAC SODIUM 1 % EX GEL
2.0000 g | Freq: Four times a day (QID) | CUTANEOUS | Status: DC
  Administered 2021-01-03 – 2021-01-30 (×90): 2 g via TOPICAL
  Filled 2021-01-03 (×3): qty 100

## 2021-01-03 MED ORDER — ALLOPURINOL 300 MG PO TABS
300.0000 mg | ORAL_TABLET | Freq: Every day | ORAL | Status: DC
  Administered 2021-01-04 – 2021-01-30 (×27): 300 mg via ORAL
  Filled 2021-01-03 (×28): qty 1

## 2021-01-03 MED ORDER — PROCHLORPERAZINE 25 MG RE SUPP
12.5000 mg | Freq: Four times a day (QID) | RECTAL | Status: DC | PRN
Start: 2021-01-03 — End: 2021-01-30

## 2021-01-03 MED ORDER — LOSARTAN POTASSIUM 50 MG PO TABS
100.0000 mg | ORAL_TABLET | Freq: Every day | ORAL | Status: DC
  Administered 2021-01-04 – 2021-01-28 (×15): 100 mg via ORAL
  Filled 2021-01-03 (×29): qty 2

## 2021-01-03 MED ORDER — GUAIFENESIN-DM 100-10 MG/5ML PO SYRP
5.0000 mL | ORAL_SOLUTION | Freq: Four times a day (QID) | ORAL | Status: DC | PRN
  Administered 2021-01-04 – 2021-01-25 (×25): 10 mL via ORAL
  Filled 2021-01-03 (×31): qty 10

## 2021-01-03 MED ORDER — BISACODYL 10 MG RE SUPP: 10.0000 mg | Freq: Every day | RECTAL | Status: DC | PRN

## 2021-01-03 MED ORDER — CARVEDILOL 12.5 MG PO TABS
12.5000 mg | ORAL_TABLET | Freq: Two times a day (BID) | ORAL | Status: DC
  Administered 2021-01-03 – 2021-01-30 (×54): 12.5 mg via ORAL
  Filled 2021-01-03 (×55): qty 1

## 2021-01-03 MED ORDER — FLUTICASONE PROPIONATE 50 MCG/ACT NA SUSP
1.0000 | Freq: Every day | NASAL | Status: DC | PRN
  Administered 2021-01-11 – 2021-01-28 (×14): 1 via NASAL
  Filled 2021-01-03: qty 16

## 2021-01-03 MED ORDER — PROCHLORPERAZINE MALEATE 5 MG PO TABS: 5.0000 mg | ORAL_TABLET | Freq: Four times a day (QID) | ORAL | Status: DC | PRN

## 2021-01-03 MED ORDER — POTASSIUM CHLORIDE CRYS ER 10 MEQ PO TBCR
10.0000 meq | EXTENDED_RELEASE_TABLET | Freq: Every day | ORAL | Status: DC
  Administered 2021-01-04: 10 meq via ORAL
  Filled 2021-01-03: qty 1

## 2021-01-03 MED ORDER — PHENOL 1.4 % MT LIQD
1.0000 | OROMUCOSAL | Status: DC | PRN
  Filled 2021-01-03: qty 177

## 2021-01-03 MED ORDER — POLYETHYLENE GLYCOL 3350 17 G PO PACK: 17.0000 g | PACK | Freq: Every day | ORAL | Status: DC | PRN

## 2021-01-03 MED ORDER — ALUM & MAG HYDROXIDE-SIMETH 200-200-20 MG/5ML PO SUSP
30.0000 mL | ORAL | Status: DC | PRN
  Filled 2021-01-03: qty 30

## 2021-01-03 MED ORDER — LORATADINE 10 MG PO TABS
10.0000 mg | ORAL_TABLET | Freq: Every day | ORAL | Status: DC | PRN
  Administered 2021-01-11 – 2021-01-22 (×9): 10 mg via ORAL
  Filled 2021-01-03 (×9): qty 1

## 2021-01-03 MED ORDER — INSULIN ASPART 100 UNIT/ML IJ SOLN: 0.0000 [IU] | Freq: Every day | INTRAMUSCULAR | Status: DC

## 2021-01-03 MED ORDER — MENTHOL 3 MG MT LOZG
1.0000 | LOZENGE | OROMUCOSAL | Status: DC | PRN
  Filled 2021-01-03: qty 9

## 2021-01-03 MED ORDER — PANTOPRAZOLE SODIUM 40 MG PO TBEC
40.0000 mg | DELAYED_RELEASE_TABLET | Freq: Every day | ORAL | Status: DC
  Administered 2021-01-04 – 2021-01-30 (×27): 40 mg via ORAL
  Filled 2021-01-03 (×28): qty 1

## 2021-01-03 MED ORDER — HEPARIN SODIUM (PORCINE) 5000 UNIT/ML IJ SOLN
5000.0000 [IU] | Freq: Three times a day (TID) | INTRAMUSCULAR | Status: DC
Start: 2021-01-03 — End: 2021-01-13
  Administered 2021-01-03 – 2021-01-13 (×30): 5000 [IU] via SUBCUTANEOUS
  Filled 2021-01-03 (×30): qty 1

## 2021-01-03 MED ORDER — TRAZODONE HCL 50 MG PO TABS
25.0000 mg | ORAL_TABLET | Freq: Every evening | ORAL | Status: DC | PRN
Start: 2021-01-03 — End: 2021-01-30
  Administered 2021-01-06 – 2021-01-29 (×24): 50 mg via ORAL
  Filled 2021-01-03 (×24): qty 1

## 2021-01-03 MED ORDER — FLEET ENEMA 7-19 GM/118ML RE ENEM
1.0000 | ENEMA | Freq: Once | RECTAL | Status: DC | PRN
Start: 2021-01-03 — End: 2021-01-30

## 2021-01-03 MED ORDER — BACITRACIN ZINC 500 UNIT/GM EX OINT
TOPICAL_OINTMENT | Freq: Two times a day (BID) | CUTANEOUS | Status: DC
  Filled 2021-01-03: qty 28.4

## 2021-01-03 MED ORDER — PROCHLORPERAZINE EDISYLATE 10 MG/2ML IJ SOLN: 5.0000 mg | Freq: Four times a day (QID) | INTRAMUSCULAR | Status: DC | PRN

## 2021-01-03 MED ORDER — SENNOSIDES-DOCUSATE SODIUM 8.6-50 MG PO TABS
2.0000 | ORAL_TABLET | Freq: Every day | ORAL | Status: DC
  Administered 2021-01-03 – 2021-01-11 (×7): 2 via ORAL
  Filled 2021-01-03 (×8): qty 2

## 2021-01-03 MED ORDER — DIPHENHYDRAMINE HCL 12.5 MG/5ML PO ELIX
12.5000 mg | ORAL_SOLUTION | Freq: Four times a day (QID) | ORAL | Status: DC | PRN
  Filled 2021-01-03: qty 10

## 2021-01-03 MED ORDER — ACETAMINOPHEN 325 MG PO TABS
325.0000 mg | ORAL_TABLET | ORAL | Status: DC | PRN
  Administered 2021-01-15 – 2021-01-30 (×20): 650 mg via ORAL
  Filled 2021-01-03 (×21): qty 2

## 2021-01-03 MED ORDER — FUROSEMIDE 40 MG PO TABS
40.0000 mg | ORAL_TABLET | Freq: Every day | ORAL | Status: DC
  Administered 2021-01-04 – 2021-01-30 (×24): 40 mg via ORAL
  Filled 2021-01-03 (×28): qty 1

## 2021-01-03 MED ORDER — INSULIN ASPART 100 UNIT/ML IJ SOLN
0.0000 [IU] | Freq: Three times a day (TID) | INTRAMUSCULAR | Status: DC
Start: 2021-01-03 — End: 2021-01-23
  Administered 2021-01-11: 1 [IU] via SUBCUTANEOUS

## 2021-01-03 MED ORDER — ASCORBIC ACID 500 MG PO TABS
500.0000 mg | ORAL_TABLET | Freq: Every day | ORAL | Status: DC
  Administered 2021-01-04 – 2021-01-30 (×27): 500 mg via ORAL
  Filled 2021-01-03 (×27): qty 1

## 2021-01-03 MED ORDER — HEPARIN SODIUM (PORCINE) 5000 UNIT/ML IJ SOLN
5000.0000 [IU] | Freq: Two times a day (BID) | INTRAMUSCULAR | Status: DC
Start: 2021-01-03 — End: 2021-01-03

## 2021-01-03 NOTE — H&P (Signed)
Physical Medicine and Rehabilitation Admission H&P    CC: Functional deficits.   HPI: Logan [name] is a 75 year old male with history of HTN, A fib, OSA, DVT, chronic diastolic CHF, 0000000 controlled who started developing weakness with numbness and tingling BLE for past 6 months which progressed to falls and inability to walk. He was essentially wheelchair bound for the past month with son checking in frequently during the day and assisting with meals. He has had multiple ED visits and on last in 07/22, was found to have cervical myelopathy C3/C4 due to severe canal stenosis with anterolisthesis C3 on C4. He was cleared for surgery by PCP/cardiology, Eliquis placed on hold and was admitted on 01/01/21 for ACDF C3-C4 by Dr. Reatha Armour. Post op reporting  improvement in sensation and strength of BUE, improvement in speech clarity as well as pain. He has developed cough with hypoxia as well as difficulty with meals felt to be due to intubation. Therapy evaluations completed revealing difficulty with ADLs due to BUE weakness, balance deficits, Leg length discrepancy as well as decreased endurance. CIR recommended due to functional decline. C/o cough and left hip pain.    Review of Systems  Constitutional:  Negative for chills and fever.  HENT:  Positive for hearing loss.   Eyes:  Positive for blurred vision (getting better since surgery). Negative for discharge.  Respiratory:  Positive for cough (productive cough since last night) and shortness of breath. Negative for stridor.   Cardiovascular:  Negative for chest pain and palpitations.  Gastrointestinal:  Positive for constipation. Negative for heartburn, nausea and vomiting.  Genitourinary:  Negative for dysuria and urgency.  Musculoskeletal:  Positive for joint pain (left hip pain--bone on bone), myalgias and neck pain.  Skin:  Negative for rash.  Neurological:  Positive for sensory change (numbness tingling bilateral hands getting better.  Used to have sharp stinging pain going down his back.) and speech change (Was very garbled before but  speech clarity improving since surgery.).  Psychiatric/Behavioral:  The patient has insomnia (due to cough).     Past Medical History:  Diagnosis Date   Anxiety    Atherosclerotic renal artery stenosis, unilateral (HCC) 02/28/2014   Atrial fibrillation (HCC)    Bilateral lower extremity edema    Chronic diastolic heart failure ([place]) 10/21/2019   Diabetes mellitus without complication (HCC)    DVT (deep venous thrombosis) (HCC)    Esophageal reflux    Essential hypertension 10/21/2019   Gout    Hyperlipidemia    Hypertension    Mixed hyperlipidemia 10/21/2019   Morbid obesity ([place])    Obesity (BMI 30-39.9) 08/24/2019   Orthopnea    Osteoarthritis    Persistent atrial fibrillation ([place]) 10/21/2019   Seasonal allergies    Sleep apnea     Past Surgical History:  Procedure Laterality Date   ABDOMINAL AORTAGRAM N/A 02/28/2014   Procedure: ABDOMINAL Maxcine Ham;  Surgeon: Serafina Mitchell, MD;  Location[facility] C[facility] LAB;  Service: Cardiovascular;  Laterality: N/A;   ANTERIOR CERVICAL DECOMP/DISCECTOMY FUSION N/A 01/01/2021   Procedure: ACDF C3-4;  Surgeon: Dawley, Theodoro Doing, DO;  Location: [place];  Service: Neurosurgery;  Laterality: N/A;   APPENDECTOMY     ATRIAL FIBRILLATION ABLATION N/A 02/29/2020   Procedure: ATRIAL FIBRILLATION ABLATION;  Surgeon: Constance Haw, MD;  Location: [place] CV LAB;  Service: Cardiovascular;  Laterality: N/A;   CARDIOVERSION N/A 10/18/2020   Procedure: CARDIOVERSION (CATH LAB);  Surgeon: Constance Haw, MD;  Location:  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 Used to  work in a [employer]. He  reports that he has quit smoking. He has quit using smokeless tobacco.  His smokeless tobacco use included chew. He reports that he does not drink alcohol and does not use drugs.   Allergies: No Known Allergies   Medications Prior to Admission  Medication Sig Dispense Refill   acetaminophen (TYLENOL) 500 MG tablet Take 1,000 mg by mouth every 6 (six) hours as needed for mild pain.     allopurinol (ZYLOPRIM) 300 MG tablet Take 300 mg by mouth daily.     apixaban (ELIQUIS) 5 MG TABS tablet Take 1 tablet (5 mg total) by mouth 2 (two) times daily. 60 tablet 5   carvedilol (COREG) 12.5 MG tablet Take 12.5 mg by mouth 2 (two) times daily with a meal.     DHEA 25 MG CAPS Take 50 mg by mouth daily.     fluticasone (FLONASE) 50 MCG/ACT nasal spray Place 1 spray into both nostrils daily as needed for rhinitis.      furosemide (LASIX) 40 MG tablet Take 1 tablet (40 mg total) by mouth daily. (Patient taking differently: Take 40 mg by mouth 2 (two) times daily.) 90 tablet 3   ketoconazole (NIZORAL) 2 % cream Apply 1 application topically daily as needed for irritation.      losartan (COZAAR) 100 MG tablet Take 100 mg by mouth daily.     Multiple Vitamin (MULTIVITAMIN) tablet Take 1 tablet by mouth daily.     omeprazole (PRILOSEC) 20 MG capsule Take 20 mg by mouth daily as needed (for heartburn).      potassium chloride (MICRO-K) 10 MEQ CR capsule Take 10 mEq by mouth daily.     traMADol (ULTRAM) 50 MG tablet Take 50 mg by mouth every 12 (twelve) hours as needed for moderate pain.     loratadine (CLARITIN) 10 MG tablet Take 10 mg by mouth daily as needed for allergies.      Drug Regimen Review  Drug regimen was reviewed and remains appropriate with no significant issues identified  Home: Home Living Family/patient expects to be discharged to:: Private residence Living Arrangements: Alone Available Help at Discharge: Family, Available PRN/intermittently Type of Home: Mobile  home Home Access: Ramped entrance Home Layout: One level Bathroom Shower/Tub: Chiropodist: Standard Bathroom Accessibility: No Home Equipment: Environmental consultant - 4 wheels Additional Comments: Sons both work and have families   Functional History: Prior Function Level of Independence: Needs assistance Gait / Transfers Assistance Needed: ambulating with Rollator, and using WC from MD office for visits ADL's / Homemaking Assistance Needed: assist for IADL, sponge bathing, assist from Son as son is able Communication / Swallowing Assistance Needed: garbled speech  Functional Status:  Mobility: Bed Mobility Overal bed mobility: Needs Assistance Bed Mobility: Rolling, Sidelying to Sit Rolling: Min assist Sidelying to sit: Mod assist, +2 for safety/equipment, HOB elevated General bed mobility comments: assist for trunk elevation, BLE off the bed, and cues for sequencing Transfers Overall transfer level: Needs assistance Equipment used: Rolling walker (2 wheeled) Transfers: Sit to/from Stand, Stand  Pivot Transfers Sit to Stand: Mod assist, +2 physical assistance, +2 safety/equipment, From elevated surface Stand pivot transfers: Mod assist, +2 safety/equipment General transfer comment: vc for safe hand placement, mod A +2 to power into standing and for initial balance assist, berbal cues for hand placement, pt initially with posterior lean/retropulsion Ambulation/Gait Ambulation/Gait assistance: Min assist, Mod assist, +2 safety/equipment Gait Distance (Feet): 30 Feet Assistive device: Rolling walker (2 wheeled) Gait Pattern/deviations: Step-through pattern, Decreased stride length, Narrow base of support General Gait Details: pt's R LE longer than L therefore pt amb with a limp, pt with decreased endurance, fatigued quickly, minA for management of RW during turns, pt did walker backwards about 2 feet to turn around at window in room Gait velocity: dec    ADL: ADL Overall  ADL's : Needs assistance/impaired Eating/Feeding: Minimal assistance, Sitting, With adaptive utensils Eating/Feeding Details (indicate cue type and reason): assist to open containers, apply condiments, decreased shoulder ROM and trouble bringing food to mouth due to decrease ROM and sensatoin. Pt provided with built up handles (washcloth taped around utensil) which he reports helps. often Pt has to use BUE to bring food/drink to mouth Grooming: Wash/dry face, Moderate assistance, Sitting Grooming Details (indicate cue type and reason): EOB, assist to support RUE at elbow for Pt to be able to reach face Upper Body Bathing: Moderate assistance Lower Body Bathing: Maximal assistance Upper Body Dressing : Maximal assistance, Sitting Upper Body Dressing Details (indicate cue type and reason): to change gown Lower Body Dressing: Maximal assistance Toilet Transfer: Moderate assistance, +2 for physical assistance, +2 for safety/equipment, Ambulation, RW Toilet Transfer Details (indicate cue type and reason): mod A for initial boost into standing with vc for safe hand placement Toileting- Clothing Manipulation and Hygiene: Maximal assistance, Sit to/from stand, Min guard, Sitting/lateral lean Toileting - Clothing Manipulation Details (indicate cue type and reason): initially performing incomplete peri care leaning side to side (unsafely requiring assit) and then max A in standing for finishing peri care Functional mobility during ADLs: Moderate assistance, +2 for physical assistance, +2 for safety/equipment, Rolling walker, Cueing for safety General ADL Comments: decreased functional use of BUE, decreased balance, decreased strength  Cognition: Cognition Overall Cognitive Status: No family/caregiver present to determine baseline cognitive functioning Orientation Level: Oriented X4 Cognition Arousal/Alertness: Awake/alert Behavior During Therapy: WFL for tasks assessed/performed Overall Cognitive  Status: No family/caregiver present to determine baseline cognitive functioning Area of Impairment: Safety/judgement Memory: Decreased recall of precautions Safety/Judgement: Decreased awareness of safety, Decreased awareness of deficits General Comments: pt pleasant and eager to get better and go home, pt follows commands appropriately and is not impulsive    Blood pressure 125/68, pulse 65, temperature 98 F (36.7 C), temperature source Oral, resp. rate 16, height '5\' 7"'$  (1.702 m), weight 83.5 kg, SpO2 95 %. Physical Exam Constitutional:      Appearance: Normal appearance. He is obese.  Neck:     Comments: Limited by Collar--honeycomb dressing with dried bloody drainage.  Cardiovascular:     Rate and Rhythm: Rhythm irregular.  Abdominal:     General: There is distension.     Tenderness: There is no abdominal tenderness.  Skin:    Comments: Bilateral knees with quarter size abraded area R>L with moist appearing yellow superficial  slough.  Neurological:     Mental Status: He is alert and oriented to person, place, and time.     Comments: Mild left facial weakness--edentulous (does not wear dentures) with moderate dysarthria. Intermittent weak cough noted. Able to  follow simple motor commands without difficulty. Right > left deltoid weakness.     Results for orders placed or performed during the hospital encounter of 01/01/21 (from the past 48 hour(s))  Glucose, capillary     Status: None   Collection Time: 01/01/21 12:54 PM  Result Value Ref Range   Glucose-Capillary 92 70 - 99 mg/dL    Comment: Glucose reference range applies only to samples taken after fasting for at least 8 hours.  CBC     Status: Abnormal   Collection Time: 01/01/21  6:29 PM  Result Value Ref Range   WBC 9.6 4.0 - 10.5 K/uL   RBC 3.60 (L) 4.22 - 5.81 MIL/uL   Hemoglobin 12.2 (L) 13.0 - 17.0 g/dL   HCT 36.2 (L) 39.0 - 52.0 %   MCV 100.6 (H) 80.0 - 100.0 fL   MCH 33.9 26.0 - 34.0 pg   MCHC 33.7 30.0 - 36.0  g/dL   RDW 14.1 11.5 - 15.5 %   Platelets 310 150 - 400 K/uL   nRBC 0.0 0.0 - 0.2 %    Comment: Performed at [facility], [place] [address], [place],  [id number]  Creatinine, serum     Status: None   Collection Time: 01/01/21  6:29 PM  Result Value Ref Range   Creatinine, Ser 0.73 0.61 - 1.24 mg/dL   GFR, Estimated >60 >60 mL/min    Comment: (NOTE) Calculated using the CKD-EPI Creatinine Equation (2021) Performed at [facility] [address] [place], [place] [address]   Glucose, capillary     Status: Abnormal   Collection Time: 01/01/21 10:23 PM  Result Value Ref Range   Glucose-Capillary 124 (H) 70 - 99 mg/dL    Comment: Glucose reference range applies only to samples taken after fasting for at least 8 hours.  Glucose, capillary     Status: Abnormal   Collection Time: 01/02/21  9:12 PM  Result Value Ref Range   Glucose-Capillary 111 (H) 70 - 99 mg/dL    Comment: Glucose reference range applies only to samples taken after fasting for at least 8 hours.  Glucose, capillary     Status: Abnormal   Collection Time: 01/03/21  7:38 AM  Result Value Ref Range   Glucose-Capillary 144 (H) 70 - 99 mg/dL    Comment: Glucose reference range applies only to samples taken after fasting for at least 8 hours.   DG Cervical Spine 2 or 3 views  Result Date: 01/01/2021 CLINICAL DATA:  Status post C3-4 ACDF EXAM: CERVICAL SPINE - 2-3 VIEW COMPARISON:  MRI 11/03/2020 FINDINGS: Anterior and interbody fusion hardware noted at C3-4. The hardware is in good position. No complicating features are identified. Expected mild postoperative prevertebral soft tissue swelling. IMPRESSION: Anterior and interbody fusion hardware at C3-4. Electronically Signed   By: Marijo Sanes M.D.   On: 01/01/2021 14:30   DG C-Arm 1-60 Min-No Report  Result Date: 01/01/2021 Fluoroscopy was utilized by the requesting physician.  No radiographic interpretation.       Medical Problem List and Plan: 1.   Cervical myelopathy  -patient may shower but incision must be covered  -ELOS/Goals: 10-14 days modI  Admit to CIR 2.  Antithrombotics: -DVT/anticoagulation:  Pharmaceutical: Heparin--low dose. Was on Eliquis PTA--held due to surgery--> to resume on 09/23 per recs.  -antiplatelet therapy: N/A 3. Endstate OA left hip/Pain Management: Oxycodone prn  --will add voltaren gel to left hip QID  -pain worsening, will get repeat XR  today- he would like a joint injection 4. Mood: LCSW to follow for evaluation and support.   -antipsychotic agents: N/A 5. Neuropsych: This patient is capable of making decisions on his own behalf. 6. Skin/Wound Care:  Routine pressure relief measures--monitor incision for healing.   --D/c bacitracin ointemtn--add Aquacell with dry dressing to bilateral knee abrasions.  7. Fluids/Electrolytes/Nutrition: Monitor I/O.  --will order post op CMET in am 8. CAF: Monitor HR TID--continue to hold eliquis till 09/23  --On coreg BID.  9.  Dehydration: Encourage fluid intake. Monitor orthostatic symptoms 10. Hypoxia/ Productive cough: Will down grade diet to dysphagia 3 with aspiration precautions/supervison  --has been cleared to remove collar for meals.   --CXR ordered to rule out aspiration event.  11. T2DM diet controlled?: Has been off meds for 5 years.  --will order CBGs for monitoring, Check A1C in am. CBGS 87-144: provide dietary education. 12. HTN: Monitor BP TID--continue Lasix and Cozaar. BP currently soft but as expected for SCI 13. Anemia: Pernicious? W/MCV 104. Will check vitamin B 12 levels --Post op CBC ordered for tomorrow.  14. OSA: Does not use CPAP. 15. Constipation: Has not had BM for a couple of days  --will add Senna S.  16. Overweight BMI 28.82: provide dietary education  I have personally performed a face to face diagnostic evaluation, including, but not limited to relevant history and physical exam findings, of this patient and developed relevant  assessment and plan.  Additionally, I have reviewed and concur with the physician assistant's documentation above.  Leeroy Cha, MD  Bary Leriche, PA-C 01/03/2021

## 2021-01-03 NOTE — Progress Notes (Signed)
Subjective: Patient reports that he is doing well and is continuing to fell that he is making small improvements. He has no pain complaints. He did report some coughing and sneezing overnight. No acute events overnight.   Objective: Vital signs in last 24 hours: Temp:  [97.7 F (36.5 C)-98.2 F (36.8 C)] 98 F (36.7 C) (09/15 0737) Pulse Rate:  [66-72] 69 (09/15 0737) Resp:  [14-21] 20 (09/15 0737) BP: (107-125)/(50-80) 125/68 (09/15 0737) SpO2:  [88 %-100 %] 92 % (09/15 0737)  Intake/Output from previous day: 09/14 0701 - 09/15 0700 In: 1423.1 [P.O.:900; I.V.:523.1] Out: 1275 [Urine:1275] Intake/Output this shift: No intake/output data recorded.  Physical Exam: Patient is awake, A/O X 4, conversant, and in good spirits. They are in NAD and VSS. Doing well. Speech is fluent and appropriate. Right deltoid 2/5, Left deltoid 3/5, Bi, Tri, grip 3/5. BLE 2/5 throughout. PERLA, EOMI. CNs grossly intact. Dressing is dry and intact with a small amount of old drainage along the medial edge. Incision is well approximated with no drainage, erythema, or edema. Hard cervical collar in place.  Lab Results: Recent Labs    01/01/21 1829  WBC 9.6  HGB 12.2*  HCT 36.2*  PLT 310   BMET Recent Labs    01/01/21 1829  CREATININE 0.73    Studies/Results: DG Cervical Spine 2 or 3 views  Result Date: 01/01/2021 CLINICAL DATA:  Status post C3-4 ACDF EXAM: CERVICAL SPINE - 2-3 VIEW COMPARISON:  MRI 11/03/2020 FINDINGS: Anterior and interbody fusion hardware noted at C3-4. The hardware is in good position. No complicating features are identified. Expected mild postoperative prevertebral soft tissue swelling. IMPRESSION: Anterior and interbody fusion hardware at C3-4. Electronically Signed   By: Marijo Sanes M.D.   On: 01/01/2021 14:30   DG C-Arm 1-60 Min-No Report  Result Date: 01/01/2021 Fluoroscopy was utilized by the requesting physician.  No radiographic interpretation.     Assessment/Plan: Patient is post-op day 2 s/p C3/4 ACDF. He is continuing to recover well and is making slow progress. His only complaint overnight was of coughing and sneezing. He has remained afebrile. He continues to be severely debilitated due to the cervical myelopathy and will need CIR/SNF placement for later convalescence. He has worked with PT and OT who are also both recommending CIR or SNF depending on insurance approval. Continue hard cervical collar.  Continue to hold Eliquis. Plan to restart Eliquis on 01/11/21. Continue working on pain control, mobility and ambulating patient. Awaiting CIR/SNF placement status.       LOS: 2 days     Marvis Moeller, DNP, NP-C 01/03/2021, 8:20 AM

## 2021-01-03 NOTE — Progress Notes (Signed)
Attempted to take patient off oxygen.  Oxygen saturation dropped to 86-88% on RA while sleeping.  Placed oxygen back on at 2L/[place] and oxygen saturation slowly came back up to 92-95%.  Will continue to monitor.      01/03/21 1013 01/03/21 1014 01/03/21 1016  Oxygen Therapy  SpO2 (!) 86 % (!) 87 % 92 %  O2 Device Room Air Room Air Nasal Cannula  O2 Flow Rate (L/min)  --   --  2 L/min    01/03/21 1017  Oxygen Therapy  SpO2 95 %  O2 Device Nasal Cannula  O2 Flow Rate (L/min) 2 L/min

## 2021-01-03 NOTE — H&P (Signed)
Physical Medicine and Rehabilitation Admission H&P    CC: Functional deficits.  HPI: Logan [name] is a 75 year old male with history of HTN, A fib, OSA, DVT, chronic diastolic CHF, 0000000 controlled who started developing weakness with numbness and tingling BLE for past 6 months which progressed to falls and inability to walk. He was essentially wheelchair bound for the past month with son checking in frequently during the day and assisting with meals. He has had multiple ED visits and on last in 07/22, was found to have cervical myelopathy C3/C4 due to severe canal stenosis with anterolisthesis C3 on C4. He was cleared for surgery by PCP/cardiology, Eliquis placed on hold and was admitted on 01/01/21 for ACDF C3-C4 by Dr. Reatha Armour. Post op reporting  improvement in sensation and strength of BUE, improvement in speech clarity as well as pain. He has developed cough with hypoxia as well as difficulty with meals felt to be due to intubation. Therapy evaluations completed revealing difficulty with ADLs due to BUE weakness, balance deficits, Leg length discrepancy as well as decreased endurance. CIR recommended due to functional decline. C/o cough and left hip pain.    Review of Systems  Constitutional:  Negative for chills and fever.  HENT:  Positive for hearing loss.   Eyes:  Positive for blurred vision (getting better since surgery). Negative for discharge.  Respiratory:  Positive for cough (productive cough since last night) and shortness of breath. Negative for stridor.   Cardiovascular:  Negative for chest pain and palpitations.  Gastrointestinal:  Positive for constipation. Negative for heartburn, nausea and vomiting.  Genitourinary:  Negative for dysuria and urgency.  Musculoskeletal:  Positive for joint pain (left hip pain--bone on bone), myalgias and neck pain.  Skin:  Negative for rash.  Neurological:  Positive for sensory change (numbness tingling bilateral hands getting better. Used  to have sharp stinging pain going down his back.) and speech change (Was very garbled before but  speech clarity improving since surgery.).  Psychiatric/Behavioral:  The patient has insomnia (due to cough).     Past Medical History:  Diagnosis Date   Anxiety    Atherosclerotic renal artery stenosis, unilateral (HCC) 02/28/2014   Atrial fibrillation (HCC)    Bilateral lower extremity edema    Chronic diastolic heart failure ([place]) 10/21/2019   Diabetes mellitus without complication (HCC)    DVT (deep venous thrombosis) (HCC)    Esophageal reflux    Essential hypertension 10/21/2019   Gout    Hyperlipidemia    Hypertension    Mixed hyperlipidemia 10/21/2019   Morbid obesity ([place])    Obesity (BMI 30-39.9) 08/24/2019   Orthopnea    Osteoarthritis    Persistent atrial fibrillation ([place]) 10/21/2019   Seasonal allergies    Sleep apnea     Past Surgical History:  Procedure Laterality Date   ABDOMINAL AORTAGRAM N/A 02/28/2014   Procedure: ABDOMINAL Maxcine Ham;  Surgeon: Serafina Mitchell, MD;  Location: [facility] CATH LAB;  Service: Cardiovascular;  Laterality: N/A;   ANTERIOR CERVICAL DECOMP/DISCECTOMY FUSION N/A 01/01/2021   Procedure: ACDF C3-4;  Surgeon: Dawley, Theodoro Doing, DO;  Location: [facility];  Service: Neurosurgery;  Laterality: N/A;   APPENDECTOMY     ATRIAL FIBRILLATION ABLATION N/A 02/29/2020   Procedure: ATRIAL FIBRILLATION ABLATION;  Surgeon: Constance Haw, MD;  Location: [facility] CV LAB;  Service: Cardiovascular;  Laterality: N/A;   CARDIOVERSION N/A 10/18/2020   Procedure: CARDIOVERSION (CATH LAB);  Surgeon: Constance Haw, MD;  Location: [facility]  INVASIVE CV LAB;  Service: Cardiovascular;  Laterality: N/A;   CARPAL TUNNEL RELEASE Left     Family History  Problem Relation Age of Onset   Heart disease Mother    Cancer Father        throat cancer   Heart disease Brother    Atrial fibrillation Brother     Social History:  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 Used to work  in a [employer]. He  reports that he has quit smoking. He has quit using smokeless tobacco.  His smokeless tobacco use included chew. He reports that he does not drink alcohol and does not use drugs.   Allergies: No Known Allergies   Medications Prior to Admission  Medication Sig Dispense Refill   acetaminophen (TYLENOL) 500 MG tablet Take 1,000 mg by mouth every 6 (six) hours as needed for mild pain.     allopurinol (ZYLOPRIM) 300 MG tablet Take 300 mg by mouth daily.     apixaban (ELIQUIS) 5 MG TABS tablet Take 1 tablet (5 mg total) by mouth 2 (two) times daily. 60 tablet 5   carvedilol (COREG) 12.5 MG tablet Take 12.5 mg by mouth 2 (two) times daily with a meal.     DHEA 25 MG CAPS Take 50 mg by mouth daily.     fluticasone (FLONASE) 50 MCG/ACT nasal spray Place 1 spray into both nostrils daily as needed for rhinitis.      furosemide (LASIX) 40 MG tablet Take 1 tablet (40 mg total) by mouth daily. (Patient taking differently: Take 40 mg by mouth 2 (two) times daily.) 90 tablet 3   ketoconazole (NIZORAL) 2 % cream Apply 1 application topically daily as needed for irritation.      loratadine (CLARITIN) 10 MG tablet Take 10 mg by mouth daily as needed for allergies.     losartan (COZAAR) 100 MG tablet Take 100 mg by mouth daily.     Multiple Vitamin (MULTIVITAMIN) tablet Take 1 tablet by mouth daily.     omeprazole (PRILOSEC) 20 MG capsule Take 20 mg by mouth daily as needed (for heartburn).      potassium chloride (MICRO-K) 10 MEQ CR capsule Take 10 mEq by mouth daily.     traMADol (ULTRAM) 50 MG tablet Take 50 mg by mouth every 12 (twelve) hours as needed for moderate pain.      Drug Regimen Review  Drug regimen was reviewed and remains appropriate with no significant issues identified  Home: Home Living Family/patient expects to be discharged to:: Private residence Living Arrangements: Alone Available Help at Discharge: Family, Available PRN/intermittently Type of Home: Mobile  home Home Access: Ramped entrance Home Layout: One level Bathroom Shower/Tub: Chiropodist: Standard Bathroom Accessibility: No Home Equipment: Environmental consultant - 4 wheels Additional Comments: Sons both work and have families   Functional History: Prior Function Level of Independence: Needs assistance Gait / Transfers Assistance Needed: ambulating with Rollator, and using WC from MD office for visits ADL's / Homemaking Assistance Needed: assist for IADL, sponge bathing, assist from Son as son is able Communication / Swallowing Assistance Needed: garbled speech   Functional Status:  Mobility: Bed Mobility Overal bed mobility: Needs Assistance Bed Mobility: Rolling, Sidelying to Sit Rolling: Min assist Sidelying to sit: Mod assist, +2 for safety/equipment, HOB elevated General bed mobility comments: assist for trunk elevation, BLE off the bed, and cues for sequencing Transfers Overall transfer level: Needs assistance Equipment used: Rolling walker (2 wheeled) Transfers: Sit to/from Stand, Stand  Pivot Transfers Sit to Stand: Mod assist, +2 physical assistance, +2 safety/equipment, From elevated surface Stand pivot transfers: Mod assist, +2 safety/equipment General transfer comment: vc for safe hand placement, mod A +2 to power into standing and for initial balance assist, berbal cues for hand placement, pt initially with posterior lean/retropulsion Ambulation/Gait Ambulation/Gait assistance: Min assist, Mod assist, +2 safety/equipment Gait Distance (Feet): 30 Feet Assistive device: Rolling walker (2 wheeled) Gait Pattern/deviations: Step-through pattern, Decreased stride length, Narrow base of support General Gait Details: pt's R LE longer than L therefore pt amb with a limp, pt with decreased endurance, fatigued quickly, minA for management of RW during turns, pt did walker backwards about 2 feet to turn around at window in room Gait velocity: dec   ADL: ADL Overall  ADL's : Needs assistance/impaired Eating/Feeding: Minimal assistance, Sitting, With adaptive utensils Eating/Feeding Details (indicate cue type and reason): assist to open containers, apply condiments, decreased shoulder ROM and trouble bringing food to mouth due to decrease ROM and sensatoin. Pt provided with built up handles (washcloth taped around utensil) which he reports helps. often Pt has to use BUE to bring food/drink to mouth Grooming: Wash/dry face, Moderate assistance, Sitting Grooming Details (indicate cue type and reason): EOB, assist to support RUE at elbow for Pt to be able to reach face Upper Body Bathing: Moderate assistance Lower Body Bathing: Maximal assistance Upper Body Dressing : Maximal assistance, Sitting Upper Body Dressing Details (indicate cue type and reason): to change gown Lower Body Dressing: Maximal assistance Toilet Transfer: Moderate assistance, +2 for physical assistance, +2 for safety/equipment, Ambulation, RW Toilet Transfer Details (indicate cue type and reason): mod A for initial boost into standing with vc for safe hand placement Toileting- Clothing Manipulation and Hygiene: Maximal assistance, Sit to/from stand, Min guard, Sitting/lateral lean Toileting - Clothing Manipulation Details (indicate cue type and reason): initially performing incomplete peri care leaning side to side (unsafely requiring assit) and then max A in standing for finishing peri care Functional mobility during ADLs: Moderate assistance, +2 for physical assistance, +2 for safety/equipment, Rolling walker, Cueing for safety General ADL Comments: decreased functional use of BUE, decreased balance, decreased strength   Cognition: Cognition Overall Cognitive Status: No family/caregiver present to determine baseline cognitive functioning Orientation Level: Oriented X4 Cognition Arousal/Alertness: Awake/alert Behavior During Therapy: WFL for tasks assessed/performed Overall Cognitive  Status: No family/caregiver present to determine baseline cognitive functioning Area of Impairment: Safety/judgement Memory: Decreased recall of precautions Safety/Judgement: Decreased awareness of safety, Decreased awareness of deficits General Comments: pt pleasant and eager to get better and go home, pt follows commands appropriately and is not impulsive    Blood pressure 139/78, pulse 60, temperature 99.7 F (37.6 C), temperature source Oral, resp. rate 18, height '5\' 7"'$  (1.702 m), weight 83.2 kg.   Physical Exam Constitutional:      Appearance: Normal appearance. He is obese.  Neck:     Comments: Limited by Collar--honeycomb dressing with dried bloody drainage.  Cardiovascular:     Rate and Rhythm: Rhythm irregular.  Abdominal:     General: There is distension.     Tenderness: There is no abdominal tenderness.  Skin:    Comments: Bilateral knees with quarter size abraded area R>L with moist appearing yellow superficial  slough.  Neurological:     Mental Status: He is alert and oriented to person, place, and time.     Comments: Mild left facial weakness--edentulous (does not wear dentures) with moderate dysarthria. Intermittent weak cough noted. Able to follow  simple motor commands without difficulty.  Motor exam: RUE: SA 2/5, EF/EE: 3/5, hand grip 4/5 LUE: SA 3/5, EF/EE 3/5, hand grip 4/5. Bilateral lower extremities 3/5.   Results for orders placed or performed during the hospital encounter of 01/01/21 (from the past 48 hour(s))  Glucose, capillary     Status: Abnormal   Collection Time: 01/01/21 10:23 PM  Result Value Ref Range   Glucose-Capillary 124 (H) 70 - 99 mg/dL    Comment: Glucose reference range applies only to samples taken after fasting for at least 8 hours.  Glucose, capillary     Status: Abnormal   Collection Time: 01/02/21  9:12 PM  Result Value Ref Range   Glucose-Capillary 111 (H) 70 - 99 mg/dL    Comment: Glucose reference range applies only to samples  taken after fasting for at least 8 hours.  Glucose, capillary     Status: Abnormal   Collection Time: 01/03/21  7:38 AM  Result Value Ref Range   Glucose-Capillary 144 (H) 70 - 99 mg/dL    Comment: Glucose reference range applies only to samples taken after fasting for at least 8 hours.   DG Chest 2 View  Result Date: 01/03/2021 CLINICAL DATA:  Persistent cough EXAM: CHEST - 2 VIEW COMPARISON:  11/13/2020 FINDINGS: Single frontal view of the chest demonstrates an unremarkable cardiac silhouette. Consolidation at the left lung base could reflect airspace disease or atelectasis. No effusion or pneumothorax. No acute bony abnormalities. Postsurgical changes are seen in the right supraclavicular region. IMPRESSION: 1. Left basilar consolidation, favor atelectasis given recent cervical spine surgery. Electronically Signed   By: Randa Ngo M.D.   On: 01/03/2021 17:14   DG HIP UNILAT WITH PELVIS 2-3 VIEWS LEFT  Result Date: 01/03/2021 CLINICAL DATA:  Left hip pain without trauma EXAM: DG HIP (WITH OR WITHOUT PELVIS) 2-3V LEFT COMPARISON:  11/05/2020 FINDINGS: AP view the pelvis and AP/frog leg views of the left hip. Marked left femoral head flattening and altered morphology, suggesting developmental dysplasia. Concurrent joint space narrowing and subchondral sclerosis within both femoral head and acetabulum. No acute fracture or dislocation. IMPRESSION: No significant change since the prior exam. Left hip osteoarthritis, likely secondary to developmental dysplasia. Electronically Signed   By: Abigail Miyamoto M.D.   On: 01/03/2021 17:13       Medical Problem List and Plan: 1.  Cervical myelopathy  -patient may shower but incision must be covered  -ELOS/Goals: 10-14 days modI  Admit to CIR 2.  Antithrombotics: -DVT/anticoagulation:  Pharmaceutical: Heparin--low dose. Was on Eliquis PTA--held due to surgery--> to resume on 09/23 per recs.  -antiplatelet therapy: N/A 3. Endstate OA left hip/Pain  Management: Oxycodone prn  --will add voltaren gel to left hip QID  -pain worsening, will get repeat XR today- he would like a joint injection 4. Mood: LCSW to follow for evaluation and support.   -antipsychotic agents: N/A 5. Neuropsych: This patient is capable of making decisions on his own behalf. 6. Skin/Wound Care:  Routine pressure relief measures--monitor incision for healing.   --D/c bacitracin ointemtn--add Aquacell with dry dressing to bilateral knee abrasions.  7. Fluids/Electrolytes/Nutrition: Monitor I/O.  --will order post op CMET in am 8. CAF: Monitor HR TID--continue to hold eliquis till 09/23  --On coreg BID.  9.  Dehydration: Encourage fluid intake. Monitor orthostatic symptoms 10. Hypoxia/ Productive cough: Will down grade diet to dysphagia 3 with aspiration precautions/supervison  --has been cleared to remove collar for meals.   --CXR ordered to rule  out aspiration event: shows atelectasis vs. Infection: will monitor fever and check CBC tomorrow 11. T2DM diet controlled?: Has been off meds for 5 years.  --will order CBGs for monitoring, Check A1C in am. CBGS 87-144: provide dietary education. 12. HTN: Monitor BP TID--continue Lasix and Cozaar. BP currently soft but as expected for SCI 13. Anemia: Pernicious? W/MCV 104. Will check vitamin B 12 levels --Post op CBC ordered for tomorrow.  14. OSA: Does not use CPAP. 15. Constipation: Has not had BM for a couple of days  --will add Senna S.  16. Overweight BMI 28.82: provide dietary education  I have personally performed a face to face diagnostic evaluation, including, but not limited to relevant history and physical exam findings, of this patient and developed relevant assessment and plan.  Additionally, I have reviewed and concur with the physician assistant's documentation above.  Leeroy Cha, MD  Bary Leriche, PA-C

## 2021-01-03 NOTE — Plan of Care (Signed)

## 2021-01-03 NOTE — Discharge Summary (Signed)
Physician Discharge Summary  Patient ID: Logan [name] MRN: [id number] DOB/AGE: [birth year] 75 y.o.  Admit date: 01/01/2021 Discharge date: 01/03/2021  Admission Diagnoses: Cervical spondylosis with myelopathy due to severe stenosis and degenerative spondylolisthesis at C3-4  Discharge Diagnoses: Cervical spondylosis with myelopathy due to severe stenosis and degenerative spondylolisthesis at C3-4 Active Problems:   Cervical myelopathy [facility])   Discharged Condition: good  Hospital Course: The patient was admitted on 01/01/2021 and taken to the operating room where the patient underwent C3/4 ACDF. The patient tolerated the procedure well and was taken to the recovery room and then to the floor in stable condition. The hospital course was routine. There were no complications. The wound remained clean dry and intact. Pt had appropriate upper back/neck soreness. No complaints of arm pain or new N/T/W. The patient remained afebrile with stable vital signs, and tolerated a regular diet. The patient continued to increase activities, and pain was well controlled with oral pain medications.   Consults: None  Significant Diagnostic Studies: radiology: X-Ray: intraoperative   Treatments: surgery:  1. Arthrodesis C3-4, anterior interbody technique  2. Placement of intervertebral biomechanical device C3-4, K2M [place] 7 mm interbody 3. Placement of anterior instrumentation consisting of interbody plate and screws -K2 M [place] plate, 16 mm screws 4. Discectomy at C3-4 for decompression of spinal cord and exiting nerve roots  5. Use of morselized bone allograft  6.  Use of autograft, same incision 7. Use of intraoperative microscope 8.  Intraoperative use of neuro monitoring, SSEPs  Discharge Exam: Blood pressure (!) 100/49, pulse 67, temperature 98 F (36.7 C), temperature source Oral, resp. rate 20, height '5\' 7"'$  (1.702 m), weight 83.5 kg, SpO2 96 %.  Physical Exam: Patient is awake, A/O X 4,  conversant, and in good spirits. They are in NAD and VSS. Doing well. Speech is fluent and appropriate. Right deltoid 2/5, Left deltoid 3/5, Bi, Tri, grip 3/5. BLE 2/5 throughout. PERLA, EOMI. CNs grossly intact. Dressing is dry and intact with a small amount of old drainage along the medial edge. Incision is well approximated with no drainage, erythema, or edema. Hard cervical collar in place.  Disposition: Discharge disposition: [facility]-Another [facility] Care Institution Not Defined      Discharge Instructions     Incentive spirometry RT   Complete by: As directed    Incentive spirometry RT   Complete by: As directed       Allergies as of 01/03/2021   No Known Allergies      Medication List     TAKE these medications    acetaminophen 500 MG tablet Commonly known as: TYLENOL Take 1,000 mg by mouth every 6 (six) hours as needed for mild pain.   allopurinol 300 MG tablet Commonly known as: ZYLOPRIM Take 300 mg by mouth daily.   carvedilol 12.5 MG tablet Commonly known as: COREG Take 12.5 mg by mouth 2 (two) times daily with a meal.   DHEA 25 MG Caps Take 50 mg by mouth daily.   Eliquis 5 MG Tabs tablet Generic drug: apixaban Take 1 tablet (5 mg total) by mouth 2 (two) times daily.   fluticasone 50 MCG/ACT nasal spray Commonly known as: FLONASE Place 1 spray into both nostrils daily as needed for rhinitis.   furosemide 40 MG tablet Commonly known as: LASIX Take 1 tablet (40 mg total) by mouth daily. What changed: when to take this   ketoconazole 2 % cream Commonly known as: NIZORAL Apply 1 application topically daily as needed for  irritation.   loratadine 10 MG tablet Commonly known as: CLARITIN Take 10 mg by mouth daily as needed for allergies.   losartan 100 MG tablet Commonly known as: COZAAR Take 100 mg by mouth daily.   multivitamin tablet Take 1 tablet by mouth daily.   omeprazole 20 MG capsule Commonly known as: PRILOSEC Take 20 mg by mouth daily as  needed (for heartburn).   potassium chloride 10 MEQ CR capsule Commonly known as: MICRO-K Take 10 mEq by mouth daily.   traMADol 50 MG tablet Commonly known as: ULTRAM Take 50 mg by mouth every 12 (twelve) hours as needed for moderate pain.       Continue to hold Eliquis. Plan to restart Eliquis on 01/11/21.    Signed: Marvis Moeller, DNP, NP-C 01/03/2021, 4:50 PM

## 2021-01-03 NOTE — Progress Notes (Addendum)
Discussed AC with Dr. Acey Lav to increase heparin to every 8 hours and to resume Eliquis on POD# 10. CXR with LLL atelectasis--flutter valve added and need to encourage use. Aspiration precautions and await CBC/monitor for fevers.

## 2021-01-03 NOTE — Progress Notes (Signed)
PT Cancellation Note  Patient Details Name: Logan [name] MRN: [id number] DOB: [birth year]   Cancelled Treatment:    Reason Eval/Treat Not Completed: Patient at procedure or test/unavailable Patient being taken down for X-ray. PT will re-attempt as time allows.  Blaine Hari A. Gilford Rile PT, DPT Acute Rehabilitation Services Pager [phone] Office [phone]    Linna Hoff 01/03/2021, 2:45 PM

## 2021-01-03 NOTE — Progress Notes (Signed)
Inpatient Rehab Admissions Coordinator:    I have insurance approval and a bed available for pt to admit to CIR today. Dr. Reatha Armour in agreement.  Will let pt/family and TOC team know.   Shann Medal, PT, DPT Admissions Coordinator [phone] 01/03/21  10:23 AM

## 2021-01-03 NOTE — Progress Notes (Signed)
PMR Admission Coordinator Pre-Admission Assessment   Patient: Logan [name] is an 75 y.o., male MRN: [id number] DOB: [birth year] Height: 5' 7"  (170.2 cm) Weight: 83.5 kg   Insurance Information HMO:   yes   PPO:      PCP:      IPA:      80/20:      OTHER:  PRIMARY: [employer]      Policy#: [id number]      Subscriber:  CM Name: Logan [name]      Phone#: [phone] ext [phone]     Fax#: [phone] Pre-Cert#: [id number] auth for CIR given by Logan [name] at [facility] with updates due to fax listed above on 9/21.  F/U case manager is Logan [name], phone [phone] ext [phone]      Employer:  Benefits:  Phone #: [phone]     Name:  Logan [name]. Date: 04/22/19     Deduct: $0      Out of Pocket Max: $3900 (met $1082.54)      Life Max:  CIR: $295/day for days 1-6      SNF: 20 full days Outpatient:      Co-Pay: $10-$20/visit Home Health: 100%      Co-Pay:  DME: 80%     Co-Ins: 20% Providers:  SECONDARY: Medicaid [place]      Policy#: [id number]     Phone#:    Financial Counselor: Phone#:    The "Data Collection Information Summary" for patients in Inpatient Rehabilitation Facilities with attached "Privacy Act [place] Records" was provided and verbally reviewed with: Patient and Family   Emergency Contact Information Contact Information       Name Relation Home Work Logan [name],Logan [name] Son [phone]               Current Medical History  Patient Admitting Diagnosis: cervical myelopathy   History of Present Illness: Pt is a 75 y/o male with a history of A-fib (on eliquis), heart failure, DM, HTN, and OSA who has had progressive cervical myelopathy with significant UE/LE weakness.  He has progressed from using a rollator to requiring a w/c in the community.  N/T in B hand and feet.  MRI revealed severe stenosis at C3-4 degenerative spondylolisthesis.  He presented to [facility] on 9/13 for surgical intervention for cervical myelopathy/radiculopathy.  Post operatively pt with ongoing  UE>LE weakness.  Eliquis on hold with plan to restart on 9/23 per cardiology.  Post op course pain management.  Therapy evaluations were completed and pt was recommended for CIR.    Patient's medical record from [facility] has been reviewed by the rehabilitation admission coordinator and physician.   Past Medical History      Past Medical History:  Diagnosis Date   Anxiety     Atherosclerotic renal artery stenosis, unilateral ([place]) 02/28/2014   Atrial fibrillation (HCC)     Bilateral lower extremity edema     Chronic diastolic heart failure ([place]) 10/21/2019   Diabetes mellitus without complication (HCC)     DVT (deep venous thrombosis) (HCC)     Esophageal reflux     Essential hypertension 10/21/2019   Gout     Hyperlipidemia     Hypertension     Mixed hyperlipidemia 10/21/2019   Morbid obesity ([place])     Obesity (BMI 30-39.9) 08/24/2019   Orthopnea     Osteoarthritis     Persistent atrial fibrillation ([place]) 10/21/2019   Seasonal allergies     Sleep apnea  Has the patient had major surgery during 100 days prior to admission? Yes   Family History   family history includes Atrial fibrillation in his brother; Cancer in his father; Heart disease in his brother and mother.   Current Medications   Current Facility-Administered Medications:    0.9 %  sodium chloride infusion, 250 mL, Intravenous, Continuous, Dawley, Troy C, DO   acetaminophen (TYLENOL) tablet 650 mg, 650 mg, Oral, Q4H PRN **OR** acetaminophen (TYLENOL) suppository 650 mg, 650 mg, Rectal, Q4H PRN, Dawley, Troy C, DO   alum & mag hydroxide-simeth (MAALOX/MYLANTA) 200-200-20 MG/5ML suspension 30 mL, 30 mL, Oral, Q6H PRN, Dawley, Troy C, DO   bacitracin ointment, , Topical, BID, Dawley, Troy C, DO, Given at 01/03/21 0843   carvedilol (COREG) tablet 12.5 mg, 12.5 mg, Oral, BID WC, Dawley, Troy C, DO, 12.5 mg at 01/03/21 0843   fluticasone (FLONASE) 50 MCG/ACT nasal spray 1 spray, 1 spray, Each Nare, Daily PRN, Dawley, Troy  C, DO   furosemide (LASIX) tablet 40 mg, 40 mg, Oral, Daily, Dawley, Troy C, DO, 40 mg at 01/03/21 0844   heparin injection 5,000 Units, 5,000 Units, Subcutaneous, Q12H, Dawley, Troy C, DO, 5,000 Units at 01/03/21 0844   HYDROmorphone (DILAUDID) injection 0.5 mg, 0.5 mg, Intravenous, Q3H PRN, Dawley, Troy C, DO   influenza vaccine adjuvanted (FLUAD) injection 0.5 mL, 0.5 mL, Intramuscular, Tomorrow-1000, Dawley, Troy C, DO   lactated ringers infusion, , Intravenous, Continuous, Dawley, Troy C, DO, Stopped at 01/01/21 2340   loratadine (CLARITIN) tablet 10 mg, 10 mg, Oral, Daily PRN, Dawley, Troy C, DO   losartan (COZAAR) tablet 100 mg, 100 mg, Oral, Daily, Dawley, Troy C, DO, 100 mg at 01/03/21 0846   menthol-cetylpyridinium (CEPACOL) lozenge 3 mg, 1 lozenge, Oral, PRN **OR** phenol (CHLORASEPTIC) mouth spray 1 spray, 1 spray, Mouth/Throat, PRN, Dawley, Troy C, DO   ondansetron (ZOFRAN) tablet 4 mg, 4 mg, Oral, Q6H PRN **OR** ondansetron (ZOFRAN) injection 4 mg, 4 mg, Intravenous, Q6H PRN, Dawley, Troy C, DO   pantoprazole (PROTONIX) EC tablet 40 mg, 40 mg, Oral, Daily, Dawley, Troy C, DO, 40 mg at 01/03/21 0843   potassium chloride (KLOR-CON) CR tablet 10 mEq, 10 mEq, Oral, Daily, Dawley, Troy C, DO, 10 mEq at 01/03/21 0843   senna-docusate (Senokot-S) tablet 1 tablet, 1 tablet, Oral, QHS PRN, Dawley, Troy C, DO   sodium chloride flush (NS) 0.9 % injection 3 mL, 3 mL, Intravenous, Q12H, Dawley, Troy C, DO, 3 mL at 01/03/21 0844   sodium chloride flush (NS) 0.9 % injection 3 mL, 3 mL, Intravenous, PRN, Dawley, Troy C, DO   Patients Current Diet:  Diet Order                  Diet regular Room service appropriate? Yes with Assist; Fluid consistency: Thin  Diet effective now                         Precautions / Restrictions Precautions Precautions: Fall, Cervical Precaution Booklet Issued: Yes (comment) Precaution Comments: pt with verbal understanding Cervical Brace: Hard collar, At  all times    Has the patient had 2 or more falls or a fall with injury in the past year? Yes   Prior Activity Level Household: mostly household ambulation with rollator, using a w/c for the last month or so to get to appointments, states he was driving until about 3 weeks ago.   Prior Functional Level Self Care: Did  the patient need help bathing, dressing, using the toilet or eating? Independent   Indoor Mobility: Did the patient need assistance with walking from room to room (with or without device)? Independent   Stairs: Did the patient need assistance with internal or external stairs (with or without device)? Dependent   Functional Cognition: Did the patient need help planning regular tasks such as shopping or remembering to take medications? Independent   Patient Information Are you of Hispanic, Latino/a,or Spanish origin?: A. No, not of Hispanic, Latino/a, or Spanish origin What is your race?: A. White Do you need or want an interpreter to communicate with a doctor or health care staff?: 0. No   Patient's Response To:  Health Literacy and Transportation Is the patient able to respond to health literacy and transportation needs?: Yes Health Literacy - How often do you need to have someone help you when you read instructions, pamphlets, or other written material from your doctor or pharmacy?: Never In the past 12 months, has lack of transportation kept you from medical appointments or from getting medications?: No In the past 12 months, has lack of transportation kept you from meetings, work, or from getting things needed for daily living?: No   Home Assistive Devices / [place] Devices/Equipment: Environmental consultant (specify type), Eyeglasses Home Equipment: Walker - 4 wheels   Prior Device Use: Indicate devices/aids used by the patient prior to current illness, exacerbation or injury? Manual wheelchair and Walker   Current Functional Level Cognition   Overall Cognitive  Status: No family/caregiver present to determine baseline cognitive functioning Orientation Level: Oriented X4 Safety/Judgement: Decreased awareness of safety, Decreased awareness of deficits General Comments: pt pleasant and eager to get better and go home, pt follows commands appropriately and is not impulsive    Extremity Assessment (includes Sensation/Coordination)   Upper Extremity Assessment: Defer to OT evaluation RUE Deficits / Details: decreased ROM, no shoulder shrug, decreased sensation and fine motor right is worse than left RUE Sensation: decreased light touch (improved since sx, but still present) RUE Coordination: decreased fine motor, decreased gross motor LUE Deficits / Details: decreased ROM, decreased shoulder shrug, decreased sensation and fine motor left has better shoulder ROM than right LUE Sensation: decreased light touch (improved since sx, but still present) LUE Coordination: decreased fine motor, decreased gross motor  Lower Extremity Assessment: Generalized weakness (pt's R LE longer than the R, grossly 4-/5 strength t/o, reports some numbness in bilat feet but not as bad as it was before surgery)     ADLs   Overall ADL's : Needs assistance/impaired Eating/Feeding: Minimal assistance, Sitting, With adaptive utensils Eating/Feeding Details (indicate cue type and reason): assist to open containers, apply condiments, decreased shoulder ROM and trouble bringing food to mouth due to decrease ROM and sensatoin. Pt provided with built up handles (washcloth taped around utensil) which he reports helps. often Pt has to use BUE to bring food/drink to mouth Grooming: Wash/dry face, Moderate assistance, Sitting Grooming Details (indicate cue type and reason): EOB, assist to support RUE at elbow for Pt to be able to reach face Upper Body Bathing: Moderate assistance Lower Body Bathing: Maximal assistance Upper Body Dressing : Maximal assistance, Sitting Upper Body Dressing  Details (indicate cue type and reason): to change gown Lower Body Dressing: Maximal assistance Toilet Transfer: Moderate assistance, +2 for physical assistance, +2 for safety/equipment, Ambulation, RW Toilet Transfer Details (indicate cue type and reason): mod A for initial boost into standing with vc for safe hand placement Toileting- Clothing  Manipulation and Hygiene: Maximal assistance, Sit to/from stand, Min guard, Sitting/lateral lean Toileting - Clothing Manipulation Details (indicate cue type and reason): initially performing incomplete peri care leaning side to side (unsafely requiring assit) and then max A in standing for finishing peri care Functional mobility during ADLs: Moderate assistance, +2 for physical assistance, +2 for safety/equipment, Rolling walker, Cueing for safety General ADL Comments: decreased functional use of BUE, decreased balance, decreased strength     Mobility   Overal bed mobility: Needs Assistance Bed Mobility: Rolling, Sidelying to Sit Rolling: Min assist Sidelying to sit: Mod assist, +2 for safety/equipment, HOB elevated General bed mobility comments: assist for trunk elevation, BLE off the bed, and cues for sequencing     Transfers   Overall transfer level: Needs assistance Equipment used: Rolling walker (2 wheeled) Transfers: Sit to/from Stand, [employer] Transfers Sit to Stand: Mod assist, +2 physical assistance, +2 safety/equipment, From elevated surface Stand pivot transfers: Mod assist, +2 safety/equipment General transfer comment: vc for safe hand placement, mod A +2 to power into standing and for initial balance assist, berbal cues for hand placement, pt initially with posterior lean/retropulsion     Ambulation / Gait / Stairs / Wheelchair Mobility   Ambulation/Gait Ambulation/Gait assistance: Min assist, Mod assist, +2 safety/equipment Gait Distance (Feet): 30 Feet Assistive device: Rolling walker (2 wheeled) Gait Pattern/deviations:  Step-through pattern, Decreased stride length, Narrow base of support General Gait Details: pt's R LE longer than L therefore pt amb with a limp, pt with decreased endurance, fatigued quickly, minA for management of RW during turns, pt did walker backwards about 2 feet to turn around at window in room Gait velocity: dec     Posture / Balance Dynamic Sitting Balance Sitting balance - Comments: pt unable to reach feet, min guard for balance, pt with significiant lean backwards to perform pericare Balance Overall balance assessment: Needs assistance, History of Falls Sitting-balance support: Bilateral upper extremity supported, Feet supported Sitting balance-Leahy Scale: Fair Sitting balance - Comments: pt unable to reach feet, min guard for balance, pt with significiant lean backwards to perform pericare Standing balance support: Bilateral upper extremity supported Standing balance-Leahy Scale: Poor Standing balance comment: dependent on BUE in standing for balance     Special needs/care consideration Skin surgical incision to anterior neck    Previous Home Environment (from acute therapy documentation) Living Arrangements: Alone Available Help at Discharge: Family, Available PRN/intermittently Type of Home: Mobile home Home Layout: One level Home Access: Ramped entrance Bathroom Shower/Tub: Chiropodist: Standard Bathroom Accessibility: No Home Care Services: No Additional Comments: Sons both work and have families   Discharge Living Setting Plans for Discharge Living Setting: Patient's home Type of Home at Discharge: Mobile home Discharge Home Layout: One level Discharge Home Access: Ramped entrance Discharge Bathroom Shower/Tub: Tub/shower unit Discharge Bathroom Toilet: Standard Discharge Bathroom Accessibility: No Does the patient have any problems obtaining your medications?: No   Social/Family/Support Systems Anticipated Caregiver: son, Edwards Mckelvie Anticipated Caregiver's Contact Information: [phone] Ability/Limitations of Caregiver: supervision Caregiver Availability: 24/7 Discharge Plan Discussed with Primary Caregiver: Yes Is Caregiver In Agreement with Plan?: Yes Does Caregiver/Family have Issues with Lodging/Transportation while Pt is in Rehab?: No   Goals Patient/Family Goal for Rehab: PT/OT supervision, SLP n/a Expected length of stay: 14-16 days Additional Information: discussed need for initial 24/7 with pt's son, Jamarques Pt/Family Agrees to Admission and willing to participate: Yes Program Orientation Provided & Reviewed with Pt/Caregiver Including Roles  & Responsibilities: Yes  Barriers to Discharge: Insurance for SNF coverage   Decrease burden of Care through IP rehab admission: n/a   Possible need for SNF placement upon discharge: Not anticipated.   Patient Condition: I have reviewed medical records from [facility], spoken with CM, and patient and son. I met with patient at the bedside and discussed via phone for inpatient rehabilitation assessment.  Patient will benefit from ongoing PT and OT, can actively participate in 3 hours of therapy a day 5 days of the week, and can make measurable gains during the admission.  Patient will also benefit from the coordinated team approach during an Inpatient Acute Rehabilitation admission.  The patient will receive intensive therapy as well as Rehabilitation physician, nursing, social worker, and care management interventions.  Due to bladder management, bowel management, safety, skin/wound care, disease management, medication administration, pain management, and patient education the patient requires 24 hour a day rehabilitation nursing.  The patient is currently min to mod assist with mobility and basic ADLs.  Discharge setting and therapy post discharge at home with home health is anticipated.  Patient has agreed to participate in the Acute Inpatient Rehabilitation  Program and will admit today.   Preadmission Screen Completed By:  Logan [name], PT, DPT 01/03/2021 11:24 AM ______________________________________________________________________   Discussed status with Dr. Ranell Patrick on 01/03/21  at 11:25 AM  and received approval for admission today.   Admission Coordinator:  Logan [name], PT, DPT 11:25 AM Sudie Grumbling 01/03/21     Assessment/Plan: Diagnosis: Cervical myelopathy Does the need for close, 24 hr/day Medical supervision in concert with the patient's rehab needs make it unreasonable for this patient to be served in a less intensive setting? Yes Co-Morbidities requiring supervision/potential complications: unilateral renal artery stenosis, sleep apnea, OA, HTN, HLD, gout Due to bladder management, bowel management, safety, skin/wound care, disease management, medication administration, pain management, and patient education, does the patient require 24 hr/day rehab nursing? Yes Does the patient require coordinated care of a physician, rehab nurse, PT, OT to address physical and functional deficits in the context of the above medical diagnosis(es)? Yes Addressing deficits in the following areas: balance, endurance, locomotion, strength, transferring, bowel/bladder control, bathing, dressing, feeding, grooming, toileting, and psychosocial support Can the patient actively participate in an intensive therapy program of at least 3 hrs of therapy 5 days a week? Yes The potential for patient to make measurable gains while on inpatient rehab is excellent Anticipated functional outcomes upon discharge from inpatient rehab: modified independent PT, modified independent OT, independent SLP Estimated rehab length of stay to reach the above functional goals is: 10-14 days Anticipated discharge destination: Home 10. Overall Rehab/Functional Prognosis: excellent     MD Signature: Leeroy Cha, MD

## 2021-01-03 NOTE — Progress Notes (Signed)
Inpatient Rehabilitation Medication Review by a Pharmacist  A complete drug regimen review was completed for this patient to identify any potential clinically significant medication issues.  High Risk Drug Classes Is patient taking? Indication by Medication  Antipsychotic Yes Prochlorperazine: PRN nausea Trazodone: PRN insomnia  Anticoagulant Yes Heparin SQ: DVT ppx  Antibiotic Yes Bacitracin Oint: wound healing  Opioid Yes, as an intravenous medication Dilaudid: PRN pain  Antiplatelet No   Hypoglycemics/insulin Yes Insulin Aspart: hyperglycemia  Vasoactive Medication Yes Carvedalol: HTN/HF Losartan: HTN Furosemide: HTN/HF   Chemotherapy No   Other Yes Acetaminophen: PRN pain Maalox: PRN indigestion Diclofenac gel: pain Diphenhydramine: PRN itching Fluticasone: PRN rhinitis Robitussin DM: PRN cough Loradatine: PRN rhinitis Cepacol: PRN sore throat Ondansetron: PRN nausea Pantoprazole: GERD ppx Chloraseptic: PRN sore throat Senokot-S: PRN constipation ppx Fleet enema: PRN constipation ppx     Type of Medication Issue Identified Description of Issue Recommendation(s)  Drug Interaction(s) (clinically significant)     Duplicate Therapy     Allergy     No Medication Administration End Date     Incorrect Dose     Additional Drug Therapy Needed  PTA allopurinol '300mg'$  daily not reordered Resume allopurinol as appropriate  Significant med changes from prior encounter (inform family/care partners about these prior to discharge).    Other       Clinically significant medication issues were identified that warrant physician communication and completion of prescribed/recommended actions by midnight of the next day:  Yes  Name of provider notified for urgent issues identified: AM Rph to follow-up, not urgent medication addition   Pharmacist comments: left message to AM pharmacist to follow-up resuming PTA allopurinol    Time spent performing this drug regimen review  (minutes):  15 mins   Thank you for allowing pharmacy to be a part of this patient's care.  Ardyth Harps, PharmD Clinical Pharmacist

## 2021-01-04 DIAGNOSIS — D539 Nutritional anemia, unspecified: Secondary | ICD-10-CM

## 2021-01-04 DIAGNOSIS — E876 Hypokalemia: Secondary | ICD-10-CM

## 2021-01-04 DIAGNOSIS — E46 Unspecified protein-calorie malnutrition: Secondary | ICD-10-CM

## 2021-01-04 DIAGNOSIS — R0902 Hypoxemia: Secondary | ICD-10-CM

## 2021-01-04 DIAGNOSIS — M5000 Cervical disc disorder with myelopathy, unspecified cervical region: Secondary | ICD-10-CM

## 2021-01-04 DIAGNOSIS — E8809 Other disorders of plasma-protein metabolism, not elsewhere classified: Secondary | ICD-10-CM | POA: Diagnosis not present

## 2021-01-04 DIAGNOSIS — Z9981 Dependence on supplemental oxygen: Secondary | ICD-10-CM

## 2021-01-04 DIAGNOSIS — R7303 Prediabetes: Secondary | ICD-10-CM

## 2021-01-04 DIAGNOSIS — I10 Essential (primary) hypertension: Secondary | ICD-10-CM

## 2021-01-04 LAB — CBC WITH DIFFERENTIAL/PLATELET
Abs Immature Granulocytes: 0.08 10*3/uL — ABNORMAL HIGH (ref 0.00–0.07)
Basophils Absolute: 0.1 10*3/uL (ref 0.0–0.1)
Basophils Relative: 1 %
Eosinophils Absolute: 0.2 10*3/uL (ref 0.0–0.5)
Eosinophils Relative: 2 %
HCT: 35.7 % — ABNORMAL LOW (ref 39.0–52.0)
Hemoglobin: 11.5 g/dL — ABNORMAL LOW (ref 13.0–17.0)
Immature Granulocytes: 1 %
Lymphocytes Relative: 19 %
Lymphs Abs: 2 10*3/uL (ref 0.7–4.0)
MCH: 34 pg (ref 26.0–34.0)
MCHC: 32.2 g/dL (ref 30.0–36.0)
MCV: 105.6 fL — ABNORMAL HIGH (ref 80.0–100.0)
Monocytes Absolute: 0.9 10*3/uL (ref 0.1–1.0)
Monocytes Relative: 9 %
Neutro Abs: 6.9 10*3/uL (ref 1.7–7.7)
Neutrophils Relative %: 68 %
Platelets: 248 10*3/uL (ref 150–400)
RBC: 3.38 MIL/uL — ABNORMAL LOW (ref 4.22–5.81)
RDW: 14.5 % (ref 11.5–15.5)
WBC: 10 10*3/uL (ref 4.0–10.5)
nRBC: 0 % (ref 0.0–0.2)

## 2021-01-04 LAB — HEMOGLOBIN A1C
Hgb A1c MFr Bld: 5.7 % — ABNORMAL HIGH (ref 4.8–5.6)
Mean Plasma Glucose: 116.89 mg/dL

## 2021-01-04 LAB — COMPREHENSIVE METABOLIC PANEL
ALT: 19 U/L (ref 0–44)
AST: 19 U/L (ref 15–41)
Albumin: 2.6 g/dL — ABNORMAL LOW (ref 3.5–5.0)
Alkaline Phosphatase: 68 U/L (ref 38–126)
Anion gap: 11 (ref 5–15)
BUN: 12 mg/dL (ref 8–23)
CO2: 35 mmol/L — ABNORMAL HIGH (ref 22–32)
Calcium: 9.2 mg/dL (ref 8.9–10.3)
Chloride: 98 mmol/L (ref 98–111)
Creatinine, Ser: 0.73 mg/dL (ref 0.61–1.24)
GFR, Estimated: >60 mL/min (ref >60)
Glucose, Bld: 91 mg/dL (ref 70–99)
Potassium: 3.2 mmol/L — ABNORMAL LOW (ref 3.5–5.1)
Sodium: 144 mmol/L (ref 135–145)
Total Bilirubin: 0.7 mg/dL (ref 0.3–1.2)
Total Protein: 5.4 g/dL — ABNORMAL LOW (ref 6.5–8.1)

## 2021-01-04 LAB — FOLATE: Folate: 17.5 ng/mL (ref >5.9)

## 2021-01-04 LAB — GLUCOSE, CAPILLARY
Glucose-Capillary: 166 mg/dL — ABNORMAL HIGH (ref 70–99)
Glucose-Capillary: 104 mg/dL — ABNORMAL HIGH (ref 70–99)
Glucose-Capillary: 148 mg/dL — ABNORMAL HIGH (ref 70–99)
Glucose-Capillary: 90 mg/dL (ref 70–99)

## 2021-01-04 MED ORDER — POTASSIUM CHLORIDE CRYS ER 20 MEQ PO TBCR
20.0000 meq | EXTENDED_RELEASE_TABLET | Freq: Two times a day (BID) | ORAL | Status: DC
  Administered 2021-01-04 – 2021-01-30 (×52): 20 meq via ORAL
  Filled 2021-01-04 (×52): qty 1

## 2021-01-04 MED ORDER — POLYETHYLENE GLYCOL 3350 17 G PO PACK
17.0000 g | PACK | Freq: Two times a day (BID) | ORAL | Status: DC
  Administered 2021-01-04 – 2021-01-17 (×11): 17 g via ORAL
  Filled 2021-01-04 (×26): qty 1

## 2021-01-04 MED ORDER — PROSOURCE PLUS PO LIQD
30.0000 mL | Freq: Two times a day (BID) | ORAL | Status: DC
  Administered 2021-01-04 – 2021-01-28 (×21): 30 mL via ORAL
  Filled 2021-01-04 (×27): qty 30

## 2021-01-04 NOTE — Progress Notes (Signed)
Inpatient Rehabilitation  Patient information reviewed and entered into eRehab system by Derell Bruun M. Hosea Hanawalt, M.A., CCC/SLP, PPS Coordinator.  Information including medical coding, functional ability and quality indicators will be reviewed and updated through discharge.    

## 2021-01-04 NOTE — Progress Notes (Signed)
Patient transferred to 5C04 in stable condition. Report given to receiving nurse.  Patient attended therapy prior to transfer. Tolerated all morning medications. Condom cath appears patent with yellow, clear urine noted in bag.  Denies any pain or discomfort at time of transfer.

## 2021-01-04 NOTE — Evaluation (Signed)
Speech Language Pathology Assessment and Plan  Patient Details  Name: Logan [name] MRN: [id number] Date of Birth: [birth year]  SLP Diagnosis: Dysphagia  Rehab Potential: Good ELOS: 2-3 weeks for OT/PT (anticipate SLP will discharge early next week)    Today's Date: 01/04/2021 SLP Individual Time: 1130-1230 SLP Individual Time Calculation (min): 42 min   Hospital Problem: Principal Problem:   Cervical disc disease with myelopathy Active Problems:   Hypoxia   Supplemental oxygen dependent   Hypoalbuminemia due to protein-calorie malnutrition (HCC)   Hypokalemia   Macrocytic anemia   Prediabetes  Past Medical History:  Past Medical History:  Diagnosis Date   Anxiety    Atherosclerotic renal artery stenosis, unilateral ([place]) 02/28/2014   Atrial fibrillation (HCC)    Bilateral lower extremity edema    Chronic diastolic heart failure ([place]) 10/21/2019   Diabetes mellitus without complication (HCC)    DVT (deep venous thrombosis) ([place])    Esophageal reflux    Essential hypertension 10/21/2019   Gout    Hyperlipidemia    Hypertension    Mixed hyperlipidemia 10/21/2019   Morbid obesity ([place])    Obesity (BMI 30-39.9) 08/24/2019   Orthopnea    Osteoarthritis    Persistent atrial fibrillation ([place]) 10/21/2019   Seasonal allergies    Sleep apnea    Past Surgical History:  Past Surgical History:  Procedure Laterality Date   ABDOMINAL AORTAGRAM N/A 02/28/2014   Procedure: ABDOMINAL AORTAGRAM;  Surgeon: Serafina Mitchell, MD;  Location: [facility] CATH LAB;  Service: Cardiovascular;  Laterality: N/A;   ANTERIOR CERVICAL DECOMP/DISCECTOMY FUSION N/A 01/01/2021   Procedure: ACDF C3-4;  Surgeon: Dawley, Theodoro Doing, DO;  Location: [place];  Service: Neurosurgery;  Laterality: N/A;   APPENDECTOMY     ATRIAL FIBRILLATION ABLATION N/A 02/29/2020   Procedure: ATRIAL FIBRILLATION ABLATION;  Surgeon: Constance Haw, MD;  Location: [place] CV LAB;  Service: Cardiovascular;  Laterality: N/A;    CARDIOVERSION N/A 10/18/2020   Procedure: CARDIOVERSION (CATH LAB);  Surgeon: Constance Haw, MD;  Location: [place] CV LAB;  Service: Cardiovascular;  Laterality: N/A;   CARPAL TUNNEL RELEASE Left     Assessment / Plan / Recommendation  Logan [name] is a 75 year old male with history of HTN, A fib, OSA, DVT, chronic diastolic CHF, [id number] controlled who started developing weakness with numbness and tingling BLE for past 6 months which progressed to falls and inability to walk. He was essentially wheelchair bound for the past month with son checking in frequently during the day and assisting with meals. He has had multiple ED visits and on last in 07/22, was found to have cervical myelopathy C3/C4 due to severe canal stenosis with anterolisthesis C3 on C4. He was cleared for surgery by PCP/cardiology, Eliquis placed on hold and was admitted on 01/01/21 for ACDF C3-C4 by Dr. Reatha Armour. Post op reporting  improvement in sensation and strength of BUE, improvement in speech clarity as well as pain. He has developed cough with hypoxia as well as difficulty with meals felt to be due to intubation. CXR completed 9/15 due to questionable aspiration , revealing Left basilar consolidation, favor atelectasis given recent cervical spine surgery.CIR recommended due to functional decline. C/o cough and left hip pain. .  Patient transferred to CIR on 01/03/2021   Clinical Impr.ession Patient presents with a questionable mildly impaired pharyngeal dysphagia as a result of recent ACDF of C3-4. Speech, cognition and language function all appear WFL. (He does present with a heavy [place] accent which  can be difficult to understand at times however does not appear with a true dysarthria or speech impairment). SLP did not observe any overt s/s aspiration or penetration of thin liquids, Dys 3 solids or puree solids as patient consumed liquids via straw and ate solids during lunch meal. Voice remained clear and patient did  not complain of any difficulty swallowing. He did report that recently after surgery he had some discomfort in his throat, globus sensation and some coughing however he feels that these symptoms are resolving. SLP to follow briefly to ensure toleration, determine if able to advance to regular solids and determine if need for objective swallow study to r/o silent aspiration.  Skilled Therapeutic Interventions          BSE, SLE  SLP Assessment  Patient will need skilled Speech Lanaguage Pathology Services during CIR admission    Recommendations  SLP Diet Recommendations: Dysphagia 3 (Mech soft);Thin Liquid Administration via: Cup;Straw Medication Administration: Whole meds with puree Supervision: Patient able to self feed Compensations: Minimize environmental distractions;Slow rate;Small sips/bites Postural Changes and/or Swallow Maneuvers: Seated upright 90 degrees Oral Care Recommendations: Oral care BID Patient destination: Home Follow up Recommendations: None Equipment Recommended: None recommended by SLP    SLP Frequency 1 to 3 out of 7 days   SLP Duration  SLP Intensity  SLP Treatment/Interventions 2-3 weeks for OT/PT (anticipate SLP will discharge early next week)  Minumum of 1-2 x/day, 30 to 90 minutes  Patient/family education;Dysphagia/aspiration precaution training    Pain Pain Assessment Pain Scale: 0-10 Pain Score: 0-No pain  Prior Functioning Cognitive/Linguistic Baseline: Within functional limits Type of Home: Mobile home  Lives With: Alone Available Help at Discharge: Family;Available PRN/intermittently Vocation: Retired  SLP Evaluation Cognition Overall Cognitive Status: Within Functional Limits for tasks assessed Orientation Level: Oriented X4  Comprehension Auditory Comprehension Overall Auditory Comprehension: Appears within functional limits for tasks assessed Expression Expression Primary Mode of Expression: Verbal Verbal Expression Overall  Verbal Expression: Appears within functional limits for tasks assessed Oral Motor Oral Motor/Sensory Function Overall Oral Motor/Sensory Function: Within functional limits Motor Speech Overall Motor Speech: Appears within functional limits for tasks assessed Respiration: Within functional limits Articulation: Within functional limitis (Patient presents with a [place], country accent which can be difficult to understand at times however does not appear to be a deviation from his baseline speech pattern.)  Care Tool Care Tool Cognition Ability to hear (with hearing aid or hearing appliances if normally used Ability to hear (with hearing aid or hearing appliances if normally used): 1. Minimal difficulty - difficulty in some environments (e.g. when person speaks softly or setting is noisy)   Expression of Ideas and Wants Expression of Ideas and Wants: 3. Some difficulty - exhibits some difficulty with expressing needs and ideas (e.g, some words or finishing thoughts) or speech is not clear   Understanding Verbal and Non-Verbal Content Understanding Verbal and Non-Verbal Content: 4. Understands (complex and basic) - clear comprehension without cues or repetitions  Memory/Recall Ability Memory/Recall Ability : Current season;Location of own room;That he or she is in a hospital/hospital unit     Bedside Swallowing Assessment General Date of Onset: 01/03/21 Previous Swallow Assessment: None found Diet Prior to this Study: Thin liquids;Dysphagia 3 (soft) Temperature Spikes Noted: No Respiratory Status: Supplemental O2 delivered via (comment) History of Recent Intubation: Yes Length of Intubations (days):  (for procedure/surgery only) Date extubated: 01/01/21 Behavior/Cognition: Alert;Pleasant mood;Cooperative Oral Cavity - Dentition: Edentulous Self-Feeding Abilities: Able to feed self;Needs set up Patient Positioning:  Upright in chair/Tumbleform Baseline Vocal Quality: Normal Volitional  Cough: Strong Volitional Swallow: Able to elicit  Oral Care Assessment   Ice Chips Ice chips: Not tested Thin Liquid Thin Liquid: Within functional limits Presentation: Straw;Self Fed Nectar Thick   Honey Thick   Puree Puree: Within functional limits Presentation: Self Fed;Spoon Solid Solid: Within functional limits Presentation: Self Fed Other Comments: Patient able to consume Dys 3 solids without significant difficulty despite being edentulous BSE Assessment Suspected Esophageal Findings Suspected Esophageal Findings: Globus sensation Risk for Aspiration Impact on safety and function: No limitations;Mild aspiration risk Other Related Risk Factors: Other (comment) (recent ACDF of C3-4)  Short Term Goals: Week 1: SLP Short Term Goal 1 (Week 1): Patient will tolerate Dys 3 solids and trials of regular texture solids without overt s/s aspiration or penetration, with efficient mastication and oral transit and without patient c/o discomfort, with supervisionA. SLP Short Term Goal 2 (Week 1): SLP will determine if objective swallow study is warranted in light of patient's ACDF surgery and early c/o dysphagia.  Refer to Care Plan for Long Term Goals  Recommendations for other services: None   Discharge Criteria: Patient will be discharged from SLP if patient refuses treatment 3 consecutive times without medical reason, if treatment goals not met, if there is a change in medical status, if patient makes no progress towards goals or if patient is discharged from hospital.  The above assessment, treatment plan, treatment alternatives and goals were discussed and mutually agreed upon: by patient  Sonia Baller, MA, CCC-SLP Speech Therapy

## 2021-01-04 NOTE — Progress Notes (Signed)
Inpatient Rehabilitation Care Coordinator Assessment and Plan Patient Details  Name: Logan [name] MRN: [id number] Date of Birth: 10/29/1945  Today's Date: 01/04/2021  Hospital Problems: Principal Problem:   Cervical disc disease with myelopathy Active Problems:   Hypoxia   Supplemental oxygen dependent   Hypoalbuminemia due to protein-calorie malnutrition (HCC)   Hypokalemia   Macrocytic anemia   Prediabetes  Past Medical History:  Past Medical History:  Diagnosis Date   Anxiety    Atherosclerotic renal artery stenosis, unilateral ([place]) 02/28/2014   Atrial fibrillation (HCC)    Bilateral lower extremity edema    Chronic diastolic heart failure ([place]) 10/21/2019   Diabetes mellitus without complication (HCC)    DVT (deep venous thrombosis) (HCC)    Esophageal reflux    Essential hypertension 10/21/2019   Gout    Hyperlipidemia    Hypertension    Mixed hyperlipidemia 10/21/2019   Morbid obesity ([place])    Obesity (BMI 30-39.9) 08/24/2019   Orthopnea    Osteoarthritis    Persistent atrial fibrillation ([place]) 10/21/2019   Seasonal allergies    Sleep apnea    Past Surgical History:  Past Surgical History:  Procedure Laterality Date   ABDOMINAL AORTAGRAM N/A 02/28/2014   Procedure: ABDOMINAL AORTAGRAM;  Surgeon: Serafina Mitchell, MD;  Location: [facility] CATH LAB;  Service: Cardiovascular;  Laterality: N/A;   ANTERIOR CERVICAL DECOMP/DISCECTOMY FUSION N/A 01/01/2021   Procedure: ACDF C3-4;  Surgeon: Dawley, Theodoro Doing, DO;  Location: [place];  Service: Neurosurgery;  Laterality: N/A;   APPENDECTOMY     ATRIAL FIBRILLATION ABLATION N/A 02/29/2020   Procedure: ATRIAL FIBRILLATION ABLATION;  Surgeon: Constance Haw, MD;  Location: [place] CV LAB;  Service: Cardiovascular;  Laterality: N/A;   CARDIOVERSION N/A 10/18/2020   Procedure: CARDIOVERSION (CATH LAB);  Surgeon: Constance Haw, MD;  Location: [place] CV LAB;  Service: Cardiovascular;  Laterality: N/A;   CARPAL TUNNEL RELEASE  Left    Social History:  reports that he has quit smoking. He has quit using smokeless tobacco.  His smokeless tobacco use included chew. He reports that he does not drink alcohol and does not use drugs.  Family / Support Systems Marital Status: Widow/Widower Patient Roles: Parent Children: Yale Wilner [id number]  has another son who is involved also Other Supports: Friends Anticipated Caregiver: [place] Ability/Limitations of Caregiver: Both son's work Emilliano works Friday-Sun and his other son work s M-F Caregiver Availability: Intermittent Family Dynamics: Pt is close with both of his son's and they come by daily and check on him. Both work and have families of their own. Pt drove up until 3 weeks prior to admission and son's transported him  Social History Preferred language: English Religion: Baptist Cultural Background: No issues Education: [place] - How often do you need to have someone help you when you read instructions, pamphlets, or other written material from your doctor or pharmacy?: Never Writes: Yes Employment Status: Retired Date Retired/Disabled/Unemployed: Retired from [employer] Issues: No issues Guardian/Conservator: None-according to MD pt is capable of making his own decisions while here. His son's will visit when they can   Abuse/Neglect Abuse/Neglect Assessment Can Be Completed: Yes Physical Abuse: Denies Verbal Abuse: Denies Sexual Abuse: Denies Exploitation of patient/patient's resources: Denies Self-Neglect: Denies  Patient response to: Social Isolation - How often do you feel lonely or isolated from those around you?: Never  Emotional Status Pt's affect, behavior and adjustment status: Pt is motivated to do well and recover from this  surgery. He has always been independent and taken care of himself, he hopes to get back to this. He does not want to burden his son's. He is ready to work in [employer]. Recent  Psychosocial Issues: other health issues was managing prior to admission Psychiatric History: No history deferred depression screening due to coping appropriately and able to verbalize his concerns and feelings. Substance Abuse History: No issues  Patient / Family Perceptions, Expectations & Goals Pt/Family understanding of illness & functional limitations: Pt and son can explain his surgery, pt still wants some clarification regarding if surgeon replaced a disc or put rods in his back. He has spoken with him. Will ask nursing and PA to address his questions. Premorbid pt/family roles/activities: Father, retiree , friend, neighbor etc Anticipated changes in roles/activities/participation: resume Pt/family expectations/goals: Pt states: " I hope to be able to take care of myself and not burden my boys. I have always done on my own."  Son states: " I will help him but do work."  [employer]: None Premorbid Home Care/DME Agencies: Other (Comment) (has a rollator he was using PTA) Transportation available at discharge: pt drove up until 3 weeks prior to admission, his son's were transporting him to appointments after this Is the patient able to respond to transportation needs?: Yes In the past 12 months, has lack of transportation kept you from medical appointments or from getting medications?: No In the past 12 months, has lack of transportation kept you from meetings, work, or from getting things needed for daily living?: No  Discharge Planning Living Arrangements: Alone Support Systems: Children, Friends/neighbors Type of Residence: Private residence Insurance Resources: Multimedia programmer (specify) ([place]) Financial Resources: Social Security Financial Screen Referred: No Living Expenses: Own Money Management: Patient Does the patient have any problems obtaining your medications?: No Home Management: Self son's were helpoing when his back got so  bad Patient/Family Preliminary Plans: Return home with sons' checking on intermittently and providing transport to appointments and grocery store. He is hopeful he will regain his independence and can move around on his own by the time he leaves here. Care Coordinator Barriers to Discharge: Decreased caregiver support, Insurance for SNF coverage Care Coordinator Anticipated Follow Up Needs: HH/OP  Clinical Impression Pleasant gentleman who is willing to work hard in therapies to recover form his back surgery. He has always been independent and hopes to be again and not burden his two son's. Will await therapy evaluations and work on discharge needs.  Elease Hashimoto 01/04/2021, 11:17 AM

## 2021-01-04 NOTE — IPOC Note (Signed)
Individualized overall Plan of Care [facility]) Patient Details Name: Logan [name] MRN: [id number] DOB: [birth year]  Admitting Diagnosis: Cervical disc disease with myelopathy  Hospital Problems: Principal Problem:   Cervical disc disease with myelopathy Active Problems:   Hypoxia   Supplemental oxygen dependent   Hypoalbuminemia due to protein-calorie malnutrition (HCC)   Hypokalemia   Macrocytic anemia   Prediabetes     Functional Problem List: Nursing Bladder, Bowel, Edema, Endurance, Medication Management, Pain, Safety, Skin Integrity  PT Balance, Endurance, Motor, Safety, Sensory, Skin Integrity  OT Balance, Sensory, Skin Integrity, Endurance, Motor  SLP    TR         Basic ADL's: OT Eating, Grooming, Bathing, Dressing, Toileting     Advanced  ADL's: OT Simple Meal Preparation     Transfers: PT Bed Mobility, Bed to Chair, Car, Manufacturing systems engineer, Metallurgist: PT Ambulation, Emergency planning/management officer, Stairs     Additional Impairments: OT Fuctional Use of Upper Extremity  SLP Swallowing      TR      Anticipated Outcomes Item Anticipated Outcome  Self Feeding mod I  Swallowing  mod I   Basic self-care  mod I  Toileting  mod I   Bathroom Transfers mod I  Bowel/Bladder  Supervision  Transfers  mod I  Locomotion  Mod I household Immunologist  N/A  Cognition  N/A  Pain  < 3  Safety/Judgment  Supervision and no falls   Therapy Plan: PT Intensity: Minimum of 1-2 x/day ,45 to 90 minutes PT Frequency: 5 out of 7 days PT Duration Estimated Length of Stay: 18-21 days OT Intensity: Minimum of 1-2 x/day, 45 to 90 minutes OT Frequency: 5 out of 7 days OT Duration/Estimated Length of Stay: 2-3 weeks SLP Intensity: Minumum of 1-2 x/day, 30 to 90 minutes SLP Frequency: 1 to 3 out of 7 days SLP Duration/Estimated Length of Stay: 2-3 weeks for OT/PT (anticipate SLP will discharge early next week)    Team Interventions: Nursing  Interventions Patient/Family Education, Bladder Management, Bowel Management, Disease Management/Prevention, Pain Management, Medication Management, Skin Care/Wound Management, Discharge Planning  PT interventions Ambulation/gait training, Balance/vestibular training, Disease management/prevention, Functional mobility training, DME/adaptive equipment instruction, Neuromuscular re-education, Patient/family education, Psychosocial support, Skin care/wound management, Splinting/orthotics, Therapeutic Activities, Therapeutic Exercise, UE/LE Strength taining/ROM, UE/LE Coordination activities, Wheelchair propulsion/positioning  OT Interventions Balance/vestibular training, Discharge planning, Functional electrical stimulation, Self Care/advanced ADL retraining, Pain management, Therapeutic Activities, UE/LE Coordination activities, Disease mangement/prevention, Functional mobility training, Patient/family education, Skin care/wound managment, Therapeutic Exercise, DME/adaptive equipment instruction, Neuromuscular re-education, Splinting/orthotics, Psychosocial support, UE/LE Strength taining/ROM, Wheelchair propulsion/positioning  SLP Interventions Patient/family education, Dysphagia/aspiration precaution training  TR Interventions    SW/CM Interventions Discharge Planning, Psychosocial Support, Patient/Family Education   Barriers to Discharge MD  Medical stability and Wound care  Nursing Decreased caregiver support, Home environment access/layout, Incontinence, Wound Care, Lack of/limited family support, Weight, Weight bearing restrictions, Medication compliance Lives in mobile home with ramped entrance. Son can provide supervision assist at discharge 24/7.  PT Inaccessible home environment, Decreased caregiver support, Home environment access/layout, Incontinence, Lack of/limited family support sacral skin breakdown noted during eval, nursing notifies  OT Decreased caregiver support Per chart review, both  sons work and have families; unsure of amount of availiblity they will have to assist patient  SLP Decreased caregiver support patient lives alone  SW Decreased caregiver support, Insurance underwriter for SNF coverage     Team Discharge Planning: Destination: PT-Home ,OT- Home , SLP-Home Projected  Follow-up: PT-Home health PT, OT-  Home health OT, Outpatient OT, SLP-None Projected Equipment Needs: PT-To be determined, OT- To be determined, SLP-None recommended by SLP Equipment Details: PT-has rollator and 2WRW - states it is "too tall", OT-has a shower chair and rollator Patient/family involved in discharge planning: PT- Patient,  OT-Patient, SLP-Patient  MD ELOS: 16-19 days. Medical Rehab Prognosis:  Good Assessment: 75 year old male with history of HTN, A fib, OSA, DVT, chronic diastolic CHF, 0000000 controlled who started developing weakness with numbness and tingling BLE for past 6 months which progressed to falls and inability to walk. He was essentially wheelchair bound for the past month with son checking in frequently during the day and assisting with meals. He has had multiple ED visits and on last in 07/22, was found to have cervical myelopathy C3/C4 due to severe canal stenosis with anterolisthesis C3 on C4. He was cleared for surgery by PCP/cardiology, Eliquis placed on hold and was admitted on 01/01/21 for ACDF C3-C4 by Dr. Reatha Armour. Post op reporting  improvement in sensation and strength of BUE, improvement in speech clarity as well as pain. He has developed cough with hypoxia as well as difficulty with meals felt to be due to intubation. Therapy evaluations completed revealing difficulty with ADLs due to BUE weakness, balance deficits, Leg length discrepancy as well as decreased endurance.  We will set goals for Mod I with PT/OT/SLP.   Due to the current state of emergency, patients may not be receiving their 3-hours of Medicare-mandated therapy.  See Team Conference Notes for weekly updates  to the plan of care

## 2021-01-04 NOTE — Evaluation (Signed)
Occupational Therapy Assessment and Plan  Patient Details  Name: Logan [name] MRN: [id number] Date of Birth: [birth year]  OT Diagnosis: muscular wasting and disuse atrophy, muscle weakness (generalized), pain in joint, and quadriparesis at level C3-C4 Rehab Potential: Rehab Potential (ACUTE ONLY): Good ELOS: 2-3 weeks   Today's Date: 01/04/2021 OT Individual Time: 9678-9381 OT Individual Time Calculation (min): 70 min     Hospital Problem: Principal Problem:   Cervical disc disease with myelopathy Active Problems:   Hypoxia   Supplemental oxygen dependent   Hypoalbuminemia due to protein-calorie malnutrition (HCC)   Hypokalemia   Macrocytic anemia   Prediabetes   Past Medical History:  Past Medical History:  Diagnosis Date   Anxiety    Atherosclerotic renal artery stenosis, unilateral ([place]) 02/28/2014   Atrial fibrillation (HCC)    Bilateral lower extremity edema    Chronic diastolic heart failure ([place]) 10/21/2019   Diabetes mellitus without complication (HCC)    DVT (deep venous thrombosis) (HCC)    Esophageal reflux    Essential hypertension 10/21/2019   Gout    Hyperlipidemia    Hypertension    Mixed hyperlipidemia 10/21/2019   Morbid obesity ([place])    Obesity (BMI 30-39.9) 08/24/2019   Orthopnea    Osteoarthritis    Persistent atrial fibrillation ([place]) 10/21/2019   Seasonal allergies    Sleep apnea    Past Surgical History:  Past Surgical History:  Procedure Laterality Date   ABDOMINAL AORTAGRAM N/A 02/28/2014   Procedure: ABDOMINAL AORTAGRAM;  Surgeon: Serafina Mitchell, MD;  Location: [facility] CATH LAB;  Service: Cardiovascular;  Laterality: N/A;   ANTERIOR CERVICAL DECOMP/DISCECTOMY FUSION N/A 01/01/2021   Procedure: ACDF C3-4;  Surgeon: Dawley, Theodoro Doing, DO;  Location: [place];  Service: Neurosurgery;  Laterality: N/A;   APPENDECTOMY     ATRIAL FIBRILLATION ABLATION N/A 02/29/2020   Procedure: ATRIAL FIBRILLATION ABLATION;  Surgeon: Constance Haw, MD;  Location: [place] CV LAB;  Service: Cardiovascular;  Laterality: N/A;   CARDIOVERSION N/A 10/18/2020   Procedure: CARDIOVERSION (CATH LAB);  Surgeon: Constance Haw, MD;  Location: [place] CV LAB;  Service: Cardiovascular;  Laterality: N/A;   CARPAL TUNNEL RELEASE Left     Assessment & Plan Clinical Impression: Logan [name] is a 75 year old male with history of HTN, A fib, OSA, DVT, chronic diastolic CHF, [id number]-[id number] controlled who started developing weakness with numbness and tingling BLE for past 6 months which progressed to falls and inability to walk. He was essentially wheelchair bound for the past month with son checking in frequently during the day and assisting with meals. He has had multiple ED visits and on last in 07/22, was found to have cervical myelopathy C3/C4 due to severe canal stenosis with anterolisthesis C3 on C4. He was cleared for surgery by PCP/cardiology, Eliquis placed on hold and was admitted on 01/01/21 for ACDF C3-C4 by Dr. Reatha Armour. Post op reporting  improvement in sensation and strength of BUE, improvement in speech clarity as well as pain. He has developed cough with hypoxia as well as difficulty with meals felt to be due to intubation. Therapy evaluations completed revealing difficulty with ADLs due to BUE weakness, balance deficits, Leg length discrepancy as well as decreased endurance. CIR recommended due to functional decline. C/o cough and left hip pain. .  Patient transferred to CIR on 01/03/2021 .    Patient currently requires max with basic self-care skills secondary to muscle weakness and muscle paralysis, decreased cardiorespiratoy endurance and decreased oxygen support,  decreased coordination, and decreased sitting balance, decreased standing balance, decreased postural control, and decreased balance strategies.  Prior to hospitalization, patient could complete BADLs with modified independent .  Patient will benefit from skilled intervention to increase independence  with basic self-care skills prior to discharge home independently.  Anticipate patient will require intermittent supervision and follow up home health.  OT - End of Session Activity Tolerance: Tolerates 30+ min activity with multiple rests Endurance Deficit: Yes Endurance Deficit Description: Pt requires seated rest breaks throughout sinkside self care due to fatigue in standing OT Assessment Rehab Potential (ACUTE ONLY): Good OT Barriers to Discharge: Decreased caregiver support OT Barriers to Discharge Comments: Per chart review, both sons work and have families; unsure of amount of availiblity they will have to assist patient OT Patient demonstrates impairments in the following area(s): Balance;Sensory;Skin Integrity;Endurance;Motor OT Basic ADL's Functional Problem(s): Eating;Grooming;Bathing;Dressing;Toileting OT Advanced ADL's Functional Problem(s): Simple Meal Preparation OT Transfers Functional Problem(s): Toilet;Tub/Shower OT Additional Impairment(s): Fuctional Use of Upper Extremity OT Plan OT Intensity: Minimum of 1-2 x/day, 45 to 90 minutes OT Frequency: 5 out of 7 days OT Treatment/Interventions: Balance/vestibular training;Discharge planning;Functional electrical stimulation;Self Care/advanced ADL retraining;Pain management;Therapeutic Activities;UE/LE Coordination activities;Disease mangement/prevention;Functional mobility training;Patient/family education;Skin care/wound managment;Therapeutic Exercise;DME/adaptive equipment instruction;Neuromuscular re-education;Splinting/orthotics;Psychosocial support;UE/LE Strength taining/ROM;Wheelchair propulsion/positioning OT Self Feeding Anticipated Outcome(s): mod I OT Basic Self-Care Anticipated Outcome(s): mod I OT Toileting Anticipated Outcome(s): mod I OT Bathroom Transfers Anticipated Outcome(s): mod I OT Recommendation Patient destination: Home Follow Up Recommendations: Home health OT;Outpatient OT Equipment Recommended: To be  determined Equipment Details: has a shower chair and rollator   OT Evaluation Precautions/Restrictions  Precautions Precautions: Fall;Cervical Required Braces or Orthoses: Cervical Brace Cervical Brace: Hard collar;At all times;Other (comment) (except to self feed and bathe with head supported) General Chart Reviewed: Yes Pain Pain Assessment Pain Scale: 0-10 Pain Score: 0-No pain Home Living/Prior Functioning Home Living Family/patient expects to be discharged to:: Private residence Living Arrangements: Alone Available Help at Discharge: Family, Available PRN/intermittently (son checks in on him frequently per pt) Type of Home: Mobile home Home Access: Ramped entrance Home Layout: One level Bathroom Shower/Tub: [employer] social research officer Accessibility: No Additional Comments: Sons both work and have families  Lives With: Alone Prior Function Level of Independence: Independent with basic ADLs, Requires assistive device for independence, Independent with transfers, Independent with homemaking with ambulation, Independent with gait Vision Baseline Vision/History: 0 No visual deficits;1 Wears glasses ("readers") Ability to See in Adequate Light: 0 Adequate Patient Visual Report: No change from baseline Vision Assessment?: No apparent visual deficits Perception  Perception: Within Functional Limits Praxis Praxis: Intact Cognition Overall Cognitive Status: Within Functional Limits for tasks assessed Arousal/Alertness: Awake/alert Orientation Level: Person;Place;Situation Person: Oriented Place: Oriented Situation: Oriented Year: 2022 Month: September Day of Week: Correct Memory: Appears intact Immediate Memory Recall: Sock;Blue;Bed Memory Recall Sock: Without Cue Memory Recall Blue: Without Cue Memory Recall Bed: Without Cue Attention: Focused;Sustained;Selective Focused Attention: Appears intact Sustained Attention: Appears  intact Selective Attention: Appears intact Awareness: Appears intact Problem Solving: Appears intact Safety/Judgment: Appears intact Sensation Sensation Light Touch: Appears Intact Hot/Cold: Appears Intact Proprioception: Appears Intact Stereognosis: Not tested Coordination Gross Motor Movements are Fluid and Coordinated: No Fine Motor Movements are Fluid and Coordinated: No Finger Nose Finger Test: difficulty secondary to weakness BUE Motor  Motor Motor: Other (comment) (Weakness BUE (proximal>distal))  Trunk/Postural Assessment  Cervical Assessment Cervical Assessment: Exceptions to [facility] Cervical Collar donned) Thoracic Assessment Thoracic Assessment:  (rounded shoulders) Lumbar Assessment Lumbar Assessment:  (  posterior pelvic tilt) Postural Control Postural Control: Deficits on evaluation Postural Limitations: forward bend at hips and poor BLE control and alignment with reduced base of support  Balance Balance Balance Assessed: Yes Static Sitting Balance Static Sitting - Balance Support: No upper extremity supported;Feet supported Static Sitting - Level of Assistance: 5: Stand by assistance Dynamic Sitting Balance Dynamic Sitting - Balance Support: During functional activity;Feet supported Dynamic Sitting - Level of Assistance: 4: Min assist Static Standing Balance Static Standing - Balance Support: Right upper extremity supported;Left upper extremity supported;During functional activity Static Standing - Level of Assistance: 4: Min assist Dynamic Standing Balance Dynamic Standing - Balance Support: Right upper extremity supported;Left upper extremity supported;During functional activity Dynamic Standing - Level of Assistance: 3: Mod assist Extremity/Trunk Assessment RUE Assessment RUE Assessment: Exceptions to [facility] Passive Range of Motion (PROM) Comments: WNL Active Range of Motion (AROM) Comments: (Approximate): Shoulder scaption 20 degrees, ER (at 0 deg of  abduction: 30 degrees; elbow flexion 110 degrees/extension 10 degrees; wrist and hand WNL General Strength Comments: grip: 3+/5 ; all other joints not tested (cervical precautions) LUE Assessment LUE Assessment: Exceptions to [facility] Passive Range of Motion (PROM) Comments: WNL Active Range of Motion (AROM) Comments: (Approximate): Shoulder scaption 60 degrees, ER (at 0 deg of abduction: 45 degrees; elbow flexion 110 degrees/extension 10 degrees; wrist and hand WNL General Strength Comments: grip 4-/5; all other joint not tested (cervical precautions)  Care Tool Care Tool Self Care Eating   Eating Assist Level: Minimal Assistance - Patient > 75%    Oral Care    Oral Care Assist Level: Maximal assistance - Patient 25 - 49%    Bathing   Body parts bathed by patient: Right arm;Left arm;Chest;Abdomen;Front perineal area;Right upper leg;Left upper leg Body parts bathed by helper: Face;Buttocks;Left lower leg;Right lower leg   Assist Level: Moderate Assistance - Patient 50 - 74%    Upper Body Dressing(including orthotics)   What is the patient wearing?: Button up shirt   Assist Level: Maximal Assistance - Patient 25 - 49%    Lower Body Dressing (excluding footwear)     Assist for lower body dressing: Total Assistance - Patient < 25%    Putting on/Taking off footwear   What is the patient wearing?: Non-skid slipper socks Assist for footwear: Total Assistance - Patient < 25%       Care Tool Toileting Toileting activity   Assist for toileting: Maximal Assistance - Patient 25 - 49%     Care Tool Bed Mobility Roll left and right activity        Sit to lying activity        Lying to sitting on side of bed activity         Care Tool Transfers Sit to stand transfer        Chair/bed transfer         Toilet transfer   Assist Level: Moderate Assistance - Patient 50 - 74%     Care Tool Cognition  Expression of Ideas and Wants Expression of Ideas and Wants: 3. Some  difficulty - exhibits some difficulty with expressing needs and ideas (e.g, some words or finishing thoughts) or speech is not clear  Understanding Verbal and Non-Verbal Content Understanding Verbal and Non-Verbal Content: 3. Usually understands - understands most conversations, but misses some part/intent of message. Requires cues at times to understand   Memory/Recall Ability Memory/Recall Ability : Current season;Location of own room;That he or she is in a hospital/hospital unit  Refer to Care Plan for Long Term Goals  SHORT TERM GOAL WEEK 1 OT Short Term Goal 1 (Week 1): Pt will complete sit<>stand transfers using LRAD with CGA in preperation for dressing and toileting OT Short Term Goal 2 (Week 1): Pt will complete UB dressing with min assist OT Short Term Goal 3 (Week 1): Pt will increased right shoulder scaption to 50 degrees for functional reaching tasks. OT Short Term Goal 4 (Week 1): Pt will donn shorts using reacher with min assist.  Recommendations for other services: None    Skilled Therapeutic Intervention ADL ADL Grooming: Dependent Where Assessed-Grooming: Sitting at sink Upper Body Bathing: Minimal assistance Where Assessed-Upper Body Bathing: Sitting at sink Lower Body Bathing: Maximal assistance Where Assessed-Lower Body Bathing: Sitting at sink;Standing at sink Upper Body Dressing: Maximal assistance Where Assessed-Upper Body Dressing: Sitting at sink Lower Body Dressing: Maximal assistance Where Assessed-Lower Body Dressing: Sitting at sink;Standing at sink Mobility  Bed Mobility Bed Mobility: Rolling Right;Right Sidelying to Sit Rolling Right: Moderate Assistance - Patient 50-74% Right Sidelying to Sit: Maximal Assistance - Patient 25-49% Transfers Sit to Stand: Moderate Assistance - Patient 50-74% Stand to Sit: Moderate Assistance - Patient 50-74%  Skilled Intervention:  Pt semi reclined in bed, no c/o pain, eager to work with OT stating "I want to work  hard and listen to what ya'll tell me".  Pt wishing to change into his own clothes during session.  Initial evaluation completed and pt educated on OT scope of practice, as well as collaboration with pt on OT POC.  Pt completed above listed self care and functional mobility. MD arrived and spoke to pt/completed assessment midway through session while pt seated EOB with OT providing supervision for static balance.  Pt sitting up in w/c at end of session call bell in reach, seat alarm on.  Educated pt on doffing technique of seat alarm and instructed pt to request nursing assistance if needing to get up.     Discharge Criteria: Patient will be discharged from OT if patient refuses treatment 3 consecutive times without medical reason, if treatment goals not met, if there is a change in medical status, if patient makes no progress towards goals or if patient is discharged from hospital.  The above assessment, treatment plan, treatment alternatives and goals were discussed and mutually agreed upon: by patient  Ezekiel Slocumb 01/04/2021, 1:14 PM

## 2021-01-04 NOTE — Evaluation (Signed)
Physical Therapy Assessment and Plan  Patient Details  Name: Logan [name] MRN: [id number] Date of Birth: 26-Jan-1946  PT Diagnosis: Abnormal posture, Abnormality of gait, Contracture of joint: ankles, knee, Coordination disorder, Difficulty walking, Muscle weakness, and Quadriplegia Rehab Potential: Good ELOS: 18-21 days   Today's Date: 01/04/2021 PT Individual Time: 1400-1505 and 745-800 PT Individual Time Calculation (min): 65 min  and 15 min  Hospital Problem: Principal Problem:   Cervical disc disease with myelopathy Active Problems:   Hypoxia   Supplemental oxygen dependent   Hypoalbuminemia due to protein-calorie malnutrition (HCC)   Hypokalemia   Macrocytic anemia   Prediabetes   Past Medical History:  Past Medical History:  Diagnosis Date   Anxiety    Atherosclerotic renal artery stenosis, unilateral ([place]) 02/28/2014   Atrial fibrillation (HCC)    Bilateral lower extremity edema    Chronic diastolic heart failure ([place]) 10/21/2019   Diabetes mellitus without complication ([place])    DVT (deep venous thrombosis) ([place])    Esophageal reflux    Essential hypertension 10/21/2019   Gout    Hyperlipidemia    Hypertension    Mixed hyperlipidemia 10/21/2019   Morbid obesity ([place])    Obesity (BMI 30-39.9) 08/24/2019   Orthopnea    Osteoarthritis    Persistent atrial fibrillation ([place]) 10/21/2019   Seasonal allergies    Sleep apnea    Past Surgical History:  Past Surgical History:  Procedure Laterality Date   ABDOMINAL AORTAGRAM N/A 02/28/2014   Procedure: ABDOMINAL AORTAGRAM;  Surgeon: Serafina Mitchell, MD;  Location: [facility] CATH LAB;  Service: Cardiovascular;  Laterality: N/A;   ANTERIOR CERVICAL DECOMP/DISCECTOMY FUSION N/A 01/01/2021   Procedure: ACDF C3-4;  Surgeon: Dawley, Theodoro Doing, DO;  Location: [place];  Service: Neurosurgery;  Laterality: N/A;   APPENDECTOMY     ATRIAL FIBRILLATION ABLATION N/A 02/29/2020   Procedure: ATRIAL FIBRILLATION ABLATION;  Surgeon: Constance Haw,  MD;  Location: [place] CV LAB;  Service: Cardiovascular;  Laterality: N/A;   CARDIOVERSION N/A 10/18/2020   Procedure: CARDIOVERSION (CATH LAB);  Surgeon: Constance Haw, MD;  Location: [place] CV LAB;  Service: Cardiovascular;  Laterality: N/A;   CARPAL TUNNEL RELEASE Left     Assessment & Plan Clinical Impression: Logan [name] is a 75 year old male with history of HTN, A fib, OSA, DVT, chronic diastolic CHF, [id number] controlled who started developing weakness with numbness and tingling BLE for past 6 months which progressed to falls and inability to walk. He was essentially wheelchair bound for the past month with son checking in frequently during the day and assisting with meals. He has had multiple ED visits and on last in 07/22, was found to have cervical myelopathy C3/C4 due to severe canal stenosis with anterolisthesis C3 on C4. He was cleared for surgery by PCP/cardiology, Eliquis placed on hold and was admitted on 01/01/21 for ACDF C3-C4 by Dr. Reatha Armour. Post op reporting  improvement in sensation and strength of BUE, improvement in speech clarity as well as pain. He has developed cough with hypoxia as well as difficulty with meals felt to be due to intubation. Therapy evaluations completed revealing difficulty with ADLs due to BUE weakness, balance deficits, Leg length discrepancy as well as decreased endurance. CIR recommended due to functional decline. C/o cough and left hip pain. .  Patient transferred to CIR on 01/03/2021 .  P  Patient currently requires max with mobility secondary to muscle weakness, muscle joint tightness, and muscle paralysis, decreased cardiorespiratoy endurance, and unbalanced  muscle activation and decreased coordination.  Prior to hospitalization, pt experienced gradual progressive weakness and patient was max with mobility.  He  lived with Alone in a Mobile home home.  Prior to recent onset of sxs, pt was independent w/all activties.   Home access is  Ramped  entrance.  Patient will benefit from skilled PT intervention to maximize safe functional mobility, minimize fall risk, and decrease caregiver burden for planned discharge home with intermittent assist.  Anticipate patient will benefit from follow up [facility] at discharge.  PT - End of Session Activity Tolerance: Tolerates 30+ min activity with multiple rests Endurance Deficit: Yes Endurance Deficit Description: standing/gait limited by fatigue, mult seated rest breaks required, global weakness PT Assessment Rehab Potential (ACUTE/IP ONLY): Good PT Barriers to Discharge: [place] home environment;Decreased caregiver support;Home environment access/layout;Incontinence;Lack of/limited family support PT Barriers to Discharge Comments: sacral skin breakdown noted during eval, nursing notifies PT Patient demonstrates impairments in the following area(s): Balance;Endurance;Motor;Safety;Sensory;Skin Integrity PT Transfers Functional Problem(s): Bed Mobility;Bed to Chair;Car;Furniture PT Locomotion Functional Problem(s): Ambulation;Wheelchair Mobility;Stairs PT Plan PT Intensity: Minimum of 1-2 x/day ,45 to 90 minutes PT Frequency: 5 out of 7 days PT Duration Estimated Length of Stay: 18-21 days PT Treatment/Interventions: Ambulation/gait training;Balance/vestibular training;Disease management/prevention;Functional mobility training;DME/adaptive equipment instruction;Neuromuscular re-education;Patient/family education;Psychosocial support;Skin care/wound management;Splinting/orthotics;Therapeutic Activities;Therapeutic Exercise;UE/LE Strength taining/ROM;UE/LE Coordination activities;Wheelchair propulsion/positioning PT Transfers Anticipated Outcome(s): mod I PT Locomotion Anticipated Outcome(s): Mod I household ambulator PT Recommendation Recommendations for Other Services: Therapeutic Recreation consult Therapeutic Recreation Interventions:  (TBD by therapist) Follow Up Recommendations: Home health  PT Patient destination: Home Equipment Recommended: To be determined Equipment Details: has rollator and 2WRW - states it is "too tall"   PT Evaluation Precautions/Restrictions Precautions Precautions: Fall;Cervical Required Braces or Orthoses: Cervical Brace Cervical Brace: Hard collar;At all times;Other (comment)  Pain Pain Assessment Pain Scale: 0-10 Pain Score: 0-No pain Pain Interference Pain Interference Pain Effect on Sleep: 2. Occasionally Pain Interference with Therapy Activities: 1. Rarely or not at all Pain Interference with Day-to-Day Activities: 2. Occasionally Home Living/Prior Functioning Home Living Available Help at Discharge: Family;Available PRN/intermittently Type of Home: Mobile home Home Access: Ramped entrance Home Layout: One level Bathroom Shower/Tub: Chiropodist: Standard Bathroom Accessibility: No Additional Comments: Sons both work and have families  Lives With: Alone Prior Function Level of Independence: Independent with basic ADLs;Requires assistive device for independence;Independent with transfers;Independent with homemaking with ambulation;Independent with gait  Able to Take Stairs?: No Driving: Yes Vocation: Retired Vision/Perception  Vision - History Ability to See in Adequate Light: 0 Adequate Perception Perception: Within Functional Limits Praxis Praxis: Intact  Cognition Overall Cognitive Status: Within Functional Limits for tasks assessed Arousal/Alertness: Awake/alert Year: 2022 Month: September Day of Week: Correct Attention: Focused;Sustained;Selective Focused Attention: Appears intact Sustained Attention: Appears intact Selective Attention: Appears intact Memory: Appears intact Immediate Memory Recall: Sock;Blue;Bed Memory Recall Sock: Without Cue Memory Recall Blue: Without Cue Memory Recall Bed: Without Cue Awareness: Appears intact Problem Solving: Appears intact Safety/Judgment: Appears  intact Sensation Sensation Light Touch: Appears Intact Hot/Cold: Appears Intact Proprioception: Appears Intact (decreased bilat great toes only) Stereognosis: Not tested Coordination Gross Motor Movements are Fluid and Coordinated: No Fine Motor Movements are Fluid and Coordinated: No Finger Nose Finger Test: difficulty secondary to weakness BUE Heel Shin Test: pt w/mild ataxic quality to movement bilat Motor  Motor Motor: Other (comment) Motor - Skilled Clinical Observations: weakness x 4 extremities proximal > distal   Trunk/Postural Assessment  Cervical Assessment Cervical Assessment: Exceptions to [facility] (ccollar/immobilized) Thoracic  Assessment Thoracic Assessment: Exceptions to [facility] (forward head, kyphosis, forward trunk lean in standing) Lumbar Assessment Lumbar Assessment: Exceptions to [facility] (post tilt) Postural Control Postural Control: Deficits on evaluation Postural Limitations: narrow base, leg length discrepancy stands w/R knee flexed, forward trunk lean  Balance Balance Balance Assessed: Yes Static Sitting Balance Static Sitting - Balance Support: No upper extremity supported;Feet supported Static Sitting - Level of Assistance: 5: Stand by assistance Dynamic Sitting Balance Dynamic Sitting - Balance Support: During functional activity;Feet supported Dynamic Sitting - Level of Assistance: 4: Min assist Dynamic Sitting - Balance Activities: Lateral lean/weight shifting;Forward lean/weight shifting Static Standing Balance Static Standing - Balance Support: During functional activity Static Standing - Level of Assistance: 2: Max assist Dynamic Standing Balance Dynamic Standing - Balance Support: Right upper extremity supported;Left upper extremity supported;During functional activity Dynamic Standing - Level of Assistance: 3: Mod assist Extremity Assessment  RUE Assessment RUE Assessment: Exceptions to [facility] Passive Range of Motion (PROM) Comments: WNL Active Range  of Motion (AROM) Comments: see ot eval General Strength Comments: see ot eval, pt w/severe proximal weakness noted during functional tasks LUE Assessment LUE Assessment: Exceptions to [facility] Passive Range of Motion (PROM) Comments: WNL Active Range of Motion (AROM) Comments: see OT eval General Strength Comments: see OT eval, able to reach to at least 90* during functional task RLE Assessment RLE Assessment: Exceptions to [facility] Passive Range of Motion (PROM) Comments: knee extension limited by arthritic changes, lacks approx 15* extension, ankles w/limited DF/PF A/PROM Active Range of Motion (AROM) Comments: 3/5 in avail range at ankles, knees, hips 2/5 in avail range General Strength Comments: see above LLE Assessment LLE Assessment: Within Functional Limits Passive Range of Motion (PROM) Comments: WFLs General Strength Comments: Grossly 2/5 proximally, 3/5 knees/ankles  Care Tool Care Tool Bed Mobility Roll left and right activity   Roll left and right assist level: Maximal Assistance - Patient 25 - 49%    Sit to lying activity   Sit to lying assist level: Maximal Assistance - Patient 25 - 49%    Lying to sitting on side of bed activity   Lying to sitting on side of bed assist level: the ability to move from lying on the back to sitting on the side of the bed with no back support.: Maximal Assistance - Patient 25 - 49%     Care Tool Transfers Sit to stand transfer   Sit to stand assist level: Maximal Assistance - Patient 25 - 49%    Chair/bed transfer   Chair/bed transfer assist level: 2 Pension scheme manager transfer   Assist Level: 2 Psychologist, prison and probation services transfer assist level: 2 helpers      Psychiatric nurse   Assist level: 2 helpers Assistive device: Walker-rolling Max distance: 15  Walk 10 feet activity   Assist level: 2 helpers Assistive device: Walker-rolling   Walk 50 feet with 2 turns activity Walk 50 feet with 2 turns activity did not  occur: Safety/medical concerns      Walk 150 feet activity Walk 150 feet activity did not occur: Safety/medical concerns      Walk 10 feet on uneven surfaces activity Walk 10 feet on uneven surfaces activity did not occur: Safety/medical concerns      Stairs Stair activity did not occur: Safety/medical concerns        Walk up/down 1 step activity Walk up/down 1 step or curb (drop down) activity did not occur: Safety/medical  concerns     Walk up/down 4 steps activity did not occuR: Safety/medical concerns  Walk up/down 4 steps activity      Walk up/down 12 steps activity Walk up/down 12 steps activity did not occur: Safety/medical concerns      Pick up small objects from floor Pick up small object from the floor (from standing position) activity did not occur: Safety/medical concerns      Wheelchair Is the patient using a wheelchair?: Yes Type of Wheelchair: Manual   Wheelchair assist level: Maximal Assistance - Patient 25 - 49% Max wheelchair distance: 5  Wheel 50 feet with 2 turns activity Wheelchair 50 feet with 2 turns activity did not occur: Safety/medical concerns    Wheel 150 feet activity Wheelchair 150 feet activity did not occur: Safety/medical concerns      Refer to Care Plan for Long Term Goals  SHORT TERM GOAL WEEK 1 PT Short Term Goal 1 (Week 1): Pt will perform supine to sit w/mod assist and LRAD PT Short Term Goal 2 (Week 1): Pt will transfer bed to/from wc w/mod assist and LRAD PT Short Term Goal 3 (Week 1): Pt will ambulate 14ft w/min assist and LRAD  Recommendations for other services: Therapeutic Recreation  Other TBD by therapist  Skilled Therapeutic Intervention AM session: Pain - denies pain Pt noted to be weaing cervical collar [place].  Repositioned by therapist and NT educated. Therapist initiated evaluation.  WC and cushion obtained for continuation of evaluation next session.  PM session: Pt initially oob in wc and requesting urgent  assist to [facility] for bowel movement.  Squat pivot wc to [facility] max assist of 1 w/second person stabilizing equipment.  Sit to stand max assist for clothing management.  Pt continent of bowels.  Sit to stand using stedy w/min assist.  Total assist for pericare, clothing management.  Noted sacral breakdown, nurisng notified.  Transfer to bed w/Stedy.  Sit to supine w/max assist.  Therapist obtained Roh[place] cushion.  Supine to sit max assist.  Evaluation completed (see details above and below) with education on PT POC and goals and individual treatment initiated with focus on functional mobility/transfers, LE strength, dynamic standing balance/coordination, ambulation, stair navigation, simulated car transfers, and improved endurance with activity Pt   oriented to rehab unit.  Discussed process of daily scheduling and receiving schedule, purpose  And team conference schedule and D/c planning. .  Pt also provided w/ RW and therapist adjusted to proper height for patient.   Sit to stand from edge of bed w/mod assist of 2.  Gait 36ft w/RW, mod assist of 2, flexed posture, see eval for full assessment, wc follow for safety. Condom cath leaking.  Sit to stand in stedy and NT in to assist therapist w/repeated pericare, removal of condom cath, brief application, changing shorts all w/total assist.  Pt returned to wc, adjustements made for proper fit/optimizing pressure relief.   Pt left oob in wc w/alarm belt set and needs in reach   Mobility Bed Mobility Bed Mobility: Rolling Right;Right Sidelying to Sit Rolling Right: Maximal Assistance - Patient 25-49% Right Sidelying to Sit: Maximal Assistance - Patient 25-49% Transfers Transfers: Stand to Sit;Sit to Stand;Stand Pivot Transfers Sit to Stand: Maximal Assistance - Patient 25-49% Stand to Sit: Maximal Assistance - Patient 25-49% Stand Pivot Transfers: Maximal Assistance - Patient 25 - 49% Stand Pivot Transfer Details: Tactile cues for initiation;Tactile cues for  posture;Tactile cues for placement;Tactile cues for weight shifting;Tactile cues for weight beaing;Verbal cues for gait  pattern Transfer (Assistive device): Rolling walker Locomotion  Gait Ambulation: Yes Gait Assistance: 2 Holiday representative (Feet): 15 Feet Assistive device: Rolling walker Gait Assistance Details: tactile cues for R knee stability, verbal cues for posture, min assist to stabilize walker, wc follow Gait Gait: Yes Gait Pattern: Impaired Gait Pattern:  (pt w/lonstanding signficant leg length discrepancy resulting in R knee maintained in approx 20+* flexion thru stance and resultant gait deviations, forward trunk lean, step to gait, decreased step length, decreased clearance bilat) Gait velocity: very impaired/slowed Stairs / Additional Locomotion Stairs: No Wheelchair Mobility Wheelchair Mobility: Yes Wheelchair Assistance: Maximal Assistance - Patient 25 - 49% Wheelchair Propulsion: Both upper extremities Wheelchair Parts Management: Needs assistance Distance: 5   Discharge Criteria: Patient will be discharged from PT if patient refuses treatment 3 consecutive times without medical reason, if treatment goals not met, if there is a change in medical status, if patient makes no progress towards goals or if patient is discharged from hospital.  The above assessment, treatment plan, treatment alternatives and goals were discussed and mutually agreed upon: by patient  Jerrilyn Cairo 01/04/2021, 3:54 PM

## 2021-01-04 NOTE — Progress Notes (Signed)
[facility] Individual Statement of Services  Patient Name:  Logan [name]  Date:  01/04/2021  Welcome to the [facility].  Our goal is to provide you with an individualized program based on your diagnosis and situation, designed to meet your specific needs.  With this comprehensive rehabilitation program, you will be expected to participate in at least 3 hours of rehabilitation therapies Monday-Friday, with modified therapy programming on the weekends.  Your rehabilitation program will include the following services:  Physical Therapy (PT), Occupational Therapy (OT), Speech Therapy (ST), 24 hour per day rehabilitation nursing, Therapeutic Recreaction (TR), Care Coordinator, Rehabilitation Medicine, Nutrition Services, and Pharmacy Services  Weekly team conferences will be held on Tuesday to discuss your progress.  Your Inpatient Rehabilitation Care Coordinator will talk with you frequently to get your input and to update you on team discussions.  Team conferences with you and your family in attendance may also be held.  Expected length of stay: 18-21 days  Overall anticipated outcome: Independent with device  Depending on your progress and recovery, your program may change. Your Inpatient Rehabilitation Care Coordinator will coordinate services and will keep you informed of any changes. Your Inpatient Rehabilitation Care Coordinator's name and contact numbers are listed  below.  The following services may also be recommended but are not provided by the [facility] [place] will be made to provide these services after discharge if needed.  Arrangements include referral to agencies that provide these services.  Your insurance has been verified to be:  [place] primary doctor is:  [place] Guardian  Pertinent information will  be shared with your doctor and your insurance company.  Inpatient Rehabilitation Care Coordinator:  Ovidio Kin, [place] or Emilia Beck  Information discussed with and copy given to patient by: Elease Hashimoto, 01/04/2021, 11:18 AM

## 2021-01-04 NOTE — Progress Notes (Signed)
[place] PHYSICAL MEDICINE & REHABILITATION PROGRESS NOTE  Subjective/Complaints: Patient seen sitting up at the edge of his bed this morning.  Good sitting balance noted.  He is working with therapies.  ROS: Denies CP, SOB, N/V/D  Objective: Vital Signs: Blood pressure 111/62, pulse 91, temperature 98.5 F (36.9 C), temperature source Oral, resp. rate 14, height '5\' 7"'$  (1.702 m), weight 83.2 kg, SpO2 91 %. DG Chest 2 View  Result Date: 01/03/2021 CLINICAL DATA:  Persistent cough EXAM: CHEST - 2 VIEW COMPARISON:  11/13/2020 FINDINGS: Single frontal view of the chest demonstrates an unremarkable cardiac silhouette. Consolidation at the left lung base could reflect airspace disease or atelectasis. No effusion or pneumothorax. No acute bony abnormalities. Postsurgical changes are seen in the right supraclavicular region. IMPRESSION: 1. Left basilar consolidation, favor atelectasis given recent cervical spine surgery. Electronically Signed   By: Randa Ngo M.D.   On: 01/03/2021 17:14   DG HIP UNILAT WITH PELVIS 2-3 VIEWS LEFT  Result Date: 01/03/2021 CLINICAL DATA:  Left hip pain without trauma EXAM: DG HIP (WITH OR WITHOUT PELVIS) 2-3V LEFT COMPARISON:  11/05/2020 FINDINGS: AP view the pelvis and AP/frog leg views of the left hip. Marked left femoral head flattening and altered morphology, suggesting developmental dysplasia. Concurrent joint space narrowing and subchondral sclerosis within both femoral head and acetabulum. No acute fracture or dislocation. IMPRESSION: No significant change since the prior exam. Left hip osteoarthritis, likely secondary to developmental dysplasia. Electronically Signed   By: Abigail Miyamoto M.D.   On: 01/03/2021 17:13   Recent Labs    01/01/21 1829 01/04/21 0511  WBC 9.6 10.0  HGB 12.2* 11.5*  HCT 36.2* 35.7*  PLT 310 248   Recent Labs    01/01/21 1829 01/04/21 0511  NA  --  144  K  --  3.2*  CL  --  98  CO2  --  35*  GLUCOSE  --  91  BUN  --  12   CREATININE 0.73 0.73  CALCIUM  --  9.2    Intake/Output Summary (Last 24 hours) at 01/04/2021 1040 Last data filed at 01/04/2021 0750 Gross per 24 hour  Intake 840 ml  Output 700 ml  Net 140 ml        Physical Exam: BP 111/62 (BP Location: Left Arm)   Pulse 91   Temp 98.5 F (36.9 C) (Oral)   Resp 14   Ht '5\' 7"'$  (1.702 m)   Wt 83.2 kg   SpO2 91%   BMI 28.73 kg/m  Constitutional: No distress . Vital signs reviewed. HENT: Normocephalic.  Atraumatic. Edentulous  Neck: + C-collar. Eyes: EOMI. No discharge. Cardiovascular: No JVD.  Irregularly irregular. Respiratory: Normal effort.  No stridor.  Bilateral clear to auscultation.  + Iselin. GI: Non-distended.  BS +. Skin: Warm and dry.  Anterior cervical dressing CDI Knees with dressing CDI Psych: Normal mood.  Normal behavior. Musc: No edema in extremities.  No tenderness in extremities. Neuro: Alert and oriented Dysphonia Motor: Bilateral upper extremities: Shoulder abduction 2/5, elbow flexion/extension 3+/5, handgrip 4/5 Bilateral lower extremities: 4-/5 proximal distal  Assessment/Plan: 1. Functional deficits which require 3+ hours per day of interdisciplinary therapy in a comprehensive inpatient rehab setting. Physiatrist is providing close team supervision and 24 hour management of active medical problems listed below. Physiatrist and rehab team continue to assess barriers to discharge/monitor patient progress toward functional and medical goals   Care Tool:  Bathing  Bathing assist       Upper Body Dressing/Undressing Upper body dressing   What is the patient wearing?: Hospital gown only    Upper body assist Assist Level: Maximal Assistance - Patient 25 - 49%    Lower Body Dressing/Undressing Lower body dressing            Lower body assist       Toileting Toileting    Toileting assist Assist for toileting: Maximal Assistance - Patient 25 - 49%     Transfers Chair/bed  transfer  Transfers assist     Chair/bed transfer assist level: 2 Helpers     Locomotion Ambulation   Ambulation assist              Walk 10 feet activity   Assist           Walk 50 feet activity   Assist           Walk 150 feet activity   Assist           Walk 10 feet on uneven surface  activity   Assist           Wheelchair     Assist               Wheelchair 50 feet with 2 turns activity    Assist            Wheelchair 150 feet activity     Assist           Medical Problem List and Plan: 1.  Cervical myelopathy   Begin CIR evaluations 2.  Antithrombotics: -DVT/anticoagulation:  Pharmaceutical: Heparin--low dose. Was on Eliquis PTA--held due to surgery--> to resume on 9/23 per recs.             -antiplatelet therapy: N/A 3. Endstate OA left hip/Pain Management: Oxycodone prn             Added voltaren gel to left hip QID             Xray showing left hip OA  Monitor with increased exertion 4. Mood: LCSW to follow for evaluation and support.              -antipsychotic agents: N/A 5. Neuropsych: This patient is capable of making decisions on his own behalf. 6. Skin/Wound Care:  Routine pressure relief measures--monitor incision for healing.              --D/ced bacitracin ointement Added Aquacell with dry dressing to bilateral knee abrasions.  7. Fluids/Electrolytes/Nutrition: Monitor I/Os.  8. CAF: Monitor HR TID--continue to hold eliquis till 09/23             --On coreg BID.  Filed Weights   01/03/21 1653  Weight: 83.2 kg  9.  Dehydration: Encourage fluid intake. Monitor orthostatic symptoms 10. Hypoxia/ Productive cough: Will down grade diet to dysphagia 3 with aspiration precautions/supervison             --has been cleared to remove collar for meals.              --CXR showing atelectasis  Wean supplemental oxygen as tolerated 11.  Prediabetes diet controlled?: Has been off meds for 5 years.   Hemoglobin A1c 5.7 on 9/16 Monitor with increased mobility 12. HTN: Monitor BP TID--continue Lasix and Cozaar. BP currently soft but as expected for SCI  Monitor with increased mobility 13.  Macrocytic anemia:   Vitamin B12 elevated on 7/18  Folate  ordered Hemoglobin 11.5 on 9/16 14. OSA: Does not use CPAP. 15. Constipation: Has not had BM for a couple of days             Added Senna S.   MiraLAX started on 9/16 16. Overweight BMI 28.82: provide dietary education 17.  Hypokalemia  Potassium 3.2 on 9/16, labs ordered for Monday  Supplemented increased on 9/16 18.  Hypoalbuminemia  Supplement initiated on 9/16  LOS: 1 days A FACE TO FACE EVALUATION WAS PERFORMED  Logan [name] Logan [name] 01/04/2021, 10:40 AM

## 2021-01-04 NOTE — Progress Notes (Signed)
Pt slept in brief intervals throughout the night. Turned and repositioned frequently. Robitussin 10 ml given prn for cough-partially effective. Sputum specimen sent to lab as ordered. No c/o pain this shift.

## 2021-01-04 NOTE — Progress Notes (Signed)
Occupational Therapy Session Note  Patient Details  Name: Logan [name] MRN: [id number] Date of Birth: 06-Dec-1945  Today's Date: 01/05/2021 OT Individual Time: [id number] and 1500-1530 OT Individual Time Calculation (min): 60 min and 30 min  Short Term Goals: Week 1:  OT Short Term Goal 1 (Week 1): Pt will complete sit<>stand transfers using LRAD with CGA in preperation for dressing and toileting OT Short Term Goal 2 (Week 1): Pt will complete UB dressing with min assist OT Short Term Goal 3 (Week 1): Pt will increased right shoulder scaption to 50 degrees for functional reaching tasks. OT Short Term Goal 4 (Week 1): Pt will donn shorts using reacher with min assist.  Skilled Therapeutic Interventions/Progress Updates:    Pt greeted in bed, reporting that he had a "rough night" due to frequent BMs, satting at 100% on 2L. He declined participating in a full ADL today but was agreeable to start session by donning shorts + gripper socks. While EOB, pt able to doff 1 gripper sock using his foot as he was unable to reach with either hand. Total A for donning clean gripper socks. Pt able to thread both LEs into shorts. During transition to stand using RW, pt with audible gas/+BM. Worked on sit<stands and standing endurance during perihygiene, brief change, and pulling up pants. Note that pt has a very narrow base of support, relies heavily on the walker for stability with hunched posture. OT sat in front of him to provide manual cues for upright posture, foot between his feet to assist with widening base of support. Sit<stands with Mod A for lifting assistance and postural assistance. He required Total A for hygiene and dressing tasks. 02 sats post stands 90-92% on 2L. Total A to comb hair due to proximal UE weakness Rt>Lt. He was able to wash hands using sanitizer with setup assistance. Pt then scooted up in bed and transitioned to supine. Assisted him with repositioning in partial sidelying to address  buttocks pain at close of tx. All needs within reach, bed alarm set, pt satting at 97%.   2nd Session 1:1 tx () Pt greeted in bed, noted that his c-collar had riden up over his chin and 1 pad had fallen out. Laid bed flat and assisted pt with adjusting his c-collar. Notified RN that he needs a change of c-collar pads. Supine<sit completed with Max A. While EOB, worked on active assist ROM of B UEs. Pt with more shoulder ROM Lt>Rt. Pt with more truncal compensatory strategies on the Rt side. Discussed propping elbows on his table when eating to limit degrees of freedom and increase ease of eating. Advised him to always be sitting up to eat his meals. To work on UE ROM pt participated in a ball toss with vcs to reduce compensatory strategies. Pt required Mod A for anterior weight shifting when scooting up towards [place]. Mod A to return to bed and he boosted himself up using LEs after OT stabilized feet. Pt remained in partial sidelying in bed for pressure relief, left him with all needs within reach and bed alarm set.   Therapy Documentation Precautions:  Precautions Precautions: Fall, Cervical Required Braces or Orthoses: Cervical Brace Cervical Brace: Hard collar, At all times, Other (comment) Vital Signs: Therapy Vitals Temp: 98 F (36.7 C) Temp Source: Oral Pulse Rate: 60 Resp: 17 BP: 104/75 Patient Position (if appropriate): Sitting Oxygen Therapy SpO2: 99 % O2 Device: Nasal Cannula Pain: Pain Assessment Pain Scale: 0-10 Pain Score: 0-No pain ADL: ADL Grooming:  Dependent Where Assessed-Grooming: Sitting at sink Upper Body Bathing: Minimal assistance Where Assessed-Upper Body Bathing: Sitting at sink Lower Body Bathing: Maximal assistance Where Assessed-Lower Body Bathing: Sitting at sink, Standing at sink Upper Body Dressing: Maximal assistance Where Assessed-Upper Body Dressing: Sitting at sink Lower Body Dressing: Maximal assistance Where Assessed-Lower Body Dressing: Sitting  at sink, Standing at sink     Therapy/Group: Individual Therapy  Logan [name] 01/05/2021, 1:19 PM

## 2021-01-05 LAB — GLUCOSE, CAPILLARY
Glucose-Capillary: 110 mg/dL — ABNORMAL HIGH (ref 70–99)
Glucose-Capillary: 144 mg/dL — ABNORMAL HIGH (ref 70–99)
Glucose-Capillary: 99 mg/dL (ref 70–99)

## 2021-01-05 LAB — EXPECTORATED SPUTUM ASSESSMENT W GRAM STAIN, RFLX TO RESP C: Special Requests: NORMAL

## 2021-01-05 NOTE — Progress Notes (Signed)
Physical Therapy Session Note  Patient Details  Name: Logan [name] MRN: [id number] Date of Birth: [birth year]  Today's Date: 01/05/2021 PT Individual Time: 1105-1205 PT Individual Time Calculation (min): 60 min   Short Term Goals: Week 1:  PT Short Term Goal 1 (Week 1): Pt will perform supine to sit w/mod assist and LRAD PT Short Term Goal 2 (Week 1): Pt will transfer bed to/from wc w/mod assist and LRAD PT Short Term Goal 3 (Week 1): Pt will ambulate 26f w/min assist and LRAD  Skilled Therapeutic Interventions/Progress Updates:   Pain:  Pt reports no pain.  Treatment to tolerance.  Rest breaks and repositioning as needed.  Pt initially supine and agreeable to treatment session w/focus on gait, functional mobility, LE strength. Pt supine to sit using rail and cga. Sit to [place] w/min tomod assist.  R lean due to leg length discrepancy Bed to wc via stedy then stand to sit w/mod assist due to buckiling L knee. Transported to gym Parallel bars - trialed various step hights under L foot to accomodate for leg length discrepancy. One Inch provided best postural correction/tolerance /much improved R knee extension, leveling of pelvis.   W/sit to stand to sit pt w/notable bilat knee crepitice, decreased eccentric control w/lowering.   Would benefit from consultation for shoe lift to decreased leg length discrepancy to tolerance.  Gait 235f 2563f/progressive improvement in stability and cadence.  No  buckling of knee during gait, wc follow for safety.  Pt w/R lean, flexed posture, moderate R knee flexion thru stance/habitual due to leg length.  Wc retropulsion [id number] for quad strenghtening and LE ROM.   At end of session, Pt left oob in wc w/alarm belt set and needs in reach.  Discussed Stedy transfer w/NT due to no available dryerase for safety plan in room.  NT agreed to mark plan.    Therapy Documentation Precautions:  Precautions Precautions: Fall,  Cervical Required Braces or Orthoses: Cervical Brace Cervical Brace: Hard collar, At all times, Other (comment)     Therapy/Group: Individual Therapy BarbaCallie Fielding  [place]/2022, 12:31 PM

## 2021-01-05 NOTE — Progress Notes (Signed)
Occupational Therapy Session Note  Patient Details  Name: Logan [name] MRN: [id number] Date of Birth: 1946-02-28  Today's Date: 01/06/2021 OT Individual Time: 8182-9937 OT Individual Time Calculation (min): 57 min    Short Term Goals: Week 1:  OT Short Term Goal 1 (Week 1): Pt will complete sit<>stand transfers using LRAD with CGA in preperation for dressing and toileting OT Short Term Goal 2 (Week 1): Pt will complete UB dressing with min assist OT Short Term Goal 3 (Week 1): Pt will increased right shoulder scaption to 50 degrees for functional reaching tasks. OT Short Term Goal 4 (Week 1): Pt will donn shorts using reacher with min assist.  Skilled Therapeutic Interventions/Progress Updates:    Pt greeted in the w/c, stating that he had the worst night last night and "everything that could go wrong went wrong." His ADL needs were presently met. Escorted pt via w/c to an outdoor setting to lift his spirits. To work on [employer] strengthening/NMR pt self propelled his w/c over uneven surfaces, up/down inclines, and around environmental obstacles. CGA and vcs for propulsion technique. Pt also completed 5 sit<stands using RW. With repetition and vcs, pt required Mod fading to heavy Min A for sit<stands. He was then returned to the room via w/c. He remained sitting up, affect noticeably brighter. All needs within reach and safety belt fastened. Note that pt able to exhibit ~90 degrees AROM of Lt shoulder flexion today! We celebrated!   Therapy Documentation Precautions:  Precautions Precautions: Fall, Cervical Required Braces or Orthoses: Cervical Brace Cervical Brace: Hard collar, At all times, Other (comment)  Vital Signs: Therapy Vitals Temp: 98 F (36.7 C) Temp Source: Oral Pulse Rate: (!) 58 Resp: 17 BP: 114/77 Patient Position (if appropriate): Sitting Oxygen Therapy SpO2: 100 % O2 Device: Room Air Pain: no c/o pain during tx   ADL: ADL Grooming: Dependent Where  Assessed-Grooming: Sitting at sink Upper Body Bathing: Minimal assistance Where Assessed-Upper Body Bathing: Sitting at sink Lower Body Bathing: Maximal assistance Where Assessed-Lower Body Bathing: Sitting at sink, Standing at sink Upper Body Dressing: Maximal assistance Where Assessed-Upper Body Dressing: Sitting at sink Lower Body Dressing: Maximal assistance Where Assessed-Lower Body Dressing: Sitting at sink, Standing at sink     Therapy/Group: Individual Therapy  Terrilynn Postell A Tzivia Oneil 01/06/2021, 3:44 PM

## 2021-01-05 NOTE — Progress Notes (Signed)
Speech Language Pathology Daily Session Note  Patient Details  Name: Logan [name] MRN: [id number] Date of Birth: 1946-01-31  Today's Date: 01/05/2021 SLP Individual Time: [id number] SLP Individual Time Calculation (min): 44 min  Short Term Goals: Week 1: SLP Short Term Goal 1 (Week 1): Patient will tolerate Dys 3 solids and trials of regular texture solids without overt s/s aspiration or penetration, with efficient mastication and oral transit and without patient c/o discomfort, with supervisionA. SLP Short Term Goal 2 (Week 1): SLP will determine if objective swallow study is warranted in light of patient's ACDF surgery and early c/o dysphagia.  Skilled Therapeutic Interventions: Pt seen for skilled ST with focus on swallowing goals, pt in bed with no complaints of pain. Pt reports an increased cough at baseline following surgery, states it is more frequent at night. Spoke with RN who Ok'ed removal of cervical brace for PO trials. SLP facilitating regular trials by providing Supervision A cues for swallow precautions s/p ACDF. Pt consuming regular solids with no significant difficulty, s/s aspiration or pain reported, benefits from occ verbal cues to reduce bite size. Mastication was efficient for edentulous status, no significant oral stasis following any trials. Educated patient on ST POC, recommend trial of regular meal before potential upgrade and d/c from skilled services. Cervical collar placed on PT, bed alarm set and all needs within reach. Cont ST POC.   Pain Pain Assessment Pain Scale: 0-10 Pain Score: 0-No pain  Therapy/Group: Individual Therapy  Dewaine Conger 01/05/2021, 10:15 AM

## 2021-01-06 LAB — GLUCOSE, CAPILLARY
Glucose-Capillary: 87 mg/dL (ref 70–99)
Glucose-Capillary: 101 mg/dL — ABNORMAL HIGH (ref 70–99)
Glucose-Capillary: 168 mg/dL — ABNORMAL HIGH (ref 70–99)
Glucose-Capillary: 125 mg/dL — ABNORMAL HIGH (ref 70–99)
Glucose-Capillary: 110 mg/dL — ABNORMAL HIGH (ref 70–99)

## 2021-01-06 LAB — CULTURE, RESPIRATORY W GRAM STAIN
Culture: NORMAL
Special Requests: NORMAL

## 2021-01-06 NOTE — Progress Notes (Addendum)
[place] PHYSICAL MEDICINE & REHABILITATION PROGRESS NOTE  Subjective/Complaints: Patient seen laying in bed this morning.  He states he did not sleep well overnight because he was coughing.  He states it is only happens at night.  Encourage patient to raise objective bed tonight.  ROS: Denies CP, SOB, N/V/D  Objective: Vital Signs: Blood pressure 134/70, pulse 72, temperature 97.6 F (36.4 C), temperature source Oral, resp. rate 18, height '5\' 7"'$  (1.702 m), weight 83.2 kg, SpO2 94 %. No results found. Recent Labs    01/04/21 0511  WBC 10.0  HGB 11.5*  HCT 35.7*  PLT 248    Recent Labs    01/04/21 0511  NA 144  K 3.2*  CL 98  CO2 35*  GLUCOSE 91  BUN 12  CREATININE 0.73  CALCIUM 9.2     Intake/Output Summary (Last 24 hours) at 01/06/2021 0801 Last data filed at 01/06/2021 0407 Gross per 24 hour  Intake 832 ml  Output 1025 ml  Net -193 ml         Physical Exam: BP 134/70   Pulse 72   Temp 97.6 F (36.4 C) (Oral)   Resp 18   Ht '5\' 7"'$  (1.702 m)   Wt 83.2 kg   SpO2 94%   BMI 28.73 kg/m  Constitutional: No distress . Vital signs reviewed. HENT: Normocephalic.  Atraumatic. Edentulous Neck: + C-collar Eyes: EOMI. No discharge. Cardiovascular: No JVD.  Irregularly irregular. Respiratory: Normal effort.  No stridor.  Bilateral clear to auscultation.  GI: Non-distended.  BS +. Skin: Warm and dry.  Anterior neck dressing with dried blood. Psych: Normal mood.  Normal behavior. Musc: No edema in extremities.  No tenderness in extremities. Neuro: Alert and oriented Dysphonia Motor: Bilateral upper extremities: Shoulder abduction 2/5, elbow flexion/extension 3+/5, handgrip 4/5, right weaker than left Bilateral lower extremities: 4-/5 proximal distal  Assessment/Plan: 1. Functional deficits which require 3+ hours per day of interdisciplinary therapy in a comprehensive inpatient rehab setting. Physiatrist is providing close team supervision and 24 hour  management of active medical problems listed below. Physiatrist and rehab team continue to assess barriers to discharge/monitor patient progress toward functional and medical goals   Care Tool:  Bathing    Body parts bathed by patient: Right arm, Left arm, Chest, Abdomen, Front perineal area, Right upper leg, Left upper leg   Body parts bathed by helper: Face, Buttocks, Left lower leg, Right lower leg     Bathing assist Assist Level: Moderate Assistance - Patient 50 - 74%     Upper Body Dressing/Undressing Upper body dressing   What is the patient wearing?: Button up shirt    Upper body assist Assist Level: Maximal Assistance - Patient 25 - 49%    Lower Body Dressing/Undressing Lower body dressing      What is the patient wearing?: Incontinence brief, Pants     Lower body assist Assist for lower body dressing: Total Assistance - Patient < 25%     Toileting Toileting    Toileting assist Assist for toileting: Total Assistance - Patient < 25%     Transfers Chair/bed transfer  Transfers assist     Chair/bed transfer assist level: 2 Helpers     Locomotion Ambulation   Ambulation assist      Assist level: 2 helpers Assistive device: Walker-rolling Max distance: 25   Walk 10 feet activity   Assist     Assist level: 2 helpers Assistive device: Walker-rolling   Walk 50 feet activity  Assist Walk 50 feet with 2 turns activity did not occur: Safety/medical concerns         Walk 150 feet activity   Assist Walk 150 feet activity did not occur: Safety/medical concerns         Walk 10 feet on uneven surface  activity   Assist Walk 10 feet on uneven surfaces activity did not occur: Safety/medical concerns         Wheelchair     Assist Is the patient using a wheelchair?: Yes Type of Wheelchair: Manual    Wheelchair assist level: Contact Guard/Touching assist Max wheelchair distance: 60    Wheelchair 50 feet with 2 turns  activity    Assist    Wheelchair 50 feet with 2 turns activity did not occur: Safety/medical concerns   Assist Level: Contact Guard/Touching assist   Wheelchair 150 feet activity     Assist  Wheelchair 150 feet activity did not occur: Safety/medical concerns        Medical Problem List and Plan: 1.  Cervical myelopathy  Continue CIR 2.  Antithrombotics: -DVT/anticoagulation:  Pharmaceutical: Heparin--low dose. Was on Eliquis PTA--held due to surgery--> to resume on 9/23 per recs.             -antiplatelet therapy: N/A 3. Endstate OA left hip/Pain Management: Oxycodone prn             Added voltaren gel to left hip QID             Xray showing left hip OA  Controlled with meds on 9/18 Monitor with increased exertion 4. Mood: LCSW to follow for evaluation and support.              -antipsychotic agents: N/A 5. Neuropsych: This patient is capable of making decisions on his own behalf. 6. Skin/Wound Care:  Routine pressure relief measures--monitor incision for healing.              --D/ced bacitracin ointement Added Aquacell with dry dressing to bilateral knee abrasions.  7. Fluids/Electrolytes/Nutrition: Monitor I/Os.  8. CAF: Monitor HR TID--continue to hold eliquis till 9/23             --On coreg BID.  Filed Weights   01/03/21 1653  Weight: 83.2 kg  9.  Dehydration: Encourage fluid intake. Monitor orthostatic symptoms 10. Hypoxia/ Productive cough: Will down grade diet to dysphagia 3 with aspiration precautions/supervison             --has been cleared to remove collar for meals.              --CXR showing atelectasis  Wean supplemental oxygen as tolerated 11.  Prediabetes diet controlled?: Has been off meds for 5 years.  Hemoglobin A1c 5.7 on 9/16 Relatively controlled on 9/18 Monitor with increased mobility 12. HTN: Monitor BP TID--continue Lasix and Cozaar. BP currently soft but as expected for SCI  Labile on 9/17, orthostatics negative  Monitor with  increased mobility 13.  Macrocytic anemia:   Vitamin B12 elevated on 7/18  Folate within normal limits on 9/16 Hemoglobin 11.5 on 9/16 14. OSA: Does not use CPAP. 15. Constipation: Has not had BM for a couple of days             Added Senna S.   MiraLAX started on 9/16 16. Overweight BMI 28.82: provide dietary education 17.  Hypokalemia  Potassium 3.2 on 9/16, labs ordered for tomorrow  Supplemented increased on 9/16 18.  Hypoalbuminemia  Supplement initiated on  9/16  LOS: 3 days A FACE TO FACE EVALUATION WAS PERFORMED  Tessa Seaberry Lorie Phenix 01/06/2021, 8:01 AM

## 2021-01-06 NOTE — Progress Notes (Signed)
   01/06/21 1413  Clinical Encounter Type  Visited With Family  Visit Type Initial   Chaplain ran into patient's 'girlfriend' in the hallways, and she needed help finding the patient. Chaplain assisted with this. Chaplain introduced spiritual care services. Spiritual care services available as needed.   Jeri Lager, Chaplain

## 2021-01-07 LAB — GLUCOSE, CAPILLARY
Glucose-Capillary: 106 mg/dL — ABNORMAL HIGH (ref 70–99)
Glucose-Capillary: 112 mg/dL — ABNORMAL HIGH (ref 70–99)
Glucose-Capillary: 188 mg/dL — ABNORMAL HIGH (ref 70–99)
Glucose-Capillary: 128 mg/dL — ABNORMAL HIGH (ref 70–99)

## 2021-01-07 LAB — CBC
HCT: 35.3 % — ABNORMAL LOW (ref 39.0–52.0)
Hemoglobin: 11.5 g/dL — ABNORMAL LOW (ref 13.0–17.0)
MCH: 34.1 pg — ABNORMAL HIGH (ref 26.0–34.0)
MCHC: 32.6 g/dL (ref 30.0–36.0)
MCV: 104.7 fL — ABNORMAL HIGH (ref 80.0–100.0)
Platelets: 304 10*3/uL (ref 150–400)
RBC: 3.37 MIL/uL — ABNORMAL LOW (ref 4.22–5.81)
RDW: 14.5 % (ref 11.5–15.5)
WBC: 8.3 10*3/uL (ref 4.0–10.5)
nRBC: 0 % (ref 0.0–0.2)

## 2021-01-07 LAB — BASIC METABOLIC PANEL
Anion gap: 8 (ref 5–15)
BUN: 14 mg/dL (ref 8–23)
CO2: 32 mmol/L (ref 22–32)
Calcium: 8.9 mg/dL (ref 8.9–10.3)
Chloride: 100 mmol/L (ref 98–111)
Creatinine, Ser: 0.67 mg/dL (ref 0.61–1.24)
GFR, Estimated: >60 mL/min (ref >60)
Glucose, Bld: 97 mg/dL (ref 70–99)
Potassium: 3.9 mmol/L (ref 3.5–5.1)
Sodium: 140 mmol/L (ref 135–145)

## 2021-01-07 LAB — VITAMIN B12: Vitamin B-12: 489 pg/mL (ref 180–914)

## 2021-01-07 NOTE — Progress Notes (Signed)
[facility] PHYSICAL MEDICINE & REHABILITATION PROGRESS NOTE  Subjective/Complaints:   Cough at noc , but no SOB, at one time used CPAP but machine broke per pt Also was on oral B12 but this was discontinued by "heart Dr."  ROS: Denies CP, SOB, N/V/D  Objective: Vital Signs: Blood pressure 132/82, pulse 77, temperature 97.9 F (36.6 C), resp. rate 20, height '5\' 7"'$  (1.702 m), weight 83.2 kg, SpO2 94 %. No results found. Recent Labs    01/07/21 0607  WBC 8.3  HGB 11.5*  HCT 35.3*  PLT 304    Recent Labs    01/07/21 0607  NA 140  K 3.9  CL 100  CO2 32  GLUCOSE 97  BUN 14  CREATININE 0.67  CALCIUM 8.9     Intake/Output Summary (Last 24 hours) at 01/07/2021 1040 Last data filed at 01/07/2021 1030 Gross per 24 hour  Intake 948 ml  Output 1375 ml  Net -427 ml         Physical Exam: BP 132/82 (BP Location: Right Arm)   Pulse 77   Temp 97.9 F (36.6 C)   Resp 20   Ht '5\' 7"'$  (1.702 m)   Wt 83.2 kg   SpO2 94%   BMI 28.73 kg/m  Constitutional: No distress . Vital signs reviewed. Mood and affect are appropriate Heart: Regular rate and rhythm no rubs murmurs or extra sounds Lungs: Clear to auscultation, breathing unlabored, no rales or wheezes Abdomen: Positive bowel sounds, soft nontender to palpation, nondistended Extremities: No clubbing, cyanosis, or edema Skin: No evidence of breakdown, no evidence of rash   Dysphonia Motor: Bilateral upper extremities: Shoulder abduction 2/5, elbow flexion/extension 3+/5, handgrip 4/5, right weaker than left Bilateral lower extremities: 4-/5 proximal distal Sensation intact to LT BUE and BLE  Assessment/Plan: 1. Functional deficits which require 3+ hours per day of interdisciplinary therapy in a comprehensive inpatient rehab setting. Physiatrist is providing close team supervision and 24 hour management of active medical problems listed below. Physiatrist and rehab team continue to assess barriers to discharge/monitor  patient progress toward functional and medical goals   Care Tool:  Bathing    Body parts bathed by patient: Right arm, Left arm, Chest, Abdomen, Front perineal area, Right upper leg, Left upper leg   Body parts bathed by helper: Face, Buttocks, Left lower leg, Right lower leg     Bathing assist Assist Level: Moderate Assistance - Patient 50 - 74%     Upper Body Dressing/Undressing Upper body dressing   What is the patient wearing?: Button up shirt    Upper body assist Assist Level: Maximal Assistance - Patient 25 - 49%    Lower Body Dressing/Undressing Lower body dressing      What is the patient wearing?: Incontinence brief, Pants     Lower body assist Assist for lower body dressing: Total Assistance - Patient < 25%     Toileting Toileting    Toileting assist Assist for toileting: Total Assistance - Patient < 25%     Transfers Chair/bed transfer  Transfers assist     Chair/bed transfer assist level: 2 Helpers     Locomotion Ambulation   Ambulation assist      Assist level: 2 helpers Assistive device: Walker-rolling Max distance: 25   Walk 10 feet activity   Assist     Assist level: 2 helpers Assistive device: Walker-rolling   Walk 50 feet activity   Assist Walk 50 feet with 2 turns activity did not occur: Safety/medical  concerns         Walk 150 feet activity   Assist Walk 150 feet activity did not occur: Safety/medical concerns         Walk 10 feet on uneven surface  activity   Assist Walk 10 feet on uneven surfaces activity did not occur: Safety/medical concerns         Wheelchair     Assist Is the patient using a wheelchair?: Yes Type of Wheelchair: Manual    Wheelchair assist level: Contact Guard/Touching assist Max wheelchair distance: 60    Wheelchair 50 feet with 2 turns activity    Assist    Wheelchair 50 feet with 2 turns activity did not occur: Safety/medical concerns   Assist Level:  Contact Guard/Touching assist   Wheelchair 150 feet activity     Assist  Wheelchair 150 feet activity did not occur: Safety/medical concerns        Medical Problem List and Plan: 1.  Cervical myelopathy  Continue CIR PT, OT team conf in am  2.  Antithrombotics: -DVT/anticoagulation:  Pharmaceutical: Heparin--low dose. Was on Eliquis PTA--held due to surgery--> to resume on 9/23 per recs.             -antiplatelet therapy: N/A 3. Endstate OA left hip/Pain Management: Oxycodone prn             Added voltaren gel to left hip QID             Xray showing left hip OA  Controlled with meds on 9/18 Monitor with increased exertion 4. Mood: LCSW to follow for evaluation and support.              -antipsychotic agents: N/A 5. Neuropsych: This patient is capable of making decisions on his own behalf. 6. Skin/Wound Care:  Routine pressure relief measures--monitor incision for healing.              --D/ced bacitracin ointement Added Aquacell with dry dressing to bilateral knee abrasions.  7. Fluids/Electrolytes/Nutrition: Monitor I/Os.  8. CAF: Monitor HR TID--continue to hold eliquis till 9/23             --On coreg BID.  Filed Weights   01/03/21 1653  Weight: 83.2 kg  9.  Dehydration: Encourage fluid intake. Monitor orthostatic symptoms 10. Hypoxia/ Productive cough: Will down grade diet to dysphagia 3 with aspiration precautions/supervison             --has been cleared to remove collar for meals.              --CXR showing atelectasis  Wean supplemental oxygen as tolerated 11.  Prediabetes diet controlled?: Has been off meds for 5 years.  Hemoglobin A1c 5.7 on 9/16 Relatively controlled on 9/18 Monitor with increased mobility 12. HTN: Monitor BP TID--continue Lasix and Cozaar. BP currently soft but as expected for SCI  Labile on 9/17, orthostatics negative  Monitor with increased mobility 13.  Macrocytic anemia:   Vitamin B12 elevated on 7/18- pt may have been on  supplementation at that time will repeat   Folate within normal limits on 9/16 Hemoglobin 11.5 on 9/16, stable 9/19 14. OSA: Does not use CPAP. 15. Constipation: Has not had BM for a couple of days             Added Senna S.   MiraLAX started on 9/16 16. Overweight BMI 28.82: provide dietary education 17.  Hypokalemia  Potassium 3.2 on 9/16,   Supplemented increased on 9/16, 3.9  on 9/19 cont current dose  18.  Hypoalbuminemia  Supplement initiated on 9/16  LOS: 4 days A FACE TO FACE EVALUATION WAS PERFORMED  Charlett Blake 01/07/2021, 10:40 AM

## 2021-01-07 NOTE — Progress Notes (Signed)
Orthopedic Tech Progress Note Patient Details:  Logan [name] 1945/06/04 [id number]  Spoke with RN about patient COLLAR. Showed/talked her how to make collar shorter. Told her the next smallest collar in [employer] brace is a peds size collar.   Patient ID: Logan [name], male   DOB: 03/12/1946, 75 y.o.   MRN: [id number]  Logan [name] 01/07/2021, 10:55 AM

## 2021-01-07 NOTE — Progress Notes (Signed)
Speech Language Pathology Discharge Summary  Patient Details  Name: Logan [name] MRN: [id number] Date of Birth: Nov 08, 1945  Today's Date: 01/07/2021 SLP Individual Time: 7026-3785 SLP Individual Time Calculation (min): 42 min   Skilled Therapeutic Interventions:  Skilled ST services focused on swallow skills and education. Pt's son and daughter-in-law present for education. Pt consumed regular texture and thin liquids via straw trial lunch tray. Pt demonstrated recall of cervical precautions and how to notify staff to remove collar during meals as well as replace at the end of the meal. Pt demonstrated appropriate mastication (despite edentulous status), oral clearance, timely swallow and no overt s/s aspiration. Pt reported no sensation of items feeling "stuck" and consumed appropriate size bites/sips mod I. SLP reviewed vital sign no s/s of infection, oxygen and tempeture have remained WFL, no evidence of need for MBS to assess silent aspiration. SLP provided education to family and pt pertaining to cervical precautions, avoiding uncooked vegetables and fruits (due to edentulous status and possible pharyngeal edema from surgery) small bites/sips, alternating solids/liquids, cease verbalization during mastication and remaining upright for 30 minutes following PO intake. All questions answered to satisfaction. Pt was left in room with family and no further ST services needed.     Patient has met 2 of 2 long term goals.  Patient to discharge at overall Modified Independent level.  Reasons goals not met:     Clinical Impression/Discharge Summary:   Pt made great progress meeting 2 out 2 goals. Pt is able to follow swallow strategies mod I on least restrictive diet, regular textures and thin liquids. Vitals s/s have remained WFL during CIR length of stay and no s/s aspiration. Therefore no instrumental swallow assessment is indicated. Education was completed with pt, staff and family. No further ST  services are required at this time.   Care Partner:  Caregiver Able to Provide Assistance: Yes  Type of Caregiver Assistance: Physical  Recommendation:  None      Equipment: N/A   Reasons for discharge: Treatment goals met   Patient/Family Agrees with Progress Made and Goals Achieved: Yes    Keghan Mcfarren 01/07/2021, 1:31 PM

## 2021-01-07 NOTE — Progress Notes (Signed)
Occupational Therapy Session Note  Patient Details  Name: Logan [name] MRN: [id number] Date of Birth: [birth year]  Today's Date: 01/07/2021 OT Individual Time: [id number] and 1100-1130 OT Individual Time Calculation (min): 58 min and 30 min   Short Term Goals: Week 1:  OT Short Term Goal 1 (Week 1): Pt will complete sit<>stand transfers using LRAD with CGA in preperation for dressing and toileting OT Short Term Goal 2 (Week 1): Pt will complete UB dressing with min assist OT Short Term Goal 3 (Week 1): Pt will increased right shoulder scaption to 50 degrees for functional reaching tasks. OT Short Term Goal 4 (Week 1): Pt will donn shorts using reacher with min assist.   Skilled Therapeutic Interventions/Progress Updates:    Session 1: Pt greeted at time of session sitting up in wheelchair agreeable to OT session, no pain throughout session. RN present at beginning of session for med pass. Pt wanting to shave this am, stating beard was irritating under collar. Stand pivot wheelchair > bed with Mod A and cues for widened BOS, also limited by LE discrepancy/feeling off balance. Sit > supine Max A and in supine therapist doffing front part of collar and assisting with shaving in supine with head supported. Educated throughout on cervical precautions and tasks that may need to be performed in supine for collar to be off. Reapplied anterior aspect of collar and supine > sit Mod/Max with cues for sequencing log roll and pushing through RUE (limited by weakness). Stand pivot bed > wheelchair Mod A. Up in chair alarm on call bell in reach.    Session 2: Pt greeted at time of session sitting up in wheelchair, no pain, agreeable to OT session. Pt wanting to go outside, transported room <> outside for time management and pt assisting with self propelling wheelchair part of distance for BUE there ex and NMR with Supervision for short distance. Outside, focused on RUE NMR/AROM with shoulder flexion/extension,  elbow extension with resistance, and push/pulls with minimal resistance for cervical precautions, all 1x12-15 reps. Educated on continuing ROM outside of therapy sessions for max carryover. Needing to urinate, sit > stand Min/Mod at [facility] and therapist assist doffing clothing and holding urinal, pt able to help with pulling pants back over hips on L side. Alarm on call bell in reach.   Therapy Documentation Precautions:  Precautions Precautions: Fall, Cervical Required Braces or Orthoses: Cervical Brace Cervical Brace: Hard collar, At all times, Other (comment)     Therapy/Group: Individual Therapy  Viona Gilmore 01/07/2021, 7:18 AM

## 2021-01-07 NOTE — Progress Notes (Signed)
Physical Therapy Session Note  Patient Details  Name: Logan [name] MRN: [id number] Date of Birth: 02/24/1946  Today's Date: 01/07/2021 PT Individual Time: 1100-1130 1345-1456 PT Individual Time Calculation (min): 30 min, 71 min   Short Term Goals: Week 1:  PT Short Term Goal 1 (Week 1): Pt will perform supine to sit w/mod assist and LRAD PT Short Term Goal 2 (Week 1): Pt will transfer bed to/from wc w/mod assist and LRAD PT Short Term Goal 3 (Week 1): Pt will ambulate 37f w/min assist and LRAD  Skilled Therapeutic Interventions/Progress Updates:    Session 1: Pt seated in w/c on arrival and agreeable to therapy. No complaint of pain. Session focused on gait training for endurance and LE strength. Pt ambulated 4 x 35-40 ft with RW and CGA. Pt demoed vaulting gait pattern d/t leg length discrepancy. Pt fatigues quickly and required extended rest breaks. Pt returned to room after session and requested to use urinal. Sit to stand with CGA, tot A for clothing management and urinal placement. Pt remained in w/c at end of session and was left with all needs in reach and alarm active.   Session 2: Pt seated in w/c on arrival and agreeable to therapy. Pt transported to therapy gym for time management and energy conservation. Stand pivot transfer to nustep with min A and RW. Pt used nustep at level 3, 4 x 2 min with seated rest break at ~60 spm, reported RPE 13/20. Some discomfort noted, pt reported "soreness" in R shoulder, addressed with rest breaks. Pt requested to use the bathroom. ambulatory transfer to commode, pt then urinated in standing with mod A for balance with UUE support, min A for for 3/3 toileting tasks. Pt then directed in step taps on 6" step for improved balance, strength, and coordination. Pt was unable to clear R from floor, progressing to being able to lift RLE 2-3" from ground with repetition. Gait x 60 ft, for endurance and LE strength pt demoes same gait deviations as previous  session. Pt remains in w/c at end of session and was left with all needs in reach and alarm active.   Therapy Documentation Precautions:  Precautions Precautions: Fall, Cervical Required Braces or Orthoses: Cervical Brace Cervical Brace: Hard collar, At all times, Other (comment)     Therapy/Group: Individual Therapy  OMickel Fuchs9/19/2022, 2:20 PM

## 2021-01-08 DIAGNOSIS — I482 Chronic atrial fibrillation, unspecified: Secondary | ICD-10-CM

## 2021-01-08 LAB — VITAMIN B1: Vitamin B1 (Thiamine): 248 nmol/L — ABNORMAL HIGH (ref 66.5–200.0)

## 2021-01-08 LAB — GLUCOSE, CAPILLARY
Glucose-Capillary: 131 mg/dL — ABNORMAL HIGH (ref 70–99)
Glucose-Capillary: 106 mg/dL — ABNORMAL HIGH (ref 70–99)
Glucose-Capillary: 202 mg/dL — ABNORMAL HIGH (ref 70–99)
Glucose-Capillary: 127 mg/dL — ABNORMAL HIGH (ref 70–99)

## 2021-01-08 NOTE — Progress Notes (Signed)
Logan [name]  Subjective/Complaints: Patient seen sitting up in a chair this morning.  States he did not sleep well overnight because he was asked to pull himself up in bed and had some discomfort with his neck afterward.  ROS: Denies CP, SOB, N/V/D  Objective: Vital Signs: Blood pressure 124/60, pulse 69, temperature 98 F (36.7 C), temperature source Oral, resp. rate 18, height 5\' 7"  (1.702 m), weight 83.2 kg, SpO2 95 %. No results found. Recent Labs    01/07/21 0607  WBC 8.3  HGB 11.5*  HCT 35.3*  PLT 304    Recent Labs    01/07/21 0607  NA 140  K 3.9  CL 100  CO2 32  GLUCOSE 97  BUN 14  CREATININE 0.67  CALCIUM 8.9     Intake/Output Summary (Last 24 hours) at 01/08/2021 1145 Last data filed at 01/08/2021 0200 Gross per 24 hour  Intake 236 ml  Output 750 ml  Net -514 ml         Physical Exam: BP 124/60 (BP Location: Left Arm)   Pulse 69   Temp 98 F (36.7 C) (Oral)   Resp 18   Ht 5\' 7"  (1.702 m)   Wt 83.2 kg   SpO2 95%   BMI 28.73 kg/m  Constitutional: No distress . Vital signs reviewed. HENT: Normocephalic.  Atraumatic. Eyes: EOMI. No discharge. Cardiovascular: No JVD.  Irregularly irregular. Respiratory: Normal effort.  No stridor.  Bilateral clear to auscultation. GI: Non-distended.  BS +. Skin: Warm and dry.  Intact. Psych: Normal mood.  Normal behavior. Musc: No edema in extremities.  No tenderness in extremities. Neuro: Alert Dysphonia. stable Motor: Bilateral upper extremities: Shoulder abduction 2/5, elbow flexion/extension 3+/5, handgrip 4/5, right weaker than left, unchanged Bilateral lower extremities: 4-/5 proximal distal  Assessment/Plan: 1. Functional deficits which require 3+ hours per day of interdisciplinary therapy in a comprehensive inpatient rehab setting. Physiatrist is providing close team supervision and 24 hour management of active medical problems listed  below. Physiatrist and rehab team continue to assess barriers to discharge/monitor patient progress toward functional and medical goals   Care Tool:  Bathing    Body parts bathed by patient: Right arm, Left arm, Chest, Abdomen, Front perineal area, Right upper leg, Left upper leg   Body parts bathed by helper: Face, Buttocks, Left lower leg, Right lower leg     Bathing assist Assist Level: Moderate Assistance - Patient 50 - 74%     Upper Body Dressing/Undressing Upper body dressing   What is the patient wearing?: Button up shirt    Upper body assist Assist Level: Maximal Assistance - Patient 25 - 49%    Lower Body Dressing/Undressing Lower body dressing      What is the patient wearing?: Pants     Lower body assist Assist for lower body dressing: Moderate Assistance - Patient 50 - 74%     Toileting Toileting    Toileting assist Assist for toileting: Total Assistance - Patient < 25%     Transfers Chair/bed transfer  Transfers assist     Chair/bed transfer assist level: 2 Helpers     Locomotion Ambulation   Ambulation assist      Assist level: 2 helpers Assistive device: Walker-rolling Max distance: 25   Walk 10 feet activity   Assist     Assist level: 2 helpers Assistive device: Walker-rolling   Walk 50 feet activity   Assist Walk 50 feet with 2 turns activity  did not occur: Safety/medical concerns         Walk 150 feet activity   Assist Walk 150 feet activity did not occur: Safety/medical concerns         Walk 10 feet on uneven surface  activity   Assist Walk 10 feet on uneven surfaces activity did not occur: Safety/medical concerns         Wheelchair     Assist Is the patient using a wheelchair?: Yes Type of Wheelchair: Manual    Wheelchair assist level: Contact Guard/Touching assist Max wheelchair distance: 60    Wheelchair 50 feet with 2 turns activity    Assist        Assist Level: Contact  Guard/Touching assist   Wheelchair 150 feet activity     Assist  Wheelchair 150 feet activity did not occur: Safety/medical concerns   Assist Level: Total Assistance - Patient < 25%    Medical Problem List and Plan: 1.  Cervical myelopathy  Continue CIR  Team conference today to discuss current and goals and coordination of care, home and environmental barriers, and discharge planning with nursing, case manager, and therapies. Please see conference [name] from today as well.  2.  Antithrombotics: -DVT/anticoagulation:  Pharmaceutical: Heparin--low dose. Was on Eliquis PTA--held due to surgery--> to resume on 9/23 per recs.             -antiplatelet therapy: N/A 3. Endstate OA left hip/Pain Management: Oxycodone prn             Added voltaren gel to left hip QID             Xray showing left hip OA  Controlled with meds on 9/20 Monitor with increased exertion 4. Mood: LCSW to follow for evaluation and support.              -antipsychotic agents: N/A 5. Neuropsych: This patient is capable of making decisions on his own behalf. 6. Skin/Wound Care:  Routine pressure relief measures--monitor incision for healing.              --D/ced bacitracin ointement Added Aquacell with dry dressing to bilateral knee abrasions.  7. Fluids/Electrolytes/Nutrition: Monitor I/Os.  8. CAF: Monitor HR TID--continue to hold eliquis till 9/23             --On coreg BID.  Filed Weights   01/03/21 1653  Weight: 83.2 kg  9.  Dehydration: Encourage fluid intake. Monitor orthostatic symptoms 10. Hypoxia/ Productive cough: Will down grade diet to dysphagia 3 with aspiration precautions/supervison             --has been cleared to remove collar for meals.              --CXR showing atelectasis  Appears to have been weaned off supplemental oxygen 11.  Prediabetes diet controlled?: Has been off meds for 5 years.  Hemoglobin A1c 5.7 on 9/16 Slightly labile on 9/20, but relatively controlled Monitor with  increased mobility 12. HTN: Monitor BP TID--continue Lasix and Cozaar. BP currently soft but as expected for SCI  Controlled on 9/20  Monitor with increased mobility 13.  Macrocytic anemia:   Vitamin B12 elevated on 7/18- pt may have been on supplementation at that time will repeat   Folate within normal limits on 9/16 Hemoglobin 11.5 on 9/19 14. OSA: Does not use CPAP. 15. Constipation: Has not had BM for a couple of days             Added  Senna S.   MiraLAX started on 9/16 16. Overweight BMI 28.82: provide dietary education 17.  Hypokalemia  Potassium 3.9 on 9/19, plan to repeat labs at the end of this week  Supplemented increased on 9/16  18.  Hypoalbuminemia  Supplement initiated on 9/16  LOS: 5 days A FACE TO FACE EVALUATION WAS PERFORMED  Logan [name] Logan [name] 01/08/2021, 11:45 AM

## 2021-01-08 NOTE — Progress Notes (Signed)
Physical Therapy Session Note  Patient Details  Name: Logan [name] MRN: [id number] Date of Birth: [birth year]  Today's Date: 01/08/2021 PT Individual Time: 1000-1100 PT Individual Time Calculation (min): 60 min   Short Term Goals: Week 1:  PT Short Term Goal 1 (Week 1): Pt will perform supine to sit w/mod assist and LRAD PT Short Term Goal 2 (Week 1): Pt will transfer bed to/from wc w/mod assist and LRAD PT Short Term Goal 3 (Week 1): Pt will ambulate 62ft w/min assist and LRAD  Skilled Therapeutic Interventions/Progress Updates:    Pt seated in w/c on arrival and agreeable to therapy. No complaint of pain. Pt propelled w/c with BUE ~300 ft, including navigating elevator for improved UE strength and endurance. Pt directed in 4 x 10 step taps on 3 " step with BUE support. Pt then requested to use the bathroom. Pt had continent bladder void in standing but was unable to aim because of cervical collar. Max A for clothing management d/t poor balance. Noted bowel incontinence in brief during toileting, pt was transported back to room. Standing  dependent brief change with RW, CGA for balance. Nsg assisted to change soiled dressing on buttocks. Pt requested to returned to bed after session, Stand pivot transfer with min A, mod A for bed mobility. Pt remained in bed after session and was left with all needs in reach and alarm active.   Therapy Documentation Precautions:  Precautions Precautions: Fall, Cervical Required Braces or Orthoses: Cervical Brace Cervical Brace: Hard collar, At all times, Other (comment)    Therapy/Group: Individual Therapy  Mickel Fuchs 01/08/2021, 12:52 PM

## 2021-01-08 NOTE — Progress Notes (Signed)
Occupational Therapy Session Note  Patient Details  Name: Logan [name] MRN: [id number] Date of Birth: 1946-02-13  Today's Date: 01/08/2021 OT Individual Time: 1302-1400 OT Individual Time Calculation (min): 58 min    Short Term Goals: Week 1:  OT Short Term Goal 1 (Week 1): Pt will complete sit<>stand transfers using LRAD with CGA in preperation for dressing and toileting OT Short Term Goal 2 (Week 1): Pt will complete UB dressing with min assist OT Short Term Goal 3 (Week 1): Pt will increased right shoulder scaption to 50 degrees for functional reaching tasks. OT Short Term Goal 4 (Week 1): Pt will donn shorts using reacher with min assist.  Skilled Therapeutic Interventions/Progress Updates:    Pt greeted seated in wc and agreeable to OT treatment session. Pt declined need to go to the bathroom at this time. Pt brought down to therapy gym and was issued red theraputty. Worked on Product/process development scientist, pinch, and grip with putty exercises. Progressed to focus on proximal shoulder strength with rolling ball forward and back on table to hit different colored bean bags. Completed with B UE's.. Sit<>stand at [facility] table with mod A and facilitation to widen base of support and encourage equal weight shift on B LE's. While standing, worked on shoulder mobility to glide towel across table. Pt returned to room in wc and left seated with alarm belt on, call bell in reach, and needs met.   Therapy Documentation Precautions:  Precautions Precautions: Fall, Cervical Required Braces or Orthoses: Cervical Brace Cervical Brace: Hard collar, At all times, Other (comment) Pain:  Denies pain   Therapy/Group: Individual Therapy  Valma Cava 01/08/2021, 3:34 PM

## 2021-01-08 NOTE — Patient Care Conference (Signed)
Inpatient RehabilitationTeam Conference and Plan of Care Update Date: 01/08/2021   Time: 11:44 AM    Patient Name: Logan [name]      Medical Record Number: [id number]  Date of Birth: 1945/11/14 Sex: Male         Room/Bed: 8K99I/3J82N-05 Payor Info: Payor: [employer] MEDICARE / Plan: [facility] HMO / Product Type: *No Product type* /    Admit Date/Time:  01/03/2021  4:40 PM  Primary Diagnosis:  Cervical disc disease with myelopathy  Hospital Problems: Principal Problem:   Cervical disc disease with myelopathy Active Problems:   Hypoxia   Supplemental oxygen dependent   Hypoalbuminemia due to protein-calorie malnutrition (HCC)   Hypokalemia   Macrocytic anemia   Prediabetes    Expected Discharge Date: Expected Discharge Date: 01/25/21  Team Members Present: Physician leading conference: Dr. Delice Lesch Social Worker Present: Ovidio Kin, LCSW Nurse Present: Dorthula Nettles, RN PT Present: Ailene Rud, PT OT Present: Lillia Corporal, OT PPS Coordinator present : Gunnar Fusi, SLP     Current Status/Progress Goal Weekly Team Focus  Bowel/Bladder   Pt is continent bowel/bladder  Pt will remain continent of bowel/bladder  Will assess qshift and PRN   Swallow/Nutrition/ Hydration   regular textures and thin liquids, mod I education completed  Mod I - goal met d/c 9/19      ADL's   sit > stands Mod, stand pivots Mod A w/ RW, LB bathing Mod A, RUE < LUE ROM especially at shoulder, Max UB dress, total LB dress and toileting  Mod I, lives alone  ADL retraining, AE training PRN, sit <> stands, standing balance/tolerance, BUE NMR (R>L), global endurance, transfers   Mobility   CGA-min STS, Gait up to 60 ft with RW, poor safety awareness at times  mod I overall  LE strength, gait, endurance   Communication             Safety/Cognition/ Behavioral Observations            Pain   Pt is currently pain free  Pt will remain pain free  Will assess qshift and PRN   Skin   Pt has  incision on right anterior neck  Pt's incision will heal  Will assess qshift and PRN     Discharge Planning:  Home alone with intermittent assist from son's who will come by and check on daily. Will need to be mod/i tpo be safe home alone   Team Discussion: Pain over the weekend. A-fib and will restart Eliquis later this week. Continent B/B, LBM 9/19. Tylenol for pain, Trazodone for sleep. Lacerations clean and appropriate dressings applied. Nursing educating on diabetes, need for cervical collar, and now weaned off O2. Current barriers are urinary urgency, and cough at night. MD and nursing instructed patient to elevate HOB. Discharging home alon, sons to come over and check on him. Min/mod assist with ADL's, mod/max assist with lower body. Collar limits ability to see lower body and finger dexterity is limited. Mobility is good. Mod I goals. Contact guard/min assist STS, walking 60 ft with RW. Working on stairs, fatigues quickly.  Patient on target to meet rehab goals: yes  *See Care Plan and progress notes for long and short-term goals.   Revisions to Treatment Plan:  Will restart Eliquis this week.  Teaching Needs: Family education, medication management, pain management, skin/wound care, transfer training, gait training, stair training, balance training, endurance training, safety awareness.  Current Barriers to Discharge: Decreased caregiver support, Medical stability, Home enviroment  access/layout, Wound care, Lack of/limited family support, Weight, Weight bearing restrictions, and Medication compliance  Possible Resolutions to Barriers: Continue current medications. Continue diabetes education, elevate HOB to reduce coughing at night.     Medical Summary Current Status: Cervical myelopathy with deficits with pain, mobility  Barriers to Discharge: Medical stability   Possible Resolutions to Barriers/Weekly Focus: Therapies, optimzie BP meds, follow labs - Hb, K+   Continued Need  for Acute Rehabilitation Level of Care: The patient requires daily medical management by a physician with specialized training in physical medicine and rehabilitation for the following reasons: Direction of a multidisciplinary physical rehabilitation program to maximize functional independence : Yes Medical management of patient stability for increased activity during participation in an intensive rehabilitation regime.: Yes Analysis of laboratory values and/or radiology reports with any subsequent need for medication adjustment and/or medical intervention. : Yes   I attest that I was present, lead the team conference, and concur with the assessment and plan of the team.   Cristi Loron 01/08/2021, 5:16 PM

## 2021-01-08 NOTE — Progress Notes (Signed)
Patient ID: Logan [name], male   DOB: Sep 05, 1945, 75 y.o.   MRN: [id number]  Met with pt to discuss team conference goals of mod/I level and target discharge date of 10/7. He feels by them he will be doing well, he will do whatever is asked of him to get well and recover. He is having a better day and knows to ask for his sleeping pill at 8:00 or 9:00. Will continue to work on discharge needs.

## 2021-01-08 NOTE — Progress Notes (Signed)
Occupational Therapy Session Note  Patient Details  Name: Logan [name] MRN: [id number] Date of Birth: November 13, 1945  Today's Date: 01/08/2021 OT Individual Time: 9242-6834 OT Individual Time Calculation (min): 58 min    Short Term Goals: Week 1:  OT Short Term Goal 1 (Week 1): Pt will complete sit<>stand transfers using LRAD with CGA in preperation for dressing and toileting OT Short Term Goal 2 (Week 1): Pt will complete UB dressing with min assist OT Short Term Goal 3 (Week 1): Pt will increased right shoulder scaption to 50 degrees for functional reaching tasks. OT Short Term Goal 4 (Week 1): Pt will donn shorts using reacher with min assist.   Skilled Therapeutic Interventions/Progress Updates:    Pt greeted at time of session supine in bed sleeping and easily woken with verbal stimuli, stating he had "a very bad night" stating nursing overnight had him pull with BUEs to scoot up in bed, making neck hurt and not sleep well. Asked if pt wanted to relay to management, stating he would if this happened again. Reviewed cervical precautions and pt verbalized understanding. Supine > sit Mod A with log roll and stand pivot bed > wheelchair Min A with RW. Pt had not eaten breakfast, set up food items for cutting meat, preparing drinks, etc and Supervision to eat as pt was easily distracted and needed cues for cervical precautions. Spoke with SLP later who recommended set up only as pt gets distracted with additional staff in room. Collar removed for eating as per instructions. After eating, UB dress Max A for button up shirt, and Mod/Max for pants as pt able to thread loose fitting shorts and sit > stand to don over hips. Pt needing to use urinal in standing, clothing management Mod A and assist to hold urinal in standing. Back in wheelchair alarm on call bell in reach.    Therapy Documentation Precautions:  Precautions Precautions: Fall, Cervical Required Braces or Orthoses: Cervical Brace Cervical  Brace: Hard collar, At all times, Other (comment)    Therapy/Group: Individual Therapy  Viona Gilmore 01/08/2021, 7:15 AM

## 2021-01-09 LAB — GLUCOSE, CAPILLARY
Glucose-Capillary: 120 mg/dL — ABNORMAL HIGH (ref 70–99)
Glucose-Capillary: 120 mg/dL — ABNORMAL HIGH (ref 70–99)
Glucose-Capillary: 114 mg/dL — ABNORMAL HIGH (ref 70–99)
Glucose-Capillary: 109 mg/dL — ABNORMAL HIGH (ref 70–99)

## 2021-01-09 NOTE — Progress Notes (Signed)
Physical Therapy Session Note  Patient Details  Name: Logan [name] MRN: [id number] Date of Birth: 07/28/1945  Today's Date: 01/09/2021 PT Individual Time: 1335-1445 PT Individual Time Calculation (min): 70 min   Short Term Goals: Week 1:  PT Short Term Goal 1 (Week 1): Pt will perform supine to sit w/mod assist and LRAD PT Short Term Goal 2 (Week 1): Pt will transfer bed to/from wc w/mod assist and LRAD PT Short Term Goal 3 (Week 1): Pt will ambulate 2ft w/min assist and LRAD  Skilled Therapeutic Interventions/Progress Updates: Pt presented in w/c agreeable to therapy. Pt states some mild soreness in R shoulder but did not rate. Rest breaks provided as needed thorughout session. Pt transported to rehab gym for time management. PTA tried various heel lifts with pt tolerating 1in lift. Pt performed x 3 short bouts ambulation ~66ft with with CGA. Pt continues to demonstrate R knee flexion, forward flexed posture however hips appeared more level. Pt transitioned to mat with last set of ambulation and participated in Sit to stand x 5 for BLE strengthening and PTA providing cues for increased anterior wt shifting vs bracing against mat. Pt also participated in toe taps to 3in step alternating 2 x 10 bilaterally for weight shifting and coordination. Pt initially with difficulty lifting RLE however improved with repetition. During seated rest pt participated in AA shoulder flexion with 1lb dowel and PTA assisting RUE. Pt also participated in ball taps with 1lb dowel x 20 with cues for elbow flexion/extension vs using accessory shoulder ms to elevate arm. Pt resumed gait activities with pt ambulating ~53ft x 1 with CGA and PTA providing multimodal cues for staying within RW particularly during turns. Pt then ambulated another 71ft weaving through cones with pt demonstrating increased lateral lean due to fatigue. Pt transported back to [facility] hallway and was able to ambulate an additional 38ft to pt's bathroom. Pt  noted to have small incontinent episode when performing last stand thus at toilet PTA assisted with doffing brief. Pt left at toilet with NT present to compete toileting needs.      Therapy Documentation Precautions:  Precautions Precautions: Fall, Cervical Required Braces or Orthoses: Cervical Brace Cervical Brace: Hard collar, At all times, Other (comment) General:   Vital Signs:  Pain:   Mobility:   Locomotion :    Trunk/Postural Assessment :    Balance:   Exercises:   Other Treatments:      Therapy/Group: Individual Therapy  Shateka Petrea 01/09/2021, 4:18 PM

## 2021-01-09 NOTE — Progress Notes (Signed)
Occupational Therapy Session Note  Patient Details  Name: Shafer Swamy MRN: [id number] Date of Birth: 1945/05/01  Today's Date: 01/09/2021 OT Individual Time: 5329-9242 OT Individual Time Calculation (min): 57 min    Short Term Goals: Week 1:  OT Short Term Goal 1 (Week 1): Pt will complete sit<>stand transfers using LRAD with CGA in preperation for dressing and toileting OT Short Term Goal 2 (Week 1): Pt will complete UB dressing with min assist OT Short Term Goal 3 (Week 1): Pt will increased right shoulder scaption to 50 degrees for functional reaching tasks. OT Short Term Goal 4 (Week 1): Pt will donn shorts using reacher with min assist.   Skilled Therapeutic Interventions/Progress Updates:    Pt greeted at time of session supine in bed sleeping, easily woken and agreeable to OT session. No pain noted. Pt pads on collar noted to be soiled with medicine, in supine therapist changing front pad and hand washing, educated pt on this technique for changing pads. Supine > sit Mod/Max and stand pivot bed > wheelchair > BSC over toilet with Min A overall. Note RW did not fit in bathroom well and used grab bars instead, extended time to manage feet in small space. Declined further ADL as he just put on clean clothes yesterday. Transported to gym total A for time and focused on BUE NMR for ball rolls 2x10 for shoulder flexion/extension, 1x10 ball tosses with Max A facilitation for gross grasp, and in standing 1 trial of towel slides for shoulder flexion/extension and abduction/adduction. Set up back in room to eat with collar off per instructions, NT aware to check on pt to reapply collar when done eating. Set up alarm on call bell in reach.     Therapy Documentation Precautions:  Precautions Precautions: Fall, Cervical Required Braces or Orthoses: Cervical Brace Cervical Brace: Hard collar, At all times, Other (comment)     Therapy/Group: Individual Therapy  Viona Gilmore 01/09/2021, 6:55  AM

## 2021-01-09 NOTE — Progress Notes (Signed)
[place] PHYSICAL MEDICINE & REHABILITATION PROGRESS NOTE  Subjective/Complaints: Patient seen sitting up this morning.  He states he slept well overnight after receiving his sleep aid at 9 PM.  He is in good spirits.  He states his cough at night improved with raising the head of the bed as well.  ROS: Denies CP, SOB, N/V/D  Objective: Vital Signs: Blood pressure (!) 118/44, pulse 78, temperature 98.1 F (36.7 C), temperature source Oral, resp. rate 18, height 5\' 7"  (1.702 m), weight 83.2 kg, SpO2 94 %. No results found. Recent Labs    01/07/21 0607  WBC 8.3  HGB 11.5*  HCT 35.3*  PLT 304    Recent Labs    01/07/21 0607  NA 140  K 3.9  CL 100  CO2 32  GLUCOSE 97  BUN 14  CREATININE 0.67  CALCIUM 8.9     Intake/Output Summary (Last 24 hours) at 01/09/2021 1119 Last data filed at 01/09/2021 0850 Gross per 24 hour  Intake 480 ml  Output 775 ml  Net -295 ml         Physical Exam: BP (!) 118/44   Pulse 78   Temp 98.1 F (36.7 C) (Oral)   Resp 18   Ht 5\' 7"  (1.702 m)   Wt 83.2 kg   SpO2 94%   BMI 28.73 kg/m  Constitutional: No distress . Vital signs reviewed. HENT: Normocephalic.  Atraumatic. Eyes: EOMI. No discharge. Cardiovascular: No JVD.  Irregularly irregular Respiratory: Normal effort.  No stridor.  Bilateral clear to auscultation. GI: Non-distended.  BS +. Skin: Warm and dry.  Intact. Psych: Normal mood.  Normal behavior. Musc: No edema in extremities.  No tenderness in extremities. Neuro: Alert Dysphonia, unchanged Motor: Bilateral upper extremities: Shoulder abduction 2/5, elbow flexion/extension 3+/5, handgrip 4/5, right weaker than left, stable Bilateral lower extremities: 4-/5 proximal distal  Assessment/Plan: 1. Functional deficits which require 3+ hours per day of interdisciplinary therapy in a comprehensive inpatient rehab setting. Physiatrist is providing close team supervision and 24 hour management of active medical problems  listed below. Physiatrist and rehab team continue to assess barriers to discharge/monitor patient progress toward functional and medical goals   Care Tool:  Bathing    Body parts bathed by patient: Right arm, Left arm, Chest, Abdomen, Front perineal area, Right upper leg, Left upper leg   Body parts bathed by helper: Face, Buttocks, Left lower leg, Right lower leg     Bathing assist Assist Level: Moderate Assistance - Patient 50 - 74%     Upper Body Dressing/Undressing Upper body dressing   What is the patient wearing?: Button up shirt    Upper body assist Assist Level: Maximal Assistance - Patient 25 - 49%    Lower Body Dressing/Undressing Lower body dressing      What is the patient wearing?: Pants     Lower body assist Assist for lower body dressing: Moderate Assistance - Patient 50 - 74%     Toileting Toileting    Toileting assist Assist for toileting: Total Assistance - Patient < 25%     Transfers Chair/bed transfer  Transfers assist     Chair/bed transfer assist level: 2 Helpers     Locomotion Ambulation   Ambulation assist      Assist level: 2 helpers Assistive device: Walker-rolling Max distance: 25   Walk 10 feet activity   Assist     Assist level: 2 helpers Assistive device: Walker-rolling   Walk 50 feet activity   Assist  Walk 50 feet with 2 turns activity did not occur: Safety/medical concerns         Walk 150 feet activity   Assist Walk 150 feet activity did not occur: Safety/medical concerns         Walk 10 feet on uneven surface  activity   Assist Walk 10 feet on uneven surfaces activity did not occur: Safety/medical concerns         Wheelchair     Assist Is the patient using a wheelchair?: Yes Type of Wheelchair: Manual    Wheelchair assist level: Contact Guard/Touching assist Max wheelchair distance: 60    Wheelchair 50 feet with 2 turns activity    Assist        Assist Level:  Contact Guard/Touching assist   Wheelchair 150 feet activity     Assist  Wheelchair 150 feet activity did not occur: Safety/medical concerns   Assist Level: Total Assistance - Patient < 25%    Medical Problem List and Plan: 1.  Cervical myelopathy  Continue CIR 2.  Antithrombotics: -DVT/anticoagulation:  Pharmaceutical: Heparin--low dose. Was on Eliquis PTA--held due to surgery--> to resume on 9/23 per recs.             -antiplatelet therapy: N/A 3. Endstate OA left hip/Pain Management: Oxycodone prn             Added voltaren gel to left hip QID             Xray showing left hip OA  Controlled with meds on 9/21 Monitor with increased exertion 4. Mood: LCSW to follow for evaluation and support.              -antipsychotic agents: N/A 5. Neuropsych: This patient is capable of making decisions on his own behalf. 6. Skin/Wound Care:  Routine pressure relief measures--monitor incision for healing.              --D/ced bacitracin ointement Added Aquacell with dry dressing to bilateral knee abrasions.  7. Fluids/Electrolytes/Nutrition: Monitor I/Os.  8. CAF: Monitor HR TID--continue to hold eliquis till 9/23             --On coreg BID.  Filed Weights   01/03/21 1653  Weight: 83.2 kg  9.  Dehydration: Encourage fluid intake. Monitor orthostatic symptoms 10. Hypoxia/ Productive cough: Will down grade diet to dysphagia 3 with aspiration precautions/supervison             --has been cleared to remove collar for meals.              --CXR showing atelectasis  Wean off supplemental oxygen 11.  Prediabetes diet controlled?: Has been off meds for 5 years.  Hemoglobin A1c 5.7 on 9/16 Relatively controlled on 9/21 Monitor with increased mobility 12. HTN: Monitor BP TID--continue Lasix and Cozaar. BP currently soft but as expected for SCI  Controlled on 9/21  Monitor with increased mobility 13.  Macrocytic anemia:   Folate within normal limits on 9/16 Hemoglobin 11.5 on 9/19 14. OSA:  Does not use CPAP. 15. Constipation: Has not had BM for a couple of days             Added Senna S.   MiraLAX started on 9/16 16. Overweight BMI 28.82: provide dietary education 17.  Hypokalemia  Potassium 3.9 on 9/19, plan to repeat labs on Friday  Supplemented increased on 9/16  18.  Hypoalbuminemia  Supplement initiated on 9/16  LOS: 6 days A FACE TO FACE EVALUATION WAS  PERFORMED  Namiko Pritts Lorie Phenix 01/09/2021, 11:19 AM

## 2021-01-09 NOTE — Progress Notes (Signed)
Physical Therapy Session Note  Patient Details  Name: Logan [name] MRN: [id number] Date of Birth: February 24, 1946  Today's Date: 01/09/2021 PT Individual Time: 0900-1000 PT Individual Time Calculation (min): 60 min   Short Term Goals: Week 1:  PT Short Term Goal 1 (Week 1): Pt will perform supine to sit w/mod assist and LRAD PT Short Term Goal 2 (Week 1): Pt will transfer bed to/from wc w/mod assist and LRAD PT Short Term Goal 3 (Week 1): Pt will ambulate 69ft w/min assist and LRAD  Skilled Therapeutic Interventions/Progress Updates:    Pt seated in w/c on arrival and agreeable to therapy. Pt c/o 2/10 pain in his hip, therapy to tolerance. Pt transported to therapy gym for time management and energy conservation. Upon standing, pt reported he had a bowel movement. Returned to pt room. Pt ambulated to toilet with RW and CGA, max cueing for walker proximity. Sit to stand with min A. Pt continued to void once seated on toilet. Doffed soiled brief and pants with assist and donned clean ones in the same manner. Dependent hygiene and clothing management. Nursing present for meds pass at this time. Pt performed Sit to stand x 5 for LE strength and endurance, now requiring only CGA. Gait 2 x 70 ft with seated rest break. Pt required frequent cueing for walker proximity and step length. Pt returned to room and remained seated in w/c, was left with all needs in reach and alarm active.   Therapy Documentation Precautions:  Precautions Precautions: Fall, Cervical Required Braces or Orthoses: Cervical Brace Cervical Brace: Hard collar, At all times, Other (comment)     Therapy/Group: Individual Therapy  Mickel Fuchs 01/09/2021, 7:10 PM

## 2021-01-10 LAB — GLUCOSE, CAPILLARY
Glucose-Capillary: 91 mg/dL (ref 70–99)
Glucose-Capillary: 106 mg/dL — ABNORMAL HIGH (ref 70–99)
Glucose-Capillary: 136 mg/dL — ABNORMAL HIGH (ref 70–99)
Glucose-Capillary: 124 mg/dL — ABNORMAL HIGH (ref 70–99)

## 2021-01-10 NOTE — Progress Notes (Signed)
Physical Therapy Session Note  Patient Details  Name: Logan [name] MRN: [id number] Date of Birth: 1945-11-29  Today's Date: 01/10/2021 PT Individual Time: 1415-1530 PT Individual Time Calculation (min): 75 min   Short Term Goals: Week 1:  PT Short Term Goal 1 (Week 1): Pt will perform supine to sit w/mod assist and LRAD PT Short Term Goal 2 (Week 1): Pt will transfer bed to/from wc w/mod assist and LRAD PT Short Term Goal 3 (Week 1): Pt will ambulate 20ft w/min assist and LRAD  Skilled Therapeutic Interventions/Progress Updates:    Pt seated in w/c on arrival and agreeable to therapy. Pt reports some soreness in R UE, did not interfere with therapy, rest breaks as needed. Pt transported to therapy gym for time management and energy conservation.   Stand pivot transfer with RW and CGA throughout, VC for upright posture and walker proximity.  Pt performed the following exercises to promote LE strength and endurance:  Nustep 4 x 3 min with 1 min seated rest breaks, workload 4 with 60-70 spm. RPE of 15. UE AAROM with dowel for improved UE mobility, chest press x 10, shoulder flexion x 10. Pt demoed poor shoulder ROM and was unable to prevent shoulder elevation with VC.   Gait training: Gait x 70 ft with RW and CGA during assessment with Gerald Stabs from [place]. Plan to get shoe lift to improve gait mechanics and related hip pain.  Pt navigated 3" stairs 2 x 8 with CGA and BIL handrails.  Pt returned to room and remained in w/c, was left with all needs in reach and alarm active.   Therapy Documentation Precautions:  Precautions Precautions: Fall, Cervical Required Braces or Orthoses: Cervical Brace Cervical Brace: Hard collar, At all times, Other (comment)    Therapy/Group: Individual Therapy  Mickel Fuchs 01/10/2021, 2:30 PM

## 2021-01-10 NOTE — Progress Notes (Signed)
Physical Therapy Session Note  Patient Details  Name: Logan [name] MRN: [id number] Date of Birth: [birth year]  Today's Date: 01/10/2021 PT Individual Time: 1000-1100 PT Individual Time Calculation (min): 60 min   Short Term Goals: Week 1:  PT Short Term Goal 1 (Week 1): Pt will perform supine to sit w/mod assist and LRAD PT Short Term Goal 2 (Week 1): Pt will transfer bed to/from wc w/mod assist and LRAD PT Short Term Goal 3 (Week 1): Pt will ambulate 75ft w/min assist and LRAD  Skilled Therapeutic Interventions/Progress Updates:    Pt seated in w/c on arrival and agreeable to therapy. No complaint of pain. Gait x 25 ft at start of session with RW and CGA, pt demoes improved gait mechanics with lift  in L shoe, but would likely benefit from incr lift height. Pt transported to therapy gym for time management and energy conservation. Upon standing to continue therapy, pt reported bowel incontinence. Pt transported back to room and performed ambulatory transfer with CGA and RW. Dependent clothing management and hygiene in standing. Gait 2 x 70 ft with RW and CGA. Pt then directed in side steps, 2 x 21 ft BIL with CGA and VC for stepping laterally and maintaining neutral foot placement. Pt returned to room and was left with all needs in reach and alarm active.   Therapy Documentation Precautions:  Precautions Precautions: Fall, Cervical Required Braces or Orthoses: Cervical Brace Cervical Brace: Hard collar, At all times, Other (comment)     Therapy/Group: Individual Therapy  Mickel Fuchs 01/10/2021, 12:48 PM

## 2021-01-10 NOTE — Progress Notes (Signed)
Orthopedic Tech Progress Note Patient Details:  Logan [name] 08-Aug-1945 [id number] 3/4 inch shoe lift has been ordered from [employer]  Patient ID: [name], male   DOB: 10/01/1945, 75 y.o.   MRN: [id number]  Jearld Lesch 01/10/2021, 6:08 PM

## 2021-01-10 NOTE — Progress Notes (Signed)
Patient ID: Logan [name], male   DOB: 27-Apr-1945, 75 y.o.   MRN: [id number]    I removed the patient's right ACDF staples today without any complications. The wound is CDI, well approximated, and without any complicating features. Plan to leave wound open to air. Hard cervical collar was replaced. RN notified of removal of staples.    Marvis Moeller, DNP, NP-C Neurosurgery 01/10/2021 11:42 AM

## 2021-01-10 NOTE — Progress Notes (Signed)
Occupational Therapy Session Note  Patient Details  Name: Logan [name] MRN: [id number] Date of Birth: 1945-07-09  Today's Date: 01/10/2021 OT Individual Time: 0800-0900 OT Individual Time Calculation (min): 60 min    Short Term Goals: Week 1:  OT Short Term Goal 1 (Week 1): Pt will complete sit<>stand transfers using LRAD with CGA in preperation for dressing and toileting OT Short Term Goal 2 (Week 1): Pt will complete UB dressing with min assist OT Short Term Goal 3 (Week 1): Pt will increased right shoulder scaption to 50 degrees for functional reaching tasks. OT Short Term Goal 4 (Week 1): Pt will donn shorts using reacher with min assist.  Skilled Therapeutic Interventions/Progress Updates:    Patient in bed, alert and pleasant t/o session.  He notes mild pain in left hip that was relieved with change of position.  Assisted with shaving at bed level to ensure head position.  Washed and replaced front pads of cervical collar.  Assisted with donning clean brief max/dep.  Donned shorts with max A.  He moved to sitting position edge of bed with mod A, good carryover of technique.  Sit to stand and stand pivot transfer with RW CG/min A to steady.  To therapy gym via w/c.  Completed stand pivot transfer to/from mat table with CGA and good awareness of position in space.  He tolerated unsupported sitting for 20 minutes with review and practice with assistive devices to include dressing stick, reacher and sock aide.  After initial instruction he was able to use these devices for socks/shoes with min/mod A.  He will benefit from further practice.  Completed light shoulder and scapular exercises prior to return to room.  He remained seated in w/c at close of session, seat belt alarm set and call bell in reach.    Therapy Documentation Precautions:  Precautions Precautions: Fall, Cervical Required Braces or Orthoses: Cervical Brace Cervical Brace: Hard collar, At all times, Other  (comment)  Therapy/Group: Individual Therapy  Carlos Levering 01/10/2021, 7:38 AM

## 2021-01-10 NOTE — Progress Notes (Signed)
[facility] PHYSICAL MEDICINE & REHABILITATION PROGRESS NOTE  Subjective/Complaints: Says he does not feel that he received his BP medication this morning- discussed with RN and she confirmed- she will give him soon. He has no other complaints  ROS: Denies CP, SOB, N/V/D, +pain in hip  Objective: Vital Signs: Blood pressure (!) 123/50, pulse 73, temperature 98 F (36.7 C), resp. rate 18, height 5\' 7"  (1.702 m), weight 83.2 kg, SpO2 97 %. No results found. No results for input(s): WBC, HGB, HCT, PLT in the last 72 hours. No results for input(s): NA, K, CL, CO2, GLUCOSE, BUN, CREATININE, CALCIUM in the last 72 hours.  Intake/Output Summary (Last 24 hours) at 01/10/2021 1052 Last data filed at 01/09/2021 1820 Gross per 24 hour  Intake 480 ml  Output 300 ml  Net 180 ml        Physical Exam: BP (!) 123/50 (BP Location: Right Arm)   Pulse 73   Temp 98 F (36.7 C)   Resp 18   Ht 5\' 7"  (1.702 m)   Wt 83.2 kg   SpO2 97%   BMI 28.73 kg/m  Constitutional: No distress . Vital signs reviewed. HENT: Normocephalic.  Atraumatic. Eyes: EOMI. No discharge. Cardiovascular: No JVD.  Irregularly irregular Respiratory: Normal effort.  No stridor.  Bilateral clear to auscultation. GI: Non-distended.  BS +. Skin: Warm and dry.  Intact. Psych: Normal mood.  Normal behavior. Musc: No edema in extremities.  No tenderness in extremities. Neuro: Alert Dysphonia, unchanged Motor: Bilateral upper extremities: Shoulder abduction 2/5, elbow flexion/extension 3+/5, handgrip 4/5, right weaker than left, stable Bilateral lower extremities: 4-/5 proximal distal Ambulating CG with RW  Assessment/Plan: 1. Functional deficits which require 3+ hours per day of interdisciplinary therapy in a comprehensive inpatient rehab setting. Physiatrist is providing close team supervision and 24 hour management of active medical problems listed below. Physiatrist and rehab team continue to assess barriers to  discharge/monitor patient progress toward functional and medical goals   Care Tool:  Bathing    Body parts bathed by patient: Right arm, Left arm, Chest, Abdomen, Front perineal area, Right upper leg, Left upper leg   Body parts bathed by helper: Face, Buttocks, Left lower leg, Right lower leg     Bathing assist Assist Level: Moderate Assistance - Patient 50 - 74%     Upper Body Dressing/Undressing Upper body dressing   What is the patient wearing?: Button up shirt    Upper body assist Assist Level: Maximal Assistance - Patient 25 - 49%    Lower Body Dressing/Undressing Lower body dressing      What is the patient wearing?: Pants     Lower body assist Assist for lower body dressing: Moderate Assistance - Patient 50 - 74%     Toileting Toileting    Toileting assist Assist for toileting: Total Assistance - Patient < 25%     Transfers Chair/bed transfer  Transfers assist     Chair/bed transfer assist level: 2 Helpers     Locomotion Ambulation   Ambulation assist      Assist level: (P) Contact Guard/Touching assist Assistive device: (P) Walker-rolling Max distance: (P) 93ft   Walk 10 feet activity   Assist     Assist level: (P) Contact Guard/Touching assist Assistive device: (P) Walker-rolling   Walk 50 feet activity   Assist Walk 50 feet with 2 turns activity did not occur: Safety/medical concerns  Assist level: (P) Contact Guard/Touching assist Assistive device: (P) Walker-rolling    Walk 150  feet activity   Assist Walk 150 feet activity did not occur: Safety/medical concerns         Walk 10 feet on uneven surface  activity   Assist Walk 10 feet on uneven surfaces activity did not occur: Safety/medical concerns         Wheelchair     Assist Is the patient using a wheelchair?: Yes Type of Wheelchair: Manual    Wheelchair assist level: Contact Guard/Touching assist Max wheelchair distance: 60    Wheelchair 50 feet  with 2 turns activity    Assist        Assist Level: Contact Guard/Touching assist   Wheelchair 150 feet activity     Assist  Wheelchair 150 feet activity did not occur: Safety/medical concerns   Assist Level: Total Assistance - Patient < 25%    Medical Problem List and Plan: 1.  Cervical myelopathy  Continue CIR 2.  Impaired mobility:  -DVT/anticoagulation:  Pharmaceutical: Heparin--low dose. Was on Eliquis PTA--held due to surgery--> to resume on 9/23 per recs.             -antiplatelet therapy: N/A 3. Endstate OA left hip/Pain Management: Oxycodone prn             Continue voltaren gel to left hip QID             Xray showing left hip OA  Controlled with meds on 9/22 Monitor with increased exertion 4. Mood: LCSW to follow for evaluation and support.              -antipsychotic agents: N/A 5. Neuropsych: This patient is capable of making decisions on his own behalf. 6. Bilateral knee abraisons/cervical insicion:  Routine pressure relief measures--monitor incision for healing.              --D/ced bacitracin ointement Continue Aquacell with dry dressing to bilateral knee abrasions.  7. Fluids/Electrolytes/Nutrition: Monitor I/Os.  8. CAF: Monitor HR TID--continue to hold eliquis till 9/23             --On coreg BID.  Filed Weights   01/03/21 1653  Weight: 83.2 kg  9.  Dehydration: Encourage fluid intake. Monitor orthostatic symptoms 10. Hypoxia/ Productive cough: Will down grade diet to dysphagia 3 with aspiration precautions/supervison             --has been cleared to remove collar for meals.              --CXR showing atelectasis  Wean off supplemental oxygen 11.  Prediabetes diet controlled?: Has been off meds for 5 years.  Hemoglobin A1c 5.7 on 9/16 Relatively controlled on 9/21 Monitor with increased mobility 12. HTN: Monitor BP TID--continue Lasix and Cozaar. BP currently soft but as expected for SCI  Controlled on 9/22  Monitor with increased  mobility 13.  Macrocytic anemia:   Folate within normal limits on 9/16 Hemoglobin 11.5 on 9/19 14. OSA: Does not use CPAP. 15. Constipation: Has not had BM for a couple of days             Added Senna S.   MiraLAX started on 9/16 16. Overweight BMI 28.82: provide dietary education 17.  Hypokalemia  Potassium 3.9 on 9/19, plan to repeat labs on Friday  Supplemented increased on 9/16  18.  Hypoalbuminemia  Supplement initiated on 9/16  LOS: 7 days A FACE TO FACE EVALUATION WAS PERFORMED  Clide Deutscher Raffaella Edison 01/10/2021, 10:52 AM

## 2021-01-10 NOTE — Progress Notes (Signed)
Occupational Therapy Session Note  Patient Details  Name: Logan [name] MRN: [id number] Date of Birth: 1946-03-26  Today's Date: 01/11/2021 OT Individual Time: 1002-1057 OT Individual Time Calculation (min): 55 min   Short Term Goals: Week 1:  OT Short Term Goal 1 (Week 1): Pt will complete sit<>stand transfers using LRAD with CGA in preperation for dressing and toileting OT Short Term Goal 2 (Week 1): Pt will complete UB dressing with min assist OT Short Term Goal 3 (Week 1): Pt will increased right shoulder scaption to 50 degrees for functional reaching tasks. OT Short Term Goal 4 (Week 1): Pt will donn shorts using reacher with min assist.  Skilled Therapeutic Interventions/Progress Updates:    Pt greeted in the w/c, wearing c-collar, reportedly just finished PT. Pts ADL needs were met, so he was escorted via w/c to the dayroom. Session focus was placed on improving Rt shoulder biomechanics/strength in order to improve Rt UE function during daily tasks. Pt used the UE ranger with min manual guidance, otherwise pt unable to fully control direction of hand paddle. Pt participated in circumduction exercises Rt>Lt directions, shoulder flexion/extension, and horizontal abduction/adduction until reaching the point of fatigue. Worked on neutral positioning of shoulders throughout tx given verbal/manual cues +/or Warden/ranger (using mirror). Pt tends to hike his Rt shoulder and compensate with trunk when attempting to lift his Rt UE. Next worked on Solicitor with a little bit of a gravity challenge in each direction. Transitioned to B shoulder strengthening via self propulsion of the w/c ~120 ft. Pt was then returned to the room at close of session, remained sitting up with safety belt fastened and call bell within reach.   Therapy Documentation Precautions:  Precautions Precautions: Fall, Cervical Required Braces or Orthoses: Cervical Brace Cervical Brace:  Hard collar, At all times, Other (comment) Pain: in the Lt hip this AM, reportedly absolved after receiving voltaran gel from RN ADL: ADL Grooming: Dependent Where Assessed-Grooming: Sitting at sink Upper Body Bathing: Minimal assistance Where Assessed-Upper Body Bathing: Sitting at sink Lower Body Bathing: Maximal assistance Where Assessed-Lower Body Bathing: Sitting at sink, Standing at sink Upper Body Dressing: Maximal assistance Where Assessed-Upper Body Dressing: Sitting at sink Lower Body Dressing: Maximal assistance Where Assessed-Lower Body Dressing: Sitting at sink, Standing at sink     Therapy/Group: Individual Therapy  Matia Zelada A Maclane Holloran 01/11/2021, 12:42 PM

## 2021-01-11 LAB — GLUCOSE, CAPILLARY
Glucose-Capillary: 98 mg/dL (ref 70–99)
Glucose-Capillary: 125 mg/dL — ABNORMAL HIGH (ref 70–99)
Glucose-Capillary: 100 mg/dL — ABNORMAL HIGH (ref 70–99)
Glucose-Capillary: 84 mg/dL (ref 70–99)

## 2021-01-11 NOTE — Progress Notes (Signed)
Physical Therapy Session Note  Patient Details  Name: Logan [name] MRN: [id number] Date of Birth: 03/06/1946  Today's Date: 01/11/2021 PT Individual Time: 8185-6314 PT Individual Time Calculation (min): 70 min   Short Term Goals: Week 1:  PT Short Term Goal 1 (Week 1): Pt will perform supine to sit w/mod assist and LRAD PT Short Term Goal 2 (Week 1): Pt will transfer bed to/from wc w/mod assist and LRAD PT Short Term Goal 3 (Week 1): Pt will ambulate 42f w/min assist and LRAD  Skilled Therapeutic Interventions/Progress Updates: Pt presented in w/c agreeable to therapy. Pt states some pain/stiffness in L hip, nsg unavailable to apply pain gel but pt agreeable to participate and take breaks as needed. Pt transported to day room for energy conservation. Pt participated in Cybex Kinetron 90cm/sec 1 min x 2 then 60cm/sec x 1 minute for BLE strengthening and general conditioning. Pt then participated in Wii bowling with first two frames in sitting then 7 frames standing and last frame in sitting due to fatigue. Pt was able to demonstrate fair dynamic balance with LUE on RW and actively swing RUE and coordinate buttons with min cues. Pt also participated in life size connect four tolerating standing ~8 minutes performing reaching tasks with both L/R UE but maintaining opposite extremity on RW. Once game completed pt performed seated therapeutic task of reaching and cleaning game pieces. Pt transported back to room at end of session and left with call bell within reach and needs met.      Therapy Documentation Precautions:  Precautions Precautions: Fall, Cervical Required Braces or Orthoses: Cervical Brace Cervical Brace: Hard collar, At all times, Other (comment) General:   Vital Signs:  Pain:   Mobility:   Locomotion :    Trunk/Postural Assessment :    Balance:   Exercises:   Other Treatments:      Therapy/Group: Individual Therapy  Bharath Bernstein 01/11/2021, 4:12 PM

## 2021-01-11 NOTE — Progress Notes (Signed)
Physical Therapy Session Note  Patient Details  Name: Logan [name] MRN: [id number] Date of Birth: 26-Oct-1945  Today's Date: 01/11/2021 PT Individual Time: 4431-5400 PT Individual Time Calculation (min): 24 min   Short Term Goals: Week 1:  PT Short Term Goal 1 (Week 1): Pt will perform supine to sit w/mod assist and LRAD PT Short Term Goal 2 (Week 1): Pt will transfer bed to/from wc w/mod assist and LRAD PT Short Term Goal 3 (Week 1): Pt will ambulate 51ft w/min assist and LRAD  Skilled Therapeutic Interventions/Progress Updates:     Pt received seated in [facility] and agrees to therapy. Reports some stiffness pain in L hip. Number not provided. PT provides rest breaks as needed to manage pain. Pt had not yet received L shoe with lift so walking deferred at this time. Pt performs sit to stand following multiple attempts with minA. Stand is successful once pt adequately weight shifts forward following multimodal cues from PT. Pt performs x10 minisquats holding onto RW with CGA from PT. PT cues for upright posture to improve balance and body mechanics with movement to optimize strengthening and motor pattern. 3x10 with RW and seated rest breaks. Sequencing of sit to stand improves with each subsequent rep, with anterior weight shift improving and eventually requiring CGA versus mina. Minisquats also improve with pt able to descend hips lower with each subsequent bout. PT cues for decreased WB through arms to focus strengthening on core and lower extremities. Following, pt left seated in WC with alarm intact and all needs within reach.  Therapy Documentation Precautions:  Precautions Precautions: Fall, Cervical Required Braces or Orthoses: Cervical Brace Cervical Brace: Hard collar, At all times, Other (comment)    Therapy/Group: Individual Therapy  Breck Coons, PT, DPT 01/11/2021, 2:30 PM

## 2021-01-11 NOTE — Progress Notes (Signed)
Physical Therapy Session Note  Patient Details  Name: Logan [name] MRN: [id number] Date of Birth: 17-Mar-1946  Today's Date: 01/11/2021 PT Individual Time: 1510-1605 PT Individual Time Calculation (min): 55 min   Short Term Goals: Week 1:  PT Short Term Goal 1 (Week 1): Pt will perform supine to sit w/mod assist and LRAD PT Short Term Goal 2 (Week 1): Pt will transfer bed to/from wc w/mod assist and LRAD PT Short Term Goal 3 (Week 1): Pt will ambulate 36f w/min assist and LRAD Week 2:     Skilled Therapeutic Interventions/Progress Updates:  Pt received sitting in WC and agreeable to PT. [facility]stand transfers performed x 8 throughout session with min progressing to CGA with UE support on RW. Cues for LE placement and anterior weight shift to prevent posterior LOB. Sit<>supine from mat table with min assist overall and increased time and BLE management by pt.   Gait training with RW 2x675fwith min assist on first bout and CGA on second bout. Cues for improved posture and increased step width to improve safety. Mild trendelenburg on the LLE due to hip pain 1bout>2nd bout.   Supine therex:  SAQ 2x 12 Ankle PF 2x 15 level 2 tband  Hip flexion 2x 12 level 2 tband  Hip abduction 2x 12 level 2 tband  SLR 2x 10  Cues for improved ROM and decreased speed on BLE. AAROM on the LLR for hip flexion and SLR due to pain in the LLE.   WC mobility 2 [id number] supervision assist from PT with cues for safety in turns and doorway management to prevent hitting obstacles.   Patient returned to room and left [facility] in reach and all needs met.         Therapy Documentation Precautions:  Precautions Precautions: Fall, Cervical Required Braces or Orthoses: Cervical Brace Cervical Brace: Hard collar, At all times, Other (comment)  Pain:   denies    Therapy/Group: Individual Therapy  AusLorie Phenix23/2022, 4:19 PM

## 2021-01-11 NOTE — Progress Notes (Signed)
/[facility] PHYSICAL MEDICINE & REHABILITATION PROGRESS NOTE-  Late note--patient seen on 01/12/32  Subjective/Complaints: Doing well. Was disappointed that he missed Dr. Adam Phenix. Has been sleeping very well with sleeping pill.   ROS: Denies CP, SOB, N/V/D, +pain in hip  Objective: Vital Signs: Blood pressure 122/72, pulse 69, temperature (!) 97.5 F (36.4 C), temperature source Oral, resp. rate 18, height 5\' 7"  (1.702 m), weight 83.2 kg, SpO2 94 %. No results found. No results for input(s): WBC, HGB, HCT, PLT in the last 72 hours. No results for input(s): NA, K, CL, CO2, GLUCOSE, BUN, CREATININE, CALCIUM in the last 72 hours.  Intake/Output Summary (Last 24 hours) at 01/11/2021 1716 Last data filed at 01/11/2021 1646 Gross per 24 hour  Intake 600 ml  Output 225 ml  Net 375 ml        Physical Exam: BP 122/72 (BP Location: Right Arm)   Pulse 69   Temp (!) 97.5 F (36.4 C) (Oral)   Resp 18   Ht 5\' 7"  (1.702 m)   Wt 83.2 kg   SpO2 94%   BMI 28.73 kg/m  Constitutional: No distress . Vital signs reviewed. HEENT: AT,[place] Cardiovascular: No JVD.  Irregularly irregular Respiratory: Normal effort.  No stridor.  Bilateral clear to auscultation. GI: Non-distended.  BS +. Skin: Warm and dry.  Intact. [place]: Normal mood.  Normal behavior. Musc: No edema in extremities.  No tenderness in extremities. Neuro: Alert Dysphonia, unchanged Motor: Bilateral upper extremities: Shoulder abduction 2/5, elbow flexion/extension 3+/5, handgrip 4/5, right weaker than left, stable Bilateral lower extremities: 4-/5 proximal distal Ambulating CG with RW  Assessment/Plan: 1. Functional deficits which require 3+ hours per day of interdisciplinary therapy in a comprehensive inpatient rehab setting. Physiatrist is providing close team supervision and 24 hour management of active medical problems listed below. Physiatrist and rehab team continue to assess barriers to discharge/monitor patient progress  toward functional and medical goals   Care Tool:  Bathing    Body parts bathed by patient: Right arm, Left arm, Chest, Abdomen, Front perineal area, Right upper leg, Left upper leg   Body parts bathed by helper: Face, Buttocks, Left lower leg, Right lower leg     Bathing assist Assist Level: Moderate Assistance - Patient 50 - 74%     Upper Body Dressing/Undressing Upper body dressing   What is the patient wearing?: Button up shirt    Upper body assist Assist Level: Maximal Assistance - Patient 25 - 49%    Lower Body Dressing/Undressing Lower body dressing      What is the patient wearing?: Pants, Incontinence brief     Lower body assist Assist for lower body dressing: Maximal Assistance - Patient 25 - 49%     Toileting Toileting    Toileting assist Assist for toileting: Total Assistance - Patient < 25%     Transfers Chair/bed transfer  Transfers assist     Chair/bed transfer assist level: 2 Helpers     Locomotion Ambulation   Ambulation assist      Assist level: (P) Contact Guard/Touching assist Assistive device: (P) Walker-rolling Max distance: (P) 37ft   Walk 10 feet activity   Assist     Assist level: (P) Contact Guard/Touching assist Assistive device: (P) Walker-rolling   Walk 50 feet activity   Assist Walk 50 feet with 2 turns activity did not occur: Safety/medical concerns  Assist level: (P) Contact Guard/Touching assist Assistive device: (P) Walker-rolling    Walk 150 feet activity   Assist  Walk 150 feet activity did not occur: Safety/medical concerns         Walk 10 feet on uneven surface  activity   Assist Walk 10 feet on uneven surfaces activity did not occur: Safety/medical concerns         Wheelchair     Assist Is the patient using a wheelchair?: Yes Type of Wheelchair: Manual    Wheelchair assist level: Contact Guard/Touching assist Max wheelchair distance: 60    Wheelchair 50 feet with 2 turns  activity    Assist        Assist Level: Contact Guard/Touching assist   Wheelchair 150 feet activity     Assist  Wheelchair 150 feet activity did not occur: Safety/medical concerns   Assist Level: Total Assistance - Patient < 25%    Medical Problem List and Plan: 1.  Cervical myelopathy  Continue CIR 2.  Impaired mobility:  -DVT/anticoagulation:  Pharmaceutical: Heparin--low dose. Was on Eliquis PTA--held due to surgery--> to resume on 9/23 per recs.             -antiplatelet therapy: N/A 3. Endstate OA left hip/Pain Management: Oxycodone prn             Continue voltaren gel to left hip QID             Xray showing left hip OA  Controlled with meds on 9/22 Monitor with increased exertion 4. Mood: LCSW to follow for evaluation and support.              -antipsychotic agents: N/A 5. Neuropsych: This patient is capable of making decisions on his own behalf. 6. Bilateral knee abraisons/cervical insicion:  Routine pressure relief measures--monitor incision for healing.              --Continue Aquacell with dry dressing to bilateral knee abrasions.  7. Fluids/Electrolytes/Nutrition: Monitor I/Os.  8. CAF: Monitor HR TID--continue to hold eliquis till 9/23             --On coreg BID.  Filed Weights   01/03/21 1653  Weight: 83.2 kg  9.  Dehydration: Encourage fluid intake. Monitor orthostatic symptoms 10. Hypoxia/ Productive cough: Will down grade diet to dysphagia 3 with aspiration precautions/supervison             --has been cleared to remove collar for meals.              --CXR showing atelectasis  Wean off supplemental oxygen 11.  Prediabetes diet controlled?: Has been off meds for 5 years.  Hemoglobin A1c 5.7 on 9/16 Relatively controlled on 9/21 Monitor with increased mobility 12. HTN: Monitor BP TID--continue Lasix and Cozaar.   Controlled on 9/23 13.  Macrocytic anemia:   Folate within normal limits on 9/16 Hemoglobin 11.5 on 9/19 14. OSA: Does not use  CPAP. 15. Constipation: having good results with current regimen.  16. Overweight BMI 28.82: provide dietary education 17.  Hypokalemia  Potassium 3.9 on 9/19,  Supplemented increased on 9/16  18.  Hypoalbuminemia  Supplement initiated on 9/16  LOS: 8 days A FACE TO Luana S Luzmaria Devaux 01/11/2021, 5:16 PM

## 2021-01-12 LAB — GLUCOSE, CAPILLARY
Glucose-Capillary: 100 mg/dL — ABNORMAL HIGH (ref 70–99)
Glucose-Capillary: 102 mg/dL — ABNORMAL HIGH (ref 70–99)
Glucose-Capillary: 184 mg/dL — ABNORMAL HIGH (ref 70–99)
Glucose-Capillary: 112 mg/dL — ABNORMAL HIGH (ref 70–99)

## 2021-01-12 NOTE — Progress Notes (Signed)
Occupational Therapy Session Note  Patient Details  Name: Logan [name] MRN: [id number] Date of Birth: 1946-04-02  Today's Date: 01/13/2021 OT Individual Time: 5852-7782 OT Individual Time Calculation (min): 40 min   Short Term Goals: Week 1:  OT Short Term Goal 1 (Week 1): Pt will complete sit<>stand transfers using LRAD with CGA in preperation for dressing and toileting OT Short Term Goal 2 (Week 1): Pt will complete UB dressing with min assist OT Short Term Goal 3 (Week 1): Pt will increased right shoulder scaption to 50 degrees for functional reaching tasks. OT Short Term Goal 4 (Week 1): Pt will donn shorts using reacher with min assist.  Skilled Therapeutic Interventions/Progress Updates:    Pt greeted in the w/c, premedicated for pain and ADL needs met. Tx focus was placed on increasing functional independence with LB dressing. Started AE training with reacher, sock aide, and shoe horn. Pt able to doff and don footwear with supervision assist using adaptive equipment (unable to don shoes while wearing gripper socks but did well without socks. Per pt, he does not wear socks with shoes at home). Also practiced simulated LB dressing donning "pants" using gait belt and reacher. Pt needed to use both hands to use reacher effectively but did very well. He remained sitting up in the w/c at close of session, all needs within reach and safety belt fastened.   Therapy Documentation Precautions:  Precautions Precautions: Fall, Cervical Required Braces or Orthoses: Cervical Brace Cervical Brace: Hard collar, At all times, Other (comment) Restrictions Weight Bearing Restrictions: No Vital Signs: Therapy Vitals Temp: 98 F (36.7 C) Temp Source: Oral Pulse Rate: 65 Resp: 17 BP: (!) 89/55 Patient Position (if appropriate): Sitting Oxygen Therapy SpO2: 99 % O2 Device: Room Air  ADL: ADL Grooming: Dependent Where Assessed-Grooming: Sitting at sink Upper Body Bathing: Minimal  assistance Where Assessed-Upper Body Bathing: Sitting at sink Lower Body Bathing: Maximal assistance Where Assessed-Lower Body Bathing: Sitting at sink, Standing at sink Upper Body Dressing: Maximal assistance Where Assessed-Upper Body Dressing: Sitting at sink Lower Body Dressing: Maximal assistance Where Assessed-Lower Body Dressing: Sitting at sink, Standing at sink     Therapy/Group: Individual Therapy  Griffyn Kucinski A Freedom Lopezperez 01/13/2021, 1:38 PM

## 2021-01-13 LAB — GLUCOSE, CAPILLARY
Glucose-Capillary: 173 mg/dL — ABNORMAL HIGH (ref 70–99)
Glucose-Capillary: 297 mg/dL — ABNORMAL HIGH (ref 70–99)
Glucose-Capillary: 111 mg/dL — ABNORMAL HIGH (ref 70–99)
Glucose-Capillary: 112 mg/dL — ABNORMAL HIGH (ref 70–99)

## 2021-01-13 MED ORDER — APIXABAN 5 MG PO TABS
5.0000 mg | ORAL_TABLET | Freq: Two times a day (BID) | ORAL | Status: DC
  Administered 2021-01-13 – 2021-01-30 (×34): 5 mg via ORAL
  Filled 2021-01-13 (×34): qty 1

## 2021-01-13 NOTE — Progress Notes (Signed)
Physical Therapy Session Note  Patient Details  Name: Logan [name] MRN: [id number] Date of Birth: [birth year]  Today's Date: 01/13/2021 PT Individual Time: 0800-0900 and 1400-1442 PT Individual Time Calculation (min): 60 min And 42 min  Short Term Goals: Week 1:  PT Short Term Goal 1 (Week 1): Pt will perform supine to sit w/mod assist and LRAD PT Short Term Goal 2 (Week 1): Pt will transfer bed to/from wc w/mod assist and LRAD PT Short Term Goal 3 (Week 1): Pt will ambulate 77ft w/min assist and LRAD Week 2:     Skilled Therapeutic Interventions/Progress Updates:     Session 1: Patient in w/c ready for PT upon PT arrival. Patient alert and agreeable to PT session. Patient denied pain during session.  Patient requested to have hair combed with total A before leaving the room. PT also donned B tennis shoes with total A, L shoe adapted with shoe lift.   Therapeutic Activity: Transfers: Patient performed sit to/from stand x2 with supervision using RW, following NMR, see below. Provided verbal cues for forward weight shift and controlled descent.  Gait Training:  Patient ambulated 88.5 feet and 133.5 feet using RW with CGA and w/c follow due to decreased activity tolerance. Ambulated with step-to gait pattern leading with L, progressed to reciprocal gait patten with cues and demonstration between trials. Continues to ambulate with forward flexed posture and downward head gaze with min improvement with multimodal cues.  Wheelchair Mobility:  Patient propelled wheelchair 136 feet with supervision using B upper extremities. Provided verbal cues for turning technique and equal B propulsion.   Neuromuscular Re-ed: Patient performed the following lower extremity motor control and balance activities with functional tasks: -sit to/from stand x10 with RW focused on foot placement and forward weight shift, progressed from min A-supervision with multimodal cues, intermittently required 2 attempts due  to reduced forward weight shift -standing balance 10-20 sec x10 focused on hip/trunk extension and reduced upper extremity support with supervision with RW  Therapeutic Exercise: Patient performed the following exercises with verbal and tactile cues for proper technique. -seated hamstring/heel cord stretch R/L 2x1 min -figure four stretch 2x1 min  Patient in w/c in the room at end of session with breaks locked, seat belt alarm set, and all needs within reach.   Session 2: Patient in bed asleep upon PT arrival. Patient easily aroused and agreeable to PT session. Patient reported 2-3/10 R shoulder soreness during session, RN made aware. PT provided repositioning, rest breaks, and distraction as pain interventions throughout session.   Patient reported increased fatigue this afternoon following a large BM. Reports his stomach was "tore up" from medication given for BM and requested not to receive this medication any more, RN made aware.   Therapeutic Activity: Bed Mobility: Patient performed supine to/from sit with mod A for trunk control due to upper extremity weakness in a flat bed with use of bed rail. Provided verbal cues for log roll technique and setting bottom elbow to push to sit up. Transfers: Patient performed stand pivot bed<>w/c with min A-CGA due to fatigue. He performed squat pivot w/c<>nustep with min A focused on forward weight shift to lift hips during transfer. Performed sit to/from stand x2 with CGA progressing to supervision using RW. Provided verbal cues for forward weight shift. Standing balance >2 min x1 and 1 min x1 progressing from B to single to no upper extremity support.   Therapeutic Exercise: Patient performed the following exercises with verbal and tactile cues for proper technique. -  NuStep 2x5 min with 2 min rest break on level 1, cues to maintain between 55-65 SPM and full ROM with upper extremities  Patient in w/c in the room at end of session with breaks locked,  seat belt alarm set, and all needs within reach.   Therapy Documentation Precautions:  Precautions Precautions: Fall, Cervical Required Braces or Orthoses: Cervical Brace Cervical Brace: Hard collar, At all times, Other (comment) Restrictions Weight Bearing Restrictions: No    Therapy/Group: Individual Therapy  Amour Cutrone L Anissa Abbs PT, DPT  01/13/2021, 4:11 PM

## 2021-01-13 NOTE — Progress Notes (Signed)
Occupational Therapy Session Note  Patient Details  Name: Logan [name] MRN: [id number] Date of Birth: [birth year]  Today's Date: 01/14/2021 OT Individual Time: 1345-1409 OT Individual Time Calculation (min): 24 min   Short Term Goals: Week 1:  OT Short Term Goal 1 (Week 1): Pt will complete sit<>stand transfers using LRAD with CGA in preperation for dressing and toileting OT Short Term Goal 2 (Week 1): Pt will complete UB dressing with min assist OT Short Term Goal 3 (Week 1): Pt will increased right shoulder scaption to 50 degrees for functional reaching tasks. OT Short Term Goal 4 (Week 1): Pt will donn shorts using reacher with min assist.  Skilled Therapeutic Interventions/Progress Updates:    Pt greeted in the w/c with no c/o pain. He was agreeable to practice doffing/donning his shirt while wearing the c-collar. Per MD order, pt only permitted to remove his c-collar to shower and eat. Pt doffed his button up shirt with supervision and increased time, unable to doff it without vcs. Pt needed cues to thread his Rt arm into his shirt first. Unable to bring shirt around in back or use adaptive overhead method that OT taught him during session. Pt very limited by decreased shoulder strength/mobility bilaterally Rt>Lt. Due to numbness in fingers, pt unable to fasten buttons, tried to look down to meet task demands on his own though cues were provided to not do so for cervical precaution adherence. Pt reported that he would be able to do it if he had snap fastens vs buttons. Per pt report, MD stated that he might have a Rt shoulder dislocation. Pt reported that he used to pull himself up on his front steps due to height of stairs using the Rt arm, thinks he injured it via repetitive stress. Pt remained in the w/c at close of session, all needs within reach and safety belt fastened.   Therapy Documentation Precautions:  Precautions Precautions: Fall, Cervical Required Braces or Orthoses: Cervical  Brace Cervical Brace: Hard collar, At all times, Other (comment) Restrictions Weight Bearing Restrictions: No General: General PT Missed Treatment Reason: Patient fatigue Vital Signs: Therapy Vitals Temp: 98.5 F (36.9 C) Temp Source: Oral Pulse Rate: 73 Resp: 18 BP: 134/73 Patient Position (if appropriate): Sitting Oxygen Therapy SpO2: 97 % O2 Device: Room Air ADL: ADL Grooming: Dependent Where Assessed-Grooming: Sitting at sink Upper Body Bathing: Minimal assistance Where Assessed-Upper Body Bathing: Sitting at sink Lower Body Bathing: Maximal assistance Where Assessed-Lower Body Bathing: Sitting at sink, Standing at sink Upper Body Dressing: Maximal assistance Where Assessed-Upper Body Dressing: Sitting at sink Lower Body Dressing: Maximal assistance Where Assessed-Lower Body Dressing: Sitting at sink, Standing at sink     Therapy/Group: Individual Therapy  Chastidy Ranker A Truitt Cruey 01/14/2021, 3:43 PM

## 2021-01-13 NOTE — Progress Notes (Signed)
Blood glucose 297. Patient educated on insulin. Patient refused sliding scale coverage.

## 2021-01-14 ENCOUNTER — Inpatient Hospital Stay (HOSPITAL_COMMUNITY): Payer: Medicare HMO

## 2021-01-14 LAB — GLUCOSE, CAPILLARY
Glucose-Capillary: 91 mg/dL (ref 70–99)
Glucose-Capillary: 76 mg/dL (ref 70–99)
Glucose-Capillary: 92 mg/dL (ref 70–99)
Glucose-Capillary: 112 mg/dL — ABNORMAL HIGH (ref 70–99)

## 2021-01-14 LAB — BASIC METABOLIC PANEL
Anion gap: 7 (ref 5–15)
BUN: 27 mg/dL — ABNORMAL HIGH (ref 8–23)
CO2: 29 mmol/L (ref 22–32)
Calcium: 9.8 mg/dL (ref 8.9–10.3)
Chloride: 105 mmol/L (ref 98–111)
Creatinine, Ser: 0.88 mg/dL (ref 0.61–1.24)
GFR, Estimated: >60 mL/min (ref >60)
Glucose, Bld: 132 mg/dL — ABNORMAL HIGH (ref 70–99)
Potassium: 4.2 mmol/L (ref 3.5–5.1)
Sodium: 141 mmol/L (ref 135–145)

## 2021-01-14 LAB — CBC
HCT: 39.5 % (ref 39.0–52.0)
Hemoglobin: 12.8 g/dL — ABNORMAL LOW (ref 13.0–17.0)
MCH: 34 pg (ref 26.0–34.0)
MCHC: 32.4 g/dL (ref 30.0–36.0)
MCV: 105.1 fL — ABNORMAL HIGH (ref 80.0–100.0)
Platelets: 343 10*3/uL (ref 150–400)
RBC: 3.76 MIL/uL — ABNORMAL LOW (ref 4.22–5.81)
RDW: 15.3 % (ref 11.5–15.5)
WBC: 7.7 10*3/uL (ref 4.0–10.5)
nRBC: 0 % (ref 0.0–0.2)

## 2021-01-14 IMAGING — CR DG SHOULDER 2+V*R*
4 series · 4 of 4 positions shown · non-contrast
Comparison: None.

CLINICAL DATA: Right shoulder deformity

EXAM:
RIGHT SHOULDER - 2+ VIEW

[shoulder grashey]
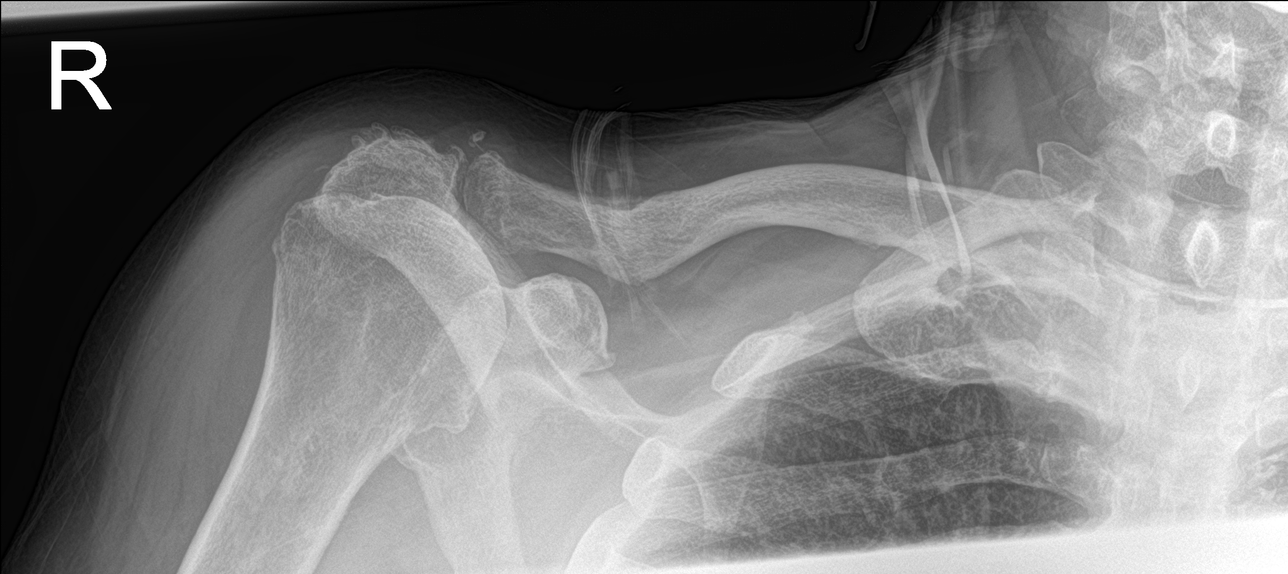

[shoulder ap neutral (1 of 2)]
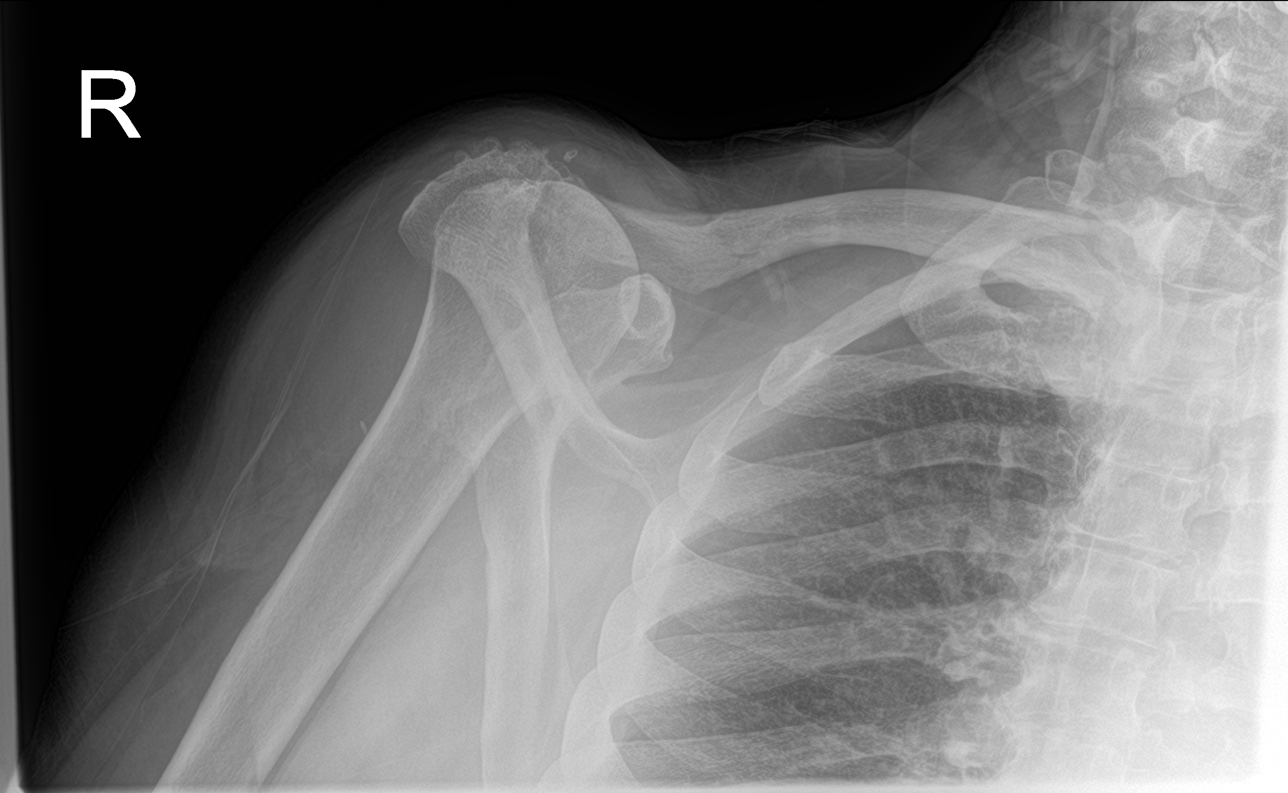

[shoulder y view]
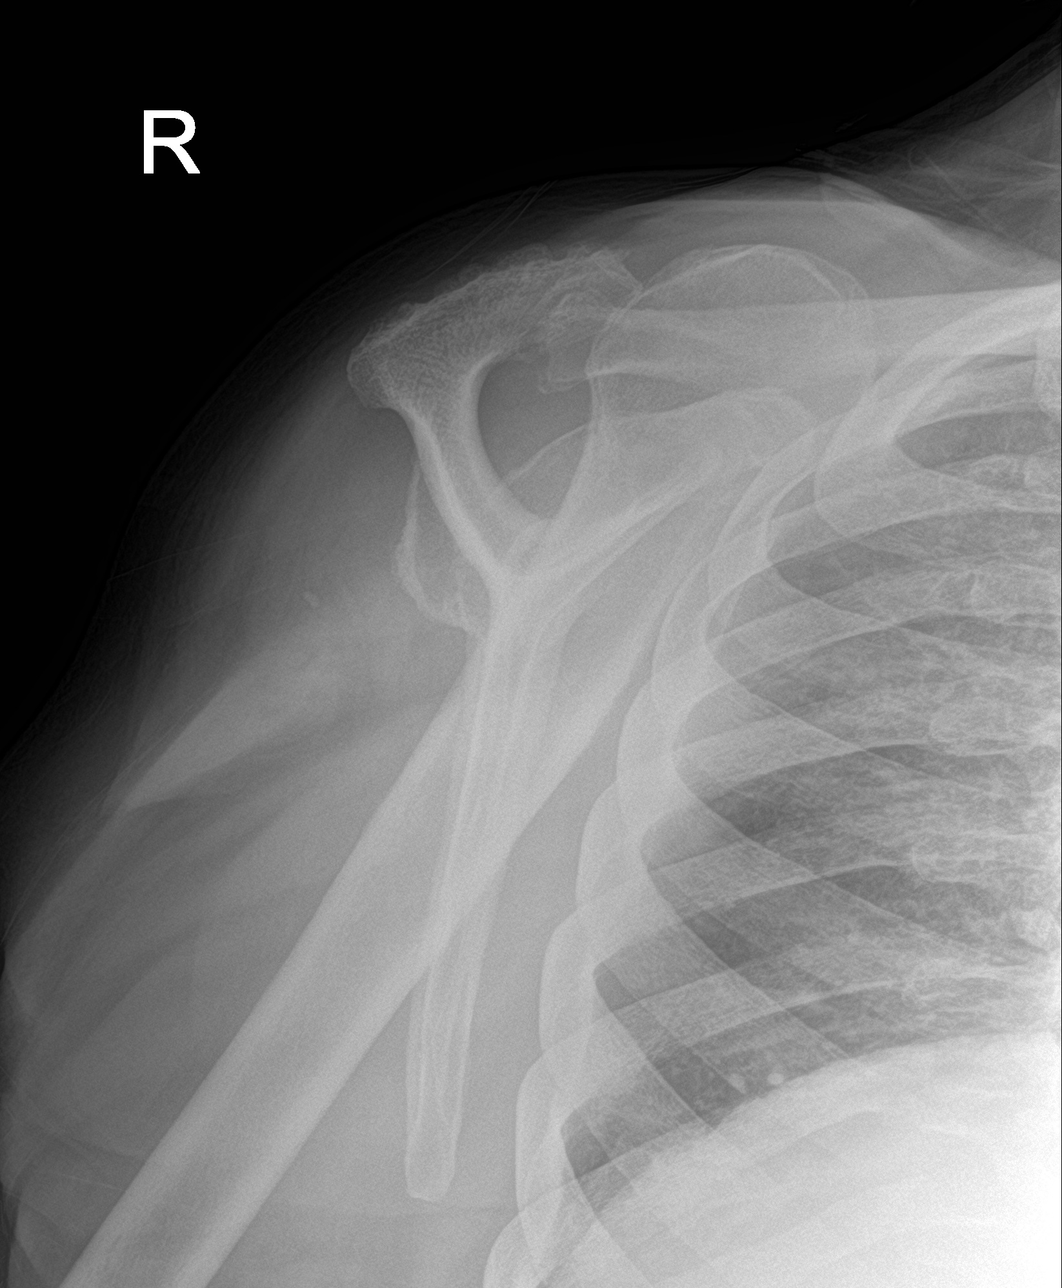

[shoulder ap neutral (2 of 2)]
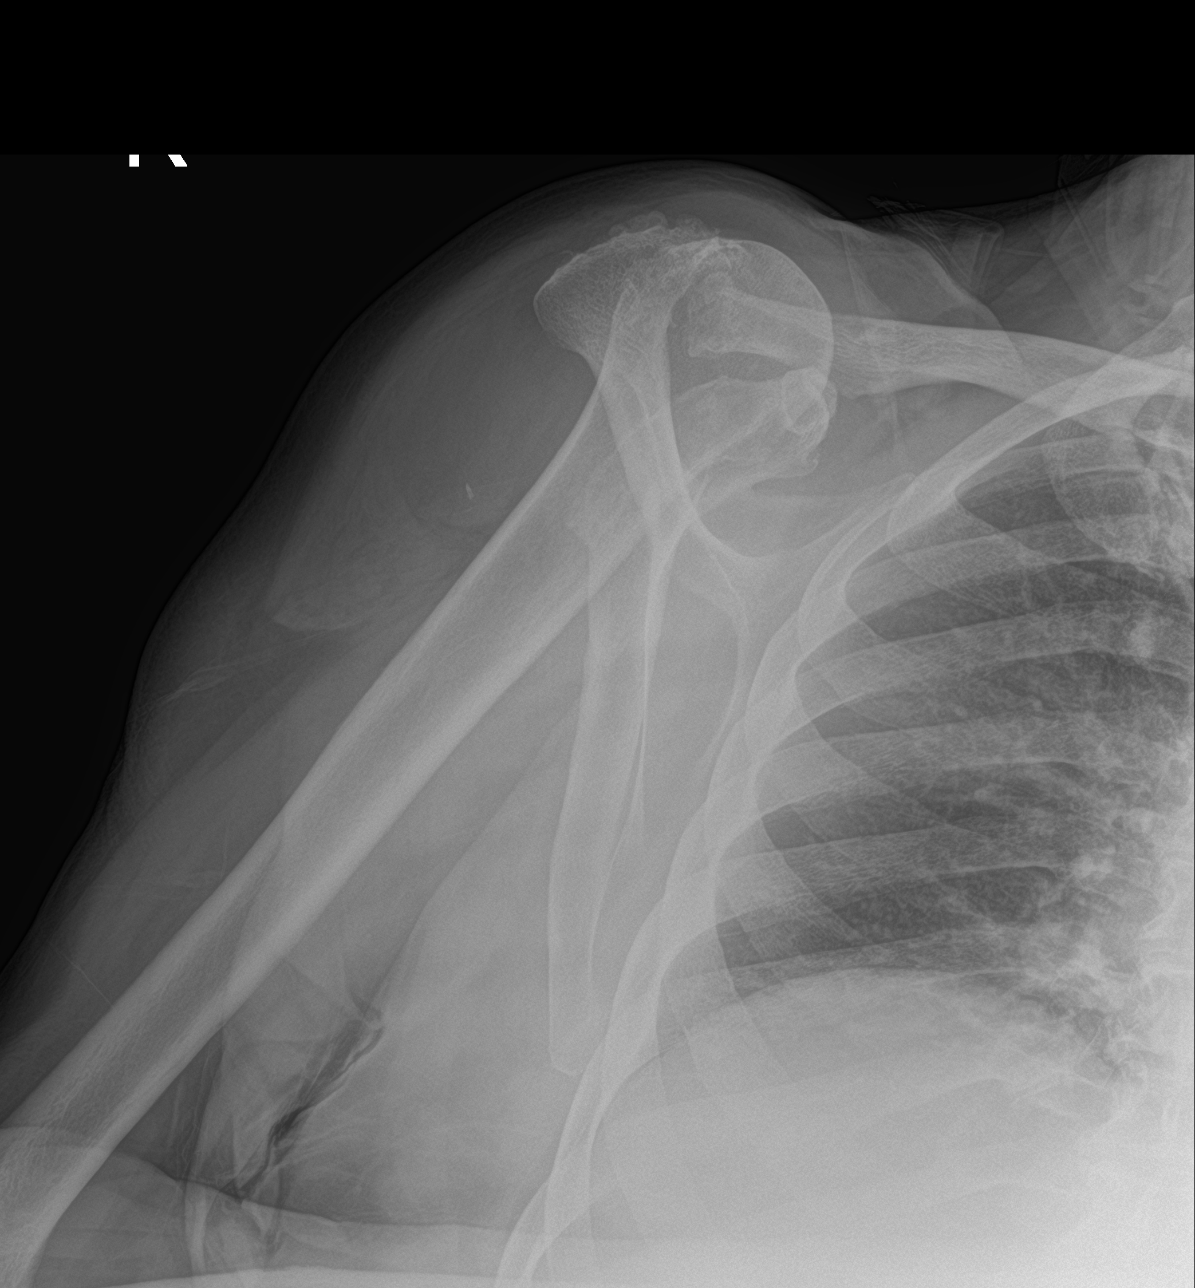

[4 of 4 positions shown; findings below may reference images not displayed]

FINDINGS: Evaluation of the right shoulder is slightly limited by suboptimal
positioning. There is, however, anterosuperior dislocation of the
humeral head in relation of the glenoid fossa. No definite fracture.
Advanced degenerative arthritis of the acromioclavicular joint is
noted. Limited evaluation of the right hemithorax is unremarkable.
IMPRESSION: Anterosuperior dislocation of the right humeral head. Correlation
with peripheral pulse examination of the right upper extremity is
recommended. If abnormal, CT arteriography may be helpful to assess
the axillobrachial vasculature.

## 2021-01-14 NOTE — Progress Notes (Signed)
PHYSICAL MEDICINE & REHABILITATION PROGRESS NOTE  Subjective/Complaints:  No pain c/o still very limited in the Right > Left shoulder, some tingling in finger tips, bowels are doing better off laxatives, bladder doing ok per pt.    ROS: Denies CP, SOB, N/V/D, +pain in hip  Objective: Vital Signs: Blood pressure (!) 107/59, pulse 70, temperature 97.7 F (36.5 C), temperature source Oral, resp. rate 18, height 5\' 7"  (1.702 m), weight 83.2 kg, SpO2 96 %. No results found. Recent Labs    01/14/21 0701  WBC 7.7  HGB 12.8*  HCT 39.5  PLT 343   Recent Labs    01/14/21 0701  NA 141  K 4.2  CL 105  CO2 29  GLUCOSE 132*  BUN 27*  CREATININE 0.88  CALCIUM 9.8    Intake/Output Summary (Last 24 hours) at 01/14/2021 1316 Last data filed at 01/14/2021 1244 Gross per 24 hour  Intake 960 ml  Output 1500 ml  Net -540 ml         Physical Exam: BP (!) 107/59 (BP Location: Right Arm)   Pulse 70   Temp 97.7 F (36.5 C) (Oral)   Resp 18   Ht 5\' 7"  (1.702 m)   Wt 83.2 kg   SpO2 96%   BMI 28.73 kg/m   General: No acute distress Mood and affect are appropriate  Cardiovascular: No JVD.  Irregularly irregular Respiratory: Normal effort.  No stridor.  Bilateral clear to auscultation. GI: Non-distended.  BS +.Soft Skin: Warm and dry.  Intact. Psych: Normal mood.  Normal behavior. Musc:  RIght shoulder ant subluxation  limited abd active and passive but no pain with ROM No edema in extremities.  No tenderness in extremities. Neuro: Alert Sensation intact to LT in BUE Fine motor able to oppose finger to thumb in BUEs Motor: Bilateral upper extremities: Shoulder abduction 2/5, elbow flexion3+ /extension 2+/5, handgrip 4/5, right weaker than left, stable Bilateral lower extremities: 4-/5 proximal distal   Assessment/Plan: 1. Functional deficits which require 3+ hours per day of interdisciplinary therapy in a comprehensive inpatient rehab setting. Physiatrist is  providing close team supervision and 24 hour management of active medical problems listed below. Physiatrist and rehab team continue to assess barriers to discharge/monitor patient progress toward functional and medical goals   Care Tool:  Bathing    Body parts bathed by patient: Right arm, Left arm, Chest, Abdomen, Front perineal area, Right upper leg, Left upper leg   Body parts bathed by helper: Face, Buttocks, Left lower leg, Right lower leg     Bathing assist Assist Level: Moderate Assistance - Patient 50 - 74%     Upper Body Dressing/Undressing Upper body dressing   What is the patient wearing?: Button up shirt    Upper body assist Assist Level: Maximal Assistance - Patient 25 - 49%    Lower Body Dressing/Undressing Lower body dressing      What is the patient wearing?: Pants, Incontinence brief     Lower body assist Assist for lower body dressing: Maximal Assistance - Patient 25 - 49%     Toileting Toileting    Toileting assist Assist for toileting: Total Assistance - Patient < 25%     Transfers Chair/bed transfer  Transfers assist     Chair/bed transfer assist level: Contact Guard/Touching assist Chair/bed transfer assistive device: Programmer, multimedia   Ambulation assist      Assist level: Contact Guard/Touching assist Assistive device: Walker-rolling Max distance: 133 ft  Walk 10 feet activity   Assist     Assist level: Contact Guard/Touching assist Assistive device: Walker-rolling   Walk 50 feet activity   Assist Walk 50 feet with 2 turns activity did not occur: Safety/medical concerns  Assist level: Contact Guard/Touching assist Assistive device: Walker-rolling    Walk 150 feet activity   Assist Walk 150 feet activity did not occur: Safety/medical concerns         Walk 10 feet on uneven surface  activity   Assist Walk 10 feet on uneven surfaces activity did not occur: Safety/medical concerns          Wheelchair     Assist Is the patient using a wheelchair?: Yes Type of Wheelchair: Manual    Wheelchair assist level: Supervision/Verbal cueing Max wheelchair distance: 136 ft    Wheelchair 50 feet with 2 turns activity    Assist        Assist Level: Supervision/Verbal cueing   Wheelchair 150 feet activity     Assist  Wheelchair 150 feet activity did not occur: Safety/medical concerns   Assist Level: Total Assistance - Patient < 25%    Medical Problem List and Plan: 1.  Cervical myelopathy s/p C3-4 ACDF on 01/01/21  Continue CIR 2.  Impaired mobility:  -DVT/anticoagulation:  Pharmaceutical: Heparin--now d/ced Was on Eliquis PTA--held due to surgery--> resumed on 9/25             -antiplatelet therapy: N/A 3. Endstate OA left hip/Pain Management: Oxycodone prn             Continue voltaren gel to left hip QID             Xray showing left hip OA  Controlled with meds on 9/22 Monitor with increased exertion 4. Mood: LCSW to follow for evaluation and support.              -antipsychotic agents: N/A 5. Neuropsych: This patient is capable of making decisions on his own behalf. 6. Bilateral knee abraisons/cervical insicion:  Routine pressure relief measures--monitor incision for healing.              --D/ced bacitracin ointement Continue Aquacell with dry dressing to bilateral knee abrasions.  7. Fluids/Electrolytes/Nutrition: Monitor I/Os.  8. CAF: Monitor HR TID--continue to hold eliquis till 9/23             --On coreg BID.  Filed Weights   01/03/21 1653  Weight: 83.2 kg  9.  Dehydration: Encourage fluid intake. Monitor orthostatic symptoms 10. Hypoxia/ Productive cough: Will down grade diet to dysphagia 3 with aspiration precautions/supervison             --has been cleared to remove collar for meals.              --CXR showing atelectasis  Wean off supplemental oxygen 11.  Prediabetes diet controlled?: Has been off meds for 5 years.  Hemoglobin A1c  5.7 on 9/16 Relatively controlled on 9/21 Monitor with increased mobility 12. HTN: Monitor BP TID--continue Lasix and Cozaar. BP currently soft but as expected for SCI Vitals:   01/14/21 0553 01/14/21 0724  BP: 114/60 (!) 107/59  Pulse: 68 70  Resp: 18   Temp: 97.7 F (36.5 C)   SpO2: 96%   Controlled monitor for orthostatic changes  13.  Macrocytic anemia:   Folate within normal limits on 9/16 Hemoglobin 11.5 on 9/19 14. OSA: Does not use CPAP. 15. Constipation: Has not had BM for a couple of days  Added Senna S.   MiraLAX started on 9/16 16. Overweight BMI 28.82: provide dietary education 17.  Hypokalemia  Potassium 3.9 on 9/19, plan to repeat labs on Friday  Supplemented increased on 9/16  18.  Hypoalbuminemia  Supplement initiated on 9/16 19.  Right> Left shoulder abd limitation with ant subluxation deformity on RIght side which appears chronic . Pt denies hx of shoulder surgery.  No pain with ROM, no xrays in Epic will order shoulder 2 V RIght  LOS: 11 days A FACE TO FACE EVALUATION WAS PERFORMED  Charlett Blake 01/14/2021, 1:16 PM

## 2021-01-14 NOTE — Progress Notes (Addendum)
Physical Therapy Session Note  Patient Details  Name: Logan [name] MRN: [id number] Date of Birth: 1945/05/10  Today's Date: 01/14/2021 PT Individual Time: 1100-1153, 1430-1520 PT Individual Time Calculation (min): 53 min, 50 min   Short Term Goals: Week 1:  PT Short Term Goal 1 (Week 1): Pt will perform supine to sit w/mod assist and LRAD PT Short Term Goal 1 - Progress (Week 1): Met PT Short Term Goal 2 (Week 1): Pt will transfer bed to/from wc w/mod assist and LRAD PT Short Term Goal 2 - Progress (Week 1): Met PT Short Term Goal 3 (Week 1): Pt will ambulate 61f w/min assist and LRAD PT Short Term Goal 3 - Progress (Week 1): Met Week 2:  PT Short Term Goal 1 (Week 2): =LTGs d/t ELOS  Skilled Therapeutic Interventions/Progress Updates:  Ambulation/gait training;Balance/vestibular training;Disease management/prevention;Functional mobility training;DME/adaptive equipment instruction;Neuromuscular re-education;Patient/family education;Psychosocial support;Skin care/wound management;Splinting/orthotics;Therapeutic Activities;Therapeutic Exercise;UE/LE Strength taining/ROM;UE/LE Coordination activities;Wheelchair propulsion/positioning   Session 1: Pt seated in w/c on arrival and agreeable to therapy. No complaint of pain. Gait x 45 ft with CGA and VC for increased step length on R foot. Pt demoed improved gait mechanics with shoe lift and reports improvements in low back and hip pain. Pt transported to therapy gym for time management and energy conservation. Car transfer with supervision and mod VC for technique and hand placement. Gait x 10 ft in the same manner as above. Pt performed step taps on 4" step with RW and CGA 3 x 20 for improved balance, coordination, and LE strength. Step ups 3 x 5 BIL, pt required rest break in the middle of 3rd set d/t fatigue. Pt returned to room and was left with all needs in reach and alarm active.   Session 2: Pt seated in w/c on arrival and agreeable to  therapy. Pt reports some soreness in his right shoulder. Per pt there is concern that his shoulder is dislocated and radiographs are pending. Therapy to tolerance. Pt transported to therapy gym for time management and energy conservation. Sit to stand with mod A this session d/t fatigue, fading to min at times. Attempted to practice bed mobility on mat table. Pt required min A for sit<>sidelying toward L side and max a for sit<>sidelying to R side.  Pt was unable to manage BLE independently and demoed poor motor planning during bed mobility. Pt also reported difficulty breathing while laying flat, likely d/t c collar. Gait x 90 ft with RW and CGA, incr VC for incr step length and upright posture with min improvement. Pt performed nustep on lv 3 x 8  min, with RPE of 13 for improved reciprocal motion and endurance. Pt returned to room and remained in w/c, was left with all needs in reach and alarm active. Pt missed 10 min scheduled time d/t fatigue.   Therapy Documentation Precautions:  Precautions Precautions: Fall, Cervical Required Braces or Orthoses: Cervical Brace Cervical Brace: Hard collar, At all times, Other (comment) Restrictions Weight Bearing Restrictions: No   Therapy/Group: Individual Therapy  OMickel Fuchs9/26/2022, 12:55 PM

## 2021-01-14 NOTE — Progress Notes (Signed)
Occupational Therapy Weekly Progress Note  Patient Details  Name: Logan [name] MRN: [id number] Date of Birth: [birth year]  Beginning of progress report period: January 04, 2021 End of progress report period: January 14, 2021  Today's Date: 01/14/2021 OT Individual Time: 1324-4010 OT Individual Time Calculation (min): 56 min    Patient has met 1 of 4 short term goals.  Pt is very motivated to participate in OT sessions, wanting to focus on RUE ROM and strength/functional use. However, pt's RUE ROM (especially at shoulder) is severely limited and impacting functional skill performance with self care tasks. Pt is Max A with UB dressing, LB dressing, and toileting tasks as he is unable to complete hygiene at this time. Pt is improving functional transfers to/from toilet, bed, wheelchair, etc with CGA and RW. Plan to continue to focus on RUE/BUE ROM and functional use to maximize skill performance.   Patient continues to demonstrate the following deficits: muscle weakness, decreased cardiorespiratoy endurance, impaired timing and sequencing, decreased coordination, and decreased motor planning, and decreased sitting balance, decreased standing balance, decreased postural control, decreased balance strategies, and difficulty maintaining precautions and therefore will continue to benefit from skilled OT intervention to enhance overall performance with BADL, iADL, and Reduce care partner burden.  Patient progressing toward long term goals..  Continue plan of care.  OT Short Term Goals Week 1:  OT Short Term Goal 1 (Week 1): Pt will complete sit<>stand transfers using LRAD with CGA in preperation for dressing and toileting OT Short Term Goal 1 - Progress (Week 1): Met OT Short Term Goal 2 (Week 1): Pt will complete UB dressing with min assist OT Short Term Goal 2 - Progress (Week 1): Not met OT Short Term Goal 3 (Week 1): Pt will increased right shoulder scaption to 50 degrees for functional reaching  tasks. OT Short Term Goal 3 - Progress (Week 1): Progressing toward goal OT Short Term Goal 4 (Week 1): Pt will donn shorts using reacher with min assist. OT Short Term Goal 4 - Progress (Week 1): Progressing toward goal Week 2:  OT Short Term Goal 1 (Week 2): Pt will adhere to cervical precautions consistently during ADL w/ no more than Min cues OT Short Term Goal 2 (Week 2): Pt will increase Right shoulder scaption to 50* to improve functional skill performance OT Short Term Goal 3 (Week 2): Pt will perform BSC/toilet transfers with LRAD and Supervision OT Short Term Goal 4 (Week 2): Pt will perform LB dress with AE PRN with Min A  Skilled Therapeutic Interventions/Progress Updates:    Pt greeted at time of session on toilet with nursing staff, OT assisting with care. No pain throughout session. Pt able to assist with pants over hips, still Max A for toileting for hygiene posteriorly. Ambulated bathroom > wheelchair CGA. Politely declining ADLs, wanting to focus on RUE NMR and ROM. Transported to gym and using 1# for the following with one end on ground and other end grasped in RUE for shoulder flexion/extension, horizontal abduction/adduction, and FWD/backward circles for 1x15 each. Table pushes with and without additional 10# weight for 1x15 each for RUE only. AAROM for RUE as well all joints/planes with cues to decrease compensatory movements and promote proper positioning. Note also trialed UBE at beginning of session but unable to tolerate c/o mild R shoulder discomfort. Transported back to room, alarm on call bell in reach.    Therapy Documentation Precautions:  Precautions Precautions: Fall, Cervical Required Braces or Orthoses: Cervical Brace Cervical Brace: Hard  collar, At all times, Other (comment) Restrictions Weight Bearing Restrictions: No    Therapy/Group: Individual Therapy  Viona Gilmore 01/14/2021, 7:14 AM

## 2021-01-15 ENCOUNTER — Inpatient Hospital Stay (HOSPITAL_COMMUNITY): Payer: Medicare HMO

## 2021-01-15 LAB — GLUCOSE, CAPILLARY
Glucose-Capillary: 98 mg/dL (ref 70–99)
Glucose-Capillary: 122 mg/dL — ABNORMAL HIGH (ref 70–99)
Glucose-Capillary: 129 mg/dL — ABNORMAL HIGH (ref 70–99)

## 2021-01-15 IMAGING — MR MR SHOULDER*R* W/O CM
4 of 5 series · 19 of 40 positions shown · non-contrast
Comparison: Radiographs [DATE]

CLINICAL DATA: Right shoulder pain.

EXAM:
MRI OF THE RIGHT SHOULDER WITHOUT CONTRAST
TECHNIQUE: Multiplanar, multisequence MR imaging of the shoulder was performed.
No intravenous contrast was administered.

[Series 5: PD fat-sat · axial · 4.0mm · 0.27mm/px · z∈[-17,+74]mm · 8 of 21 slices shown]
[im 1/21]
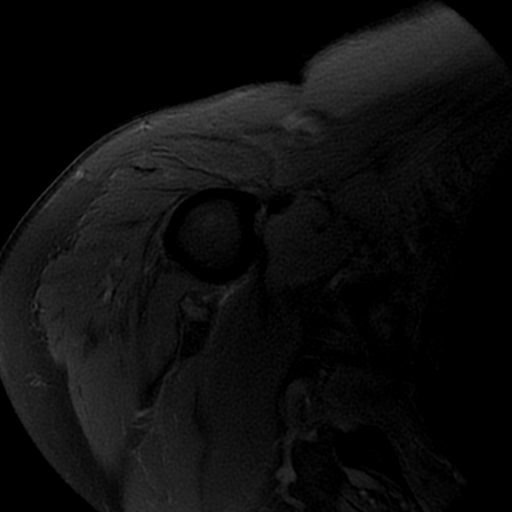
[im 3/21]
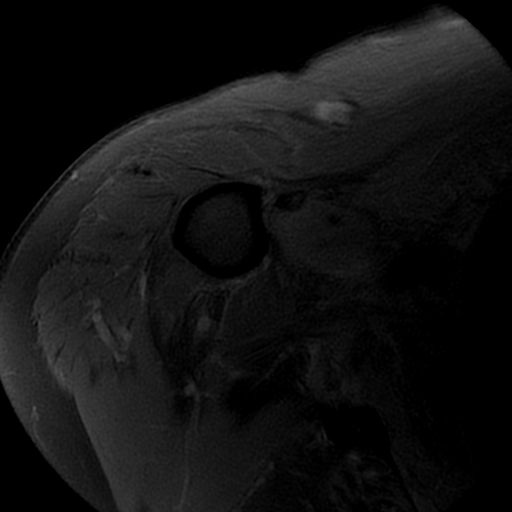
[im 6/21]
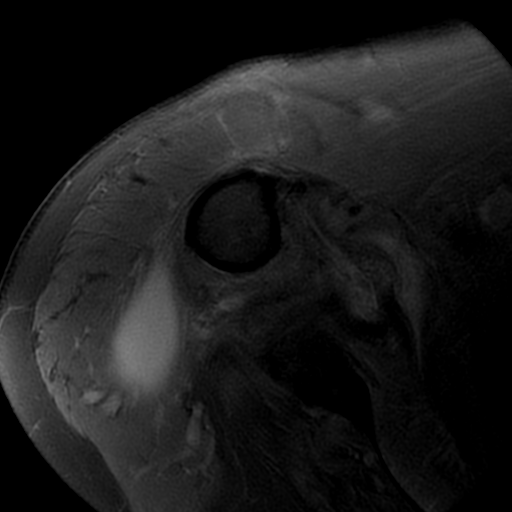
[im 9/21]
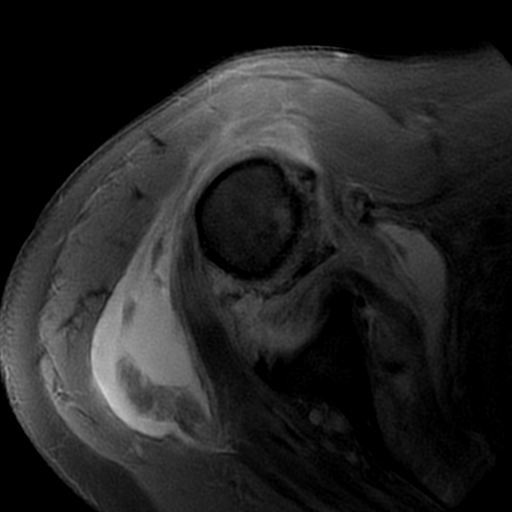
[im 12/21]
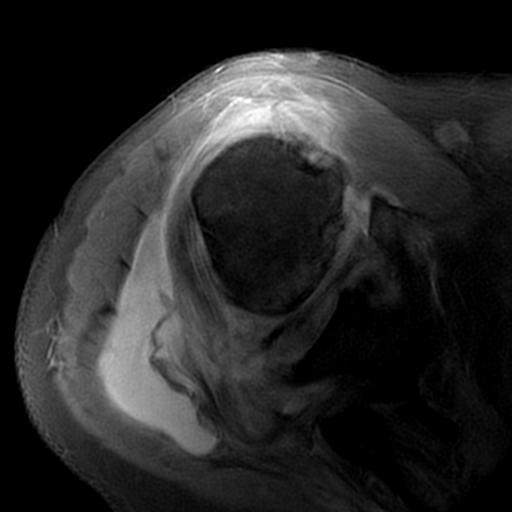
[im 15/21]
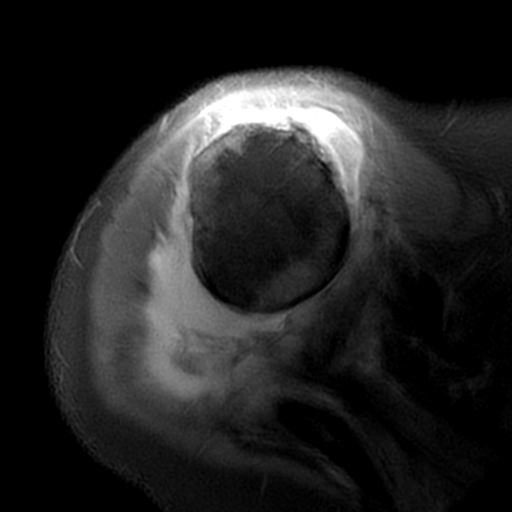
[im 18/21]
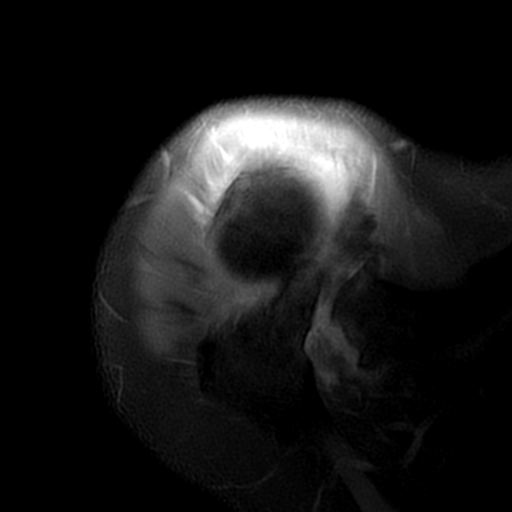
[im 21/21]
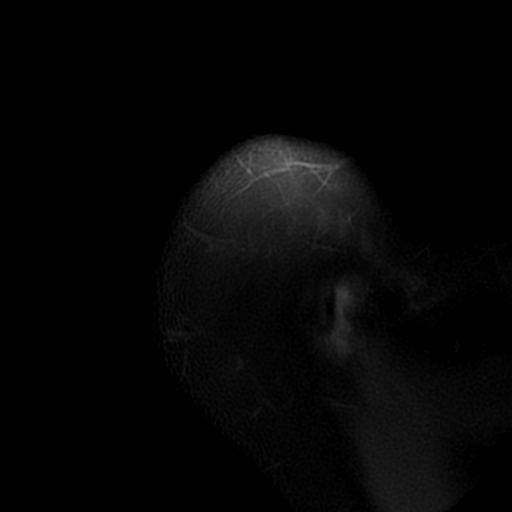

[Series 6: T2 fat-sat · sagittal · 4.0mm · 0.27mm/px · 3 of 19 slices shown (1 of 2)]
[im 4/19]
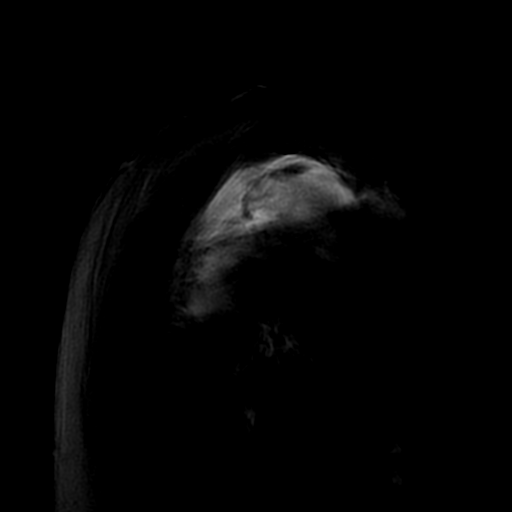
[im 10/19]
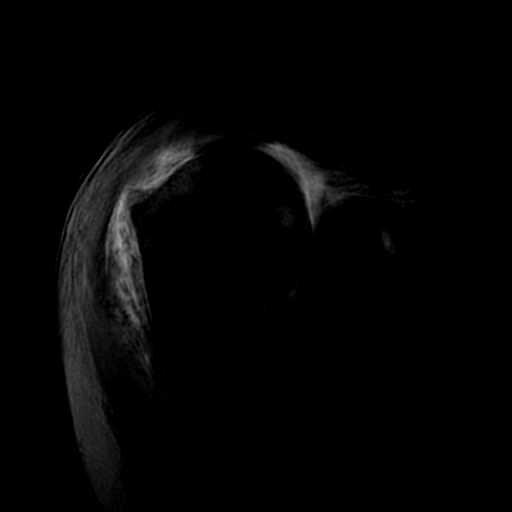
[im 16/19]
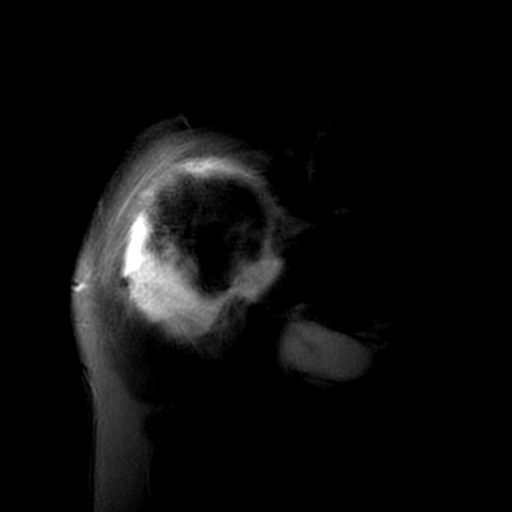

[Series 7: PD · sagittal · 4.0mm · 0.27mm/px · 5 of 19 slices shown]
[im 1/19]
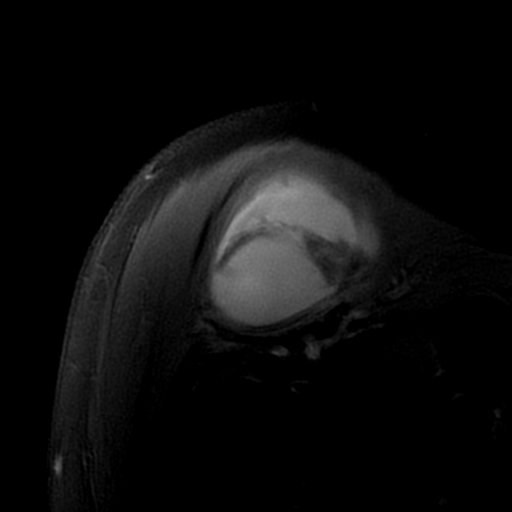
[im 4/19]
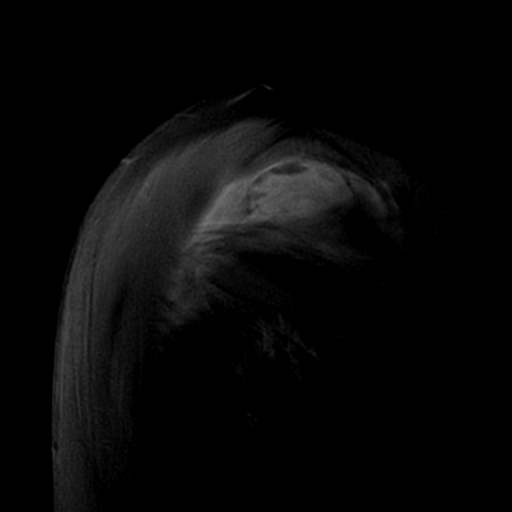
[im 7/19]
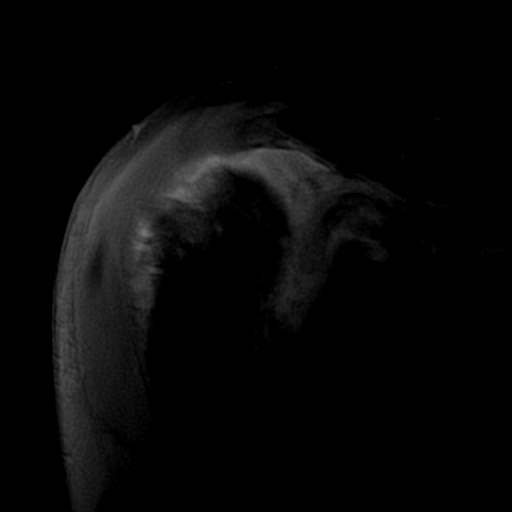
[im 10/19]
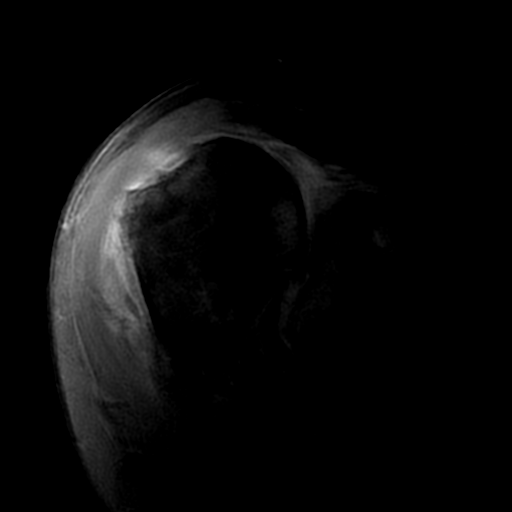
[im 16/19]
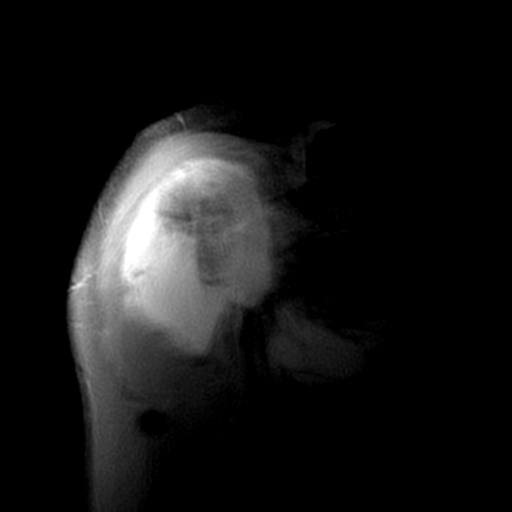

[Series 8: T2 fat-sat · coronal · 3.0mm · 0.27mm/px · 3 of 25 slices shown (2 of 2)]
[im 4/25]
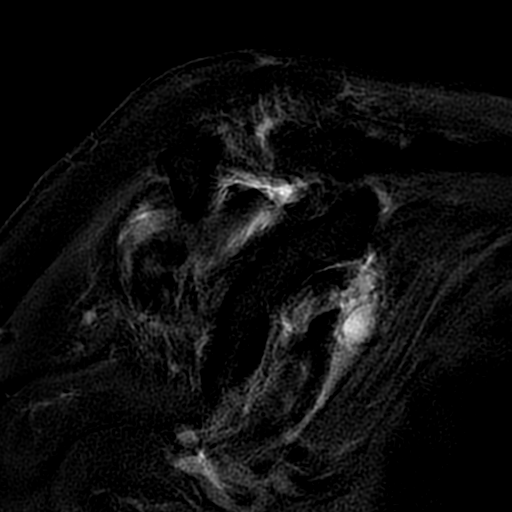
[im 13/25]
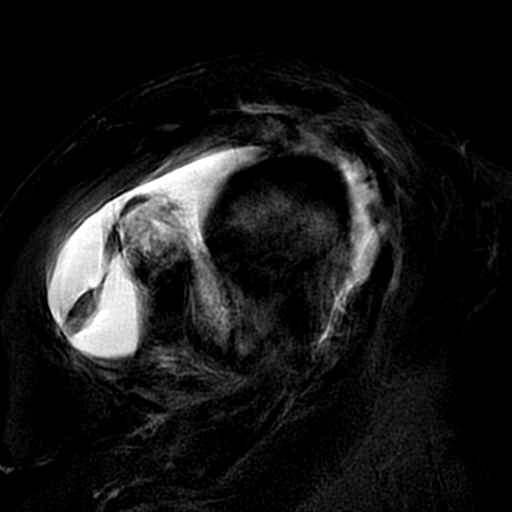
[im 22/25]
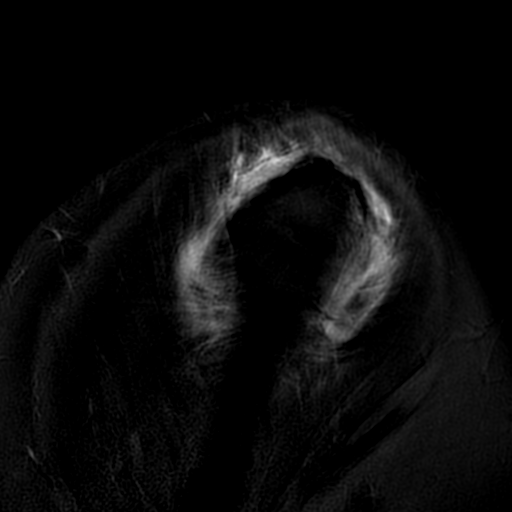

[19 of 40 positions shown; findings below may reference images not displayed]

FINDINGS: Examination is quite limited due to significant motion artifact.

Rotator cuff: Large full-thickness retracted rotator cuff tear. The
supraspinatus, infraspinatus and subscapularis tendons are
completely torn and retracted. The subscapularis tendon is torn and
flipped into the glenohumeral joint. Associated marked narrowing of
the humeroacromial space with superior and anterior subluxation of
the humeral head in relation to the glenoid fossa.

Muscles:  Significant fatty atrophy of the rotator cuff muscles.

Biceps long head: Not identified. Likely chronically torn and
retracted.

Acromioclavicular Joint: Moderate degenerative changes. Type 2
acromion. No significant lateral downsloping. Mild subacromial
spurring.

Glenohumeral Joint: Moderate degenerative changes. Large joint
effusion and moderate synovitis.

Labrum:  Degenerated and torn.

Bones: Bone contusions involving the humeral head but no definite
fracture. No glenoid fractures are identified. Bone contusion
involving the coracoid process without definite fracture.

Other: Large amount of fluid in the subacromial/subdeltoid bursa due
to the large rotator cuff tear.
IMPRESSION: 1. Large full-thickness retracted rotator cuff tear as described
above. See associated chronic fatty atrophy of the rotator cuff
muscles.
2. Superior and lateral subluxation of the humeral head.
3. Likely chronically torn and retracted long head biceps tendon.
4. Bone contusions involving the humeral head and coracoid process
without definite fracture.
5. Large joint effusion and moderate synovitis.

## 2021-01-15 IMAGING — CT CT SHOULDER*R* W/O CM
2 series · 15 of 20 positions shown, 18 images · non-contrast
Comparison: Radiographs and MRI.

CLINICAL DATA: Surgical planning.

EXAM:
CT OF THE UPPER RIGHT EXTREMITY WITHOUT CONTRAST
TECHNIQUE: Multidetector CT imaging of the upper right extremity was performed
according to the standard protocol.

[Series 6: axial st · axial · 0.59mm/px · z∈[-912,-718]mm · 12 of 115 slices shown, 15 images]
[im 9/115  soft-tissue]
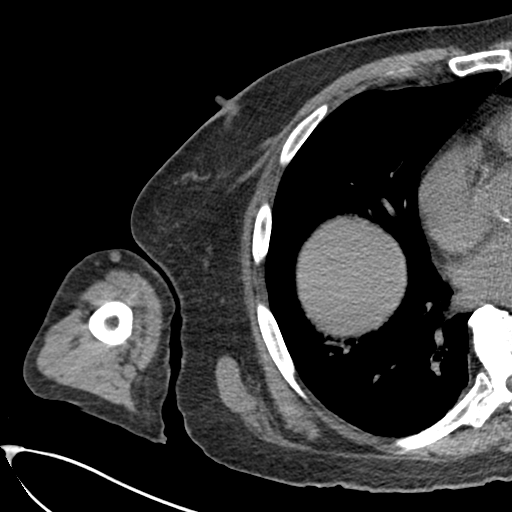
[im 9/115  bone]
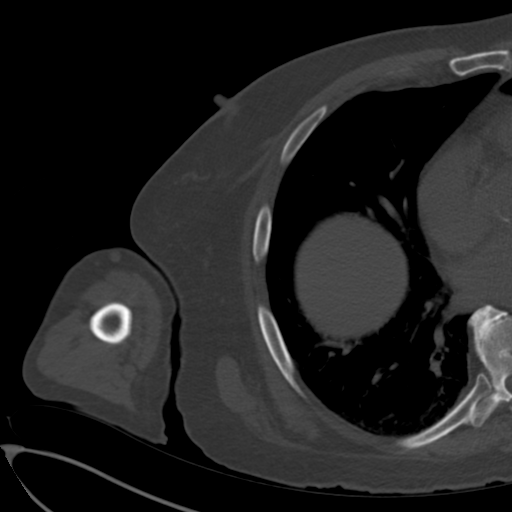
[im 18/115  bone]
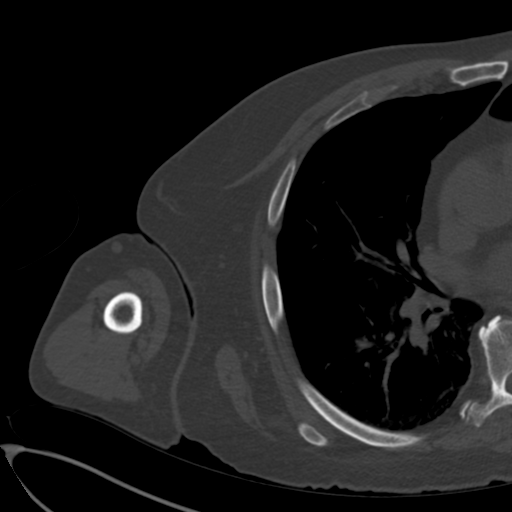
[im 27/115  bone]
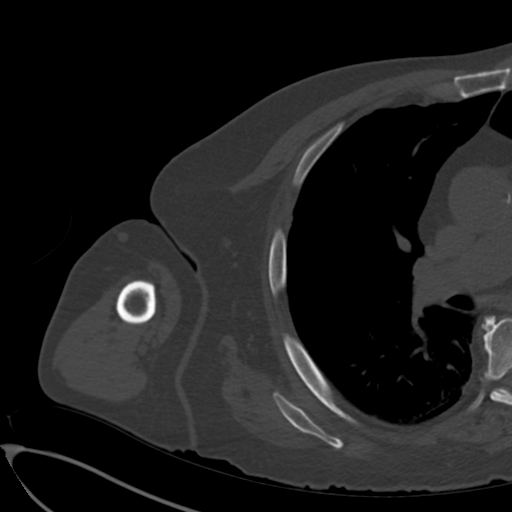
[im 36/115  bone]
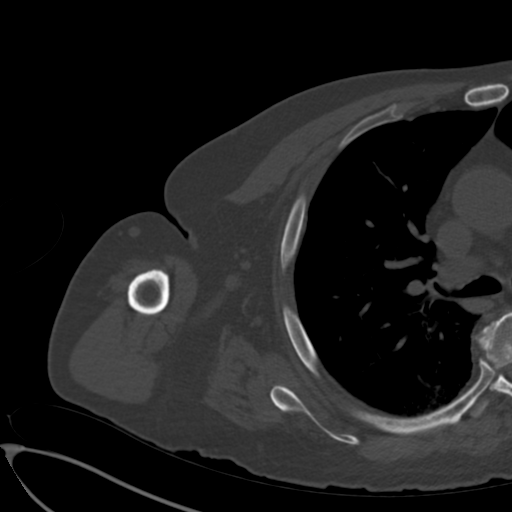
[im 44/115  soft-tissue]
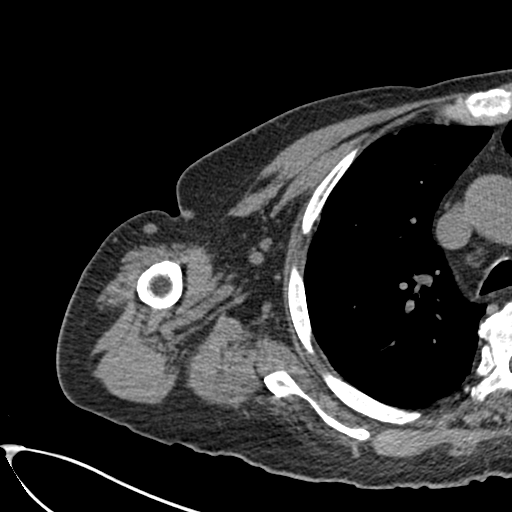
[im 44/115  bone]
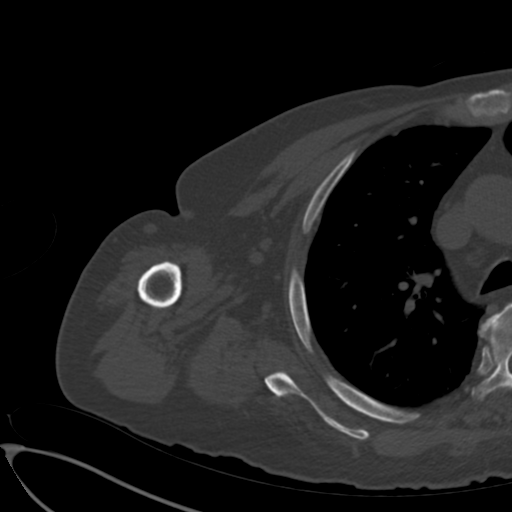
[im 53/115  bone]
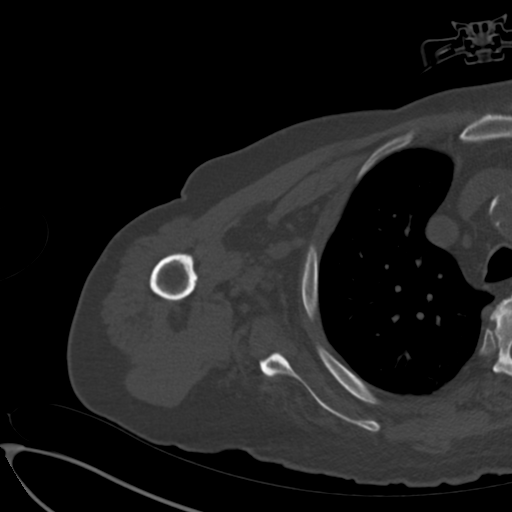
[im 62/115  bone]
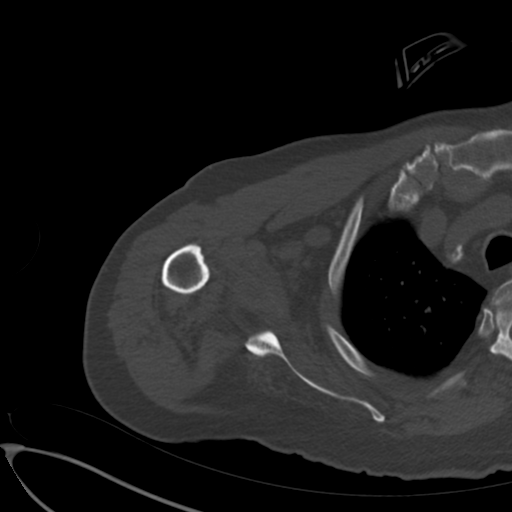
[im 71/115  bone]
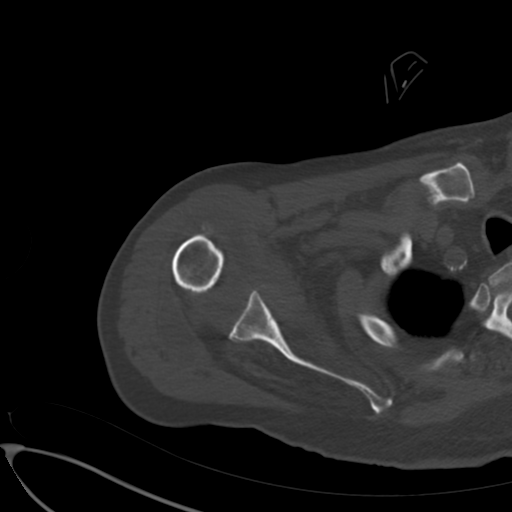
[im 79/115  soft-tissue]
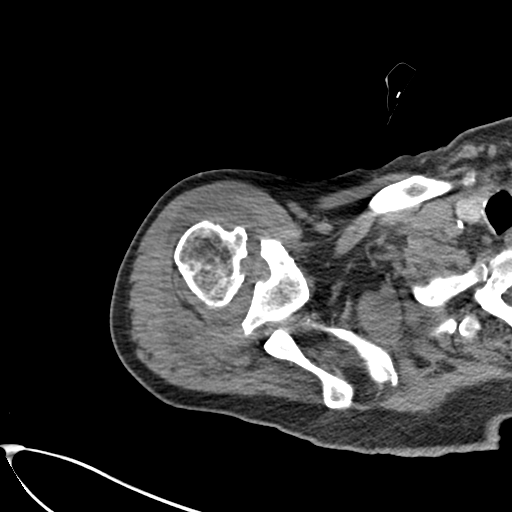
[im 79/115  bone]
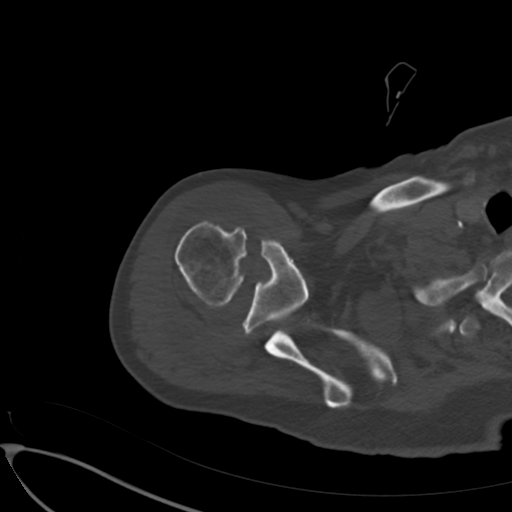
[im 88/115  bone]
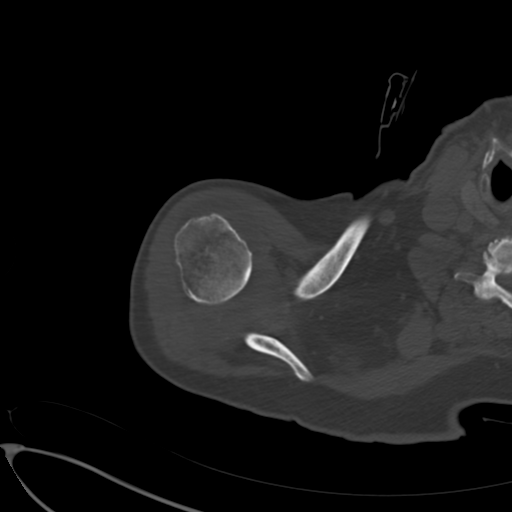
[im 97/115  bone]
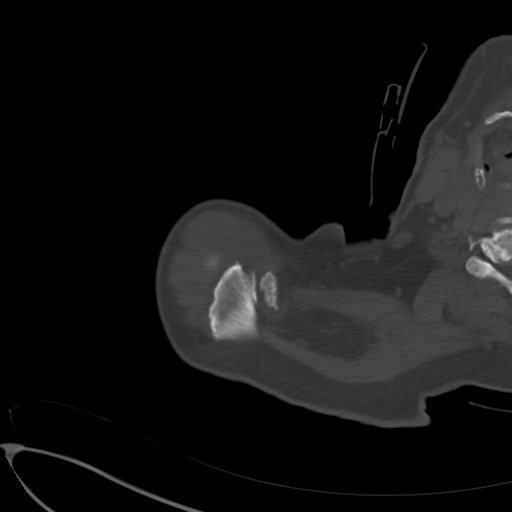
[im 106/115  bone]
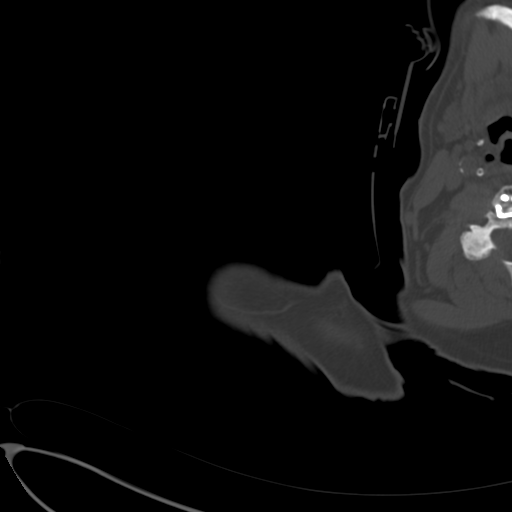

[Series 9: (id) st cor · coronal · 0.53mm/px · 3 of 136 slices shown]
[im 43/136  bone]
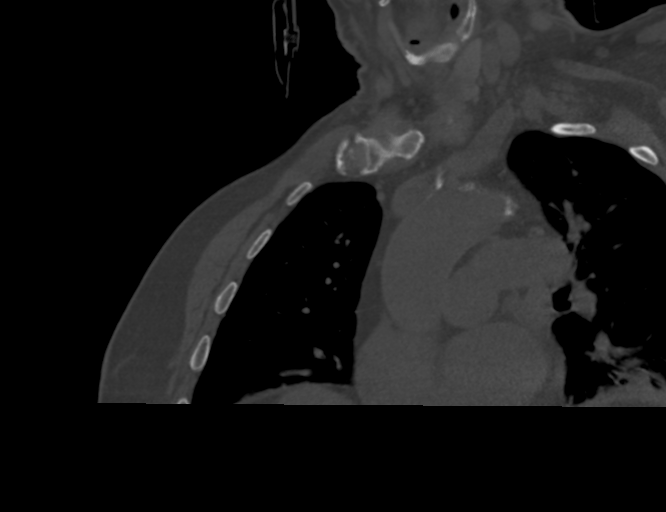
[im 60/136  bone]
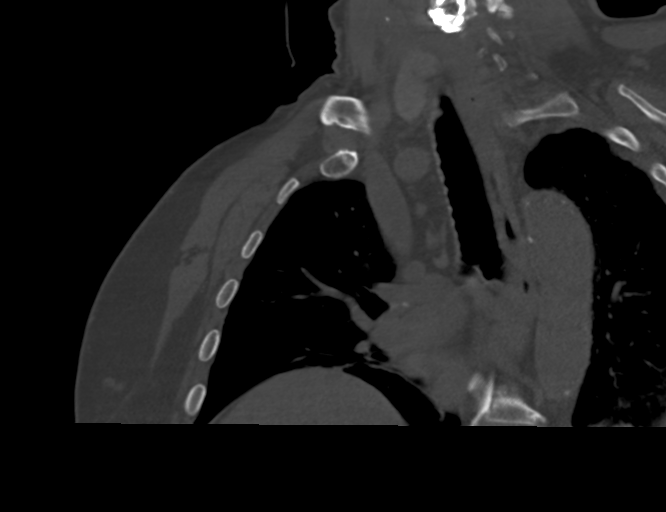
[im 76/136  bone]
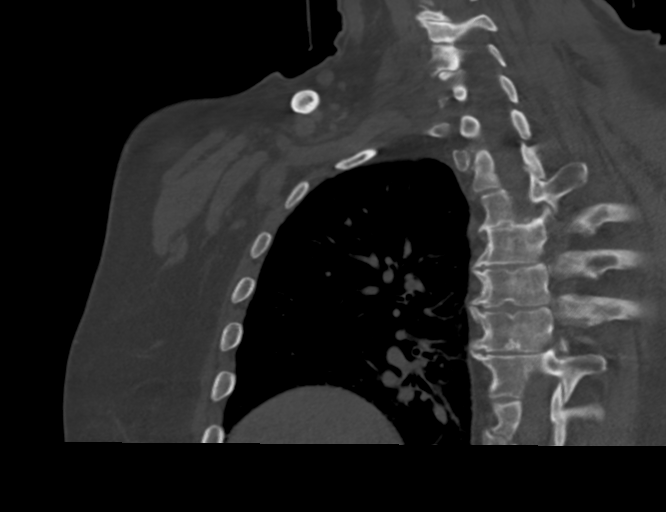

[15 of 20 positions shown; findings below may reference images not displayed]

FINDINGS: Moderate glenohumeral joint degenerative changes.

Moderate superior and lateral subluxation of the humeral head in
relation to the glenoid fossa likely due to the large full-thickness
retracted rotator cuff tear. Marked narrowing of the humeroacromial
space with the humeral head riding under the acromion. No acute
fractures are identified. No AVN. The AC joint is intact. The
acromion is type 2 in shape. No significant lateral downsloping or
subacromial spurring.

Large full-thickness retracted rotator cuff tear as demonstrated on
the MRI. There is also associated chronic fatty atrophy of the
rotator cuff muscles. The simple lipoma is noted in the proximal
supraspinatus muscle.

Large joint effusion and large amount of fluid in the
subacromial/subdeltoid bursa.

The visualized right ribs are intact and the visualized right lung
is grossly clear. No worrisome pulmonary lesions.
IMPRESSION: 1. Large full-thickness retracted rotator cuff tear as demonstrated
on the MRI. Associated severe chronic fatty atrophy of the rotator
cuff muscles.
2. Superior and lateral subluxation of the humeral head in relation
to the glenoid fossa.
3. Moderate glenohumeral joint degenerative changes.
4. Large joint effusion and large amount of fluid in the
subacromial/subdeltoid bursa.

## 2021-01-15 MED ORDER — DIAZEPAM 5 MG PO TABS
5.0000 mg | ORAL_TABLET | Freq: Once | ORAL | Status: AC
  Administered 2021-01-15: 5 mg via ORAL
  Filled 2021-01-15: qty 1

## 2021-01-15 NOTE — Progress Notes (Signed)
Physical Therapy Session Note  Patient Details  Name: Logan [name] MRN: [id number] Date of Birth: [birth year]  Today's Date: 01/15/2021 PT Individual Time: 0800-0900 PT Individual Time Calculation (min): 60 min   Short Term Goals: Week 2:  PT Short Term Goal 1 (Week 2): =LTGs d/t ELOS  Skilled Therapeutic Interventions/Progress Updates:  Pt received seated in w/c in room, agreeable to PT session. Per Dr. Dagoberto Ligas pt has R shoulder dislocation and unable to use RUE in therapy sessions until ortho consult. Provided pt with HW to trial use of to adhere to RUE precautions. Sit to stand with max A from w/c to [facility] due to low seat height. Pt unable to take steps to perform stand pivot transfer with HW due to pain and arthritis in LLE. Squat pivot transfer w/c to/from mat table with max A progressing to mod A. Sit to stand x 10 reps from elevated mat table to [facility] with min A, gradual decrease in surface height as reps progress for increased challenge. Attempt to take steps with HW, pt with fair tolerance for stance on LLE and unable to take steps with RLE. Sit to stand in // bars with max A with use of LUE to assist with stand, cues for anterior weight shift. Attempt steps in // bars but pt again unable to tolerate stance on LLE 2/2 OA. Standing mini-squats 3 x 10 reps with LUE support and min A for balance, cues for correct exercise performance. Pt left seated in w/c in room with needs in reach, quick release belt and chair alarm in place at end of session.  Therapy Documentation Precautions:  Precautions Precautions: Fall, Cervical Required Braces or Orthoses: Cervical Brace Cervical Brace: Hard collar, At all times, Other (comment) Restrictions Weight Bearing Restrictions: No   Therapy/Group: Individual Therapy   Excell Seltzer, PT, DPT, CSRS  01/15/2021, 11:18 AM

## 2021-01-15 NOTE — Progress Notes (Signed)
Physical Therapy Weekly Progress Note  Patient Details  Name: Logan [name] MRN: [id number] Date of Birth: Jul 06, 1945  Beginning of progress report period: January 04, 2021 End of progress report period: January 15, 2021  Today's Date: 01/15/2021 PT Individual Time: 1030-1103 PT Individual Time Calculation (min): 33 min   Patient has met 3 of 3 short term goals.  Gait up to 90 ft with RW and CGA. 3" stairs x 8 with BIL handrails and CGA. Pt with chronic R shoulder dislocation, WBAT but limited by pain. Bed mobility with mod-max A to maintain cervical precautions.   Patient continues to demonstrate the following deficits muscle weakness, decreased cardiorespiratoy endurance, and decreased standing balance and decreased balance strategies and therefore will continue to benefit from skilled PT intervention to increase functional independence with mobility.  Patient progressing toward long term goals..  Continue plan of care.  PT Short Term Goals Week 1:  PT Short Term Goal 1 (Week 1): Pt will perform supine to sit w/mod assist and LRAD PT Short Term Goal 1 - Progress (Week 1): Met PT Short Term Goal 2 (Week 1): Pt will transfer bed to/from wc w/mod assist and LRAD PT Short Term Goal 2 - Progress (Week 1): Met PT Short Term Goal 3 (Week 1): Pt will ambulate 64f w/min assist and LRAD PT Short Term Goal 3 - Progress (Week 1): Met Week 2:  PT Short Term Goal 1 (Week 2): =LTGs d/t ELOS  Skilled Therapeutic Interventions/Progress Updates:  Ambulation/gait training;Balance/vestibular training;Disease management/prevention;Functional mobility training;DME/adaptive equipment instruction;Neuromuscular re-education;Patient/family education;Psychosocial support;Skin care/wound management;Splinting/orthotics;Therapeutic Activities;Therapeutic Exercise;UE/LE Strength taining/ROM;UE/LE Coordination activities;Wheelchair propulsion/positioning   Pt seated in w/c on arrival and agreeable to therapy. Pt  reports aching pain in his R shoulder, not rated. Nursing administered tylenol during session. Pt had continent bowel void, mod I with urinal. Pt transported to therapy gym for time management and energy conservation. Sit to stand with min A and RW. Gait x 3 ft with RW, demoed decr gait speed with difficulty advancing BLE. 3" stair navigation x 8 with CGA and BUE support. Pt reported difficulty with pain when trying to use RUE for balance. Pt returned to w/c and requested to rest d/t pain and fatigue. Pt returned to room and remained in w/c, was left with all needs in reach and alarm active. Pt missed 27 min of scheduled PT d/t pain and fatigue.   Therapy Documentation Precautions:  Precautions Precautions: Fall, Cervical Required Braces or Orthoses: Cervical Brace Cervical Brace: Hard collar, At all times, Other (comment) Restrictions Weight Bearing Restrictions: No General: PT Amount of Missed Time (min): 27 Minutes PT Missed Treatment Reason: Pain;Patient fatigue (R shoulder pain)    Therapy/Group: Individual Therapy  OMickel Fuchs9/27/2022, 11:13 AM

## 2021-01-15 NOTE — Consult Note (Signed)
Reason for Consult:Right shoulder dislocation Referring Physician: Courtney Heys Time called: 1601 Time at bedside: 0836   Logan [name] is an 75 y.o. male.  HPI: Timmothy Sours was admitted to CIR about 2 weeks ago following ACDF for cervical radiculopathy. He's been c/o right shoulder pain that's been going on for months. He was noted to have very decreased ROM in the shoulder and x-rays showed a shoulder dislocation. He does not recall any trauma that could have caused it. Looking back at other x-rays it appears it's probably been dislocated for months at minimum. He is RHD.  Past Medical History:  Diagnosis Date   Anxiety    Atherosclerotic renal artery stenosis, unilateral (HCC) 02/28/2014   Atrial fibrillation (HCC)    Bilateral lower extremity edema    Chronic diastolic heart failure ([place]) 10/21/2019   Diabetes mellitus without complication (HCC)    DVT (deep venous thrombosis) (HCC)    Esophageal reflux    Essential hypertension 10/21/2019   Gout    Hyperlipidemia    Hypertension    Mixed hyperlipidemia 10/21/2019   Morbid obesity ([place])    Obesity (BMI 30-39.9) 08/24/2019   Orthopnea    Osteoarthritis    Persistent atrial fibrillation ([place]) 10/21/2019   Seasonal allergies    Sleep apnea     Past Surgical History:  Procedure Laterality Date   ABDOMINAL AORTAGRAM N/A 02/28/2014   Procedure: ABDOMINAL Maxcine Ham;  Surgeon: Serafina Mitchell, MD;  Location: [facility] CATH LAB;  Service: Cardiovascular;  Laterality: N/A;   ANTERIOR CERVICAL DECOMP/DISCECTOMY FUSION N/A 01/01/2021   Procedure: ACDF C3-4;  Surgeon: Dawley, Theodoro Doing, DO;  Location: [place];  Service: Neurosurgery;  Laterality: N/A;   APPENDECTOMY     ATRIAL FIBRILLATION ABLATION N/A 02/29/2020   Procedure: ATRIAL FIBRILLATION ABLATION;  Surgeon: Constance Haw, MD;  Location: [place] CV LAB;  Service: Cardiovascular;  Laterality: N/A;   CARDIOVERSION N/A 10/18/2020   Procedure: CARDIOVERSION (CATH LAB);  Surgeon: Constance Haw,  MD;  Location: [place] CV LAB;  Service: Cardiovascular;  Laterality: N/A;   CARPAL TUNNEL RELEASE Left     Family History  Problem Relation Age of Onset   Heart disease Mother    Cancer Father        throat cancer   Heart disease Brother    Atrial fibrillation Brother     Social History:  reports that he has quit smoking. He has quit using smokeless tobacco.  His smokeless tobacco use included chew. He reports that he does not drink alcohol and does not use drugs.  Allergies: No Known Allergies  Medications: I have reviewed the patient's current medications.  Results for orders placed or performed during the hospital encounter of 01/03/21 (from the past 48 hour(s))  Glucose, capillary     Status: Abnormal   Collection Time: 01/13/21 11:26 AM  Result Value Ref Range   Glucose-Capillary 297 (H) 70 - 99 mg/dL    Comment: Glucose reference range applies only to samples taken after fasting for at least 8 hours.  Glucose, capillary     Status: Abnormal   Collection Time: 01/13/21  4:43 PM  Result Value Ref Range   Glucose-Capillary 111 (H) 70 - 99 mg/dL    Comment: Glucose reference range applies only to samples taken after fasting for at least 8 hours.  Glucose, capillary     Status: Abnormal   Collection Time: 01/13/21  8:58 PM  Result Value Ref Range   Glucose-Capillary 112 (H) 70 -  99 mg/dL    Comment: Glucose reference range applies only to samples taken after fasting for at least 8 hours.  Glucose, capillary     Status: None   Collection Time: 01/14/21  5:49 AM  Result Value Ref Range   Glucose-Capillary 91 70 - 99 mg/dL    Comment: Glucose reference range applies only to samples taken after fasting for at least 8 hours.  Basic metabolic panel     Status: Abnormal   Collection Time: 01/14/21  7:01 AM  Result Value Ref Range   Sodium 141 135 - 145 mmol/L   Potassium 4.2 3.5 - 5.1 mmol/L   Chloride 105 98 - 111 mmol/L   CO2 29 22 - 32 mmol/L   Glucose, Bld 132 (H) 70  - 99 mg/dL    Comment: Glucose reference range applies only to samples taken after fasting for at least 8 hours.   BUN 27 (H) 8 - 23 mg/dL   Creatinine, Ser 0.88 0.61 - 1.24 mg/dL   Calcium 9.8 8.9 - 10.3 mg/dL   GFR, Estimated >60 >60 mL/min    Comment: (NOTE) Calculated using the CKD-EPI Creatinine Equation (2021)    Anion gap 7 5 - 15    Comment: Performed at [facility] [address], [place], [place] [address]  CBC     Status: Abnormal   Collection Time: 01/14/21  7:01 AM  Result Value Ref Range   WBC 7.7 4.0 - 10.5 K/uL   RBC 3.76 (L) 4.22 - 5.81 MIL/uL   Hemoglobin 12.8 (L) 13.0 - 17.0 g/dL   HCT 39.5 39.0 - 52.0 %   MCV 105.1 (H) 80.0 - 100.0 fL   MCH 34.0 26.0 - 34.0 pg   MCHC 32.4 30.0 - 36.0 g/dL   RDW 15.3 11.5 - 15.5 %   Platelets 343 150 - 400 K/uL   nRBC 0.0 0.0 - 0.2 %    Comment: Performed at [facility], [place] [address], [place], [place] [address]  Glucose, capillary     Status: None   Collection Time: 01/14/21 12:08 PM  Result Value Ref Range   Glucose-Capillary 76 70 - 99 mg/dL    Comment: Glucose reference range applies only to samples taken after fasting for at least 8 hours.  Glucose, capillary     Status: None   Collection Time: 01/14/21  5:04 PM  Result Value Ref Range   Glucose-Capillary 92 70 - 99 mg/dL    Comment: Glucose reference range applies only to samples taken after fasting for at least 8 hours.  Glucose, capillary     Status: Abnormal   Collection Time: 01/14/21  8:46 PM  Result Value Ref Range   Glucose-Capillary 112 (H) 70 - 99 mg/dL    Comment: Glucose reference range applies only to samples taken after fasting for at least 8 hours.  Glucose, capillary     Status: None   Collection Time: 01/15/21  6:12 AM  Result Value Ref Range   Glucose-Capillary 98 70 - 99 mg/dL    Comment: Glucose reference range applies only to samples taken after fasting for at least 8 hours.    DG Shoulder Right  Result Date:  01/14/2021 CLINICAL DATA:  Right shoulder deformity EXAM: RIGHT SHOULDER - 2+ VIEW COMPARISON:  None. FINDINGS: Evaluation of the right shoulder is slightly limited by suboptimal positioning. There is, however, anterosuperior dislocation of the humeral head in relation of the glenoid fossa. No definite fracture. Advanced  degenerative arthritis of the acromioclavicular joint is noted. Limited evaluation of the right hemithorax is unremarkable. IMPRESSION: Anterosuperior dislocation of the right humeral head. Correlation with peripheral pulse examination of the right upper extremity is recommended. If abnormal, CT arteriography may be helpful to assess the axillobrachial vasculature. Electronically Signed   By: Fidela Salisbury M.D.   On: 01/14/2021 22:35    Review of Systems  HENT:  Negative for ear discharge, ear pain, hearing loss and tinnitus.   Eyes:  Negative for photophobia and pain.  Respiratory:  Negative for cough and shortness of breath.   Cardiovascular:  Negative for chest pain.  Gastrointestinal:  Negative for abdominal pain, nausea and vomiting.  Genitourinary:  Negative for dysuria, flank pain, frequency and urgency.  Musculoskeletal:  Positive for arthralgias (Right shoulder). Negative for back pain, myalgias and neck pain.  Neurological:  Negative for dizziness and headaches.  Hematological:  Does not bruise/bleed easily.  Psychiatric/Behavioral:  The patient is not nervous/anxious.   Blood pressure (!) 115/56, pulse 69, temperature 97.6 F (36.4 C), temperature source Oral, resp. rate 20, height 5\' 7"  (1.702 m), weight 83.2 kg, SpO2 94 %. Physical Exam Constitutional:      General: He is not in acute distress.    Appearance: He is well-developed. He is not diaphoretic.  HENT:     Head: Normocephalic and atraumatic.  Eyes:     General: No scleral icterus.       Right eye: No discharge.        Left eye: No discharge.     Conjunctiva/sclera: Conjunctivae normal.  Neck:      Comments: C-collar Cardiovascular:     Rate and Rhythm: Normal rate and regular rhythm.  Pulmonary:     Effort: Pulmonary effort is normal. No respiratory distress.  Musculoskeletal:     Comments: Right shoulder, elbow, wrist, digits- no skin wounds, mild diffuse TTP shoulder, little to no true shoulder motion, no instability  Sens  Ax/R/M/U intact  Mot   Ax/ R/ PIN/ M/ AIN/ U intact  Rad 2+  Skin:    General: Skin is warm and dry.  Neurological:     Mental Status: He is alert.  Psychiatric:        Mood and Affect: Mood normal.        Behavior: Behavior normal.    Assessment/Plan: Right shoulder dislocation -- Acutely do not recommend any intervention. He may be WBAT RUE. Will eventually need reverse total shoulder. Will get preparatory MRI and CT while he is here. F/u with Dr. Mable Fill after discharge to discuss surgery. Would also greatly benefit from left THA.    Lisette Abu, PA-C Orthopedic Surgery [phone] 01/15/2021, 9:47 AM

## 2021-01-15 NOTE — Progress Notes (Signed)
Occupational Therapy Session Note  Patient Details  Name: Logan [name] MRN: [id number] Date of Birth: 07/05/1945  Today's Date: 01/15/2021 OT Individual Time: 1530-1630 OT Individual Time Calculation (min): 60 min    Short Term Goals: Week 2:  OT Short Term Goal 1 (Week 2): Pt will adhere to cervical precautions consistently during ADL w/ no more than Min cues OT Short Term Goal 2 (Week 2): Pt will increase Right shoulder scaption to 50* to improve functional skill performance OT Short Term Goal 3 (Week 2): Pt will perform BSC/toilet transfers with LRAD and Supervision OT Short Term Goal 4 (Week 2): Pt will perform LB dress with AE PRN with Min A  Skilled Therapeutic Interventions/Progress Updates:    Pt greeted at time of session sitting up in wheelchair, no pain reported throughout session. Reviewed R shoulder dislocation and that the pt is WBAT per MD but this also explains RUE deficits. Pt wanting to shave, stand pivot wheelchair > bed CGA and once in supine felt urge for BM. Supine > sit Mod A and when ambulating to bathroom had incontinent episodd of BM. Pt able to use LUE to help doff clothing in standing and had small remaining BM on commode. Pt able to use washcloths after set up to assist with hygiene but because of smear from being in brief, therapist assisting for thoroughness. Overall Mod A with toileting for clothing and fully cleaning posteriorly. Discussion at this time regarding potentially installing bidet, pt open to this option and plan for further education during sessions. Ambulated bathroom > bed CGA and sit > supine Min A. Doffed collar with neck supported and assisted with shaving pt for comfort. Once finished, donned shirt at EOB Max A, stand pivot back to wheelchair CGA. Alarm on call bell in reach and collar in place.   Therapy Documentation Precautions:  Precautions Precautions: Fall, Cervical Required Braces or Orthoses: Cervical Brace Cervical Brace: Hard collar,  At all times, Other (comment) Restrictions Weight Bearing Restrictions: No     Therapy/Group: Individual Therapy  Viona Gilmore 01/15/2021, 7:27 AM

## 2021-01-15 NOTE — Patient Care Conference (Signed)
Inpatient RehabilitationTeam Conference and Plan of Care Update Date: 01/15/2021   Time: 11:54 AM    Patient Name: Logan [name]      Medical Record Number: [id number]  Date of Birth: Apr 24, 1945 Sex: Male         Room/Bed: 6O29U/7M54Y-50 Payor Info: Payor: [employer] MEDICARE / Plan: [place] HMO / Product Type: *No Product type* /    Admit Date/Time:  01/03/2021  4:40 PM  Primary Diagnosis:  Cervical disc disease with myelopathy  Hospital Problems: Principal Problem:   Cervical disc disease with myelopathy Active Problems:   Hypoxia   Supplemental oxygen dependent   Hypoalbuminemia due to protein-calorie malnutrition (HCC)   Hypokalemia   Macrocytic anemia   Prediabetes    Expected Discharge Date: Expected Discharge Date: 01/25/21  Team Members Present: Physician leading conference: Dr. Courtney Heys Social Worker Present: Ovidio Kin, LCSW Nurse Present: Dorthula Nettles, RN PT Present: Ailene Rud, PT OT Present: Lillia Corporal, OT PPS Coordinator present : Gunnar Fusi, SLP     Current Status/Progress Goal Weekly Team Focus  Bowel/Bladder   continent of bladder and continent to incontinent of bowel  remain continent of bowel/bladder  assess Q shift/PRN   Swallow/Nutrition/ Hydration             ADL's   stand pivot/short distance ambulation for self care CGA with RW, LB bathing Mod/Max, LB dress Max, toileting Max, UB dress Max  Mod I, lives alone (may need to downgrade)  ADL retraining, AE for ADLs, standing balance, RUE NMR/ROM/functional use   Mobility   CGA-min STS, gait up to 133 ft with RW, limited by R shoulder dislocation, gait improvement with shoe lift  mod I overall  LE stength, gait mechanics, endurance   Communication             Safety/Cognition/ Behavioral Observations            Pain   no complain of pain  will remain pain free  assess pain Q shift and PRN   Skin   surgical incision R neck; abrasion bilateral knees/feet  healing of wounds  without complications  assess Q shift/PRN     Discharge Planning:  Making good progress, plan still home alone with intermittent assist from son's who work and have families.   Team Discussion: Right shoulder anterior dislocation, WBAT. CT and MRI ordered. Will eventually need surgery. Continent B/B, LBM 01/14/21. Trazodone for sleep. Laceration to right buttock, laceration to left knee with Aquacel, laceration to right knee with Aquacel. Nursing educating on medications, skin/wound care. Barriers are weight bearing status and unreported pain. Discharging home alone. Sons will intermittently check in on him. Not making progress with ADL's. Built up shoe helping with balance. Max assist with bathing and dressing, almost mod assist with hygiene. Not making much progress due to fine motor coordination. Will need to problem solve toileting hygiene. Doing well with ambulation but depends on day. Walking 130 ft with RW. Mod I goals for household ambulation only. Does not report pain, but actions appear that he is in pain. Patient on target to meet rehab goals: Making progress with PT goals but not OT goals.  *See Care Plan and progress notes for long and short-term goals.   Revisions to Treatment Plan:  Not at this time.  Teaching Needs: Family education, medication management, pain management, skin/wound care, transfer training, gait training, balance training, endurance training, safety awareness.  Current Barriers to Discharge: Decreased caregiver support, Medical stability, Home enviroment access/layout,  Wound care, Lack of/limited family support, Weight, Weight bearing restrictions, Medication compliance, and Pending surgery  Possible Resolutions to Barriers: Continue to assess need for pain management, continue to problem solve for toilet hygiene, face washing, continue collar, continue skin/wound care, provide emotional support.     Medical Summary Current Status: R shoulder anterior  dislocation; denies pain usually; is chronic; tylenol for pain prn; laceration R buttocks, L knee and R knee/Aquacel; LBM yesterday;  Barriers to Discharge: Decreased family/caregiver support;Home enviroment access/layout;Weight;Weight bearing restrictions;Wound care;Medical stability  Barriers to Discharge Comments: home alone- sons intermittent help Possible Resolutions to [employer] Focus: ADLs- not a lot of progress- max A- ADLs- no significant progress; cannot reach face to wash/collar gets in way; numbness in fingers;won't make mod I goals- limited by shoulder pain- needs reverse shoulder arthroplasty on RUE; d/c 10/7   Continued Need for Acute Rehabilitation Level of Care: The patient requires daily medical management by a physician with specialized training in physical medicine and rehabilitation for the following reasons: Direction of a multidisciplinary physical rehabilitation program to maximize functional independence : Yes Medical management of patient stability for increased activity during participation in an intensive rehabilitation regime.: Yes Analysis of laboratory values and/or radiology reports with any subsequent need for medication adjustment and/or medical intervention. : Yes   I attest that I was present, lead the team conference, and concur with the assessment and plan of the team.   Cristi Loron 01/15/2021, 5:55 PM

## 2021-01-15 NOTE — Progress Notes (Signed)
PHYSICAL MEDICINE & REHABILITATION PROGRESS NOTE  Subjective/Complaints:  Pt reports significant R shoulder pain- wasn't clear when it started, chronic?- cannot flex or abduct R shoulder- almost all pain is anterior and maybe a little posterior in upper traps.    ROS:  Pt denies SOB, abd pain, CP, N/V/C/D, and vision changes  Objective: Vital Signs: Blood pressure (!) 115/56, pulse 69, temperature 97.6 F (36.4 C), temperature source Oral, resp. rate 20, height 5\' 7"  (1.702 m), weight 83.2 kg, SpO2 94 %. DG Shoulder Right  Result Date: 01/14/2021 CLINICAL DATA:  Right shoulder deformity EXAM: RIGHT SHOULDER - 2+ VIEW COMPARISON:  None. FINDINGS: Evaluation of the right shoulder is slightly limited by suboptimal positioning. There is, however, anterosuperior dislocation of the humeral head in relation of the glenoid fossa. No definite fracture. Advanced degenerative arthritis of the acromioclavicular joint is noted. Limited evaluation of the right hemithorax is unremarkable. IMPRESSION: Anterosuperior dislocation of the right humeral head. Correlation with peripheral pulse examination of the right upper extremity is recommended. If abnormal, CT arteriography may be helpful to assess the axillobrachial vasculature. Electronically Signed   By: Fidela Salisbury M.D.   On: 01/14/2021 22:35   Recent Labs    01/14/21 0701  WBC 7.7  HGB 12.8*  HCT 39.5  PLT 343   Recent Labs    01/14/21 0701  NA 141  K 4.2  CL 105  CO2 29  GLUCOSE 132*  BUN 27*  CREATININE 0.88  CALCIUM 9.8    Intake/Output Summary (Last 24 hours) at 01/15/2021 5732 Last data filed at 01/15/2021 0700 Gross per 24 hour  Intake 960 ml  Output 1225 ml  Net -265 ml        Physical Exam: BP (!) 115/56 (BP Location: Left Arm)   Pulse 69   Temp 97.6 F (36.4 C) (Oral)   Resp 20   Ht 5\' 7"  (1.702 m)   Wt 83.2 kg   SpO2 94%   BMI 28.73 kg/m    General: awake, alert, appropriate, sitting up in w/c  at bedside; cannot lift RUE against gravity;  NAD HENT: conjugate gaze; oropharynx moist CV: regular rate- irregular/irregular; ; no JVD Pulmonary: CTA B/L; no W/R/R- good air movement GI: soft, NT, ND, (+)BS Psychiatric: appropriate Neurological: alert Neuro: Alert Sensation intact to LT in BUE Fine motor able to oppose finger to thumb in BUEs Unable to lift RUE against gravity- has what appears to be R shoulder anteriorly dislocated on exam Motor: Bilateral upper extremities: Shoulder abduction 2/5, elbow flexion3+ /extension 2+/5, handgrip 4/5, right weaker than left, stable Bilateral lower extremities: 4-/5 proximal distal   Assessment/Plan: 1. Functional deficits which require 3+ hours per day of interdisciplinary therapy in a comprehensive inpatient rehab setting. Physiatrist is providing close team supervision and 24 hour management of active medical problems listed below. Physiatrist and rehab team continue to assess barriers to discharge/monitor patient progress toward functional and medical goals   Care Tool:  Bathing    Body parts bathed by patient: Right arm, Left arm, Chest, Abdomen, Front perineal area, Right upper leg, Left upper leg   Body parts bathed by helper: Face, Buttocks, Left lower leg, Right lower leg     Bathing assist Assist Level: Moderate Assistance - Patient 50 - 74%     Upper Body Dressing/Undressing Upper body dressing   What is the patient wearing?: Button up shirt    Upper body assist Assist Level: Maximal Assistance - Patient 25 -  49%    Lower Body Dressing/Undressing Lower body dressing      What is the patient wearing?: Pants, Incontinence brief     Lower body assist Assist for lower body dressing: Maximal Assistance - Patient 25 - 49%     Toileting Toileting    Toileting assist Assist for toileting: Total Assistance - Patient < 25%     Transfers Chair/bed transfer  Transfers assist     Chair/bed transfer assist level:  Contact Guard/Touching assist Chair/bed transfer assistive device: Programmer, multimedia   Ambulation assist      Assist level: Contact Guard/Touching assist Assistive device: Walker-rolling Max distance: 133 ft   Walk 10 feet activity   Assist     Assist level: Contact Guard/Touching assist Assistive device: Walker-rolling   Walk 50 feet activity   Assist Walk 50 feet with 2 turns activity did not occur: Safety/medical concerns  Assist level: Contact Guard/Touching assist Assistive device: Walker-rolling    Walk 150 feet activity   Assist Walk 150 feet activity did not occur: Safety/medical concerns         Walk 10 feet on uneven surface  activity   Assist Walk 10 feet on uneven surfaces activity did not occur: Safety/medical concerns         Wheelchair     Assist Is the patient using a wheelchair?: Yes Type of Wheelchair: Manual    Wheelchair assist level: Supervision/Verbal cueing Max wheelchair distance: 136 ft    Wheelchair 50 feet with 2 turns activity    Assist        Assist Level: Supervision/Verbal cueing   Wheelchair 150 feet activity     Assist  Wheelchair 150 feet activity did not occur: Safety/medical concerns   Assist Level: Total Assistance - Patient < 25%    Medical Problem List and Plan: 1.  Cervical myelopathy s/p C3-4 ACDF on 01/01/21  Con't PT and OT- CIR 2.  Impaired mobility:  -DVT/anticoagulation:  Pharmaceutical: Heparin--now d/ced Was on Eliquis PTA--held due to surgery--> resumed on 9/25             -antiplatelet therapy: N/A 3. Endstate OA left hip/Pain Management: Oxycodone prn             Continue voltaren gel to left hip QID             Xray showing left hip OA  Controlled with meds on 9/22 Monitor with increased exertion 4. Mood: LCSW to follow for evaluation and support.              -antipsychotic agents: N/A 5. Neuropsych: This patient is capable of making decisions on his  own behalf. 6. Bilateral knee abraisons/cervical insicion:  Routine pressure relief measures--monitor incision for healing.              --D/ced bacitracin ointement Continue Aquacell with dry dressing to bilateral knee abrasions.  7. Fluids/Electrolytes/Nutrition: Monitor I/Os.  8. CAF: Monitor HR TID--continue to hold eliquis till 9/23             --On coreg BID.   9/27- change to daily weights Filed Weights   01/03/21 1653  Weight: 83.2 kg  9.  Dehydration: Encourage fluid intake. Monitor orthostatic symptoms 10. Hypoxia/ Productive cough: Will down grade diet to dysphagia 3 with aspiration precautions/supervison             --has been cleared to remove collar for meals.              --  CXR showing atelectasis  Wean off supplemental oxygen 11.  Prediabetes diet controlled?: Has been off meds for 5 years.  Hemoglobin A1c 5.7 on 9/16 Relatively controlled on 9/21 Monitor with increased mobility 12. HTN: Monitor BP TID--continue Lasix and Cozaar. BP currently soft but as expected for SCI Vitals:   01/14/21 1950 01/15/21 0549  BP: 125/72 (!) 115/56  Pulse: 70 69  Resp: 18 20  Temp: 98.2 F (36.8 C) 97.6 F (36.4 C)  SpO2: 97% 94%  Controlled monitor for orthostatic changes  13.  Macrocytic anemia:   Folate within normal limits on 9/16 Hemoglobin 11.5 on 9/19 14. OSA: Does not use CPAP. 15. Constipation: Has not had BM for a couple of days             Added Senna S.   MiraLAX started on 9/16 16. Overweight BMI 28.82: provide dietary education 17.  Hypokalemia  Potassium 3.9 on 9/19, plan to repeat labs on Friday  Supplemented increased on 9/16  18.  Hypoalbuminemia  Supplement initiated on 9/16 19.  Right> Left shoulder abd limitation with ant subluxation deformity on RIght side which appears chronic . Pt denies hx of shoulder surgery.  No pain with ROM, no xrays in Epic will order shoulder 2 V RIght   9/27- called Ortho about shoulder dislocation on R- will get CT and MRI-  needs reverse shoulder surgery done eventually- can WBAT and no limitation on fROM per Ortho   I spent a total of 36 minutes on total care- working with Ortho and PA about pt care.  ?50% on coordination of care.   LOS: 12 days A FACE TO FACE EVALUATION WAS PERFORMED  Eathen Budreau 01/15/2021, 9:17 AM

## 2021-01-16 LAB — GLUCOSE, CAPILLARY
Glucose-Capillary: 107 mg/dL — ABNORMAL HIGH (ref 70–99)
Glucose-Capillary: 169 mg/dL — ABNORMAL HIGH (ref 70–99)
Glucose-Capillary: 93 mg/dL (ref 70–99)
Glucose-Capillary: 99 mg/dL (ref 70–99)

## 2021-01-16 MED ORDER — SENNOSIDES-DOCUSATE SODIUM 8.6-50 MG PO TABS: 1.0000 | ORAL_TABLET | Freq: Every day | ORAL | Status: DC

## 2021-01-16 NOTE — Progress Notes (Signed)
Physical Therapy Session Note  Patient Details  Name: Logan [name] MRN: [id number] Date of Birth: 01-11-1946  Today's Date: 01/16/2021 PT Individual Time: 0845-1000 PT Individual Time Calculation (min): 75 min   Short Term Goals: Week 2:  PT Short Term Goal 1 (Week 2): =LTGs d/t ELOS  Skilled Therapeutic Interventions/Progress Updates:    Pt received seated in w/c in room, agreeable to PT session. No complaints of pain this AM. Pt reports urge to have a BM. Per NT pt frequently incontinent of BM upon standing, recommending transfer to [facility] vs ambulation into bathroom. Sit to stand with min A to RW, CGA for stand pivot transfer to [facility] with RW. Pt is continent of bowel and urine while seated on BSC, see Flowsheet for details. Pt is max A for pericare and clothing management. Assisted pt with donning shoes. Nustep level 4 x 10 min with use of BLE and LUE for global endurance training. Pt reports some L hip pain with Nustep that improves at rest. Ambulation x 125 ft with RW and CGA for balance, antalgic gait pattern and decreased gait speed but good balance noted overall. Standing alt L/R 3" step-ups with B handrails and min A for balance, 2 x 10 reps to fatigue. Pt left seated in w/c in room with needs in reach, quick release belt and chair alarm in place.  Therapy Documentation Precautions:  Precautions Precautions: Fall, Cervical Required Braces or Orthoses: Cervical Brace Cervical Brace: Hard collar, At all times, Other (comment) Restrictions Weight Bearing Restrictions: No    Therapy/Group: Individual Therapy   Excell Seltzer, PT, DPT, CSRS  01/16/2021, 12:15 PM

## 2021-01-16 NOTE — Progress Notes (Signed)
Occupational Therapy Session Note  Patient Details  Name: Logan [name] MRN: [id number] Date of Birth: 1945/10/21  Today's Date: 01/16/2021 OT Individual Time: 1030-1055 and 1400-1510 OT Individual Time Calculation (min): 25 min and 70 min   Short Term Goals: Week 1:  OT Short Term Goal 1 (Week 1): Pt will complete sit<>stand transfers using LRAD with CGA in preperation for dressing and toileting OT Short Term Goal 1 - Progress (Week 1): Met OT Short Term Goal 2 (Week 1): Pt will complete UB dressing with min assist OT Short Term Goal 2 - Progress (Week 1): Not met OT Short Term Goal 3 (Week 1): Pt will increased right shoulder scaption to 50 degrees for functional reaching tasks. OT Short Term Goal 3 - Progress (Week 1): Progressing toward goal OT Short Term Goal 4 (Week 1): Pt will donn shorts using reacher with min assist. OT Short Term Goal 4 - Progress (Week 1): Progressing toward goal Week 2:  OT Short Term Goal 1 (Week 2): Pt will adhere to cervical precautions consistently during ADL w/ no more than Min cues OT Short Term Goal 2 (Week 2): Pt will increase Right shoulder scaption to 50* to improve functional skill performance OT Short Term Goal 3 (Week 2): Pt will perform BSC/toilet transfers with LRAD and Supervision OT Short Term Goal 4 (Week 2): Pt will perform LB dress with AE PRN with Min A   Skilled Therapeutic Interventions/Progress Updates:    Session 1: Pt greeted at time of session sitting up in wheelchair agreeable to OT session, no pain reported during session. Transported to gym and worked on standing balance while using LUE to reach out to side and partially behind self to simulate reaching behind self for toileting and ADL tasks for approx 20 bean bags. Second round of pt bringing bean bag behind self with LUE and handing to therapist behind his back for translation to again improve functional use of LUE. Back in room, discussion with pt regarding potential bidet  installation at home and print out provided for pricing and where to purchase. Pt receptive. Up in chair alarm on call bell in reach.   Session 2: Pt greeted at time of session sitting up in wheelchair with son in room, did not stay for session but did review with son CLOF for bathing, dressing, and toileting and nodded in understanding. Son open to coming in next week for family ed, prefers M-Wed morning or afternoon. Pt ambulating chair > bathroom CGA with RW and transferred to toilet same manner. Pt had been incontinent of bowels prior to transfer, needing Mod A overall for toileting but able to help with clothing management to doff. After toileting, stand pivot toilet > TTB in bathroom CGA and spun to face shower wall. UB/LB bathing with Mod overall, assist washing face, buttocks, and feet. Plan to provide LHS for feet. Pt wearing [place]en hard collar throughout shower as therapist unclear if able to take off, surgical site healed and no open places so did not cover. Dried off sitting edge of bench prior to drying off buttocks in standing and donning shorts Min/Mod A overall as pt able to thread over feet from loose fit and assist to fully get up in the back. Changed pads for C collar with education on how to do so prior to shirt with Max A. Deodorant and grooming tasks Max A. Up in wheelchair alarm on call bell in reach.   Therapy Documentation Precautions:  Precautions Precautions: Fall, Cervical Required  Braces or Orthoses: Cervical Brace Cervical Brace: Hard collar, At all times, Other (comment) Restrictions Weight Bearing Restrictions: No    Therapy/Group: Individual Therapy  Viona Gilmore 01/16/2021, 7:20 AM

## 2021-01-16 NOTE — Progress Notes (Signed)
[place] PHYSICAL MEDICINE & REHABILITATION PROGRESS NOTE  Subjective/Complaints:  Pt reports bowels "going too much"- Eating well.  Occ cough.    ROS:   Pt denies SOB, abd pain, CP, N/V/C/D, and vision changes   Objective: Vital Signs: Blood pressure 139/67, pulse 73, temperature 97.7 F (36.5 C), temperature source Oral, resp. rate 16, height 5\' 7"  (1.702 m), weight 80.4 kg, SpO2 95 %. DG Shoulder Right  Result Date: 01/14/2021 CLINICAL DATA:  Right shoulder deformity EXAM: RIGHT SHOULDER - 2+ VIEW COMPARISON:  None. FINDINGS: Evaluation of the right shoulder is slightly limited by suboptimal positioning. There is, however, anterosuperior dislocation of the humeral head in relation of the glenoid fossa. No definite fracture. Advanced degenerative arthritis of the acromioclavicular joint is noted. Limited evaluation of the right hemithorax is unremarkable. IMPRESSION: Anterosuperior dislocation of the right humeral head. Correlation with peripheral pulse examination of the right upper extremity is recommended. If abnormal, CT arteriography may be helpful to assess the axillobrachial vasculature. Electronically Signed   By: Fidela Salisbury M.D.   On: 01/14/2021 22:35   CT SHOULDER RIGHT WO CONTRAST  Result Date: 01/15/2021 CLINICAL DATA:  Surgical planning. EXAM: CT OF THE UPPER RIGHT EXTREMITY WITHOUT CONTRAST TECHNIQUE: Multidetector CT imaging of the upper right extremity was performed according to the standard protocol. COMPARISON:  Radiographs and MRI. FINDINGS: Moderate glenohumeral joint degenerative changes. Moderate superior and lateral subluxation of the humeral head in relation to the glenoid fossa likely due to the large full-thickness retracted rotator cuff tear. Marked narrowing of the humeroacromial space with the humeral head riding under the acromion. No acute fractures are identified. No AVN. The [facility] joint is intact. The acromion is type 2 in shape. No significant lateral  downsloping or subacromial spurring. Large full-thickness retracted rotator cuff tear as demonstrated on the MRI. There is also associated chronic fatty atrophy of the rotator cuff muscles. The simple lipoma is noted in the proximal supraspinatus muscle. Large joint effusion and large amount of fluid in the subacromial/subdeltoid bursa. The visualized right ribs are intact and the visualized right lung is grossly clear. No worrisome pulmonary lesions. IMPRESSION: 1. Large full-thickness retracted rotator cuff tear as demonstrated on the MRI. Associated severe chronic fatty atrophy of the rotator cuff muscles. 2. Superior and lateral subluxation of the humeral head in relation to the glenoid fossa. 3. Moderate glenohumeral joint degenerative changes. 4. Large joint effusion and large amount of fluid in the subacromial/subdeltoid bursa. Electronically Signed   By: Marijo Sanes M.D.   On: 01/15/2021 16:58   MR SHOULDER RIGHT WO CONTRAST  Result Date: 01/15/2021 CLINICAL DATA:  Right shoulder pain. EXAM: MRI OF THE RIGHT SHOULDER WITHOUT CONTRAST TECHNIQUE: Multiplanar, multisequence MR imaging of the shoulder was performed. No intravenous contrast was administered. COMPARISON:  Radiographs 01/14/2021 FINDINGS: Examination is quite limited due to significant motion artifact. Rotator cuff: Large full-thickness retracted rotator cuff tear. The supraspinatus, infraspinatus and subscapularis tendons are completely torn and retracted. The subscapularis tendon is torn and flipped into the glenohumeral joint. Associated marked narrowing of the humeroacromial space with superior and anterior subluxation of the humeral head in relation to the glenoid fossa. Muscles:  Significant fatty atrophy of the rotator cuff muscles. Biceps long head: Not identified. Likely chronically torn and retracted. Acromioclavicular Joint: Moderate degenerative changes. Type 2 acromion. No significant lateral downsloping. Mild subacromial  spurring. Glenohumeral Joint: Moderate degenerative changes. Large joint effusion and moderate synovitis. Labrum:  Degenerated and torn. Bones: Bone contusions involving the  humeral head but no definite fracture. No glenoid fractures are identified. Bone contusion involving the coracoid process without definite fracture. Other: Large amount of fluid in the subacromial/subdeltoid bursa due to the large rotator cuff tear. IMPRESSION: 1. Large full-thickness retracted rotator cuff tear as described above. See associated chronic fatty atrophy of the rotator cuff muscles. 2. Superior and lateral subluxation of the humeral head. 3. Likely chronically torn and retracted long head biceps tendon. 4. Bone contusions involving the humeral head and coracoid process without definite fracture. 5. Large joint effusion and moderate synovitis. Electronically Signed   By: Marijo Sanes M.D.   On: 01/15/2021 15:18   Recent Labs    01/14/21 0701  WBC 7.7  HGB 12.8*  HCT 39.5  PLT 343   Recent Labs    01/14/21 0701  NA 141  K 4.2  CL 105  CO2 29  GLUCOSE 132*  BUN 27*  CREATININE 0.88  CALCIUM 9.8    Intake/Output Summary (Last 24 hours) at 01/16/2021 0827 Last data filed at 01/16/2021 0700 Gross per 24 hour  Intake 1140 ml  Output 275 ml  Net 865 ml        Physical Exam: BP 139/67 (BP Location: Right Arm)   Pulse 73   Temp 97.7 F (36.5 C) (Oral)   Resp 16   Ht 5\' 7"  (1.702 m)   Wt 80.4 kg   SpO2 95%   BMI 27.76 kg/m     General: awake, alert, appropriate, sitting up in w/c at bedside; NAD HENT: conjugate gaze; oropharynx moist CV: regular rate; no JVD Pulmonary: little coarse; a little productive cough, but sounds CTA B/L after cough;  GI: soft, NT, ND, (+)BS Psychiatric: appropriate Neurological: Ox3 Neuro: Alert Sensation intact to LT in BUE Fine motor able to oppose finger to thumb in BUEs Unable to lift RUE against gravity- has what appears to be R shoulder anteriorly  dislocated on exam- is chronic- Motor: Bilateral upper extremities: Shoulder abduction 2/5, elbow flexion3+ /extension 2+/5, handgrip 4/5, right weaker than left, stable Bilateral lower extremities: 4-/5 proximal distal   Assessment/Plan: 1. Functional deficits which require 3+ hours per day of interdisciplinary therapy in a comprehensive inpatient rehab setting. Physiatrist is providing close team supervision and 24 hour management of active medical problems listed below. Physiatrist and rehab team continue to assess barriers to discharge/monitor patient progress toward functional and medical goals   Care Tool:  Bathing    Body parts bathed by patient: Right arm, Left arm, Chest, Abdomen, Front perineal area, Right upper leg, Left upper leg   Body parts bathed by helper: Face, Buttocks, Left lower leg, Right lower leg     Bathing assist Assist Level: Moderate Assistance - Patient 50 - 74%     Upper Body Dressing/Undressing Upper body dressing   What is the patient wearing?: Button up shirt    Upper body assist Assist Level: Maximal Assistance - Patient 25 - 49%    Lower Body Dressing/Undressing Lower body dressing      What is the patient wearing?: Pants, Incontinence brief     Lower body assist Assist for lower body dressing: Maximal Assistance - Patient 25 - 49%     Toileting Toileting    Toileting assist Assist for toileting: Moderate Assistance - Patient 50 - 74%     Transfers Chair/bed transfer  Transfers assist     Chair/bed transfer assist level: Contact Guard/Touching assist Chair/bed transfer assistive device: Programmer, multimedia  Ambulation assist      Assist level: Contact Guard/Touching assist Assistive device: Walker-rolling Max distance: 133 ft   Walk 10 feet activity   Assist     Assist level: Contact Guard/Touching assist Assistive device: Walker-rolling   Walk 50 feet activity   Assist Walk 50 feet with 2  turns activity did not occur: Safety/medical concerns  Assist level: Contact Guard/Touching assist Assistive device: Walker-rolling    Walk 150 feet activity   Assist Walk 150 feet activity did not occur: Safety/medical concerns         Walk 10 feet on uneven surface  activity   Assist Walk 10 feet on uneven surfaces activity did not occur: Safety/medical concerns         Wheelchair     Assist Is the patient using a wheelchair?: Yes Type of Wheelchair: Manual    Wheelchair assist level: Supervision/Verbal cueing Max wheelchair distance: 136 ft    Wheelchair 50 feet with 2 turns activity    Assist        Assist Level: Supervision/Verbal cueing   Wheelchair 150 feet activity     Assist  Wheelchair 150 feet activity did not occur: Safety/medical concerns   Assist Level: Total Assistance - Patient < 25%    Medical Problem List and Plan: 1.  Cervical myelopathy s/p C3-4 ACDF on 01/01/21  Con't CIR and PT/OT with no limitations of RUE.  2.  Impaired mobility:  -DVT/anticoagulation:  Pharmaceutical: Heparin--now d/ced Was on Eliquis PTA--held due to surgery--> resumed on 9/25             -antiplatelet therapy: N/A 3. Endstate OA left hip/Pain Management: Oxycodone prn             Continue voltaren gel to left hip QID             Xray showing left hip OA  9/28- pt needs R shoulder reverse shoulder arthroplasty- MRI and CT done- con't pain regimen for now.  Monitor with increased exertion 4. Mood: LCSW to follow for evaluation and support.              -antipsychotic agents: N/A 5. Neuropsych: This patient is capable of making decisions on his own behalf. 6. Bilateral knee abraisons/cervical insicion:  Routine pressure relief measures--monitor incision for healing.              --D/ced bacitracin ointement Continue Aquacell with dry dressing to bilateral knee abrasions.  7. Fluids/Electrolytes/Nutrition: Monitor I/Os.  8. CAF: Monitor HR  TID--continue to hold eliquis till 9/23             --On coreg BID.   9/27- change to daily weights  9/28- weight stable to lower- con't regimen Filed Weights   01/03/21 1653 01/16/21 0539  Weight: 83.2 kg 80.4 kg  9.  Dehydration: Encourage fluid intake. Monitor orthostatic symptoms 10. Hypoxia/ Productive cough: Will down grade diet to dysphagia 3 with aspiration precautions/supervison             --has been cleared to remove collar for meals.              --CXR showing atelectasis  Wean off supplemental oxygen 11.  Prediabetes diet controlled?: Has been off meds for 5 years.  Hemoglobin A1c 5.7 on 9/16 Relatively controlled on 9/21 Monitor with increased mobility 12. HTN: Monitor BP TID--continue Lasix and Cozaar. BP currently soft but as expected for SCI Vitals:   01/16/21 0545 01/16/21 0547  BP: 126/63 139/67  Pulse: 73 73  Resp: 16   Temp: 97.7 F (36.5 C)   SpO2: 95%   Controlled monitor for orthostatic changes  13.  Macrocytic anemia:   Folate within normal limits on 9/16 Hemoglobin 11.5 on 9/19 14. OSA: Does not use CPAP. 15. Constipation: Has not had BM for a couple of days             Added Senna S.   MiraLAX started on 9/16 16. Overweight BMI 28.82: provide dietary education 17.  Hypokalemia  Potassium 3.9 on 9/19, plan to repeat labs on Friday  Supplemented increased on 9/16  18.  Hypoalbuminemia  Supplement initiated on 9/16 19.  Right> Left shoulder abd limitation with ant subluxation deformity on RIght side which appears chronic . Pt denies hx of shoulder surgery.  No pain with ROM, no xrays in Epic will order shoulder 2 V RIght   9/27- called Ortho about shoulder dislocation on R- will get CT and MRI- needs reverse shoulder surgery done eventually- can WBAT and no limitation on fROM per Ortho  9/28- MRI and CT done for surgical planning- torn and retracted biceps tendon; retracted rotator cuff tear.  20. Cough  9/28- asked pt to use ICS q1 hour while  awake.      LOS: 13 days A FACE TO FACE EVALUATION WAS PERFORMED  Annistyn Depass 01/16/2021, 8:27 AM

## 2021-01-16 NOTE — Progress Notes (Signed)
Patient ID: Logan [name], male   DOB: 03/31/1946, 75 y.o.   MRN: [id number]  Met with pt to discuss team conference update and progress this week. Pt is concerned about how much he is going to the bathroom and feels he is on too many bowel meds. He has spoken with MD regarding this. He is better today and is learning how to clean himself after tolieting and this will be needed due to he will be alone at home upon discharge. Aware target discharge is 10/7, will continue to work on discharge needs. 

## 2021-01-17 LAB — GLUCOSE, CAPILLARY
Glucose-Capillary: 104 mg/dL — ABNORMAL HIGH (ref 70–99)
Glucose-Capillary: 133 mg/dL — ABNORMAL HIGH (ref 70–99)
Glucose-Capillary: 92 mg/dL (ref 70–99)
Glucose-Capillary: 136 mg/dL — ABNORMAL HIGH (ref 70–99)

## 2021-01-17 MED ORDER — BISACODYL 10 MG RE SUPP
10.0000 mg | Freq: Every day | RECTAL | Status: DC
  Administered 2021-01-17 – 2021-01-29 (×8): 10 mg via RECTAL
  Filled 2021-01-17 (×10): qty 1

## 2021-01-17 MED ORDER — SENNOSIDES-DOCUSATE SODIUM 8.6-50 MG PO TABS: 1.0000 | ORAL_TABLET | Freq: Every evening | ORAL | Status: DC | PRN

## 2021-01-17 NOTE — Progress Notes (Signed)
Occupational Therapy Session Note  Patient Details  Name: Logan [name] MRN: [id number] Date of Birth: [birth year]  Today's Date: 01/18/2021 OT Individual Time: [phone] and 1035-1100 OT Individual Time Calculation (min): 27 min and 25 min 35 minutes missed  Skilled Therapeutic Interventions/Progress Updates:    Pt greeted in bed, reporting not feeling well at all due to persistent diarrhea yesterday/last night. Feeling dehydrated. Agreeable to pt education using handout. When OT arrived with handout, pt reported that he had to use the urinal and wanted to use it in standing. Mod A for supine<sit with HOB elevated, pt earnestly trying his best to do this independently. CGA for sit<stand with RW where pt lowered clothing and held the urinal, no UE support on device at this time, CGA for balance. He needed Min A to elevate clothing again before returning to EOB. Handwashing performed with personal sanitizer and then pt asked to don his shoes. Used the reacher with supervision to doff gripper socks, did not have LH shoe horn in the room so Max A for donning footwear. Reeducated pt that he will need a shoe horn at home so that he can don shoes himself. Pt assisted with transferring to the w/c due to patients in 5 Central Unit moving downstairs, Min A for stand pivot<w/c using RW, pt with B LE shakiness/unsteadiness during transfer. Due to time, opted to save pt education for next session together. Pt remained in the w/c with all needs within reach and safety belt fastened.   2nd Session 1:1 tx (25 min) Pt greeted in the w/c, still not feeling well, hooked up to IV, reporting "I don't know what I can do." We went over his energy conservation/work simplification handout together, discussing functional implications regarding using strategies during ADLs/IADLs at home. Reviewed his AE handout as well which was printed by primary therapist, emphasizing his need to independently purchase printed AE in order to  maximize his independence at time of d/c. Pt limited with conversation, verbalized understanding with education, wishing to end session early due to malaise. He remained sitting up in the w/c at close of session. Tx time missed.   Therapy Documentation Precautions:  Precautions Precautions: Fall, Cervical Required Braces or Orthoses: Cervical Brace Cervical Brace: Hard collar, At all times, Other (comment) Restrictions Weight Bearing Restrictions: No Vital Signs: Therapy Vitals Temp: 98 F (36.7 C) Temp Source: Oral Pulse Rate: 73 Resp: 18 BP: (!) 97/55 Patient Position (if appropriate): Sitting Oxygen Therapy SpO2: 99 % O2 Device: Room Air Pain: in his Rt hip during 1st session, Notified RN of pts request for topical analgesic cream Pain Assessment Pain Scale: 0-10 Pain Score: 5  Pain Location: Shoulder Pain Orientation: Right Patients Stated Pain Goal: 3 Pain Intervention(s): Medication (See eMAR) ADL: ADL Grooming: Dependent Where Assessed-Grooming: Sitting at sink Upper Body Bathing: Minimal assistance Where Assessed-Upper Body Bathing: Sitting at sink Lower Body Bathing: Maximal assistance Where Assessed-Lower Body Bathing: Sitting at sink, Standing at sink Upper Body Dressing: Maximal assistance Where Assessed-Upper Body Dressing: Sitting at sink Lower Body Dressing: Maximal assistance Where Assessed-Lower Body Dressing: Sitting at sink, Standing at sink     Therapy/Group: Individual Therapy  Rachael Ferrie A Seraphine Gudiel 01/18/2021, 12:52 PM

## 2021-01-17 NOTE — Progress Notes (Signed)
Patient ID: Logan [name], male   DOB: 12-30-1945, 75 y.o.   MRN: [id number]  [place] with [name]-pt's son to schedule family education next week for Thursday 10/6 from 9-12. He will be here and assist pt he works Fri, Sat and Sun.

## 2021-01-17 NOTE — Progress Notes (Signed)
Physical Therapy Session Note  Patient Details  Name: Logan [name] MRN: [id number] Date of Birth: [birth year]  Today's Date: 01/17/2021 PT Individual Time: 1031-1100 PT Individual Time Calculation (min): 29 min   Short Term Goals: Week 2:  PT Short Term Goal 1 (Week 2): =LTGs d/t ELOS   Skilled Therapeutic Interventions/Progress Updates:  Patient seated in w/c on hand off from previous PT session. Patient alert and agreeable to continued PT session.   Patient with mild pain complaint at start of session re: pain at L hip from "sleeping on it wrong" despite hx of OA in hip.  Therapeutic Activity: Transfers: Patient performed sit<>stand and stand pivot transfers throughout session with CGA/ Min A. Provided vc for increasing forward lean to unweight seat from w/c.  Guided in standing toe touches to 5" step using RW and completing 1x15 each LE. Attempt to progress to step up but experiences increased pain in L hip  requires Min A to complete and decreased strength in RLE requires Mod A to complete. 1 rep completed each LE.   Gait Training:  Patient ambulated 35 ft using RW with CGA. Demonstrated decreased step height bilaterally with slow pace overall. Provided vc/ tc for increasing step height/ length. Guided in ambulation over 1 inch obstacles requiring lift of RW to maneuver. Pt able to clear obstacles with RW and clears each step-over with adequate height but decreased step length.   Patient seated  in w/c at end of session with brakes locked, belt alarm set, and all needs within reach.     Therapy Documentation Precautions:  Precautions Precautions: Fall, Cervical Required Braces or Orthoses: Cervical Brace Cervical Brace: Hard collar, At all times, Other (comment) Restrictions Weight Bearing Restrictions: No  Pain:  Mild pain related in L hip with no numerical rating provided.   Therapy/Group: Individual Therapy  Alger Simons PT, DPT 01/17/2021, 3:56 PM

## 2021-01-17 NOTE — Progress Notes (Signed)
Occupational Therapy Session Note  Patient Details  Name: Logan [name] MRN: [id number] Date of Birth: 1946-03-16  Today's Date: 01/17/2021 OT Individual Time: [phone] and 1500-1555 OT Individual Time Calculation (min): 58 min and 55 min   Short Term Goals: Week 1:  OT Short Term Goal 1 (Week 1): Pt will complete sit<>stand transfers using LRAD with CGA in preperation for dressing and toileting OT Short Term Goal 1 - Progress (Week 1): Met OT Short Term Goal 2 (Week 1): Pt will complete UB dressing with min assist OT Short Term Goal 2 - Progress (Week 1): Not met OT Short Term Goal 3 (Week 1): Pt will increased right shoulder scaption to 50 degrees for functional reaching tasks. OT Short Term Goal 3 - Progress (Week 1): Progressing toward goal OT Short Term Goal 4 (Week 1): Pt will donn shorts using reacher with min assist. OT Short Term Goal 4 - Progress (Week 1): Progressing toward goal Week 2:  OT Short Term Goal 1 (Week 2): Pt will adhere to cervical precautions consistently during ADL w/ no more than Min cues OT Short Term Goal 2 (Week 2): Pt will increase Right shoulder scaption to 50* to improve functional skill performance OT Short Term Goal 3 (Week 2): Pt will perform BSC/toilet transfers with LRAD and Supervision OT Short Term Goal 4 (Week 2): Pt will perform LB dress with AE PRN with Min A   Skilled Therapeutic Interventions/Progress Updates:    Session 1: Pt greeted at time of session up in wheelchair ready for OT session, no pain reported. MD entered at this time for morning rounds, discussed with pt bowel program and OT reviewed with pt as well for max carryover and pt agreeable. States his son would help him with this if needed at home. Focus of session on ADL retraining with Mod/Max A for UB dressing with pt dressing RUE first, needing help to bring around back and thread LUE and eventually needing assist with buttons despite muliple trials. Later in session, OT making button  hook out of built up handle and paper clip with moderate success buttoning shirt for bottom buttons, still unable to button top buttons. Using reacher, LB dressing with Min/Mod A today which is an improvement. MD approved TEDS, and placed on pt as he does have edema in B feet. Continued conversation throughout for bidet at home and having assist from son. Up in chair alarm on call bell in reach.    Session 2: Pt greeted at time of session sleeping bed level, easily woken with verbal stimuli. Pt did have L hip pain during mobility which states has been chronic and staff aware. Rest breaks provided PRN. Supine > sit with HOB elevated on L side with Min A and extended time. Stand pivot > wheelchair Min A as pt fatigued and needed boost. Transported to day room and demonstrated use of true button hook and pt performing x5 buttons with extended time and cues to problem solve. Provided with hand out for where to purchase button hook and hip kit with several types of AE and pt in agreement that he needs these. Pt perseverating as well on applying for medicaid and recent denial. Focused remainder of session on IADL retraining in kitchen with pt gathering food items from kitchen from fridge and placing in walker bag for training to reduce fall risk and access items. Verbally reviewed other techniques for kitchen set up. Back in room, used urinal with assist in standing. Up in chair alarm  on call bell in reach.   Therapy Documentation Precautions:  Precautions Precautions: Fall, Cervical Required Braces or Orthoses: Cervical Brace Cervical Brace: Hard collar, At all times, Other (comment) Restrictions Weight Bearing Restrictions: No     Therapy/Group: Individual Therapy  Viona Gilmore 01/17/2021, 7:10 AM

## 2021-01-17 NOTE — Progress Notes (Signed)
[facility] PHYSICAL MEDICINE & REHABILITATION PROGRESS NOTE  Subjective/Complaints:  Pt reports used ICS all day yesterday- even more than q hour-  Didn't wake up overnight coughing- slept all night.  Having bowel accidents a lot- soft stool- almost every time has BM, is incontinent.   Also has a little LE edema- OT asking about TEDs.   Pt and I discussed bowel program with suppository nightly- he agreed.   ROS:   Pt denies SOB, abd pain, CP, N/V/C/D, and vision changes  Objective: Vital Signs: Blood pressure 122/72, pulse 72, temperature 97.6 F (36.4 C), temperature source Oral, resp. rate 16, height 5\' 7"  (1.702 m), weight 80.4 kg, SpO2 94 %. CT SHOULDER RIGHT WO CONTRAST  Result Date: 01/15/2021 CLINICAL DATA:  Surgical planning. EXAM: CT OF THE UPPER RIGHT EXTREMITY WITHOUT CONTRAST TECHNIQUE: Multidetector CT imaging of the upper right extremity was performed according to the standard protocol. COMPARISON:  Radiographs and MRI. FINDINGS: Moderate glenohumeral joint degenerative changes. Moderate superior and lateral subluxation of the humeral head in relation to the glenoid fossa likely due to the large full-thickness retracted rotator cuff tear. Marked narrowing of the humeroacromial space with the humeral head riding under the acromion. No acute fractures are identified. No AVN. The [facility] joint is intact. The acromion is type 2 in shape. No significant lateral downsloping or subacromial spurring. Large full-thickness retracted rotator cuff tear as demonstrated on the MRI. There is also associated chronic fatty atrophy of the rotator cuff muscles. The simple lipoma is noted in the proximal supraspinatus muscle. Large joint effusion and large amount of fluid in the subacromial/subdeltoid bursa. The visualized right ribs are intact and the visualized right lung is grossly clear. No worrisome pulmonary lesions. IMPRESSION: 1. Large full-thickness retracted rotator cuff tear as demonstrated on  the MRI. Associated severe chronic fatty atrophy of the rotator cuff muscles. 2. Superior and lateral subluxation of the humeral head in relation to the glenoid fossa. 3. Moderate glenohumeral joint degenerative changes. 4. Large joint effusion and large amount of fluid in the subacromial/subdeltoid bursa. Electronically Signed   By: Marijo Sanes M.D.   On: 01/15/2021 16:58   MR SHOULDER RIGHT WO CONTRAST  Result Date: 01/15/2021 CLINICAL DATA:  Right shoulder pain. EXAM: MRI OF THE RIGHT SHOULDER WITHOUT CONTRAST TECHNIQUE: Multiplanar, multisequence MR imaging of the shoulder was performed. No intravenous contrast was administered. COMPARISON:  Radiographs 01/14/2021 FINDINGS: Examination is quite limited due to significant motion artifact. Rotator cuff: Large full-thickness retracted rotator cuff tear. The supraspinatus, infraspinatus and subscapularis tendons are completely torn and retracted. The subscapularis tendon is torn and flipped into the glenohumeral joint. Associated marked narrowing of the humeroacromial space with superior and anterior subluxation of the humeral head in relation to the glenoid fossa. Muscles:  Significant fatty atrophy of the rotator cuff muscles. Biceps long head: Not identified. Likely chronically torn and retracted. Acromioclavicular Joint: Moderate degenerative changes. Type 2 acromion. No significant lateral downsloping. Mild subacromial spurring. Glenohumeral Joint: Moderate degenerative changes. Large joint effusion and moderate synovitis. Labrum:  Degenerated and torn. Bones: Bone contusions involving the humeral head but no definite fracture. No glenoid fractures are identified. Bone contusion involving the coracoid process without definite fracture. Other: Large amount of fluid in the subacromial/subdeltoid bursa due to the large rotator cuff tear. IMPRESSION: 1. Large full-thickness retracted rotator cuff tear as described above. See associated chronic fatty atrophy  of the rotator cuff muscles. 2. Superior and lateral subluxation of the humeral head. 3. Likely chronically  torn and retracted long head biceps tendon. 4. Bone contusions involving the humeral head and coracoid process without definite fracture. 5. Large joint effusion and moderate synovitis. Electronically Signed   By: Marijo Sanes M.D.   On: 01/15/2021 15:18   No results for input(s): WBC, HGB, HCT, PLT in the last 72 hours.  No results for input(s): NA, K, CL, CO2, GLUCOSE, BUN, CREATININE, CALCIUM in the last 72 hours.   Intake/Output Summary (Last 24 hours) at 01/17/2021 0913 Last data filed at 01/16/2021 1700 Gross per 24 hour  Intake 600 ml  Output --  Net 600 ml        Physical Exam: BP 122/72 (BP Location: Right Arm)   Pulse 72   Temp 97.6 F (36.4 C) (Oral)   Resp 16   Ht 5\' 7"  (1.702 m)   Wt 80.4 kg   SpO2 94%   BMI 27.76 kg/m      General: awake, alert, appropriate, sitting up in w/c at bedside; NAD HENT: conjugate gaze; oropharynx moist CV: regular rate; no JVD Pulmonary: CTA B/L; no W/R/R- good air movement- sounds great- no wheezing good air movement after using ICS frequently GI: soft, NT, ND, (+)BS; hyperactive Psychiatric: appropriate; interactive Neurological: Ox3  Sensation intact to LT in BUE Fine motor able to oppose finger to thumb in BUEs Unable to lift RUE against gravity- has what appears to be R shoulder anteriorly dislocated on exam- is chronic-no change Motor: Bilateral upper extremities: Shoulder abduction 2/5, elbow flexion3+ /extension 2+/5, handgrip 4/5, right weaker than left, stable Bilateral lower extremities: 4-/5 proximal distal Extremities: 1+ LE edema to lower calves B/L   Assessment/Plan: 1. Functional deficits which require 3+ hours per day of interdisciplinary therapy in a comprehensive inpatient rehab setting. Physiatrist is providing close team supervision and 24 hour management of active medical problems listed  below. Physiatrist and rehab team continue to assess barriers to discharge/monitor patient progress toward functional and medical goals   Care Tool:  Bathing    Body parts bathed by patient: Right arm, Left arm, Chest, Abdomen, Front perineal area, Right upper leg, Left upper leg   Body parts bathed by helper: Face, Buttocks, Left lower leg, Right lower leg     Bathing assist Assist Level: Moderate Assistance - Patient 50 - 74%     Upper Body Dressing/Undressing Upper body dressing   What is the patient wearing?: Button up shirt    Upper body assist Assist Level: Maximal Assistance - Patient 25 - 49%    Lower Body Dressing/Undressing Lower body dressing      What is the patient wearing?: Pants, Incontinence brief     Lower body assist Assist for lower body dressing: Maximal Assistance - Patient 25 - 49%     Toileting Toileting    Toileting assist Assist for toileting: Moderate Assistance - Patient 50 - 74%     Transfers Chair/bed transfer  Transfers assist     Chair/bed transfer assist level: Contact Guard/Touching assist Chair/bed transfer assistive device: Programmer, multimedia   Ambulation assist      Assist level: Contact Guard/Touching assist Assistive device: Walker-rolling Max distance: 125'   Walk 10 feet activity   Assist     Assist level: Contact Guard/Touching assist Assistive device: Walker-rolling   Walk 50 feet activity   Assist Walk 50 feet with 2 turns activity did not occur: Safety/medical concerns  Assist level: Contact Guard/Touching assist Assistive device: Walker-rolling    Walk 150 feet  activity   Assist Walk 150 feet activity did not occur: Safety/medical concerns         Walk 10 feet on uneven surface  activity   Assist Walk 10 feet on uneven surfaces activity did not occur: Safety/medical concerns         Wheelchair     Assist Is the patient using a wheelchair?: Yes Type of  Wheelchair: Manual    Wheelchair assist level: Supervision/Verbal cueing Max wheelchair distance: 136 ft    Wheelchair 50 feet with 2 turns activity    Assist        Assist Level: Supervision/Verbal cueing   Wheelchair 150 feet activity     Assist  Wheelchair 150 feet activity did not occur: Safety/medical concerns   Assist Level: Total Assistance - Patient < 25%    Medical Problem List and Plan: 1.  Cervical myelopathy s/p C3-4 ACDF on 01/01/21  -con't PT and OT- CIR- no limitation of RUE movement/use 2.  Impaired mobility:  -DVT/anticoagulation:  Pharmaceutical: Heparin--now d/ced Was on Eliquis PTA--held due to surgery--> resumed on 9/25             -antiplatelet therapy: N/A 3. [facility]state OA left hip/Pain Management: Oxycodone prn             Continue voltaren gel to left hip QID             Xray showing left hip OA  9/28- pt needs R shoulder reverse shoulder arthroplasty- MRI and CT done- con't pain regimen for now.   9/29- also needs L hip replacement long term Monitor with increased exertion 4. Mood: LCSW to follow for evaluation and support.              -antipsychotic agents: N/A 5. Neuropsych: This patient is capable of making decisions on his own behalf. 6. Bilateral knee abraisons/cervical insicion:  Routine pressure relief measures--monitor incision for healing.              --D/ced bacitracin ointement Continue Aquacell with dry dressing to bilateral knee abrasions.  7. Fluids/Electrolytes/Nutrition: Monitor I/Os.  8. CAF: Monitor HR TID--continue to hold eliquis till 9/23             --On coreg BID.   9/27- change to daily weights  9/28- weight stable to lower- con't regimen Filed Weights   01/03/21 1653 01/16/21 0539  Weight: 83.2 kg 80.4 kg  9.  Dehydration: Encourage fluid intake. Monitor orthostatic symptoms 10. Hypoxia/ Productive cough: Will down grade diet to dysphagia 3 with aspiration precautions/supervison             --has been cleared  to remove collar for meals.              --CXR showing atelectasis  Wean off supplemental oxygen 11.  Prediabetes diet controlled?: Has been off meds for 5 years.  Hemoglobin A1c 5.7 on 9/16 Relatively controlled on 9/21 Monitor with increased mobility 12. HTN: Monitor BP TID--continue Lasix and Cozaar. BP currently soft but as expected for SCI Vitals:   01/16/21 2141 01/17/21 0545  BP: 107/67 122/72  Pulse: 67 72  Resp: 15 16  Temp: 97.8 F (36.6 C) 97.6 F (36.4 C)  SpO2: 97% 94%  Controlled monitor for orthostatic changes  13.  Macrocytic anemia:   Folate within normal limits on 9/16 Hemoglobin 11.5 on 9/19 14. OSA: Does not use CPAP. 15. Constipation: Has not had BM for a couple of days  Added Senna S.   MiraLAX started on 9/16 16. Overweight BMI 28.82: provide dietary education 17.  Hypokalemia  Potassium 3.9 on 9/19, plan to repeat labs on Friday  Supplemented increased on 9/16  18.  Hypoalbuminemia  Supplement initiated on 9/16 19.  Right> Left shoulder abd limitation with ant subluxation deformity on RIght side which appears chronic . Pt denies hx of shoulder surgery.  No pain with ROM, no xrays in Epic will order shoulder 2 V RIght   9/27- called Ortho about shoulder dislocation on R- will get CT and MRI- needs reverse shoulder surgery done eventually- can WBAT and no limitation on fROM per Ortho  9/28- MRI and CT done for surgical planning- torn and retracted biceps tendon; retracted rotator cuff tear.  20. Cough  9/28- asked pt to use ICS q1 hour while awake.   9/29- sounds MUCH better- con't ICS while awake 21. Mild LE edema  9/29- will add TEDs when up.      LOS: 14 days A FACE TO FACE EVALUATION WAS PERFORMED  Charnise Lovan 01/17/2021, 9:13 AM

## 2021-01-17 NOTE — Progress Notes (Signed)
Physical Therapy Session Note  Patient Details  Name: Logan [name] MRN: [id number] Date of Birth: 06-13-1945  Today's Date: 01/17/2021 PT Individual Time: 0930-1030 PT Individual Time Calculation (min): 60 min   Short Term Goals: Week 1:  PT Short Term Goal 1 (Week 1): Pt will perform supine to sit w/mod assist and LRAD PT Short Term Goal 1 - Progress (Week 1): Met PT Short Term Goal 2 (Week 1): Pt will transfer bed to/from wc w/mod assist and LRAD PT Short Term Goal 2 - Progress (Week 1): Met PT Short Term Goal 3 (Week 1): Pt will ambulate 67f w/min assist and LRAD PT Short Term Goal 3 - Progress (Week 1): Met Week 2:  PT Short Term Goal 1 (Week 2): =LTGs d/t ELOS  Skilled Therapeutic Interventions/Progress Updates:    Pt received in recliner and agreeable to therapy.  Pt reports soreness in L hip and R shoulder. Pt transported to therapy gym for time management and energy conservation. Pt participated in ther ex on kinetron, 3 x 2 min, 1 x 4 min for posterior chain strengthening, 50 cm/s and reporting RPE=13. Pt then ambulated x 60 and requested to stop d/t hip pain. After rest break, pt ambulated x 100 ft with RW and CGA, demoing mild antalgic gait d/t L hip. Pt then navigated curb step x 2 with min A and RW, max VC and encouragement. Pt handed off to PT, [name] following treatment session.  Therapy Documentation Precautions:  Precautions Precautions: Fall, Cervical Required Braces or Orthoses: Cervical Brace Cervical Brace: Hard collar, At all times, Other (comment) Restrictions Weight Bearing Restrictions: No     Therapy/Group: Individual Therapy  OMickel Fuchs9/29/2022, 4:36 PM

## 2021-01-18 LAB — CBC WITH DIFFERENTIAL/PLATELET
Abs Immature Granulocytes: 0.15 10*3/uL — ABNORMAL HIGH (ref 0.00–0.07)
Basophils Absolute: 0.1 10*3/uL (ref 0.0–0.1)
Basophils Relative: 1 %
Eosinophils Absolute: 0.2 10*3/uL (ref 0.0–0.5)
Eosinophils Relative: 2 %
HCT: 37.6 % — ABNORMAL LOW (ref 39.0–52.0)
Hemoglobin: 12.3 g/dL — ABNORMAL LOW (ref 13.0–17.0)
Immature Granulocytes: 2 %
Lymphocytes Relative: 21 %
Lymphs Abs: 1.7 10*3/uL (ref 0.7–4.0)
MCH: 34 pg (ref 26.0–34.0)
MCHC: 32.7 g/dL (ref 30.0–36.0)
MCV: 103.9 fL — ABNORMAL HIGH (ref 80.0–100.0)
Monocytes Absolute: 0.6 10*3/uL (ref 0.1–1.0)
Monocytes Relative: 7 %
Neutro Abs: 5.4 10*3/uL (ref 1.7–7.7)
Neutrophils Relative %: 67 %
Platelets: 312 10*3/uL (ref 150–400)
RBC: 3.62 MIL/uL — ABNORMAL LOW (ref 4.22–5.81)
RDW: 15.1 % (ref 11.5–15.5)
WBC: 8 10*3/uL (ref 4.0–10.5)
nRBC: 0 % (ref 0.0–0.2)

## 2021-01-18 LAB — GLUCOSE, CAPILLARY
Glucose-Capillary: 86 mg/dL (ref 70–99)
Glucose-Capillary: 91 mg/dL (ref 70–99)
Glucose-Capillary: 89 mg/dL (ref 70–99)
Glucose-Capillary: 121 mg/dL — ABNORMAL HIGH (ref 70–99)

## 2021-01-18 LAB — COMPREHENSIVE METABOLIC PANEL
ALT: 23 U/L (ref 0–44)
AST: 14 U/L — ABNORMAL LOW (ref 15–41)
Albumin: 3 g/dL — ABNORMAL LOW (ref 3.5–5.0)
Alkaline Phosphatase: 87 U/L (ref 38–126)
Anion gap: 8 (ref 5–15)
BUN: 26 mg/dL — ABNORMAL HIGH (ref 8–23)
CO2: 30 mmol/L (ref 22–32)
Calcium: 9.3 mg/dL (ref 8.9–10.3)
Chloride: 102 mmol/L (ref 98–111)
Creatinine, Ser: 0.9 mg/dL (ref 0.61–1.24)
GFR, Estimated: >60 mL/min (ref >60)
Glucose, Bld: 124 mg/dL — ABNORMAL HIGH (ref 70–99)
Potassium: 4.2 mmol/L (ref 3.5–5.1)
Sodium: 140 mmol/L (ref 135–145)
Total Bilirubin: 0.4 mg/dL (ref 0.3–1.2)
Total Protein: 5.5 g/dL — ABNORMAL LOW (ref 6.5–8.1)

## 2021-01-18 MED ORDER — SODIUM CHLORIDE 0.9 % IV SOLN: INTRAVENOUS | Status: AC

## 2021-01-18 NOTE — Progress Notes (Signed)
Occupational Therapy Session Note  Patient Details  Name: Logan [name] MRN: [id number] Date of Birth: [birth year]  Today's Date: 01/19/2021 OT Individual Time: 0950-1101 OT Individual Time Calculation (min): 71 min   Skilled Therapeutic Interventions/Progress Updates:    Pt greeted in the bed, fatigued and c/o 6/10 pain in the Lt hip. Per pt, premedicated with tylenol but wanting something stronger. Per RN, she needs an MD order to give him stronger pain medicine. Rest breaks and repositioning in bed at end of session provided as pain mgt interventions. Started with face washing bedlevel, OT removed his c-collar and supported head. Vcs to pt for keeping neck in neutral alignment. Pt unable to reach is neck/face using either UE to wash. Max A to meet task demands. Pt wanted to try walking today, agreeable to first attempt standing. Supine<sit completed with Min A, HOB elevated using the bedrail. Heavy Min A for sit<stand with vcs. Pt initiated ambulating towards the doorway given Min A, pt with unsteady/shakiness in LEs and narrow base of support. He sat down in the w/c and worked on sit<stands while donning brief and elevating his pants again (pt with a great deal of flatulence while walking, reported he had a suppository last night and was agreeable to wear a brief for safety). Min-Mod A fading to close supervision for sit<stands. Next, escorted him to the dayroom. Worked on dynamic standing balance by ambulating using RW, CGA overall especially during turns. Vcs for widening base of support. He ambulated 55 ft x2 bouts. Tried using the shoe horn to doff/don his shoes while seated. Pt required increased time for problem solving but ultimately needed Max A to don shoes, unable to retrieve his shoe horn when it had fallen on the floor. Pt then returned to the room and completed a short distance ambulatory transfer back to bed using RW. Supervision for sit<supine, note that pt relied heavily on the bedrails for  this transition. He reports he will not have any bedrails at home but that he'll be able to meet task demands without them. He remained comfortably in the bed at close of session, all needs within reach and bed alarm set.   Therapy Documentation Precautions:  Precautions Precautions: Fall, Cervical Required Braces or Orthoses: Cervical Brace Cervical Brace: Hard collar, At all times, Other (comment) Restrictions Weight Bearing Restrictions: No  ADL: ADL Grooming: Dependent Where Assessed-Grooming: Sitting at sink Upper Body Bathing: Minimal assistance Where Assessed-Upper Body Bathing: Sitting at sink Lower Body Bathing: Maximal assistance Where Assessed-Lower Body Bathing: Sitting at sink, Standing at sink Upper Body Dressing: Maximal assistance Where Assessed-Upper Body Dressing: Sitting at sink Lower Body Dressing: Maximal assistance Where Assessed-Lower Body Dressing: Sitting at sink, Standing at sink     Therapy/Group: Individual Therapy  Chantalle Defilippo A Serapio Edelson 01/19/2021, 12:36 PM

## 2021-01-18 NOTE — Progress Notes (Signed)
Patient had 1 loose/mushy stool before bowel program Bowel program completed 2 additional medium sized loose/mushy bowel movements completed. No other concerns to report.

## 2021-01-18 NOTE — Progress Notes (Signed)
Occupational Therapy Session Note  Patient Details  Name: Logan [name] MRN: [id number] Date of Birth: October 26, 1945  Today's Date: 01/18/2021 OT Individual Time: 3790-2409 OT Individual Time Calculation (min): 20 min 10 min missed time   Short Term Goals: Week 1:  OT Short Term Goal 1 (Week 1): Pt will complete sit<>stand transfers using LRAD with CGA in preperation for dressing and toileting OT Short Term Goal 1 - Progress (Week 1): Met OT Short Term Goal 2 (Week 1): Pt will complete UB dressing with min assist OT Short Term Goal 2 - Progress (Week 1): Not met OT Short Term Goal 3 (Week 1): Pt will increased right shoulder scaption to 50 degrees for functional reaching tasks. OT Short Term Goal 3 - Progress (Week 1): Progressing toward goal OT Short Term Goal 4 (Week 1): Pt will donn shorts using reacher with min assist. OT Short Term Goal 4 - Progress (Week 1): Progressing toward goal  Skilled Therapeutic Interventions/Progress Updates:     Pt received in bed with L hip pain- repositioned at EOB. Pt requires increased encouragement to participate. Reporting getting IV soon. IV team arrived at end of session pt missing 10 min d/t getting IV placed. SUP<>sit wit MOD A overall for LE management ADL: Pt completes ADL at overall min-mod Level at EOB. Skilled interventions include: cuing for shirt donning sequence which pt does not follow despite cuing  for donning button up like tshirt (RUE, head, LUE). Pt also educated on magnetic closure shirts to improve independence with fastening button up shirts.  Pt left at end of session in bed with exit alarm on, call light in reach and all needs met. IV team placing IV   Therapy Documentation Precautions:  Precautions Precautions: Fall, Cervical Required Braces or Orthoses: Cervical Brace Cervical Brace: Hard collar, At all times, Other (comment) Restrictions Weight Bearing Restrictions: No General:    Therapy/Group: Individual  Therapy  Tonny Branch 01/18/2021, 6:51 AM

## 2021-01-18 NOTE — Progress Notes (Signed)
[facility] PHYSICAL MEDICINE & REHABILITATION PROGRESS NOTE  Subjective/Complaints:   Pt feels weak all over- had diarrhea per pt last night- loose/mushy- 3 Bms last evening- 2 after bowel program . No abd pain, no cramping;  Admits not drinking water/well.  Ate 100% breakfast.     ROS:   Pt denies SOB, abd pain, CP, N/V/C/D, and vision changes   Objective: Vital Signs: Blood pressure (!) 103/54, pulse 71, temperature 99.3 F (37.4 C), temperature source Oral, resp. rate 16, height 5\' 7"  (1.702 m), weight 81.3 kg, SpO2 99 %. No results found. Recent Labs    01/18/21 0739  WBC 8.0  HGB 12.3*  HCT 37.6*  PLT 312    Recent Labs    01/18/21 0739  NA 140  K 4.2  CL 102  CO2 30  GLUCOSE 124*  BUN 26*  CREATININE 0.90  CALCIUM 9.3     Intake/Output Summary (Last 24 hours) at 01/18/2021 3382 Last data filed at 01/17/2021 2210 Gross per 24 hour  Intake 1200 ml  Output --  Net 1200 ml        Physical Exam: BP (!) 103/54 (BP Location: Left Arm)   Pulse 71   Temp 99.3 F (37.4 C) (Oral)   Resp 16   Ht 5\' 7"  (1.702 m)   Wt 81.3 kg   SpO2 99%   BMI 28.07 kg/m       General: awake, alert, appropriate,  sitting up in bed; appears more tired; NAD HENT: conjugate gaze; oropharynx moist CV: regular rate; no JVD Pulmonary: CTA B/L; no W/R/R- good air movement GI: soft, NT, ND, (+)BS; hyperactive BS Psychiatric: appropriate- but appears tired; c/o being weak Neurological: Ox3 Decreased effort with exam? Vs fatigue Sensation intact to LT in BUE Fine motor able to oppose finger to thumb in BUEs Unable to lift RUE against gravity- has what appears to be R shoulder anteriorly dislocated on exam- is chronic-no change Motor: Bilateral upper extremities: Shoulder abduction 2/5, elbow flexion3+ /extension 2+/5, handgrip 4/5, right weaker than left, stable Bilateral lower extremities: 4-/5 proximal distal Extremities: 1+ LE edema to lower calves  B/L   Assessment/Plan: 1. Functional deficits which require 3+ hours per day of interdisciplinary therapy in a comprehensive inpatient rehab setting. Physiatrist is providing close team supervision and 24 hour management of active medical problems listed below. Physiatrist and rehab team continue to assess barriers to discharge/monitor patient progress toward functional and medical goals   Care Tool:  Bathing    Body parts bathed by patient: Right arm, Left arm, Chest, Abdomen, Front perineal area, Right upper leg, Left upper leg   Body parts bathed by helper: Face, Buttocks, Left lower leg, Right lower leg     Bathing assist Assist Level: Moderate Assistance - Patient 50 - 74%     Upper Body Dressing/Undressing Upper body dressing   What is the patient wearing?: Button up shirt    Upper body assist Assist Level: Maximal Assistance - Patient 25 - 49%    Lower Body Dressing/Undressing Lower body dressing      What is the patient wearing?: Pants, Incontinence brief     Lower body assist Assist for lower body dressing: Maximal Assistance - Patient 25 - 49%     Toileting Toileting    Toileting assist Assist for toileting: Moderate Assistance - Patient 50 - 74%     Transfers Chair/bed transfer  Transfers assist     Chair/bed transfer assist level: Contact Guard/Touching assist Chair/bed  transfer assistive device: Museum/gallery exhibitions officer assist      Assist level: Contact Guard/Touching assist Assistive device: Walker-rolling Max distance: 125'   Walk 10 feet activity   Assist     Assist level: Contact Guard/Touching assist Assistive device: Walker-rolling   Walk 50 feet activity   Assist Walk 50 feet with 2 turns activity did not occur: Safety/medical concerns  Assist level: Contact Guard/Touching assist Assistive device: Walker-rolling    Walk 150 feet activity   Assist Walk 150 feet activity did not occur:  Safety/medical concerns         Walk 10 feet on uneven surface  activity   Assist Walk 10 feet on uneven surfaces activity did not occur: Safety/medical concerns         Wheelchair     Assist Is the patient using a wheelchair?: Yes Type of Wheelchair: Manual    Wheelchair assist level: Supervision/Verbal cueing Max wheelchair distance: 136 ft    Wheelchair 50 feet with 2 turns activity    Assist        Assist Level: Supervision/Verbal cueing   Wheelchair 150 feet activity     Assist  Wheelchair 150 feet activity did not occur: Safety/medical concerns   Assist Level: Total Assistance - Patient < 25%    Medical Problem List and Plan: 1.  Cervical myelopathy s/p C3-4 ACDF on 01/01/21  Con't PT and OT- as tolerated today 2.  Impaired mobility:  -DVT/anticoagulation:  Pharmaceutical: Heparin--now d/ced Was on Eliquis PTA--held due to surgery--> resumed on 9/25             -antiplatelet therapy: N/A 3. Endstate OA left hip/Pain Management: Oxycodone prn             Continue voltaren gel to left hip QID             Xray showing left hip OA  9/28- pt needs R shoulder reverse shoulder arthroplasty- MRI and CT done- con't pain regimen for now.   9/29- also needs L hip replacement long term Monitor with increased exertion 4. Mood: LCSW to follow for evaluation and support.              -antipsychotic agents: N/A 5. Neuropsych: This patient is capable of making decisions on his own behalf. 6. Bilateral knee abraisons/cervical insicion:  Routine pressure relief measures--monitor incision for healing.              --D/ced bacitracin ointement Continue Aquacell with dry dressing to bilateral knee abrasions.  7. Fluids/Electrolytes/Nutrition: Monitor I/Os.  8. CAF: Monitor HR TID--continue to hold eliquis till 9/23             --On coreg BID.   9/27- change to daily weights  9/28- weight stable to lower- con't regimen Filed Weights   01/16/21 0539 01/17/21  1622 01/18/21 0500  Weight: 80.4 kg 80.7 kg 81.3 kg  9.  Dehydration: Encourage fluid intake. Monitor orthostatic symptoms  9/30- Pt's BUN up to 26- will give IVFs x 1 day 75cc/hour- and push fluids- c/o being weak- might need U/A and Cx if has fever? 10. Hypoxia/ Productive cough: Will down grade diet to dysphagia 3 with aspiration precautions/supervison             --has been cleared to remove collar for meals.              --CXR showing atelectasis  Wean off supplemental oxygen 11.  Prediabetes diet controlled?: Has  been off meds for 5 years.  Hemoglobin A1c 5.7 on 9/16 Relatively controlled on 9/21 Monitor with increased mobility 12. HTN: Monitor BP TID--continue Lasix and Cozaar. BP currently soft but as expected for SCI Vitals:   01/17/21 1515 01/17/21 2210  BP: 136/79 (!) 103/54  Pulse: 72 71  Resp: 18 16  Temp: 97.6 F (36.4 C) 99.3 F (37.4 C)  SpO2: 99% 99%  Controlled monitor for orthostatic changes  13.  Macrocytic anemia:   Folate within normal limits on 9/16 Hemoglobin 11.5 on 9/19 14. OSA: Does not use CPAP. 15. Constipation: Has not had BM for a couple of days             Added Senna S.   MiraLAX started on 9/16 16. Overweight BMI 28.82: provide dietary education 17.  Hypokalemia  Potassium 3.9 on 9/19, plan to repeat labs on Friday  Supplemented increased on 9/16  18.  Hypoalbuminemia  Supplement initiated on 9/16 19.  Right> Left shoulder abd limitation with ant subluxation deformity on RIght side which appears chronic . Pt denies hx of shoulder surgery.  No pain with ROM, no xrays in Epic will order shoulder 2 V RIght   9/27- called Ortho about shoulder dislocation on R- will get CT and MRI- needs reverse shoulder surgery done eventually- can WBAT and no limitation on fROM per Ortho  9/28- MRI and CT done for surgical planning- torn and retracted biceps tendon; retracted rotator cuff tear.  20. Cough  9/28- asked pt to use ICS q1 hour while awake.   9/29-  sounds MUCH better- con't ICS while awake 21. Mild LE edema  9/29- will add TEDs when up.  22. Generalized weakness  9/30- feeling weak this AM- gave IVFs and might need U/A and Cx.     LOS: 15 days A FACE TO FACE EVALUATION WAS PERFORMED  Logan [name] 01/18/2021, 9:09 AM

## 2021-01-18 NOTE — Progress Notes (Signed)
Physical Therapy Session Note  Patient Details  Name: Logan [name] MRN: [id number] Date of Birth: [birth year]  Today's Date: 01/18/2021 PT Missed Time: 75 Minutes Missed Time Reason: Patient ill (Comment) (Severely dehydrated, feeling ill, on IV)  Short Term Goals: Week 1:  PT Short Term Goal 1 (Week 1): Pt will perform supine to sit w/mod assist and LRAD PT Short Term Goal 1 - Progress (Week 1): Met PT Short Term Goal 2 (Week 1): Pt will transfer bed to/from wc w/mod assist and LRAD PT Short Term Goal 2 - Progress (Week 1): Met PT Short Term Goal 3 (Week 1): Pt will ambulate 64f w/min assist and LRAD PT Short Term Goal 3 - Progress (Week 1): Met Week 2:  PT Short Term Goal 1 (Week 2): =LTGs d/t ELOS  Skilled Therapeutic Interventions/Progress Updates:    Pt asleep on arrival but easily roused. Pt reported feeling extremely fatigued and unable to participate in therapy. On IV d/t severe dehydration s/p diarrhea previous evening. Pt refused any mobility, assist with toileting, etc.  Pt missed 75 min scheduled therapy d/t illness.   Therapy Documentation Precautions:  Precautions Precautions: Fall, Cervical Required Braces or Orthoses: Cervical Brace Cervical Brace: Hard collar, At all times, Other (comment) Restrictions Weight Bearing Restrictions: No General: PT Amount of Missed Time (min): 75 Minutes PT Missed Treatment Reason: Patient ill (Comment) (Severely dehydrated, feeling ill, on IV)   Therapy/Group: Individual Therapy  OMickel Fuchs9/30/2022, 2:31 PM

## 2021-01-19 LAB — GLUCOSE, CAPILLARY
Glucose-Capillary: 113 mg/dL — ABNORMAL HIGH (ref 70–99)
Glucose-Capillary: 95 mg/dL (ref 70–99)
Glucose-Capillary: 98 mg/dL (ref 70–99)
Glucose-Capillary: 161 mg/dL — ABNORMAL HIGH (ref 70–99)

## 2021-01-19 NOTE — Progress Notes (Signed)
Awake most of night. "I can't get comfortable." Declined PRN tylenol, "that's not going to help." Scheduled dulcolax supp. Given at 2012. Refused scheduled miralax. PRN claritin and flonase given per patient's request. PRN trazodone 50mg 's given at 2109, not effective. C-collar in place. OOB to chair at 0500, alarm belt applied. 0.9% NS infusing at 75cc/hr. Congested & productive cough, using [place]. Limited movement to RUE. C/O fingers feeling numb, better since surgery. Patrici Ranks A

## 2021-01-20 LAB — GLUCOSE, CAPILLARY
Glucose-Capillary: 104 mg/dL — ABNORMAL HIGH (ref 70–99)
Glucose-Capillary: 149 mg/dL — ABNORMAL HIGH (ref 70–99)
Glucose-Capillary: 133 mg/dL — ABNORMAL HIGH (ref 70–99)
Glucose-Capillary: 115 mg/dL — ABNORMAL HIGH (ref 70–99)

## 2021-01-20 NOTE — Progress Notes (Signed)
[facility] PHYSICAL MEDICINE & REHABILITATION PROGRESS NOTE  Subjective/Complaints:   Pt reports feels back to baseline- not weak like was on Friday.   Had a few bladder accidents due to delay issues.   Walked >174ft with RW yesterday with PT.  Feeling better and hydrated.    ROS:   Pt denies SOB, abd pain, CP, N/V/C/D, and vision changes  Objective: Vital Signs: Blood pressure (!) 105/52, pulse 67, temperature 97.8 F (36.6 C), temperature source Oral, resp. rate 16, height 5\' 7"  (1.702 m), weight 82.1 kg, SpO2 96 %. No results found. Recent Labs    01/18/21 0739  WBC 8.0  HGB 12.3*  HCT 37.6*  PLT 312    Recent Labs    01/18/21 0739  NA 140  K 4.2  CL 102  CO2 30  GLUCOSE 124*  BUN 26*  CREATININE 0.90  CALCIUM 9.3     Intake/Output Summary (Last 24 hours) at 01/20/2021 1038 Last data filed at 01/20/2021 0746 Gross per 24 hour  Intake 860 ml  Output 1400 ml  Net -540 ml        Physical Exam: BP (!) 105/52   Pulse 67   Temp 97.8 F (36.6 C) (Oral)   Resp 16   Ht 5\' 7"  (1.702 m)   Wt 82.1 kg   SpO2 96%   BMI 28.35 kg/m       General: awake, alert, appropriate, sitting up in bed; off IVFs; NAD HENT: conjugate gaze; oropharynx moist CV: regular rate; no JVD Pulmonary: CTA B/L; no W/R/R- good air movement GI: soft, NT, ND, (+)BS Psychiatric: appropriate; interactive Neurological: Ox3 Strength exam back to prior.  Sensation intact to LT in BUE Fine motor able to oppose finger to thumb in BUEs Unable to lift RUE against gravity- has what appears to be R shoulder anteriorly dislocated on exam- is chronic-no change Motor: Bilateral upper extremities: Shoulder abduction 2/5, elbow flexion3+ /extension 2+/5, handgrip 4/5, right weaker than left, stable Bilateral lower extremities: 4-/5 proximal distal Extremities: 1+ LE edema to lower calves B/L   Assessment/Plan: 1. Functional deficits which require 3+ hours per day of interdisciplinary  therapy in a comprehensive inpatient rehab setting. Physiatrist is providing close team supervision and 24 hour management of active medical problems listed below. Physiatrist and rehab team continue to assess barriers to discharge/monitor patient progress toward functional and medical goals   Care Tool:  Bathing    Body parts bathed by patient: Right arm, Left arm, Chest, Abdomen, Front perineal area, Right upper leg, Left upper leg   Body parts bathed by helper: Face, Buttocks, Left lower leg, Right lower leg     Bathing assist Assist Level: Moderate Assistance - Patient 50 - 74%     Upper Body Dressing/Undressing Upper body dressing   What is the patient wearing?: Button up shirt    Upper body assist Assist Level: Maximal Assistance - Patient 25 - 49%    Lower Body Dressing/Undressing Lower body dressing      What is the patient wearing?: Pants, Incontinence brief     Lower body assist Assist for lower body dressing: Maximal Assistance - Patient 25 - 49%     Toileting Toileting    Toileting assist Assist for toileting: Moderate Assistance - Patient 50 - 74%     Transfers Chair/bed transfer  Transfers assist     Chair/bed transfer assist level: Contact Guard/Touching assist Chair/bed transfer assistive device: Programmer, multimedia   Ambulation assist  Assist level: Contact Guard/Touching assist Assistive device: Walker-rolling Max distance: 125'   Walk 10 feet activity   Assist     Assist level: Contact Guard/Touching assist Assistive device: Walker-rolling   Walk 50 feet activity   Assist Walk 50 feet with 2 turns activity did not occur: Safety/medical concerns  Assist level: Contact Guard/Touching assist Assistive device: Walker-rolling    Walk 150 feet activity   Assist Walk 150 feet activity did not occur: Safety/medical concerns         Walk 10 feet on uneven surface  activity   Assist Walk 10 feet on  uneven surfaces activity did not occur: Safety/medical concerns         Wheelchair     Assist Is the patient using a wheelchair?: Yes Type of Wheelchair: Manual    Wheelchair assist level: Supervision/Verbal cueing Max wheelchair distance: 136 ft    Wheelchair 50 feet with 2 turns activity    Assist        Assist Level: Supervision/Verbal cueing   Wheelchair 150 feet activity     Assist  Wheelchair 150 feet activity did not occur: Safety/medical concerns   Assist Level: Total Assistance - Patient < 25%    Medical Problem List and Plan: 1.  Cervical myelopathy s/p C3-4 ACDF on 01/01/21  Con't PT and OT/CIR- walked 100+ Ft with RW! 2.  Impaired mobility:  -DVT/anticoagulation:  Pharmaceutical: Heparin--now d/ced Was on Eliquis PTA--held due to surgery--> resumed on 9/25             -antiplatelet therapy: N/A 3. Endstate OA left hip/Pain Management: Oxycodone prn             Continue voltaren gel to left hip QID             Xray showing left hip OA  9/28- pt needs R shoulder reverse shoulder arthroplasty- MRI and CT done- con't pain regimen for now.   9/29- also needs L hip replacement long term  10/2- pain controlled overall-c on't regimen Monitor with increased exertion 4. Mood: LCSW to follow for evaluation and support.              -antipsychotic agents: N/A 5. Neuropsych: This patient is capable of making decisions on his own behalf. 6. Bilateral knee abraisons/cervical insicion:  Routine pressure relief measures--monitor incision for healing.              --D/ced bacitracin ointement Continue Aquacell with dry dressing to bilateral knee abrasions.  7. Fluids/Electrolytes/Nutrition: Monitor I/Os.  8. CAF: Monitor HR TID--continue to hold eliquis till 9/23             --On coreg BID.   9/27- change to daily weights  9/28- weight stable to lower- con't regimen  10/2- weight up slightly, but back to baseline- con't to monitor Filed Weights   01/18/21  0500 01/19/21 0600 01/20/21 0448  Weight: 81.3 kg 81.8 kg 82.1 kg  9.  Dehydration: Encourage fluid intake. Monitor orthostatic symptoms  9/30- Pt's BUN up to 26- will give IVFs x 1 day 75cc/hour- and push fluids- c/o being weak- might need U/A and Cx if has fever? 10. Hypoxia/ Productive cough: Will down grade diet to dysphagia 3 with aspiration precautions/supervison             --has been cleared to remove collar for meals.              --CXR showing atelectasis  Wean off supplemental oxygen 11.  Prediabetes diet controlled?: Has been off meds for 5 years.  Hemoglobin A1c 5.7 on 9/16 Relatively controlled on 9/21 Monitor with increased mobility 12. HTN: Monitor BP TID--continue Lasix and Cozaar. BP currently soft but as expected for SCI Vitals:   01/20/21 0448 01/20/21 0745  BP: (!) 113/48 (!) 105/52  Pulse: 66 67  Resp: 16   Temp: 97.8 F (36.6 C)   SpO2: 96%   Controlled monitor for orthostatic changes  13.  Macrocytic anemia:   Folate within normal limits on 9/16 Hemoglobin 11.5 on 9/19 14. OSA: Does not use CPAP. 15. Constipation: Has not had BM for a couple of days             Added Senna S.   MiraLAX started on 9/16 16. Overweight BMI 28.82: provide dietary education 17.  Hypokalemia  Potassium 3.9 on 9/19, plan to repeat labs on Friday  Supplemented increased on 9/16  18.  Hypoalbuminemia  Supplement initiated on 9/16 19.  Right> Left shoulder abd limitation with ant subluxation deformity on RIght side which appears chronic . Pt denies hx of shoulder surgery.  No pain with ROM, no xrays in Epic will order shoulder 2 V RIght   9/27- called Ortho about shoulder dislocation on R- will get CT and MRI- needs reverse shoulder surgery done eventually- can WBAT and no limitation on fROM per Ortho  9/28- MRI and CT done for surgical planning- torn and retracted biceps tendon; retracted rotator cuff tear.  20. Cough  9/28- asked pt to use ICS q1 hour while awake.   9/29-  sounds MUCH better- con't ICS while awake 21. Mild LE edema  9/29- will add TEDs when up.  22. Generalized weakness  9/30- feeling weak this AM- gave IVFs and might need U/A and Cx.   10/2- resolved- con't to monitor   LOS: 17 days A FACE TO [place] 01/20/2021, 10:38 AM

## 2021-01-21 ENCOUNTER — Ambulatory Visit: Payer: Medicare HMO | Admitting: Cardiology

## 2021-01-21 LAB — CBC WITH DIFFERENTIAL/PLATELET
Abs Immature Granulocytes: 0.14 10*3/uL — ABNORMAL HIGH (ref 0.00–0.07)
Basophils Absolute: 0.1 10*3/uL (ref 0.0–0.1)
Basophils Relative: 1 %
Eosinophils Absolute: 0.2 10*3/uL (ref 0.0–0.5)
Eosinophils Relative: 3 %
HCT: 35.9 % — ABNORMAL LOW (ref 39.0–52.0)
Hemoglobin: 12 g/dL — ABNORMAL LOW (ref 13.0–17.0)
Immature Granulocytes: 2 %
Lymphocytes Relative: 26 %
Lymphs Abs: 2.1 10*3/uL (ref 0.7–4.0)
MCH: 34.3 pg — ABNORMAL HIGH (ref 26.0–34.0)
MCHC: 33.4 g/dL (ref 30.0–36.0)
MCV: 102.6 fL — ABNORMAL HIGH (ref 80.0–100.0)
Monocytes Absolute: 0.8 10*3/uL (ref 0.1–1.0)
Monocytes Relative: 10 %
Neutro Abs: 4.7 10*3/uL (ref 1.7–7.7)
Neutrophils Relative %: 58 %
Platelets: 260 10*3/uL (ref 150–400)
RBC: 3.5 MIL/uL — ABNORMAL LOW (ref 4.22–5.81)
RDW: 15 % (ref 11.5–15.5)
WBC: 8 10*3/uL (ref 4.0–10.5)
nRBC: 0 % (ref 0.0–0.2)

## 2021-01-21 LAB — BASIC METABOLIC PANEL
Anion gap: 7 (ref 5–15)
BUN: 28 mg/dL — ABNORMAL HIGH (ref 8–23)
CO2: 28 mmol/L (ref 22–32)
Calcium: 8.8 mg/dL — ABNORMAL LOW (ref 8.9–10.3)
Chloride: 105 mmol/L (ref 98–111)
Creatinine, Ser: 0.87 mg/dL (ref 0.61–1.24)
GFR, Estimated: >60 mL/min (ref >60)
Glucose, Bld: 90 mg/dL (ref 70–99)
Potassium: 3.9 mmol/L (ref 3.5–5.1)
Sodium: 140 mmol/L (ref 135–145)

## 2021-01-21 LAB — GLUCOSE, CAPILLARY
Glucose-Capillary: 90 mg/dL (ref 70–99)
Glucose-Capillary: 88 mg/dL (ref 70–99)
Glucose-Capillary: 105 mg/dL — ABNORMAL HIGH (ref 70–99)
Glucose-Capillary: 125 mg/dL — ABNORMAL HIGH (ref 70–99)

## 2021-01-21 NOTE — Progress Notes (Signed)
Logan [name] PHYSICAL MEDICINE & REHABILITATION PROGRESS NOTE  Subjective/Complaints:   Pt reports LBM this AM- going less often.  No issues.  Wants IV out if possible.    ROS:   Pt denies SOB, abd pain, CP, N/V/C/D, and vision changes   Objective: Vital Signs: Blood pressure 121/67, pulse 66, temperature 97.7 F (36.5 C), temperature source Oral, resp. rate 16, height 5\' 7"  (1.702 m), weight 85.9 kg, SpO2 97 %. No results found. Recent Labs    01/21/21 0529  WBC 8.0  HGB 12.0*  HCT 35.9*  PLT 260    Recent Labs    01/21/21 0529  NA 140  K 3.9  CL 105  CO2 28  GLUCOSE 90  BUN 28*  CREATININE 0.87  CALCIUM 8.8*     Intake/Output Summary (Last 24 hours) at 01/21/2021 1031 Last data filed at 01/21/2021 6629 Gross per 24 hour  Intake 960 ml  Output 300 ml  Net 660 ml        Physical Exam: BP 121/67   Pulse 66   Temp 97.7 F (36.5 C) (Oral)   Resp 16   Ht 5\' 7"  (1.702 m)   Wt 85.9 kg   SpO2 97%   BMI 29.66 kg/m        General: awake, alert, appropriate, sitting up in bedside chair- transferred from [facility] with gait belt and NT; NAD HENT: conjugate gaze; oropharynx moist CV: regular rate; no JVD Pulmonary: CTA B/L; no W/R/R- good air movement GI: soft, NT, ND, (+)BS Psychiatric: appropriate Neurological: Ox3 Sensation intact to LT in BUE Fine motor able to oppose finger to thumb in BUEs Unable to lift RUE against gravity- has what appears to be R shoulder anteriorly dislocated on exam- is chronic-no change Motor: Bilateral upper extremities: Shoulder abduction 2/5, elbow flexion3+ /extension 2+/5, handgrip 4/5, right weaker than left, stable Bilateral lower extremities: 4-/5 proximal distal Extremities: 1+ LE edema to lower calves B/L   Assessment/Plan: 1. Functional deficits which require 3+ hours per day of interdisciplinary therapy in a comprehensive inpatient rehab setting. Physiatrist is providing close team supervision and 24 hour  management of active medical problems listed below. Physiatrist and rehab team continue to assess barriers to discharge/monitor patient progress toward functional and medical goals   Care Tool:  Bathing    Body parts bathed by patient: Right arm, Left arm, Chest, Abdomen, Front perineal area, Right upper leg, Left upper leg   Body parts bathed by helper: Face, Buttocks, Left lower leg, Right lower leg     Bathing assist Assist Level: Moderate Assistance - Patient 50 - 74%     Upper Body Dressing/Undressing Upper body dressing   What is the patient wearing?: Button up shirt    Upper body assist Assist Level: Maximal Assistance - Patient 25 - 49%    Lower Body Dressing/Undressing Lower body dressing      What is the patient wearing?: Pants, Incontinence brief     Lower body assist Assist for lower body dressing: Maximal Assistance - Patient 25 - 49%     Toileting Toileting    Toileting assist Assist for toileting: Moderate Assistance - Patient 50 - 74%     Transfers Chair/bed transfer  Transfers assist     Chair/bed transfer assist level: Contact Guard/Touching assist Chair/bed transfer assistive device: Programmer, multimedia   Ambulation assist      Assist level: Contact Guard/Touching assist Assistive device: Walker-rolling Max distance: 125'   Walk 10  feet activity   Assist     Assist level: Contact Guard/Touching assist Assistive device: Walker-rolling   Walk 50 feet activity   Assist Walk 50 feet with 2 turns activity did not occur: Safety/medical concerns  Assist level: Contact Guard/Touching assist Assistive device: Walker-rolling    Walk 150 feet activity   Assist Walk 150 feet activity did not occur: Safety/medical concerns         Walk 10 feet on uneven surface  activity   Assist Walk 10 feet on uneven surfaces activity did not occur: Safety/medical concerns         Wheelchair     Assist Is the  patient using a wheelchair?: Yes Type of Wheelchair: Manual    Wheelchair assist level: Supervision/Verbal cueing Max wheelchair distance: 136 ft    Wheelchair 50 feet with 2 turns activity    Assist        Assist Level: Supervision/Verbal cueing   Wheelchair 150 feet activity     Assist  Wheelchair 150 feet activity did not occur: Safety/medical concerns   Assist Level: Total Assistance - Patient < 25%    Medical Problem List and Plan: 1.  Cervical myelopathy s/p C3-4 ACDF on 01/01/21  Continue CIR- PT, OT /CIR 2.  Impaired mobility:  -DVT/anticoagulation:  Pharmaceutical: Heparin--now d/ced Was on Eliquis PTA--held due to surgery--> resumed on 9/25             -antiplatelet therapy: N/A 3. Endstate OA left hip/Pain Management: Oxycodone prn             Continue voltaren gel to left hip QID             Xray showing left hip OA  9/28- pt needs R shoulder reverse shoulder arthroplasty- MRI and CT done- con't pain regimen for now.   9/29- also needs L hip replacement long term  110/3- pain controlled con't regimen Monitor with increased exertion 4. Mood: LCSW to follow for evaluation and support.              -antipsychotic agents: N/A 5. Neuropsych: This patient is capable of making decisions on his own behalf. 6. Bilateral knee abraisons/cervical insicion:  Routine pressure relief measures--monitor incision for healing.              --D/ced bacitracin ointement Continue Aquacell with dry dressing to bilateral knee abrasions.  7. Fluids/Electrolytes/Nutrition: Monitor I/Os.  8. CAF: Monitor HR TID--continue to hold eliquis till 9/23             --On coreg BID.   9/27- change to daily weights  9/28- weight stable to lower- con't regimen  10/2- weight up slightly, but back to baseline- con't to monitor  10/5- Weight up to 85.9 kg- will recheck in AM and if still rising, will need to go from there.  Filed Weights   01/19/21 0600 01/20/21 0448 01/21/21 0731   Weight: 81.8 kg 82.1 kg 85.9 kg  9.  Dehydration: Encourage fluid intake. Monitor orthostatic symptoms  9/30- Pt's BUN up to 26- will give IVFs x 1 day 75cc/hour- and push fluids- c/o being weak- might need U/A and Cx if has fever?  10/3- BUN up to 28- is more dehydrated, but feeling OK- will monitor and give more IVFs if needed- can d/c IV for now.  10. Hypoxia/ Productive cough: Will down grade diet to dysphagia 3 with aspiration precautions/supervison             --has been cleared  to remove collar for meals.              --CXR showing atelectasis  Wean off supplemental oxygen  10/3- off O2 11.  Prediabetes diet controlled?: Has been off meds for 5 years.  Hemoglobin A1c 5.7 on 9/16 Relatively controlled on 9/21 Monitor with increased mobility 12. HTN: Monitor BP TID--continue Lasix and Cozaar. BP currently soft but as expected for SCI Vitals:   01/20/21 1924 01/21/21 0542  BP: (!) 98/47 121/67  Pulse: 71 66  Resp: 16 16  Temp: 97.7 F (36.5 C) 97.7 F (36.5 C)  SpO2: 100% 97%  Controlled monitor for orthostatic changes  10/3- BP soft this AM-con't regimen 13.  Macrocytic anemia:   Folate within normal limits on 9/16 Hemoglobin 11.5 on 9/19 14. OSA: Does not use CPAP. 15. Constipation: Has not had BM for a couple of days             Added Senna S.   MiraLAX started on 9/16  10/3- was having loose stools, but doing better- will monitor 16. Overweight BMI 28.82: provide dietary education 17.  Hypokalemia  Potassium 3.9 on 9/19, plan to repeat labs on Friday  Supplemented increased on 9/16  18.  Hypoalbuminemia  Supplement initiated on 9/16 19.  Right> Left shoulder abd limitation with ant subluxation deformity on RIght side which appears chronic . Pt denies hx of shoulder surgery.  No pain with ROM, no xrays in Epic will order shoulder 2 V RIght   9/27- called Ortho about shoulder dislocation on R- will get CT and MRI- needs reverse shoulder surgery done eventually- can  WBAT and no limitation on fROM per Ortho  9/28- MRI and CT done for surgical planning- torn and retracted biceps tendon; retracted rotator cuff tear.  20. Cough  9/28- asked pt to use ICS q1 hour while awake.   9/29- sounds MUCH better- con't ICS while awake 21. Mild LE edema  9/29- will add TEDs when up.  22. Generalized weakness  9/30- feeling weak this AM- gave IVFs and might need U/A and Cx.   10/2- resolved- con't to monitor   LOS: 18 days A FACE TO FACE EVALUATION WAS PERFORMED  Logan [name] 01/21/2021, 10:31 AM

## 2021-01-21 NOTE — Progress Notes (Signed)
Occupational Therapy Session Note  Patient Details  Name: Logan [name] MRN: [id number] Date of Birth: 02-16-1946  Today's Date: 01/21/2021 OT Individual Time: 2549-8264 and 1583-0940 OT Individual Time Calculation (min): 54 min and 57 min   Short Term Goals: Week 1:  OT Short Term Goal 1 (Week 1): Pt will complete sit<>stand transfers using LRAD with CGA in preperation for dressing and toileting OT Short Term Goal 1 - Progress (Week 1): Met OT Short Term Goal 2 (Week 1): Pt will complete UB dressing with min assist OT Short Term Goal 2 - Progress (Week 1): Not met OT Short Term Goal 3 (Week 1): Pt will increased right shoulder scaption to 50 degrees for functional reaching tasks. OT Short Term Goal 3 - Progress (Week 1): Progressing toward goal OT Short Term Goal 4 (Week 1): Pt will donn shorts using reacher with min assist. OT Short Term Goal 4 - Progress (Week 1): Progressing toward goal Week 2:  OT Short Term Goal 1 (Week 2): Pt will adhere to cervical precautions consistently during ADL w/ no more than Min cues OT Short Term Goal 2 (Week 2): Pt will increase Right shoulder scaption to 50* to improve functional skill performance OT Short Term Goal 3 (Week 2): Pt will perform BSC/toilet transfers with LRAD and Supervision OT Short Term Goal 4 (Week 2): Pt will perform LB dress with AE PRN with Min A   Skilled Therapeutic Interventions/Progress Updates:    Session 1: Pt greeted at time of session sitting up in wheelchair, denied pain throughout session. Pt stating he was dehydrated last Friday but feels almost back to normal today. Focus of session on LB dressing with therapist donning TEDS dependent for time, attempting to use shoe horn for donning shoes but diffriculty sequencing and problem solving despite cues and demonstration, eventually needing Max A. Discussed home set up throughout, pt does have shower seat and performed TTB transfer with Min A overall with extended time. Pt having  difficulty getting feet over tub wall but insisting he can "do it himself." Min A for sit>stand from bench. Educated on importance of needing TTB instead of shower seat and pt somewhat receptive.  Back in room set up alarm on call bell in reach.  Session 2: Pt greeted at time of session sitting up in wheelchair agreeable to OT session, no pain throughout. Discussed DC date, pt open to potentially staying longer if needed and agreed by IDT. Son also planned to come on Thurs for family ed. Pt transport to gym and focused on GMC/FMC for LUE in standing at high low table for manipulating various blocks making different patterns, reaching for items. Sit > stand at high low table with Min A. Pt expressing he had an incontinent BM in standing, transported back to room and stand pivot to [facility] over toilet with RW CGA. Able to assist with doffing clothing and hygiene posteriorly in standing, needing Supervision to guide to locate BM as he was unable to feel, primarily using LUE. Min/Mod overall for toileting tasks for new brief and hygiene for thoroughness. Hand washing seated in w/c. Set up alarm on call bell in reach.   Therapy Documentation Precautions:  Precautions Precautions: Fall, Cervical Required Braces or Orthoses: Cervical Brace Cervical Brace: Hard collar, At all times, Other (comment) Restrictions Weight Bearing Restrictions: No     Therapy/Group: Individual Therapy  Viona Gilmore 01/21/2021, 7:09 AM

## 2021-01-21 NOTE — Progress Notes (Addendum)
Physical Therapy Session Note  Patient Details  Name: Logan [name] MRN: [id number] Date of Birth: 1945/08/09  Today's Date: 01/21/2021 PT Individual Time: 0930-1045 PT Individual Time Calculation (min): 75 min   Short Term Goals: Week 1:  PT Short Term Goal 1 (Week 1): Pt will perform supine to sit w/mod assist and LRAD PT Short Term Goal 1 - Progress (Week 1): Met PT Short Term Goal 2 (Week 1): Pt will transfer bed to/from wc w/mod assist and LRAD PT Short Term Goal 2 - Progress (Week 1): Met PT Short Term Goal 3 (Week 1): Pt will ambulate 29f w/min assist and LRAD PT Short Term Goal 3 - Progress (Week 1): Met  Skilled Therapeutic Interventions/Progress Updates:    Pt seated in w/c on arrival and agreeable to therapy. No complaint of pain on arrival, some "soreness" in shoulder with activity. Pt propelled w/c with BUE x ~150 ft with supervision for improved functional mobility. Pt performed supine<>sit x 2 in ADL apartment with mod A, reporting discomfort from  c collar. Gait x 130 ft with RW and supervision. Pt demoes hip hike on L side, VC for improved stride length and swing phase. Pt directed in hamstring curls x 10 and step overs 2 x 10 for improved step height. Discussed d/c plan, pt continues to insist that he will be alright alone, despite requiring as much as mod A for Sit to stand at times when fatigued and mod A for bed mobility. Pt would rather extend stay than have assistance at home. Pt returned to room and remained in w/c, was left with all needs in reach and alarm active.   Therapy Documentation Precautions:  Precautions Precautions: Fall, Cervical Required Braces or Orthoses: Cervical Brace Cervical Brace: Hard collar, At all times, Other (comment) Restrictions Weight Bearing Restrictions: No   Therapy/Group: Individual Therapy  OMickel Fuchs10/06/2020, 4:54 PM

## 2021-01-21 NOTE — Progress Notes (Signed)
Occupational Therapy Session Note  Patient Details  Name: Logan [name] MRN: [id number] Date of Birth: 04/30/1945  Today's Date: 01/21/2021 OT Individual Time: 1300-1327 OT Individual Time Calculation (min): 27 min   Short Term Goals: Week 2:  OT Short Term Goal 1 (Week 2): Pt will adhere to cervical precautions consistently during ADL w/ no more than Min cues OT Short Term Goal 2 (Week 2): Pt will increase Right shoulder scaption to 50* to improve functional skill performance OT Short Term Goal 3 (Week 2): Pt will perform BSC/toilet transfers with LRAD and Supervision OT Short Term Goal 4 (Week 2): Pt will perform LB dress with AE PRN with Min A  Skilled Therapeutic Interventions/Progress Updates:    Pt greeted in the w/c with no c/o pain. Agreeable to work on increasing functional independence with using the button hook for UB dressing. At this date, pt has a very tough time donning his shirt including fastening buttons due to visual restrictions from c-collar and also numbness in fingers. Positioned pt in front of a mirror to help compensate for deficits. Pt required increased time to meet task demands, minimal use of the mirror as pt is adamant that he just wants to see what he's doing. Tried both the standard button hook and also makeshift button hook fashioned by primary therapist, pt still needing Min A to fasten 1st buttons and vcs for accuracy. Vcs throughout session for neutral cervical alignment due to precautions. He remained sitting up at close of session, all needs within reach and safety belt fastened.   Therapy Documentation Precautions:  Precautions Precautions: Fall, Cervical Required Braces or Orthoses: Cervical Brace Cervical Brace: Hard collar, At all times, Other (comment) Restrictions Weight Bearing Restrictions: No Vital Signs: Therapy Vitals Temp: 97.8 F (36.6 C) Temp Source: Oral Pulse Rate: 67 Resp: 18 BP: (!) 108/56 Patient Position (if appropriate):  Sitting Oxygen Therapy SpO2: 98 % O2 Device: Room Air ADL: ADL Grooming: Dependent Where Assessed-Grooming: Sitting at sink Upper Body Bathing: Minimal assistance Where Assessed-Upper Body Bathing: Sitting at sink Lower Body Bathing: Maximal assistance Where Assessed-Lower Body Bathing: Sitting at sink, Standing at sink Upper Body Dressing: Maximal assistance Where Assessed-Upper Body Dressing: Sitting at sink Lower Body Dressing: Maximal assistance Where Assessed-Lower Body Dressing: Sitting at sink, Standing at sink    Therapy/Group: Individual Therapy  Kalven Ganim A [place] Youngblood 01/21/2021, 3:20 PM

## 2021-01-21 NOTE — Progress Notes (Signed)
Slept  good. PRN robitussin, tylenol and trazodone given at 2132. Refused scheduled miralax all weekend. Patient with urgency and frequency at times, also decreased dexterity to bilateral hands which causes him to be incontinent or spill his urinal. BLE edema. Using [place], congested, productive cough. Encouraged I.S. Foam dressings in place to bilateral knees. C-collar in place. Patrici Ranks A

## 2021-01-22 LAB — GLUCOSE, CAPILLARY
Glucose-Capillary: 153 mg/dL — ABNORMAL HIGH (ref 70–99)
Glucose-Capillary: 98 mg/dL (ref 70–99)
Glucose-Capillary: 125 mg/dL — ABNORMAL HIGH (ref 70–99)
Glucose-Capillary: 115 mg/dL — ABNORMAL HIGH (ref 70–99)

## 2021-01-22 MED ORDER — POLYETHYLENE GLYCOL 3350 17 G PO PACK: 17.0000 g | PACK | Freq: Every day | ORAL | Status: DC | PRN

## 2021-01-22 NOTE — Progress Notes (Signed)
Occupational Therapy Weekly Progress Note  Patient Details  Name: Logan [name] MRN: [id number] Date of Birth: 1945/09/04  Beginning of progress report period: January 14, 2021 End of progress report period: January 22, 2021  Today's Date: 01/22/2021 OT Individual Time: 4098-1191 OT Individual Time Calculation (min): 58 min    Patient has met 1 of 4 short term goals.  Pt has shown minimal gains toward OT goals for this reporting period. Pt continues to need Mod/Max overall for LB bathing/dressing, and UB bathing/dressing 2/2 ROM limitations in B shoulders, decreased strength, and decreased coordination in B hands 2/2 SCI. Focused OT sessions during this reporting period on toileting tasks as pt is supposed to go home Independently with sons checking in, pt has improved to Min/Mod with toileting tasks and plan to continue to focus on functional use of LUE for functional tasks. Pt has been limited using RUE 2/2 dislocation which will need future intervention. Upcoming family ed planned with son [name] later this week. Pt has also been limited this week 2/2 diarrhea and loose bowels.  Patient continues to demonstrate the following deficits: muscle weakness, decreased cardiorespiratoy endurance, impaired timing and sequencing, abnormal tone, unbalanced muscle activation, decreased coordination, and decreased motor planning, and decreased sitting balance, decreased standing balance, decreased postural control, decreased balance strategies, and difficulty maintaining precautions and therefore will continue to benefit from skilled OT intervention to enhance overall performance with BADL, iADL, and Reduce care partner burden.  Patient not progressing toward long term goals.  See goal revision..  Plan of care revisions: at Min/Mod level for bathing, dressing, and toileting.  OT Short Term Goals Week 2:  OT Short Term Goal 1 (Week 2): Pt will adhere to cervical precautions consistently during ADL w/ no more  than Min cues OT Short Term Goal 1 - Progress (Week 2): Met OT Short Term Goal 2 (Week 2): Pt will increase Right shoulder scaption to 50* to improve functional skill performance OT Short Term Goal 2 - Progress (Week 2): Not met OT Short Term Goal 3 (Week 2): Pt will perform BSC/toilet transfers with LRAD and Supervision OT Short Term Goal 3 - Progress (Week 2): Progressing toward goal OT Short Term Goal 4 (Week 2): Pt will perform LB dress with AE PRN with Min A OT Short Term Goal 4 - Progress (Week 2): Progressing toward goal Week 3:  OT Short Term Goal 1 (Week 3): STGs = LTGs at Min/Mod  Skilled Therapeutic Interventions/Progress Updates:    Pt greeted at time of session sitting up in wheelchair stating "I am feeling weak today" and notable affect change from previous sessions. No pain reported, but pt stating he has been having diarrhea and weakness. Pt with several questions regarding meds, helped make a list for next MD visit. Pt politely declining shower 2/2 weakness but wanting to shave, stand pivot > bed Min for power up and CGA for transfer. Sit > supine with poor form but Min A overall. Doffed front of collar for therapist to assist with shaving. While shaving, discussion regarding assist at home needed for ADLs, potentially staying with son, upcoming family ed, extending stay, etc. Replaced front collar pad and cleaned other. Pt needing to toilet, supine > sit Min A, stand pivot to wheelchair CGA, transferred to commode same manner. Note pt needing multiple anterior weight shifts to come up to standing prior to transfer. Clothing management CGA to doff, Min A for hygiene, and Max to don new brief, so overall Min/Mod A for  clothing management and hygiene. Set up in chair alarm on call bell in reach.    Therapy Documentation Precautions:  Precautions Precautions: Fall, Cervical Required Braces or Orthoses: Cervical Brace Cervical Brace: Hard collar, At all times, Other  (comment) Restrictions Weight Bearing Restrictions: No     Therapy/Group: Individual Therapy  Viona Gilmore 01/22/2021, 7:16 AM

## 2021-01-22 NOTE — Progress Notes (Signed)
Patient ID: Logan [name], male   DOB: 06-Feb-1946, 75 y.o.   MRN: [id number]  Met with pt to update him regarding team conference progress and extension of discharge date to 10/12 due to weakness and modifications to home in preparation of discharge. Still want his son to come in Thursday for education os he has tome to modify home. Pt feels better about extension and will work hard in therapies until then.

## 2021-01-22 NOTE — Progress Notes (Signed)
Pt reports a large BM right before 1900. No BM charted.

## 2021-01-22 NOTE — Progress Notes (Signed)
[place] PHYSICAL MEDICINE & REHABILITATION PROGRESS NOTE  Subjective/Complaints:   Pt has BP of 104/53- was 96/51 at 4am- going to hold Lasix and other BP meds per nursing.  Having more diarrhea- holding miralax- will change to prn.   Pt reports therapy wants him to stay longer.   ROS:   Pt denies SOB, abd pain, CP, N/V/C/D- (+) loose stools; , and vision changes  Objective: Vital Signs: Blood pressure (!) 104/53, pulse 70, temperature 97.7 F (36.5 C), temperature source Oral, resp. rate 19, height 5\' 7"  (1.702 m), weight 86.7 kg, SpO2 99 %. No results found. Recent Labs    01/21/21 0529  WBC 8.0  HGB 12.0*  HCT 35.9*  PLT 260    Recent Labs    01/21/21 0529  NA 140  K 3.9  CL 105  CO2 28  GLUCOSE 90  BUN 28*  CREATININE 0.87  CALCIUM 8.8*     Intake/Output Summary (Last 24 hours) at 01/22/2021 6789 Last data filed at 01/22/2021 0813 Gross per 24 hour  Intake 960 ml  Output 1100 ml  Net -140 ml        Physical Exam: BP (!) 104/53 (BP Location: Left Arm)   Pulse 70   Temp 97.7 F (36.5 C) (Oral)   Resp 19   Ht 5\' 7"  (1.702 m)   Wt 86.7 kg   SpO2 99%   BMI 29.94 kg/m         General: awake, alert, appropriate, sitting up in bedside chair; nursing in room; NAD HENT: conjugate gaze; oropharynx moist CV: regular rate; no JVD Pulmonary: CTA B/L; no W/R/R- good air movement GI: soft, NT, ND, (+)BS- hyperactive Psychiatric: appropriate; interactive Neurological: Ox3 Sensation intact to LT in BUE Fine motor able to oppose finger to thumb in BUEs Unable to lift RUE against gravity- has what appears to be R shoulder anteriorly dislocated on exam- is chronic-no change Motor: Bilateral upper extremities: Shoulder abduction 2/5, elbow flexion3+ /extension 2+/5, handgrip 4/5, right weaker than left, stable Bilateral lower extremities: 4-/5 proximal distal Extremities: 1+ LE edema to lower calves B/L   Assessment/Plan: 1. Functional deficits  which require 3+ hours per day of interdisciplinary therapy in a comprehensive inpatient rehab setting. Physiatrist is providing close team supervision and 24 hour management of active medical problems listed below. Physiatrist and rehab team continue to assess barriers to discharge/monitor patient progress toward functional and medical goals   Care Tool:  Bathing    Body parts bathed by patient: Right arm, Left arm, Chest, Abdomen, Front perineal area, Right upper leg, Left upper leg   Body parts bathed by helper: Face, Buttocks, Left lower leg, Right lower leg     Bathing assist Assist Level: Moderate Assistance - Patient 50 - 74%     Upper Body Dressing/Undressing Upper body dressing   What is the patient wearing?: Button up shirt    Upper body assist Assist Level: Maximal Assistance - Patient 25 - 49%    Lower Body Dressing/Undressing Lower body dressing      What is the patient wearing?: Pants, Incontinence brief     Lower body assist Assist for lower body dressing: Maximal Assistance - Patient 25 - 49%     Toileting Toileting    Toileting assist Assist for toileting: Moderate Assistance - Patient 50 - 74%     Transfers Chair/bed transfer  Transfers assist     Chair/bed transfer assist level: Contact Guard/Touching assist Chair/bed transfer assistive device: Gilford Rile  Locomotion Ambulation   Ambulation assist      Assist level: Contact Guard/Touching assist Assistive device: Walker-rolling Max distance: 125'   Walk 10 feet activity   Assist     Assist level: Contact Guard/Touching assist Assistive device: Walker-rolling   Walk 50 feet activity   Assist Walk 50 feet with 2 turns activity did not occur: Safety/medical concerns  Assist level: Contact Guard/Touching assist Assistive device: Walker-rolling    Walk 150 feet activity   Assist Walk 150 feet activity did not occur: Safety/medical concerns         Walk 10 feet on uneven  surface  activity   Assist Walk 10 feet on uneven surfaces activity did not occur: Safety/medical concerns         Wheelchair     Assist Is the patient using a wheelchair?: Yes Type of Wheelchair: Manual    Wheelchair assist level: Supervision/Verbal cueing Max wheelchair distance: 136 ft    Wheelchair 50 feet with 2 turns activity    Assist        Assist Level: Supervision/Verbal cueing   Wheelchair 150 feet activity     Assist  Wheelchair 150 feet activity did not occur: Safety/medical concerns   Assist Level: Total Assistance - Patient < 25%    Medical Problem List and Plan: 1.  Cervical myelopathy- incopletel quadriplegia-  s/p C3-4 ACDF on 01/01/21  Con't PT and OT/CIR_ WBAT on RUE- for shoulder dislocation- will see if needs to stay a few days longer?- will d/w team in team conference 2.  Impaired mobility:  -DVT/anticoagulation:  Pharmaceutical: Heparin--now d/ced Was on Eliquis PTA--held due to surgery--> resumed on 9/25             -antiplatelet therapy: N/A 3. [facility] OA left hip/Pain Management: Oxycodone prn             Continue voltaren gel to left hip QID             Xray showing left hip OA  9/28- pt needs R shoulder reverse shoulder arthroplasty- MRI and CT done- con't pain regimen for now.   9/29- also needs L hip replacement long term  10/4- pain controlled- con't regimen Monitor with increased exertion 4. Mood: LCSW to follow for evaluation and support.              -antipsychotic agents: N/A 5. Neuropsych: This patient is capable of making decisions on his own behalf. 6. Bilateral knee abraisons/cervical insicion:  Routine pressure relief measures--monitor incision for healing.              --D/ced bacitracin ointement Continue Aquacell with dry dressing to bilateral knee abrasions.  7. Fluids/Electrolytes/Nutrition: Monitor I/Os.  8. CAF: Monitor HR TID--continue to hold eliquis till 9/23             --On coreg BID.   9/27-  change to daily weights  9/28- weight stable to lower- con't regimen  10/2- weight up slightly, but back to baseline- con't to monitor  10/5- Weight up to 85.9 kg- will recheck in AM and if still rising, will need to go from there.   10/6- weight up- Lasix was held- will make sure nursing gives- since weight up so much.  Filed Weights   01/20/21 0448 01/21/21 0731 01/22/21 0353  Weight: 82.1 kg 85.9 kg 86.7 kg  9.  Dehydration: Encourage fluid intake. Monitor orthostatic symptoms  9/30- Pt's BUN up to 26- will give IVFs x 1 day 75cc/hour- and  push fluids- c/o being weak- might need U/A and Cx if has fever?  10/3- BUN up to 28- is more dehydrated, but feeling OK- will monitor and give more IVFs if needed- can d/c IV for now.  10. Hypoxia/ Productive cough: Will down grade diet to dysphagia 3 with aspiration precautions/supervison             --has been cleared to remove collar for meals.              --CXR showing atelectasis  Wean off supplemental oxygen  10/3- off O2 11.  Prediabetes diet controlled?: Has been off meds for 5 years.  Hemoglobin A1c 5.7 on 9/16 Relatively controlled on 9/21 Monitor with increased mobility 12. HTN: Monitor BP TID--continue Lasix and Cozaar. BP currently soft but as expected for SCI Vitals:   01/22/21 0353 01/22/21 0739  BP: (!) 96/51 (!) 104/53  Pulse: 63 70  Resp: 19   Temp: 97.7 F (36.5 C)   SpO2: 96% 99%  Controlled monitor for orthostatic changes  10/4- BP low- held BP meds due to soft BP.  13.  Macrocytic anemia:   Folate within normal limits on 9/16 Hemoglobin 11.5 on 9/19 14. OSA: Does not use CPAP. 15. Constipation now with loose stools: Has not had BM for a couple of days             Added Senna S.   MiraLAX started on 9/16  10/3- was having loose stools, but doing better- will monitor  10/4- will stop Miralax and make prn 16. Overweight BMI 28.82: provide dietary education 17.  Hypokalemia  Potassium 3.9 on 9/19, plan to repeat labs  on Friday  Supplemented increased on 9/16  18.  Hypoalbuminemia  Supplement initiated on 9/16 19.  Right> Left shoulder abd limitation with ant subluxation deformity on RIght side which appears chronic . Pt denies hx of shoulder surgery.  No pain with ROM, no xrays in Epic will order shoulder 2 V RIght   9/27- called Ortho about shoulder dislocation on R- will get CT and MRI- needs reverse shoulder surgery done eventually- can WBAT and no limitation on fROM per Ortho  9/28- MRI and CT done for surgical planning- torn and retracted biceps tendon; retracted rotator cuff tear.  20. Cough  9/28- asked pt to use ICS q1 hour while awake.   9/29- sounds MUCH better- con't ICS while awake 21. Mild LE edema  9/29- will add TEDs when up.  22. Generalized weakness  9/30- feeling weak this AM- gave IVFs and might need U/A and Cx.   10/2- resolved- con't to monitor   LOS: 19 days A FACE TO [place] 01/22/2021, 9:03 AM

## 2021-01-22 NOTE — Progress Notes (Signed)
Physical Therapy Weekly Progress Note  Patient Details  Name: Logan [name] MRN: [id number] Date of Birth: November 22, 1945  Beginning of progress report period: January 15, 2021 End of progress report period: January 22, 2021  Patient has met 1 of 1 short term goals.  Pt's LOS extended d/t mod I goals and limited progress. Pt requires min-CGA for Sit to stand, with as much as mod A when fatigued or dehydrated. Mod A bed mobility for BLE management. Gait up to 130 ft with RW and supervision.   Patient continues to demonstrate the following deficits muscle weakness, decreased cardiorespiratoy endurance, and decreased postural control, decreased balance strategies, and difficulty maintaining precautions and therefore will continue to benefit from skilled PT intervention to increase functional independence with mobility.  Patient progressing toward long term goals..  Continue plan of care.  PT Short Term Goals Week 1:  PT Short Term Goal 1 (Week 1): Pt will perform supine to sit w/mod assist and LRAD PT Short Term Goal 1 - Progress (Week 1): Met PT Short Term Goal 2 (Week 1): Pt will transfer bed to/from wc w/mod assist and LRAD PT Short Term Goal 2 - Progress (Week 1): Met PT Short Term Goal 3 (Week 1): Pt will ambulate 65f w/min assist and LRAD PT Short Term Goal 3 - Progress (Week 1): Met Week 2:  PT Short Term Goal 1 (Week 2): =LTGs d/t ELOS PT Short Term Goal 1 - Progress (Week 2): Met Week 3:  PT Short Term Goal 1 (Week 3): =LTGs d/t ELOS  Skilled Therapeutic Interventions/Progress Updates:  Ambulation/gait training;Balance/vestibular training;Disease management/prevention;Functional mobility training;DME/adaptive equipment instruction;Neuromuscular re-education;Patient/family education;Psychosocial support;Skin care/wound management;Splinting/orthotics;Therapeutic Activities;Therapeutic Exercise;UE/LE Strength taining/ROM;UE/LE Coordination activities;Wheelchair propulsion/positioning    Therapy Documentation Precautions:  Precautions Precautions: Fall, Cervical Required Braces or Orthoses: Cervical Brace Cervical Brace: Hard collar, At all times, Other (comment) Restrictions Weight Bearing Restrictions: No    Therapy/Group: Individual Therapy  OMickel Fuchs10/07/2020, 11:54 AM

## 2021-01-22 NOTE — Patient Care Conference (Signed)
Inpatient RehabilitationTeam Conference and Plan of Care Update Date: 01/22/2021   Time: 11:39 AM    Patient Name: Logan [name]      Medical Record Number: [id number]  Date of Birth: 1945-10-05 Sex: Male         Room/Bed: 4W07C/4W07C-01 Payor Info: Payor: [employer] MEDICARE / Plan: [place] HMO / Product Type: *No Product type* /    Admit Date/Time:  01/03/2021  4:40 PM  Primary Diagnosis:  Cervical disc disease with myelopathy  Hospital Problems: Principal Problem:   Cervical disc disease with myelopathy Active Problems:   Hypoxia   Supplemental oxygen dependent   Hypoalbuminemia due to protein-calorie malnutrition ([place])   Hypokalemia   Macrocytic anemia   Prediabetes    Expected Discharge Date: Expected Discharge Date: 01/30/21  Team Members Present: Physician leading conference: Dr. Courtney Heys Social Worker Present: Ovidio Kin, LCSW Nurse Present: Dorthula Nettles, RN PT Present: Ailene Rud, PT OT Present: Lillia Corporal, OT PPS Coordinator present : Gunnar Fusi, SLP     Current Status/Progress Goal Weekly Team Focus  Bowel/Bladder   Pt continent of B/B. LBM 01/21/2021 with frequent loose BMs improving  regular BMs every 3 days or less  Assess B/B every shift and PRN   Swallow/Nutrition/ Hydration             ADL's   CGA/Min for sit > stands, CGA stand pivot, LB bathing Mod A, LB dress Mod/Max (help with brief), UB dress Max, diarrhea/bowels limiting, working on LUE use  downgraded ADLs to Min/Mod  ADL retraining, AE for ADLs, compensatory techniques, standing balance, LUE training, RUE ROM as able/use, toileting!!   Mobility   CGA-min STS, occ mod when fatigued, gait up to 130 ft with RW and supervision, bed mobility with mod A for LE management  mod I overall  LE strength, gait, endurance   Communication             Safety/Cognition/ Behavioral Observations            Pain   Currently denies pain  pain scale of <3/10  Assess pain every shift and  PRN   Skin   abrasians to BL knees  no skin breakdown.  Assess skin every shift and PRN     Discharge Planning:  Son coming in for education Thursday, at times pt will be alone, needs to be mod/i if possible at least wheelchair level   Team Discussion: Has soft BP. Order to give Lasix due to weight being up. Cough improving with Incentive Spirometer. Adjusting medications. Continent B/B, LBM 01/21/2021. Reports 5/10 pain. Tylenol and Voltaren gel available. Trazodone for sleep. Lacerations to bilateral knees still requires Aquacel dressings. Need to continue education on proper neck brace usage. Refusing insulin, doesn't take it at home. Refusing bowel medications. Significant ADL needs. Toileting hygiene, can reach from both sides but still needs supervision for proper cleaning. Needs a bidet. Can take collar off to put on shirt per MD. Gait up to  130 ft. Bed mobility still requires assist to get both legs up on bed.  Patient on target to meet rehab goals: yes  *See Care Plan and progress notes for long and short-term goals.   Revisions to Treatment Plan:  MD adjusting medications, extended stay a few more days.  Teaching Needs: Family education, medication management, pain management, diabetes management, skin/wound care, safety awareness, transfer training, gait training, balance training, endurance training.  Current Barriers to Discharge: Decreased caregiver support, Medical stability, Home enviroment access/layout,  New diabetic, Neurogenic bowel and bladder, Wound care, Lack of/limited family support, Insurance for SNF coverage, Weight, Weight bearing restrictions, and Medication compliance  Possible Resolutions to Barriers: Medication needs addressed, bowel management addressed, toileting hygiene needs addressed, diabetes education on-going, wound care needs addressed.     Medical Summary Current Status: R shoulder anterior dislocation; LBM 10/3- incontinent- pain 5/10- uses  tylenol and voltaren gel for pain; knees need aquacel still for wound care; refuses insulin; having diarrhea-  Barriers to Discharge: Decreased family/caregiver support;Home enviroment access/layout;Medical stability;Neurogenic Bowel & Bladder;Weight;Weight bearing restrictions;Wound care;Other (comments);New diabetic;Insurance for SNF coverage  Barriers to Discharge Comments: family education this week; will be home by self sometimes; pt is NOT going to be at mod I;  sounds like refusing bowel program and insulin; - Possible Resolutions to [employer] Focus: made miralax prn- and senna prn; still having diaarhea; best scenario for toileting is Supervision- cannot feel to wipe well; cannot get brief up/down himself; mod-max with LB dressing; collar limits severely; CGA- mod A sit-stand; walks max 148ft RW; bed mobility mod A; canot go to SNF- 10/12 Wednesday   Continued Need for Acute Rehabilitation Level of Care: The patient requires daily medical management by a physician with specialized training in physical medicine and rehabilitation for the following reasons: Direction of a multidisciplinary physical rehabilitation program to maximize functional independence : Yes Medical management of patient stability for increased activity during participation in an intensive rehabilitation regime.: Yes Analysis of laboratory values and/or radiology reports with any subsequent need for medication adjustment and/or medical intervention. : Yes   I attest that I was present, lead the team conference, and concur with the assessment and plan of the team.   Cristi Loron 01/22/2021, 6:28 PM

## 2021-01-22 NOTE — Progress Notes (Signed)
Physical Therapy Session Note  Patient Details  Name: Logan [name] MRN: [id number] Date of Birth: 10/16/1945  Today's Date: 01/22/2021 PT Individual Time: 0800-0853, 5945-8592 PT Individual Time Calculation (min): 53 min, 45 min PT missed time: 15 min  Short Term Goals: Week 1:  PT Short Term Goal 1 (Week 1): Pt will perform supine to sit w/mod assist and LRAD PT Short Term Goal 1 - Progress (Week 1): Met PT Short Term Goal 2 (Week 1): Pt will transfer bed to/from wc w/mod assist and LRAD PT Short Term Goal 2 - Progress (Week 1): Met PT Short Term Goal 3 (Week 1): Pt will ambulate 65f w/min assist and LRAD PT Short Term Goal 3 - Progress (Week 1): Met Week 2:  PT Short Term Goal 1 (Week 2): =LTGs d/t ELOS  Skilled Therapeutic Interventions/Progress Updates:    Session 1: Pt seated in w/c on arrival and agreeable to therapy. No complaint of pain. Pt reports that he needs to have a BM. ambulatory transfer to bathroom, min A for clothing management, tot A for hygiene. Pt required extended time and had continent BM. Pt transported to therapy gym for time management and energy conservation. min A for Sit to stand, Stand pivot transfer with CGA with RW to nu step. Nustep 3 x 5 min with 3 min rest break. Pt reports that he feels "weak" and "dehydrated" again, requiring extended rest breaks. Pt provided with water during session and encouraged to increase intake. Pt reported he thinks he is still receiving miralax, saying nursing told him it was  protein but he thinks he is still getting it. Pt reported RPE of 17, incr from previous sessions with same activity. Pt refused gait training d/t fatigue, which is uncharacteristic for this pt. Pt returned to room and remained in w/c, was left with all needs in reach and alarm active.   Session 2: Pt seated in w/c on arrival and agreeable to therapy. No complaint of pain. Pt reported that he felt very weak this PM, but agreeable to go outside. Pt transported  to outdoor environment  for time management and energy conservation, and holistic stress management benefit of being outdoors. Gait 2 x 20 ft with RW and supervision to [place] Pt demoed decr step length and height from previous sessions, mild improvement with repetition and VC. Mod A Sit to stand throughout. During rest break, discussed options if pt continues to require assist at d/c. Pt did not appear receptive, but verbally agreed to talk to his son about the possibility of staying with him. During second bout of gait training, pt reported episode of incontinence. Returned to room for brief change. ambulatory transfer to bathroom with RW and supervision. Pt requested to attempt continent void, and required extended time. Small continent void and small incontinent bowel void. Min A clothing management and tot A hygiene. Pt returned to w/c and remained at end of session. Pt stated he "didn't have anything left" after toileting. Pt remained in w/c and was left with all needs in reach and alarm active. Pt missed 15 minutes of scheduled PT d/t fatigue.   Therapy Documentation Precautions:  Precautions Precautions: Fall, Cervical Required Braces or Orthoses: Cervical Brace Cervical Brace: Hard collar, At all times, Other (comment) Restrictions Weight Bearing Restrictions: No    Therapy/Group: Individual Therapy  OMickel Fuchs10/07/2020, 8:57 AM

## 2021-01-22 NOTE — Progress Notes (Addendum)
Physical Therapy Session Note  Patient Details  Name: Logan [name] MRN: [id number] Date of Birth: 11-Oct-1945  Today's Date: 01/22/2021 PT Individual Time: 1135-1202 PT Individual Time Calculation (min): 27 min   Short Term Goals: Week 1:  PT Short Term Goal 1 (Week 1): Pt will perform supine to sit w/mod assist and LRAD PT Short Term Goal 1 - Progress (Week 1): Met PT Short Term Goal 2 (Week 1): Pt will transfer bed to/from wc w/mod assist and LRAD PT Short Term Goal 2 - Progress (Week 1): Met PT Short Term Goal 3 (Week 1): Pt will ambulate 14f w/min assist and LRAD PT Short Term Goal 3 - Progress (Week 1): Met Week 2:  PT Short Term Goal 1 (Week 2): =LTGs d/t ELOS PT Short Term Goal 1 - Progress (Week 2): Met  Skilled Therapeutic Interventions/Progress Updates:  Pt seated in wc.  Cervical collar already donned.  Pt denied pain, but is concerned about diarrhea if he leaves room.  PT assured him that he could do therapy  close to his room. PT donned bil TEDS;pt doffed and donned shoes.  Transfer training including reciprocal scooting to move forward on wc, place feet apart and under him, and flex trunk forward to bring COG over feet, one hand on armrest, one hand on RW.  Mod assist required.  Gait training on level tile in room, 15'; RW, min/CGA and cues for upright trunk. Pt initially unsteady on LLE (" my L hip is acting up") requiring min assist.  neuromuscular re-education via demo and multmodal cues for standing: calf raises, toe raises, with bil UE support on foot board of bed.  Pt needed seated rest break after 10 reps calf raises, and needed to use toilet after 5 reps toe raises.   Toilet transfer using wall bar, emergently, min assist.  PT managed clothes.  At end of session, pt sitting on toilet.  NT voiced that she would be with pt momentarily.  Pt stated that he would not get up without staff.   Therapy Documentation Precautions:  Precautions Precautions: Fall,  Cervical Required Braces or Orthoses: Cervical Brace Cervical Brace: Hard collar, At all times, Other (comment) Restrictions Weight Bearing Restrictions: No      Therapy/Group: Individual Therapy  Haleemah Buckalew 01/22/2021, 12:21 PM

## 2021-01-23 LAB — GLUCOSE, CAPILLARY
Glucose-Capillary: 95 mg/dL (ref 70–99)
Glucose-Capillary: 129 mg/dL — ABNORMAL HIGH (ref 70–99)
Glucose-Capillary: 106 mg/dL — ABNORMAL HIGH (ref 70–99)

## 2021-01-23 MED ORDER — COLLAGENASE 250 UNIT/GM EX OINT
TOPICAL_OINTMENT | Freq: Every day | CUTANEOUS | Status: DC
  Filled 2021-01-23: qty 30

## 2021-01-23 NOTE — Progress Notes (Signed)
Occupational Therapy Session Note  Patient Details  Name: Logan [name] MRN: [id number] Date of Birth: 01/21/1946  Today's Date: 01/23/2021 OT Individual Time: 2482-5003 OT Individual Time Calculation (min): 54 min    Short Term Goals: Week 2:  OT Short Term Goal 1 (Week 2): Pt will adhere to cervical precautions consistently during ADL w/ no more than Min cues OT Short Term Goal 1 - Progress (Week 2): Met OT Short Term Goal 2 (Week 2): Pt will increase Right shoulder scaption to 50* to improve functional skill performance OT Short Term Goal 2 - Progress (Week 2): Not met OT Short Term Goal 3 (Week 2): Pt will perform BSC/toilet transfers with LRAD and Supervision OT Short Term Goal 3 - Progress (Week 2): Progressing toward goal OT Short Term Goal 4 (Week 2): Pt will perform LB dress with AE PRN with Min A OT Short Term Goal 4 - Progress (Week 2): Progressing toward goal Week 3:  OT Short Term Goal 1 (Week 3): STGs = LTGs at Min/Mod  Skilled Therapeutic Interventions/Progress Updates:    Pt greeted at time of session sitting up in wheelchair finishing up breakfast. No pain. Pt demonstrated he is able to utilize LUE to reach larger items for gross grasp and fork as well to eat eggs/smaller items. Provided built up handle for later on if needed. After finishing breakfast, needing to toilet and pt with no brief on. Transported to bathroom via wheelchair and stand pivot wheelchair > toilet Min A for power up and CGA for transfer. Min A overall for toileting tasks with extended time and frequent cues for problem solving. Donned new brief with assist and pt able to get over hips with Min A posteriorly. Cues to make sure shorts are pulled up to knees prior to standing. Able to perform hygiene CGA today which is an improvement. Pt seated in wheelchair at mirror for hand washing with assist to reach soap and paper towels. Practice with doffing/donning neck collar as he will be home alone during the day and  will need to remove for eating. Pt with difficulty grasping collar straps with L and R hand, trialed both and reaching to contralateral side as well. Pt giving max effort but eventually needing assist to perform. Up in chair alarm on call bell in reach.    Therapy Documentation Precautions:  Precautions Precautions: Fall, Cervical Required Braces or Orthoses: Cervical Brace Cervical Brace: Hard collar, At all times, Other (comment) Restrictions Weight Bearing Restrictions: No    Therapy/Group: Individual Therapy  Viona Gilmore 01/23/2021, 8:34 AM

## 2021-01-23 NOTE — Progress Notes (Signed)
[facility] PHYSICAL MEDICINE & REHABILITATION PROGRESS NOTE  Subjective/Complaints:  Pt has been refusing bowel program with suppository- thought is was causing him to have loose stools- explained it will help him have fewer bowel accidents.   Coughing some- using ICS a lot per pt.   Per staff, pt concerned about insulin- didn't take at home- will stop since BG's looking good overall.    ROS:   Pt denies SOB, abd pain, CP, N/V/C/D, and vision changes  Objective: Vital Signs: Blood pressure (!) 115/58, pulse 65, temperature 97.8 F (36.6 C), temperature source Oral, resp. rate 17, height 5\' 7"  (1.702 m), weight 85.6 kg, SpO2 96 %. No results found. Recent Labs    01/21/21 0529  WBC 8.0  HGB 12.0*  HCT 35.9*  PLT 260    Recent Labs    01/21/21 0529  NA 140  K 3.9  CL 105  CO2 28  GLUCOSE 90  BUN 28*  CREATININE 0.87  CALCIUM 8.8*     Intake/Output Summary (Last 24 hours) at 01/23/2021 1447 Last data filed at 01/23/2021 1428 Gross per 24 hour  Intake 960 ml  Output 1275 ml  Net -315 ml        Physical Exam: BP (!) 115/58 (BP Location: Right Arm)   Pulse 65   Temp 97.8 F (36.6 C) (Oral)   Resp 17   Ht 5\' 7"  (1.702 m)   Wt 85.6 kg   SpO2 96%   BMI 29.56 kg/m          General: awake, alert, appropriate, sitting up in bed; sleepy- but woke to stimuli; wearing cervical collar; NAD HENT: conjugate gaze; oropharynx moist CV: regular rate; no JVD Pulmonary: CTA B/L; no W/R/R- good air movement- slightly coarse, but otherwise sounds good.  GI: soft, NT, ND, (+)BS Psychiatric: appropriate Neurological: Ox3 MS: R anterior shoulder dislocation- chronic Sensation intact to LT in BUE Fine motor able to oppose finger to thumb in BUEs Unable to lift RUE against gravity- has what appears to be R shoulder anteriorly dislocated on exam- is chronic-no change Motor: Bilateral upper extremities: Shoulder abduction 2/5, elbow flexion3+ /extension 2+/5,  handgrip 4/5, right weaker than left, stable Bilateral lower extremities: 4-/5 proximal distal Extremities: 1+ LE edema to lower calves B/L   Assessment/Plan: 1. Functional deficits which require 3+ hours per day of interdisciplinary therapy in a comprehensive inpatient rehab setting. Physiatrist is providing close team supervision and 24 hour management of active medical problems listed below. Physiatrist and rehab team continue to assess barriers to discharge/monitor patient progress toward functional and medical goals   Care Tool:  Bathing    Body parts bathed by patient: Right arm, Left arm, Chest, Abdomen, Front perineal area, Right upper leg, Left upper leg   Body parts bathed by helper: Face, Buttocks, Left lower leg, Right lower leg     Bathing assist Assist Level: Moderate Assistance - Patient 50 - 74%     Upper Body Dressing/Undressing Upper body dressing   What is the patient wearing?: Button up shirt    Upper body assist Assist Level: Maximal Assistance - Patient 25 - 49%    Lower Body Dressing/Undressing Lower body dressing      What is the patient wearing?: Pants, Incontinence brief     Lower body assist Assist for lower body dressing: Maximal Assistance - Patient 25 - 49%     Toileting Toileting    Toileting assist Assist for toileting: Minimal Assistance - Patient >  75%     Transfers Chair/bed transfer  Transfers assist     Chair/bed transfer assist level: Contact Guard/Touching assist Chair/bed transfer assistive device: Programmer, multimedia   Ambulation assist      Assist level: Supervision/Verbal cueing Assistive device: Walker-rolling Max distance: 20   Walk 10 feet activity   Assist     Assist level: Supervision/Verbal cueing Assistive device: Walker-rolling   Walk 50 feet activity   Assist Walk 50 feet with 2 turns activity did not occur: Safety/medical concerns  Assist level: Contact Guard/Touching  assist Assistive device: Walker-rolling    Walk 150 feet activity   Assist Walk 150 feet activity did not occur: Safety/medical concerns         Walk 10 feet on uneven surface  activity   Assist Walk 10 feet on uneven surfaces activity did not occur: Safety/medical concerns         Wheelchair     Assist Is the patient using a wheelchair?: Yes Type of Wheelchair: Manual    Wheelchair assist level: Dependent - Patient 0% Max wheelchair distance: 136 ft    Wheelchair 50 feet with 2 turns activity    Assist        Assist Level: Dependent - Patient 0%   Wheelchair 150 feet activity     Assist  Wheelchair 150 feet activity did not occur: Safety/medical concerns   Assist Level: Dependent - Patient 0%    Medical Problem List and Plan: 1.  Cervical myelopathy- incopletel quadriplegia-  s/p C3-4 ACDF on 01/01/21  Con't PT and OT/CIR_ WBAT on RUE- for shoulder dislocation-   10/5- con't PT and OT- change d/c to 01/30/21 so can go home closer to mod I- family cannot provide more care. Needs bidet! 2.  Impaired mobility:  -DVT/anticoagulation:  Pharmaceutical: Heparin--now d/ced Was on Eliquis PTA--held due to surgery--> resumed on 9/25             -antiplatelet therapy: N/A 3. Endstate OA left hip/Pain Management: Oxycodone prn             Continue voltaren gel to left hip QID             Xray showing left hip OA  9/28- pt needs R shoulder reverse shoulder arthroplasty- MRI and CT done- con't pain regimen for now.   9/29- also needs L hip replacement long term  10/4- pain controlled- con't regimen Monitor with increased exertion 4. Mood: LCSW to follow for evaluation and support.              -antipsychotic agents: N/A 5. Neuropsych: This patient is capable of making decisions on his own behalf. 6. Bilateral knee abraisons/cervical insicion:  Routine pressure relief measures--monitor incision for healing.              --D/ced bacitracin  ointement Continue Aquacell with dry dressing to bilateral knee abrasions.  7. Fluids/Electrolytes/Nutrition: Monitor I/Os.  8. CAF: Monitor HR TID--continue to hold eliquis till 9/23             --On coreg BID.   9/27- change to daily weights  9/28- weight stable to lower- con't regimen  10/2- weight up slightly, but back to baseline- con't to monitor  10/5- Weight up to 85.9 kg- will recheck in AM and if still rising, will need to go from there.   10/4- weight up- Lasix was held- will make sure nursing gives- since weight up so much.   10/5- weight back  down 1 kg- will con't Lasix- but change parameters to hold if <100/60.  Filed Weights   01/21/21 0731 01/22/21 0353 01/23/21 0418  Weight: 85.9 kg 86.7 kg 85.6 kg  9.  Dehydration: Encourage fluid intake. Monitor orthostatic symptoms  9/30- Pt's BUN up to 26- will give IVFs x 1 day 75cc/hour- and push fluids- c/o being weak- might need U/A and Cx if has fever?  10/3- BUN up to 28- is more dehydrated, but feeling OK- will monitor and give more IVFs if needed- can d/c IV for now.  10. Hypoxia/ Productive cough: Will down grade diet to dysphagia 3 with aspiration precautions/supervison             --has been cleared to remove collar for meals.              --CXR showing atelectasis  Wean off supplemental oxygen  10/3- off O2  10/5- Con't ICS and push usage- if doesn't improve, will check CXR.  11.  Prediabetes diet controlled?: Has been off meds for 5 years.  Hemoglobin A1c 5.7 on 9/16 Relatively controlled on 9/21 10/5- will stop Insulin and change CBGs to BID before meals.  Monitor with increased mobility 12. HTN: Monitor BP TID--continue Lasix and Cozaar. BP currently soft but as expected for SCI Vitals:   01/23/21 0819 01/23/21 1304  BP: 135/64 (!) 115/58  Pulse: 64 65  Resp:  17  Temp:  97.8 F (36.6 C)  SpO2: 96%   Controlled monitor for orthostatic changes  10/4- BP low- held BP meds due to soft BP.  13.  Macrocytic  anemia:   Folate within normal limits on 9/16 Hemoglobin 11.5 on 9/19 14. OSA: Does not use CPAP. 15. Constipation now with loose stools: Has not had BM for a couple of days             Added Senna S.   MiraLAX started on 9/16  10/3- was having loose stools, but doing better- will monitor  10/4- will stop Miralax and make prn 16. Overweight BMI 28.82: provide dietary education 17.  Hypokalemia  Potassium 3.9 on 9/19, plan to repeat labs on Friday  Supplemented increased on 9/16  18.  Hypoalbuminemia  Supplement initiated on 9/16 19.  Right> Left shoulder abd limitation with ant subluxation deformity on RIght side which appears chronic . Pt denies hx of shoulder surgery.  No pain with ROM, no xrays in Epic will order shoulder 2 V RIght   9/27- called Ortho about shoulder dislocation on R- will get CT and MRI- needs reverse shoulder surgery done eventually- can WBAT and no limitation on fROM per Ortho  9/28- MRI and CT done for surgical planning- torn and retracted biceps tendon; retracted rotator cuff tear.  20. Cough  9/28- asked pt to use ICS q1 hour while awake.   9/29- sounds MUCH better- con't ICS while awake  10/5- encourage ICS usage-  21. Mild LE edema  9/29- will add TEDs when up.  22. Generalized weakness  9/30- feeling weak this AM- gave IVFs and might need U/A and Cx.   10/2- resolved- con't to monitor   LOS: 20 days A FACE TO FACE EVALUATION WAS PERFORMED  Logan [name] 01/23/2021, 2:47 PM

## 2021-01-23 NOTE — Progress Notes (Signed)
Physical Therapy Session Note  Patient Details  Name: Logan [name] MRN: [id number] Date of Birth: 11-04-1945  Today's Date: 01/23/2021 PT Individual Time: 0955-1050 PT Individual Time Calculation (min): 55 min   Short Term Goals: Week 1:  PT Short Term Goal 1 (Week 1): Pt will perform supine to sit w/mod assist and LRAD PT Short Term Goal 1 - Progress (Week 1): Met PT Short Term Goal 2 (Week 1): Pt will transfer bed to/from wc w/mod assist and LRAD PT Short Term Goal 2 - Progress (Week 1): Met PT Short Term Goal 3 (Week 1): Pt will ambulate 66f w/min assist and LRAD PT Short Term Goal 3 - Progress (Week 1): Met Week 2:  PT Short Term Goal 1 (Week 2): =LTGs d/t ELOS PT Short Term Goal 1 - Progress (Week 2): Met Week 3:  PT Short Term Goal 1 (Week 3): =LTGs d/t ELOS  Skilled Therapeutic Interventions/Progress Updates:    Pt seated in w/c on arrival and agreeable to therapy. Pt reports 2/10 pain in his shoulder at rest, mild hip pain with activity, rest breaks as needed.   Gait training: X 90 ft, 2 x 100 with RW and close supervision. Cues for incr step length on R foot and upright posture with min improvement after max VC. Pt demoed incr fatigue with each bout, requiring extended rest break.   Pt performed the following exercises to promote LE strength and endurance:  Sit to stand 4 x 10 with supervision to RW, greatly improved strength and mechanics from previous sessions Seated marches 4 x 20 with 4lb ankle weight Alternating LAQ 2 x 10 with 4 lb ankle weight, pt had difficulty with TKE.  Pt returned to room and remained in w/c, was left with all needs in reach and alarm active.   Therapy Documentation Precautions:  Precautions Precautions: Fall, Cervical Required Braces or Orthoses: Cervical Brace Cervical Brace: Hard collar, At all times, Other (comment) Restrictions Weight Bearing Restrictions: No    Therapy/Group: Individual Therapy  OMickel Fuchs10/08/2020,  10:38 AM

## 2021-01-23 NOTE — Progress Notes (Signed)
Occupational Therapy Session Note  Patient Details  Name: Logan [name] MRN: [id number] Date of Birth: 1945-06-11  Today's Date: 01/23/2021 OT Individual Time: 8138-8719 OT Individual Time Calculation (min): 24 min    Short Term Goals: Week 3:  OT Short Term Goal 1 (Week 3): STGs = LTGs at Min/Mod  Skilled Therapeutic Interventions/Progress Updates:  Patient met seated in wc in agreement with OT treatment session. 0/10 pain reported at rest and with activity. Total A for wc transport to dayroom for time management. Session with focus on BUE AROM and [place] in prep for ADLs. Patient required increased assist for sit to stand from wc to RW reporting fatigue from a.m. therapy session. In standing, patient able to screw/unscrew with use of R hand as a stabilizer and use of non-dominant L hand to complete task with use of screw driver. Cues for upright posture. Patient frequently dropping items. Red and yellow clothes pins placed on gait belt with focus on external rotation and shoulder abduction in prep for hygiene/clothing management during toileting tasks. Bucket placed on high/low table to promote reaching outside of BOS. Patient then completed 2 sets x20-30 reps each of gross grasp with black (greatest resistance) clothes pins. Session concluded with patient seated in wc with call bell within reach, belt alarm activated and all needs met.   Therapy Documentation Precautions:  Precautions Precautions: Fall, Cervical Required Braces or Orthoses: Cervical Brace Cervical Brace: Hard collar, At all times, Other (comment) Restrictions Weight Bearing Restrictions: No General:    Therapy/Group: Individual Therapy  Justun Anaya R Howerton-Davis 01/23/2021, 11:58 AM

## 2021-01-23 NOTE — Discharge Instructions (Addendum)
Information on my medicine - ELIQUIS (apixaban)  This medication education was reviewed with me or my healthcare representative as part of my discharge preparation.  The pharmacist that spoke with me during my hospital stay was:    Why was Eliquis prescribed for you? Eliquis was prescribed for you to reduce the risk of a blood clot forming that can cause a stroke if you have a medical condition called atrial fibrillation (a type of irregular heartbeat).  What do You need to know about Eliquis ? Take your Eliquis TWICE DAILY - one tablet in the morning and one tablet in the evening with or without food. If you have difficulty swallowing the tablet whole please discuss with your pharmacist how to take the medication safely.  Take Eliquis exactly as prescribed by your doctor and DO NOT stop taking Eliquis without talking to the doctor who prescribed the medication.  Stopping may increase your risk of developing a stroke.  Refill your prescription before you run out.  After discharge, you should have regular check-up appointments with your healthcare provider that is prescribing your Eliquis.  In the future your dose may need to be changed if your kidney function or weight changes by a significant amount or as you get older.  What do you do if you miss a dose? If you miss a dose, take it as soon as you remember on the same day and resume taking twice daily.  Do not take more than one dose of ELIQUIS at the same time to make up a missed dose.  Important Safety Information A possible side effect of Eliquis is bleeding. You should call your healthcare provider right away if you experience any of the following: Bleeding from an injury or your nose that does not stop. Unusual colored urine (red or dark brown) or unusual colored stools (red or black). Unusual bruising for unknown reasons. A serious fall or if you hit your head (even if there is no bleeding).  Some medicines may interact with  Eliquis and might increase your risk of bleeding or clotting while on Eliquis. To help avoid this, consult your healthcare provider or pharmacist prior to using any new prescription or non-prescription medications, including herbals, vitamins, non-steroidal anti-inflammatory drugs (NSAIDs) and supplements.  This website has more information on Eliquis (apixaban): http://www.eliquis.com/eliquis/home      Inpatient Rehab Discharge Instructions  Logan [name] Discharge date and time: 01/30/21   Activities/Precautions/ Functional Status: Activity: no lifting, driving, or strenuous exercise till cleare by MD Diet: regular diet Wound Care: keep wound clean and dry   Functional status:  ___ No restrictions     ___ Walk up steps independently _X__ 24/7 supervision/assistance   ___ Walk up steps with assistance ___ Intermittent supervision/assistance  ___ Bathe/dress independently ___ Walk with walker     __X_ Bathe/dress with assistance ___ Walk Independently    ___ Shower independently _S__ Walk with supervision.     ___ Shower with assistance _X__ No alcohol     ___ Return to work/school ________  Special Instructions: Collar needs to be on at all times--chin has to sit on chin rest and you can adjust collar as needed for support.  Collar can be removed for meals.    COMMUNITY REFERRALS UPON DISCHARGE:    Home Health:   PT & OT                 Agency: [facility]  PHONE: [phone]  Medical Equipment/Items Ordered: WHEELCHAIR, 3 IN 1 HAS ROLLING WALKER FROM A FAMILY MEMBER                                                 Agency/Supplier: [facility]  [phone]  My questions have been answered and I understand these instructions. I will adhere to these goals and the provided educational materials after my discharge from the hospital.  Patient/Caregiver Signature _______________________________  Date __________  Clinician Signature _______________________________________ Date __________  Please bring this form and your medication list with you to all your follow-up doctor's appointments.

## 2021-01-23 NOTE — Progress Notes (Signed)
Occupational Therapy Session Note  Patient Details  Name: Logan [name] MRN: [id number] Date of Birth: 22-Jan-1946  Today's Date: 01/23/2021 OT Individual Time: 1330-1425 OT Individual Time Calculation (min): 55 min    Short Term Goals: Week 3:  OT Short Term Goal 1 (Week 3): STGs = LTGs at Min/Mod  Skilled Therapeutic Interventions/Progress Updates:    Pt resting in w/c upon arrival with belt alarm activated. OT intervention with focus on doffing/donning cervical collar independently. Pt with limited use of RUE 2/2 previous injury. Adaptation made to Rt strap (extended to facilitate easier grasp and pulling loose.) Pt able to doff collar with supervision. Pt able to place front of collar on neck but unable to bring back piece around back and place correctly to fasten. Several attempts/techniques were unsuccessful. Will consult with clinical specialist. Pt reamined in w/c with all needs within reach and belt alarm activated.   Therapy Documentation Precautions:  Precautions Precautions: Fall, Cervical Required Braces or Orthoses: Cervical Brace Cervical Brace: Hard collar, At all times, Other (comment) Restrictions Weight Bearing Restrictions: No Pain:  Pt denies pain this afternoon    Therapy/Group: Individual Therapy  Leroy Libman 01/23/2021, 2:44 PM

## 2021-01-24 LAB — GLUCOSE, CAPILLARY
Glucose-Capillary: 98 mg/dL (ref 70–99)
Glucose-Capillary: 101 mg/dL — ABNORMAL HIGH (ref 70–99)

## 2021-01-24 NOTE — Progress Notes (Addendum)
Occupational Therapy Session Note  Patient Details  Name: Logan [name] MRN: [id number] Date of Birth: 1946-04-15  Today's Date: 01/24/2021 OT Individual Time: 0900-1012 OT Individual Time Calculation (min): 72 min    Short Term Goals: Week 2:  OT Short Term Goal 1 (Week 2): Pt will adhere to cervical precautions consistently during ADL w/ no more than Min cues OT Short Term Goal 1 - Progress (Week 2): Met OT Short Term Goal 2 (Week 2): Pt will increase Right shoulder scaption to 50* to improve functional skill performance OT Short Term Goal 2 - Progress (Week 2): Not met OT Short Term Goal 3 (Week 2): Pt will perform BSC/toilet transfers with LRAD and Supervision OT Short Term Goal 3 - Progress (Week 2): Progressing toward goal OT Short Term Goal 4 (Week 2): Pt will perform LB dress with AE PRN with Min A OT Short Term Goal 4 - Progress (Week 2): Progressing toward goal Week 3:  OT Short Term Goal 1 (Week 3): STGs = LTGs at Min/Mod  Skilled Therapeutic Interventions/Progress Updates:  Patient met seated in wc in agreement with OT treatment session. 0/10 pain reported at rest and with activity. Treatment session with focus on family education. Son Logan [name] present and participatory. Patient completed toilet transfer and 3/3 parts of toileting task with Min A for steadying. Walk-in shower transfer with Min A and cues for hand placement. [name] able to doff/don Designer, multimedia after education. Education also provided on removing/replacing pads and how to cleanse them with warm water and gentle soap. Son expressed verbal understanding. Long-handled sponge issued to maximize independence with LB bathing. Assist for trying bilateral lower legs and feet. Son assisted with transfer from walk-in shower to [facility] over standard height toilet. Education provided on purpose of TED hose and application. Son able to don L TED hose after visual/verbal instruction from this Probation officer. Discussed AE/DME including hip kit, BSC  (covered by insurance) and tub bench. Patient/family interested in purchasing hip kit and tub bench on their own. Visual eduction on use of tub bench in tub/shower combo. Son Logan [name] taken to ADL apartment to gauge idea for placement of grab bars. All other patient/family questions answered. Session concluded with patient seated in wc with call bell within reach, belt alarm activated and all needs met.   Therapy Documentation Precautions:  Precautions Precautions: Fall, Cervical Required Braces or Orthoses: Cervical Brace Cervical Brace: Hard collar, At all times, Other (comment) Restrictions Weight Bearing Restrictions: No General:   Therapy/Group: Individual Therapy  [name] R Howerton-Davis 01/24/2021, 6:51 AM

## 2021-01-24 NOTE — Progress Notes (Signed)
Patient ID: Logan [name], male   DOB: [birth year], 75 y.o.   MRN: [id number]  Son here for education and both report it went well. Son reports good to see him moving and walking. Pt is having a good day today, feels better. Wheelchair and 3 in 1 in his room told son to take those home. Both feel they have a rolling walker for pt to use at home, so no need for this. Pt is using a [place] suction he is not sure if he needs this at home. Will wait until next week prior to discharge home. Will continue to work on discharge needs.

## 2021-01-24 NOTE — Progress Notes (Signed)
Physical Therapy Session Note  Patient Details  Name: Logan [name] MRN: [id number] Date of Birth: [birth year]  Today's Date: 01/24/2021 PT Individual Time: 1100-1158 PT Individual Time Calculation (min): 58 min   Short Term Goals: Week 1:  PT Short Term Goal 1 (Week 1): Pt will perform supine to sit w/mod assist and LRAD PT Short Term Goal 1 - Progress (Week 1): Met PT Short Term Goal 2 (Week 1): Pt will transfer bed to/from wc w/mod assist and LRAD PT Short Term Goal 2 - Progress (Week 1): Met PT Short Term Goal 3 (Week 1): Pt will ambulate 2f w/min assist and LRAD PT Short Term Goal 3 - Progress (Week 1): Met Week 2:  PT Short Term Goal 1 (Week 2): =LTGs d/t ELOS PT Short Term Goal 1 - Progress (Week 2): Met  Skilled Therapeutic Interventions/Progress Updates:    Session 1: Pt seated in w/c on arrival and agreeable to therapy. No complaint of pain. Pt's son, DRobertt present throughout session for family education. Pt transported to therapy gym for time management and energy conservation. Sit to stand with min A, and as little as CGA at times, to RW from various surfaces. Car transfer with supervision, greatly improved from previous attempts. Pt performed bed mobility to both sides, mod A R sidelying to sit and supervision L sidelying to sit. Discussed pt's home set up and best set up for independence. Pt then navigated 6" steps with BIL handrails and CGA. Pt reported feeling fatigued after steps, but glad since he had not been able to do them before. Pt navigated curb step x 6 with seated rest breaks between, CGA with RW. Gait x 200 ft with RW and close supervision. Pt continues to demo L hip hike with gait, but catches toe less often and appears more balanced during gait than previous session. MD consult in hall way. Returned to pt's room and pt remained in w/c, CSW in/out for consult. Pt was left with all needs in reach and alarm active.   Session 2: Pt seated in w/c on arrival and  agreeable to therapy. Pt reports no pain in hip or shoulder today, but that they feel weak. Pt's w/c has been delivered to his room, therapist adjusted leg rests and demoed how to break down chair. Pt transported to therapy gym for time management and energy conservation. Stand pivot transfer to nustep with RW and CGA. Nustep at level 5, 3 x 5 minutes, with RPE of 17 for increased endurance and LE strength. Pt then directed in step taps for coordination and LE strength to navigate 6" step in home. Pt required extended rest breaks during standing activity d/t fatigue. Gait 2 x ~90 ft with supervision and RW.  Pt continues to demo decr stance time on LLE and L hip hike, min improvement with cueing. Pt returned to room and remained sitting in w/c with all needs in reach and alarm active.   Therapy Documentation Precautions:  Precautions Precautions: Fall, Cervical Required Braces or Orthoses: Cervical Brace Cervical Brace: Hard collar, At all times, Other (comment) Restrictions Weight Bearing Restrictions: No      Therapy/Group: Individual Therapy  OMickel Fuchs10/09/2020, 12:09 PM

## 2021-01-24 NOTE — Progress Notes (Signed)
[place] PHYSICAL MEDICINE & REHABILITATION PROGRESS NOTE  Subjective/Complaints:   Sleeping sitting up in w/c-  Did bowel program last night-  Using ICS.   ROS:   Pt denies SOB, abd pain, CP, N/V/C/D, and vision changes  Objective: Vital Signs: Blood pressure 114/60, pulse 69, temperature 97.8 F (36.6 C), resp. rate 18, height 5\' 7"  (1.702 m), weight 87.1 kg, SpO2 98 %. No results found. No results for input(s): WBC, HGB, HCT, PLT in the last 72 hours.   No results for input(s): NA, K, CL, CO2, GLUCOSE, BUN, CREATININE, CALCIUM in the last 72 hours.    Intake/Output Summary (Last 24 hours) at 01/24/2021 0922 Last data filed at 01/24/2021 0723 Gross per 24 hour  Intake 600 ml  Output 1475 ml  Net -875 ml        Physical Exam: BP 114/60 (BP Location: Left Arm)   Pulse 69   Temp 97.8 F (36.6 C)   Resp 18   Ht 5\' 7"  (1.702 m)   Wt 87.1 kg   SpO2 98%   BMI 30.07 kg/m           General: awake, alert, appropriate, but sleeping with collar around mouth! NAD HENT: conjugate gaze; oropharynx moist CV: regular rate; no JVD Pulmonary: CTA B/L; no W/R/R- good air movement- sounds great after using ICS GI: soft, NT, ND, (+)BS Psychiatric: appropriate; but sleepy Neurology: Alert  MS: R anterior shoulder dislocation- chronic Sensation intact to LT in BUE Fine motor able to oppose finger to thumb in BUEs Unable to lift RUE against gravity- has what appears to be R shoulder anteriorly dislocated on exam- is chronic-no change Motor: Bilateral upper extremities: Shoulder abduction 2/5, elbow flexion3+ /extension 2+/5, handgrip 4/5, right weaker than left, stable Bilateral lower extremities: 4-/5 proximal distal Extremities: 1+ LE edema to lower calves B/L   Assessment/Plan: 1. Functional deficits which require 3+ hours per day of interdisciplinary therapy in a comprehensive inpatient rehab setting. Physiatrist is providing close team supervision and 24 hour  management of active medical problems listed below. Physiatrist and rehab team continue to assess barriers to discharge/monitor patient progress toward functional and medical goals   Care Tool:  Bathing    Body parts bathed by patient: Right arm, Left arm, Chest, Abdomen, Front perineal area, Right upper leg, Left upper leg   Body parts bathed by helper: Face, Buttocks, Left lower leg, Right lower leg     Bathing assist Assist Level: Moderate Assistance - Patient 50 - 74%     Upper Body Dressing/Undressing Upper body dressing   What is the patient wearing?: Button up shirt    Upper body assist Assist Level: Maximal Assistance - Patient 25 - 49%    Lower Body Dressing/Undressing Lower body dressing      What is the patient wearing?: Pants, Incontinence brief     Lower body assist Assist for lower body dressing: Maximal Assistance - Patient 25 - 49%     Toileting Toileting    Toileting assist Assist for toileting: Minimal Assistance - Patient > 75%     Transfers Chair/bed transfer  Transfers assist     Chair/bed transfer assist level: Contact Guard/Touching assist Chair/bed transfer assistive device: Programmer, multimedia   Ambulation assist      Assist level: Supervision/Verbal cueing Assistive device: Walker-rolling Max distance: 20   Walk 10 feet activity   Assist     Assist level: Supervision/Verbal cueing Assistive device: Walker-rolling   Walk  50 feet activity   Assist Walk 50 feet with 2 turns activity did not occur: Safety/medical concerns  Assist level: Contact Guard/Touching assist Assistive device: Walker-rolling    Walk 150 feet activity   Assist Walk 150 feet activity did not occur: Safety/medical concerns         Walk 10 feet on uneven surface  activity   Assist Walk 10 feet on uneven surfaces activity did not occur: Safety/medical concerns         Wheelchair     Assist Is the patient using a  wheelchair?: Yes Type of Wheelchair: Manual    Wheelchair assist level: Dependent - Patient 0% Max wheelchair distance: 136 ft    Wheelchair 50 feet with 2 turns activity    Assist        Assist Level: Dependent - Patient 0%   Wheelchair 150 feet activity     Assist  Wheelchair 150 feet activity did not occur: Safety/medical concerns   Assist Level: Dependent - Patient 0%    Medical Problem List and Plan: 1.  Cervical myelopathy- incopletel quadriplegia-  s/p C3-4 ACDF on 01/01/21  Con't PT and OT/CIR_ WBAT on RUE- for shoulder dislocation-   10/5- con't PT and OT- change d/c to 01/30/21 so can go home closer to mod I- family cannot provide more care. Needs bidet!  10/6- con't PT and OT- kept later 2.  Impaired mobility:  -DVT/anticoagulation:  Pharmaceutical: Heparin--now d/ced Was on Eliquis PTA--held due to surgery--> resumed on 9/25             -antiplatelet therapy: N/A 3. Endstate OA left hip/Pain Management: Oxycodone prn             Continue voltaren gel to left hip QID             Xray showing left hip OA  9/28- pt needs R shoulder reverse shoulder arthroplasty- MRI and CT done- con't pain regimen for now.   9/29- also needs L hip replacement long term  10/6- pain controlled- con't regimen Monitor with increased exertion 4. Mood: LCSW to follow for evaluation and support.              -antipsychotic agents: N/A 5. Neuropsych: This patient is capable of making decisions on his own behalf. 6. Bilateral knee abraisons/cervical insicion:  Routine pressure relief measures--monitor incision for healing.              --D/ced bacitracin ointement Continue Aquacell with dry dressing to bilateral knee abrasions.  7. Fluids/Electrolytes/Nutrition: Monitor I/Os.  8. CAF: Monitor HR TID--continue to hold eliquis till 9/23             --On coreg BID.   9/27- change to daily weights  9/28- weight stable to lower- con't regimen  10/2- weight up slightly, but back to  baseline- con't to monitor  10/5- Weight up to 85.9 kg- will recheck in AM and if still rising, will need to go from there.   10/4- weight up- Lasix was held- will make sure nursing gives- since weight up so much.   10/5- weight back down 1 kg- will con't Lasix- but change parameters to hold if <100/60.   10/6- weight about the same- wil con't Lasix unless BP <161 systolic. Filed Weights   01/22/21 0353 01/23/21 0418 01/24/21 0328  Weight: 86.7 kg 85.6 kg 87.1 kg  9.  Dehydration: Encourage fluid intake. Monitor orthostatic symptoms  9/30- Pt's BUN up to 26- will give IVFs  x 1 day 75cc/hour- and push fluids- c/o being weak- might need U/A and Cx if has fever?  10/3- BUN up to 28- is more dehydrated, but feeling OK- will monitor and give more IVFs if needed- can d/c IV for now.  10. Hypoxia/ Productive cough: Will down grade diet to dysphagia 3 with aspiration precautions/supervison             --has been cleared to remove collar for meals.              --CXR showing atelectasis  Wean off supplemental oxygen  10/3- off O2  10/5- Con't ICS and push usage- if doesn't improve, will check CXR.   10/6- sounds much better this AM- will con't ICS 11.  Prediabetes diet controlled?: Has been off meds for 5 years.  Hemoglobin A1c 5.7 on 9/16 Relatively controlled on 9/21 10/5- will stop Insulin and change CBGs to BID before meals.  Monitor with increased mobility 12. HTN: Monitor BP TID--continue Lasix and Cozaar. BP currently soft but as expected for SCI Vitals:   01/24/21 0328 01/24/21 0729  BP: (!) 126/56 114/60  Pulse: 64 69  Resp: 18   Temp: 97.8 F (36.6 C)   SpO2: 95% 98%  Controlled monitor for orthostatic changes  10/4- BP low- held BP meds due to soft BP.   10/6- BP much better today- con't regimen 13.  Macrocytic anemia:   Folate within normal limits on 9/16 Hemoglobin 11.5 on 9/19 14. OSA: Does not use CPAP. 15. Constipation now with loose stools and neurogenic bowel: Has not  had BM for a couple of days             Added Senna S.   MiraLAX started on 9/16  10/3- was having loose stools, but doing better- will monitor  10/4- will stop Miralax and make prn  10/6- doing bowel program- con't regimen 16. Overweight BMI 28.82: provide dietary education 17.  Hypokalemia  Potassium 3.9 on 9/19, plan to repeat labs on Friday  Supplemented increased on 9/16  18.  Hypoalbuminemia  Supplement initiated on 9/16 19.  Right> Left shoulder abd limitation with ant subluxation deformity on RIght side which appears chronic . Pt denies hx of shoulder surgery.  No pain with ROM, no xrays in Epic will order shoulder 2 V RIght   9/27- called Ortho about shoulder dislocation on R- will get CT and MRI- needs reverse shoulder surgery done eventually- can WBAT and no limitation on fROM per Ortho  9/28- MRI and CT done for surgical planning- torn and retracted biceps tendon; retracted rotator cuff tear.  20. Cough  9/28- asked pt to use ICS q1 hour while awake.   9/29- sounds MUCH better- con't ICS while awake  10/5- encourage ICS usage-  21. Mild LE edema  9/29- will add TEDs when up.  22. Generalized weakness  9/30- feeling weak this AM- gave IVFs and might need U/A and Cx.   10/2- resolved- con't to monitor   LOS: 21 days A FACE TO FACE EVALUATION WAS PERFORMED  Endi Lagman 01/24/2021, 9:22 AM

## 2021-01-25 LAB — GLUCOSE, CAPILLARY
Glucose-Capillary: 98 mg/dL (ref 70–99)
Glucose-Capillary: 80 mg/dL (ref 70–99)

## 2021-01-25 MED ORDER — TRAMADOL HCL 50 MG PO TABS
50.0000 mg | ORAL_TABLET | Freq: Four times a day (QID) | ORAL | Status: DC | PRN
Start: 2021-01-25 — End: 2021-01-30
  Administered 2021-01-25 – 2021-01-30 (×10): 50 mg via ORAL
  Filled 2021-01-25 (×13): qty 1

## 2021-01-25 NOTE — Progress Notes (Signed)
[place] PHYSICAL MEDICINE & REHABILITATION PROGRESS NOTE  Subjective/Complaints:  Sleeping sitting up in room, then seen in gym.   C/O hip pain all night- tylenol slightly helpful- willing to try Tramadol.      ROS:   Pt denies SOB, abd pain, CP, N/V/C/D, and vision changes   Objective: Vital Signs: Blood pressure 117/69, pulse 61, temperature 97.8 F (36.6 C), temperature source Oral, resp. rate 17, height 5\' 7"  (1.702 m), weight 86.6 kg, SpO2 95 %. No results found. No results for input(s): WBC, HGB, HCT, PLT in the last 72 hours.   No results for input(s): NA, K, CL, CO2, GLUCOSE, BUN, CREATININE, CALCIUM in the last 72 hours.    Intake/Output Summary (Last 24 hours) at 01/25/2021 1103 Last data filed at 01/25/2021 0251 Gross per 24 hour  Intake 840 ml  Output 325 ml  Net 515 ml        Physical Exam: BP 117/69   Pulse 61   Temp 97.8 F (36.6 C) (Oral)   Resp 17   Ht 5\' 7"  (1.702 m)   Wt 86.6 kg   SpO2 95%   BMI 29.90 kg/m           General: awake, alert, appropriate, but sleeping with collar around mouth; NAD HENT: conjugate gaze; oropharynx moist CV: regular rate and rhythm; no JVD Pulmonary: CTA B/L; no W/R/R- good air movement- sounds great after using ICS again today GI: soft, NT, ND, (+)BS Psychiatric: appropriate; less sleepy in gym Neurology: Alert  MS: R anterior shoulder dislocation- chronic Sensation intact to LT in BUE Fine motor able to oppose finger to thumb in BUEs Unable to lift RUE against gravity- has what appears to be R shoulder anteriorly dislocated on exam- is chronic-no change Motor: Bilateral upper extremities: Shoulder abduction 2/5, elbow flexion3+ /extension 2+/5, handgrip 4/5, right weaker than left, stable Bilateral lower extremities: 4-/5 proximal distal Extremities: 1+ LE edema to lower calves B/L   Assessment/Plan: 1. Functional deficits which require 3+ hours per day of interdisciplinary therapy in a  comprehensive inpatient rehab setting. Physiatrist is providing close team supervision and 24 hour management of active medical problems listed below. Physiatrist and rehab team continue to assess barriers to discharge/monitor patient progress toward functional and medical goals   Care Tool:  Bathing    Body parts bathed by patient: Right arm, Left arm, Chest, Abdomen, Front perineal area, Right upper leg, Left upper leg, Buttocks, Face   Body parts bathed by helper: Right lower leg, Left lower leg     Bathing assist Assist Level: Minimal Assistance - Patient > 75%     Upper Body Dressing/Undressing Upper body dressing   What is the patient wearing?: Button up shirt    Upper body assist Assist Level: Minimal Assistance - Patient > 75%    Lower Body Dressing/Undressing Lower body dressing      What is the patient wearing?: Pants, Incontinence brief     Lower body assist Assist for lower body dressing: Minimal Assistance - Patient > 75%     Toileting Toileting    Toileting assist Assist for toileting: Minimal Assistance - Patient > 75%     Transfers Chair/bed transfer  Transfers assist     Chair/bed transfer assist level: Supervision/Verbal cueing Chair/bed transfer assistive device: Programmer, multimedia   Ambulation assist      Assist level: Supervision/Verbal cueing Assistive device: Walker-rolling Max distance: 200   Walk 10 feet activity   Assist  Assist level: Supervision/Verbal cueing Assistive device: Walker-rolling   Walk 50 feet activity   Assist Walk 50 feet with 2 turns activity did not occur: Safety/medical concerns  Assist level: Supervision/Verbal cueing Assistive device: Walker-rolling    Walk 150 feet activity   Assist Walk 150 feet activity did not occur: Safety/medical concerns  Assist level: Supervision/Verbal cueing Assistive device: Walker-rolling    Walk 10 feet on uneven surface   activity   Assist Walk 10 feet on uneven surfaces activity did not occur: Safety/medical concerns         Wheelchair     Assist Is the patient using a wheelchair?: Yes Type of Wheelchair: Manual    Wheelchair assist level: Dependent - Patient 0% Max wheelchair distance: 136 ft    Wheelchair 50 feet with 2 turns activity    Assist        Assist Level: Dependent - Patient 0%   Wheelchair 150 feet activity     Assist  Wheelchair 150 feet activity did not occur: Safety/medical concerns   Assist Level: Dependent - Patient 0%    Medical Problem List and Plan: 1.  Cervical myelopathy- incopletel quadriplegia-  s/p C3-4 ACDF on 01/01/21  Con't PT and OT/CIR_ WBAT on RUE- for shoulder dislocation-   10/5- con't PT and OT- change d/c to 01/30/21 so can go home closer to mod I- family cannot provide more care. Needs bidet!  110/7- con't PT and OT- d/c 10/12 2.  Impaired mobility:  -DVT/anticoagulation:  Pharmaceutical: Heparin--now d/ced Was on Eliquis PTA--held due to surgery--> resumed on 9/25             -antiplatelet therapy: N/A 3. Endstate OA left hip/Pain Management: Oxycodone prn             Continue voltaren gel to left hip QID             Xray showing left hip OA  9/28- pt needs R shoulder reverse shoulder arthroplasty- MRI and CT done- con't pain regimen for now.   9/29- also needs L hip replacement long term  10/7- having more hip pain, so will add tramadol 50 mg q6 hours prn for severe pain.  Monitor with increased exertion 4. Mood: LCSW to follow for evaluation and support.              -antipsychotic agents: N/A 5. Neuropsych: This patient is capable of making decisions on his own behalf. 6. Bilateral knee abraisons/cervical insicion:  Routine pressure relief measures--monitor incision for healing.              --D/ced bacitracin ointement Continue Aquacell with dry dressing to bilateral knee abrasions.  7. Fluids/Electrolytes/Nutrition: Monitor  I/Os.  8. CAF: Monitor HR TID--continue to hold eliquis till 9/23             --On coreg BID.   9/27- change to daily weights  9/28- weight stable to lower- con't regimen  10/2- weight up slightly, but back to baseline- con't to monitor  10/5- Weight up to 85.9 kg- will recheck in AM and if still rising, will need to go from there.   10/4- weight up- Lasix was held- will make sure nursing gives- since weight up so much.   10/5- weight back down 1 kg- will con't Lasix- but change parameters to hold if <100/60.   10/6- weight about the same- wil con't Lasix unless BP <161 systolic. Filed Weights   01/23/21 0418 01/24/21 0328 01/25/21 0536  Weight: 85.6  kg 87.1 kg 86.6 kg  9.  Dehydration: Encourage fluid intake. Monitor orthostatic symptoms  9/30- Pt's BUN up to 26- will give IVFs x 1 day 75cc/hour- and push fluids- c/o being weak- might need U/A and Cx if has fever?  10/3- BUN up to 28- is more dehydrated, but feeling OK- will monitor and give more IVFs if needed- can d/c IV for now.  10. Hypoxia/ Productive cough: Will down grade diet to dysphagia 3 with aspiration precautions/supervison             --has been cleared to remove collar for meals.              --CXR showing atelectasis  Wean off supplemental oxygen  10/3- off O2  10/5- Con't ICS and push usage- if doesn't improve, will check CXR.   10/6- sounds much better this AM- will con't ICS 11.  Prediabetes diet controlled?: Has been off meds for 5 years.  Hemoglobin A1c 5.7 on 9/16 Relatively controlled on 9/21 10/5- will stop Insulin and change CBGs to BID before meals.  Monitor with increased mobility 12. HTN: Monitor BP TID--continue Lasix and Cozaar. BP currently soft but as expected for SCI Vitals:   01/25/21 0536 01/25/21 0846  BP: 117/65 117/69  Pulse: 64 61  Resp: 17   Temp: 97.8 F (36.6 C)   SpO2: 95%   Controlled monitor for orthostatic changes  10/7- BP doing better- con't regimen 13.  Macrocytic anemia:    Folate within normal limits on 9/16 Hemoglobin 11.5 on 9/19 14. OSA: Does not use CPAP. 15. Constipation now with loose stools and neurogenic bowel: Has not had BM for a couple of days             Added Senna S.   MiraLAX started on 9/16  10/3- was having loose stools, but doing better- will monitor  10/4- will stop Miralax and make prn  10/6- doing bowel program- con't regimen 16. Overweight BMI 28.82: provide dietary education 17.  Hypokalemia  Potassium 3.9 on 9/19, plan to repeat labs on Friday  Supplemented increased on 9/16  18.  Hypoalbuminemia  Supplement initiated on 9/16 19.  Right> Left shoulder abd limitation with ant subluxation deformity on RIght side which appears chronic . Pt denies hx of shoulder surgery.  No pain with ROM, no xrays in Epic will order shoulder 2 V RIght   9/27- called Ortho about shoulder dislocation on R- will get CT and MRI- needs reverse shoulder surgery done eventually- can WBAT and no limitation on fROM per Ortho  9/28- MRI and CT done for surgical planning- torn and retracted biceps tendon; retracted rotator cuff tear.  20. Cough  9/28- asked pt to use ICS q1 hour while awake.   9/29- sounds MUCH better- con't ICS while awake  10/5- encourage ICS usage-  21. Mild LE edema  9/29- will add TEDs when up.  22. Generalized weakness  9/30- feeling weak this AM- gave IVFs and might need U/A and Cx.   10/2- resolved- con't to monitor 23. Dispo  10/7- spoke with son - pt needs bidet! Went over this precisely with son during family training.   LOS: 22 days A FACE TO FACE EVALUATION WAS PERFORMED  Caddie Randle 01/25/2021, 11:03 AM

## 2021-01-25 NOTE — Progress Notes (Signed)
Physical Therapy Session Note  Patient Details  Name: Logan [name] MRN: [id number] Date of Birth: [birth year]  Today's Date: 01/25/2021 PT Individual Time: [id number] PT Individual Time Calculation (min): 75 min, 75 min  Short Term Goals: Week 3:  PT Short Term Goal 1 (Week 3): =LTGs d/t ELOS  Skilled Therapeutic Interventions/Progress Updates:    Session 1: Pt seated in w/c on arrival and agreeable to therapy. Pt reported that he had a bad night, with hip pain keeping him from sleeping and reported current 6/10 pain in hip. Nsg administered tramadol during session. Pt performed ambulatory transfer to bathroom and performed 3/3 toileting tasks with min A for balance and thoroughness with hygiene. Pt stood at sink with supervision for hand hgiene.  Pt transported to therapy gym for time management and energy conservation. Session focused on gait training with extended rest breaks d/t pain and fatigue. During rest breaks, discussed pt's imaging of hip and shoulder, as well as expectations for those future operations. Gait 5 x 90 ft with supervision and RW. Cues for upright posture and increased stance time on LLE, limited by pain. Pt returned to room and remained seated in w/c, was left with all needs in reach and alarm active.   Session 2: Pt seated in w/c on arrival and agreeable to therapy. Pt with no immediate reports of pain, but some discomfort with activity, addressed with rest breaks. Pt transported to outdoor environment for stress management and mental health benefit. Gait 4 x 100 ft with CGA and RW over uneven surface with seated rest break between bouts. Pt then directed in seated marches 3 x 20 and LAQ with 5 sec hold, 2 x 5 BIL for LE strength and endurance. Discussed d/c planning and need for assist during the day as pt's condition fluctuates between good days and bad days. Pt states one of his sons does not work 4 days a week and would be available to assist. Pt propelled w/c with  BUE over uneven surfaces with min A for slight uphill grade and steering on uneven surface. Discussed that pt will likely need assistance with w/c when outside the home. Pt returned to his room and remained in w/c, was left with all needs in reach and alarm active.   Therapy Documentation Precautions:  Precautions Precautions: Fall, Cervical Required Braces or Orthoses: Cervical Brace Cervical Brace: Hard collar, At all times, Other (comment) Restrictions Weight Bearing Restrictions: No     Therapy/Group: Individual Therapy  Mickel Fuchs 01/25/2021, 9:57 AM

## 2021-01-25 NOTE — Progress Notes (Addendum)
Occupational Therapy Session Note  Patient Details  Name: Logan [name] MRN: [id number] Date of Birth: 1945/05/01  Today's Date: 01/25/2021 OT Individual Time: 0702-0815 OT Individual Time Calculation (min): 73 min    Short Term Goals: Week 3:  OT Short Term Goal 1 (Week 3): STGs = LTGs at Min/Mod  Skilled Therapeutic Interventions/Progress Updates:  Patient met seated in wc in agreement with OT treatment session. 8/10 pain reported at rest and with activity in L hip. Patient reports that he feels pain is related to heavy day of therapies yesterday. Reports pain kept him awake at night. RN administered Tylenol for pain management. Patient given time to complete breakfast meal. Completed 100% of meal with Min spillage. Assist to don Aspen collar after meal. Patient denied need for ADLs. Total A for wc transport to rehab gym for time management. Patient participated in [facility] activity incorporating dynamic sitting balance with reaching outside of BOS. Patient limited greatly by L hip pain requiring heavy Min A for sit to stand from wc to RW. Stand-pivot to mat table with Min guard and increased time. Noted difficulty clearing RLE with lateral steps. Sit to supine with cues for log rolling technique and assist to advance BLE from edge of mat to mat surface in prep for L hip streching. In semi-reclined position with large wedge behind head, patient reports difficulty breathing. Aspen collar removed but patient continued to c/o difficulty breathing. Returned to sitting with Mod A. Patient able to self-propel wc from rehab gym to room. Severely limited AROM in bilateral shoulders R>L limites patients independence with ADLs including dressing/grooming tasks. Patient reports desire to be able to comb his own hair. Constructed adapted comb with blue foam to increase independence and decrease caregiver burden. Session concluded with patient seated in wc with call bell within reach, belt alarm activated and all needs  met.   Therapy Documentation Precautions:  Precautions Precautions: Fall, Cervical Required Braces or Orthoses: Cervical Brace Cervical Brace: Hard collar, At all times, Other (comment) Restrictions Weight Bearing Restrictions: No General:    Therapy/Group: Individual Therapy  Willamae Demby R Howerton-Davis 01/25/2021, 6:55 AM

## 2021-01-26 LAB — GLUCOSE, CAPILLARY
Glucose-Capillary: 84 mg/dL (ref 70–99)
Glucose-Capillary: 135 mg/dL — ABNORMAL HIGH (ref 70–99)

## 2021-01-26 NOTE — Progress Notes (Signed)
Occupational Therapy Session Note  Patient Details  Name: Logan [name] MRN: [id number] Date of Birth: 1945/07/25  Today's Date: 01/26/2021 OT Individual Time: 7680-8811 OT Individual Time Calculation (min): 45 min    Short Term Goals: Week 1:  OT Short Term Goal 1 (Week 1): Pt will complete sit<>stand transfers using LRAD with CGA in preperation for dressing and toileting OT Short Term Goal 1 - Progress (Week 1): Met OT Short Term Goal 2 (Week 1): Pt will complete UB dressing with min assist OT Short Term Goal 2 - Progress (Week 1): Not met OT Short Term Goal 3 (Week 1): Pt will increased right shoulder scaption to 50 degrees for functional reaching tasks. OT Short Term Goal 3 - Progress (Week 1): Progressing toward goal OT Short Term Goal 4 (Week 1): Pt will donn shorts using reacher with min assist. OT Short Term Goal 4 - Progress (Week 1): Progressing toward goal Week 2:  OT Short Term Goal 1 (Week 2): Pt will adhere to cervical precautions consistently during ADL w/ no more than Min cues OT Short Term Goal 1 - Progress (Week 2): Met OT Short Term Goal 2 (Week 2): Pt will increase Right shoulder scaption to 50* to improve functional skill performance OT Short Term Goal 2 - Progress (Week 2): Not met OT Short Term Goal 3 (Week 2): Pt will perform BSC/toilet transfers with LRAD and Supervision OT Short Term Goal 3 - Progress (Week 2): Progressing toward goal OT Short Term Goal 4 (Week 2): Pt will perform LB dress with AE PRN with Min A OT Short Term Goal 4 - Progress (Week 2): Progressing toward goal Week 3:  OT Short Term Goal 1 (Week 3): STGs = LTGs at Min/Mod  Skilled Therapeutic Interventions/Progress Updates:  Patient met lying supine in bed in agreement with OT treatment session. 3/10 pain reported at rest and with activity in L hip. Patient still reliant on external assist for supine to EOB transfers with HOB elevated. Reports sleeping in standard bed at home. Patient with  difficulty lying on wedge in rehab gym on mat table yesterday. Discussed plan for sleeping arrangements at home. Patient reports having several pillows at home to sleep on. This Probation officer again reminded patient that he had difficulty breathing on mat table with large wedge and required significant assist to return to sitting from supine. After some thought patient reports having a recliner that he could sleep in at home. Plan to transport patient to ADL apartment to simulate furniture transfers in home environment but reported need for BM. Patient required Min guard for hygiene/clothing management in standing with use of RW and increased time. Patient then able to return demo theraputty HEP without cues. Session concluded with patient lying semi-reclined in bed with call bell within reach, bed alarm activated and all needs met.   Therapy Documentation Precautions:  Precautions Precautions: Fall, Cervical Required Braces or Orthoses: Cervical Brace Cervical Brace: Hard collar, At all times, Other (comment) Restrictions Weight Bearing Restrictions: No General:    Therapy/Group: Individual Therapy  Sharna Gabrys R Howerton-Davis 01/26/2021, 7:17 AM

## 2021-01-26 NOTE — Progress Notes (Signed)
[facility] PHYSICAL MEDICINE & REHABILITATION PROGRESS NOTE  Subjective/Complaints:  Has pain but says he's tolerating. "I'm giving 100% in therapy"  ROS: Patient denies fever, rash, sore throat, blurred vision, nausea, vomiting, diarrhea, cough, shortness of breath or chest pain, headache, or mood change.   Objective: Vital Signs: Blood pressure (!) 106/55, pulse 73, temperature 97.7 F (36.5 C), temperature source Oral, resp. rate 16, height 5\' 7"  (1.702 m), weight 86.5 kg, SpO2 99 %. No results found. No results for input(s): WBC, HGB, HCT, PLT in the last 72 hours.   No results for input(s): NA, K, CL, CO2, GLUCOSE, BUN, CREATININE, CALCIUM in the last 72 hours.    Intake/Output Summary (Last 24 hours) at 01/26/2021 1023 Last data filed at 01/26/2021 0900 Gross per 24 hour  Intake 506 ml  Output 1250 ml  Net -744 ml        Physical Exam: BP (!) 106/55 (BP Location: Left Arm)   Pulse 73   Temp 97.7 F (36.5 C) (Oral)   Resp 16   Ht 5\' 7"  (1.702 m)   Wt 86.5 kg   SpO2 99%   BMI 29.87 kg/m   Constitutional: No distress . Vital signs reviewed. HEENT: NCAT, EOMI, oral membranes moist Neck: supple. Out of collar Cardiovascular: RRR without murmur. No JVD    Respiratory/Chest: CTA Bilaterally without wheezes or rales. Normal effort    GI/Abdomen: BS +, non-tender, non-distended Ext: no clubbing, cyanosis, or edema Psych: pleasant and cooperative   MS: R anterior shoulder dislocation- chronic Sensation intact to LT in BUE Fine motor able to oppose finger to thumb in BUEs Unable to lift RUE against gravity- has what appears to be R shoulder anteriorly dislocated on exam---chronic  Motor: Bilateral upper extremities: Shoulder abduction 2/5, elbow flexion3+ /extension 2+/5, handgrip 4/5, right weaker than left, stable Bilateral lower extremities: 4-/5 proximal distal Extremities: 1+ LE edema to lower calves B/L ongoing   Assessment/Plan: 1. Functional deficits  which require 3+ hours per day of interdisciplinary therapy in a comprehensive inpatient rehab setting. Physiatrist is providing close team supervision and 24 hour management of active medical problems listed below. Physiatrist and rehab team continue to assess barriers to discharge/monitor patient progress toward functional and medical goals   Care Tool:  Bathing    Body parts bathed by patient: Right arm, Left arm, Chest, Abdomen, Front perineal area, Right upper leg, Left upper leg, Buttocks, Face   Body parts bathed by helper: Right lower leg, Left lower leg     Bathing assist Assist Level: Minimal Assistance - Patient > 75%     Upper Body Dressing/Undressing Upper body dressing   What is the patient wearing?: Button up shirt    Upper body assist Assist Level: Minimal Assistance - Patient > 75%    Lower Body Dressing/Undressing Lower body dressing      What is the patient wearing?: Pants, Incontinence brief     Lower body assist Assist for lower body dressing: Minimal Assistance - Patient > 75%     Toileting Toileting    Toileting assist Assist for toileting: Minimal Assistance - Patient > 75%     Transfers Chair/bed transfer  Transfers assist     Chair/bed transfer assist level: Supervision/Verbal cueing Chair/bed transfer assistive device: Programmer, multimedia   Ambulation assist      Assist level: Supervision/Verbal cueing Assistive device: Walker-rolling Max distance: 200   Walk 10 feet activity   Assist     Assist  level: Supervision/Verbal cueing Assistive device: Walker-rolling   Walk 50 feet activity   Assist Walk 50 feet with 2 turns activity did not occur: Safety/medical concerns  Assist level: Supervision/Verbal cueing Assistive device: Walker-rolling    Walk 150 feet activity   Assist Walk 150 feet activity did not occur: Safety/medical concerns  Assist level: Supervision/Verbal cueing Assistive device:  Walker-rolling    Walk 10 feet on uneven surface  activity   Assist Walk 10 feet on uneven surfaces activity did not occur: Safety/medical concerns         Wheelchair     Assist Is the patient using a wheelchair?: Yes Type of Wheelchair: Manual    Wheelchair assist level: Dependent - Patient 0% Max wheelchair distance: 136 ft    Wheelchair 50 feet with 2 turns activity    Assist        Assist Level: Dependent - Patient 0%   Wheelchair 150 feet activity     Assist  Wheelchair 150 feet activity did not occur: Safety/medical concerns   Assist Level: Dependent - Patient 0%    Medical Problem List and Plan: 1.  Cervical myelopathy- incopletel quadriplegia-  s/p C3-4 ACDF on 01/01/21  Con't PT and OT/CIR_ WBAT on RUE- for shoulder dislocation-   10/5- con't PT and OT- change d/c to 01/30/21 so can go home closer to mod I- family cannot provide more care. Needs bidet!  -Continue CIR therapies including PT, OT- d/c 10/12 2.  Impaired mobility:  -DVT/anticoagulation:  Pharmaceutical: Heparin--now d/ced Was on Eliquis PTA--held due to surgery--> resumed on 9/25             -antiplatelet therapy: N/A 3. Endstate OA left hip/Pain Management: Oxycodone prn             Continue voltaren gel to left hip QID             Xray showing left hip OA  9/28- pt needs R shoulder reverse shoulder arthroplasty- MRI and CT done- con't pain regimen for now.   9/29- also needs L hip replacement long term  10/7-increased hip pain, so will add tramadol 50 mg q6 hours prn for severe pain.  10/8 tramadol seems to have helped 4. Mood: LCSW to follow for evaluation and support.              -antipsychotic agents: N/A 5. Neuropsych: This patient is capable of making decisions on his own behalf. 6. Bilateral knee abraisons/cervical insicion:  Routine pressure relief measures--monitor incision for healing.              --D/ced bacitracin ointement Continue Aquacell with dry dressing to  bilateral knee abrasions.  7. Fluids/Electrolytes/Nutrition: Monitor I/Os.  8. CAF: Monitor HR TID--continue to hold eliquis till 9/23             --On coreg BID.   9/27- change to daily weights  9/28- weight stable to lower- con't regimen  10/2- weight up slightly, but back to baseline- con't to monitor  10/5- Weight up to 85.9 kg- will recheck in AM and if still rising, will need to go from there.   10/4- weight up- Lasix was held- will make sure nursing gives- since weight up so much.   10/5- weight back down 1 kg- will con't Lasix- but change parameters to hold if <100/60.   10/8- weight about the same- wil con't Lasix unless BP <277 systolic. Filed Weights   01/24/21 0328 01/25/21 0536 01/26/21 0344  Weight: 87.1  kg 86.6 kg 86.5 kg  9.  Dehydration: Encourage fluid intake. Monitor orthostatic symptoms  9/30- Pt's BUN up to 26- will give IVFs x 1 day 75cc/hour- and push fluids- c/o being weak- might need U/A and Cx if has fever?  10/3- BUN up to 28- is more dehydrated, but feeling OK- will monitor and give more IVFs if needed- can d/c IV for now.  10. Hypoxia/ Productive cough: Will down grade diet to dysphagia 3 with aspiration precautions/supervison             --has been cleared to remove collar for meals.              --CXR showing atelectasis  Wean off supplemental oxygen  10/3- off O2  10/5- Con't ICS and push usage- if doesn't improve, will check CXR.   10/8 clear this am- will con't ICS 11.  Prediabetes diet controlled?: Has been off meds for 5 years.  Hemoglobin A1c 5.7 on 9/16 Relatively controlled on 9/21 10/5- will stop Insulin and change CBGs to BID before meals.  Monitor with increased mobility 12. HTN: Monitor BP TID--continue Lasix and Cozaar. BP currently soft but as expected for SCI Vitals:   01/26/21 0344 01/26/21 0758  BP: 124/61 (!) 106/55  Pulse: 69 73  Resp: 16   Temp: 97.7 F (36.5 C)   SpO2: 93% 99%  Controlled monitor for orthostatic changes  10/8-  BP doing better- con't regimen 13.  Macrocytic anemia:   Folate within normal limits on 9/16 Hemoglobin 11.5 on 9/19 14. OSA: Does not use CPAP. 15. Constipation now with loose stools and neurogenic bowel: Has not had BM for a couple of days             Added Senna S.   MiraLAX started on 9/16  10/3- was having loose stools, but doing better- will monitor  10/4- will stop Miralax and make prn  10/6- doing bowel program- con't regimen 16. Overweight BMI 28.82: provide dietary education 17.  Hypokalemia  Potassium 3.9 on 9/19, plan to repeat labs on Friday  Supplemented increased on 9/16  18.  Hypoalbuminemia  Supplement initiated on 9/16 19.  Right> Left shoulder abd limitation with ant subluxation deformity on RIght side which appears chronic . Pt denies hx of shoulder surgery.  No pain with ROM, no xrays in Epic will order shoulder 2 V RIght   9/27- called Ortho about shoulder dislocation on R- will get CT and MRI- needs reverse shoulder surgery done eventually- can WBAT and no limitation on fROM per Ortho  9/28- MRI and CT done for surgical planning- torn and retracted biceps tendon; retracted rotator cuff tear.  20. Cough  9/28- asked pt to use ICS q1 hour while awake.   9/29- sounds MUCH better- con't ICS while awake  10/5- encourage ICS usage-  21. Mild LE edema  9/29- added TEDs when up.  22. Generalized weakness  9/30- feeling weak this AM- gave IVFs and might need U/A and Cx.   10/2- resolved- con't to monitor 23. Dispo  10/7- spoke with son - pt needs bidet! Went over this precisely with son during family training.   LOS: 23 days A FACE TO [place] 01/26/2021, 10:23 AM

## 2021-01-27 LAB — GLUCOSE, CAPILLARY
Glucose-Capillary: 101 mg/dL — ABNORMAL HIGH (ref 70–99)
Glucose-Capillary: 93 mg/dL (ref 70–99)

## 2021-01-27 NOTE — Progress Notes (Signed)
Paient requested dulcolax at 8pm both yesterday and today.

## 2021-01-27 NOTE — Progress Notes (Signed)
Slept good. PRN ultram and trazodone 50mg 's given at 2122. BLE's with pitting edema. Using urinal, occasionally spills R/T decreased dexterity to hands, right worse than left. Decreased ROM to BUE's-right > left. Declined dulcolax supp., reports having BM with OT. Calls for assistance, no unsafe behaviors noted. Patrici Ranks A

## 2021-01-28 LAB — CBC WITH DIFFERENTIAL/PLATELET
Abs Immature Granulocytes: 0.12 10*3/uL — ABNORMAL HIGH (ref 0.00–0.07)
Basophils Absolute: 0.1 10*3/uL (ref 0.0–0.1)
Basophils Relative: 1 %
Eosinophils Absolute: 0.2 10*3/uL (ref 0.0–0.5)
Eosinophils Relative: 2 %
HCT: 40.3 % (ref 39.0–52.0)
Hemoglobin: 13.1 g/dL (ref 13.0–17.0)
Immature Granulocytes: 1 %
Lymphocytes Relative: 22 %
Lymphs Abs: 2.2 10*3/uL (ref 0.7–4.0)
MCH: 33.9 pg (ref 26.0–34.0)
MCHC: 32.5 g/dL (ref 30.0–36.0)
MCV: 104.1 fL — ABNORMAL HIGH (ref 80.0–100.0)
Monocytes Absolute: 0.7 10*3/uL (ref 0.1–1.0)
Monocytes Relative: 7 %
Neutro Abs: 6.4 10*3/uL (ref 1.7–7.7)
Neutrophils Relative %: 67 %
Platelets: 272 10*3/uL (ref 150–400)
RBC: 3.87 MIL/uL — ABNORMAL LOW (ref 4.22–5.81)
RDW: 15 % (ref 11.5–15.5)
WBC: 9.6 10*3/uL (ref 4.0–10.5)
nRBC: 0 % (ref 0.0–0.2)

## 2021-01-28 LAB — BASIC METABOLIC PANEL
Anion gap: 7 (ref 5–15)
BUN: 22 mg/dL (ref 8–23)
CO2: 34 mmol/L — ABNORMAL HIGH (ref 22–32)
Calcium: 9.4 mg/dL (ref 8.9–10.3)
Chloride: 100 mmol/L (ref 98–111)
Creatinine, Ser: 0.89 mg/dL (ref 0.61–1.24)
GFR, Estimated: >60 mL/min (ref >60)
Glucose, Bld: 129 mg/dL — ABNORMAL HIGH (ref 70–99)
Potassium: 3.9 mmol/L (ref 3.5–5.1)
Sodium: 141 mmol/L (ref 135–145)

## 2021-01-28 LAB — GLUCOSE, CAPILLARY
Glucose-Capillary: 95 mg/dL (ref 70–99)
Glucose-Capillary: 102 mg/dL — ABNORMAL HIGH (ref 70–99)

## 2021-01-28 MED ORDER — SIMETHICONE 80 MG PO CHEW
160.0000 mg | CHEWABLE_TABLET | Freq: Four times a day (QID) | ORAL | Status: DC
  Administered 2021-01-28 – 2021-01-30 (×8): 160 mg via ORAL
  Filled 2021-01-28 (×8): qty 2

## 2021-01-28 MED ORDER — DIPHENOXYLATE-ATROPINE 2.5-0.025 MG PO TABS: 1.0000 | ORAL_TABLET | Freq: Every day | ORAL | Status: DC | PRN

## 2021-01-28 NOTE — Progress Notes (Signed)
Slept good. Sits up in wheelchair until 2300. At HS up to BR, moderate BM, refused dulcolax supp. Seems to loosen collar. Educated patient on keeping C-collar tighter and to call staff if it seems loose. Reports feeling like he can't breath when it's too tight. Bilateral foam dressings to knees and to top of right foot. Didn't want to wear SCD's tonight. PRN ultram and trazodone 50mg 's given at 2122. PRN ultram given again at 0508. Complains of right shoulder pain. Logan [name]

## 2021-01-28 NOTE — Progress Notes (Signed)
Inpatient Rehabilitation Care Coordinator Discharge Note   Patient Details  Name: Logan [name] MRN: [id number] Date of Birth: November 28, 1945   Discharge location: HOME WITH SON'S CHECKING ON DAILY  Length of Stay:  27 DAYS  Discharge activity level: SUPERVISION-MOD/I LEVEL  Home/community participation: ACTIVE  Patient response OI:NOMVEH Literacy - How often do you need to have someone help you when you read instructions, pamphlets, or other written material from your doctor or pharmacy?: Never  Patient response MC:NOBSJG Isolation - How often do you feel lonely or isolated from those around you?: Never  Services provided included: MD, RD, PT, OT, SLP, RN, CM, TR, Pharmacy, SW  Financial Services:  Charity fundraiser Utilized: Environmental education officer MEDICARE  Choices offered to/list presented to: PT AND SON  Follow-up services arranged:  Home Health, DME, Patient/Family has no preference for HH/DME agencies [place]    DME : [facility]-3 IN 1 AND WHEELCHAIR    Patient response to transportation need: Is the patient able to respond to transportation needs?: Yes In the past 12 months, has lack of transportation kept you from medical appointments or from getting medications?: No In the past 12 months, has lack of transportation kept you from meetings, work, or from getting things needed for daily living?: No    Comments (or additional information):SON WAS HERE FOR EDUCATION AND IT WENT WELL. FEEL PREPARED FOR DISCHARGE. PT DID WELL AND WILL BE SAFE HOME ALONE WHEN SON'S WORKING  Patient/Family verbalized understanding of follow-up arrangements:  Yes  Individual responsible for coordination of the follow-up plan: DON-JR [phone]  Confirmed correct DME delivered: Elease Hashimoto 01/28/2021    Elease Hashimoto

## 2021-01-28 NOTE — Progress Notes (Signed)
Physical Therapy Session Note  Patient Details  Name: Logan [name] MRN: [id number] Date of Birth: Jan 05, 1946  Today's Date: 01/28/2021 PT Individual Time: 0800-0853 PT Individual Time Calculation (min): 53 min   Short Term Goals: Week 1:  PT Short Term Goal 1 (Week 1): Pt will perform supine to sit w/mod assist and LRAD PT Short Term Goal 1 - Progress (Week 1): Met PT Short Term Goal 2 (Week 1): Pt will transfer bed to/from wc w/mod assist and LRAD PT Short Term Goal 2 - Progress (Week 1): Met PT Short Term Goal 3 (Week 1): Pt will ambulate 96f w/min assist and LRAD PT Short Term Goal 3 - Progress (Week 1): Met Week 2:  PT Short Term Goal 1 (Week 2): =LTGs d/t ELOS PT Short Term Goal 1 - Progress (Week 2): Met  Skilled Therapeutic Interventions/Progress Updates:    Pt seated in w/c on arrival and agreeable to therapy. Pt reports some shoulder pain at rest, unrated. Addressed with positioning as needed. Nsg present on arrival for meds pass, MD in/out for morning rounds. Session focused on bed mobility problem solving for d/c. Pt transported to ADL apartment for time management and energy conservation. Sit to stand with CGA and gait with supervision and RW throughout session. Pt required mod A for bed mobility on ADL apartment bed, stating his bed at home is much firmer. Pt then performed bed mobility with supervision on cushioned mat table. Pt demoed poor adherence to cervical precautions. Discussed options for home, pt states he has lift chair and has slept in it before. Pt required mod A to stand from ADL apartment recliner, states his at home is much higher. Pt returned to room after session and remained in w/c, was left with all needs in reach and alarm active.   Therapy Documentation Precautions:  Precautions Precautions: Fall, Cervical Required Braces or Orthoses: Cervical Brace Cervical Brace: Hard collar, At all times, Other (comment) Restrictions Weight Bearing Restrictions:  No   Therapy/Group: Individual Therapy  OMickel Fuchs10/01/2021, 8:58 AM

## 2021-01-28 NOTE — Progress Notes (Signed)
Educated pt regarding suppository. Pt agreed to suppository. Toileted pt, only gas but pt reports that has had no gas today and has greatly improved.

## 2021-01-28 NOTE — Progress Notes (Signed)
Patient ID: Logan [name], male   DOB: 1945/07/23, 75 y.o.   MRN: [id number]  Met with pt to ask about the [place] suction if needed at home. He reports he can spit and does not feel he needs this at home. Preparing for discharge Wed.

## 2021-01-28 NOTE — Progress Notes (Signed)
[place] PHYSICAL MEDICINE & REHABILITATION PROGRESS NOTE  Subjective/Complaints:  Pt reports to me pain controlled, but tells others it's just tolerable.   LBM last night- had loose stools- even spite of being off all bowel meds.  Having a lot of gas- wants something for this.  Didn't refuse suppository last night- was told "didn't need it"- but per day nurse requested it.  Coughing less/not at all when uses ICS a lot.    ROS:  Pt denies SOB, abd pain, CP, N/V/C/D, and vision changes    Objective: Vital Signs: Blood pressure 130/70, pulse 70, temperature 98.4 F (36.9 C), resp. rate 18, height 5\' 7"  (1.702 m), weight 89.6 kg, SpO2 98 %. No results found. Recent Labs    01/28/21 0741  WBC 9.6  HGB 13.1  HCT 40.3  PLT 272     Recent Labs    01/28/21 0741  NA 141  K 3.9  CL 100  CO2 34*  GLUCOSE 129*  BUN 22  CREATININE 0.89  CALCIUM 9.4      Intake/Output Summary (Last 24 hours) at [name] 1840 Last data filed at [name] 1742 Gross per 24 hour  Intake 780 ml  Output 500 ml  Net 280 ml        Physical Exam: BP 130/70 (BP Location: Left Arm)   Pulse 70   Temp 98.4 F (36.9 C)   Resp 18   Ht 5\' 7"  (1.702 m)   Wt 89.6 kg   SpO2 98%   BMI 30.94 kg/m     General: awake, alert, appropriate, sitting up in w/c with nurse there; wearing Cervical collar; NAD HENT: conjugate gaze; oropharynx dry CV: regular rate; no JVD Pulmonary: CTA B/L; no W/R/R- good air movement GI: soft, NT, appears stable protuberant; hyperactive slightly Psychiatric: appropriate Neurological: Ox3; alert      Ext: no clubbing, cyanosis, or edema Psych: pleasant and cooperative   MS: R anterior shoulder dislocation- chronic Sensation intact to LT in BUE Fine motor able to oppose finger to thumb in BUEs Unable to lift RUE against gravity- has what appears to be R shoulder anteriorly dislocated on exam---chronic  Motor: Bilateral upper extremities: Shoulder  abduction 2/5, elbow flexion3+ /extension 2+/5, handgrip 4/5, right weaker than left, stable Bilateral lower extremities: 4-/5 proximal distal Extremities: 1+ LE edema to lower calves B/L ongoing   Assessment/Plan: 1. Functional deficits which require 3+ hours per day of interdisciplinary therapy in a comprehensive inpatient rehab setting. Physiatrist is providing close team supervision and 24 hour management of active medical problems listed below. Physiatrist and rehab team continue to assess barriers to discharge/monitor patient progress toward functional and medical goals   Care Tool:  Bathing    Body parts bathed by patient: Right arm, Left arm, Chest, Abdomen, Front perineal area, Right upper leg, Left upper leg, Right lower leg, Left lower leg, Buttocks   Body parts bathed by helper: Face, Buttocks     Bathing assist Assist Level: Minimal Assistance - Patient > 75%     Upper Body Dressing/Undressing Upper body dressing   What is the patient wearing?: Button up shirt    Upper body assist Assist Level: Minimal Assistance - Patient > 75%    Lower Body Dressing/Undressing Lower body dressing      What is the patient wearing?: Pants, Incontinence brief     Lower body assist Assist for lower body dressing: Minimal Assistance - Patient > 75%     Toileting Toileting  Toileting assist Assist for toileting: Minimal Assistance - Patient > 75%     Transfers Chair/bed transfer  Transfers assist     Chair/bed transfer assist level: Supervision/Verbal cueing Chair/bed transfer assistive device: Programmer, multimedia   Ambulation assist      Assist level: Supervision/Verbal cueing Assistive device: Walker-rolling Max distance: 200   Walk 10 feet activity   Assist     Assist level: Supervision/Verbal cueing Assistive device: Walker-rolling   Walk 50 feet activity   Assist Walk 50 feet with 2 turns activity did not occur: Safety/medical  concerns  Assist level: Supervision/Verbal cueing Assistive device: Walker-rolling    Walk 150 feet activity   Assist Walk 150 feet activity did not occur: Safety/medical concerns  Assist level: Supervision/Verbal cueing Assistive device: Walker-rolling    Walk 10 feet on uneven surface  activity   Assist Walk 10 feet on uneven surfaces activity did not occur: Safety/medical concerns         Wheelchair     Assist Is the patient using a wheelchair?: Yes Type of Wheelchair: Manual    Wheelchair assist level: Dependent - Patient 0% Max wheelchair distance: 136 ft    Wheelchair 50 feet with 2 turns activity    Assist        Assist Level: Dependent - Patient 0%   Wheelchair 150 feet activity     Assist  Wheelchair 150 feet activity did not occur: Safety/medical concerns   Assist Level: Dependent - Patient 0%    Medical Problem List and Plan: 1.  Cervical myelopathy- incopletel quadriplegia-  s/p C3-4 ACDF on 01/01/21  Con't PT and OT/CIR_ WBAT on RUE- for shoulder dislocation-   10/5- con't PT and OT- change d/c to 01/30/21 so can go home closer to mod I- family cannot provide more care. Needs bidet!  -con't PT and OT- d/c 10/12- family training done Friday.  2.  Impaired mobility:  -DVT/anticoagulation:  Pharmaceutical: Heparin--now d/ced Was on Eliquis PTA--held due to surgery--> resumed on 9/25             -antiplatelet therapy: N/A 3. Endstate OA left hip/Pain Management: Oxycodone prn             Continue voltaren gel to left hip QID             Xray showing left hip OA  9/28- pt needs R shoulder reverse shoulder arthroplasty- MRI and CT done- con't pain regimen for now.   9/29- also needs L hip replacement long term  10/7-increased hip pain, so will add tramadol 50 mg q6 hours prn for severe pain.   10/10- pain tolerable- con't regimen prn 10/8 tramadol seems to have helped 4. Mood: LCSW to follow for evaluation and support.               -antipsychotic agents: N/A 5. Neuropsych: This patient is capable of making decisions on his own behalf. 6. Bilateral knee abraisons/cervical insicion:  Routine pressure relief measures--monitor incision for healing.              --D/ced bacitracin ointement Continue Aquacell with dry dressing to bilateral knee abrasions.   10/10- aquacel off 1 knee and just on 1 knee now- con't wound care 7. Fluids/Electrolytes/Nutrition: Monitor I/Os.  8. CAF: Monitor HR TID--continue to hold eliquis till 9/23             --On coreg BID.   9/27- change to daily weights  9/28- weight stable to lower-  con't regimen  10/2- weight up slightly, but back to baseline- con't to monitor  10/5- Weight up to 85.9 kg- will recheck in AM and if still rising, will need to go from there.   10/4- weight up- Lasix was held- will make sure nursing gives- since weight up so much.   10/5- weight back down 1 kg- will con't Lasix- but change parameters to hold if <100/60.   10/8- weight about the same- wil con't Lasix unless BP <546 systolic.  10/10- pt weight up more to 89 kg- was 86 kg and that was up more- will check pro BNP in am [facility] Weights   01/26/21 0344 01/27/21 0348 01/28/21 0454  Weight: 86.5 kg 88.1 kg 89.6 kg  9.  Dehydration: Encourage fluid intake. Monitor orthostatic symptoms  9/30- Pt's BUN up to 26- will give IVFs x 1 day 75cc/hour- and push fluids- c/o being weak- might need U/A and Cx if has fever?  10/3- BUN up to 28- is more dehydrated, but feeling OK- will monitor and give more IVFs if needed- can d/c IV for now.   10/10- BUN down to 22 and Cr 0.89 10. Hypoxia/ Productive cough: Will down grade diet to dysphagia 3 with aspiration precautions/supervison             --has been cleared to remove collar for meals.              --CXR showing atelectasis  Wean off supplemental oxygen  10/3- off O2  10/5- Con't ICS and push usage- if doesn't improve, will check CXR.   10/8 clear this am- will con't  ICS 11.  Prediabetes diet controlled?: Has been off meds for 5 years.  Hemoglobin A1c 5.7 on 9/16 Relatively controlled on 9/21 10/5- will stop Insulin and change CBGs to BID before meals.  Monitor with increased mobility 12. HTN: Monitor BP TID--continue Lasix and Cozaar. BP currently soft but as expected for SCI Vitals:   01/28/21 1534 01/28/21 1739  BP: 131/67 130/70  Pulse: 70   Resp: 18   Temp: 98.4 F (36.9 C)   SpO2: 98%   Controlled monitor for orthostatic changes  10/8- BP doing better- con't regimen 13.  Macrocytic anemia:   Folate within normal limits on 9/16 Hemoglobin 11.5 on 9/19 14. OSA: Does not use CPAP. 15. Constipation now with loose stools and neurogenic bowel: Has not had BM for a couple of days             Added Senna S.   MiraLAX started on 9/16  10/3- was having loose stools, but doing better- will monitor  10/4- will stop Miralax and make prn  10/6- doing bowel program- con't regimen  10/10- will add lomotil daily prn but still having loose stools with bowel program.  16. Overweight BMI 28.82: provide dietary education 17.  Hypokalemia  Potassium 3.9 on 9/19, plan to repeat labs on Friday  Supplemented increased on 9/16  18.  Hypoalbuminemia  Supplement initiated on 9/16 19.  Right> Left shoulder abd limitation with ant subluxation deformity on RIght side which appears chronic . Pt denies hx of shoulder surgery.  No pain with ROM, no xrays in Epic will order shoulder 2 V RIght   9/27- called Ortho about shoulder dislocation on R- will get CT and MRI- needs reverse shoulder surgery done eventually- can WBAT and no limitation on fROM per Ortho  9/28- MRI and CT done for surgical planning- torn and retracted biceps tendon; retracted  rotator cuff tear.  20. Cough  9/28- asked pt to use ICS q1 hour while awake.   9/29- sounds MUCH better- con't ICS while awake  10/5- encourage ICS usage-  21. Mild LE edema  9/29- added TEDs when up.  22. Generalized  weakness  9/30- feeling weak this AM- gave IVFs and might need U/A and Cx.   10/2- resolved- con't to monitor 23. Dispo  10/7- spoke with son - pt needs bidet! Went over this precisely with son during family training.   LOS: 25 days A FACE TO FACE EVALUATION WAS PERFORMED  Logan [name] Logan [name], 6:40 PM

## 2021-01-28 NOTE — Progress Notes (Signed)
Occupational Therapy Session Note  Patient Details  Name: Logan [name] MRN: [id number] Date of Birth: 01-01-1946  Today's Date: 01/28/2021 OT Individual Time: 2542-7062 and 1415-1510 OT Individual Time Calculation (min): 56 min and 55 min   Short Term Goals: Week 2:  OT Short Term Goal 1 (Week 2): Pt will adhere to cervical precautions consistently during ADL w/ no more than Min cues OT Short Term Goal 1 - Progress (Week 2): Met OT Short Term Goal 2 (Week 2): Pt will increase Right shoulder scaption to 50* to improve functional skill performance OT Short Term Goal 2 - Progress (Week 2): Not met OT Short Term Goal 3 (Week 2): Pt will perform BSC/toilet transfers with LRAD and Supervision OT Short Term Goal 3 - Progress (Week 2): Progressing toward goal OT Short Term Goal 4 (Week 2): Pt will perform LB dress with AE PRN with Min A OT Short Term Goal 4 - Progress (Week 2): Progressing toward goal Week 3:  OT Short Term Goal 1 (Week 3): STGs = LTGs at Min/Mod   Skilled Therapeutic Interventions/Progress Updates:    Session 1: Pt greeted at time of session up in wheelchair no pain, agreeable to OT session. Pt wanting to shave, removed collar and with cues to maintain precautions OT assisted with shaving for ADL task. Pt actively participating in removing collar (with strap add on for additional length) with Supervision which is an improvement. Hand over hand and Min distal support to assist pt in washing face s/p shaving tasks. Discussion throughout session regarding DC planning, DME that son has ordered, and assist at home. Transported to gym for remainder of session to focus on RUE/LUE NMR and functional use with copying block pattern for 3 rounds in standing, at times standing without UE support and using both Ue's interchangeably. Pt needing to have BM at this time, transported back to room and stand pivot CGA w/c <> BSC, MIN A for toileting tasks. Back up in chair alarm on call bell in reach.     Session 2: Pt greeted at time of session sitting up in wheelchair, no pain, agreeable to OT session focused on shower level bathing. Wheelchair transport to bathroom and stand pivot chair > TTB CGA/close supervision with RW. Once seated, noted to have urgency and transferred bench <> BSC over toilet with CGA and had incontinent/continent void for BM. Hygiene in standing CGA, doffed LB clothing at this time before going back to bench. UB/LB bathing with Min A overall for face and thoroughly at buttocks, note pt able to use LHS for feet/past knee level and partially for face as well. Recommended use additional LHS for face/UB and different once for LB. Note additional unused LHS in room with pt planning to use this at home. C collar worn throughout shower, changed pads seated on bench while drying off. UB dress Min A with pt able to snap buttons, LB dress Min A as well for brief only. Stand pivot back to wheelchair CGA and set up alarm on call bell in reach.   Therapy Documentation Precautions:  Precautions Precautions: Fall, Cervical Required Braces or Orthoses: Cervical Brace Cervical Brace: Hard collar, At all times, Other (comment) Restrictions Weight Bearing Restrictions: No    Therapy/Group: Individual Therapy  Viona Gilmore 01/28/2021, 7:20 AM

## 2021-01-29 LAB — GLUCOSE, CAPILLARY
Glucose-Capillary: 104 mg/dL — ABNORMAL HIGH (ref 70–99)
Glucose-Capillary: 133 mg/dL — ABNORMAL HIGH (ref 70–99)

## 2021-01-29 LAB — BRAIN NATRIURETIC PEPTIDE: B Natriuretic Peptide: 88.1 pg/mL (ref 0.0–100.0)

## 2021-01-29 NOTE — Progress Notes (Signed)
[place] PHYSICAL MEDICINE & REHABILITATION PROGRESS NOTE  Subjective/Complaints:  Gas greatly improved per pt.  Pt still having bowel urgency- when has to go- has to go immediately- had accident as a result this AM  However, per OT, wiping and toileting a LOT better- supervision to [place].   BNP was 88- so not fluid overloaded.  ROS:   Pt denies SOB, abd pain, CP, N/V/C/D, and vision changes   Objective: Vital Signs: Blood pressure 118/66, pulse (!) 57, temperature 97.8 F (36.6 C), temperature source Oral, resp. rate 18, height 5\' 7"  (1.702 m), weight 89.6 kg, SpO2 96 %. No results found. Recent Labs    01/28/21 0741  WBC 9.6  HGB 13.1  HCT 40.3  PLT 272     Recent Labs    01/28/21 0741  NA 141  K 3.9  CL 100  CO2 34*  GLUCOSE 129*  BUN 22  CREATININE 0.89  CALCIUM 9.4      Intake/Output Summary (Last 24 hours) at 01/29/2021 1024 Last data filed at 01/29/2021 0700 Gross per 24 hour  Intake 920 ml  Output 500 ml  Net 420 ml        Physical Exam: BP 118/66 (BP Location: Left Arm)   Pulse (!) 57   Temp 97.8 F (36.6 C) (Oral)   Resp 18   Ht 5\' 7"  (1.702 m)   Wt 89.6 kg   SpO2 96%   BMI 30.94 kg/m      General: awake, alert, appropriate,cervical collar in place with OT doing bathroom training;  NAD HENT: conjugate gaze; oropharynx moist CV: regular rate; no JVD Pulmonary: CTA B/L; no W/R/R- good air movement GI: soft, NT, ND, (+)BS Psychiatric: appropriate Neurological: Ox3  Ext: no clubbing, cyanosis, or edema Psych: pleasant and cooperative   MS: R anterior shoulder dislocation- chronic Sensation intact to LT in BUE Fine motor able to oppose finger to thumb in BUEs Unable to lift RUE against gravity- has what appears to be R shoulder anteriorly dislocated on exam---chronic  Motor: Bilateral upper extremities: Shoulder abduction 2/5, elbow flexion3+ /extension 2+/5, handgrip 4/5, right weaker than left, stable Bilateral lower  extremities: 4-/5 proximal distal Extremities: 1+ LE edema to lower calves B/L ongoing   Assessment/Plan: 1. Functional deficits which require 3+ hours per day of interdisciplinary therapy in a comprehensive inpatient rehab setting. Physiatrist is providing close team supervision and 24 hour management of active medical problems listed below. Physiatrist and rehab team continue to assess barriers to discharge/monitor patient progress toward functional and medical goals   Care Tool:  Bathing    Body parts bathed by patient: Right arm, Left arm, Chest, Abdomen, Front perineal area, Right upper leg, Left upper leg, Right lower leg, Left lower leg, Buttocks   Body parts bathed by helper: Face, Buttocks     Bathing assist Assist Level: Minimal Assistance - Patient > 75%     Upper Body Dressing/Undressing Upper body dressing   What is the patient wearing?: Button up shirt    Upper body assist Assist Level: Minimal Assistance - Patient > 75%    Lower Body Dressing/Undressing Lower body dressing      What is the patient wearing?: Pants, Incontinence brief     Lower body assist Assist for lower body dressing: Minimal Assistance - Patient > 75%     Toileting Toileting    Toileting assist Assist for toileting: Minimal Assistance - Patient > 75%     Transfers Chair/bed transfer  Transfers  assist     Chair/bed transfer assist level: Supervision/Verbal cueing Chair/bed transfer assistive device: Museum/gallery exhibitions officer assist      Assist level: Supervision/Verbal cueing Assistive device: Walker-rolling Max distance: 200   Walk 10 feet activity   Assist     Assist level: Supervision/Verbal cueing Assistive device: Walker-rolling   Walk 50 feet activity   Assist Walk 50 feet with 2 turns activity did not occur: Safety/medical concerns  Assist level: Supervision/Verbal cueing Assistive device: Walker-rolling    Walk 150 feet  activity   Assist Walk 150 feet activity did not occur: Safety/medical concerns  Assist level: Supervision/Verbal cueing Assistive device: Walker-rolling    Walk 10 feet on uneven surface  activity   Assist Walk 10 feet on uneven surfaces activity did not occur: Safety/medical concerns         Wheelchair     Assist Is the patient using a wheelchair?: Yes Type of Wheelchair: Manual    Wheelchair assist level: Dependent - Patient 0% Max wheelchair distance: 136 ft    Wheelchair 50 feet with 2 turns activity    Assist        Assist Level: Dependent - Patient 0%   Wheelchair 150 feet activity     Assist  Wheelchair 150 feet activity did not occur: Safety/medical concerns   Assist Level: Dependent - Patient 0%    Medical Problem List and Plan: 1.  Cervical myelopathy- incopletel quadriplegia-  s/p C3-4 ACDF on 01/01/21  Con't PT and OT/CIR_ WBAT on RUE- for shoulder dislocation-   10/5- con't PT and OT- change d/c to 01/30/21 so can go home closer to mod I- family cannot provide more care. Needs bidet!  Con't PT and OT_ team conference today to finalize d/c tomorrow.  2.  Impaired mobility:  -DVT/anticoagulation:  Pharmaceutical: Heparin--now d/ced Was on Eliquis PTA--held due to surgery--> resumed on 9/25             -antiplatelet therapy: N/A 3. Endstate OA left hip/Pain Management: Oxycodone prn             Continue voltaren gel to left hip QID             Xray showing left hip OA  9/28- pt needs R shoulder reverse shoulder arthroplasty- MRI and CT done- con't pain regimen for now.   9/29- also needs L hip replacement long term  10/7-increased hip pain, so will add tramadol 50 mg q6 hours prn for severe pain.   10/11- will send home on tramadol prn. Pain is much better 4. Mood: LCSW to follow for evaluation and support.              -antipsychotic agents: N/A 5. Neuropsych: This patient is capable of making decisions on his own behalf. 6.  Bilateral knee abraisons/cervical insicion:  Routine pressure relief measures--monitor incision for healing.              --D/ced bacitracin ointement Continue Aquacell with dry dressing to bilateral knee abrasions.   10/10- aquacel off 1 knee and just on 1 knee now- con't wound care 7. Fluids/Electrolytes/Nutrition: Monitor I/Os.  8. CAF: Monitor HR TID--continue to hold eliquis till 9/23             --On coreg BID.   9/27- change to daily weights  9/28- weight stable to lower- con't regimen  10/2- weight up slightly, but back to baseline- con't to monitor  10/5- Weight up to 85.9  kg- will recheck in AM and if still rising, will need to go from there.   10/4- weight up- Lasix was held- will make sure nursing gives- since weight up so much.   10/5- weight back down 1 kg- will con't Lasix- but change parameters to hold if <100/60.   10/8- weight about the same- wil con't Lasix unless BP <644 systolic.  10/10- pt weight up more to 89 kg- was 86 kg and that was up more- will check pro BNP in am  10/11- pt's BNP was 88, so not fluid overloaded- will monitor Filed Weights   01/26/21 0344 01/27/21 0348 01/28/21 0454  Weight: 86.5 kg 88.1 kg 89.6 kg  9.  Dehydration: Encourage fluid intake. Monitor orthostatic symptoms  9/30- Pt's BUN up to 26- will give IVFs x 1 day 75cc/hour- and push fluids- c/o being weak- might need U/A and Cx if has fever?  10/3- BUN up to 28- is more dehydrated, but feeling OK- will monitor and give more IVFs if needed- can d/c IV for now.   10/10- BUN down to 22 and Cr 0.89 10. Hypoxia/ Productive cough: Will down grade diet to dysphagia 3 with aspiration precautions/supervison             --has been cleared to remove collar for meals.              --CXR showing atelectasis  Wean off supplemental oxygen  10/3- off O2  10/5- Con't ICS and push usage- if doesn't improve, will check CXR.   10/8 clear this am- will con't ICS 11.  Prediabetes diet controlled?: Has been off  meds for 5 years.  Hemoglobin A1c 5.7 on 9/16 Relatively controlled on 9/21 10/5- will stop Insulin and change CBGs to BID before meals.  Monitor with increased mobility 12. HTN: Monitor BP TID--continue Lasix and Cozaar. BP currently soft but as expected for SCI Vitals:   01/28/21 2112 01/29/21 0447  BP: (!) 103/46 118/66  Pulse: 70 (!) 57  Resp: 18 18  Temp: 98.4 F (36.9 C) 97.8 F (36.6 C)  SpO2: 96% 96%  Controlled monitor for orthostatic changes  10/8- BP doing better- con't regimen 13.  Macrocytic anemia:   Folate within normal limits on 9/16 Hemoglobin 11.5 on 9/19 14. OSA: Does not use CPAP. 15. Constipation now with loose stools and neurogenic bowel: Has not had BM for a couple of days             Added Senna S.   MiraLAX started on 9/16  10/3- was having loose stools, but doing better- will monitor  10/4- will stop Miralax and make prn  10/6- doing bowel program- con't regimen  10/10- will add lomotil daily prn but still having loose stools with bowel program 10/11- bowels- doing much better with toileting!.  16. Overweight BMI 28.82: provide dietary education 17.  Hypokalemia  Potassium 3.9 on 9/19, plan to repeat labs on Friday  Supplemented increased on 9/16  18.  Hypoalbuminemia  Supplement initiated on 9/16 19.  Right> Left shoulder abd limitation with ant subluxation deformity on RIght side which appears chronic . Pt denies hx of shoulder surgery.  No pain with ROM, no xrays in Epic will order shoulder 2 V RIght   9/27- called Ortho about shoulder dislocation on R- will get CT and MRI- needs reverse shoulder surgery done eventually- can WBAT and no limitation on fROM per Ortho  9/28- MRI and CT done for surgical planning- torn and  retracted biceps tendon; retracted rotator cuff tear.  20. Cough  9/28- asked pt to use ICS q1 hour while awake.   9/29- sounds MUCH better- con't ICS while awake  10/5- encourage ICS usage-  21. Mild LE edema  9/29- added TEDs  when up.  22. Generalized weakness  9/30- feeling weak this AM- gave IVFs and might need U/A and Cx.   10/2- resolved- con't to monitor 23. Dispo  10/7- spoke with son - pt needs bidet! Went over this precisely with son during family training.   10/11- d/c tomorrow  LOS: 26 days A FACE TO FACE EVALUATION WAS PERFORMED  Merryl Buckels 01/29/2021, 10:24 AM

## 2021-01-29 NOTE — Progress Notes (Signed)
Occupational Therapy Session Note  Patient Details  Name: Logan [name] MRN: [id number] Date of Birth: [birth year]  Today's Date: 01/29/2021 OT Individual Time: 1132-1201 OT Individual Time Calculation (min): 29 min    Short Term Goals: Week 3:  OT Short Term Goal 1 (Week 3): STGs = LTGs at Min/Mod  Skilled Therapeutic Interventions/Progress Updates:    Pt received seated in w/c, no c/o pain, agreeable to therapy. Session focus on activity tolerance, BUE NMR in prep for improved ADL/IADL/func mobility performance + decreased caregiver burden. Total A w/c transport to and from gym for time management and energy conservation. Pt reports no questions/concerns prior to DC. Issued printed HEP for use of medium resistive theraputty to target BUE NMR. Pt reports frequently rolling putty into "hot dog" shape.   Pt practiced placing and removing push pins into design with BUE, required increased time and multiple attempts to push pins fully into cork board.  Pt left seated in w/c with safety belt alarm engaged, call bell in reach, and all immediate needs met.    Therapy Documentation Precautions:  Precautions Precautions: Fall, Cervical Required Braces or Orthoses: Cervical Brace Cervical Brace: Hard collar, At all times, Other (comment) Restrictions Weight Bearing Restrictions: No  Pain: no c/o   ADL: See Care Tool for more details. Therapy/Group: Individual Therapy  Volanda Napoleon MS, OTR/L  01/29/2021, 6:44 AM

## 2021-01-29 NOTE — Progress Notes (Signed)
Physical Therapy Discharge Summary  Patient Details  Name: Logan [name] MRN: [id number] Date of Birth: 09/21/1945  Today's Date: 01/29/2021 PT Individual Time: 1000-1100 PT Individual Time Calculation (min): 60 min    Patient has met 5 of 8 long term goals due to improved activity tolerance, improved balance, improved postural control, increased strength, decreased pain, ability to compensate for deficits, and improved coordination.  Patient to discharge at an ambulatory level Supervision.   Patient's care partner is independent to provide the necessary physical assistance at discharge.  Reasons goals not met: Pt unable to meet mod I goals d/t inconsistent performance and LE weakness.  Recommendation:  Patient will benefit from ongoing skilled PT services in home health setting to continue to advance safe functional mobility, address ongoing impairments in LE strength, functional mobility, and minimize fall risk.  Equipment: W/c  Reasons for discharge: treatment goals met and discharge from hospital  Patient/family agrees with progress made and goals achieved: Yes  Skilled Therapeutic Interventions/Progress Updates:  Pt seated in w/c on arrival and agreeable to therapy. Pt reports minimal-no pain at rest. Pt propelled w/c with BUE x 150 ft before becoming fatigued for endurance and functional mobility. Pt navigated 4" curb x 2 with RW and CGA without VC.  Stair navigation x 4 6" steps with min A. Strength and sensation testing performed in sitting as documented below. Pt able to retrieve washcloth from floor mod I with reacher. Gait 3 x 90 ft with RW and supervision. Pt required CGA at times for Sit to stand, but largely supervision. Pt returned to room and remained in w/c, was left with all needs in reach and alarm active.   PT Discharge Precautions/Restrictions Precautions Precautions: Fall;Cervical Precaution Booklet Issued: No Precaution Comments: pt with verbal  understanding Required Braces or Orthoses: Cervical Brace Cervical Brace: Hard collar;At all times;Other (comment) Restrictions Weight Bearing Restrictions: No Vital Signs   Pain Interference Pain Interference Pain Effect on Sleep: 1. Rarely or not at all Pain Interference with Therapy Activities: 1. Rarely or not at all Pain Interference with Day-to-Day Activities: 1. Rarely or not at all Vision/Perception  Perception Perception: Within Functional Limits Praxis Praxis: Intact  Cognition Overall Cognitive Status: Within Functional Limits for tasks assessed Arousal/Alertness: Awake/alert Orientation Level: Oriented X4 Year: 2022 Month: October Day of Week: Correct Attention: Focused;Sustained;Selective Focused Attention: Appears intact Sustained Attention: Appears intact Selective Attention: Appears intact Memory: Appears intact Awareness: Appears intact Problem Solving: Appears intact Safety/Judgment: Appears intact Sensation Sensation Light Touch: Appears Intact Hot/Cold: Appears Intact Proprioception: Appears Intact Stereognosis: Not tested Coordination Gross Motor Movements are Fluid and Coordinated: No Coordination and Movement Description: Slow speed and global coordination deficits  d/t ROM restrictions and weakness Motor  Motor Motor: Other (comment) Motor - Skilled Clinical Observations: weakness x 4 extremities proximal > distal Motor - Discharge Observations: Functional improvement from baseline  Mobility Bed Mobility Bed Mobility: Left Sidelying to Sit Rolling Right: Contact Guard/Touching assist Right Sidelying to Sit: Moderate Assistance - Patient 50-74% Left Sidelying to Sit: Supervision/Verbal cueing Transfers Transfers: Stand to Sit;Sit to Stand;Stand Pivot Transfers Sit to Stand: Contact Guard/Touching assist Stand to Sit: Contact Guard/Touching assist Stand Pivot Transfers: Supervision/Verbal cueing Stand Pivot Transfer Details: Tactile cues  for initiation;Tactile cues for posture;Tactile cues for placement;Tactile cues for weight shifting;Tactile cues for weight beaing;Verbal cues for gait pattern Transfer (Assistive device): Rolling walker Locomotion  Gait Ambulation: Yes Gait Assistance: Supervision/Verbal cueing Gait Distance (Feet): 200 Feet Assistive device: Rolling walker Gait Assistance Details:  Verbal cues for sequencing;Verbal cues for safe use of DME/AE Gait Gait: Yes Gait Pattern: Impaired Gait Pattern: Left hip hike;Trunk flexed;Decreased step length - right;Decreased stance time - left;Decreased stride length;Step-through pattern Gait velocity: very impaired/slowed Stairs / Additional Locomotion Stairs: Yes Stairs Assistance: Contact Guard/Touching assist Stair Management Technique: Two rails Number of Stairs: 4 Height of Stairs: 6 Ramp: Supervision/Verbal cueing Curb: Contact Guard/Touching assist Pick up small object from the floor assist level: Independent with assistive device Pick up small object from the floor assistive device: reacher and Teaching laboratory technician Mobility: Yes Wheelchair Assistance: Chartered loss adjuster: Both upper extremities Wheelchair Parts Management: Needs assistance Distance: 150 ft  Trunk/Postural Assessment  Cervical Assessment Cervical Assessment: Exceptions to [facility] (c-collar) Thoracic Assessment Thoracic Assessment: Within Functional Limits Lumbar Assessment Lumbar Assessment: Within Functional Limits Postural Control Postural Control: Deficits on evaluation Postural Limitations: narrow base, leg length discrepancy stands w/R knee flexed, forward trunk lean, functional improvement from baseline  Balance Balance Balance Assessed: Yes Static Sitting Balance Static Sitting - Balance Support: No upper extremity supported;Feet supported Static Sitting - Level of Assistance: 6: Modified independent (Device/Increase time) Dynamic  Sitting Balance Dynamic Sitting - Balance Support: During functional activity;Feet supported Dynamic Sitting - Level of Assistance: 6: Modified independent (Device/Increase time) Static Standing Balance Static Standing - Balance Support: During functional activity Static Standing - Level of Assistance: 6: Modified independent (Device/Increase time) Dynamic Standing Balance Dynamic Standing - Balance Support: Right upper extremity supported;Left upper extremity supported;During functional activity Dynamic Standing - Level of Assistance: 6: Modified independent (Device/Increase time) Extremity Assessment      RLE Assessment RLE Assessment: Exceptions to [facility] Passive Range of Motion (PROM) Comments: knee extension limited by arthritic changes, lacks approx 15* extension, ankles w/limited DF/PF A/PROM Active Range of Motion (AROM) Comments: 4/5 in avail reange in ankles, knees, hips General Strength Comments: see above LLE Assessment LLE Assessment: Exceptions to [facility] Passive Range of Motion (PROM) Comments: limited hip extension General Strength Comments: grossly 4-/5    Pearlie Lafosse C Aneesah Hernan 01/29/2021, 12:09 PM

## 2021-01-29 NOTE — Progress Notes (Signed)
Occupational Therapy Session Note  Patient Details  Name: Logan [name] MRN: [id number] Date of Birth: 01/27/1946  Today's Date: 01/29/2021 OT Individual Time: 2542-7062 and 3762-8315 OT Individual Time Calculation (min): 55 min and 57 min   Short Term Goals: Week 2:  OT Short Term Goal 1 (Week 2): Pt will adhere to cervical precautions consistently during ADL w/ no more than Min cues OT Short Term Goal 1 - Progress (Week 2): Met OT Short Term Goal 2 (Week 2): Pt will increase Right shoulder scaption to 50* to improve functional skill performance OT Short Term Goal 2 - Progress (Week 2): Not met OT Short Term Goal 3 (Week 2): Pt will perform BSC/toilet transfers with LRAD and Supervision OT Short Term Goal 3 - Progress (Week 2): Progressing toward goal OT Short Term Goal 4 (Week 2): Pt will perform LB dress with AE PRN with Min A OT Short Term Goal 4 - Progress (Week 2): Progressing toward goal Week 3:  OT Short Term Goal 1 (Week 3): STGs = LTGs at Min/Mod  Skilled Therapeutic Interventions/Progress Updates:    Session 1: Pt greeted at times of session finishing up toileting with RN, hand off to OT when finished. Assisted with reaching soap and paper towels from seated position, able to manipulate hot/cold water without assist. After med pass completed, stand pivot old w/c > new w/c that will be the patient's when going home to ensure a good fit and comfort. Self propel room > hall in new personal wheelchair with Mod I with BUE propulsion. OT resuming when fatigued and for time management, pt performing TTB transfer with Supervision and extended time to manage LE's. Remainder of session at [facility] for dynamic standing while alternating UE support to hit targets for 1:00 and 2:00 to improve functional reach.Back in room set up alarm on call bell in reach.   Session 2: Pt greeted at time of session sitting up in wheelchair no pain, agreeable to OT session. Pt self propelling personal wheelchair  partially to gym, OT resuming for time conservation. Transported to ortho gym and applied additional velcro to C collar as part of it came off in shower in previous session. Pt demonstrating ability to remove collar using LUE but unable to fully don back on, stating son will assist. Transported to ADL apartment and reviewed IADL techniques for simple meal prep, pt able to retrieve 10/10 items from fridge at wheelchair level, remembering to lock brakes and able to maneuver obstacles. Able to simulate meal prep with microwave as his home is set up with Supervision. Discussed multiple ways to improve ease of meal prep and decrease fall risk at home and pt agreeable. Back in room set up with alarm on call bell in reach.   Therapy Documentation Precautions:  Precautions Precautions: Fall, Cervical Required Braces or Orthoses: Cervical Brace Cervical Brace: Hard collar, At all times, Other (comment) Restrictions Weight Bearing Restrictions: No    Therapy/Group: Individual Therapy  Viona Gilmore 01/29/2021, 7:08 AM

## 2021-01-29 NOTE — Progress Notes (Signed)
Pt abrasion to right knee is 100% pink and blanchable, 0% yellow. Cleaned with NS and applied foam dressing. Removed wound order.  Abrasion to left knee is also pink and blanchable without yellow material. Cleaned with NS and applied foam dressing. Removed wound order as well as santyl.

## 2021-01-29 NOTE — Progress Notes (Signed)
Occupational Therapy Discharge Summary  Patient Details  Name: Logan [name] MRN: [id number] Date of Birth: 1945/06/12    Patient has met 5 of 9 long term goals due to improved activity tolerance, improved balance, postural control, ability to compensate for deficits, functional use of  RIGHT upper and LEFT upper extremity, improved awareness, and improved coordination.  Patient to discharge at [facility] Assist - Supervision level.  Patient's care partner is independent to provide the necessary physical assistance at discharge.  Pt has shown significant progress with OT services, benefiting from extension for rehab. Pt is overall Min A for UB/LB dressing needing assist to bring button up shirts around shoulders and buttons but is able to do snaps. Pt has button hook that is able to use for standard buttons with moderate success. Pt able to dress with baggy shorts/pants with Supervision/CGA but needs more assist with tight briefs. Pt is Supervision - Min A for toileting needs pending if incontinent and urgency. Pt's don Logan [name] has completed training on 10/6 and feels prepared to take the pt home.   Reasons goals not met: Pt requires Set up for meals, Mod I with finger foods but set up for larger items like meats that need to be cut. Pt needs occasional Min A for toileting for incontinent BM, but otherwise is Supervision/CGA for regular BM.  Recommendation:  Patient will benefit from ongoing skilled OT services in home health setting to continue to advance functional skills in the area of BADL, iADL, and Reduce care partner burden.  Equipment: Son purchased TTB, hip kit, and button hook.  Reasons for discharge: treatment goals met and discharge from hospital  Patient/family agrees with progress made and goals achieved: Yes  OT Discharge Precautions/Restrictions  Precautions Precautions: Fall;Cervical Precaution Comments: pt with verbal understanding Required Braces or Orthoses: Cervical  Brace Cervical Brace: Hard collar;At all times;Other (comment) (off for cleaning skin/hygiene and eating) Restrictions Weight Bearing Restrictions: No Vital Signs Therapy Vitals Temp: 97.9 F (36.6 C) Temp Source: Oral Pulse Rate: 67 Resp: 17 BP: (!) 117/58 Patient Position (if appropriate): Sitting Oxygen Therapy SpO2: 98 % O2 Device: Room Air Pain Pain Assessment Pain Scale: 0-10 Pain Score: 0-No pain ADL ADL Equipment Provided: Feeding equipment, Reacher, Long-handled sponge Eating: Set up Grooming: Supervision/safety Where Assessed-Grooming: Sitting at sink Upper Body Bathing: Minimal assistance Where Assessed-Upper Body Bathing: Shower Lower Body Bathing: Minimal assistance Where Assessed-Lower Body Bathing: Shower Upper Body Dressing: Minimal assistance Where Assessed-Upper Body Dressing: Sitting at sink Lower Body Dressing: Minimal assistance Where Assessed-Lower Body Dressing: Sitting at sink, Standing at sink Toileting: Minimal assistance Where Assessed-Toileting: Toilet, Recruitment consultant Transfer: Close supervision Toilet Transfer Method: Stand pivot Tub/Shower Transfer: Contact guard Vision Baseline Vision/History: 1 Wears glasses Patient Visual Report: No change from baseline Vision Assessment?: No apparent visual deficits Perception  Perception: Within Functional Limits Praxis Praxis: Intact Cognition Overall Cognitive Status: Within Functional Limits for tasks assessed Arousal/Alertness: Awake/alert Orientation Level: Oriented X4 Year: 2022 Month: October Day of Week: Correct Selective Attention: Appears intact Memory: Appears intact Immediate Memory Recall: Sock;Blue;Bed Memory Recall Sock: Without Cue Memory Recall Blue: Without Cue Memory Recall Bed: Without Cue Awareness: Appears intact Problem Solving: Appears intact Safety/Judgment: Appears intact Sensation Sensation Light Touch: Appears Intact Coordination Gross Motor  Movements are Fluid and Coordinated: No Fine Motor Movements are Fluid and Coordinated: No Coordination and Movement Description: Slow speed and global coordination deficits  d/t ROM restrictions and weakness Motor  Motor Motor - Skilled Clinical Observations: weakness  x 4 extremities proximal > distal but greatly improved functional use of BUEs Mobility  Bed Mobility Bed Mobility: Left Sidelying to Sit Rolling Right: Contact Guard/Touching assist Right Sidelying to Sit: Moderate Assistance - Patient 50-74% Left Sidelying to Sit: Supervision/Verbal cueing Transfers Sit to Stand: Supervision/Verbal cueing Stand to Sit: Supervision/Verbal cueing  Trunk/Postural Assessment  Cervical Assessment Cervical Assessment: Exceptions to [facility] Thoracic Assessment Thoracic Assessment: Within Functional Limits Lumbar Assessment Lumbar Assessment: Within Functional Limits Postural Control Postural Control: Within Functional Limits  Balance Balance Balance Assessed: Yes Static Sitting Balance Static Sitting - Balance Support: No upper extremity supported;Feet supported Static Sitting - Level of Assistance: 6: Modified independent (Device/Increase time) Dynamic Sitting Balance Dynamic Sitting - Balance Support: During functional activity;Feet supported Dynamic Sitting - Level of Assistance: 6: Modified independent (Device/Increase time) Dynamic Sitting - Balance Activities: Lateral lean/weight shifting;Forward lean/weight shifting Static Standing Balance Static Standing - Balance Support: During functional activity Static Standing - Level of Assistance: 6: Modified independent (Device/Increase time) Dynamic Standing Balance Dynamic Standing - Balance Support: Right upper extremity supported;Left upper extremity supported;During functional activity Dynamic Standing - Level of Assistance: 5: Stand by assistance Dynamic Standing - Balance Activities: Lateral lean/weight shifting;Forward lean/weight  shifting;Reaching for objects Extremity/Trunk Assessment RUE Assessment RUE Assessment: Exceptions to [facility] Passive Range of Motion (PROM) Comments: WNL, recently diagnosed with anterior dislocation Active Range of Motion (AROM) Comments: shoulder flexion/abduction approx 30-45*, severely limited, per MD will eventually need total shoulder replacement General Strength Comments: improved functoinal use from eval, continues to have severe ROM and strength deficits 2/2 spinal injury and shoulder dislocation LUE Assessment LUE Assessment: Exceptions to [facility] Passive Range of Motion (PROM) Comments: WNL Active Range of Motion (AROM) Comments: WFL for most ADL tasks, compensates with trunk. General Strength Comments: Shoulder flexion/abduction approx 120*, able to use during most ADL tasks   Viona Gilmore 01/29/2021, 4:42 PM

## 2021-01-29 NOTE — Progress Notes (Signed)
Patient ID: Logan [name], male   DOB: 1945/06/20, 75 y.o.   MRN: [id number]  Pt feels ready for discharge tomorrow. He is aware of the team conference and his goals being met. He feels he has done well here and his son's will be in and out and assist him at home. Discussed he needs to follow the recommendations and he reports he will. Home health is arranged and equipment has been delivered. Ready for discharge tomorrow

## 2021-01-30 ENCOUNTER — Other Ambulatory Visit (HOSPITAL_COMMUNITY): Payer: Self-pay

## 2021-01-30 MED ORDER — CARVEDILOL 12.5 MG PO TABS
12.5000 mg | ORAL_TABLET | Freq: Two times a day (BID) | ORAL | 0 refills | Status: DC
  Filled 2021-01-30: qty 60, 30d supply, fill #0

## 2021-01-30 MED ORDER — SIMETHICONE 80 MG PO CHEW
160.0000 mg | CHEWABLE_TABLET | Freq: Four times a day (QID) | ORAL | 0 refills | Status: DC
  Filled 2021-01-30: qty 72, 9d supply, fill #0

## 2021-01-30 MED ORDER — POTASSIUM CHLORIDE CRYS ER 10 MEQ PO TBCR
20.0000 meq | EXTENDED_RELEASE_TABLET | Freq: Two times a day (BID) | ORAL | 0 refills | Status: DC
  Filled 2021-01-30: qty 120, 30d supply, fill #0

## 2021-01-30 MED ORDER — APIXABAN 5 MG PO TABS
5.0000 mg | ORAL_TABLET | Freq: Two times a day (BID) | ORAL | 0 refills | Status: DC
  Filled 2021-01-30: qty 60, 30d supply, fill #0

## 2021-01-30 MED ORDER — ASCORBIC ACID 500 MG PO TABS
500.0000 mg | ORAL_TABLET | Freq: Every day | ORAL | 0 refills | Status: DC
  Filled 2021-01-30: qty 30, 30d supply, fill #0

## 2021-01-30 MED ORDER — DICLOFENAC SODIUM 1 % EX GEL
2.0000 g | Freq: Four times a day (QID) | CUTANEOUS | 0 refills | Status: AC
  Filled 2021-01-30: qty 400, 28d supply, fill #0

## 2021-01-30 MED ORDER — TRAMADOL HCL 50 MG PO TABS
50.0000 mg | ORAL_TABLET | Freq: Four times a day (QID) | ORAL | 0 refills | Status: DC | PRN
  Filled 2021-01-30: qty 28, 7d supply, fill #0

## 2021-01-30 MED ORDER — LOSARTAN POTASSIUM 50 MG PO TABS
50.0000 mg | ORAL_TABLET | Freq: Every day | ORAL | 0 refills | Status: DC
  Filled 2021-01-30: qty 30, 30d supply, fill #0

## 2021-01-30 MED ORDER — LOSARTAN POTASSIUM 50 MG PO TABS: 50.0000 mg | ORAL_TABLET | Freq: Every day | ORAL | Status: DC

## 2021-01-30 MED ORDER — FUROSEMIDE 40 MG PO TABS
40.0000 mg | ORAL_TABLET | Freq: Every day | ORAL | 0 refills | Status: DC
  Filled 2021-01-30: qty 30, 30d supply, fill #0

## 2021-01-30 NOTE — Progress Notes (Signed)
[place] PHYSICAL MEDICINE & REHABILITATION PROGRESS NOTE  Subjective/Complaints:  Asking for Losartan to be decreased due to lower BP's with therapy.  Will do so.   ROS:   Pt denies SOB, abd pain, CP, N/V/C/D, and vision changes   Objective: Vital Signs: Blood pressure (!) 122/53, pulse 65, temperature 98.2 F (36.8 C), resp. rate 18, height 5\' 7"  (1.702 m), weight 89.6 kg, SpO2 95 %. No results found. Recent Labs    01/28/21 0741  WBC 9.6  HGB 13.1  HCT 40.3  PLT 272     Recent Labs    01/28/21 0741  NA 141  K 3.9  CL 100  CO2 34*  GLUCOSE 129*  BUN 22  CREATININE 0.89  CALCIUM 9.4      Intake/Output Summary (Last 24 hours) at 01/30/2021 0844 Last data filed at 01/30/2021 0825 Gross per 24 hour  Intake 790 ml  Output 550 ml  Net 240 ml        Physical Exam: BP (!) 122/53   Pulse 65   Temp 98.2 F (36.8 C)   Resp 18   Ht 5\' 7"  (1.702 m)   Wt 89.6 kg   SpO2 95%   BMI 30.94 kg/m       General: awake, alert, appropriate, sitting up in w/c; at bedside; wearing cervical collar; NAD HENT: conjugate gaze; oropharynx moist CV: regular rate; no JVD Pulmonary: CTA B/L; no W/R/R- good air movement GI: soft, NT, ND, (+)BS Psychiatric: appropriate Neurological: Ox3  Ext: no clubbing, cyanosis, or edema Psych: pleasant and cooperative   MS: R anterior shoulder dislocation- chronic Sensation intact to LT in BUE Fine motor able to oppose finger to thumb in BUEs Unable to lift RUE against gravity- has what appears to be R shoulder anteriorly dislocated on exam---chronic  Motor: Bilateral upper extremities: Shoulder abduction 2/5, elbow flexion3+ /extension 2+/5, handgrip 4/5, right weaker than left, stable Bilateral lower extremities: 4-/5 proximal distal Extremities: 1+ LE edema to lower calves B/L ongoing   Assessment/Plan: 1. Functional deficits which require 3+ hours per day of interdisciplinary therapy in a comprehensive inpatient rehab  setting. Physiatrist is providing close team supervision and 24 hour management of active medical problems listed below. Physiatrist and rehab team continue to assess barriers to discharge/monitor patient progress toward functional and medical goals   Care Tool:  Bathing    Body parts bathed by patient: Right arm, Left arm, Chest, Abdomen, Front perineal area, Right upper leg, Left upper leg, Right lower leg, Left lower leg, Buttocks   Body parts bathed by helper: Face, Buttocks     Bathing assist Assist Level: Minimal Assistance - Patient > 75%     Upper Body Dressing/Undressing Upper body dressing   What is the patient wearing?: Button up shirt    Upper body assist Assist Level: Minimal Assistance - Patient > 75%    Lower Body Dressing/Undressing Lower body dressing      What is the patient wearing?: Pants, Incontinence brief     Lower body assist Assist for lower body dressing: Minimal Assistance - Patient > 75%     Toileting Toileting    Toileting assist Assist for toileting: Minimal Assistance - Patient > 75%     Transfers Chair/bed transfer  Transfers assist     Chair/bed transfer assist level: Supervision/Verbal cueing Chair/bed transfer assistive device: Programmer, multimedia   Ambulation assist      Assist level: Supervision/Verbal cueing Assistive device: Walker-rolling Max distance:  200   Walk 10 feet activity   Assist     Assist level: Supervision/Verbal cueing Assistive device: Walker-rolling   Walk 50 feet activity   Assist Walk 50 feet with 2 turns activity did not occur: Safety/medical concerns  Assist level: Supervision/Verbal cueing Assistive device: Walker-rolling    Walk 150 feet activity   Assist Walk 150 feet activity did not occur: Safety/medical concerns  Assist level: Supervision/Verbal cueing Assistive device: Walker-rolling    Walk 10 feet on uneven surface  activity   Assist Walk 10 feet on  uneven surfaces activity did not occur: Safety/medical concerns   Assist level: Contact Guard/Touching assist Assistive device: Walker-rolling   Wheelchair     Assist Is the patient using a wheelchair?: Yes Type of Wheelchair: Manual    Wheelchair assist level: Supervision/Verbal cueing Max wheelchair distance: 150 ft    Wheelchair 50 feet with 2 turns activity    Assist        Assist Level: Supervision/Verbal cueing   Wheelchair 150 feet activity     Assist  Wheelchair 150 feet activity did not occur: Safety/medical concerns   Assist Level: Supervision/Verbal cueing    Medical Problem List and Plan: 1.  Cervical myelopathy- incopletel quadriplegia-  s/p C3-4 ACDF on 01/01/21  Con't PT and OT/CIR_ WBAT on RUE- for shoulder dislocation-   10/5- con't PT and OT- change d/c to 01/30/21 so can go home closer to mod I- family cannot provide more care. Needs bidet!  D/c today- keeping him longer made it possible for him to go home more safely! 2.  Impaired mobility:  -DVT/anticoagulation:  Pharmaceutical: Heparin--now d/ced Was on Eliquis PTA--held due to surgery--> resumed on 9/25             -antiplatelet therapy: N/A 3. Endstate OA left hip/Pain Management: Oxycodone prn             Continue voltaren gel to left hip QID             Xray showing left hip OA  9/28- pt needs R shoulder reverse shoulder arthroplasty- MRI and CT done- con't pain regimen for now.   9/29- also needs L hip replacement long term  10/7-increased hip pain, so will add tramadol 50 mg q6 hours prn for severe pain.   10/11- will send home on tramadol prn. Pain is much better 4. Mood: LCSW to follow for evaluation and support.              -antipsychotic agents: N/A 5. Neuropsych: This patient is capable of making decisions on his own behalf. 6. Bilateral knee abraisons/cervical insicion:  Routine pressure relief measures--monitor incision for healing.              --D/ced bacitracin  ointement Continue Aquacell with dry dressing to bilateral knee abrasions.   10/10- aquacel off 1 knee and just on 1 knee now- con't wound care 7. Fluids/Electrolytes/Nutrition: Monitor I/Os.  8. CAF: Monitor HR TID--continue to hold eliquis till 9/23             --On coreg BID.   9/27- change to daily weights  9/28- weight stable to lower- con't regimen  10/2- weight up slightly, but back to baseline- con't to monitor  10/5- Weight up to 85.9 kg- will recheck in AM and if still rising, will need to go from there.   10/4- weight up- Lasix was held- will make sure nursing gives- since weight up so much.   10/5- weight  back down 1 kg- will con't Lasix- but change parameters to hold if <100/60.   10/8- weight about the same- wil con't Lasix unless BP <846 systolic.  10/10- pt weight up more to 89 kg- was 86 kg and that was up more- will check pro BNP in am  10/11- pt's BNP was 88, so not fluid overloaded- will monitor  10/12- will reduce Losartan to 50 mg daily- since BP has been a little low Filed Weights   01/26/21 0344 01/27/21 0348 01/28/21 0454  Weight: 86.5 kg 88.1 kg 89.6 kg  9.  Dehydration: Encourage fluid intake. Monitor orthostatic symptoms  9/30- Pt's BUN up to 26- will give IVFs x 1 day 75cc/hour- and push fluids- c/o being weak- might need U/A and Cx if has fever?  10/3- BUN up to 28- is more dehydrated, but feeling OK- will monitor and give more IVFs if needed- can d/c IV for now.   10/10- BUN down to 22 and Cr 0.89 10. Hypoxia/ Productive cough: Will down grade diet to dysphagia 3 with aspiration precautions/supervison             --has been cleared to remove collar for meals.              --CXR showing atelectasis  Wean off supplemental oxygen  10/3- off O2  10/5- Con't ICS and push usage- if doesn't improve, will check CXR.   10/8 clear this am- will con't ICS 11.  Prediabetes diet controlled?: Has been off meds for 5 years.  Hemoglobin A1c 5.7 on 9/16 Relatively  controlled on 9/21 10/5- will stop Insulin and change CBGs to BID before meals.  Monitor with increased mobility 12. HTN: Monitor BP TID--continue Lasix and Cozaar. BP currently soft but as expected for SCI Vitals:   01/30/21 0331 01/30/21 0817  BP: 130/65 (!) 122/53  Pulse: 64 65  Resp: 18   Temp: 98.2 F (36.8 C)   SpO2: 95%   10/12- reduced Losartan to 50 mg daily  10/8- BP doing better- con't regimen 13.  Macrocytic anemia:   Folate within normal limits on 9/16 Hemoglobin 11.5 on 9/19 14. OSA: Does not use CPAP. 15. Constipation now with loose stools and neurogenic bowel: Has not had BM for a couple of days             Added Senna S.   MiraLAX started on 9/16  10/3- was having loose stools, but doing better- will monitor  10/4- will stop Miralax and make prn  10/6- doing bowel program- con't regimen  10/10- will add lomotil daily prn but still having loose stools with bowel program 10/11- bowels- doing much better with toileting!.  16. Overweight BMI 28.82: provide dietary education 17.  Hypokalemia  Potassium 3.9 on 9/19, plan to repeat labs on Friday  Supplemented increased on 9/16  18.  Hypoalbuminemia  Supplement initiated on 9/16 19.  Right> Left shoulder abd limitation with ant subluxation deformity on RIght side which appears chronic . Pt denies hx of shoulder surgery.  No pain with ROM, no xrays in Epic will order shoulder 2 V RIght   9/27- called Ortho about shoulder dislocation on R- will get CT and MRI- needs reverse shoulder surgery done eventually- can WBAT and no limitation on fROM per Ortho  9/28- MRI and CT done for surgical planning- torn and retracted biceps tendon; retracted rotator cuff tear.  20. Cough  9/28- asked pt to use ICS q1 hour while awake.  9/29- sounds MUCH better- con't ICS while awake  10/5- encourage ICS usage-  21. Mild LE edema  9/29- added TEDs when up.  22. Generalized weakness  9/30- feeling weak this AM- gave IVFs and might need  U/A and Cx.   10/2- resolved- con't to monitor 23. Dispo  10/7- spoke with son - pt needs bidet! Went over this precisely with son during family training.   10/11- d/c tomorrow  10/12- d/c today  LOS: 27 days A FACE TO FACE EVALUATION WAS PERFORMED  Darlen Gledhill 01/30/2021, 8:44 AM

## 2021-01-30 NOTE — Patient Care Conference (Signed)
Inpatient RehabilitationTeam Conference and Plan of Care Update Date: 01/29/2021   Time: 11:55 AM    Patient Name: Logan [name]      Medical Record Number: [id number]  Date of Birth: [birth year] Sex: Male         Room/Bed: 4W07C/4W07C-01 Payor Info: Payor: [employer] MEDICARE / Plan: [employer] HMO / Product Type: *No Product type* /    Admit Date/Time:  01/03/2021  4:40 PM  Primary Diagnosis:  Cervical disc disease with myelopathy  Hospital Problems: Principal Problem:   Cervical disc disease with myelopathy Active Problems:   Hypoxia   Supplemental oxygen dependent   Hypoalbuminemia due to protein-calorie malnutrition (HCC)   Hypokalemia   Macrocytic anemia   Prediabetes    Expected Discharge Date: Expected Discharge Date: 01/30/21  Team Members Present: Physician leading conference: Dr. Courtney Heys Social Worker Present: Ovidio Kin, LCSW Nurse Present: Dorthula Nettles, RN PT Present: Ailene Rud, PT OT Present: Lillia Corporal, OT PPS Coordinator present : Gunnar Fusi, SLP     Current Status/Progress Goal Weekly Team Focus  Bowel/Bladder   Patient is continent of B/B,continue`0/`0/22, bowel program, dulcolax suppository daily 8pm  regular BMs every 3 days or less      Swallow/Nutrition/ Hydration             ADL's   Supervision sit > stands, Supervision ADL transfers, bathing Min at shower, LB dress CGA/Supervision shorts but more for brief, bowels/loose stools limiting, LUE improved  downgraded ADLs to Min/Mod, Mod I toilet  NA, grad day   Mobility   CGA-supervision overall, adequate for d/c  mod I goals  grad day   Communication             Safety/Cognition/ Behavioral Observations            Pain   Rate pain occassionally 5-6/10 left knee and left hip  pain scale of <3/10  Assess pain QS/PRN,evaluate and document   Skin   Abrasion to right knee, clean with damp cloth, santyl dressing , cove with dessing daily  no skin breakdown.  Assess skin QS/PRN,  note drainage     Discharge Planning:  family education completed and both comfortable with care. Pt doing better moving and feels ready for discharge tomorrow   Team Discussion: Continue incentive spirometer at home. Will go home on Tramadol. Continent/incontinent of bowel/bladder. Refusing suppository for bowel program. Trazodone for sleep. Continue Santyl to right knee laceration, left knee cover with foam dressing only. Neck brace appears too big. Needs lots of time, balance has improved. With a normal BM he can reach for hygiene, will need assistance if not firm BM. Patient on target to meet rehab goals: yes, supervision ADL transfers, contact guard overall. Supervision for some things.  *See Care Plan and progress notes for long and short-term goals.   Revisions to Treatment Plan:  Adjusting medications for discharge.   Teaching Needs: Family education, medication management, pain management, skin/wound care, transfer training, gait training, balance training, endurance training, safety awareness.   Current Barriers to Discharge: Decreased caregiver support, Medical stability, Home enviroment access/layout, Incontinence, Neurogenic bowel and bladder, Wound care, Lack of/limited family support, Weight, Weight bearing restrictions, and Medication compliance  Possible Resolutions to Barriers: Family education with son's, continue current medications, ready for discharge.     Medical Summary Current Status: tramadol helpful for pain- continent/incontinent- LBM yesterday/this AM; Chronic R shoulder dislocation- Santyl to R knee- L knee foam only- refusing suppository-but not clear if is?  Barriers  to Discharge: Decreased family/caregiver support;Home enviroment access/layout;Incontinence;Neurogenic Bowel & Bladder;Weight;Weight bearing restrictions;Wound care  Barriers to Discharge Comments: family education done- d/c tomorrow with son Possible Resolutions to [employer]: making  a lot of OT improvements last week; balance better; cannot clean up when has loose stools; today is grad day; going home CGA; d/c tomorrow   Continued Need for Acute Rehabilitation Level of Care: The patient requires daily medical management by a physician with specialized training in physical medicine and rehabilitation for the following reasons: Direction of a multidisciplinary physical rehabilitation program to maximize functional independence : Yes Medical management of patient stability for increased activity during participation in an intensive rehabilitation regime.: Yes Analysis of laboratory values and/or radiology reports with any subsequent need for medication adjustment and/or medical intervention. : Yes   I attest that I was present, lead the team conference, and concur with the assessment and plan of the team.   Cristi Loron 01/30/2021, 9:27 AM

## 2021-01-30 NOTE — Progress Notes (Addendum)
Discharge Inpatient Rehabilitation Medication Review by a Pharmacist  A complete drug regimen review was completed for this patient to identify any potential clinically significant medication issues.  High Risk Drug Classes Is patient taking? Indication by Medication  Antipsychotic No   Anticoagulant Yes Apixaban: VTE prevention d/t AFib  Antibiotic No   Opioid Yes Tramadol: PRN pain control  Antiplatelet No   Hypoglycemics/insulin No   Vasoactive Medication Yes Carvedilol: BP control Losartan: BP control  Chemotherapy No   Other No Acetaminophen: PRN pain Allopurinol: gout Ascorbic acid: wound healing, supplementation Diclofenac gel: pain control Fluticasone nasal: PRN rhinitis Loratidine: PRN rhinitis/allergies Multivitamin: supplementation Omeprazole: PRN GERD treatment Potassium chloride: supplementation Simethicone: gas/bloating     Type of Medication Issue Identified Description of Issue Recommendation(s)  Drug Interaction(s) (clinically significant)     Duplicate Therapy     Allergy     No Medication Administration End Date     Incorrect Dose     Additional Drug Therapy Needed     Significant med changes from prior encounter (inform family/care partners about these prior to discharge).    Other       Clinically significant medication issues were identified that warrant physician communication and completion of prescribed/recommended actions by midnight of the next day:  No  Pharmacist comments: n/a   Time spent performing this drug regimen review (minutes):  20 minutes   Thank you for allowing pharmacy to be a part of this patient's care.  Ardyth Harps, PharmD Clinical Pharmacist

## 2021-01-30 NOTE — Progress Notes (Signed)
Patient is in good spirit, anticipates dischg home  today.

## 2021-01-30 NOTE — Discharge Summary (Signed)
Physician Discharge Summary  Patient ID: Logan [name] MRN: [id number] DOB/AGE: [birth year] 75 y.o.  Admit date: 01/03/2021 Discharge date: 01/30/2021  Discharge Diagnoses:  Principal Problem:   Cervical disc disease with myelopathy Active Problems:   OA (osteoarthritis) of hip   Persistent atrial fibrillation (HCC)   Essential hypertension   Hypoalbuminemia due to protein-calorie malnutrition (HCC)   Hypokalemia   Prediabetes   Shoulder subluxation, right   Discharged Condition: stable  Significant Diagnostic Studies: DG Chest 2 View  Result Date: 01/03/2021 CLINICAL DATA:  Persistent cough EXAM: CHEST - 2 VIEW COMPARISON:  11/13/2020 FINDINGS: Single frontal view of the chest demonstrates an unremarkable cardiac silhouette. Consolidation at the left lung base could reflect airspace disease or atelectasis. No effusion or pneumothorax. No acute bony abnormalities. Postsurgical changes are seen in the right supraclavicular region. IMPRESSION: 1. Left basilar consolidation, favor atelectasis given recent cervical spine surgery. Electronically Signed   By: Randa Ngo M.D.   On: 01/03/2021 17:14   DG Shoulder Right  Result Date: 01/14/2021 CLINICAL DATA:  Right shoulder deformity EXAM: RIGHT SHOULDER - 2+ VIEW COMPARISON:  None. FINDINGS: Evaluation of the right shoulder is slightly limited by suboptimal positioning. There is, however, anterosuperior dislocation of the humeral head in relation of the glenoid fossa. No definite fracture. Advanced degenerative arthritis of the acromioclavicular joint is noted. Limited evaluation of the right hemithorax is unremarkable. IMPRESSION: Anterosuperior dislocation of the right humeral head. Correlation with peripheral pulse examination of the right upper extremity is recommended. If abnormal, CT arteriography may be helpful to assess the axillobrachial vasculature. Electronically Signed   By: Fidela Salisbury M.D.   On: 01/14/2021 22:35   CT SHOULDER  RIGHT WO CONTRAST  Result Date: 01/15/2021 CLINICAL DATA:  Surgical planning. EXAM: CT OF THE UPPER RIGHT EXTREMITY WITHOUT CONTRAST TECHNIQUE: Multidetector CT imaging of the upper right extremity was performed according to the standard protocol. COMPARISON:  Radiographs and MRI. FINDINGS: Moderate glenohumeral joint degenerative changes. Moderate superior and lateral subluxation of the humeral head in relation to the glenoid fossa likely due to the large full-thickness retracted rotator cuff tear. Marked narrowing of the humeroacromial space with the humeral head riding under the acromion. No acute fractures are identified. No AVN. The [facility] joint is intact. The acromion is type 2 in shape. No significant lateral downsloping or subacromial spurring. Large full-thickness retracted rotator cuff tear as demonstrated on the MRI. There is also associated chronic fatty atrophy of the rotator cuff muscles. The simple lipoma is noted in the proximal supraspinatus muscle. Large joint effusion and large amount of fluid in the subacromial/subdeltoid bursa. The visualized right ribs are intact and the visualized right lung is grossly clear. No worrisome pulmonary lesions. IMPRESSION: 1. Large full-thickness retracted rotator cuff tear as demonstrated on the MRI. Associated severe chronic fatty atrophy of the rotator cuff muscles. 2. Superior and lateral subluxation of the humeral head in relation to the glenoid fossa. 3. Moderate glenohumeral joint degenerative changes. 4. Large joint effusion and large amount of fluid in the subacromial/subdeltoid bursa. Electronically Signed   By: Marijo Sanes M.D.   On: 01/15/2021 16:58   MR SHOULDER RIGHT WO CONTRAST  Result Date: 01/15/2021 CLINICAL DATA:  Right shoulder pain. EXAM: MRI OF THE RIGHT SHOULDER WITHOUT CONTRAST TECHNIQUE: Multiplanar, multisequence MR imaging of the shoulder was performed. No intravenous contrast was administered. COMPARISON:  Radiographs 01/14/2021  FINDINGS: Examination is quite limited due to significant motion artifact. Rotator cuff: Large full-thickness retracted rotator cuff tear.  The supraspinatus, infraspinatus and subscapularis tendons are completely torn and retracted. The subscapularis tendon is torn and flipped into the glenohumeral joint. Associated marked narrowing of the humeroacromial space with superior and anterior subluxation of the humeral head in relation to the glenoid fossa. Muscles:  Significant fatty atrophy of the rotator cuff muscles. Biceps long head: Not identified. Likely chronically torn and retracted. Acromioclavicular Joint: Moderate degenerative changes. Type 2 acromion. No significant lateral downsloping. Mild subacromial spurring. Glenohumeral Joint: Moderate degenerative changes. Large joint effusion and moderate synovitis. Labrum:  Degenerated and torn. Bones: Bone contusions involving the humeral head but no definite fracture. No glenoid fractures are identified. Bone contusion involving the coracoid process without definite fracture. Other: Large amount of fluid in the subacromial/subdeltoid bursa due to the large rotator cuff tear. IMPRESSION: 1. Large full-thickness retracted rotator cuff tear as described above. See associated chronic fatty atrophy of the rotator cuff muscles. 2. Superior and lateral subluxation of the humeral head. 3. Likely chronically torn and retracted long head biceps tendon. 4. Bone contusions involving the humeral head and coracoid process without definite fracture. 5. Large joint effusion and moderate synovitis. Electronically Signed   By: Marijo Sanes M.D.   On: 01/15/2021 15:18   DG HIP UNILAT WITH PELVIS 2-3 VIEWS LEFT  Result Date: 01/03/2021 CLINICAL DATA:  Left hip pain without trauma EXAM: DG HIP (WITH OR WITHOUT PELVIS) 2-3V LEFT COMPARISON:  11/05/2020 FINDINGS: AP view the pelvis and AP/frog leg views of the left hip. Marked left femoral head flattening and altered morphology,  suggesting developmental dysplasia. Concurrent joint space narrowing and subchondral sclerosis within both femoral head and acetabulum. No acute fracture or dislocation. IMPRESSION: No significant change since the prior exam. Left hip osteoarthritis, likely secondary to developmental dysplasia. Electronically Signed   By: Abigail Miyamoto M.D.   On: 01/03/2021 17:13     Labs:  Basic Metabolic Panel: BMP Latest Ref Rng & Units 01/28/2021 01/21/2021 01/18/2021  Glucose 70 - 99 mg/dL 129(H) 90 124(H)  BUN 8 - 23 mg/dL 22 28(H) 26(H)  Creatinine 0.61 - 1.24 mg/dL 0.89 0.87 0.90  BUN/Creat Ratio 10 - 24 - - -  Sodium 135 - 145 mmol/L 141 140 140  Potassium 3.5 - 5.1 mmol/L 3.9 3.9 4.2  Chloride 98 - 111 mmol/L 100 105 102  CO2 22 - 32 mmol/L 34(H) 28 30  Calcium 8.9 - 10.3 mg/dL 9.4 8.8(L) 9.3     CBC: CBC Latest Ref Rng & Units 01/28/2021 01/21/2021 01/18/2021  WBC 4.0 - 10.5 K/uL 9.6 8.0 8.0  Hemoglobin 13.0 - 17.0 g/dL 13.1 12.0(L) 12.3(L)  Hematocrit 39.0 - 52.0 % 40.3 35.9(L) 37.6(L)  Platelets 150 - 400 K/uL 272 260 312     CBG: Recent Labs  Lab 01/27/21 1704 01/28/21 0610 01/28/21 1712 01/29/21 0608 01/29/21 1650  GLUCAP 93 95 102* 104* 133*    Brief HPI:   Marce Charlesworth is a 75 y.o. male with history of HTN, A fib, DVT, T2DM diet controlled who started developing weakness and numbness x6 months with progressive decline, falls and inability to walk.  He was found to have cervical myelopathy C3-C4 due to severe canal stenosis with anterolisthesis C3 on C4.  He was cleared for surgery by PCP and cardiology, Eliquis was placed on hold and he was admitted on 01/01/2021 for ACDF C3-C4 by Dr. Reatha Armour.  Postop was reporting improvement in sensation as well as strength of BUE, improvement in speech clarity as well as pain.  He has had issues with cough and hypoxia with dysphagia felt to be due to intubation.  Therapy evaluations completed revealing difficulty with ADLs due to BUE weakness, balance  deficits, leg length discrepancy due to left hip OA as well as decreased endurance.  CIR was recommended due to functional decline.   Hospital Course: Ozie Lupe was admitted to rehab 01/03/2021 for inpatient therapies to consist of PT, ST and OT at least three hours five days a week. Past admission physiatrist, therapy team and rehab RN have worked together to provide customized collaborative inpatient rehab.  He was maintained on subcu heparin and Eliquis was resumed on 09/25. He also reported issues with persistent cough at admission and chest x-ray done showing left basilar consolidation felt to be atelectasis.  He was advised on pulmonary hygiene and has been weaned off oxygen   He reported chronic left hip pain due to OA and x-rays done showing left hip OA likely secondary to developmental dysplasia.  Voltaren gel was added to help with hip pain noted with increase in activity.  His blood pressures were monitored on TID basis and has been reasonably controlled. Serial check of labs showed evidence of dehydration on 09/30 and improved with IVF X 24 hours. He was encouraged to increase fluid intake and follow up labs showed resolution of AKI. Hypokalemia has resolved with addition of K dur for supplementation.   BS have were initially monitored with ac/hs CBG checks and SSI was use prn for tighter BS control.  Blood sugars were well controlled and BS checks were decreased to BID. He was also noted to have right shoulder abnormality due to anterior dislocation which was felt to be chronic.  Ortho was consulted for input and recommended outpatient follow-up with Dr. Mable Fill for surgical intervention.  MRI also recommended for full work-up and this revealed large full-thickness retracted rotator cuff tear with chronic fatty atrophy of the rotator cuff muscle and superior and lateral subluxation of humeral head with chronically torn retracted long head biceps tendon and large joint effusion with moderate  synovitis.  Constipation has resolved and laxatives were weaned off but he continues to have intermittent incontinence episodes. His pain control has improved and he was weaned down to ultram qid prn. He has made good gain during his stay and supervision is recommended for safety. He will continue to receive follow up HHPT and [place] [facility] after discharge.    Rehab course: During patient's stay in rehab weekly team conferences were held to monitor patient's progress, set goals and discuss barriers to discharge. At admission, patient required max assist with multiple rest breaks for ADL tasks and for mobility. Speech therapy evaluation revealed mild pharyngeal dysphagia without over s/s of aspiration and Dysphagia 3 diet recommended with aspiration precautions. He has had improvement in activity tolerance, balance, postural control as well as ability to compensate for deficits. He requires set up assist for meals with finger food and min assist with UB/LB tasks.  He requires supervision for transfers and to ambulate 200' with RW and cues for safety. He was advised to regular diet and was tolerating it without difficulty therefore ST signed off by 09/19.  Family education was completed with son.   Disposition: Home  Diet: Limit carbs/sweets  Special Instructions: Need to wear collar at all times but may remove for meals. NO driving or strenuous activity.   Allergies as of 01/30/2021   No Known Allergies      Medication List  STOP taking these medications    DHEA 25 MG Caps   ketoconazole 2 % cream Commonly known as: NIZORAL   potassium chloride 10 MEQ CR capsule Commonly known as: MICRO-K Replaced by: potassium chloride 10 MEQ tablet       TAKE these medications    acetaminophen 500 MG tablet Commonly known as: TYLENOL Take 1,000 mg by mouth every 6 (six) hours as needed for mild pain.   allopurinol 300 MG tablet Commonly known as: ZYLOPRIM Take 300 mg by  mouth daily.   apixaban 5 MG Tabs tablet Commonly known as: Eliquis Take 1 tablet (5 mg total) by mouth 2 (two) times daily.   ascorbic acid 500 MG tablet Commonly known as: VITAMIN C Take 1 tablet (500 mg total) by mouth daily.   carvedilol 12.5 MG tablet Commonly known as: COREG Take 1 tablet (12.5 mg total) by mouth 2 (two) times daily with a meal.   diclofenac Sodium 1 % Gel Commonly known as: VOLTAREN Apply 2 g topically 4 (four) times daily. To left hip   fluticasone 50 MCG/ACT nasal spray Commonly known as: FLONASE Place 1 spray into both nostrils daily as needed for rhinitis.   furosemide 40 MG tablet Commonly known as: LASIX Take 1 tablet (40 mg total) by mouth daily. What changed: when to take this   loratadine 10 MG tablet Commonly known as: CLARITIN Take 10 mg by mouth daily as needed for allergies.   losartan 50 MG tablet Commonly known as: COZAAR Take 1 tablet (50 mg total) by mouth daily. Start taking on: January 31, 2021 What changed:  medication strength how much to take   multivitamin tablet Take 1 tablet by mouth daily.   omeprazole 20 MG capsule Commonly known as: PRILOSEC Take 20 mg by mouth daily as needed (for heartburn).   potassium chloride 10 MEQ tablet Commonly known as: KLOR-CON Take 2 tablets (20 mEq total) by mouth 2 (two) times daily. Replaces: potassium chloride 10 MEQ CR capsule   simethicone 80 MG chewable tablet Commonly known as: MYLICON Chew 2 tablets (160 mg total) by mouth 4 (four) times daily. Notes to patient: For gas and bloating   traMADol 50 MG tablet--Rx# 28 pills Commonly known as: ULTRAM Take 1 tablet (50 mg total) by mouth every 6 (six) hours as needed for moderate pain. What changed: when to take this        Follow-up Information     [name], MD. Call in 1 day(s).   Specialty: Internal Medicine Why: for post hospital follow up Contact information: [place]. [place]  [address] [phone]         Courtney Heys, MD Follow up.   Specialty: Physical Medicine and Rehabilitation Why: office will call you with follow up appointment Contact information: [address] [place] [address] [phone]         Georgeanna Harrison, MD. Call in 1 day(s).   Why: for follow up on shoulder and hip Contact information: [address] [place] [address] [phone]         Dawley, Theodoro Doing, DO. Call in 1 day(s).   Why: for post op check Contact information: [address] [place] [address] [phone]                 Signed: Bary Leriche 01/31/2021, 6:01 PM

## 2021-01-31 ENCOUNTER — Other Ambulatory Visit: Payer: Self-pay

## 2021-01-31 DIAGNOSIS — S43001A Unspecified subluxation of right shoulder joint, initial encounter: Secondary | ICD-10-CM

## 2021-01-31 HISTORY — DX: Unspecified subluxation of right shoulder joint, initial encounter: S43.001A

## 2021-01-31 NOTE — Patient Outreach (Signed)
[facility]) Care Management  01/31/2021  Deddrick Saindon [birth year] [id number]     Transition of Care Referral  Referral Date: 01/31/2021 Referral Source:Discharge Report Date of Discharge: 01/30/2021 Facility: [facility]    Referral received. Transition of care calls being completed via EMMI-automated calls.     Plan: RN CM will close referral.   Enzo Montgomery, RN,BSN,CCM [facility] [place] Management Telephonic Care Management Coordinator Direct Phone: [phone] Toll Free: [phone] Fax: [phone]

## 2021-02-11 ENCOUNTER — Telehealth (HOSPITAL_COMMUNITY): Payer: Self-pay

## 2021-02-11 ENCOUNTER — Other Ambulatory Visit (HOSPITAL_COMMUNITY): Payer: Self-pay

## 2021-02-11 DIAGNOSIS — G992 Myelopathy in diseases classified elsewhere: Secondary | ICD-10-CM | POA: Diagnosis not present

## 2021-02-11 DIAGNOSIS — M4802 Spinal stenosis, cervical region: Secondary | ICD-10-CM | POA: Diagnosis not present

## 2021-02-11 NOTE — Telephone Encounter (Signed)
Pharmacy Transitions of Care Follow-up Telephone Call  Date of discharge: 01/30/21  Discharge Diagnosis: Cervical disc disease  How have you been since you were released from the hospital?  Patient doing well since discharge, no questions about meds at this time. Patient knows to call MD if he runs out of meds before next MD appointment.  Medication changes made at discharge:     START taking: diclofenac Sodium (VOLTAREN)  potassium chloride (KLOR-CON)  V-R GAS RELIEF (simethicone)  vitamin C (ASCORBIC ACID)  CHANGE how you take: losartan (COZAAR)  traMADol (ULTRAM)  STOP taking: DHEA 25 MG Caps  ketoconazole 2 % cream (NIZORAL)   Medication changes verified by the patient? Yes    Medication Accessibility:  Home Pharmacy: not discussed   Was the patient provided with refills on discharged medications? No   Have all prescriptions been transferred from [facility] to home pharmacy? N/A   Is the patient able to afford medications? Has insurance    Medication Review:  APIXABAN (ELIQUIS)  Apixaban 5 mg BID initiated on 01/30/21.  - Discussed importance of taking medication around the same time everyday  - Advised patient of medications to avoid (NSAIDs, ASA)  - Educated that Tylenol (acetaminophen) will be the preferred analgesic to prevent risk of bleeding  - Emphasized importance of monitoring for signs and symptoms of bleeding (abnormal bruising, prolonged bleeding, nose bleeds, bleeding from gums, discolored urine, black tarry stools)  - Advised patient to alert all providers of anticoagulation therapy prior to starting a new medication or having a procedure   Follow-up Appointments:  PCP Hospital f/u appt confirmed? Scheduled to see Dr. Dagoberto Ligas on 03/04/21 @ 10:00 am.   [facility] f/u appt confirmed? Scheduled to see Dr. Bettina Gavia on 03/11/21 @ 2:00 pm.   If their condition worsens, is the pt aware to call PCP or go to the Emergency Dept.? Yes  Final Patient  Assessment: Patient has follow up scheduled, knows to get refills at home pharmacy

## 2021-02-19 ENCOUNTER — Telehealth: Payer: Self-pay

## 2021-02-19 DIAGNOSIS — G4733 Obstructive sleep apnea (adult) (pediatric): Secondary | ICD-10-CM | POA: Diagnosis not present

## 2021-02-19 DIAGNOSIS — G4700 Insomnia, unspecified: Secondary | ICD-10-CM | POA: Diagnosis not present

## 2021-02-19 DIAGNOSIS — M4802 Spinal stenosis, cervical region: Secondary | ICD-10-CM | POA: Diagnosis not present

## 2021-02-19 DIAGNOSIS — I4819 Other persistent atrial fibrillation: Secondary | ICD-10-CM | POA: Diagnosis not present

## 2021-02-19 DIAGNOSIS — I110 Hypertensive heart disease with heart failure: Secondary | ICD-10-CM | POA: Diagnosis not present

## 2021-02-19 DIAGNOSIS — F419 Anxiety disorder, unspecified: Secondary | ICD-10-CM | POA: Diagnosis not present

## 2021-02-19 DIAGNOSIS — I5032 Chronic diastolic (congestive) heart failure: Secondary | ICD-10-CM | POA: Diagnosis not present

## 2021-02-19 DIAGNOSIS — M5000 Cervical disc disorder with myelopathy, unspecified cervical region: Secondary | ICD-10-CM | POA: Diagnosis not present

## 2021-02-19 DIAGNOSIS — E119 Type 2 diabetes mellitus without complications: Secondary | ICD-10-CM | POA: Diagnosis not present

## 2021-02-19 NOTE — Telephone Encounter (Signed)
In home physical therapy okay given. Hospital notes reviewed.Okay for [facility] , Ms. White PT for once a week for 9 weeks.  Call back phone [phone].

## 2021-02-21 ENCOUNTER — Telehealth: Payer: Self-pay

## 2021-02-21 NOTE — Telephone Encounter (Signed)
Mickel Baas called to get clarification for OT orders for patient. She stated that patient mentioned that he has a right torn rotator cuff and is going to have surgery soon for it. Upon looking in the notes, discovered that patient had a CT scan in hospital that revealed the tear in right rotator cuff. She wanted to make sure it was okay to hold off for about a month until patient has surgery and reevaluate the patient for OT. Gave verbal okay for that. Also gave verbal orders for PT to be 2x/week.

## 2021-02-24 DIAGNOSIS — E119 Type 2 diabetes mellitus without complications: Secondary | ICD-10-CM | POA: Diagnosis not present

## 2021-02-24 DIAGNOSIS — M5000 Cervical disc disorder with myelopathy, unspecified cervical region: Secondary | ICD-10-CM | POA: Diagnosis not present

## 2021-02-24 DIAGNOSIS — M1990 Unspecified osteoarthritis, unspecified site: Secondary | ICD-10-CM | POA: Diagnosis not present

## 2021-03-04 ENCOUNTER — Encounter: Payer: Medicare HMO | Attending: Physical Medicine and Rehabilitation | Admitting: Physical Medicine and Rehabilitation

## 2021-03-04 ENCOUNTER — Other Ambulatory Visit: Payer: Self-pay

## 2021-03-04 ENCOUNTER — Encounter: Payer: Self-pay | Admitting: Physical Medicine and Rehabilitation

## 2021-03-04 VITALS — BP 139/79 | HR 67 | Temp 98.0°F | Ht 67.0 in | Wt 188.2 lb

## 2021-03-04 DIAGNOSIS — G894 Chronic pain syndrome: Secondary | ICD-10-CM

## 2021-03-04 DIAGNOSIS — G959 Disease of spinal cord, unspecified: Secondary | ICD-10-CM | POA: Diagnosis not present

## 2021-03-04 DIAGNOSIS — G4701 Insomnia due to medical condition: Secondary | ICD-10-CM | POA: Insufficient documentation

## 2021-03-04 DIAGNOSIS — R269 Unspecified abnormalities of gait and mobility: Secondary | ICD-10-CM | POA: Insufficient documentation

## 2021-03-04 DIAGNOSIS — S43001S Unspecified subluxation of right shoulder joint, sequela: Secondary | ICD-10-CM | POA: Diagnosis not present

## 2021-03-04 DIAGNOSIS — Z79891 Long term (current) use of opiate analgesic: Secondary | ICD-10-CM | POA: Diagnosis not present

## 2021-03-04 DIAGNOSIS — M1612 Unilateral primary osteoarthritis, left hip: Secondary | ICD-10-CM | POA: Insufficient documentation

## 2021-03-04 DIAGNOSIS — Z5181 Encounter for therapeutic drug level monitoring: Secondary | ICD-10-CM | POA: Insufficient documentation

## 2021-03-04 DIAGNOSIS — G8250 Quadriplegia, unspecified: Secondary | ICD-10-CM | POA: Diagnosis not present

## 2021-03-04 HISTORY — DX: Insomnia due to medical condition: G47.01

## 2021-03-04 HISTORY — DX: Chronic pain syndrome: G89.4

## 2021-03-04 HISTORY — DX: Quadriplegia, unspecified: G82.50

## 2021-03-04 HISTORY — DX: Unspecified abnormalities of gait and mobility: R26.9

## 2021-03-04 MED ORDER — TRAMADOL HCL 50 MG PO TABS: 100.0000 mg | ORAL_TABLET | Freq: Three times a day (TID) | ORAL | 1 refills | Status: DC | PRN

## 2021-03-04 MED ORDER — TRAZODONE HCL 50 MG PO TABS: 50.0000 mg | ORAL_TABLET | Freq: Every evening | ORAL | 5 refills | Status: DC | PRN

## 2021-03-04 NOTE — Progress Notes (Signed)
Oral swab drug screen done and pt signed CSA and Opioid consent form. Reports he took tramadol last pm.

## 2021-03-04 NOTE — Addendum Note (Signed)
Addended by: Caro Hight on: 03/04/2021 11:13 AM   Modules accepted: Orders

## 2021-03-04 NOTE — Progress Notes (Signed)
Subjective:    Patient ID: Logan [name], male    DOB: [birth year], 75 y.o.   MRN: [id number]  HPI Pt is a 75 yr old male with cervical myelopathy and incomplete quadriplegia s/p C3/4 ACDF on 01/01/21-  Also has chronic R shoulder dislocation, on CAD and Chronic Afib on Eliquis; End stage L hip OA- needs surgery eventually; neurogenic bowel and bladder-   Here for hospital f/u on SCI.    L hip is giving him a lot of pain- Tramadol is not slowing down pain much; also R shoulder is hurting.    Has appointment with Ortho 12/1 about R shoulder and L hip.   Can only take 1 tramadol/day-  Doesn't help even a little bit when takes the 1 tab/day.  Hasn't seen PCP- since seeing so many doctors/appointments.   Had some tramadol at home-    Michela Pitcher no one went over d/c paperwork with him. And son said the same thing.   Lives in mobile home - bathroom remodeled too.   Doing H/H therapy-  Had OT- didn't come back because wants to save visits for shoulder surgery.  Son helps him wash his hair and shave-   Walking with RW now; but starting cane tomorrow. With PT.   Getting around  Coming out of collar a few hours/day- and weaning off it. Keeping off 4-5 hours/day and off for sleep.    Bowels- q3 days or so- can get into bathroom- remodeled bathroom and made door larger.   Did the bidet for the toilet. And shower bench to scoot in to shower.   Walking some outside the house, but hasn't gone to grocery store yet.   Pain Inventory Average Pain 4 Pain Right Now 8 My pain is constant and sharp  In the last 24 hours, has pain interfered with the following? General activity 3 Relation with others 3 Enjoyment of life 3 What TIME of day is your pain at its worst? morning  and night Sleep (in general) Poor  Pain is worse with: unsure Pain improves with: rest Relief from Meds: 2  use a walker how many minutes can you walk? 10-15 ability to climb steps?  yes do you drive?   no  retired I need assistance with the following:  dressing, bathing, meal prep, and household duties  numbness tingling trouble walking  Any changes since last visit?  no  Any changes since last visit?  no    Family History  Problem Relation Age of Onset   Heart disease Mother    Cancer Father        throat cancer   Heart disease Brother    Atrial fibrillation Brother    Social History   Socioeconomic History   Marital status: Widowed    Spouse name: Not on file   Number of children: Not on file   Years of education: Not on file   Highest education level: Not on file  Occupational History   Not on file  Tobacco Use   Smoking status: Former   Smokeless tobacco: Former    Types: Nurse, children's Use: Never used  Substance and Sexual Activity   Alcohol use: Never   Drug use: Never   Sexual activity: Not Currently  Other Topics Concern   Not on file  Social History Narrative   Not on file   Social Determinants of Health   Financial Resource Strain: Not on file  Food Insecurity: Not on  file  Transportation Needs: Not on file  Physical Activity: Not on file  Stress: Not on file  Social Connections: Not on file   Past Surgical History:  Procedure Laterality Date   ABDOMINAL AORTAGRAM N/A 02/28/2014   Procedure: ABDOMINAL Maxcine Ham;  Surgeon: Serafina Mitchell, MD;  Location: [facility] CATH LAB;  Service: Cardiovascular;  Laterality: N/A;   ANTERIOR CERVICAL DECOMP/DISCECTOMY FUSION N/A 01/01/2021   Procedure: ACDF C3-4;  Surgeon: Dawley, Theodoro Doing, DO;  Location: [place];  Service: Neurosurgery;  Laterality: N/A;   APPENDECTOMY     ATRIAL FIBRILLATION ABLATION N/A 02/29/2020   Procedure: ATRIAL FIBRILLATION ABLATION;  Surgeon: Constance Haw, MD;  Location: [place] CV LAB;  Service: Cardiovascular;  Laterality: N/A;   CARDIOVERSION N/A 10/18/2020   Procedure: CARDIOVERSION (CATH LAB);  Surgeon: Constance Haw, MD;  Location: [place] CV LAB;   Service: Cardiovascular;  Laterality: N/A;   CARPAL TUNNEL RELEASE Left    Past Medical History:  Diagnosis Date   Anxiety    Atherosclerotic renal artery stenosis, unilateral (HCC) 02/28/2014   Atrial fibrillation (HCC)    Bilateral lower extremity edema    Chronic diastolic heart failure ([place]) 10/21/2019   Diabetes mellitus without complication (HCC)    DVT (deep venous thrombosis) (HCC)    Esophageal reflux    Essential hypertension 10/21/2019   Gout    Hyperlipidemia    Hypertension    Mixed hyperlipidemia 10/21/2019   Morbid obesity ([place])    Obesity (BMI 30-39.9) 08/24/2019   Orthopnea    Osteoarthritis    Persistent atrial fibrillation ([place]) 10/21/2019   Seasonal allergies    Sleep apnea    Ht 5\' 7"  (1.702 m)   Wt 188 lb 3.2 oz (85.4 kg)   BMI 29.48 kg/m   Opioid Risk Score:   Fall Risk Score:  `1  Depression screen PHQ 2/9  Depression screen PHQ 2/9 03/04/2021  Decreased Interest 1  Down, Depressed, Hopeless 1  PHQ - 2 Score 2  Altered sleeping 2  Tired, decreased energy 2  Change in appetite 0  Feeling bad or failure about yourself  1  Moving slowly or fidgety/restless 0  Suicidal thoughts 0  PHQ-9 Score 7  Difficult doing work/chores Somewhat difficult     Review of Systems  Constitutional: Negative.   HENT: Negative.    Eyes: Negative.   Respiratory: Negative.    Cardiovascular: Negative.   Gastrointestinal: Negative.   Endocrine: Negative.   Genitourinary: Negative.   Musculoskeletal:  Positive for gait problem and neck pain.  Skin: Negative.   Allergic/Immunologic: Positive for environmental allergies.  Neurological:  Positive for numbness.  Hematological: Negative.   Psychiatric/Behavioral:  Positive for sleep disturbance. The patient is nervous/anxious.       Objective:   Physical Exam Awake, alert, using RW; R shoulder downwards; accompanied by son, NAD Wearing cervical collar  MS: RUE_ deltoid- 2/5 due to shoulder dislocation; biceps 4/5;  triceps 4/5; WE 4+/5 and grip and finger abd 4+/5 LUE- is 4+/5 in all muscles RLE- HF, KE, DF and PF 4+/5 LLE- HF/KE/DF and PF 4+/5  Neuro: (-) hoffman's- no clonus B/L  No increased tone   1+ edema in LE's to ankle B/L        Assessment & Plan:   Pt is a 75 yr old male with cervical myelopathy and incomplete quadriplegia s/p C3/4 ACDF on 01/01/21-  Also has chronic R shoulder dislocation, on CAD and Chronic Afib on Eliquis; End stage  L hip OA- needs surgery eventually; neurogenic bowel and bladder-   Here for hospital f/u on SCI.   Shoulder surgery necessary, but cannot guarantee that will be able to get his arm up past 90 degrees.    2. Would only wear collar for comfort only. And keep off otherwise.    3. Trazodone- for sleep 50 mg nightly AS NEEDED- for sleep.   4. Tramadol 50-100 mg 3x/day as needed for pain. #180- 1 refill.   5. Went over spasticity- muscle jerking or spasms- or really tight, like need to range legs/arms all the time- won't prescribe any meds right now- so only will prescribe if needs it in future.   6.  UDS and opiate contract today since prescribing Tramadol.  7. F/U in 3 months-   8. Neurogenic bowel and bladder doing better- however will likely need Miralax- usually every 1-2 days- because will get constipated on tramadol.   9. We discussed chronic pain- and will wait on nerve pain meds.   I spent a total of 32 minutes on visit- discussing pain control and end stage arthiritis as well as education on spasticity- he doesn't have, but at high risk right now

## 2021-03-04 NOTE — Patient Instructions (Signed)
Pt is a 75 yr old male with cervical myelopathy and incomplete quadriplegia s/p C3/4 ACDF on 01/01/21-  Also has chronic R shoulder dislocation, on CAD and Chronic Afib on Eliquis; End stage L hip OA- needs surgery eventually; neurogenic bowel and bladder-   Here for hospital f/u on SCI.   Shoulder surgery necessary, but cannot guarantee that will be able to get his arm up past 90 degrees.    2. Would only wear collar for comfort only. And keep off otherwise.    3. Trazodone- for sleep 50 mg nightly AS NEEDED- for sleep.   4. Tramadol 50-100 mg 3x/day as needed for pain. #180- 1 refill.   5. Went over spasticity- muscle jerking or spasms- or really tight, like need to range legs/arms all the time- won't prescribe any meds right now- so only will prescribe if needs it in future.   6.  UDS and opiate contract today since prescribing Tramadol.  7. F/U in 3 months-   8. Neurogenic bowel and bladder doing better- however will likely need Miralax- usually every 1-2 days- because will get constipated on tramadol.   9. We discussed chronic pain- and will wait on nerve pain meds.

## 2021-03-07 DIAGNOSIS — M1990 Unspecified osteoarthritis, unspecified site: Secondary | ICD-10-CM | POA: Insufficient documentation

## 2021-03-09 LAB — DRUG TOX MONITOR 1 W/CONF, ORAL FLD

## 2021-03-09 LAB — DRUG TOX ALC METAB W/CON, ORAL FLD: Alcohol Metabolite: NEGATIVE ng/mL (ref <25)

## 2021-03-10 NOTE — Progress Notes (Signed)
Cardiology Office Note:    Date:  03/11/2021   ID:  Logan [name], DOB 1945-12-18, MRN [id number]  PCP:  [name], MD  Cardiologist:  Shirlee More, MD    Referring MD: [name], MD    ASSESSMENT:    1. Permanent atrial fibrillation ([place])   2. Chronic anticoagulation   3. Hypertensive heart disease, unspecified whether heart failure present   4. Hypotension due to drugs   5. Mixed hyperlipidemia    PLAN:    In order of problems listed above:  Stable rate controlled continue his beta-blocker current anticoagulant and if additional surgery is needed I would hold 48 hours prior to surgery and 48 hours afterwards and longer and reinstitute as safe from a bleeding perspective Stable BP at target without symptomatic hypotension no evidence of heart failure continue his current diuretic beta-blocker and I would not try to tighten blood pressure control with his previous severe symptomatic orthostatic hypotension.  Retrospect it was due to spinal cord compression At this time I would not restart his statin previously prior to surgery I thought that he had severe myopathy due to his statin I think it would be confusing to reinstitute 1 now   Next appointment: 3 months   Medication Adjustments/Labs and Tests Ordered: Current medicines are reviewed at length with the patient today.  Concerns regarding medicines are outlined above.  No orders of the defined types were placed in this encounter.  No orders of the defined types were placed in this encounter.  Chief complaint follow-up after spine surgery, doing need to take potassium twice daily History of Present Illness:    Logan [name] is a 75 y.o. male with a hx of atrial fibrillation failing to maintain sinus rhythm with cardioversion and amiodarone therapy, gait dysfunction presumed to have amiodarone toxicity chronic anticoagulation hypertensive heart disease and symptomatic hypotension with alpha-blocker Cardura and  severe muscle weakness with a statin last seen by me 12/10/2020 prior to cervical spine surgery.  His spine surgery performed in September was without complication.  Compliance with diet, lifestyle and medications: Yes  He is remarkably improved still engaged in physical therapy and anticipates having right shoulder surgery rotator cuff. Is back on potassium I told him I think he can take it once a day if his trouble swallowing the pelvic and moderate dissolved in water or juice is a wax ghost for the large pill and we will go ahead and check a BMP and magnesium today. Remains on anticoagulant without any bleeding complication He has had no recurrent symptomatic hypotension No palpitation edema shortness of breath or syncope  His echocardiogram May 2021 showed preserved left and right ventricular function  1. Left ventricular ejection fraction, by estimation, is 55 to 60%. The  left ventricle has normal function. The left ventricle has no regional  wall motion abnormalities. There is mild concentric left ventricular  hypertrophy. Left ventricular diastolic  parameters are indeterminate.   2. Right ventricular systolic function is mildly reduced. The right  ventricular size is mildly enlarged. Mildly increased right ventricular  wall thickness. There is normal pulmonary artery systolic pressure.   3. Right atrial size was severely dilated.   4. The mitral valve is normal in structure. No evidence of mitral valve  regurgitation. No evidence of mitral stenosis.   5. The aortic valve is tricuspid. Aortic valve regurgitation is mild. No  aortic stenosis is present.   6. The inferior vena cava is dilated in size with >50% respiratory  variability, suggesting right atrial pressure of 8 mmHg Past Medical History:  Diagnosis Date   Anxiety    Atherosclerotic renal artery stenosis, unilateral (HCC) 02/28/2014   Atrial fibrillation (HCC)    Bilateral lower extremity edema    Chronic diastolic  heart failure ([place]) 10/21/2019   Diabetes mellitus without complication (HCC)    DVT (deep venous thrombosis) (HCC)    Esophageal reflux    Essential hypertension 10/21/2019   Gout    Hyperlipidemia    Hypertension    Mixed hyperlipidemia 10/21/2019   Morbid obesity ([place])    Obesity (BMI 30-39.9) 08/24/2019   Orthopnea    Osteoarthritis    Persistent atrial fibrillation ([place]) 10/21/2019   Seasonal allergies    Sleep apnea     Past Surgical History:  Procedure Laterality Date   ABDOMINAL AORTAGRAM N/A 02/28/2014   Procedure: ABDOMINAL Maxcine Ham;  Surgeon: Serafina Mitchell, MD;  Location: [facility] CATH LAB;  Service: Cardiovascular;  Laterality: N/A;   ANTERIOR CERVICAL DECOMP/DISCECTOMY FUSION N/A 01/01/2021   Procedure: ACDF C3-4;  Surgeon: Dawley, Theodoro Doing, DO;  Location: [place];  Service: Neurosurgery;  Laterality: N/A;   APPENDECTOMY     ATRIAL FIBRILLATION ABLATION N/A 02/29/2020   Procedure: ATRIAL FIBRILLATION ABLATION;  Surgeon: Constance Haw, MD;  Location: [place] CV LAB;  Service: Cardiovascular;  Laterality: N/A;   CARDIOVERSION N/A 10/18/2020   Procedure: CARDIOVERSION (CATH LAB);  Surgeon: Constance Haw, MD;  Location: [place] CV LAB;  Service: Cardiovascular;  Laterality: N/A;   CARPAL TUNNEL RELEASE Left     Current Medications: Current Meds  Medication Sig   acetaminophen (TYLENOL) 500 MG tablet Take 1,000 mg by mouth every 6 (six) hours as needed for mild pain.   allopurinol (ZYLOPRIM) 300 MG tablet Take 300 mg by mouth daily.   apixaban (ELIQUIS) 5 MG TABS tablet Take 1 tablet (5 mg total) by mouth 2 (two) times daily.   ascorbic acid (VITAMIN C) 500 MG tablet Take 1 tablet (500 mg total) by mouth daily.   carvedilol (COREG) 12.5 MG tablet Take 1 tablet (12.5 mg total) by mouth 2 (two) times daily with a meal.   diclofenac Sodium (VOLTAREN) 1 % GEL Apply 2 g topically 4 (four) times daily. To left hip   fluticasone (FLONASE) 50 MCG/ACT nasal spray Place 1  spray into both nostrils daily as needed for rhinitis.    furosemide (LASIX) 40 MG tablet Take 1 tablet (40 mg total) by mouth daily.   loratadine (CLARITIN) 10 MG tablet Take 10 mg by mouth daily as needed for allergies.   losartan (COZAAR) 50 MG tablet Take 1 tablet (50 mg total) by mouth daily.   Multiple Vitamin (MULTIVITAMIN) tablet Take 1 tablet by mouth daily.   omeprazole (PRILOSEC) 20 MG capsule Take 20 mg by mouth daily as needed (for heartburn).    potassium chloride (KLOR-CON) 10 MEQ tablet Take 2 tablets (20 mEq total) by mouth 2 (two) times daily. (Patient taking differently: Take 10 mEq by mouth 2 (two) times daily.)   simethicone (MYLICON) 80 MG chewable tablet Chew 2 tablets (160 mg total) by mouth 4 (four) times daily.   traMADol (ULTRAM) 50 MG tablet Take 2 tablets (100 mg total) by mouth every 8 (eight) hours as needed for moderate pain.   traZODone (DESYREL) 50 MG tablet Take 1 tablet (50 mg total) by mouth at bedtime as needed for sleep.     Allergies:   Patient has no known allergies.  Social History   Socioeconomic History   Marital status: Widowed    Spouse name: Not on file   Number of children: Not on file   Years of education: Not on file   Highest education level: Not on file  Occupational History   Not on file  Tobacco Use   Smoking status: Former   Smokeless tobacco: Former    Types: Nurse, children's Use: Never used  Substance and Sexual Activity   Alcohol use: Never   Drug use: Never   Sexual activity: Not Currently  Other Topics Concern   Not on file  Social History Narrative   Not on file   Social Determinants of Health   Financial Resource Strain: Not on file  Food Insecurity: Not on file  Transportation Needs: Not on file  Physical Activity: Not on file  Stress: Not on file  Social Connections: Not on file     Family History: The patient's family history includes Atrial fibrillation in his brother; Cancer in his father;  Heart disease in his brother and mother. ROS:   Please see the history of present illness.    All other systems reviewed and are negative.  EKGs/Labs/Other Studies Reviewed:    The following studies were reviewed today:    Recent Labs: 10/26/2020: TSH 3.240 11/05/2020: Magnesium 1.9 01/18/2021: ALT 23 01/28/2021: BUN 22; Creatinine, Ser 0.89; Hemoglobin 13.1; Platelets 272; Potassium 3.9; Sodium 141 01/29/2021: B Natriuretic Peptide 88.1  Recent Lipid Panel No results found for: CHOL, TRIG, HDL, CHOLHDL, VLDL, LDLCALC, LDLDIRECT  Physical Exam:    VS:  BP (!) 145/81   Pulse 74   Ht 5\' 7"  (1.702 m)   Wt 188 lb 9.6 oz (85.5 kg)   SpO2 97%   BMI 29.54 kg/m     Wt Readings from Last 3 Encounters:  03/11/21 188 lb 9.6 oz (85.5 kg)  03/04/21 188 lb 3.2 oz (85.4 kg)  01/28/21 197 lb 8.5 oz (89.6 kg)     GEN: Remarkable improvement in his appearance well nourished, well developed in no acute distress HEENT: Normal NECK: No JVD; No carotid bruits LYMPHATICS: No lymphadenopathy CARDIAC: Irregular rate and rhythm  no murmurs, rubs, gallops RESPIRATORY:  Clear to auscultation without rales, wheezing or rhonchi  ABDOMEN: Soft, non-tender, non-distended MUSCULOSKELETAL:  No edema; No deformity  SKIN: Warm and dry NEUROLOGIC:  Alert and oriented x 3 PSYCHIATRIC:  Normal affect    Signed, Shirlee More, MD  03/11/2021 1:47 PM    [facility]

## 2021-03-11 ENCOUNTER — Encounter: Payer: Self-pay | Admitting: Cardiology

## 2021-03-11 ENCOUNTER — Other Ambulatory Visit: Payer: Self-pay

## 2021-03-11 ENCOUNTER — Ambulatory Visit: Payer: Medicare HMO | Admitting: Cardiology

## 2021-03-11 VITALS — BP 145/81 | HR 74 | Ht 67.0 in | Wt 188.6 lb

## 2021-03-11 DIAGNOSIS — I952 Hypotension due to drugs: Secondary | ICD-10-CM | POA: Diagnosis not present

## 2021-03-11 DIAGNOSIS — Z7901 Long term (current) use of anticoagulants: Secondary | ICD-10-CM | POA: Diagnosis not present

## 2021-03-11 DIAGNOSIS — I119 Hypertensive heart disease without heart failure: Secondary | ICD-10-CM | POA: Diagnosis not present

## 2021-03-11 DIAGNOSIS — E782 Mixed hyperlipidemia: Secondary | ICD-10-CM | POA: Diagnosis not present

## 2021-03-11 DIAGNOSIS — I4821 Permanent atrial fibrillation: Secondary | ICD-10-CM | POA: Diagnosis not present

## 2021-03-11 NOTE — Patient Instructions (Signed)
Medication Instructions:  Your physician has recommended you make the following change in your medication:  DECREASE: Potassium to once daily.  *If you need a refill on your cardiac medications before your next appointment, please call your pharmacy*   Lab Work: Your physician recommends that you return for lab work in: [place], [place] If you have labs (blood work) drawn today and your tests are completely normal, you will receive your results only by: [place] (if you have MyChart) OR A paper copy in the mail If you have any lab test that is abnormal or we need to change your treatment, we will call you to review the results.   Testing/Procedures: None   Follow-Up: At [facility], you and your health needs are our priority.  As part of our continuing mission to provide you with exceptional heart care, we have created designated Provider Care Teams.  These Care Teams include your primary Cardiologist (physician) and Advanced Practice Providers (APPs -  Physician Assistants and Nurse Practitioners) who all work together to provide you with the care you need, when you need it.  We recommend signing up for the patient portal called "MyChart".  Sign up information is provided on this After Visit Summary.  MyChart is used to connect with patients for Virtual Visits (Telemedicine).  Patients are able to view lab/test results, encounter notes, upcoming appointments, etc.  Non-urgent messages can be sent to your provider as well.   To learn more about what you can do with MyChart, go to [email or web address].    Your next appointment:   3 month(s)  The format for your next appointment:   In Person  Provider:   Shirlee More, MD    Other Instructions

## 2021-03-12 ENCOUNTER — Telehealth: Payer: Self-pay | Admitting: *Deleted

## 2021-03-12 LAB — BASIC METABOLIC PANEL
BUN/Creatinine Ratio: 19 (ref 10–24)
BUN: 14 mg/dL (ref 8–27)
CO2: 29 mmol/L (ref 20–29)
Calcium: 9.4 mg/dL (ref 8.6–10.2)
Chloride: 101 mmol/L (ref 96–106)
Creatinine, Ser: 0.75 mg/dL — ABNORMAL LOW (ref 0.76–1.27)
Glucose: 68 mg/dL — ABNORMAL LOW (ref 70–99)
Potassium: 4.1 mmol/L (ref 3.5–5.2)
Sodium: 142 mmol/L (ref 134–144)
eGFR: 95 mL/min/{1.73_m2} (ref >59)

## 2021-03-12 LAB — MAGNESIUM: Magnesium: 1.8 mg/dL (ref 1.6–2.3)

## 2021-03-12 NOTE — Telephone Encounter (Signed)
Oral swab drug screen was consistent for prescribed medications.  ?

## 2021-03-21 DIAGNOSIS — M19211 Secondary osteoarthritis, right shoulder: Secondary | ICD-10-CM | POA: Diagnosis not present

## 2021-03-21 DIAGNOSIS — M1612 Unilateral primary osteoarthritis, left hip: Secondary | ICD-10-CM | POA: Diagnosis not present

## 2021-03-21 DIAGNOSIS — M75121 Complete rotator cuff tear or rupture of right shoulder, not specified as traumatic: Secondary | ICD-10-CM | POA: Diagnosis not present

## 2021-03-27 DIAGNOSIS — M4802 Spinal stenosis, cervical region: Secondary | ICD-10-CM | POA: Diagnosis not present

## 2021-03-27 DIAGNOSIS — G992 Myelopathy in diseases classified elsewhere: Secondary | ICD-10-CM | POA: Diagnosis not present

## 2021-04-10 DIAGNOSIS — K4020 Bilateral inguinal hernia, without obstruction or gangrene, not specified as recurrent: Secondary | ICD-10-CM | POA: Diagnosis not present

## 2021-04-10 DIAGNOSIS — N400 Enlarged prostate without lower urinary tract symptoms: Secondary | ICD-10-CM | POA: Diagnosis not present

## 2021-04-10 DIAGNOSIS — M47816 Spondylosis without myelopathy or radiculopathy, lumbar region: Secondary | ICD-10-CM | POA: Diagnosis not present

## 2021-04-10 DIAGNOSIS — M160 Bilateral primary osteoarthritis of hip: Secondary | ICD-10-CM | POA: Diagnosis not present

## 2021-04-10 DIAGNOSIS — M25552 Pain in left hip: Secondary | ICD-10-CM | POA: Diagnosis not present

## 2021-04-23 DIAGNOSIS — M25552 Pain in left hip: Secondary | ICD-10-CM | POA: Diagnosis not present

## 2021-04-23 DIAGNOSIS — M1612 Unilateral primary osteoarthritis, left hip: Secondary | ICD-10-CM | POA: Diagnosis not present

## 2021-04-25 ENCOUNTER — Telehealth: Payer: Self-pay

## 2021-04-25 NOTE — Telephone Encounter (Signed)
Logan [name], OT called and requested that she hold off with OT for 8 weeks and reevaluate.

## 2021-04-29 ENCOUNTER — Other Ambulatory Visit: Payer: Self-pay | Admitting: Orthopedic Surgery

## 2021-05-02 DIAGNOSIS — S300XXA Contusion of lower back and pelvis, initial encounter: Secondary | ICD-10-CM | POA: Diagnosis not present

## 2021-05-02 DIAGNOSIS — R109 Unspecified abdominal pain: Secondary | ICD-10-CM | POA: Diagnosis not present

## 2021-05-02 DIAGNOSIS — I700 Atherosclerosis of aorta: Secondary | ICD-10-CM | POA: Diagnosis not present

## 2021-05-02 DIAGNOSIS — Z20822 Contact with and (suspected) exposure to covid-19: Secondary | ICD-10-CM | POA: Diagnosis not present

## 2021-05-02 DIAGNOSIS — R0781 Pleurodynia: Secondary | ICD-10-CM | POA: Diagnosis not present

## 2021-05-02 DIAGNOSIS — Z043 Encounter for examination and observation following other accident: Secondary | ICD-10-CM | POA: Diagnosis not present

## 2021-05-02 DIAGNOSIS — R531 Weakness: Secondary | ICD-10-CM | POA: Diagnosis not present

## 2021-05-03 ENCOUNTER — Telehealth: Payer: Self-pay | Admitting: Cardiology

## 2021-05-03 NOTE — Telephone Encounter (Signed)
° °  Pre-operative Risk Assessment    Patient Name: Logan [name]  DOB: 11-29-1945 MRN: [id number]      Request for Surgical Clearance    Procedure:   left hip replacement   Date of Surgery:  Clearance 06/03/21                                 Surgeon:  Dr frank Paulo Fruit Surgeon's Group or Practice Name:  [facility] Phone number:  [phone] Fax number:  [phone]   Type of Clearance Requested:   - Medical Eliquis and TBD   Type of Anesthesia:  Spinal   Additional requests/questions:      SignedMilbert Coulter   05/03/2021, 9:01 AM

## 2021-05-03 NOTE — Telephone Encounter (Signed)
Clinical pharmacist to review Eliquis 

## 2021-05-04 NOTE — Telephone Encounter (Signed)
Patient with diagnosis of atrial fibrillation on Eliquis for anticoagulation.    Procedure: left hip replacement Date of procedure: 06/03/21   CHA2DS2-VASc Score = 5   This indicates a 7.2% annual risk of stroke. The patient's score is based upon: CHF History: 1 HTN History: 1 Diabetes History: 1 Stroke History: 0 Vascular Disease History: 1 Age Score: 1 Gender Score: 0    CrCl 92 Platelet count 272  Chart indicates history of prior DVT, but no information as to when or whether provoked.   Patient will need 3 day hold on Eliquis due to spinal anesthesia.  Will send to Dr. Bettina Gavia to determine if need for bridging prior to procedure.    For orthopedic procedures please be sure to resume therapeutic (not prophylactic) dosing.

## 2021-05-06 NOTE — Telephone Encounter (Signed)
Please clarify - yes he needs to be bridged or yes you saw him?  Thanks,  Erasmo Downer

## 2021-05-07 NOTE — Telephone Encounter (Signed)
° °  Primary Cardiologist: Berniece Salines, DO  Chart reviewed as part of pre-operative protocol coverage. Given past medical history and time since last visit, based on ACC/AHA guidelines, Melesio Madara would be at acceptable risk for the planned procedure without further cardiovascular testing.   Patient with diagnosis of atrial fibrillation on Eliquis for anticoagulation.     Procedure: left hip replacement Date of procedure: 06/03/21     CHA2DS2-VASc Score = 5   This indicates a 7.2% annual risk of stroke. The patient's score is based upon: CHF History: 1 HTN History: 1 Diabetes History: 1 Stroke History: 0 Vascular Disease History: 1 Age Score: 1 Gender Score: 0     CrCl 92 Platelet count 272   Chart indicates history of prior DVT, but no information as to when or whether provoked.   Patient will need 3 day hold on Eliquis due to spinal anesthesia.  He will not need Lovenox bridging for this procedure.  For orthopedic procedures please be sure to resume therapeutic (not prophylactic) dosing.  I will route this recommendation to the requesting party via Epic fax function and remove from pre-op pool.  Please call with questions.  Jossie Ng. Jolyne Laye NP-C    05/07/2021, 8:24 AM [place] Office [phone] Fax [phone]

## 2021-05-13 DIAGNOSIS — M25511 Pain in right shoulder: Secondary | ICD-10-CM | POA: Diagnosis not present

## 2021-05-13 DIAGNOSIS — M5001 Cervical disc disorder with myelopathy,  high cervical region: Secondary | ICD-10-CM | POA: Diagnosis not present

## 2021-05-13 DIAGNOSIS — G992 Myelopathy in diseases classified elsewhere: Secondary | ICD-10-CM | POA: Diagnosis not present

## 2021-05-13 DIAGNOSIS — M25552 Pain in left hip: Secondary | ICD-10-CM | POA: Diagnosis not present

## 2021-05-13 DIAGNOSIS — M4802 Spinal stenosis, cervical region: Secondary | ICD-10-CM | POA: Diagnosis not present

## 2021-05-17 DIAGNOSIS — M5000 Cervical disc disorder with myelopathy, unspecified cervical region: Secondary | ICD-10-CM | POA: Diagnosis not present

## 2021-05-17 DIAGNOSIS — K219 Gastro-esophageal reflux disease without esophagitis: Secondary | ICD-10-CM

## 2021-05-17 DIAGNOSIS — F419 Anxiety disorder, unspecified: Secondary | ICD-10-CM

## 2021-05-17 DIAGNOSIS — E119 Type 2 diabetes mellitus without complications: Secondary | ICD-10-CM

## 2021-05-17 DIAGNOSIS — I110 Hypertensive heart disease with heart failure: Secondary | ICD-10-CM | POA: Diagnosis not present

## 2021-05-17 DIAGNOSIS — M1612 Unilateral primary osteoarthritis, left hip: Secondary | ICD-10-CM

## 2021-05-17 DIAGNOSIS — D539 Nutritional anemia, unspecified: Secondary | ICD-10-CM

## 2021-05-17 DIAGNOSIS — M5412 Radiculopathy, cervical region: Secondary | ICD-10-CM

## 2021-05-17 DIAGNOSIS — G4700 Insomnia, unspecified: Secondary | ICD-10-CM

## 2021-05-17 DIAGNOSIS — M4802 Spinal stenosis, cervical region: Secondary | ICD-10-CM | POA: Diagnosis not present

## 2021-05-17 DIAGNOSIS — I4819 Other persistent atrial fibrillation: Secondary | ICD-10-CM

## 2021-05-17 DIAGNOSIS — G4733 Obstructive sleep apnea (adult) (pediatric): Secondary | ICD-10-CM

## 2021-05-17 DIAGNOSIS — I999 Unspecified disorder of circulatory system: Secondary | ICD-10-CM

## 2021-05-17 DIAGNOSIS — I701 Atherosclerosis of renal artery: Secondary | ICD-10-CM

## 2021-05-17 DIAGNOSIS — I5032 Chronic diastolic (congestive) heart failure: Secondary | ICD-10-CM | POA: Diagnosis not present

## 2021-05-20 ENCOUNTER — Telehealth: Payer: Self-pay

## 2021-05-20 NOTE — Telephone Encounter (Signed)
Aaron Edelman called from [place] to get verbal orders for PT. Returned call and gave verbal orders  1x/week for 3 weeks 2x/week for 5 weeks  1x/week for 1 week

## 2021-05-23 ENCOUNTER — Telehealth: Payer: Self-pay | Admitting: *Deleted

## 2021-05-23 NOTE — Patient Instructions (Addendum)
DUE TO COVID-19 ONLY ONE VISITOR IS ALLOWED TO COME WITH YOU AND STAY IN THE WAITING ROOM ONLY DURING PRE OP AND PROCEDURE.   **NO VISITORS ARE ALLOWED IN THE SHORT STAY AREA OR RECOVERY ROOM!!**  IF YOU WILL BE ADMITTED INTO THE HOSPITAL YOU ARE ALLOWED ONLY TWO SUPPORT PEOPLE DURING VISITATION HOURS ONLY (7 AM -8PM)   The support person(s) must pass our screening, gel in and out, and wear a mask at all times, including in the patients room. Patients must also wear a mask when staff or their support person are in the room. Visitors GUEST BADGE MUST BE WORN VISIBLY  One adult visitor may remain with you overnight and MUST be in the room by 8 P.M.  No visitors under the age of 51. Any visitor under the age of 2 must be accompanied by an adult.    COVID SWAB TESTING MUST BE COMPLETED ON:  05/30/21   Site: [facility] [place] Enter: Main Entrance have a seat in the waiting area to the right of main entrance (DO NOT [place]!!!!!) Dial: [phone] to alert staff you have arrived  You are not required to quarantine, however you are required to wear a well-fitted mask when you are out and around people not in your household.  Hand Hygiene often Do NOT share personal items Notify your provider if you are in close contact with someone who has COVID or you develop fever 100.4 or greater, new onset of sneezing, cough, sore throat, shortness of breath or body aches.  [place], [address], must go inside of the hospital, NOT A DRIVE THRU!  (Must self quarantine after testing. Follow instructions on handout.)       Your procedure is scheduled on: 06/03/21   Report to [facility] Main Entrance    Report to Short Stay at: 5:15 AM   Call this number if you have problems the morning of surgery [phone]   Do not eat food :After Midnight.   May have liquids until : 4:15 AM    day of surgery  CLEAR LIQUID DIET  Foods Allowed                                                                     Foods Excluded  Water, Black Coffee and tea, regular and decaf                             liquids that you cannot  Plain Jell-O in any flavor  (No red)                                           see through such as: Fruit ices (not with fruit pulp)                                     milk, soups, orange juice  Iced Popsicles (No red)                                    All solid food                                   Apple juices Sports drinks like Gatorade (No red) Lightly seasoned clear broth or consume(fat free) Sugar Sample Menu Breakfast                                Lunch                                     Supper Cranberry juice                    Beef broth                            Chicken broth Jell-O                                     Grape juice                           Apple juice Coffee or tea                        Jell-O                                      Popsicle                                                Coffee or tea                        Coffee or tea    Complete one Gatorade drink the morning of surgery 3 hours prior to scheduled surgery at: 4:15 AM.    The day of surgery:  Drink ONE (1) Pre-Surgery Clear Ensure or G2 at AM the morning of surgery. Drink in one sitting. Do not sip.  This drink was given to you during your hospital  pre-op appointment visit. Nothing else to drink after completing the  Pre-Surgery Clear Ensure or G2.          If you have questions, please contact your surgeons office.   Oral Hygiene is also important to reduce your risk of infection.                                    Remember - BRUSH YOUR TEETH THE MORNING OF SURGERY WITH YOUR REGULAR TOOTHPASTE   Do NOT smoke after Midnight   Take these medicines the morning of surgery with A SIP OF WATER:  loratadine,carvedilol,allopurinol.  DO NOT TAKE  ANY ORAL DIABETIC MEDICATIONS DAY OF YOUR SURGERY                              You may not have any metal on your body including hair pins, jewelry, and body piercing             Do not wear  lotions, powders, perfumes/cologne, or deodorant              Men may shave face and neck.   Do not bring valuables to the hospital. [place].   Contacts, dentures or bridgework may not be worn into surgery.   Bring small overnight bag day of surgery.    Patients discharged on the day of surgery will not be allowed to drive home.  Someone needs to stay with you for the first 24 hours after anesthesia.   Special Instructions: Bring a copy of your healthcare power of attorney and living will documents         the day of surgery if you haven't scanned them before.              Please read over the following fact sheets you were given: IF YOU HAVE QUESTIONS ABOUT YOUR PRE-OP INSTRUCTIONS PLEASE CALL [phone]     [facility] - Preparing for Surgery Before surgery, you can play an important role.  Because skin is not sterile, your skin needs to be as free of germs as possible.  You can reduce the number of germs on your skin by washing with CHG (chlorahexidine gluconate) soap before surgery.  CHG is an antiseptic cleaner which kills germs and bonds with the skin to continue killing germs even after washing. Please DO NOT use if you have an allergy to CHG or antibacterial soaps.  If your skin becomes reddened/irritated stop using the CHG and inform your nurse when you arrive at Short Stay. Do not shave (including legs and underarms) for at least 48 hours prior to the first CHG shower.  You may shave your face/neck. Please follow these instructions carefully:  1.  Shower with CHG Soap the night before surgery and the  morning of Surgery.  2.  If you choose to wash your hair, wash your hair first as usual with your  normal  shampoo.  3.  After you shampoo,  rinse your hair and body thoroughly to remove the  shampoo.                           4.  Use CHG as you would any other liquid soap.  You can apply chg directly  to the skin and wash                       Gently with a scrungie or clean washcloth.  5.  Apply the CHG Soap to your body ONLY FROM THE NECK DOWN.   Do not use on face/ open                           Wound or open sores. Avoid contact with eyes, ears mouth and genitals (private parts).  Wash face,  Genitals (private parts) with your normal soap.             6.  Wash thoroughly, paying special attention to the area where your surgery  will be performed.  7.  Thoroughly rinse your body with warm water from the neck down.  8.  DO NOT shower/wash with your normal soap after using and rinsing off  the CHG Soap.                9.  Pat yourself dry with a clean towel.            10.  Wear clean pajamas.            11.  Place clean sheets on your bed the night of your first shower and do not  sleep with pets. Day of Surgery : Do not apply any lotions/deodorants the morning of surgery.  Please wear clean clothes to the hospital/surgery center.  FAILURE TO FOLLOW THESE INSTRUCTIONS MAY RESULT IN THE CANCELLATION OF YOUR SURGERY PATIENT SIGNATURE_________________________________  NURSE SIGNATURE__________________________________  ________________________________________________________________________   Adam Phenix  An incentive spirometer is a tool that can help keep your lungs clear and active. This tool measures how well you are filling your lungs with each breath. Taking long deep breaths may help reverse or decrease the chance of developing breathing (pulmonary) problems (especially infection) following: A long period of time when you are unable to move or be active. BEFORE THE PROCEDURE  If the spirometer includes an indicator to show your best effort, your nurse or respiratory therapist will set it to a  desired goal. If possible, sit up straight or lean slightly forward. Try not to slouch. Hold the incentive spirometer in an upright position. INSTRUCTIONS FOR USE  Sit on the edge of your bed if possible, or sit up as far as you can in bed or on a chair. Hold the incentive spirometer in an upright position. Breathe out normally. Place the mouthpiece in your mouth and seal your lips tightly around it. Breathe in slowly and as deeply as possible, raising the piston or the ball toward the top of the column. Hold your breath for 3-5 seconds or for as long as possible. Allow the piston or ball to fall to the bottom of the column. Remove the mouthpiece from your mouth and breathe out normally. Rest for a few seconds and repeat Steps 1 through 7 at least 10 times every 1-2 hours when you are awake. Take your time and take a few normal breaths between deep breaths. The spirometer may include an indicator to show your best effort. Use the indicator as a goal to work toward during each repetition. After each set of 10 deep breaths, practice coughing to be sure your lungs are clear. If you have an incision (the cut made at the time of surgery), support your incision when coughing by placing a pillow or rolled up towels firmly against it. Once you are able to get out of bed, walk around indoors and cough well. You may stop using the incentive spirometer when instructed by your caregiver.  RISKS AND COMPLICATIONS Take your time so you do not get dizzy or light-headed. If you are in pain, you may need to take or ask for pain medication before doing incentive spirometry. It is harder to take a deep breath if you are having pain. AFTER USE Rest and breathe slowly and easily. It can be helpful to keep track of  a log of your progress. Your caregiver can provide you with a simple table to help with this. If you are using the spirometer at home, follow these instructions: [place] IF:  You are having  difficultly using the spirometer. You have trouble using the spirometer as often as instructed. Your pain medication is not giving enough relief while using the spirometer. You develop fever of 100.5 F (38.1 C) or higher. SEEK IMMEDIATE MEDICAL CARE IF:  You cough up bloody sputum that had not been present before. You develop fever of 102 F (38.9 C) or greater. You develop worsening pain at or near the incision site. MAKE SURE YOU:  Understand these instructions. Will watch your condition. Will get help right away if you are not doing well or get worse. Document Released: 08/18/2006 Document Revised: 06/30/2011 Document Reviewed: 10/19/2006 [facility] Patient Information 2014 [place], [place].   ________________________________________________________________________

## 2021-05-23 NOTE — Telephone Encounter (Signed)
Mr Chill is scheduled for a hip replacement 06/03/21. [facility] is discharging for therapies PT/OT due to unable to progress related to this scheduled surgery.

## 2021-05-27 ENCOUNTER — Encounter (HOSPITAL_COMMUNITY): Payer: Self-pay

## 2021-05-27 ENCOUNTER — Ambulatory Visit (HOSPITAL_COMMUNITY)
Admission: RE | Admit: 2021-05-27 | Discharge: 2021-05-27 | Disposition: A | Discharge status: Home / Self Care | Payer: Medicare HMO | Source: Ambulatory Visit | Attending: Orthopedic Surgery | Admitting: Orthopedic Surgery

## 2021-05-27 ENCOUNTER — Encounter (HOSPITAL_COMMUNITY)
Admission: RE | Admit: 2021-05-27 | Discharge: 2021-05-27 | Disposition: A | Discharge status: Home / Self Care | Payer: Medicare HMO | Source: Ambulatory Visit | Attending: Orthopedic Surgery | Admitting: Orthopedic Surgery

## 2021-05-27 ENCOUNTER — Other Ambulatory Visit: Payer: Self-pay

## 2021-05-27 VITALS — HR 73 | Temp 98.3°F | Ht 67.0 in | Wt 173.0 lb

## 2021-05-27 DIAGNOSIS — Z01818 Encounter for other preprocedural examination: Secondary | ICD-10-CM | POA: Insufficient documentation

## 2021-05-27 DIAGNOSIS — E119 Type 2 diabetes mellitus without complications: Secondary | ICD-10-CM | POA: Insufficient documentation

## 2021-05-27 DIAGNOSIS — I10 Essential (primary) hypertension: Secondary | ICD-10-CM

## 2021-05-27 DIAGNOSIS — J9811 Atelectasis: Secondary | ICD-10-CM | POA: Diagnosis not present

## 2021-05-27 HISTORY — DX: Pneumonia, unspecified organism: J18.9

## 2021-05-27 HISTORY — DX: Cardiac arrhythmia, unspecified: I49.9

## 2021-05-27 LAB — BASIC METABOLIC PANEL
Anion gap: 10 (ref 5–15)
BUN: 15 mg/dL (ref 8–23)
CO2: 30 mmol/L (ref 22–32)
Calcium: 9.6 mg/dL (ref 8.9–10.3)
Chloride: 103 mmol/L (ref 98–111)
Creatinine, Ser: 0.55 mg/dL — ABNORMAL LOW (ref 0.61–1.24)
GFR, Estimated: >60 mL/min (ref >60)
Glucose, Bld: 81 mg/dL (ref 70–99)
Potassium: 4.2 mmol/L (ref 3.5–5.1)
Sodium: 143 mmol/L (ref 135–145)

## 2021-05-27 LAB — CBC
HCT: 39.9 % (ref 39.0–52.0)
Hemoglobin: 12.8 g/dL — ABNORMAL LOW (ref 13.0–17.0)
MCH: 32.6 pg (ref 26.0–34.0)
MCHC: 32.1 g/dL (ref 30.0–36.0)
MCV: 101.5 fL — ABNORMAL HIGH (ref 80.0–100.0)
Platelets: 320 10*3/uL (ref 150–400)
RBC: 3.93 MIL/uL — ABNORMAL LOW (ref 4.22–5.81)
RDW: 15.3 % (ref 11.5–15.5)
WBC: 8.6 10*3/uL (ref 4.0–10.5)
nRBC: 0 % (ref 0.0–0.2)

## 2021-05-27 LAB — GLUCOSE, CAPILLARY: Glucose-Capillary: 81 mg/dL (ref 70–99)

## 2021-05-27 LAB — SURGICAL PCR SCREEN
MRSA, PCR: NEGATIVE
Staphylococcus aureus: NEGATIVE

## 2021-05-27 LAB — HEMOGLOBIN A1C
Hgb A1c MFr Bld: 5.8 % — ABNORMAL HIGH (ref 4.8–5.6)
Mean Plasma Glucose: 119.76 mg/dL

## 2021-05-27 IMAGING — CR DG CHEST 2V
2 series · 2 of 2 positions shown · non-contrast
Comparison: [DATE]

CLINICAL DATA: Preop evaluation, upcoming hip replacement

EXAM:
CHEST - 2 VIEW

[w chest lat]
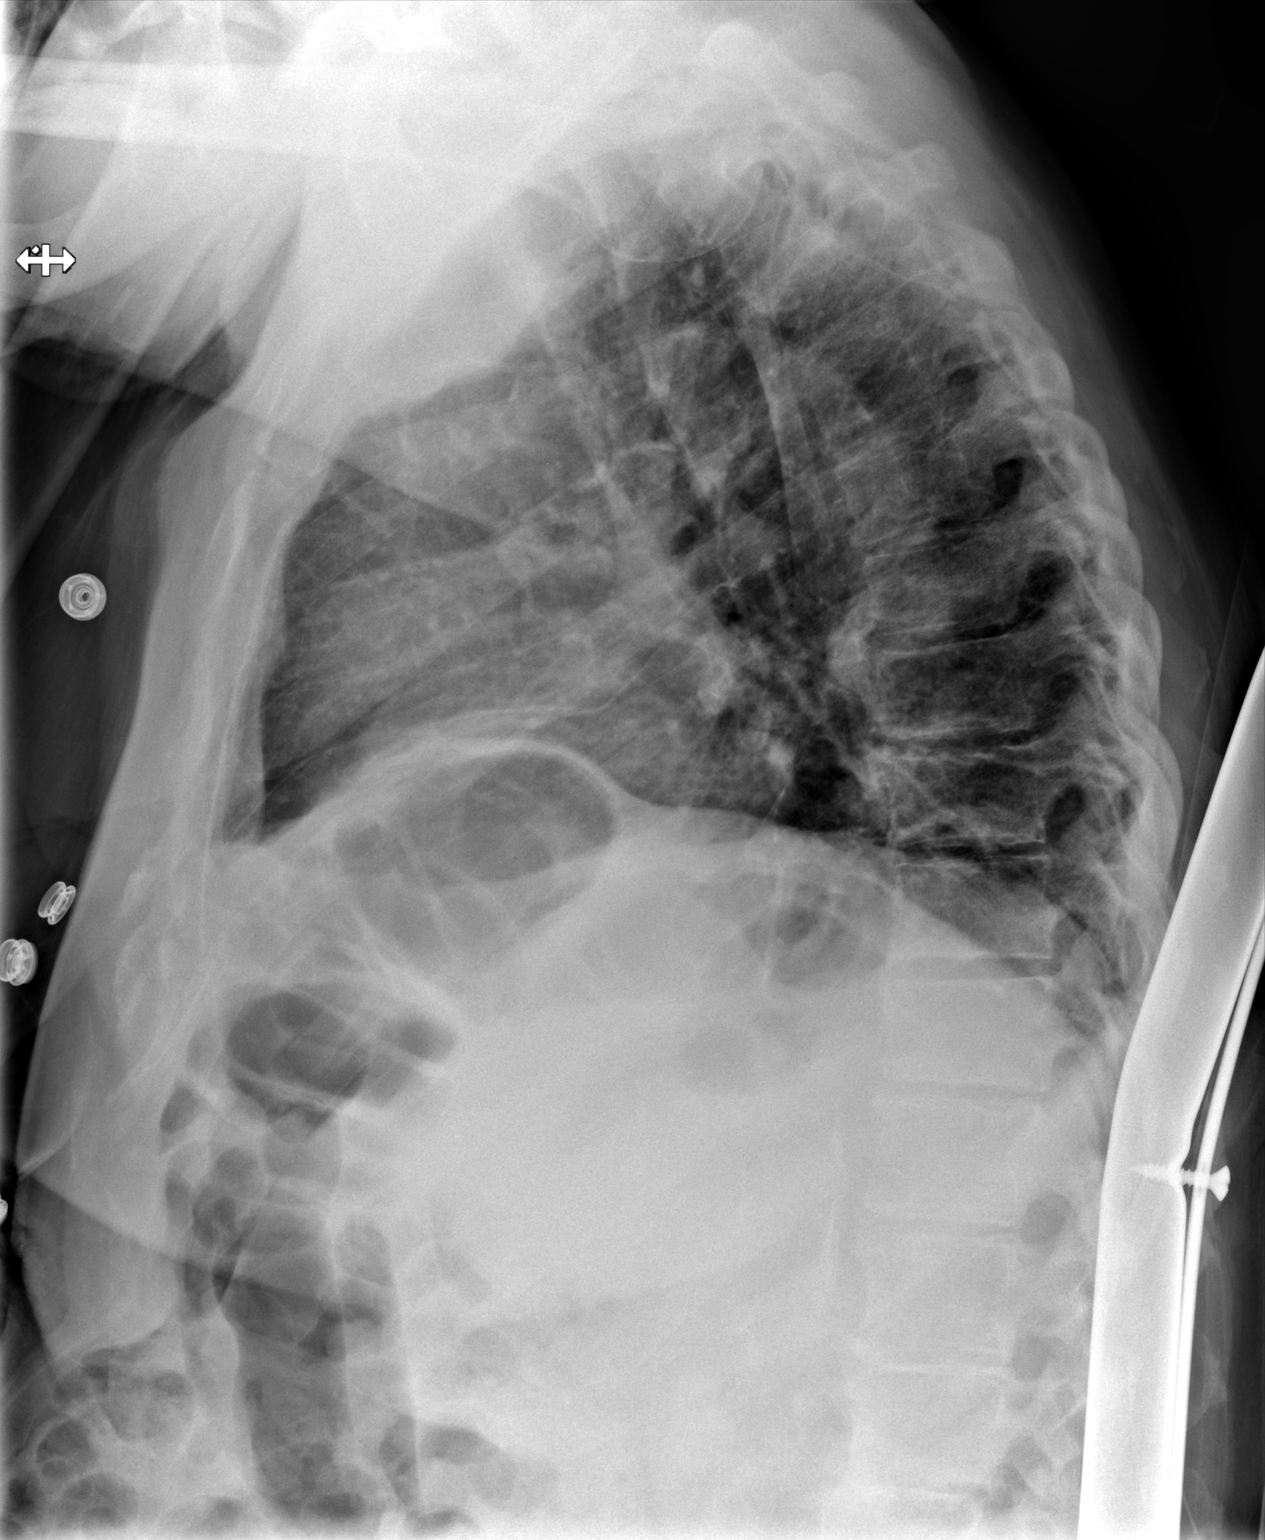

[x chest ap]
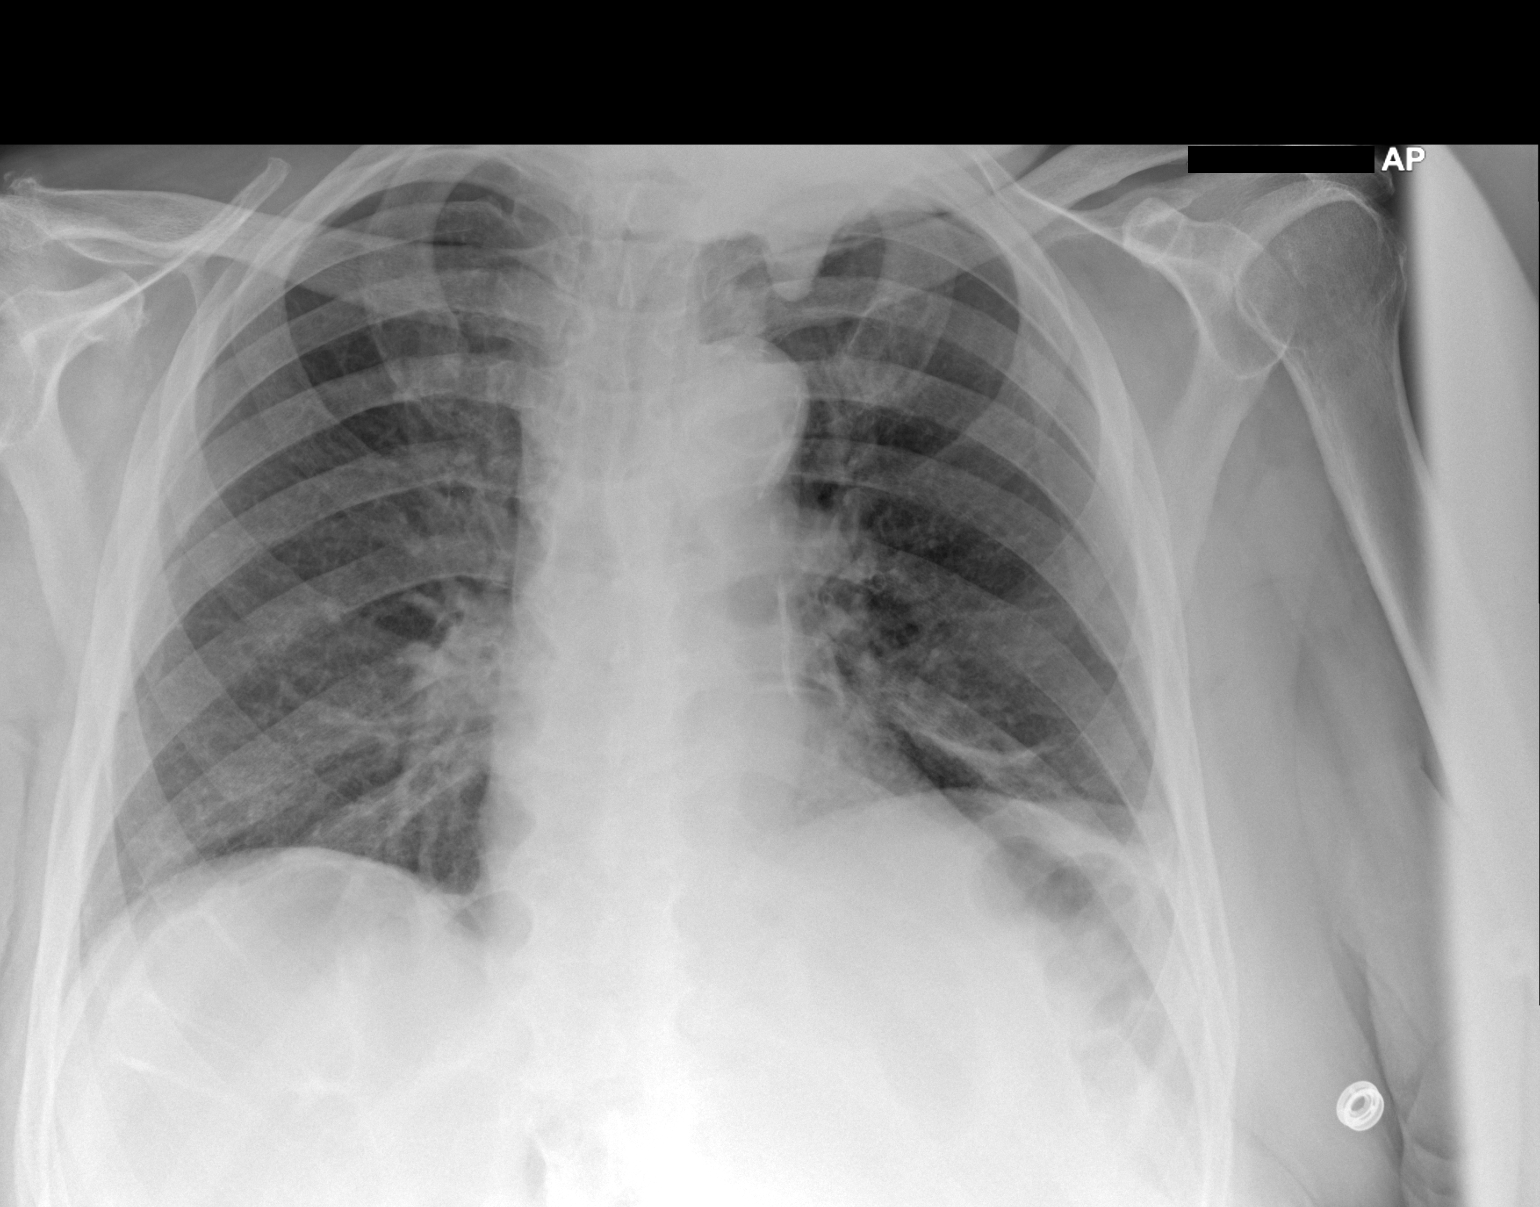

[2 of 2 positions shown; findings below may reference images not displayed]

FINDINGS: Cardiac shadow is within normal limits. Aortic calcifications are
noted. Atelectatic changes are noted in the left base. No sizable
infiltrate or effusion is noted. Degenerative changes of the
thoracic spine are noted.
IMPRESSION: Mild left basilar atelectasis.

## 2021-05-27 NOTE — Progress Notes (Addendum)
COVID Vaccine Completed:Yes Date COVID Vaccine completed: 2021 x z COVID vaccine manufacturer: [place] Test: 05/30/21 @ 9:15 AM Bowel prep reminder: N/A  PCP - Dr. [name] Cardiologist - Dr. Berniece Salines. Jossie Ng Cleaver: NP-C: 05/07/21: EPIC  Chest x-ray - 01/03/21 EKG - 12/11/20 Stress Test -  ECHO - 09/13/19 Cardiac Cath -  Pacemaker/ICD device last checked:  Sleep Study - Yes CPAP - NO  Fasting Blood Sugar - N/A Checks Blood Sugar __0___ times a day  Blood Thinner Instructions: Eliquis will be held 3 days before surgery as per Deberah Pelton PA-C note. Pt. And son are aware. Aspirin Instructions: Last Dose:  Anesthesia review: Hx: Afib,HTN,HF,DIA,OSA.  Patient denies shortness of breath, fever, cough and chest pain at PAT appointment   Patient verbalized understanding of instructions that were given to them at the PAT appointment. Patient was also instructed that they will need to review over the PAT instructions again at home before surgery.

## 2021-05-28 NOTE — Care Plan (Signed)
Ortho Bundle Case Management Note  Patient Details  Name: Logan [name] MRN: [id number] Date of Birth: 1945-06-17   Spoke with patient prior to surgery. He will discharge to home with son to assist. Has walker at home. HHPT referral to [facility] and OPPT set up with [facility].  Patient and MD in agreement with plan. Choice offered                   DME Arranged:    DME Agency:     HH Arranged:  PT HH Agency:  [place]  Additional Comments: Please contact me with any questions of if this plan should need to change.  Ladell Heads,  [place] Orthopaedic Specialist  [phone] 05/28/2021, 10:51 AM

## 2021-05-29 DIAGNOSIS — E1169 Type 2 diabetes mellitus with other specified complication: Secondary | ICD-10-CM | POA: Diagnosis not present

## 2021-05-29 DIAGNOSIS — Z79899 Other long term (current) drug therapy: Secondary | ICD-10-CM | POA: Diagnosis not present

## 2021-05-29 DIAGNOSIS — Z6828 Body mass index (BMI) 28.0-28.9, adult: Secondary | ICD-10-CM | POA: Diagnosis not present

## 2021-05-29 DIAGNOSIS — I10 Essential (primary) hypertension: Secondary | ICD-10-CM | POA: Diagnosis not present

## 2021-05-29 DIAGNOSIS — I4891 Unspecified atrial fibrillation: Secondary | ICD-10-CM | POA: Diagnosis not present

## 2021-05-29 NOTE — H&P (Signed)
TOTAL HIP ADMISSION H&P  Patient is admitted for left total hip arthroplasty.  Subjective:  Chief Complaint: left hip pain  HPI: Logan [name], 76 y.o. male, has a history of pain and functional disability in the left hip(s) due to arthritis and patient has failed non-surgical conservative treatments for greater than 12 weeks to include NSAID's and/or analgesics, use of assistive devices, weight reduction as appropriate, and activity modification.  Onset of symptoms was gradual starting  several  years ago with gradually worsening course since that time.The patient noted no past surgery on the left hip(s).  Patient currently rates pain in the left hip at 10 out of 10 with activity. Patient has night pain, worsening of pain with activity and weight bearing, trendelenberg gait, pain that interfers with activities of daily living, and pain with passive range of motion. Patient has evidence of periarticular osteophytes and joint space narrowing by imaging studies. This condition presents safety issues increasing the risk of falls.  There is no current active infection.  Patient Active Problem List   Diagnosis Date Noted   Osteoarthritis 03/07/2021   Chronic incomplete quadriplegia ([place]) 03/04/2021   Abnormal gait 03/04/2021   Chronic pain syndrome 03/04/2021   Insomnia due to medical condition 03/04/2021   Shoulder subluxation, right 01/31/2021   Hypoalbuminemia due to protein-calorie malnutrition (HCC)    Hypokalemia    Prediabetes    Cervical disc disease with myelopathy 01/03/2021   Cervical myelopathy ([place]) 01/01/2021   Pneumonia 11/05/2020   Cervical radiculopathy 11/05/2020   Acute on chronic heart failure ([place]) 11/05/2020   Seasonal allergies    Orthopnea    Morbid obesity ([place])    DVT (deep venous thrombosis) ([place])    Bilateral lower extremity edema    Secondary hypercoagulable state ([place]) 03/28/2020   Persistent atrial fibrillation ([place]) 10/21/2019   Essential hypertension  10/21/2019   Chronic diastolic heart failure ([place]) 10/21/2019   Mixed hyperlipidemia 10/21/2019   Obesity (BMI 30-39.9) 08/24/2019   Sleep apnea    Primary osteoarthritis of left hip    Hypertension    Hyperlipidemia    Gout    Esophageal reflux    Diabetes mellitus without complication (HCC)    Atrial fibrillation (HCC)    Anxiety    Atherosclerotic renal artery stenosis, unilateral ([place]) 02/28/2014   Past Medical History:  Diagnosis Date   Anxiety    Atherosclerotic renal artery stenosis, unilateral (HCC) 02/28/2014   Atrial fibrillation (HCC)    Bilateral lower extremity edema    Chronic diastolic heart failure ([place]) 10/21/2019   Diabetes mellitus without complication (HCC)    DVT (deep venous thrombosis) (HCC)    Dysrhythmia    Esophageal reflux    Essential hypertension 10/21/2019   Gout    Hyperlipidemia    Hypertension    Mixed hyperlipidemia 10/21/2019   Morbid obesity (HCC)    Obesity (BMI 30-39.9) 08/24/2019   Orthopnea    Osteoarthritis    Persistent atrial fibrillation ([place]) 10/21/2019   Pneumonia    Seasonal allergies    Sleep apnea     Past Surgical History:  Procedure Laterality Date   ABDOMINAL AORTAGRAM N/A 02/28/2014   Procedure: ABDOMINAL Maxcine Ham;  Surgeon: Serafina Mitchell, MD;  Location: [facility] CATH LAB;  Service: Cardiovascular;  Laterality: N/A;   ANTERIOR CERVICAL DECOMP/DISCECTOMY FUSION N/A 01/01/2021   Procedure: ACDF C3-4;  Surgeon: Dawley, Theodoro Doing, DO;  Location: [facility];  Service: Neurosurgery;  Laterality: N/A;   APPENDECTOMY     ATRIAL  FIBRILLATION ABLATION N/A 02/29/2020   Procedure: ATRIAL FIBRILLATION ABLATION;  Surgeon: Constance Haw, MD;  Location: [facility] CV LAB;  Service: Cardiovascular;  Laterality: N/A;   CARDIOVERSION N/A 10/18/2020   Procedure: CARDIOVERSION (CATH LAB);  Surgeon: Constance Haw, MD;  Location: [facility] CV LAB;  Service: Cardiovascular;  Laterality: N/A;   CARPAL TUNNEL RELEASE Left     No  current facility-administered medications for this encounter.   Current Outpatient Medications  Medication Sig Dispense Refill Last Dose   acetaminophen (TYLENOL) 500 MG tablet Take 1,000 mg by mouth every 6 (six) hours as needed for mild pain.      allopurinol (ZYLOPRIM) 300 MG tablet Take 300 mg by mouth daily.      apixaban (ELIQUIS) 5 MG TABS tablet Take 1 tablet (5 mg total) by mouth 2 (two) times daily. 60 tablet 0    carvedilol (COREG) 12.5 MG tablet Take 1 tablet (12.5 mg total) by mouth 2 (two) times daily with a meal. 60 tablet 0    DHEA 25 MG CAPS Take 25 mg by mouth daily.      diclofenac Sodium (VOLTAREN) 1 % GEL Apply 2 g topically 4 (four) times daily. To left hip (Patient taking differently: Apply 2 g topically 4 (four) times daily as needed (pain). To left hip) 400 g 0    fluticasone (FLONASE) 50 MCG/ACT nasal spray Place 1 spray into both nostrils daily as needed for rhinitis.       furosemide (LASIX) 40 MG tablet Take 1 tablet (40 mg total) by mouth daily. 30 tablet 0    loratadine (CLARITIN) 10 MG tablet Take 10 mg by mouth daily as needed for allergies.      losartan (COZAAR) 50 MG tablet Take 1 tablet (50 mg total) by mouth daily. 30 tablet 0    Menthol, Topical Analgesic, (ICY HOT EX) Apply 1 application topically daily as needed (pain).      Multiple Vitamin (MULTIVITAMIN) tablet Take 1 tablet by mouth daily.      omeprazole (PRILOSEC) 20 MG capsule Take 20 mg by mouth daily as needed (for heartburn).       potassium chloride SA (KLOR-CON M) 20 MEQ tablet Take 20 mEq by mouth daily.      simethicone (MYLICON) 80 MG chewable tablet Chew 2 tablets (160 mg total) by mouth 4 (four) times daily. (Patient taking differently: Chew 160 mg by mouth 4 (four) times daily as needed for flatulence.) 120 tablet 0    traMADol (ULTRAM) 50 MG tablet Take 2 tablets (100 mg total) by mouth every 8 (eight) hours as needed for moderate pain. 180 tablet 1    traZODone (DESYREL) 50 MG tablet  Take 1 tablet (50 mg total) by mouth at bedtime as needed for sleep. 30 tablet 5    ascorbic acid (VITAMIN C) 500 MG tablet Take 1 tablet (500 mg total) by mouth daily. (Patient not taking: Reported on 05/22/2021) 30 tablet 0 Not Taking   potassium chloride (KLOR-CON) 10 MEQ tablet Take 2 tablets (20 mEq total) by mouth 2 (two) times daily. (Patient not taking: Reported on 05/22/2021) 120 tablet 0 Not Taking   No Known Allergies  Social History   Tobacco Use   Smoking status: Former   Smokeless tobacco: Former    Types: Chew  Substance Use Topics   Alcohol use: Never    Family History  Problem Relation Age of Onset   Heart disease Mother    Cancer Father  throat cancer   Heart disease Brother    Atrial fibrillation Brother      Review of Systems  Constitutional:  Positive for chills, fatigue and fever.  HENT:  Positive for sinus pain.   Cardiovascular:        HTN  Gastrointestinal:  Positive for constipation.  Neurological:  Positive for weakness.  Psychiatric/Behavioral:  Positive for sleep disturbance.    Objective:  Physical Exam Constitutional:      Appearance: Normal appearance. He is normal weight.  HENT:     Head: Normocephalic and atraumatic.     Nose: Nose normal.  Eyes:     Pupils: Pupils are equal, round, and reactive to light.  Cardiovascular:     Pulses: Normal pulses.  Pulmonary:     Effort: Pulmonary effort is normal.  Musculoskeletal:     Cervical back: Normal range of motion and neck supple.     Comments: Patient is seated within a wheelchair.  There is an obvious leg length discrepancy with the left leg at least 2 cm shoulder, which he does wear a shoe lift.  There is a mild flexor contraction, limited internal rotation about the hip from mechanical block.  He does have good hip flexion strength.  Mildly Painful log roll internally.  Calves are soft and nontender.  Skin:    General: Skin is warm and dry.  Neurological:     General: No focal  deficit present.     Mental Status: He is alert and oriented to person, place, and time. Mental status is at baseline.  Psychiatric:        Mood and Affect: Mood normal.        Behavior: Behavior normal.        Thought Content: Thought content normal.        Judgment: Judgment normal.    Vital signs in last 24 hours:    Labs:   Estimated body mass index is 27.1 kg/m as calculated from the following:   Height as of 05/27/21: 5\' 7"  (1.702 m).   Weight as of 05/27/21: 78.5 kg.   Imaging Review CT-scan demonstrates severe degenerative changes of the left hip with significant bony remodeling and flattening of the femoral head.  There does appear to be evidence of necrosis of the left femoral head with significant varus deformity.      Assessment/Plan:  End stage arthritis, left hip(s)  The patient history, physical examination, clinical judgement of the provider and imaging studies are consistent with end stage degenerative joint disease of the left hip(s) and total hip arthroplasty is deemed medically necessary. The treatment options including medical management, injection therapy, arthroscopy and arthroplasty were discussed at length. The risks and benefits of total hip arthroplasty were presented and reviewed. The risks due to aseptic loosening, infection, stiffness, dislocation/subluxation,  thromboembolic complications and other imponderables were discussed.  The patient acknowledged the explanation, agreed to proceed with the plan and consent was signed. Patient is being admitted for inpatient treatment for surgery, pain control, PT, OT, prophylactic antibiotics, VTE prophylaxis, progressive ambulation and ADL's and discharge planning.The patient is planning to be discharged home with home health services    Patient's anticipated LOS is less than 2 midnights, meeting these requirements: - Younger than 91 - Lives within 1 hour of care - Has a competent adult at home to recover with  post-op recover - NO history of  - Chronic pain requiring opiods  - Diabetes  - Coronary Artery Disease  -  Heart failure  - Heart attack  - Stroke  - DVT/VTE  - Cardiac arrhythmia  - Respiratory Failure/COPD  - Renal failure  - Anemia  - Advanced Liver disease

## 2021-05-30 ENCOUNTER — Encounter (HOSPITAL_COMMUNITY)
Admission: RE | Admit: 2021-05-30 | Discharge: 2021-05-30 | Disposition: A | Discharge status: Home / Self Care | Payer: Medicare HMO | Source: Ambulatory Visit | Attending: Orthopedic Surgery | Admitting: Orthopedic Surgery

## 2021-05-30 ENCOUNTER — Other Ambulatory Visit: Payer: Self-pay

## 2021-05-30 DIAGNOSIS — Z20822 Contact with and (suspected) exposure to covid-19: Secondary | ICD-10-CM | POA: Diagnosis not present

## 2021-05-30 DIAGNOSIS — Z01812 Encounter for preprocedural laboratory examination: Secondary | ICD-10-CM | POA: Diagnosis not present

## 2021-05-30 DIAGNOSIS — Z01818 Encounter for other preprocedural examination: Secondary | ICD-10-CM

## 2021-05-30 LAB — SARS CORONAVIRUS 2 (TAT 6-24 HRS): SARS Coronavirus 2: NEGATIVE

## 2021-06-02 NOTE — Anesthesia Preprocedure Evaluation (Addendum)
Anesthesia Evaluation  Patient identified by MRN, date of birth, ID band Patient awake    Reviewed: Allergy & Precautions, NPO status   Airway Mallampati: I       Dental  (+) Edentulous Upper, Edentulous Lower   Pulmonary sleep apnea and Continuous Positive Airway Pressure Ventilation , former smoker,    Pulmonary exam normal        Cardiovascular hypertension, Pt. on medications and Pt. on home beta blockers Normal cardiovascular exam+ dysrhythmias Atrial Fibrillation      Neuro/Psych Anxiety    GI/Hepatic Neg liver ROS, GERD  Medicated,  Endo/Other  diabetes  Renal/GU negative Renal ROS  negative genitourinary   Musculoskeletal  (+) Arthritis , Osteoarthritis,    Abdominal Normal abdominal exam  (+)   Peds  Hematology negative hematology ROS (+)   Anesthesia Other Findings   Reproductive/Obstetrics                            Anesthesia Physical Anesthesia Plan  ASA: 3  Anesthesia Plan: Spinal   Post-op Pain Management: Dilaudid IV   Induction:   PONV Risk Score and Plan: 2 and Ondansetron, Propofol infusion and TIVA  Airway Management Planned: Natural Airway and Simple Face Mask  Additional Equipment: None  Intra-op Plan:   Post-operative Plan:   Informed Consent: I have reviewed the patients History and Physical, chart, labs and discussed the procedure including the risks, benefits and alternatives for the proposed anesthesia with the patient or authorized representative who has indicated his/her understanding and acceptance.       Plan Discussed with: CRNA  Anesthesia Plan Comments:        Anesthesia Quick Evaluation

## 2021-06-03 ENCOUNTER — Encounter (HOSPITAL_COMMUNITY): Payer: Self-pay | Admitting: Orthopedic Surgery

## 2021-06-03 ENCOUNTER — Other Ambulatory Visit: Payer: Self-pay

## 2021-06-03 ENCOUNTER — Ambulatory Visit (HOSPITAL_COMMUNITY): Payer: Medicare HMO | Admitting: Anesthesiology

## 2021-06-03 ENCOUNTER — Observation Stay (HOSPITAL_COMMUNITY)
Admission: RE | Admit: 2021-06-03 | Discharge: 2021-06-06 | Disposition: A | Discharge status: Skilled Nursing Facility | Payer: Medicare HMO | Source: Ambulatory Visit | Attending: Orthopedic Surgery | Admitting: Orthopedic Surgery

## 2021-06-03 ENCOUNTER — Ambulatory Visit (HOSPITAL_COMMUNITY): Payer: Medicare HMO

## 2021-06-03 ENCOUNTER — Ambulatory Visit (HOSPITAL_BASED_OUTPATIENT_CLINIC_OR_DEPARTMENT_OTHER): Payer: Medicare HMO | Admitting: Anesthesiology

## 2021-06-03 ENCOUNTER — Encounter (HOSPITAL_COMMUNITY)
Admission: RE | Disposition: A | Discharge status: Skilled Nursing Facility | Payer: Self-pay | Source: Ambulatory Visit | Attending: Orthopedic Surgery

## 2021-06-03 DIAGNOSIS — I10 Essential (primary) hypertension: Secondary | ICD-10-CM | POA: Diagnosis not present

## 2021-06-03 DIAGNOSIS — Z96642 Presence of left artificial hip joint: Secondary | ICD-10-CM

## 2021-06-03 DIAGNOSIS — E119 Type 2 diabetes mellitus without complications: Secondary | ICD-10-CM | POA: Diagnosis not present

## 2021-06-03 DIAGNOSIS — M1612 Unilateral primary osteoarthritis, left hip: Secondary | ICD-10-CM

## 2021-06-03 DIAGNOSIS — I5032 Chronic diastolic (congestive) heart failure: Secondary | ICD-10-CM | POA: Insufficient documentation

## 2021-06-03 DIAGNOSIS — Z86718 Personal history of other venous thrombosis and embolism: Secondary | ICD-10-CM | POA: Diagnosis not present

## 2021-06-03 DIAGNOSIS — Z20822 Contact with and (suspected) exposure to covid-19: Secondary | ICD-10-CM | POA: Diagnosis not present

## 2021-06-03 DIAGNOSIS — M9112 Juvenile osteochondrosis of head of femur [Legg-Calve-Perthes], left leg: Secondary | ICD-10-CM | POA: Insufficient documentation

## 2021-06-03 DIAGNOSIS — I4819 Other persistent atrial fibrillation: Secondary | ICD-10-CM | POA: Diagnosis not present

## 2021-06-03 DIAGNOSIS — Z471 Aftercare following joint replacement surgery: Secondary | ICD-10-CM | POA: Diagnosis not present

## 2021-06-03 DIAGNOSIS — Z87891 Personal history of nicotine dependence: Secondary | ICD-10-CM | POA: Insufficient documentation

## 2021-06-03 DIAGNOSIS — G4733 Obstructive sleep apnea (adult) (pediatric): Secondary | ICD-10-CM | POA: Diagnosis not present

## 2021-06-03 DIAGNOSIS — Z419 Encounter for procedure for purposes other than remedying health state, unspecified: Secondary | ICD-10-CM

## 2021-06-03 DIAGNOSIS — I110 Hypertensive heart disease with heart failure: Secondary | ICD-10-CM | POA: Insufficient documentation

## 2021-06-03 DIAGNOSIS — Z7901 Long term (current) use of anticoagulants: Secondary | ICD-10-CM | POA: Insufficient documentation

## 2021-06-03 DIAGNOSIS — Z9989 Dependence on other enabling machines and devices: Secondary | ICD-10-CM | POA: Diagnosis not present

## 2021-06-03 DIAGNOSIS — Z79899 Other long term (current) drug therapy: Secondary | ICD-10-CM | POA: Diagnosis not present

## 2021-06-03 HISTORY — PX: TOTAL HIP ARTHROPLASTY: SHX124

## 2021-06-03 HISTORY — DX: Presence of left artificial hip joint: Z96.642

## 2021-06-03 LAB — TYPE AND SCREEN
ABO/RH(D): O POS
Antibody Screen: NEGATIVE

## 2021-06-03 LAB — GLUCOSE, CAPILLARY: Glucose-Capillary: 102 mg/dL — ABNORMAL HIGH (ref 70–99)

## 2021-06-03 IMAGING — RF DG HIP (WITH PELVIS) OPERATIVE*L*
1 series · 2 of 2 positions shown · non-contrast
Comparison: Left hip radiographs [DATE]

CLINICAL DATA: Fluoroscopy for anterior left hip replacement.

EXAM:
OPERATIVE  HIP (WITH PELVIS IF PERFORMED)  VIEWS
TECHNIQUE: Fluoroscopic spot image(s) were submitted for interpretation
post-operatively.

[Series 1: unknown protocol · 0.20mm/px · 2 of 2 slices shown]
[im 1/2]
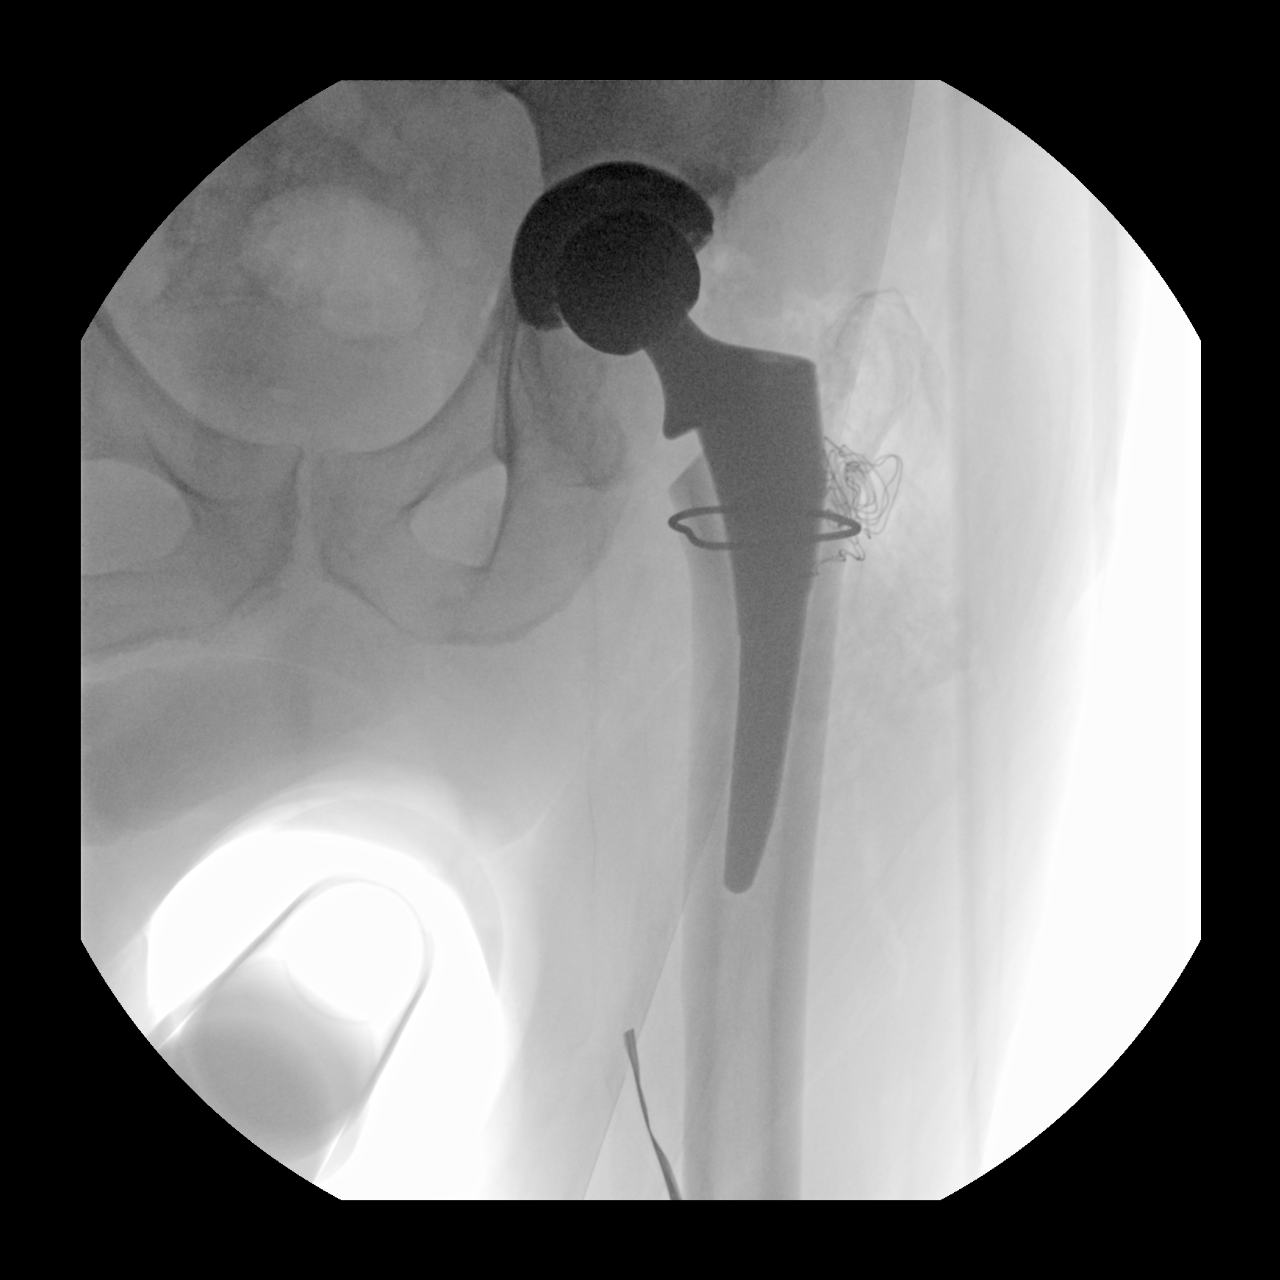
[im 2/2]
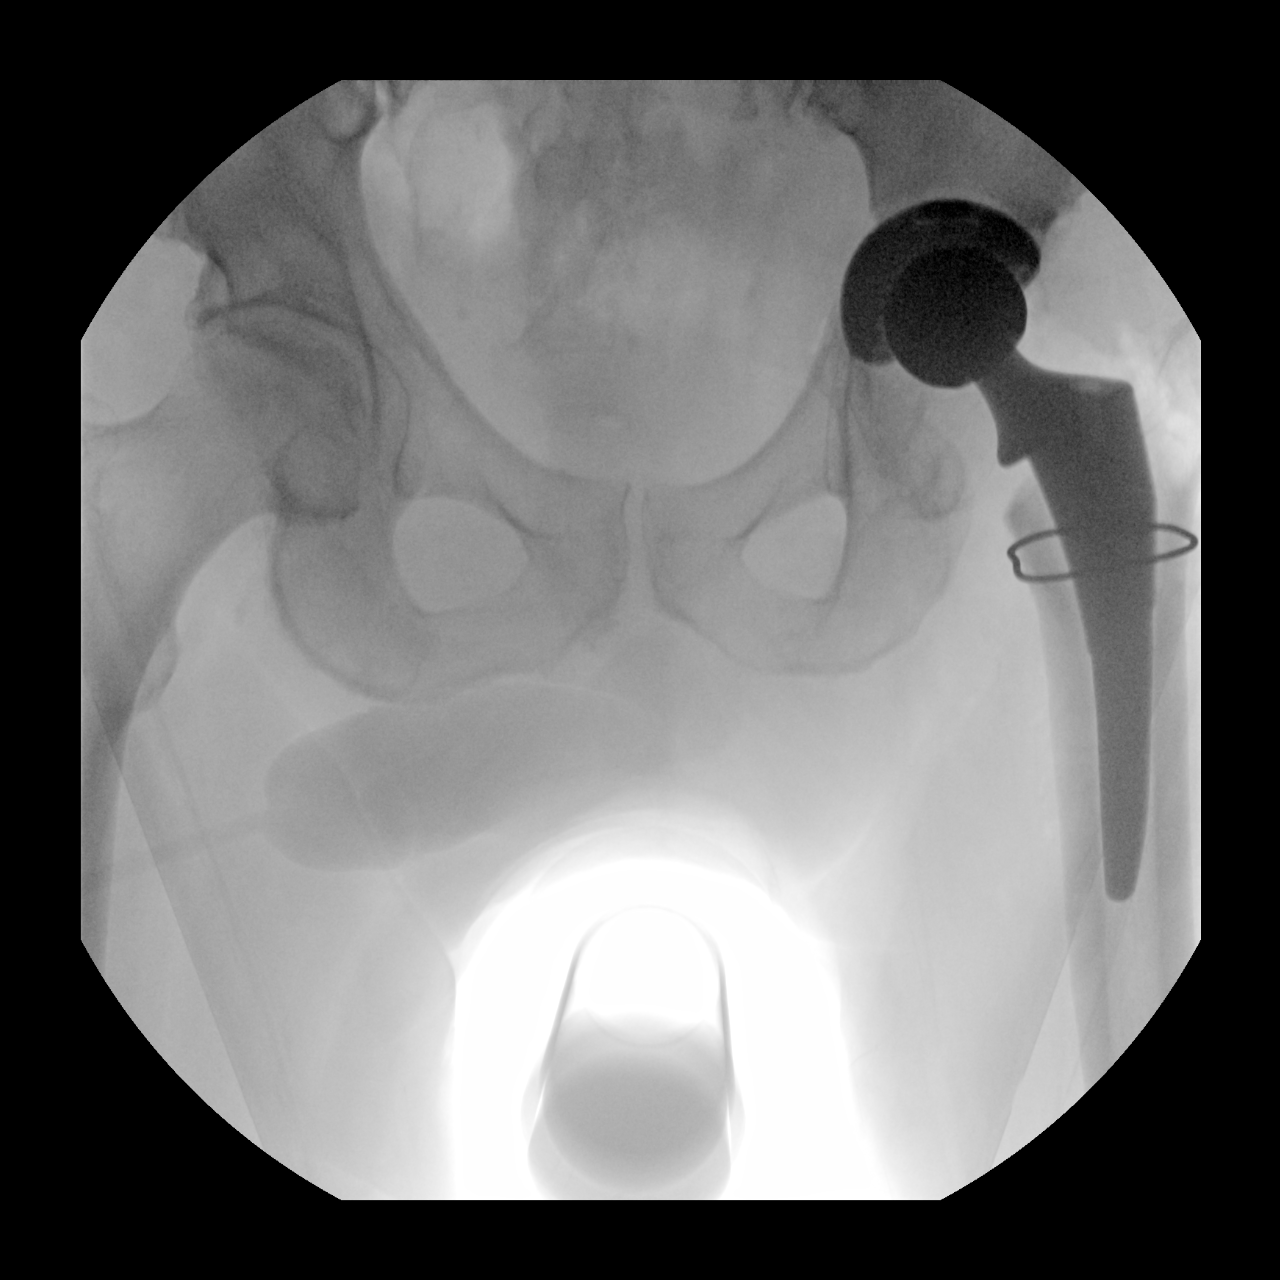

[2 of 2 positions shown; findings below may reference images not displayed]

FINDINGS: Images were performed intraoperatively without the presence of a
radiologist. The patient appears to be undergoing total left hip
arthroplasty. A single cerclage wire overlies the intertrochanteric
region of the left femur.

Please see intraoperative findings for further detail.
IMPRESSION: Fluoroscopy for left hip arthroplasty.

## 2021-06-03 SURGERY — ARTHROPLASTY, HIP, TOTAL, ANTERIOR APPROACH
Anesthesia: Spinal | Site: Hip | Laterality: Left

## 2021-06-03 MED ORDER — BUPIVACAINE LIPOSOME 1.3 % IJ SUSP: 10.0000 mL | Freq: Once | INTRAMUSCULAR | Status: DC

## 2021-06-03 MED ORDER — METHOCARBAMOL 1000 MG/10ML IJ SOLN
500.0000 mg | Freq: Four times a day (QID) | INTRAVENOUS | Status: DC | PRN
  Filled 2021-06-03: qty 5

## 2021-06-03 MED ORDER — SODIUM CHLORIDE 0.9% FLUSH
INTRAVENOUS | Status: DC | PRN
  Administered 2021-06-03: 50 mL

## 2021-06-03 MED ORDER — TIZANIDINE HCL 2 MG PO TABS: 2.0000 mg | ORAL_TABLET | Freq: Four times a day (QID) | ORAL | 0 refills | Status: DC | PRN

## 2021-06-03 MED ORDER — BUPIVACAINE LIPOSOME 1.3 % IJ SUSP
INTRAMUSCULAR | Status: AC
  Filled 2021-06-03: qty 10

## 2021-06-03 MED ORDER — METHOCARBAMOL 500 MG PO TABS
500.0000 mg | ORAL_TABLET | Freq: Four times a day (QID) | ORAL | Status: DC | PRN
  Administered 2021-06-03 – 2021-06-06 (×6): 500 mg via ORAL
  Filled 2021-06-03 (×7): qty 1

## 2021-06-03 MED ORDER — MENTHOL 3 MG MT LOZG: 1.0000 | LOZENGE | OROMUCOSAL | Status: DC | PRN

## 2021-06-03 MED ORDER — TRANEXAMIC ACID-NACL 1000-0.7 MG/100ML-% IV SOLN
1000.0000 mg | INTRAVENOUS | Status: AC
  Administered 2021-06-03: 1000 mg via INTRAVENOUS
  Filled 2021-06-03: qty 100

## 2021-06-03 MED ORDER — FENTANYL CITRATE (PF) 100 MCG/2ML IJ SOLN
INTRAMUSCULAR | Status: AC
  Filled 2021-06-03: qty 2

## 2021-06-03 MED ORDER — FLEET ENEMA 7-19 GM/118ML RE ENEM: 1.0000 | ENEMA | Freq: Once | RECTAL | Status: DC | PRN

## 2021-06-03 MED ORDER — TRANEXAMIC ACID 1000 MG/10ML IV SOLN
INTRAVENOUS | Status: DC | PRN
  Administered 2021-06-03: 2000 mg via TOPICAL

## 2021-06-03 MED ORDER — MIDAZOLAM HCL 2 MG/2ML IJ SOLN
INTRAMUSCULAR | Status: AC
  Filled 2021-06-03: qty 2

## 2021-06-03 MED ORDER — EPHEDRINE SULFATE (PRESSORS) 50 MG/ML IJ SOLN
INTRAMUSCULAR | Status: DC | PRN
  Administered 2021-06-03: 5 mg via INTRAVENOUS
  Administered 2021-06-03 (×2): 10 mg via INTRAVENOUS
  Administered 2021-06-03: 5 mg via INTRAVENOUS

## 2021-06-03 MED ORDER — PROPOFOL 500 MG/50ML IV EMUL
INTRAVENOUS | Status: DC | PRN
  Administered 2021-06-03: 50 ug/kg/min via INTRAVENOUS

## 2021-06-03 MED ORDER — LOSARTAN POTASSIUM 50 MG PO TABS
50.0000 mg | ORAL_TABLET | Freq: Every day | ORAL | Status: DC
  Administered 2021-06-03 – 2021-06-06 (×4): 50 mg via ORAL
  Filled 2021-06-03 (×4): qty 1

## 2021-06-03 MED ORDER — ONDANSETRON HCL 4 MG/2ML IJ SOLN
INTRAMUSCULAR | Status: DC | PRN
  Administered 2021-06-03: 4 mg via INTRAVENOUS

## 2021-06-03 MED ORDER — LACTATED RINGERS IV SOLN: INTRAVENOUS | Status: DC

## 2021-06-03 MED ORDER — DOCUSATE SODIUM 100 MG PO CAPS
100.0000 mg | ORAL_CAPSULE | Freq: Two times a day (BID) | ORAL | Status: DC
  Administered 2021-06-03 – 2021-06-06 (×6): 100 mg via ORAL
  Filled 2021-06-03 (×6): qty 1

## 2021-06-03 MED ORDER — SIMETHICONE 80 MG PO CHEW: 160.0000 mg | CHEWABLE_TABLET | Freq: Four times a day (QID) | ORAL | Status: DC | PRN

## 2021-06-03 MED ORDER — ONDANSETRON HCL 4 MG PO TABS: 4.0000 mg | ORAL_TABLET | Freq: Four times a day (QID) | ORAL | Status: DC | PRN

## 2021-06-03 MED ORDER — BUPIVACAINE LIPOSOME 1.3 % IJ SUSP
INTRAMUSCULAR | Status: DC | PRN
  Administered 2021-06-03: 10 mL

## 2021-06-03 MED ORDER — DEXAMETHASONE SODIUM PHOSPHATE 10 MG/ML IJ SOLN
INTRAMUSCULAR | Status: AC
  Filled 2021-06-03: qty 1

## 2021-06-03 MED ORDER — ACETAMINOPHEN 325 MG PO TABS
325.0000 mg | ORAL_TABLET | Freq: Four times a day (QID) | ORAL | Status: DC | PRN
  Administered 2021-06-05 – 2021-06-06 (×3): 650 mg via ORAL
  Filled 2021-06-03 (×3): qty 2

## 2021-06-03 MED ORDER — KCL IN DEXTROSE-NACL 20-5-0.45 MEQ/L-%-% IV SOLN
INTRAVENOUS | Status: DC
  Filled 2021-06-03 (×3): qty 1000

## 2021-06-03 MED ORDER — OXYCODONE HCL 5 MG PO TABS
10.0000 mg | ORAL_TABLET | ORAL | Status: DC | PRN
  Administered 2021-06-04 (×3): 15 mg via ORAL
  Filled 2021-06-03 (×3): qty 3

## 2021-06-03 MED ORDER — CHLORHEXIDINE GLUCONATE 0.12 % MT SOLN
15.0000 mL | Freq: Once | OROMUCOSAL | Status: AC
  Administered 2021-06-03: 15 mL via OROMUCOSAL

## 2021-06-03 MED ORDER — OXYCODONE HCL 5 MG PO TABS
5.0000 mg | ORAL_TABLET | ORAL | Status: DC | PRN
  Administered 2021-06-03 (×2): 5 mg via ORAL
  Administered 2021-06-05 – 2021-06-06 (×2): 10 mg via ORAL
  Filled 2021-06-03 (×3): qty 2
  Filled 2021-06-03 (×2): qty 1

## 2021-06-03 MED ORDER — PHENYLEPHRINE HCL-NACL 20-0.9 MG/250ML-% IV SOLN
INTRAVENOUS | Status: DC | PRN
  Administered 2021-06-03: 40 ug/min via INTRAVENOUS

## 2021-06-03 MED ORDER — ONDANSETRON HCL 4 MG/2ML IJ SOLN: 4.0000 mg | Freq: Four times a day (QID) | INTRAMUSCULAR | Status: DC | PRN

## 2021-06-03 MED ORDER — LIDOCAINE HCL (PF) 2 % IJ SOLN
INTRAMUSCULAR | Status: AC
  Filled 2021-06-03: qty 5

## 2021-06-03 MED ORDER — APIXABAN 2.5 MG PO TABS
2.5000 mg | ORAL_TABLET | Freq: Two times a day (BID) | ORAL | 0 refills | Status: DC
Start: 2021-06-03 — End: 2021-06-05

## 2021-06-03 MED ORDER — METOCLOPRAMIDE HCL 5 MG/ML IJ SOLN: 5.0000 mg | Freq: Three times a day (TID) | INTRAMUSCULAR | Status: DC | PRN

## 2021-06-03 MED ORDER — FUROSEMIDE 40 MG PO TABS
40.0000 mg | ORAL_TABLET | Freq: Every day | ORAL | Status: DC
  Administered 2021-06-04 – 2021-06-06 (×3): 40 mg via ORAL
  Filled 2021-06-03 (×3): qty 1

## 2021-06-03 MED ORDER — APIXABAN 2.5 MG PO TABS
2.5000 mg | ORAL_TABLET | Freq: Two times a day (BID) | ORAL | Status: DC
  Administered 2021-06-04 – 2021-06-06 (×5): 2.5 mg via ORAL
  Filled 2021-06-03 (×5): qty 1

## 2021-06-03 MED ORDER — TRAZODONE HCL 50 MG PO TABS: 50.0000 mg | ORAL_TABLET | Freq: Every evening | ORAL | Status: DC | PRN

## 2021-06-03 MED ORDER — POLYETHYLENE GLYCOL 3350 17 G PO PACK
17.0000 g | PACK | Freq: Every day | ORAL | Status: DC | PRN
  Administered 2021-06-04 – 2021-06-06 (×3): 17 g via ORAL
  Filled 2021-06-03 (×3): qty 1

## 2021-06-03 MED ORDER — TRANEXAMIC ACID-NACL 1000-0.7 MG/100ML-% IV SOLN
1000.0000 mg | Freq: Once | INTRAVENOUS | Status: AC
  Administered 2021-06-03: 1000 mg via INTRAVENOUS
  Filled 2021-06-03: qty 100

## 2021-06-03 MED ORDER — DHEA 25 MG PO CAPS: 25.0000 mg | ORAL_CAPSULE | Freq: Every day | ORAL | Status: DC

## 2021-06-03 MED ORDER — HYDROMORPHONE HCL 1 MG/ML IJ SOLN: 0.2500 mg | INTRAMUSCULAR | Status: DC | PRN

## 2021-06-03 MED ORDER — CARVEDILOL 12.5 MG PO TABS
12.5000 mg | ORAL_TABLET | Freq: Two times a day (BID) | ORAL | Status: DC
  Administered 2021-06-03 – 2021-06-06 (×6): 12.5 mg via ORAL
  Filled 2021-06-03 (×6): qty 1

## 2021-06-03 MED ORDER — HYDROMORPHONE HCL 1 MG/ML IJ SOLN
0.5000 mg | INTRAMUSCULAR | Status: DC | PRN
  Administered 2021-06-04: 1 mg via INTRAVENOUS
  Filled 2021-06-03: qty 1

## 2021-06-03 MED ORDER — MIDAZOLAM HCL 5 MG/5ML IJ SOLN
INTRAMUSCULAR | Status: DC | PRN
  Administered 2021-06-03: 1 mg via INTRAVENOUS

## 2021-06-03 MED ORDER — BUPIVACAINE-EPINEPHRINE 0.25% -1:200000 IJ SOLN
INTRAMUSCULAR | Status: DC | PRN
  Administered 2021-06-03: 30 mL

## 2021-06-03 MED ORDER — PHENOL 1.4 % MT LIQD: 1.0000 | OROMUCOSAL | Status: DC | PRN

## 2021-06-03 MED ORDER — BUPIVACAINE-EPINEPHRINE (PF) 0.25% -1:200000 IJ SOLN
INTRAMUSCULAR | Status: AC
  Filled 2021-06-03: qty 30

## 2021-06-03 MED ORDER — VASOPRESSIN 20 UNIT/ML IV SOLN
INTRAVENOUS | Status: AC
  Filled 2021-06-03: qty 1

## 2021-06-03 MED ORDER — ONDANSETRON HCL 4 MG/2ML IJ SOLN: 4.0000 mg | Freq: Once | INTRAMUSCULAR | Status: DC | PRN

## 2021-06-03 MED ORDER — 0.9 % SODIUM CHLORIDE (POUR BTL) OPTIME
TOPICAL | Status: DC | PRN
  Administered 2021-06-03: 1000 mL

## 2021-06-03 MED ORDER — ALBUMIN HUMAN 5 % IV SOLN
INTRAVENOUS | Status: AC
  Filled 2021-06-03: qty 250

## 2021-06-03 MED ORDER — FENTANYL CITRATE (PF) 100 MCG/2ML IJ SOLN
INTRAMUSCULAR | Status: DC | PRN
Start: 2021-06-03 — End: 2021-06-03
  Administered 2021-06-03: 50 ug via INTRAVENOUS
  Administered 2021-06-03 (×2): 25 ug via INTRAVENOUS

## 2021-06-03 MED ORDER — BISACODYL 5 MG PO TBEC: 5.0000 mg | DELAYED_RELEASE_TABLET | Freq: Every day | ORAL | Status: DC | PRN

## 2021-06-03 MED ORDER — DIPHENHYDRAMINE HCL 12.5 MG/5ML PO ELIX: 12.5000 mg | ORAL_SOLUTION | ORAL | Status: DC | PRN

## 2021-06-03 MED ORDER — ONDANSETRON HCL 4 MG/2ML IJ SOLN
INTRAMUSCULAR | Status: AC
  Filled 2021-06-03: qty 2

## 2021-06-03 MED ORDER — FLUTICASONE PROPIONATE 50 MCG/ACT NA SUSP: 1.0000 | Freq: Every day | NASAL | Status: DC | PRN

## 2021-06-03 MED ORDER — METOCLOPRAMIDE HCL 5 MG PO TABS: 5.0000 mg | ORAL_TABLET | Freq: Three times a day (TID) | ORAL | Status: DC | PRN

## 2021-06-03 MED ORDER — LORATADINE 10 MG PO TABS
10.0000 mg | ORAL_TABLET | Freq: Every day | ORAL | Status: DC | PRN
  Administered 2021-06-03: 10 mg via ORAL
  Filled 2021-06-03: qty 1

## 2021-06-03 MED ORDER — CEFAZOLIN SODIUM-DEXTROSE 2-4 GM/100ML-% IV SOLN
2.0000 g | INTRAVENOUS | Status: AC
  Administered 2021-06-03: 2 g via INTRAVENOUS
  Filled 2021-06-03: qty 100

## 2021-06-03 MED ORDER — BUPIVACAINE IN DEXTROSE 0.75-8.25 % IT SOLN
INTRATHECAL | Status: DC | PRN
  Administered 2021-06-03: 1.6 mL via INTRATHECAL

## 2021-06-03 MED ORDER — ALBUMIN HUMAN 5 % IV SOLN
INTRAVENOUS | Status: DC | PRN
Start: 2021-06-03 — End: 2021-06-03

## 2021-06-03 MED ORDER — PROPOFOL 1000 MG/100ML IV EMUL
INTRAVENOUS | Status: AC
  Filled 2021-06-03: qty 100

## 2021-06-03 MED ORDER — TRANEXAMIC ACID 1000 MG/10ML IV SOLN
2000.0000 mg | INTRAVENOUS | Status: DC
  Filled 2021-06-03: qty 20

## 2021-06-03 MED ORDER — POVIDONE-IODINE 10 % EX SWAB: 2.0000 "application " | Freq: Once | CUTANEOUS | Status: DC

## 2021-06-03 MED ORDER — ALLOPURINOL 300 MG PO TABS
300.0000 mg | ORAL_TABLET | Freq: Every day | ORAL | Status: DC
  Administered 2021-06-04 – 2021-06-06 (×3): 300 mg via ORAL
  Filled 2021-06-03 (×3): qty 1

## 2021-06-03 MED ORDER — DEXAMETHASONE SODIUM PHOSPHATE 10 MG/ML IJ SOLN
10.0000 mg | Freq: Once | INTRAMUSCULAR | Status: AC
  Administered 2021-06-04: 10 mg via INTRAVENOUS
  Filled 2021-06-03: qty 1

## 2021-06-03 MED ORDER — OXYCODONE-ACETAMINOPHEN 5-325 MG PO TABS
1.0000 | ORAL_TABLET | ORAL | 0 refills | Status: DC | PRN
Start: 2021-06-03 — End: 2021-06-05

## 2021-06-03 MED ORDER — ACETAMINOPHEN 500 MG PO TABS
1000.0000 mg | ORAL_TABLET | Freq: Four times a day (QID) | ORAL | Status: AC
  Administered 2021-06-03 – 2021-06-04 (×3): 1000 mg via ORAL
  Filled 2021-06-03 (×3): qty 2

## 2021-06-03 MED ORDER — LIDOCAINE HCL (CARDIAC) PF 100 MG/5ML IV SOSY
PREFILLED_SYRINGE | INTRAVENOUS | Status: DC | PRN
  Administered 2021-06-03: 40 mg via INTRAVENOUS

## 2021-06-03 MED ORDER — DEXAMETHASONE SODIUM PHOSPHATE 10 MG/ML IJ SOLN
INTRAMUSCULAR | Status: DC | PRN
  Administered 2021-06-03: 5 mg via INTRAVENOUS

## 2021-06-03 MED ORDER — PANTOPRAZOLE SODIUM 40 MG PO TBEC
40.0000 mg | DELAYED_RELEASE_TABLET | Freq: Every day | ORAL | Status: DC
  Administered 2021-06-04 – 2021-06-06 (×3): 40 mg via ORAL
  Filled 2021-06-03 (×3): qty 1

## 2021-06-03 MED ORDER — VASOPRESSIN 20 UNIT/ML IV SOLN
INTRAVENOUS | Status: DC | PRN
  Administered 2021-06-03 (×2): 1 [IU] via INTRAVENOUS

## 2021-06-03 MED ORDER — ACETAMINOPHEN 10 MG/ML IV SOLN: 1000.0000 mg | Freq: Once | INTRAVENOUS | Status: DC | PRN

## 2021-06-03 MED ORDER — ORAL CARE MOUTH RINSE: 15.0000 mL | Freq: Once | OROMUCOSAL | Status: AC

## 2021-06-03 MED ORDER — PROPOFOL 10 MG/ML IV BOLUS
INTRAVENOUS | Status: DC | PRN
  Administered 2021-06-03: 10 mg via INTRAVENOUS

## 2021-06-03 SURGICAL SUPPLY — 45 items
BAG COUNTER SPONGE SURGICOUNT (BAG) ×2 IMPLANT
BAG DECANTER FOR FLEXI CONT (MISCELLANEOUS) ×4 IMPLANT
BAG SPNG CNTER NS LX DISP (BAG) ×1
BLADE SAW SGTL 18X1.27X75 (BLADE) ×2 IMPLANT
CABLE CERLAGE W/CRIMP 1.8 (Cable) ×1 IMPLANT
COVER PERINEAL POST (MISCELLANEOUS) ×2 IMPLANT
COVER SURGICAL LIGHT HANDLE (MISCELLANEOUS) ×2 IMPLANT
CUP ACET PINNACLE SECTR 48MM (Joint) IMPLANT
DRAPE STERI IOBAN 125X83 (DRAPES) ×2 IMPLANT
DRAPE U-SHAPE 47X51 STRL (DRAPES) ×4 IMPLANT
DRSG AQUACEL AG ADV 3.5X10 (GAUZE/BANDAGES/DRESSINGS) ×2 IMPLANT
DURAPREP 26ML APPLICATOR (WOUND CARE) ×2 IMPLANT
ELECT BLADE TIP CTD 4 INCH (ELECTRODE) ×2 IMPLANT
ELECT REM PT RETURN 15FT ADLT (MISCELLANEOUS) ×2 IMPLANT
FEM STEM 12/14 TAPER SZ 4 HIP (Orthopedic Implant) ×2 IMPLANT
FEMORAL STEM 12/14 TPR SZ4 HIP (Orthopedic Implant) IMPLANT
GLOVE SRG 8 PF TXTR STRL LF DI (GLOVE) ×1 IMPLANT
GLOVE SURG ENC MOIS LTX SZ7.5 (GLOVE) ×2 IMPLANT
GLOVE SURG ENC MOIS LTX SZ8.5 (GLOVE) ×2 IMPLANT
GLOVE SURG UNDER POLY LF SZ8 (GLOVE) ×2
GLOVE SURG UNDER POLY LF SZ9 (GLOVE) ×2 IMPLANT
GOWN STRL REUS W/TWL XL LVL3 (GOWN DISPOSABLE) ×4 IMPLANT
HEAD FEMORAL 32 CERAMIC (Hips) ×1 IMPLANT
HOLDER FOLEY CATH W/STRAP (MISCELLANEOUS) ×2 IMPLANT
KIT TURNOVER KIT A (KITS) ×1 IMPLANT
MANIFOLD NEPTUNE II (INSTRUMENTS) ×2 IMPLANT
NDL HYPO 21X1.5 SAFETY (NEEDLE) ×2 IMPLANT
NEEDLE HYPO 21X1.5 SAFETY (NEEDLE) ×4 IMPLANT
NS IRRIG 1000ML POUR BTL (IV SOLUTION) ×2 IMPLANT
PACK ANTERIOR HIP CUSTOM (KITS) ×2 IMPLANT
PINN ALTRX NEUT ID X OD 32X48 ×1 IMPLANT
PINNSECTOR W/GRIP ACE CUP 48MM (Joint) ×2 IMPLANT
SPIKE FLUID TRANSFER (MISCELLANEOUS) ×2 IMPLANT
SUT ETHIBOND NAB CT1 #1 30IN (SUTURE) ×2 IMPLANT
SUT VIC AB 0 CT1 27 (SUTURE)
SUT VIC AB 0 CT1 27XBRD ANBCTR (SUTURE) IMPLANT
SUT VIC AB 1 CTX 36 (SUTURE) ×2
SUT VIC AB 1 CTX36XBRD ANBCTR (SUTURE) ×1 IMPLANT
SUT VIC AB 2-0 CT1 27 (SUTURE) ×2
SUT VIC AB 2-0 CT1 TAPERPNT 27 (SUTURE) ×1 IMPLANT
SUT VIC AB 3-0 CT1 27 (SUTURE) ×2
SUT VIC AB 3-0 CT1 TAPERPNT 27 (SUTURE) ×1 IMPLANT
SYR CONTROL 10ML LL (SYRINGE) ×6 IMPLANT
TRAY FOLEY MTR SLVR 16FR STAT (SET/KITS/TRAYS/PACK) ×1 IMPLANT
TUBE SUCTION HIGH CAP CLEAR NV (SUCTIONS) ×1 IMPLANT

## 2021-06-03 NOTE — Transfer of Care (Signed)
Immediate Anesthesia Transfer of Care Note  Patient: Logan [name]  Procedure(s) Performed: LEFT TOTAL HIP ARTHROPLASTY ANTERIOR APPROACH (Left: Hip)  Patient Location: PACU  Anesthesia Type:Spinal  Level of Consciousness: awake, alert , oriented and patient cooperative  Airway & Oxygen Therapy: Patient Spontanous Breathing and Patient connected to face mask oxygen  Post-op Assessment: Report given to RN and Post -op Vital signs reviewed and stable  Post vital signs: Reviewed and stable  Last Vitals:  Vitals Value Taken Time  BP 83/49 06/03/21 1023  Temp    Pulse 62 06/03/21 1028  Resp 20 06/03/21 1028  SpO2 89 % 06/03/21 1028  Vitals shown include unvalidated device data.  Last Pain:  Vitals:   06/03/21 0526  TempSrc: Oral         Complications: No notable events documented.

## 2021-06-03 NOTE — Op Note (Signed)
PATIENT ID:      Logan [name]  MRN:     [id number] DOB/AGE:    07-02-1945 / 76 y.o.  OPERATIVE REPORT   DATE OF PROCEDURE:  06/03/2021      PREOPERATIVE DIAGNOSIS:  LEFT HIP OSTEOARTHRITIS AND LEG CALVES PERTHES                                                         POSTOPERATIVE DIAGNOSIS:  Same                                                         PROCEDURE: Anterior Right total hip arthroplasty using a 48 mm DePuy Gryption sector Cup, [employer], 0-degree polyethylene liner, a +1.5 mm x 65mm ceramic head, a 4 hi Depuy Actis stem  SURGEON: Kerin Salen  ASSISTANT:   Kerry Hough. Barton Dubois  (present throughout entire procedure and necessary for timely completion of the procedure)   ANESTHESIA: Spinal, Exparel 133mg  injection BLOOD LOSS: 650 cc FLUID REPLACEMENT: 2000 cc crystalloid TRANEXAMIC ACID: 1gm IV, 2gm Topical COMPLICATIONS: none    INDICATIONS FOR PROCEDURE: A 76 y.o. year-old With  [place]   for 65 years, x-rays show bone-on-bone arthritic changes, and osteophytes. Despite conservative measures with observation, anti-inflammatory medicine, narcotics, use of a cane, has severe unremitting pain and can ambulate only a few blocks before resting. Patient desires elective R total hip arthroplasty to decrease pain and increase function. The risks, benefits, and alternatives were discussed at length including but not limited to the risks of infection, bleeding, nerve injury, stiffness, blood clots, the need for revision surgery, cardiopulmonary complications, among others, and they were willing to proceed. Questions answered      PROCEDURE IN DETAIL: The patient was identified by armband,   received preoperative IV antibiotics in the holding area at [facility], taken to the operating room , appropriate anesthetic monitors   were attached and anesthesia was induced with the patient on the gurney. HANA boots were applied to the  feet, and the patient  was transferred to the HANA table with a peroneal post and support underneath the non-operative leg. Theoperative lower extremity was then prepped and draped in the usual sterile fashion from just above the iliac crest to the knee. And a timeout procedure was performed. Kerry Hough. [facility] was present and scrubbed throughout the case, critical for assistance with, positioning, exposure, retraction, instrumentation, and closure.Skin along incision area was injected with 10 cc of Exparel solution. We then made a 13 cm incision along the interval at the leading edge of the tensor fascia lata of starting at 2 cm lateral to the ASIS. Small bleeders in the skin and subcutaneous tissue identified and cauterized we dissected down to the fascia and made an incision in the fascia allowing [place] to elevate the fascia of the tensor muscle and exploited the interval between the rectus and the tensor fascia lata. A Cobra retractor was then placed along the superior neck of the femur. A cerebellar retractor was used to expose the interval between the tensor fascia lata  and the rectus femoris.  We identified and cauterized the ascending branch of the anterior circumflex artery. A second Cobra retractor along the inferior neck of the femur. A small Hohmann retractor was placed underneath the origin of the rectus femoris, giving [place] good medial exposure. Using Ronguers fatty tissue was removed from in front of the anterior capsule. The capsule was then incised, starting out at the superior anterior rim of the acetabulum going laterally along the anterior neck. The capsule was then teed along the neck superiorly and inferiorly. Electrocautery was used to release capsule from the anterior and medial neck of the femur to allow external rotation.  Because of the patient's short neck from [place] these disease we elected to do a napkin ring osteotomy of the femoral neck x2 which created enough space for [place] to lever out  the mushroom-shaped femoral head from the flattened acetabulum.  We then placed a medium retractor in the cotyloid notch and narrow Hohmann spiked on the posterior rim of the acetabulum and the small Hohmann retractor on the anterior wall of the acetabulum.  Using a 43 mm basket reamer we then identified the center of the acetabulum and began our initial ream. We then sequentially reamed up to a 47 mm basket reamer obtaining good coverage in all quadrants, verified by C-arm imaging. Under C-arm control we then hammered into place a 48 mm GRIPTION sector cup in 45 of abduction and 15 of anteversion. The cup seated nicely and required no supplemental screws. We then placed a +45mm  0 polyethylene liner. The foot was then externally rotated to 100 degrees limited by the perceived scar tissue.  We performed standard releases of the femoral neck down to the level of the lesser trochanter and superiorly released the piriformis and part of the gluteus minimus from the styloid of the greater trochanter.  These releases rule out external rotation to 120 degrees.  The limb was extended and adducted to the floor, delivering the proximal femur up into the wound. A medium curved Hohmann retractor was placed over the greater trochanter and a long Homan retractor along the posterior femoral neck completing the exposure and lateralizing the femur.  With the long Hohmann in place and levered a portion of the greater styloid fractured off which actually enhance our exposure.  By palpation it only involves the proximal aspect of the styloid.  At that time I elected to place a 2 mm Zimmer cerclage cable tensioned to protect the metaphyseal region of the femur.  We then performed releases superiorly and and inferiorly of the capsule going back to the pirformis fossa superiorly and to the lesser trochanter inferiorly. We then entered the proximal femur with the box cutting offset chisel followed by, a canal sounder, the chili pepper  and broaching up to a 4 broach. This seated nicely and we reamed the calcar. A trial reduction was performed with a 1 mm X 32 mm head.the stability was noted with a high offset 4 trial.  The patient started out 2.5 cm short and with a 4 high offset stem and a +1 x 32 head he was now 6 mm short.  The hip was stable in full extension and 75 degrees of external rotation. At this point the trial components removed and we hammered into place a #for high offset Actis stem with Gryption coating. A + 1 mm x 32 head was then hammered into place. The hip was reduced and final C-arm images obtained. The wound was thoroughly  irrigated with normal saline solution. We repaired the ant capsule and the tensor fascia lot a with running 0 vicryl suture. the subcutaneous tissue was closed with 3-0 Vicryl suture followed by an Aquacil dressing. At this point the patient was awaken and transferred to hospital gurney without difficulty.   Kerin Salen 06/03/2021, 6:50 AM

## 2021-06-03 NOTE — Plan of Care (Signed)
°  Problem: Education: Goal: Knowledge of General Education information will improve Description: Including pain rating scale, medication(s)/side effects and non-pharmacologic comfort measures Outcome: Progressing   Problem: Activity: Goal: Risk for activity intolerance will decrease Outcome: Progressing   Problem: Nutrition: Goal: Adequate nutrition will be maintained Outcome: Progressing   Problem: Elimination: Goal: Will not experience complications related to bowel motility Outcome: Progressing   Problem: Pain Managment: Goal: General experience of comfort will improve Outcome: Progressing   Problem: Activity: Goal: Ability to avoid complications of mobility impairment will improve Outcome: Progressing   Problem: Pain Management: Goal: Pain level will decrease with appropriate interventions Outcome: Progressing

## 2021-06-03 NOTE — Anesthesia Procedure Notes (Signed)
Spinal  Patient location during procedure: OR Start time: 06/03/2021 7:26 AM End time: 06/03/2021 7:29 AM Reason for block: surgical anesthesia Staffing Performed: anesthesiologist  Anesthesiologist: Lyn Hollingshead, MD Preanesthetic Checklist Completed: patient identified, IV checked, site marked, risks and benefits discussed, surgical consent, monitors and equipment checked, pre-op evaluation and timeout performed Spinal Block Patient position: sitting Prep: DuraPrep and site prepped and draped Patient monitoring: continuous pulse ox and blood pressure Approach: midline Location: L3-4 Injection technique: single-shot Needle Needle type: Pencan  Needle gauge: 24 G Needle length: 10 cm Needle insertion depth: 5 cm Assessment Sensory level: T8 Events: CSF return

## 2021-06-03 NOTE — Interval H&P Note (Signed)
History and Physical Interval Note:  06/03/2021 6:49 AM  Logan [name]  has presented today for surgery, with the diagnosis of [place].  The various methods of treatment have been discussed with the patient and family. After consideration of risks, benefits and other options for treatment, the patient has consented to  Procedure(s): LEFT TOTAL HIP ARTHROPLASTY ANTERIOR APPROACH (Left) as a surgical intervention.  The patient's history has been reviewed, patient examined, no change in status, stable for surgery.  I have reviewed the patient's chart and labs.  Questions were answered to the patient's satisfaction.     Kerin Salen

## 2021-06-03 NOTE — Evaluation (Addendum)
Physical Therapy Evaluation Patient Details Name: Logan [name] MRN: [id number] DOB: [birth year] Today's Date: 06/03/2021  History of Present Illness  76 yo male s/p L DA THA on 06/03/21. PMH: HTN, ACDF C3-4, afib, HF, DM, obesity, sleep apnea, chronic pain  Clinical Impression  Pt is s/p THA resulting in the deficits listed below (see PT Problem List).  Pt is severely deconditioned with multiple impairments limiting his ability to mobilize, anticipate slow progress in acute setting. Will follow.    Pt will benefit from skilled PT to increase their independence and safety with mobility to allow discharge to the venue listed below.         Recommendations for follow up therapy are one component of a multi-disciplinary discharge planning process, led by the attending physician.  Recommendations may be updated based on patient status, additional functional criteria and insurance authorization.  Follow Up Recommendations Follow physician's recommendations for discharge plan and follow up therapies (needs SNF)    Assistance Recommended at Discharge Frequent or constant Supervision/Assistance  Patient can return home with the following  Assistance with cooking/housework;Assist for transportation;Help with stairs or ramp for entrance;Two people to help with walking and/or transfers;A lot of help with bathing/dressing/bathroom    Equipment Recommendations None recommended by PT (?w/c)  Recommendations for Other Services       Functional Status Assessment Patient has had a recent decline in their functional status and demonstrates the ability to make significant improvements in function in a reasonable and predictable amount of time.     Precautions / Restrictions Precautions Precautions: Fall Restrictions Weight Bearing Restrictions: No Other Position/Activity Restrictions: WBAT  anterior hip precautions ordered, appears to have had direct anterior approach--?? clarify    Mobility  Bed  Mobility Overal bed mobility: Needs Assistance Bed Mobility: Supine to Sit     Supine to sit: Mod assist, Max assist     General bed mobility comments: incr time, assist to elevate trunk and move bil LEs, scoot laterally    Transfers Overall transfer level: Needs assistance Equipment used: Rolling walker (2 wheels) Transfers: Sit to/from Stand, Bed to chair/wheelchair/BSC Sit to Stand: Min assist, Mod assist, From elevated surface          Lateral/Scoot Transfers: Min guard General transfer comment: assist with anterior-superior wt shift, incr time, unable to WB LLE d/t pain; scoots laterally from bed to chair with min/guard for safety and cues for technique    Ambulation/Gait               General Gait Details: unable  Stairs            Wheelchair Mobility    Modified Rankin (Stroke Patients Only)       Balance                                             Pertinent Vitals/Pain Pain Assessment Pain Assessment: 0-10 Pain Location: L hip Pain Descriptors / Indicators: Aching, Sore Pain Intervention(s): Limited activity within patient's tolerance, Monitored during session, Premedicated before session, Repositioned    Home Living Family/patient expects to be discharged to:: Private residence Living Arrangements: Children Available Help at Discharge: Family;Available PRN/intermittently Type of Home: Mobile home Home Access: Ramped entrance       Home Layout: One level Home Equipment: Rolling Walker (2 wheels)      Prior Function Prior Level of Function :  Independent/Modified Independent             Mobility Comments: amb with cane, recently walker d/t  incr hip pain--limited ambulator       Hand Dominance        Extremity/Trunk Assessment   Upper Extremity Assessment Upper Extremity Assessment: Defer to OT evaluation    Lower Extremity Assessment Lower Extremity Assessment: RLE deficits/detail RLE Deficits /  Details: unable to flex knee more than 50 degrees actively, strength 2+/5 LLE Deficits / Details: AAROM limited by pain, unable to test strength LLE: Unable to fully assess due to pain       Communication      Cognition Arousal/Alertness: Awake/alert Behavior During Therapy: WFL for tasks assessed/performed Overall Cognitive Status: Within Functional Limits for tasks assessed                                          General Comments      Exercises Total Joint Exercises Ankle Circles/Pumps: AROM, Both, 10 reps   Assessment/Plan    PT Assessment Patient needs continued PT services  PT Problem List Decreased strength;Decreased mobility;Decreased range of motion;Decreased activity tolerance;Decreased balance;Decreased knowledge of use of DME;Pain       PT Treatment Interventions DME instruction;Therapeutic exercise;Gait training;Functional mobility training;Therapeutic activities;Patient/family education    PT Goals (Current goals can be found in the Care Plan section)  Acute Rehab PT Goals Patient Stated Goal: get stronger PT Goal Formulation: With patient Time For Goal Achievement: 06/17/21 Potential to Achieve Goals: Fair    Frequency 7X/week     Co-evaluation               AM-PAC PT "6 Clicks" Mobility  Outcome Measure Help needed turning from your back to your side while in a flat bed without using bedrails?: A Lot Help needed moving from lying on your back to sitting on the side of a flat bed without using bedrails?: A Lot Help needed moving to and from a bed to a chair (including a wheelchair)?: A Lot Help needed standing up from a chair using your arms (e.g., wheelchair or bedside chair)?: A Lot Help needed to walk in hospital room?: Total Help needed climbing 3-5 steps with a railing? : Total 6 Click Score: 10    End of Session Equipment Utilized During Treatment: Gait belt Activity Tolerance: Patient limited by pain Patient left: in  chair;with call bell/phone within reach;with chair alarm set   PT Visit Diagnosis: Other abnormalities of gait and mobility (R26.89);Muscle weakness (generalized) (M62.81);Difficulty in walking, not elsewhere classified (R26.2);History of falling (Z91.81)    Time: 5701-7793 PT Time Calculation (min) (ACUTE ONLY): 32 min   Charges:   PT Evaluation $PT Eval Low Complexity: 1 Low PT Treatments $Therapeutic Activity: 8-22 mins        Baxter Flattery, PT  Acute Rehab Dept ([facility]) [phone] Pager [phone]  06/03/2021   [facility] 06/03/2021, 3:10 PM

## 2021-06-03 NOTE — Discharge Instructions (Addendum)
INSTRUCTIONS AFTER JOINT REPLACEMENT  ° °Remove items at home which could result in a fall. This includes throw rugs or furniture in walking pathways °ICE to the affected joint every three hours while awake for 30 minutes at a time, for at least the first 3-5 days, and then as needed for pain and swelling.  Continue to use ice for pain and swelling. You may notice swelling that will progress down to the foot and ankle.  This is normal after surgery.  Elevate your leg when you are not up walking on it.   °Continue to use the breathing machine you got in the hospital (incentive spirometer) which will help keep your temperature down.  It is common for your temperature to cycle up and down following surgery, especially at night when you are not up moving around and exerting yourself.  The breathing machine keeps your lungs expanded and your temperature down. ° ° °DIET:  As you were doing prior to hospitalization, we recommend a well-balanced diet. ° °DRESSING / WOUND CARE / SHOWERING ° °Keep the surgical dressing until follow up.  The dressing is water proof, so you can shower without any extra covering.  IF THE DRESSING FALLS OFF or the wound gets wet inside, change the dressing with sterile gauze.  Please use good hand washing techniques before changing the dressing.  Do not use any lotions or creams on the incision until instructed by your surgeon.   ° °ACTIVITY ° °Increase activity slowly as tolerated, but follow the weight bearing instructions below.   °No driving for 6 weeks or until further direction given by your physician.  You cannot drive while taking narcotics.  °No lifting or carrying greater than 10 lbs. until further directed by your surgeon. °Avoid periods of inactivity such as sitting longer than an hour when not asleep. This helps prevent blood clots.  °You may return to work once you are authorized by your doctor.  ° ° ° °WEIGHT BEARING  ° °Weight bearing as tolerated with assist device (walker, cane,  etc) as directed, use it as long as suggested by your surgeon or therapist, typically at least 4-6 weeks. ° ° °EXERCISES ° °Results after joint replacement surgery are often greatly improved when you follow the exercise, range of motion and muscle strengthening exercises prescribed by your doctor. Safety measures are also important to protect the joint from further injury. Any time any of these exercises cause you to have increased pain or swelling, decrease what you are doing until you are comfortable again and then slowly increase them. If you have problems or questions, call your caregiver or physical therapist for advice.  ° °Rehabilitation is important following a joint replacement. After just a few days of immobilization, the muscles of the leg can become weakened and shrink (atrophy).  These exercises are designed to build up the tone and strength of the thigh and leg muscles and to improve motion. Often times heat used for twenty to thirty minutes before working out will loosen up your tissues and help with improving the range of motion but do not use heat for the first two weeks following surgery (sometimes heat can increase post-operative swelling).  ° °These exercises can be done on a training (exercise) mat, on the floor, on a table or on a bed. Use whatever works the best and is most comfortable for you.    Use music or television while you are exercising so that the exercises are a pleasant break in your   day. This will make your life better with the exercises acting as a break in your routine that you can look forward to.   Perform all exercises about fifteen times, three times per day or as directed.  You should exercise both the operative leg and the other leg as well.  Exercises include:   Quad Sets - Tighten up the muscle on the front of the thigh (Quad) and hold for 5-10 seconds.   Straight Leg Raises - With your knee straight (if you were given a brace, keep it on), lift the leg to 60  degrees, hold for 3 seconds, and slowly lower the leg.  Perform this exercise against resistance later as your leg gets stronger.  Leg Slides: Lying on your back, slowly slide your foot toward your buttocks, bending your knee up off the floor (only go as far as is comfortable). Then slowly slide your foot back down until your leg is flat on the floor again.  Angel Wings: Lying on your back spread your legs to the side as far apart as you can without causing discomfort.  Hamstring Strength:  Lying on your back, push your heel against the floor with your leg straight by tightening up the muscles of your buttocks.  Repeat, but this time bend your knee to a comfortable angle, and push your heel against the floor.  You may put a pillow under the heel to make it more comfortable if necessary.   A rehabilitation program following joint replacement surgery can speed recovery and prevent re-injury in the future due to weakened muscles. Contact your doctor or a physical therapist for more information on knee rehabilitation.    CONSTIPATION  Constipation is defined medically as fewer than three stools per week and severe constipation as less than one stool per week.  Even if you have a regular bowel pattern at home, your normal regimen is likely to be disrupted due to multiple reasons following surgery.  Combination of anesthesia, postoperative narcotics, change in appetite and fluid intake all can affect your bowels.   YOU MUST use at least one of the following options; they are listed in order of increasing strength to get the job done.  They are all available over the counter, and you may need to use some, POSSIBLY even all of these options:    Drink plenty of fluids (prune juice may be helpful) and high fiber foods Colace 100 mg by mouth twice a day  Senokot for constipation as directed and as needed Dulcolax (bisacodyl), take with full glass of water  Miralax (polyethylene glycol) once or twice a day as  needed.  If you have tried all these things and are unable to have a bowel movement in the first 3-4 days after surgery call either your surgeon or your primary doctor.    If you experience loose stools or diarrhea, hold the medications until you stool forms back up.  If your symptoms do not get better within 1 week or if they get worse, check with your doctor.  If you experience "the worst abdominal pain ever" or develop nausea or vomiting, please contact the office immediately for further recommendations for treatment.   ITCHING:  If you experience itching with your medications, try taking only a single pain pill, or even half a pain pill at a time.  You can also use Benadryl over the counter for itching or also to help with sleep.   TED HOSE STOCKINGS:  Use stockings on both  legs until for at least 2 weeks or as directed by physician office. They may be removed at night for sleeping.  MEDICATIONS:  See your medication summary on the After Visit Summary that nursing will review with you.  You may have some home medications which will be placed on hold until you complete the course of blood thinner medication.  It is important for you to complete the blood thinner medication as prescribed.  PRECAUTIONS:  If you experience chest pain or shortness of breath - call 911 immediately for transfer to the hospital emergency department.   If you develop a fever greater that 101 F, purulent drainage from wound, increased redness or drainage from wound, foul odor from the wound/dressing, or calf pain - CONTACT YOUR SURGEON.                                                   FOLLOW-UP APPOINTMENTS:  If you do not already have a post-op appointment, please call the office for an appointment to be seen by your surgeon.  Guidelines for how soon to be seen are listed in your After Visit Summary, but are typically between 1-4 weeks after surgery.  OTHER INSTRUCTIONS:   Knee Replacement:  Do not place pillow  under knee, focus on keeping the knee straight while resting. CPM instructions: 0-90 degrees, 2 hours in the morning, 2 hours in the afternoon, and 2 hours in the evening. Place foam block, curve side up under heel at all times except when in CPM or when walking.  DO NOT modify, tear, cut, or change the foam block in any way.  POST-OPERATIVE OPIOID TAPER INSTRUCTIONS: It is important to wean off of your opioid medication as soon as possible. If you do not need pain medication after your surgery it is ok to stop day one. Opioids include: Codeine, Hydrocodone(Norco, Vicodin), Oxycodone(Percocet, oxycontin) and hydromorphone amongst others.  Long term and even short term use of opiods can cause: Increased pain response Dependence Constipation Depression Respiratory depression And more.  Withdrawal symptoms can include Flu like symptoms Nausea, vomiting And more Techniques to manage these symptoms Hydrate well Eat regular healthy meals Stay active Use relaxation techniques(deep breathing, meditating, yoga) Do Not substitute Alcohol to help with tapering If you have been on opioids for less than two weeks and do not have pain than it is ok to stop all together.  Plan to wean off of opioids This plan should start within one week post op of your joint replacement. Maintain the same interval or time between taking each dose and first decrease the dose.  Cut the total daily intake of opioids by one tablet each day Next start to increase the time between doses. The last dose that should be eliminated is the evening dose.   MAKE SURE YOU:  Understand these instructions.  Get help right away if you are not doing well or get worse.    Thank you for letting us be a part of your medical care team.  It is a privilege we respect greatly.  We hope these instructions will help you stay on track for a fast and full recovery!   Information on my medicine - ELIQUIS (apixaban)  This medication  education was reviewed with me or my healthcare representative as part of my discharge preparation.  The pharmacist that spoke  with me during my hospital stay was:    Why was Eliquis prescribed for you? Eliquis was prescribed for you to reduce the risk of blood clots forming after orthopedic surgery.    What do You need to know about Eliquis? Take your Eliquis TWICE DAILY - one tablet in the morning and one tablet in the evening with or without food.  It would be best to take the dose about the same time each day.  If you have difficulty swallowing the tablet whole please discuss with your pharmacist how to take the medication safely.  Take Eliquis exactly as prescribed by your doctor and DO NOT stop taking Eliquis without talking to the doctor who prescribed the medication.  Stopping without other medication to take the place of Eliquis may increase your risk of developing a clot.  After discharge, you should have regular check-up appointments with your healthcare provider that is prescribing your Eliquis.  What do you do if you miss a dose? If a dose of ELIQUIS is not taken at the scheduled time, take it as soon as possible on the same day and twice-daily administration should be resumed.  The dose should not be doubled to make up for a missed dose.  Do not take more than one tablet of ELIQUIS at the same time.  Important Safety Information A possible side effect of Eliquis is bleeding. You should call your healthcare provider right away if you experience any of the following: Bleeding from an injury or your nose that does not stop. Unusual colored urine (red or dark brown) or unusual colored stools (red or black). Unusual bruising for unknown reasons. A serious fall or if you hit your head (even if there is no bleeding).  Some medicines may interact with Eliquis and might increase your risk of bleeding or clotting while on Eliquis. To help avoid this, consult your healthcare  provider or pharmacist prior to using any new prescription or non-prescription medications, including herbals, vitamins, non-steroidal anti-inflammatory drugs (NSAIDs) and supplements.  This website has more information on Eliquis (apixaban): http://[email or web address].com/eliquis/home

## 2021-06-04 ENCOUNTER — Encounter (HOSPITAL_COMMUNITY): Payer: Self-pay | Admitting: Orthopedic Surgery

## 2021-06-04 DIAGNOSIS — Z86718 Personal history of other venous thrombosis and embolism: Secondary | ICD-10-CM | POA: Diagnosis not present

## 2021-06-04 DIAGNOSIS — I5032 Chronic diastolic (congestive) heart failure: Secondary | ICD-10-CM | POA: Diagnosis not present

## 2021-06-04 DIAGNOSIS — E119 Type 2 diabetes mellitus without complications: Secondary | ICD-10-CM | POA: Diagnosis not present

## 2021-06-04 DIAGNOSIS — M1612 Unilateral primary osteoarthritis, left hip: Secondary | ICD-10-CM | POA: Diagnosis not present

## 2021-06-04 DIAGNOSIS — I110 Hypertensive heart disease with heart failure: Secondary | ICD-10-CM | POA: Diagnosis not present

## 2021-06-04 DIAGNOSIS — Z20822 Contact with and (suspected) exposure to covid-19: Secondary | ICD-10-CM | POA: Diagnosis not present

## 2021-06-04 DIAGNOSIS — Z79899 Other long term (current) drug therapy: Secondary | ICD-10-CM | POA: Diagnosis not present

## 2021-06-04 DIAGNOSIS — M9112 Juvenile osteochondrosis of head of femur [Legg-Calve-Perthes], left leg: Secondary | ICD-10-CM | POA: Diagnosis not present

## 2021-06-04 DIAGNOSIS — Z7901 Long term (current) use of anticoagulants: Secondary | ICD-10-CM | POA: Diagnosis not present

## 2021-06-04 LAB — BASIC METABOLIC PANEL
Anion gap: 4 — ABNORMAL LOW (ref 5–15)
BUN: 10 mg/dL (ref 8–23)
CO2: 32 mmol/L (ref 22–32)
Calcium: 8.5 mg/dL — ABNORMAL LOW (ref 8.9–10.3)
Chloride: 100 mmol/L (ref 98–111)
Creatinine, Ser: 0.67 mg/dL (ref 0.61–1.24)
GFR, Estimated: >60 mL/min (ref >60)
Glucose, Bld: 122 mg/dL — ABNORMAL HIGH (ref 70–99)
Potassium: 3.3 mmol/L — ABNORMAL LOW (ref 3.5–5.1)
Sodium: 136 mmol/L (ref 135–145)

## 2021-06-04 LAB — CBC
HCT: 28.7 % — ABNORMAL LOW (ref 39.0–52.0)
Hemoglobin: 9.2 g/dL — ABNORMAL LOW (ref 13.0–17.0)
MCH: 33 pg (ref 26.0–34.0)
MCHC: 32.1 g/dL (ref 30.0–36.0)
MCV: 102.9 fL — ABNORMAL HIGH (ref 80.0–100.0)
Platelets: 223 10*3/uL (ref 150–400)
RBC: 2.79 MIL/uL — ABNORMAL LOW (ref 4.22–5.81)
RDW: 15 % (ref 11.5–15.5)
WBC: 12 10*3/uL — ABNORMAL HIGH (ref 4.0–10.5)
nRBC: 0 % (ref 0.0–0.2)

## 2021-06-04 NOTE — Evaluation (Signed)
Occupational Therapy Evaluation Patient Details Name: Logan [name] MRN: [id number] DOB: 1945/07/07 Today's Date: 06/04/2021   History of Present Illness 76 yo male s/p L THA on 06/03/21. PMH: HTN, ACDF C3-4, afib, HF, DM, obesity, sleep apnea and chronic pain.   Clinical Impression   PTA patient was living alone in a mobile home with ramped entry and was requiring some assist from his sons with bathing at shower level. Patient also reports needs for assist with IADLs at baseline including meal prep and transportation. Patient is a limited ambulator in home distances with use of SPC vs RW (~85f from room to room). Patient currently functioning below baseline demonstrating observed ADLs with Mod to up to +2 assist. Patient also limited by deficits listed below including pain, generalized weakness and difficulty/inability to bear weight on LLE and would benefit from continued acute OT services in prep for safe d/c to next level of care. Patient is highly motivated. OT will continue to follow acutely.        Recommendations for follow up therapy are one component of a multi-disciplinary discharge planning process, led by the attending physician.  Recommendations may be updated based on patient status, additional functional criteria and insurance authorization.   Follow Up Recommendations  Follow physician's recommendations for discharge plan and follow up therapies (Needs SNF rehab)    Assistance Recommended at Discharge Frequent or constant Supervision/Assistance  Patient can return home with the following A lot of help with walking and/or transfers;A lot of help with bathing/dressing/bathroom;Assistance with cooking/housework;Assist for transportation    Functional Status Assessment  Patient has had a recent decline in their functional status and demonstrates the ability to make significant improvements in function in a reasonable and predictable amount of time.  Equipment Recommendations  None  recommended by OT (Patient is well equipped)    Recommendations for Other Services       Precautions / Restrictions Precautions Precautions: Fall Precaution Comments: Direct anterior approach; no preacutions Restrictions Weight Bearing Restrictions: No Other Position/Activity Restrictions: WBAT      Mobility Bed Mobility Overal bed mobility: Needs Assistance Bed Mobility: Supine to Sit     Supine to sit: Mod assist, Max assist     General bed mobility comments: Assist at BLE and trunk. Increased time/effort. limited by pain in R shoulder and L hip.    Transfers Overall transfer level: Needs assistance Equipment used: Rolling walker (2 wheels) Transfers: Sit to/from Stand, Bed to chair/wheelchair/BSC Sit to Stand: Mod assist, From elevated surface, Max assist          Lateral/Scoot Transfers: Mod assist General transfer comment: Max A for sit to stand from elevated EOB to RW with cues for hand palcement. Difficulty weightbearing through LLE. Limited by increased pain this a.m. Min to Mod A for lateral scoot to recliner and to prevent posterior LOB.      Balance Overall balance assessment: Needs assistance Sitting-balance support: Bilateral upper extremity supported, Feet supported Sitting balance-Leahy Scale: Poor Sitting balance - Comments: BUE support on bed surface to maintain static sitting balance. Min-Mod A to maintain dynamic sitting balance.   Standing balance support: Bilateral upper extremity supported, During functional activity, Reliant on assistive device for balance Standing balance-Leahy Scale: Zero Standing balance comment: Unable to stand fully upright 2/2 inability to bear weight on LLE.                           ADL either performed or assessed  with clinical judgement   ADL Overall ADL's : Needs assistance/impaired     Grooming: Set up;Sitting   Upper Body Bathing: Minimal assistance;Sitting   Lower Body Bathing: Maximal  assistance;Sit to/from stand   Upper Body Dressing : Minimal assistance;Sitting   Lower Body Dressing: Maximal assistance;Sitting/lateral leans;Sit to/from stand   Toilet Transfer: Maximal assistance Toilet Transfer Details (indicate cue type and reason): Simualted with lateral scoot transfer to recliner on R.                 Vision   Vision Assessment?: No apparent visual deficits     Perception     Praxis      Pertinent Vitals/Pain Pain Assessment Pain Assessment: 0-10 Pain Score: 7  Pain Location: L hip (surgical) and R shoulder (chronic) Pain Descriptors / Indicators: Aching, Sore Pain Intervention(s): Limited activity within patient's tolerance, Monitored during session, Premedicated before session, Repositioned     Hand Dominance Right   Extremity/Trunk Assessment Upper Extremity Assessment Upper Extremity Assessment: RUE deficits/detail;LUE deficits/detail RUE Deficits / Details: Limited AROM at shoulder (chronic); AROM WFL at elbow/wrist/digits. Generalized weakness. RUE Sensation: WNL RUE Coordination: decreased gross motor   Lower Extremity Assessment Lower Extremity Assessment: Defer to PT evaluation   Cervical / Trunk Assessment Cervical / Trunk Assessment: Kyphotic   Communication Communication Communication: Other (comment) (Difficult to undestand)   Cognition Arousal/Alertness: Awake/alert Behavior During Therapy: WFL for tasks assessed/performed Overall Cognitive Status: Within Functional Limits for tasks assessed                                       General Comments  SpO2 97% on 3L. RN titrated to RA. SpO2 90-91% directly after activity and after 5 min of rest.    Exercises     Shoulder Instructions      Home Living Family/patient expects to be discharged to:: Private residence Living Arrangements: Alone Available Help at Discharge: Family;Available PRN/intermittently Type of Home: Mobile home Home Access: Ramped  entrance     Home Layout: One level     Bathroom Shower/Tub: Teacher, early years/pre: Standard     Home Equipment: Conservation officer, nature (2 wheels);Wheelchair - manual;Grab bars - tub/shower;BSC/3in1;Tub bench;Other (comment) (Hip kit)          Prior Functioning/Environment Prior Level of Function : Needs assist       Physical Assist : Mobility (physical);ADLs (physical) Mobility (physical): Gait ADLs (physical): Bathing Mobility Comments: Household mobility ~40f Max with SPC vs RW. ADLs Comments: Son assist with tub/shower transfers and bathing. Patient able to dress himself with use of AE/DME. Assist to don footwear. Son assists with IADLs including meal prep and transportation.        OT Problem List: Decreased strength;Decreased range of motion;Decreased activity tolerance;Impaired balance (sitting and/or standing);Decreased coordination;Decreased knowledge of use of DME or AE;Cardiopulmonary status limiting activity;Obesity;Impaired UE functional use;Pain;Increased edema      OT Treatment/Interventions: Self-care/ADL training;Therapeutic exercise;Energy conservation;DME and/or AE instruction;Therapeutic activities;Patient/family education;Balance training    OT Goals(Current goals can be found in the care plan section) Acute Rehab OT Goals Patient Stated Goal: To go to rehab. OT Goal Formulation: With patient Time For Goal Achievement: 06/18/21 Potential to Achieve Goals: Good ADL Goals Additional ADL Goal #1: Patient will complete bed mobiltiy with Min A in prep for ADLs and functional transfers. Additional ADL Goal #2: Patient will complete sit to stand transfers with  Min A and LRAD in prep for ADLs and ADL transfers. Additional ADL Goal #3: Patient will maintain static sitting balance with unilateral UE support and close supervision A in prep for ADLs.  OT Frequency: Min 2X/week    Co-evaluation              AM-PAC OT "6 Clicks" Daily Activity      Outcome Measure Help from another person eating meals?: A Little Help from another person taking care of personal grooming?: A Little Help from another person toileting, which includes using toliet, bedpan, or urinal?: A Lot Help from another person bathing (including washing, rinsing, drying)?: A Lot Help from another person to put on and taking off regular upper body clothing?: A Lot Help from another person to put on and taking off regular lower body clothing?: A Lot 6 Click Score: 14   End of Session Equipment Utilized During Treatment: Gait belt;Rolling walker (2 wheels) Nurse Communication: Mobility status;Other (comment) (+2 assist for transfer back to bed.)  Activity Tolerance: Patient tolerated treatment well;No increased pain Patient left: in chair;with call bell/phone within reach;with chair alarm set  OT Visit Diagnosis: Unsteadiness on feet (R26.81);Other abnormalities of gait and mobility (R26.89);Muscle weakness (generalized) (M62.81);History of falling (Z91.81);Pain Pain - part of body:  (R shoulder and L hip)                Time: 2458-0998 OT Time Calculation (min): 26 min Charges:  OT General Charges $OT Visit: 1 Visit OT Evaluation $OT Eval Moderate Complexity: 1 Mod OT Treatments $Therapeutic Activity: 8-22 mins  Gisella Alwine H. OTR/L Supplemental OT, Department of rehab services [phone]  Beckett Hickmon R H. 06/04/2021, 7:58 AM

## 2021-06-04 NOTE — Progress Notes (Signed)
Physical Therapy Treatment Patient Details Name: Logan [name] MRN: [id number] DOB: 12/06/1945 Today's Date: 06/04/2021   History of Present Illness 76 yo male s/p L THA on 06/03/21. PMH: HTN, ACDF C3-4, afib, HF, DM, obesity, sleep apnea and chronic pain.    PT Comments    The patient is resting in recliner. The patient requires 2 person max assist to rise from chair, assist to place hands on Rw. Patient's right leg is limited in knee flexion and  is not aligned, Appears varus deformity, placing increased weight on LLE.Stood x 3 with Rw. Stood x 1 at  bilateral RW/platform, tolerated 1-2 minutes. \Patient will require extensive therapies to return to functional ambulation. Recommend SNF.   Recommendations for follow up therapy are one component of a multi-disciplinary discharge planning process, led by the attending physician.  Recommendations may be updated based on patient status, additional functional criteria and insurance authorization.  Follow Up Recommendations  Follow physician's recommendations for discharge plan and follow up therapies recommend SNF     Assistance Recommended at Discharge Frequent or constant Supervision/Assistance  Patient can return home with the following Assistance with cooking/housework;Assist for transportation;Help with stairs or ramp for entrance;Two people to help with walking and/or transfers;A lot of help with bathing/dressing/bathroom   Equipment Recommendations  Wheelchair (measurements PT);Wheelchair cushion (measurements PT) (if DC home with support)    Recommendations for Other Services       Precautions / Restrictions Precautions Precautions: Fall Precaution Comments: Rt knee DJD , decreased knee flexion on Rt. to stand up, also varus deformity on right. Restrictions Weight Bearing Restrictions: No     Mobility  Bed Mobility               General bed mobility comments: in recliner    Transfers Overall transfer level: Needs  assistance Equipment used: Rolling walker (2 wheels), Bilateral platform walker Transfers: Sit to/from Stand Sit to Stand: Max assist, +2 physical assistance, +2 safety/equipment           General transfer comment: + 2 max/total  to stand from recliner. PT has to assist with RIGHT knee flexion and then  approximate through the knee to maintain some WB on the  leg to stand up. Requires max lifting assistance of +  2 to power up and then reach for RW grips. Patient only tolerates x ~ 30 secs, performed x 3.Then placed EVA bilateral RW/shelf infront. 2  max to stand and reach for hand grips. Able to stand for 1-2 minutes with increased WB on Right leg.  and decreased WB and pain on the left hip.    Ambulation/Gait ubablw.                   Stairs             Wheelchair Mobility    Modified Rankin (Stroke Patients Only)       Balance   Sitting-balance support: Bilateral upper extremity supported, Feet supported Sitting balance-Leahy Scale: Poor Sitting balance - Comments: difficulty leaning forward in recliner and keeping balance.  requires UE support   Standing balance support: Bilateral upper extremity supported, During functional activity, Reliant on assistive device for balance Standing balance-Leahy Scale: Zero Standing balance comment: Reliant on bileral UE and support of therapist to prevent posterior balance loss.                            Cognition Arousal/Alertness: Awake/alert Behavior During  Therapy: WFL for tasks assessed/performed Overall Cognitive Status: Within Functional Limits for tasks assessed                                          Exercises General Exercises - Lower Extremity Long Arc Quad: AAROM, AROM, Both, 10 reps, Seated    General Comments        Pertinent Vitals/Pain Pain Assessment Pain Score: 5  Pain Location: L hip and rt shoulder Pain Descriptors / Indicators: Discomfort, Aching,  Grimacing Pain Intervention(s): Monitored during session, Patient requesting pain meds-RN notified, Ice applied, Repositioned    Home Living                          Prior Function            PT Goals (current goals can now be found in the care plan section) Progress towards PT goals: Progressing toward goals    Frequency    7X/week      PT Plan Current plan remains appropriate    Co-evaluation              AM-PAC PT "6 Clicks" Mobility   Outcome Measure  Help needed turning from your back to your side while in a flat bed without using bedrails?: A Lot Help needed moving from lying on your back to sitting on the side of a flat bed without using bedrails?: A Lot Help needed moving to and from a bed to a chair (including a wheelchair)?: A Lot Help needed standing up from a chair using your arms (e.g., wheelchair or bedside chair)?: Total Help needed to walk in hospital room?: Total Help needed climbing 3-5 steps with a railing? : Total 6 Click Score: 9    End of Session Equipment Utilized During Treatment: Gait belt Activity Tolerance: Patient tolerated treatment well Patient left: in chair;with call bell/phone within reach;with chair alarm set Nurse Communication: Mobility status PT Visit Diagnosis: Other abnormalities of gait and mobility (R26.89);Muscle weakness (generalized) (M62.81);Difficulty in walking, not elsewhere classified (R26.2);History of falling (Z91.81)     Time: 6945-0388 PT Time Calculation (min) (ACUTE ONLY): 39 min  Charges:  $Therapeutic Exercise: 8-22 mins $Therapeutic Activity: 23-37 mins                     Logan [name] PT Acute Rehabilitation Services Pager [phone] Office [phone]    Logan [name] 06/04/2021, 11:03 AM

## 2021-06-04 NOTE — Progress Notes (Signed)
PATIENT ID: Keenen Roessner  MRN: [id number]  DOB/AGE:  [birth year] / 76 y.o.  1 Day Post-Op Procedure(s) (LRB): LEFT TOTAL HIP ARTHROPLASTY ANTERIOR APPROACH (Left)    PROGRESS NOTE Subjective: Patient is alert, o riented, no Nausea, no Vomiting, yes passing gas, . Taking PO well. Denies SOB, Chest or Calf Pain. Using Incentive Spirometer, PAS in place. Ambulate WBAT Patient reports pain as  4/10  .    Objective: Vital signs in last 24 hours: Vitals:   06/03/21 1411 06/03/21 2103 06/04/21 0046 06/04/21 0512  BP: 127/64 109/68 129/66 (!) 133/92  Pulse: 78 78 76 88  Resp: 18 17 18 19   Temp: 97.8 F (36.6 C) 98.4 F (36.9 C) 97.7 F (36.5 C) 98.2 F (36.8 C)  TempSrc: Oral Oral Oral Oral  SpO2: 99% 97% 100% 99%  Weight:      Height:          Intake/Output from previous day: I/O last 3 completed shifts: In: 5364.4 [P.O.:1080; I.V.:3784.4; IV Piggyback:500] Out: 1660 [Urine:1060; Blood:600]   Intake/Output this shift: No intake/output data recorded.   LABORATORY DATA: Recent Labs    06/03/21 0523 06/04/21 0328  WBC  --  12.0*  HGB  --  9.2*  HCT  --  28.7*  PLT  --  223  NA  --  136  K  --  3.3*  CL  --  100  CO2  --  32  BUN  --  10  CREATININE  --  0.67  GLUCOSE  --  122*  GLUCAP 102*  --   CALCIUM  --  8.5*    Examination: Neurologically intact ABD soft Neurovascular intact Sensation intact distally Intact pulses distally Dorsiflexion/Plantar flexion intact Incision: no drainage No cellulitis present Compartment soft} XR AP&Lat of hip shows well placed\fixed THA  Assessment:   1 Day Post-Op Procedure(s) (LRB): LEFT TOTAL HIP ARTHROPLASTY ANTERIOR APPROACH (Left) ADDITIONAL DIAGNOSIS:  Expected Acute Blood Loss Anemia, Sleep apnea, atrial fibrillation, morbid obesity,Gout, hypertension, cervical myelopathy, CHF  Patient's anticipated LOS is less than 2 midnights, meeting these requirements: - Younger than 21 - Lives within 1 hour of care - Has a  competent adult at home to recover with post-op recover - NO history of  - Chronic pain requiring opiods  - Diabetes  - Coronary Artery Disease  - Heart failure  - Heart attack  - Stroke  - DVT/VTE  - Cardiac arrhythmia  - Respiratory Failure/COPD  - Renal failure  - Anemia  - Advanced Liver disease     Plan: PT/OT WBAT, THA  DVT Prophylaxis: SCDx72 hrs, Eliquis 2.5 mg bid  DISCHARGE PLAN: Home, when passes PT.  DISCHARGE NEEDS: HHPT, Walker, and 3-in-1 comode seat Patient ID: [name], male   DOB: Sep 01, 1945, 76 y.o.   MRN: [id number]

## 2021-06-04 NOTE — NC FL2 (Signed)
[place] LEVEL OF CARE SCREENING TOOL     IDENTIFICATION  Patient Name: Logan [name] Birthdate: 12-Mar-1946 Sex: male Admission Date (Current Location): 06/03/2021  [facility] and [place] Number:  Herbalist and Address:  [facility],  [address] [place], [place]      Provider Number: [id number]  Attending Physician Name and Address:  Frederik Pear, MD  Relative Name and Phone Number:  son, [place], [place]. @ [phone]    Current Level of Care: Hospital Recommended Level of Care: [place] Prior Approval Number:    Date Approved/Denied:   PASRR Number: [id number]  Discharge Plan: SNF    Current Diagnoses: Patient Active Problem List   Diagnosis Date Noted   H/O total hip arthroplasty, left 06/03/2021   Osteoarthritis 03/07/2021   Chronic incomplete quadriplegia ([place]) 03/04/2021   Abnormal gait 03/04/2021   Chronic pain syndrome 03/04/2021   Insomnia due to medical condition 03/04/2021   Shoulder subluxation, right 01/31/2021   Hypoalbuminemia due to protein-calorie malnutrition (HCC)    Hypokalemia    Prediabetes    Cervical disc disease with myelopathy 01/03/2021   Cervical myelopathy ([place]) 01/01/2021   Pneumonia 11/05/2020   Cervical radiculopathy 11/05/2020   Acute on chronic heart failure ([place]) 11/05/2020   Seasonal allergies    Orthopnea    Morbid obesity ([place])    DVT (deep venous thrombosis) ([place])    Bilateral lower extremity edema    Secondary hypercoagulable state ([place]) 03/28/2020   Persistent atrial fibrillation ([place]) 10/21/2019   Essential hypertension 10/21/2019   Chronic diastolic heart failure ([place]) 10/21/2019   Mixed hyperlipidemia 10/21/2019   Obesity (BMI 30-39.9) 08/24/2019   Sleep apnea    Primary osteoarthritis of left hip    Hypertension    Hyperlipidemia    Gout    Esophageal reflux    Diabetes mellitus without complication (HCC)    Atrial fibrillation (HCC)    Anxiety     Atherosclerotic renal artery stenosis, unilateral (HCC) 02/28/2014    Orientation RESPIRATION BLADDER Height & Weight     Self, Time, Situation, Place  Normal Continent Weight: 173 lb 1 oz (78.5 kg) Height:  5\' 7"  (170.2 cm)  BEHAVIORAL SYMPTOMS/MOOD NEUROLOGICAL BOWEL NUTRITION STATUS      Continent    AMBULATORY STATUS COMMUNICATION OF NEEDS Skin   Extensive Assist Verbally Other (Comment) (surgical incision only)                       Personal Care Assistance Level of Assistance  Bathing, Dressing Bathing Assistance: Limited assistance   Dressing Assistance: Limited assistance     Functional Limitations Info             [place]  PT (By licensed PT), OT (By licensed OT)     PT Frequency: 5x/wk OT Frequency: 5x/wk            Contractures Contractures Info: Not present    Additional Factors Info  Code Status, Allergies Code Status Info: Full Allergies Info: NKDA           Current Medications (06/04/2021):  This is the current hospital active medication list Current Facility-Administered Medications  Medication Dose Route Frequency Provider Last Rate Last Admin   acetaminophen (TYLENOL) tablet 325-650 mg  325-650 mg Oral Q6H PRN Leighton Parody, PA-C       allopurinol (ZYLOPRIM) tablet 300 mg  300 mg Oral Daily Leighton Parody, PA-C  300 mg at 06/04/21 1024   apixaban (ELIQUIS) tablet 2.5 mg  2.5 mg Oral Q12H Leighton Parody, PA-C   2.5 mg at 06/04/21 1025   bisacodyl (DULCOLAX) EC tablet 5 mg  5 mg Oral Daily PRN Leighton Parody, PA-C       carvedilol (COREG) tablet 12.5 mg  12.5 mg Oral BID WC Joanell Rising K, PA-C   12.5 mg at 06/04/21 1024   dextrose 5 % and 0.45 % NaCl with KCl 20 mEq/L infusion   Intravenous Continuous Leighton Parody, PA-C   Stopped at 06/04/21 1029   diphenhydrAMINE (BENADRYL) 12.5 MG/5ML elixir 12.5-25 mg  12.5-25 mg Oral Q4H PRN Leighton Parody, PA-C       docusate sodium (COLACE) capsule 100 mg   100 mg Oral BID Joanell Rising K, PA-C   100 mg at 06/04/21 1025   fluticasone (FLONASE) 50 MCG/ACT nasal spray 1 spray  1 spray Each Nare Daily PRN Leighton Parody, PA-C       furosemide (LASIX) tablet 40 mg  40 mg Oral Daily Leighton Parody, PA-C   40 mg at 06/04/21 1025   HYDROmorphone (DILAUDID) injection 0.5-1 mg  0.5-1 mg Intravenous Q4H PRN Leighton Parody, PA-C   1 mg at 06/04/21 0523   loratadine (CLARITIN) tablet 10 mg  10 mg Oral Daily PRN Leighton Parody, PA-C   10 mg at 06/03/21 2124   losartan (COZAAR) tablet 50 mg  50 mg Oral Daily Leighton Parody, PA-C   50 mg at 06/04/21 1025   menthol-cetylpyridinium (CEPACOL) lozenge 3 mg  1 lozenge Oral PRN Leighton Parody, PA-C       Or   phenol (CHLORASEPTIC) mouth spray 1 spray  1 spray Mouth/Throat PRN Leighton Parody, PA-C       methocarbamol (ROBAXIN) tablet 500 mg  500 mg Oral Q6H PRN Leighton Parody, PA-C   500 mg at 06/04/21 6433   Or   methocarbamol (ROBAXIN) 500 mg in dextrose 5 % 50 mL IVPB  500 mg Intravenous Q6H PRN Leighton Parody, PA-C       metoCLOPramide (REGLAN) tablet 5-10 mg  5-10 mg Oral Q8H PRN Leighton Parody, PA-C       Or   metoCLOPramide (REGLAN) injection 5-10 mg  5-10 mg Intravenous Q8H PRN Joanell Rising K, PA-C       ondansetron [facility]) tablet 4 mg  4 mg Oral Q6H PRN Leighton Parody, PA-C       Or   ondansetron [facility]) injection 4 mg  4 mg Intravenous Q6H PRN Leighton Parody, PA-C       oxyCODONE (Oxy IR/ROXICODONE) immediate release tablet 10-15 mg  10-15 mg Oral Q4H PRN Leighton Parody, PA-C   15 mg at 06/04/21 1024   oxyCODONE (Oxy IR/ROXICODONE) immediate release tablet 5-10 mg  5-10 mg Oral Q4H PRN Leighton Parody, PA-C   5 mg at 06/03/21 1545   pantoprazole (PROTONIX) EC tablet 40 mg  40 mg Oral Daily Leighton Parody, PA-C   40 mg at 06/04/21 1025   polyethylene glycol (MIRALAX / GLYCOLAX) packet 17 g  17 g Oral Daily PRN Leighton Parody, PA-C       simethicone [facility]) chewable tablet  160 mg  160 mg Oral QID PRN Leighton Parody, PA-C       sodium phosphate (FLEET) 7-19 GM/118ML enema 1 enema  1 enema Rectal Once PRN Joanell Rising  K, PA-C       traZODone (DESYREL) tablet 50 mg  50 mg Oral QHS PRN Leighton Parody, PA-C         Discharge Medications: Please see discharge summary for a list of discharge medications.  Relevant Imaging Results:  Relevant Lab Results:   Additional Information SS# [id number]; Covid Vax x 2 (no boosters yet)  Cambree Hendrix, LCSW

## 2021-06-04 NOTE — TOC Transition Note (Signed)
Transition of Care [facility]) - CM/SW Discharge Note   Patient Details  Name: Logan [name] MRN: [id number] Date of Birth: 01-25-1946  Transition of Care [facility]) CM/SW Contact:  Lennart Pall, LCSW Phone Number: 06/04/2021, 9:55 AM   Clinical Narrative:    Met with pt today and confirming he has all needed DME at home.  HHPT prearranged with [facility].  NO TOC needs.   Final next level of care: [place] Barriers to Discharge: No Barriers Identified   Patient Goals and CMS Choice Patient states their goals for this hospitalization and ongoing recovery are:: return home      Discharge Placement                       Discharge Plan and Services                DME Arranged: N/A DME Agency: NA       HH Arranged: PT HH Agency: [place]        Social Determinants of Health (SDOH) Interventions     Readmission Risk Interventions Readmission Risk Prevention Plan 01/03/2021  Post Dischage Appt Not Complete  Appt Comments going to INPT rehab  Medication Screening Complete  Transportation Screening Complete  Some recent data might be hidden

## 2021-06-04 NOTE — TOC Progression Note (Signed)
Transition of Care [facility]) - Progression Note    Patient Details  Name: Logan [name] MRN: [id number] Date of Birth: 07-May-1945  Transition of Care [facility]) CM/SW Contact  Lennart Pall, LCSW Phone Number: 06/04/2021, 1:51 PM  Clinical Narrative:    UPDATE:  Alerted by PT of concerns that pt making very slow progress and will need significant assistance if he is to dc directly home.  PT is recommending SNF for rehab.   Met with pt and spoke with son about recommendation and they are both agreeable to change in plan to SNF and hopeful for facility in [facility] of possible.  Have alerted Ortho bundle CM and PA.     Barriers to Discharge: No Barriers Identified  Expected Discharge Plan and Services           Expected Discharge Date: 06/04/21               DME Arranged: N/A DME Agency: NA       HH Arranged: PT HH Agency: [place]         Social Determinants of Health (SDOH) Interventions    Readmission Risk Interventions Readmission Risk Prevention Plan 01/03/2021  Post Dischage Appt Not Complete  Appt Comments going to INPT rehab  Medication Screening Complete  Transportation Screening Complete  Some recent data might be hidden

## 2021-06-04 NOTE — Care Plan (Signed)
Patient PT progress noted. Spoke with MD and he is agreeable to ST SNF search. CSW working with patient and son to obtain offers.   Ladell Heads, [place]

## 2021-06-04 NOTE — Progress Notes (Signed)
Physical Therapy Treatment Patient Details Name: Logan [name] MRN: [id number] DOB: [birth year] Today's Date: 06/04/2021   History of Present Illness 76 yo male s/p L THA on 06/03/21. PMH: HTN, ACDF C3-4, afib, HF, DM, obesity, sleep apnea and chronic pain.    PT Comments    The  patient requires + 2 max assistance to  stand from recliner and step to the bed using Rw. Patient did utilize the Rw better and bearing more weight on the legs. Also has "bad " right shoulder with limited ROM and strength for support. Right leg continues to  have  difficulties due to varus deformity, appears more weight shifted to the left leg. Continue PT. Discussion had with patient  about need for rehab to  become more mobile and independent to prior to returning home.   Recommendations for follow up therapy are one component of a multi-disciplinary discharge planning process, led by the attending physician.  Recommendations may be updated based on patient status, additional functional criteria and insurance authorization.  Follow Up Recommendations  Skilled nursing-short term rehab (<3 hours/day)     Assistance Recommended at Discharge Frequent or constant Supervision/Assistance  Patient can return home with the following Assistance with cooking/housework;Assist for transportation;Help with stairs or ramp for entrance;Two people to help with walking and/or transfers;A lot of help with bathing/dressing/bathroom   Equipment Recommendations  None recommended by PT    Recommendations for Other Services       Precautions / Restrictions Precautions Precautions: Fall Precaution Comments: Rt knee DJD , decreased knee flexion on Rt. to stand up, also varus deformity     Mobility  Bed Mobility         Supine to sit: Max assist, +2 for physical assistance, +2 for safety/equipment     General bed mobility comments: +2  total assistance to  return to supine,assist both legs back onto bed and  support trunk     Transfers Overall transfer level: Needs assistance Equipment used: Rolling walker (2 wheels) Transfers: Sit to/from Stand Sit to Stand: Max assist, +2 physical assistance, +2 safety/equipment Stand pivot transfers: +2 safety/equipment, +2 physical assistance        Lateral/Scoot Transfers: Max assist General transfer comment: patient assisted to stand from recliner , patient then reaches for  RW, Able  to take samll pivot steps back to bed , max support for balance.    Ambulation/Gait                   Stairs             Wheelchair Mobility    Modified Rankin (Stroke Patients Only)       Balance   Sitting-balance support: Bilateral upper extremity supported, Feet supported Sitting balance-Leahy Scale: Poor Sitting balance - Comments: difficulty leaning forward in recliner and keeping balance.  requires UE support holding armrests   Standing balance support: Bilateral upper extremity supported, During functional activity, Reliant on assistive device for balance Standing balance-Leahy Scale: Zero Standing balance comment: Reliant on bileral UE and support of therapist to prevent posterior balance loss.                            Cognition Arousal/Alertness: Awake/alert Behavior During Therapy: WFL for tasks assessed/performed Overall Cognitive Status: Within Functional Limits for tasks assessed  Exercises General Exercises - Lower Extremity Long Arc Quad: AAROM, AROM, Both, 10 reps, Seated    General Comments        Pertinent Vitals/Pain Pain Assessment Pain Assessment: Faces Pain Score: 5  Faces Pain Scale: Hurts even more Pain Location: L hip and rt shoulder Pain Descriptors / Indicators: Discomfort, Aching, Grimacing Pain Intervention(s): Monitored during session, Patient requesting pain meds-RN notified, Ice applied, Repositioned    Home Living                           Prior Function            PT Goals (current goals can now be found in the care plan section) Progress towards PT goals: Progressing toward goals    Frequency    7X/week      PT Plan Current plan remains appropriate    Co-evaluation              AM-PAC PT "6 Clicks" Mobility   Outcome Measure  Help needed turning from your back to your side while in a flat bed without using bedrails?: A Lot Help needed moving from lying on your back to sitting on the side of a flat bed without using bedrails?: Total Help needed moving to and from a bed to a chair (including a wheelchair)?: A Lot Help needed standing up from a chair using your arms (e.g., wheelchair or bedside chair)?: A Lot Help needed to walk in hospital room?: Total Help needed climbing 3-5 steps with a railing? : Total 6 Click Score: 9    End of Session Equipment Utilized During Treatment: Gait belt Activity Tolerance: Patient tolerated treatment well Patient left: in bed;with call bell/phone within reach;with bed alarm set;with nursing/sitter in room Nurse Communication: Mobility status PT Visit Diagnosis: Other abnormalities of gait and mobility (R26.89);Muscle weakness (generalized) (M62.81);Difficulty in walking, not elsewhere classified (R26.2);History of falling (Z91.81)     Time: 1342-1401 PT Time Calculation (min) (ACUTE ONLY): 19 min  Charges:  $Therapeutic Activity: 8-22 mins                     [name] PT Acute Rehabilitation Services Pager [phone] Office [phone]    [name] 06/04/2021, 3:33 PM

## 2021-06-05 ENCOUNTER — Encounter: Payer: Medicare HMO | Admitting: Physical Medicine and Rehabilitation

## 2021-06-05 DIAGNOSIS — M1612 Unilateral primary osteoarthritis, left hip: Secondary | ICD-10-CM | POA: Diagnosis not present

## 2021-06-05 DIAGNOSIS — I701 Atherosclerosis of renal artery: Secondary | ICD-10-CM | POA: Diagnosis present

## 2021-06-05 DIAGNOSIS — J9811 Atelectasis: Secondary | ICD-10-CM | POA: Diagnosis not present

## 2021-06-05 DIAGNOSIS — Z981 Arthrodesis status: Secondary | ICD-10-CM | POA: Diagnosis not present

## 2021-06-05 DIAGNOSIS — I10 Essential (primary) hypertension: Secondary | ICD-10-CM | POA: Diagnosis not present

## 2021-06-05 DIAGNOSIS — Z7901 Long term (current) use of anticoagulants: Secondary | ICD-10-CM | POA: Diagnosis not present

## 2021-06-05 DIAGNOSIS — I4821 Permanent atrial fibrillation: Secondary | ICD-10-CM | POA: Diagnosis not present

## 2021-06-05 DIAGNOSIS — R0602 Shortness of breath: Secondary | ICD-10-CM | POA: Diagnosis not present

## 2021-06-05 DIAGNOSIS — E119 Type 2 diabetes mellitus without complications: Secondary | ICD-10-CM | POA: Diagnosis present

## 2021-06-05 DIAGNOSIS — Z87891 Personal history of nicotine dependence: Secondary | ICD-10-CM | POA: Diagnosis not present

## 2021-06-05 DIAGNOSIS — I4819 Other persistent atrial fibrillation: Secondary | ICD-10-CM | POA: Diagnosis present

## 2021-06-05 DIAGNOSIS — T81718A Complication of other artery following a procedure, not elsewhere classified, initial encounter: Secondary | ICD-10-CM | POA: Diagnosis not present

## 2021-06-05 DIAGNOSIS — K219 Gastro-esophageal reflux disease without esophagitis: Secondary | ICD-10-CM | POA: Diagnosis present

## 2021-06-05 DIAGNOSIS — J80 Acute respiratory distress syndrome: Secondary | ICD-10-CM | POA: Diagnosis not present

## 2021-06-05 DIAGNOSIS — I959 Hypotension, unspecified: Secondary | ICD-10-CM | POA: Diagnosis not present

## 2021-06-05 DIAGNOSIS — R0902 Hypoxemia: Secondary | ICD-10-CM | POA: Diagnosis not present

## 2021-06-05 DIAGNOSIS — Z7401 Bed confinement status: Secondary | ICD-10-CM | POA: Diagnosis not present

## 2021-06-05 DIAGNOSIS — J690 Pneumonitis due to inhalation of food and vomit: Secondary | ICD-10-CM | POA: Diagnosis not present

## 2021-06-05 DIAGNOSIS — J90 Pleural effusion, not elsewhere classified: Secondary | ICD-10-CM | POA: Diagnosis not present

## 2021-06-05 DIAGNOSIS — J8410 Pulmonary fibrosis, unspecified: Secondary | ICD-10-CM | POA: Diagnosis not present

## 2021-06-05 DIAGNOSIS — I2602 Saddle embolus of pulmonary artery with acute cor pulmonale: Secondary | ICD-10-CM | POA: Diagnosis not present

## 2021-06-05 DIAGNOSIS — Z20822 Contact with and (suspected) exposure to covid-19: Secondary | ICD-10-CM | POA: Diagnosis not present

## 2021-06-05 DIAGNOSIS — I110 Hypertensive heart disease with heart failure: Secondary | ICD-10-CM | POA: Diagnosis not present

## 2021-06-05 DIAGNOSIS — I5032 Chronic diastolic (congestive) heart failure: Secondary | ICD-10-CM | POA: Diagnosis not present

## 2021-06-05 DIAGNOSIS — J9601 Acute respiratory failure with hypoxia: Secondary | ICD-10-CM | POA: Diagnosis not present

## 2021-06-05 DIAGNOSIS — G4730 Sleep apnea, unspecified: Secondary | ICD-10-CM | POA: Diagnosis present

## 2021-06-05 DIAGNOSIS — Z96642 Presence of left artificial hip joint: Secondary | ICD-10-CM | POA: Diagnosis not present

## 2021-06-05 DIAGNOSIS — R0689 Other abnormalities of breathing: Secondary | ICD-10-CM | POA: Diagnosis not present

## 2021-06-05 DIAGNOSIS — M25552 Pain in left hip: Secondary | ICD-10-CM | POA: Diagnosis not present

## 2021-06-05 DIAGNOSIS — Y831 Surgical operation with implant of artificial internal device as the cause of abnormal reaction of the patient, or of later complication, without mention of misadventure at the time of the procedure: Secondary | ICD-10-CM | POA: Diagnosis present

## 2021-06-05 DIAGNOSIS — F29 Unspecified psychosis not due to a substance or known physiological condition: Secondary | ICD-10-CM | POA: Diagnosis not present

## 2021-06-05 DIAGNOSIS — R062 Wheezing: Secondary | ICD-10-CM | POA: Diagnosis not present

## 2021-06-05 DIAGNOSIS — X58XXXA Exposure to other specified factors, initial encounter: Secondary | ICD-10-CM | POA: Diagnosis present

## 2021-06-05 DIAGNOSIS — I2694 Multiple subsegmental pulmonary emboli without acute cor pulmonale: Secondary | ICD-10-CM | POA: Diagnosis present

## 2021-06-05 DIAGNOSIS — D62 Acute posthemorrhagic anemia: Secondary | ICD-10-CM | POA: Diagnosis not present

## 2021-06-05 DIAGNOSIS — Z86718 Personal history of other venous thrombosis and embolism: Secondary | ICD-10-CM | POA: Diagnosis not present

## 2021-06-05 DIAGNOSIS — D539 Nutritional anemia, unspecified: Secondary | ICD-10-CM | POA: Diagnosis not present

## 2021-06-05 DIAGNOSIS — Z808 Family history of malignant neoplasm of other organs or systems: Secondary | ICD-10-CM | POA: Diagnosis not present

## 2021-06-05 DIAGNOSIS — R531 Weakness: Secondary | ICD-10-CM | POA: Diagnosis not present

## 2021-06-05 DIAGNOSIS — Z8249 Family history of ischemic heart disease and other diseases of the circulatory system: Secondary | ICD-10-CM | POA: Diagnosis not present

## 2021-06-05 DIAGNOSIS — I2699 Other pulmonary embolism without acute cor pulmonale: Secondary | ICD-10-CM | POA: Diagnosis not present

## 2021-06-05 DIAGNOSIS — M109 Gout, unspecified: Secondary | ICD-10-CM | POA: Diagnosis not present

## 2021-06-05 DIAGNOSIS — R339 Retention of urine, unspecified: Secondary | ICD-10-CM | POA: Diagnosis not present

## 2021-06-05 DIAGNOSIS — Z471 Aftercare following joint replacement surgery: Secondary | ICD-10-CM | POA: Diagnosis not present

## 2021-06-05 LAB — CBC
HCT: 27.9 % — ABNORMAL LOW (ref 39.0–52.0)
Hemoglobin: 9 g/dL — ABNORMAL LOW (ref 13.0–17.0)
MCH: 33 pg (ref 26.0–34.0)
MCHC: 32.3 g/dL (ref 30.0–36.0)
MCV: 102.2 fL — ABNORMAL HIGH (ref 80.0–100.0)
Platelets: 218 10*3/uL (ref 150–400)
RBC: 2.73 MIL/uL — ABNORMAL LOW (ref 4.22–5.81)
RDW: 15.3 % (ref 11.5–15.5)
WBC: 12.6 10*3/uL — ABNORMAL HIGH (ref 4.0–10.5)
nRBC: 0 % (ref 0.0–0.2)

## 2021-06-05 LAB — RESP PANEL BY RT-PCR (FLU A&B, COVID) ARPGX2
Influenza A by PCR: NEGATIVE
Influenza B by PCR: NEGATIVE
SARS Coronavirus 2 by RT PCR: NEGATIVE

## 2021-06-05 LAB — GLUCOSE, CAPILLARY: Glucose-Capillary: 131 mg/dL — ABNORMAL HIGH (ref 70–99)

## 2021-06-05 MED ORDER — APIXABAN 2.5 MG PO TABS
2.5000 mg | ORAL_TABLET | Freq: Two times a day (BID) | ORAL | 0 refills | Status: DC
Start: 2021-06-05 — End: 2021-06-13

## 2021-06-05 MED ORDER — OXYCODONE-ACETAMINOPHEN 5-325 MG PO TABS: 1.0000 | ORAL_TABLET | ORAL | 0 refills | Status: DC | PRN

## 2021-06-05 MED ORDER — TIZANIDINE HCL 2 MG PO TABS
2.0000 mg | ORAL_TABLET | Freq: Four times a day (QID) | ORAL | 0 refills | Status: DC | PRN
Start: 2021-06-05 — End: 2021-09-19

## 2021-06-05 NOTE — TOC Progression Note (Signed)
Transition of Care [facility]) - Progression Note    Patient Details  Name: Logan [name] MRN: [id number] Date of Birth: Aug 20, 1945  Transition of Care [facility]) CM/SW Contact  Lennart Pall, LCSW Phone Number: 06/05/2021, 1:44 PM  Clinical Narrative:    Have reviewed SNF bed offers with pt and son and bed has been accepted at [facility].  Facility has begun insurance authorization, however, do not anticipate securing this until tomorrow.  Have alerted PA/RN and pt and son.  Plan for dc tomorrow.     Barriers to Discharge: No Barriers Identified  Expected Discharge Plan and Services           Expected Discharge Date: 06/05/21               DME Arranged: N/A DME Agency: NA       HH Arranged: PT HH Agency: [facility]         Social Determinants of Health (SDOH) Interventions    Readmission Risk Interventions Readmission Risk Prevention Plan 01/03/2021  Post Dischage Appt Not Complete  Appt Comments going to INPT rehab  Medication Screening Complete  Transportation Screening Complete  Some recent data might be hidden

## 2021-06-05 NOTE — Progress Notes (Signed)
PATIENT ID: Logan [name]  MRN: [id number]  DOB/AGE:  12-27-1945 / 76 y.o.  2 Days Post-Op Procedure(s) (LRB): LEFT TOTAL HIP ARTHROPLASTY ANTERIOR APPROACH (Left)    PROGRESS NOTE Subjective: Patient is alert, oriented, no Nausea, no Vomiting, yes passing gas, . Taking PO well. Denies SOB, Chest or Calf Pain. Using Incentive Spirometer, PAS in place. Ambulate WBAT with pt working on transfers Patient reports pain as  8/10  .    Objective: Vital signs in last 24 hours: Vitals:   06/04/21 0512 06/04/21 1400 06/04/21 2115 06/05/21 0618  BP: (!) 133/92 132/67 127/63 121/64  Pulse: 88 67 70 70  Resp: 19 18 16 17   Temp: 98.2 F (36.8 C) 97.7 F (36.5 C) 98 F (36.7 C) 97.9 F (36.6 C)  TempSrc: Oral Oral Oral Oral  SpO2: 99% 98% 99% 95%  Weight:      Height:          Intake/Output from previous day: I/O last 3 completed shifts: In: 2577.1 [P.O.:720; I.V.:1857.1] Out: 1350 [Urine:1350]   Intake/Output this shift: No intake/output data recorded.   LABORATORY DATA: Recent Labs    06/03/21 0523 06/04/21 0328 06/05/21 0331  WBC  --  12.0* 12.6*  HGB  --  9.2* 9.0*  HCT  --  28.7* 27.9*  PLT  --  223 218  NA  --  136  --   K  --  3.3*  --   CL  --  100  --   CO2  --  32  --   BUN  --  10  --   CREATININE  --  0.67  --   GLUCOSE  --  122*  --   GLUCAP 102*  --   --   CALCIUM  --  8.5*  --     Examination: Neurologically intact Neurovascular intact Sensation intact distally Intact pulses distally Dorsiflexion/Plantar flexion intact Incision: dressing C/D/I and no drainage No cellulitis present Compartment soft} XR AP&Lat of hip shows well placed\fixed THA  Assessment:   2 Days Post-Op Procedure(s) (LRB): LEFT TOTAL HIP ARTHROPLASTY ANTERIOR APPROACH (Left) ADDITIONAL DIAGNOSIS:  Expected Acute Blood Loss Anemia, Diabetes, Cardiac Arrythmia - afib, and Sleep Apnea Anticipated LOS equal to or greater than 2 midnights due to - Age 74 and older with one or more of  the following:  - Obesity  - Expected need for hospital services (PT, OT, Nursing) required for safe  discharge  - Anticipated need for postoperative skilled nursing care or inpatient rehab  - Active co-morbidities: Chronic pain requiring opiods and Cardiac Arrhythmia OR   - Unanticipated findings during/Post Surgery: Slow post-op progression: GI, pain control, mobility     Plan: PT/OT WBAT, THA  DVT Prophylaxis: SCDx72 hrs, Eliquis 2.5 mg BID x 2 weeks  DISCHARGE PLAN: Skilled Nursing Facility/Rehab  DISCHARGE NEEDS: HHPT, HHRN, Walker, and 3-in-1 comode seat

## 2021-06-05 NOTE — Plan of Care (Signed)
°  Problem: Clinical Measurements: Goal: Respiratory complications will improve Outcome: Progressing   Problem: Activity: Goal: Risk for activity intolerance will decrease Outcome: Progressing   Problem: Activity: Goal: Ability to tolerate increased activity will improve Outcome: Progressing   Problem: Activity: Goal: Ability to tolerate increased activity will improve Outcome: Progressing

## 2021-06-05 NOTE — Progress Notes (Signed)
Physical Therapy Treatment Patient Details Name: Logan [name] MRN: [id number] DOB: Jul 29, 1945 Today's Date: 06/05/2021   History of Present Illness 76 yo male s/p L THA on 06/03/21. PMH: HTN, ACDF C3-4, afib, HF, DM, obesity, sleep apnea and chronic pain.    PT Comments    POD # 2 am session Pt NOT progressing as expected.  Requires + 2 assist and unable to functionally amb at a level safe to D/C to home. Assisted OOB was difficult.  General bed mobility comments: +2  total assis to transition from supine to EOB using bed pad to complete scooting.  Difficulty using R UE "bad shoulder".  General transfer comment: pt required + 2 side by side assist to riswe even from an elevated surface.  "I can't put no weight on my left" due to increased pain and "my right knee is buckling".  Used B platform EVA walker for increased support as pt was unable to safely stand with a traditional walker. General Gait Details: using B platform EVA walker and + 2 assist pt was only able to progress gait to 8 feet with [facility] difficulty.  pt was unable to fully support himself upright with c/o L hip pain and R knee buckling plus R shoulder "gives way".  Recliner following closely behind for safety. Positioned in recliner to comfort as pt was unable to self scoot back.   Pt will need ST Rehab at SNF prior to safely returning home with spouse.   Recommendations for follow up therapy are one component of a multi-disciplinary discharge planning process, led by the attending physician.  Recommendations may be updated based on patient status, additional functional criteria and insurance authorization.  Follow Up Recommendations  Skilled nursing-short term rehab (<3 hours/day)     Assistance Recommended at Discharge Frequent or constant Supervision/Assistance  Patient can return home with the following Assistance with cooking/housework;Assist for transportation;Help with stairs or ramp for entrance;Two people to help with  walking and/or transfers;A lot of help with bathing/dressing/bathroom   Equipment Recommendations  None recommended by PT    Recommendations for Other Services       Precautions / Restrictions Precautions Precautions: Fall Precaution Comments: Rt knee DJD , decreased knee flexion on Rt. to stand up, also varus deformity plus a "bad" R shoulder Restrictions Weight Bearing Restrictions: No Other Position/Activity Restrictions: WBAT     Mobility  Bed Mobility Overal bed mobility: Needs Assistance Bed Mobility: Supine to Sit     Supine to sit: Max assist, +2 for physical assistance, +2 for safety/equipment     General bed mobility comments: +2  total assis to transition from supine to EOB using bed pad to complete scooting.  Difficulty using R UE "bad shoulder".    Transfers Overall transfer level: Needs assistance Equipment used: Rolling walker (2 wheels) Transfers: Sit to/from Stand Sit to Stand: Max assist, +2 physical assistance, +2 safety/equipment Stand pivot transfers: +2 safety/equipment, +2 physical assistance         General transfer comment: pt required + 2 side by side assist to riswe even from an elevated surface.  "I can't put no weight on my left" due to increased pain and "my right knee is buckling".  Used B platform EVA walker for increased support as pt was unable to safely stand with a traditional walker.    Ambulation/Gait Ambulation/Gait assistance: Max assist, Total assist, +2 physical assistance, +2 safety/equipment Gait Distance (Feet): 8 Feet Assistive device: Bilateral platform walker (EVA walker) Gait Pattern/deviations: Step-to pattern,  Decreased step length - left, Decreased step length - right Gait velocity: decreased     General Gait Details: using B platform EVA walker and + 2 assist pt was only able to progress gait to 8 feet with MUCH difficulty.  pt was unable to fully support himself upright with c/o L hip pain and R knee buckling plus  R shoulder "gives way".  Recliner following closely behind for safety.   Stairs             Wheelchair Mobility    Modified Rankin (Stroke Patients Only)       Balance                                            Cognition Arousal/Alertness: Awake/alert Behavior During Therapy: WFL for tasks assessed/performed Overall Cognitive Status: Within Functional Limits for tasks assessed                                 General Comments: Pt is AxO x 3 very willing but present with multiple "other" joint problems.  Pt stated he spent 2 months at Rehab after cervical fusion.        Exercises      General Comments        Pertinent Vitals/Pain Pain Assessment Pain Assessment: Faces Faces Pain Scale: Hurts little more Pain Location: L hip and rt shoulder Pain Descriptors / Indicators: Discomfort, Aching, Grimacing Pain Intervention(s): Monitored during session, Premedicated before session, Repositioned, Ice applied    Home Living                          Prior Function            PT Goals (current goals can now be found in the care plan section) Progress towards PT goals: Progressing toward goals    Frequency    7X/week      PT Plan Current plan remains appropriate    Co-evaluation              AM-PAC PT "6 Clicks" Mobility   Outcome Measure  Help needed turning from your back to your side while in a flat bed without using bedrails?: A Lot Help needed moving from lying on your back to sitting on the side of a flat bed without using bedrails?: A Lot Help needed moving to and from a bed to a chair (including a wheelchair)?: Total Help needed standing up from a chair using your arms (e.g., wheelchair or bedside chair)?: Total Help needed to walk in hospital room?: Total Help needed climbing 3-5 steps with a railing? : Total 6 Click Score: 8    End of Session Equipment Utilized During Treatment: Gait  belt Activity Tolerance: Patient tolerated treatment well Patient left: in bed;with call bell/phone within reach;with bed alarm set;with nursing/sitter in room Nurse Communication: Mobility status PT Visit Diagnosis: Other abnormalities of gait and mobility (R26.89);Muscle weakness (generalized) (M62.81);Difficulty in walking, not elsewhere classified (R26.2);History of falling (Z91.81)     Time: 2536-6440 PT Time Calculation (min) (ACUTE ONLY): 31 min  Charges:  $Gait Training: 8-22 mins $Therapeutic Activity: 8-22 mins                     {Cariah Salatino  PTA Acute  Rehabilitation Services Pager      [phone] Office      [phone]

## 2021-06-05 NOTE — Progress Notes (Signed)
Physical Therapy Treatment Patient Details Name: Logan [name] MRN: [id number] DOB: April 19, 1946 Today's Date: 06/05/2021   History of Present Illness 76 yo male s/p L THA on 06/03/21. PMH: HTN, ACDF C3-4, afib, HF, DM, obesity, sleep apnea and chronic pain.    PT Comments    POD # 2 pm session Pt called to get back to bed.  General transfer comment: Pt  required + 2 Max Assist to get back to bed.  Also required assist to support L LE up into bed then scoot to [facility]. Positioned to comfort.  Pt is unable to functionally amb after surgery.  Pt lives alone and will need ST Rehab at SNF prior to returning.    Recommendations for follow up therapy are one component of a multi-disciplinary discharge planning process, led by the attending physician.  Recommendations may be updated based on patient status, additional functional criteria and insurance authorization.  Follow Up Recommendations  Skilled nursing-short term rehab (<3 hours/day)     Assistance Recommended at Discharge Frequent or constant Supervision/Assistance  Patient can return home with the following Assistance with cooking/housework;Assist for transportation;Help with stairs or ramp for entrance;Two people to help with walking and/or transfers;A lot of help with bathing/dressing/bathroom   Equipment Recommendations  None recommended by PT    Recommendations for Other Services       Precautions / Restrictions Precautions Precautions: Fall Precaution Comments: Rt knee DJD , decreased knee flexion on Rt. to stand up, also varus deformity plus a "bad" R shoulder Restrictions Weight Bearing Restrictions: No Other Position/Activity Restrictions: WBAT     Mobility  Bed Mobility Overal bed mobility: Needs Assistance Bed Mobility: Sit to Supine     Supine to sit: Max assist, +2 for physical assistance, +2 for safety/equipment Sit to supine: Mod assist, Max assist   General bed mobility comments: + 2 ModMax Assist back to bed and  position to comfort.    Transfers Overall transfer level: Needs assistance Equipment used: Rolling walker (2 wheels) Transfers: Sit to/from Stand Sit to Stand: Max assist, +2 physical assistance, +2 safety/equipment Stand pivot transfers: +2 safety/equipment, +2 physical assistance         General transfer comment: Pt  required + 2 Max Assist to get back to bed.  Also required assist to support L LE up into bed then scoot to [facility].    Ambulation/Gait Ambulation/Gait assistance: Max assist, Total assist, +2 physical assistance, +2 safety/equipment Gait Distance (Feet): 8 Feet Assistive device: Bilateral platform walker (EVA walker) Gait Pattern/deviations: Step-to pattern, Decreased step length - left, Decreased step length - right Gait velocity: decreased     General Gait Details: transfer only this session due to fatigue.   Stairs             Wheelchair Mobility    Modified Rankin (Stroke Patients Only)       Balance                                            Cognition Arousal/Alertness: Awake/alert Behavior During Therapy: WFL for tasks assessed/performed Overall Cognitive Status: Within Functional Limits for tasks assessed                                 General Comments: Pt is AxO x 3 very willing but present with multiple "  other" joint problems.  Pt stated he spent 2 months at Rehab after cervical fusion.        Exercises      General Comments        Pertinent Vitals/Pain Pain Assessment Pain Assessment: Faces Faces Pain Scale: Hurts little more Pain Location: L hip and rt shoulder Pain Descriptors / Indicators: Discomfort, Aching, Grimacing Pain Intervention(s): Monitored during session, Premedicated before session, Repositioned, Ice applied    Home Living                          Prior Function            PT Goals (current goals can now be found in the care plan section) Progress towards PT  goals: Progressing toward goals    Frequency    7X/week      PT Plan Current plan remains appropriate    Co-evaluation              AM-PAC PT "6 Clicks" Mobility   Outcome Measure  Help needed turning from your back to your side while in a flat bed without using bedrails?: A Lot Help needed moving from lying on your back to sitting on the side of a flat bed without using bedrails?: A Lot Help needed moving to and from a bed to a chair (including a wheelchair)?: A Lot Help needed standing up from a chair using your arms (e.g., wheelchair or bedside chair)?: Total Help needed to walk in hospital room?: Total Help needed climbing 3-5 steps with a railing? : Total 6 Click Score: 9    End of Session Equipment Utilized During Treatment: Gait belt Activity Tolerance: Patient tolerated treatment well Patient left: in bed;with call bell/phone within reach;with bed alarm set;with nursing/sitter in room Nurse Communication: Mobility status PT Visit Diagnosis: Other abnormalities of gait and mobility (R26.89);Muscle weakness (generalized) (M62.81);Difficulty in walking, not elsewhere classified (R26.2);History of falling (Z91.81)     Time: 1300-1315 PT Time Calculation (min) (ACUTE ONLY): 15 min  Charges:  $Gait Training: 8-22 mins $Therapeutic Activity: 8-22 mins                     {Abdulwahab Demelo  PTA Acute  Rehabilitation Services Pager      [phone] Office      [phone]

## 2021-06-05 NOTE — Discharge Summary (Addendum)
Patient ID: Logan [name] MRN: [id number] DOB/AGE: 1945/07/10 76 y.o.  Admit date: 06/03/2021 Discharge date:  06/06/2021  Admission Diagnoses:  Principal Problem:   H/O total hip arthroplasty, left Active Problems:   Primary osteoarthritis of left hip   Discharge Diagnoses:  Same  Past Medical History:  Diagnosis Date   Anxiety    Atherosclerotic renal artery stenosis, unilateral (HCC) 02/28/2014   Atrial fibrillation (HCC)    Bilateral lower extremity edema    Chronic diastolic heart failure ([place]) 10/21/2019   Diabetes mellitus without complication (HCC)    DVT (deep venous thrombosis) (HCC)    Dysrhythmia    Esophageal reflux    Essential hypertension 10/21/2019   Gout    Hyperlipidemia    Hypertension    Mixed hyperlipidemia 10/21/2019   Morbid obesity ([place])    Obesity (BMI 30-39.9) 08/24/2019   Orthopnea    Osteoarthritis    Persistent atrial fibrillation ([place]) 10/21/2019   Pneumonia    Seasonal allergies    Sleep apnea     Surgeries: Procedure(s): LEFT TOTAL HIP ARTHROPLASTY ANTERIOR APPROACH on 06/03/2021   Consultants:   Discharged Condition: Improved  Hospital Course: Logan [name] is an 76 y.o. male who was admitted 06/03/2021 for operative treatment ofH/O total hip arthroplasty, left. Patient has severe unremitting pain that affects sleep, daily activities, and work/hobbies. After pre-op clearance the patient was taken to the operating room on 06/03/2021 and underwent  Procedure(s): LEFT TOTAL HIP ARTHROPLASTY ANTERIOR APPROACH.    Patient was given perioperative antibiotics:  Anti-infectives (From admission, onward)    Start     Dose/Rate Route Frequency Ordered Stop   06/03/21 0600  ceFAZolin (ANCEF) IVPB 2g/100 mL premix        2 g 200 mL/hr over 30 Minutes Intravenous On call to O.R. 06/03/21 0518 06/03/21 0736        Patient was given sequential compression devices, early ambulation, and chemoprophylaxis to prevent DVT.  Patient benefited  maximally from hospital stay and there were no complications.    Recent vital signs: Patient Vitals for the past 24 hrs:  BP Temp Temp src Pulse Resp SpO2  06/05/21 0618 121/64 97.9 F (36.6 C) Oral 70 17 95 %  06/04/21 2115 127/63 98 F (36.7 C) Oral 70 16 99 %  06/04/21 1400 132/67 97.7 F (36.5 C) Oral 67 18 98 %     Recent laboratory studies:  Recent Labs    06/04/21 0328 06/05/21 0331  WBC 12.0* 12.6*  HGB 9.2* 9.0*  HCT 28.7* 27.9*  PLT 223 218  NA 136  --   K 3.3*  --   CL 100  --   CO2 32  --   BUN 10  --   CREATININE 0.67  --   GLUCOSE 122*  --   CALCIUM 8.5*  --      Discharge Medications:   Allergies as of 06/05/2021   No Known Allergies      Medication List     STOP taking these medications    traMADol 50 MG tablet Commonly known as: ULTRAM       TAKE these medications    acetaminophen 500 MG tablet Commonly known as: TYLENOL Take 1,000 mg by mouth every 6 (six) hours as needed for mild pain.   allopurinol 300 MG tablet Commonly known as: ZYLOPRIM Take 300 mg by mouth daily.   apixaban 2.5 MG Tabs tablet Commonly known as: Eliquis Take 1 tablet (2.5 mg total) by mouth 2 (  two) times daily. 2.5 mg BID x 2 weeks and then back to normal dose of 5 mg BID What changed:  medication strength how much to take additional instructions   carvedilol 12.5 MG tablet Commonly known as: COREG Take 1 tablet (12.5 mg total) by mouth 2 (two) times daily with a meal.   DHEA 25 MG Caps Take 25 mg by mouth daily.   diclofenac Sodium 1 % Gel Commonly known as: VOLTAREN Apply 2 g topically 4 (four) times daily. To left hip What changed:  when to take this reasons to take this   fluticasone 50 MCG/ACT nasal spray Commonly known as: FLONASE Place 1 spray into both nostrils daily as needed for rhinitis.   furosemide 40 MG tablet Commonly known as: LASIX Take 1 tablet (40 mg total) by mouth daily.   ICY HOT EX Apply 1 application topically daily  as needed (pain).   loratadine 10 MG tablet Commonly known as: CLARITIN Take 10 mg by mouth daily as needed for allergies.   losartan 50 MG tablet Commonly known as: COZAAR Take 1 tablet (50 mg total) by mouth daily.   multivitamin tablet Take 1 tablet by mouth daily.   omeprazole 20 MG capsule Commonly known as: PRILOSEC Take 20 mg by mouth daily as needed (for heartburn).   oxyCODONE-acetaminophen 5-325 MG tablet Commonly known as: PERCOCET/ROXICET Take 1 tablet by mouth every 4 (four) hours as needed for severe pain.   potassium chloride SA 20 MEQ tablet Commonly known as: KLOR-CON M Take 20 mEq by mouth daily.   potassium chloride 10 MEQ tablet Commonly known as: KLOR-CON M Take 2 tablets (20 mEq total) by mouth 2 (two) times daily.   tiZANidine 2 MG tablet Commonly known as: ZANAFLEX Take 1 tablet (2 mg total) by mouth every 6 (six) hours as needed.   traZODone 50 MG tablet Commonly known as: DESYREL Take 1 tablet (50 mg total) by mouth at bedtime as needed for sleep.   V-R GAS RELIEF 80 MG chewable tablet Generic drug: simethicone Chew 2 tablets (160 mg total) by mouth 4 (four) times daily. What changed:  when to take this reasons to take this   vitamin C 500 MG tablet Commonly known as: ASCORBIC ACID Take 1 tablet (500 mg total) by mouth daily.               Durable Medical Equipment  (From admission, onward)           Start     Ordered   06/03/21 1147  DME Walker rolling  Once       Question:  Patient needs a walker to treat with the following condition  Answer:  Status post total hip replacement, left   06/03/21 1146   06/03/21 1147  DME 3 n 1  Once        06/03/21 1146              Discharge Care Instructions  (From admission, onward)           Start     Ordered   06/05/21 0000  Weight bearing as tolerated        06/05/21 0803   06/03/21 0000  Weight bearing as tolerated        06/03/21 1027             Diagnostic Studies: DG Chest 2 View  Result Date: 05/28/2021 CLINICAL DATA:  Preop evaluation, upcoming hip replacement EXAM: CHEST - 2 VIEW  COMPARISON:  05/02/2021 FINDINGS: Cardiac shadow is within normal limits. Aortic calcifications are noted. Atelectatic changes are noted in the left base. No sizable infiltrate or effusion is noted. Degenerative changes of the thoracic spine are noted. IMPRESSION: Mild left basilar atelectasis. Electronically Signed   By: Inez Catalina M.D.   On: 05/28/2021 09:30   DG C-Arm 1-60 Min-No Report  Result Date: 06/03/2021 Fluoroscopy was utilized by the requesting physician.  No radiographic interpretation.   DG HIP OPERATIVE UNILAT WITH PELVIS LEFT  Result Date: 06/03/2021 CLINICAL DATA:  Fluoroscopy for anterior left hip replacement. EXAM: OPERATIVE  HIP (WITH PELVIS IF PERFORMED)  VIEWS TECHNIQUE: Fluoroscopic spot image(s) were submitted for interpretation post-operatively. COMPARISON:  Left hip radiographs 05/02/2021 FINDINGS: Images were performed intraoperatively without the presence of a radiologist. The patient appears to be undergoing total left hip arthroplasty. A single cerclage wire overlies the intertrochanteric region of the left femur. Please see intraoperative findings for further detail. IMPRESSION: Fluoroscopy for left hip arthroplasty. Electronically Signed   By: Yvonne Kendall M.D.   On: 06/03/2021 10:35    Disposition: Discharge disposition: 03-Skilled Nursing Facility       Discharge Instructions     Call MD / Call 911   Complete by: As directed    If you experience chest pain or shortness of breath, CALL 911 and be transported to the hospital emergency room.  If you develope a fever above 101 F, pus (white drainage) or increased drainage or redness at the wound, or calf pain, call your surgeon's office.   Call MD / Call 911   Complete by: As directed    If you experience chest pain or shortness of breath, CALL 911 and be  transported to the hospital emergency room.  If you develope a fever above 101 F, pus (white drainage) or increased drainage or redness at the wound, or calf pain, call your surgeon's office.   Constipation Prevention   Complete by: As directed    Drink plenty of fluids.  Prune juice may be helpful.  You may use a stool softener, such as Colace (over the counter) 100 mg twice a day.  Use MiraLax (over the counter) for constipation as needed.   Constipation Prevention   Complete by: As directed    Drink plenty of fluids.  Prune juice may be helpful.  You may use a stool softener, such as Colace (over the counter) 100 mg twice a day.  Use MiraLax (over the counter) for constipation as needed.   Diet - low sodium heart healthy   Complete by: As directed    Driving restrictions   Complete by: As directed    No driving for 2 weeks   Driving restrictions   Complete by: As directed    No driving for 2 weeks   Increase activity slowly as tolerated   Complete by: As directed    Increase activity slowly as tolerated   Complete by: As directed    Patient may shower   Complete by: As directed    You may shower without a dressing once there is no drainage.  Do not wash over the wound.  If drainage remains, cover wound with plastic wrap and then shower.   Patient may shower   Complete by: As directed    You may shower without a dressing once there is no drainage.  Do not wash over the wound.  If drainage remains, cover wound with plastic wrap and then shower.  Post-operative opioid taper instructions:   Complete by: As directed    POST-OPERATIVE OPIOID TAPER INSTRUCTIONS: It is important to wean off of your opioid medication as soon as possible. If you do not need pain medication after your surgery it is ok to stop day one. Opioids include: Codeine, Hydrocodone(Norco, Vicodin), Oxycodone(Percocet, oxycontin) and hydromorphone amongst others.  Long term and even short term use of opiods can  cause: Increased pain response Dependence Constipation Depression Respiratory depression And more.  Withdrawal symptoms can include Flu like symptoms Nausea, vomiting And more Techniques to manage these symptoms Hydrate well Eat regular healthy meals Stay active Use relaxation techniques(deep breathing, meditating, yoga) Do Not substitute Alcohol to help with tapering If you have been on opioids for less than two weeks and do not have pain than it is ok to stop all together.  Plan to wean off of opioids This plan should start within one week post op of your joint replacement. Maintain the same interval or time between taking each dose and first decrease the dose.  Cut the total daily intake of opioids by one tablet each day Next start to increase the time between doses. The last dose that should be eliminated is the evening dose.      Post-operative opioid taper instructions:   Complete by: As directed    POST-OPERATIVE OPIOID TAPER INSTRUCTIONS: It is important to wean off of your opioid medication as soon as possible. If you do not need pain medication after your surgery it is ok to stop day one. Opioids include: Codeine, Hydrocodone(Norco, Vicodin), Oxycodone(Percocet, oxycontin) and hydromorphone amongst others.  Long term and even short term use of opiods can cause: Increased pain response Dependence Constipation Depression Respiratory depression And more.  Withdrawal symptoms can include Flu like symptoms Nausea, vomiting And more Techniques to manage these symptoms Hydrate well Eat regular healthy meals Stay active Use relaxation techniques(deep breathing, meditating, yoga) Do Not substitute Alcohol to help with tapering If you have been on opioids for less than two weeks and do not have pain than it is ok to stop all together.  Plan to wean off of opioids This plan should start within one week post op of your joint replacement. Maintain the same interval or  time between taking each dose and first decrease the dose.  Cut the total daily intake of opioids by one tablet each day Next start to increase the time between doses. The last dose that should be eliminated is the evening dose.      Weight bearing as tolerated   Complete by: As directed    Weight bearing as tolerated   Complete by: As directed         Follow-up Information     Frederik Pear, MD. Go on 06/13/2021.   Specialty: Orthopedic Surgery Why: Your appointment is scheduled fro 9:30 Contact information: [place] [address] [phone]         [facility], [place] Follow up.   Specialty: [place] Why: You will have 6 HHPT visits provided prior to starting outpatient physical therapy Contact information: [place] [address] [phone]         Rehabilitation, [place]. Go on 06/14/2021.   Why: Your appointment is scheduled for 11:00.  Please arrive a few minutes early to complete paperwork. Contact information: [place] [address] [phone]  Signed: Joanell Rising 06/05/2021, 8:04 AM

## 2021-06-06 ENCOUNTER — Observation Stay (HOSPITAL_COMMUNITY): Payer: Medicare HMO

## 2021-06-06 DIAGNOSIS — I4821 Permanent atrial fibrillation: Secondary | ICD-10-CM | POA: Diagnosis not present

## 2021-06-06 DIAGNOSIS — J80 Acute respiratory distress syndrome: Secondary | ICD-10-CM | POA: Diagnosis not present

## 2021-06-06 DIAGNOSIS — R531 Weakness: Secondary | ICD-10-CM | POA: Diagnosis not present

## 2021-06-06 DIAGNOSIS — I2699 Other pulmonary embolism without acute cor pulmonale: Secondary | ICD-10-CM | POA: Diagnosis not present

## 2021-06-06 DIAGNOSIS — M1612 Unilateral primary osteoarthritis, left hip: Secondary | ICD-10-CM | POA: Diagnosis not present

## 2021-06-06 DIAGNOSIS — J690 Pneumonitis due to inhalation of food and vomit: Secondary | ICD-10-CM | POA: Diagnosis not present

## 2021-06-06 DIAGNOSIS — I959 Hypotension, unspecified: Secondary | ICD-10-CM | POA: Diagnosis not present

## 2021-06-06 DIAGNOSIS — R0902 Hypoxemia: Secondary | ICD-10-CM | POA: Diagnosis not present

## 2021-06-06 DIAGNOSIS — I2602 Saddle embolus of pulmonary artery with acute cor pulmonale: Secondary | ICD-10-CM | POA: Diagnosis not present

## 2021-06-06 DIAGNOSIS — J9811 Atelectasis: Secondary | ICD-10-CM | POA: Diagnosis not present

## 2021-06-06 DIAGNOSIS — I10 Essential (primary) hypertension: Secondary | ICD-10-CM | POA: Diagnosis not present

## 2021-06-06 DIAGNOSIS — Z96642 Presence of left artificial hip joint: Secondary | ICD-10-CM | POA: Diagnosis not present

## 2021-06-06 DIAGNOSIS — M109 Gout, unspecified: Secondary | ICD-10-CM | POA: Diagnosis not present

## 2021-06-06 DIAGNOSIS — R0689 Other abnormalities of breathing: Secondary | ICD-10-CM | POA: Diagnosis not present

## 2021-06-06 DIAGNOSIS — Z7401 Bed confinement status: Secondary | ICD-10-CM | POA: Diagnosis not present

## 2021-06-06 DIAGNOSIS — D539 Nutritional anemia, unspecified: Secondary | ICD-10-CM | POA: Diagnosis not present

## 2021-06-06 DIAGNOSIS — J9601 Acute respiratory failure with hypoxia: Secondary | ICD-10-CM | POA: Diagnosis not present

## 2021-06-06 DIAGNOSIS — J8410 Pulmonary fibrosis, unspecified: Secondary | ICD-10-CM | POA: Diagnosis not present

## 2021-06-06 DIAGNOSIS — J90 Pleural effusion, not elsewhere classified: Secondary | ICD-10-CM | POA: Diagnosis not present

## 2021-06-06 DIAGNOSIS — R0602 Shortness of breath: Secondary | ICD-10-CM | POA: Diagnosis not present

## 2021-06-06 DIAGNOSIS — Z471 Aftercare following joint replacement surgery: Secondary | ICD-10-CM | POA: Diagnosis not present

## 2021-06-06 DIAGNOSIS — R062 Wheezing: Secondary | ICD-10-CM | POA: Diagnosis not present

## 2021-06-06 LAB — CBC
HCT: 27.2 % — ABNORMAL LOW (ref 39.0–52.0)
Hemoglobin: 8.7 g/dL — ABNORMAL LOW (ref 13.0–17.0)
MCH: 33.2 pg (ref 26.0–34.0)
MCHC: 32 g/dL (ref 30.0–36.0)
MCV: 103.8 fL — ABNORMAL HIGH (ref 80.0–100.0)
Platelets: 224 10*3/uL (ref 150–400)
RBC: 2.62 MIL/uL — ABNORMAL LOW (ref 4.22–5.81)
RDW: 15.6 % — ABNORMAL HIGH (ref 11.5–15.5)
WBC: 11.4 10*3/uL — ABNORMAL HIGH (ref 4.0–10.5)
nRBC: 0 % (ref 0.0–0.2)

## 2021-06-06 IMAGING — DX DG HIP (WITH OR WITHOUT PELVIS) 1V*L*
2 series · 2 of 2 positions shown · non-contrast
Comparison: Intraoperative hip radiograph [DATE]

CLINICAL DATA: History of left hip replacement on [DATE]

EXAM:
DG HIP (WITH OR WITHOUT PELVIS) 1V*L*

[pelvis ap]
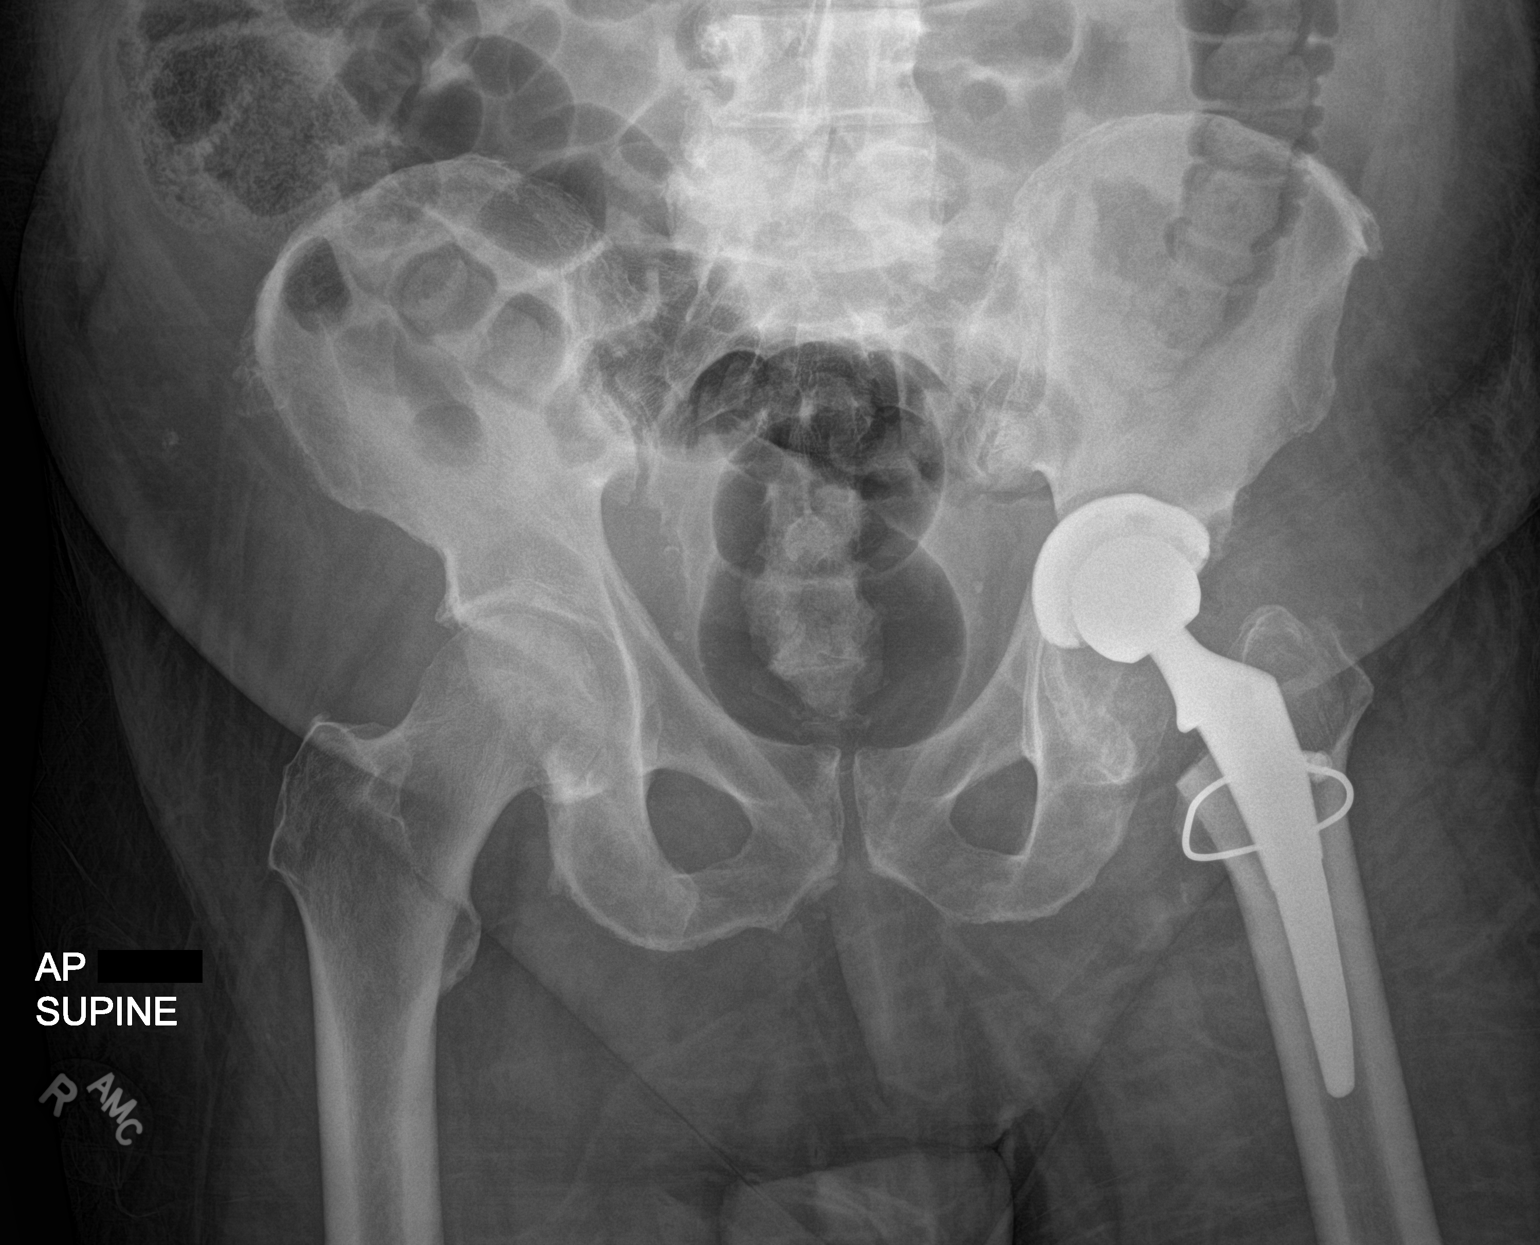

[hip ap]
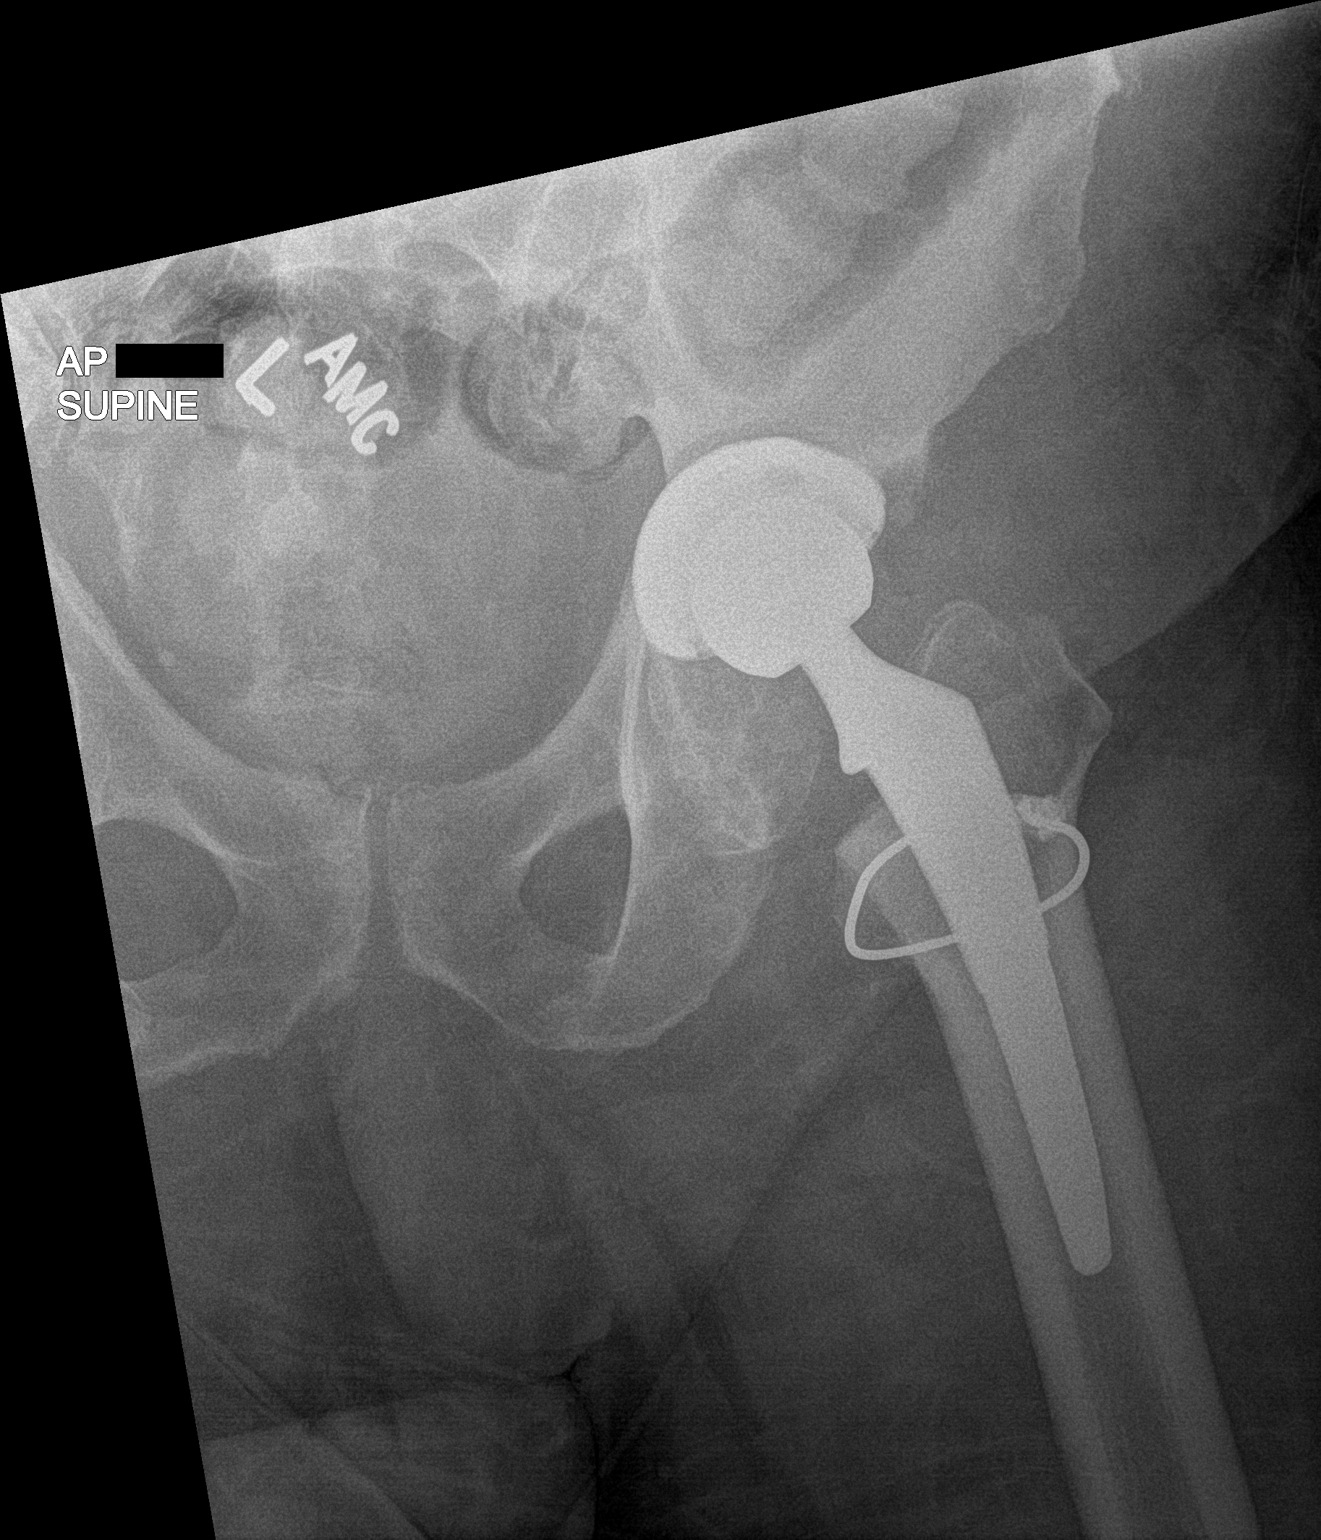

[2 of 2 positions shown; findings below may reference images not displayed]

FINDINGS: Postsurgical changes reflecting left hip arthroplasty are again
seen. Prosthesis is high riding within the acetabulum, similar to
the intraoperative radiographs. There is no evidence of
complication. Mild degenerative change about the right hip is noted.
The SI joints and symphysis pubis are intact.
IMPRESSION: Postsurgical changes reflecting left hip arthroplasty without
evidence of complication.

## 2021-06-06 NOTE — Progress Notes (Signed)
Physical Therapy Treatment Patient Details Name: Logan [name] MRN: [id number] DOB: Apr 08, 1946 Today's Date: 06/06/2021   History of Present Illness 76 yo male s/p L THA on 06/03/21. PMH: HTN, ACDF C3-4, afib, HF, DM, obesity, sleep apnea and chronic pain.    PT Comments    POD # 3 am session Assisted OOB to amb required + 2 assist and use of B Platform EVA walker for a total of 12 feet.   Pt will need ST Rehab at SNF prior to returning home alone.   Recommendations for follow up therapy are one component of a multi-disciplinary discharge planning process, led by the attending physician.  Recommendations may be updated based on patient status, additional functional criteria and insurance authorization.  Follow Up Recommendations  Skilled nursing-short term rehab (<3 hours/day)     Assistance Recommended at Discharge Frequent or constant Supervision/Assistance  Patient can return home with the following Assistance with cooking/housework;Assist for transportation;Help with stairs or ramp for entrance;Two people to help with walking and/or transfers;A lot of help with bathing/dressing/bathroom   Equipment Recommendations  None recommended by PT    Recommendations for Other Services       Precautions / Restrictions Precautions Precautions: Fall Precaution Comments: Rt knee DJD , decreased knee flexion on Rt. to stand up, also varus deformity plus a "bad" R shoulder Restrictions Weight Bearing Restrictions: No Other Position/Activity Restrictions: WBAT     Mobility  Bed Mobility Overal bed mobility: Needs Assistance Bed Mobility: Supine to Sit     Supine to sit: Max assist, +2 for physical assistance, +2 for safety/equipment     General bed mobility comments: 50% VC's on proper hand placeemnt using B plaftorm EVA walker for increased support.    Transfers Overall transfer level: Needs assistance Equipment used: Rolling walker (2 wheels) Transfers: Sit to/from Stand Sit  to Stand: Max assist, +2 physical assistance, +2 safety/equipment Stand pivot transfers: +2 safety/equipment, +2 physical assistance         General transfer comment: Pt  required + 2 Max Assist    Ambulation/Gait Ambulation/Gait assistance: Max assist, Total assist, +2 physical assistance, +2 safety/equipment Gait Distance (Feet): 12 Feet Assistive device: Bilateral platform walker Gait Pattern/deviations: Step-to pattern, Decreased step length - left, Decreased step length - right Gait velocity: decreased     General Gait Details: using B platform EVA walker for increased support.   Stairs             Wheelchair Mobility    Modified Rankin (Stroke Patients Only)       Balance                                            Cognition Arousal/Alertness: Awake/alert Behavior During Therapy: WFL for tasks assessed/performed Overall Cognitive Status: Within Functional Limits for tasks assessed                                 General Comments: Pt is AxO x 3 very willing but present with multiple "other" joint problems.  Pt stated he spent 2 months at Rehab after cervical fusion.  Pt        Exercises  Total Hip Replacement TE's following HEP Handout 10 reps ankle pumps 05 reps knee presses 05 reps heel slides 05 reps SAQ's 05 reps ABD Instructed how  to use a belt loop to assist  Followed by [facility]     General Comments        Pertinent Vitals/Pain Pain Assessment Pain Assessment: 0-10 Pain Score: 6  Pain Location: L hip and rt shoulder Pain Descriptors / Indicators: Discomfort, Aching, Grimacing Pain Intervention(s): Monitored during session, Premedicated before session, Repositioned, Ice applied    Home Living                          Prior Function            PT Goals (current goals can now be found in the care plan section) Progress towards PT goals: Progressing toward goals    Frequency     7X/week      PT Plan Current plan remains appropriate    Co-evaluation              AM-PAC PT "6 Clicks" Mobility   Outcome Measure  Help needed turning from your back to your side while in a flat bed without using bedrails?: A Lot Help needed moving from lying on your back to sitting on the side of a flat bed without using bedrails?: A Lot Help needed moving to and from a bed to a chair (including a wheelchair)?: A Lot Help needed standing up from a chair using your arms (e.g., wheelchair or bedside chair)?: A Lot Help needed to walk in hospital room?: A Lot Help needed climbing 3-5 steps with a railing? : Total 6 Click Score: 11    End of Session Equipment Utilized During Treatment: Gait belt Activity Tolerance: Patient tolerated treatment well Patient left: in chair;with call bell/phone within reach Nurse Communication: Mobility status PT Visit Diagnosis: Other abnormalities of gait and mobility (R26.89);Muscle weakness (generalized) (M62.81);Difficulty in walking, not elsewhere classified (R26.2);History of falling (Z91.81)     Time: 5997-7414 PT Time Calculation (min) (ACUTE ONLY): 34 min  Charges:  $Gait Training: 8-22 mins $Therapeutic Activity: 8-22 mins                     {Caelen Higinbotham  PTA Acute  Rehabilitation Services Pager      [phone] Office      [phone]

## 2021-06-06 NOTE — Progress Notes (Signed)
PATIENT ID: Logan [name]  MRN: [id number]  DOB/AGE:  21-Feb-1946 / 76 y.o.  3 Days Post-Op Procedure(s) (LRB): LEFT TOTAL HIP ARTHROPLASTY ANTERIOR APPROACH (Left)    PROGRESS NOTE Subjective: Patient is alert, oriented, no Nausea, no Vomiting, yes passing gas, . Taking PO well. Denies SOB, Chest or Calf Pain. Using Incentive Spirometer, PAS in place. Ambulate WBAT with pt walking 8 ft with therapy Patient reports pain as mild .    Objective: Vital signs in last 24 hours: Vitals:   06/05/21 0618 06/05/21 1310 06/05/21 2202 06/06/21 0611  BP: 121/64 93/63 (!) 98/52 (!) 94/52  Pulse: 70 71 72 66  Resp: 17 18 17 18   Temp: 97.9 F (36.6 C) 98.3 F (36.8 C) 98.2 F (36.8 C) 97.8 F (36.6 C)  TempSrc: Oral Oral Oral Oral  SpO2: 95% 93% 98% 100%  Weight:      Height:          Intake/Output from previous day: I/O last 3 completed shifts: In: 360 [P.O.:360] Out: 1575 [Urine:1575]   Intake/Output this shift: No intake/output data recorded.   LABORATORY DATA: Recent Labs    06/04/21 0328 06/05/21 0331 06/05/21 1410 06/06/21 0319  WBC 12.0* 12.6*  --  11.4*  HGB 9.2* 9.0*  --  8.7*  HCT 28.7* 27.9*  --  27.2*  PLT 223 218  --  224  NA 136  --   --   --   K 3.3*  --   --   --   CL 100  --   --   --   CO2 32  --   --   --   BUN 10  --   --   --   CREATININE 0.67  --   --   --   GLUCOSE 122*  --   --   --   GLUCAP  --   --  131*  --   CALCIUM 8.5*  --   --   --     Examination: Neurologically intact Neurovascular intact Sensation intact distally Intact pulses distally Dorsiflexion/Plantar flexion intact Incision: dressing C/D/I and no drainage No cellulitis present Compartment soft} XR AP&Lat of hip shows well placed\fixed THA  Assessment:   3 Days Post-Op Procedure(s) (LRB): LEFT TOTAL HIP ARTHROPLASTY ANTERIOR APPROACH (Left) ADDITIONAL DIAGNOSIS:  Expected Acute Blood Loss Anemia, Diabetes, Cardiac Arrythmia afib, and Sleep Apnea Anticipated LOS equal to or  greater than 2 midnights due to - Age 74 and older with one or more of the following:  - Obesity  - Expected need for hospital services (PT, OT, Nursing) required for safe  discharge  - Anticipated need for postoperative skilled nursing care or inpatient rehab  - Active co-morbidities: Chronic pain requiring opiods, Diabetes, and Cardiac Arrhythmia OR   - Unanticipated findings during/Post Surgery: Slow post-op progression: GI, pain control, mobility    Plan: PT/OT WBAT, THA  DVT Prophylaxis: SCDx72 hrs, Eliquis 2.5 mg BID x 2 weeks  DISCHARGE PLAN: Skilled Nursing Facility/Rehab  DISCHARGE NEEDS: HHPT, Walker, and 3-in-1 comode seat

## 2021-06-06 NOTE — TOC Transition Note (Signed)
Transition of Care [facility]) - CM/SW Discharge Note   Patient Details  Name: Logan [name] MRN: [id number] Date of Birth: August 04, 1945  [facility] CM/SW Contact:  Lennart Pall, LCSW Phone Number: 06/06/2021, 11:16 AM   Clinical Narrative:     Have received insurance auth for SNF today and pt has accepted bed at [facility]  Pt and son aware PTAR called at 11:15 am.  RN to call report to [phone]. No further TOC needs.  Final next level of care: Skilled Nursing Facility Barriers to Discharge: Barriers Resolved   Patient Goals and CMS Choice Patient states their goals for this hospitalization and ongoing recovery are:: return home      Discharge Placement PASRR number recieved: 06/04/21            Patient chooses bed at: [facility] Patient to be transferred to facility by: [place] Name of family member notified: son Patient and family notified of of transfer: 06/06/21  Discharge Plan and Services                DME Arranged: N/A DME Agency: NA       HH Arranged: PT [place] Agency: [facility]        Social Determinants of Health (SDOH) Interventions     Readmission Risk Interventions Readmission Risk Prevention Plan 01/03/2021  Post Dischage Appt Not Complete  Appt Comments going to INPT rehab  Medication Screening Complete  Transportation Screening Complete  Some recent data might be hidden

## 2021-06-06 NOTE — Progress Notes (Signed)
Patient discharged to [facility] via [place]. All belongings w/ patient, son present at time of discharge. Report called to Quillian Quince at facility.

## 2021-06-07 ENCOUNTER — Emergency Department (HOSPITAL_COMMUNITY): Payer: Medicare HMO

## 2021-06-07 ENCOUNTER — Other Ambulatory Visit: Payer: Self-pay

## 2021-06-07 ENCOUNTER — Inpatient Hospital Stay (HOSPITAL_COMMUNITY)
Admission: EM | Admit: 2021-06-07 | Discharge: 2021-06-13 | DRG: 299 | Disposition: A | Discharge status: Skilled Nursing Facility | Payer: Medicare HMO | Source: Skilled Nursing Facility | Attending: Family Medicine | Admitting: Family Medicine

## 2021-06-07 ENCOUNTER — Encounter (HOSPITAL_COMMUNITY): Payer: Self-pay

## 2021-06-07 DIAGNOSIS — M109 Gout, unspecified: Secondary | ICD-10-CM | POA: Diagnosis present

## 2021-06-07 DIAGNOSIS — Z96642 Presence of left artificial hip joint: Secondary | ICD-10-CM

## 2021-06-07 DIAGNOSIS — S2249XA Multiple fractures of ribs, unspecified side, initial encounter for closed fracture: Secondary | ICD-10-CM

## 2021-06-07 DIAGNOSIS — D62 Acute posthemorrhagic anemia: Secondary | ICD-10-CM | POA: Diagnosis present

## 2021-06-07 DIAGNOSIS — I701 Atherosclerosis of renal artery: Secondary | ICD-10-CM | POA: Diagnosis present

## 2021-06-07 DIAGNOSIS — Z79899 Other long term (current) drug therapy: Secondary | ICD-10-CM

## 2021-06-07 DIAGNOSIS — I4821 Permanent atrial fibrillation: Secondary | ICD-10-CM | POA: Diagnosis not present

## 2021-06-07 DIAGNOSIS — Z20822 Contact with and (suspected) exposure to covid-19: Secondary | ICD-10-CM | POA: Diagnosis present

## 2021-06-07 DIAGNOSIS — I110 Hypertensive heart disease with heart failure: Secondary | ICD-10-CM | POA: Diagnosis present

## 2021-06-07 DIAGNOSIS — D539 Nutritional anemia, unspecified: Secondary | ICD-10-CM

## 2021-06-07 DIAGNOSIS — Z7901 Long term (current) use of anticoagulants: Secondary | ICD-10-CM

## 2021-06-07 DIAGNOSIS — Z808 Family history of malignant neoplasm of other organs or systems: Secondary | ICD-10-CM

## 2021-06-07 DIAGNOSIS — J690 Pneumonitis due to inhalation of food and vomit: Secondary | ICD-10-CM

## 2021-06-07 DIAGNOSIS — I2699 Other pulmonary embolism without acute cor pulmonale: Secondary | ICD-10-CM

## 2021-06-07 DIAGNOSIS — Z8249 Family history of ischemic heart disease and other diseases of the circulatory system: Secondary | ICD-10-CM

## 2021-06-07 DIAGNOSIS — I4891 Unspecified atrial fibrillation: Secondary | ICD-10-CM | POA: Diagnosis present

## 2021-06-07 DIAGNOSIS — R0902 Hypoxemia: Secondary | ICD-10-CM

## 2021-06-07 DIAGNOSIS — I10 Essential (primary) hypertension: Secondary | ICD-10-CM | POA: Diagnosis present

## 2021-06-07 DIAGNOSIS — Z87891 Personal history of nicotine dependence: Secondary | ICD-10-CM

## 2021-06-07 DIAGNOSIS — E119 Type 2 diabetes mellitus without complications: Secondary | ICD-10-CM | POA: Diagnosis present

## 2021-06-07 DIAGNOSIS — Y831 Surgical operation with implant of artificial internal device as the cause of abnormal reaction of the patient, or of later complication, without mention of misadventure at the time of the procedure: Secondary | ICD-10-CM | POA: Diagnosis present

## 2021-06-07 DIAGNOSIS — M1612 Unilateral primary osteoarthritis, left hip: Secondary | ICD-10-CM | POA: Diagnosis present

## 2021-06-07 DIAGNOSIS — Z86711 Personal history of pulmonary embolism: Secondary | ICD-10-CM

## 2021-06-07 DIAGNOSIS — R0602 Shortness of breath: Secondary | ICD-10-CM

## 2021-06-07 DIAGNOSIS — K219 Gastro-esophageal reflux disease without esophagitis: Secondary | ICD-10-CM | POA: Diagnosis present

## 2021-06-07 DIAGNOSIS — Z981 Arthrodesis status: Secondary | ICD-10-CM

## 2021-06-07 DIAGNOSIS — G4730 Sleep apnea, unspecified: Secondary | ICD-10-CM | POA: Diagnosis present

## 2021-06-07 DIAGNOSIS — J9601 Acute respiratory failure with hypoxia: Secondary | ICD-10-CM | POA: Diagnosis not present

## 2021-06-07 DIAGNOSIS — S42133A Displaced fracture of coracoid process, unspecified shoulder, initial encounter for closed fracture: Secondary | ICD-10-CM

## 2021-06-07 DIAGNOSIS — T81718A Complication of other artery following a procedure, not elsewhere classified, initial encounter: Principal | ICD-10-CM | POA: Diagnosis present

## 2021-06-07 DIAGNOSIS — Z86718 Personal history of other venous thrombosis and embolism: Secondary | ICD-10-CM

## 2021-06-07 DIAGNOSIS — I2694 Multiple subsegmental pulmonary emboli without acute cor pulmonale: Secondary | ICD-10-CM | POA: Diagnosis present

## 2021-06-07 DIAGNOSIS — S42131A Displaced fracture of coracoid process, right shoulder, initial encounter for closed fracture: Secondary | ICD-10-CM | POA: Diagnosis present

## 2021-06-07 DIAGNOSIS — X58XXXA Exposure to other specified factors, initial encounter: Secondary | ICD-10-CM | POA: Diagnosis present

## 2021-06-07 DIAGNOSIS — I959 Hypotension, unspecified: Secondary | ICD-10-CM | POA: Diagnosis not present

## 2021-06-07 DIAGNOSIS — I4819 Other persistent atrial fibrillation: Secondary | ICD-10-CM | POA: Diagnosis present

## 2021-06-07 DIAGNOSIS — I5032 Chronic diastolic (congestive) heart failure: Secondary | ICD-10-CM | POA: Diagnosis present

## 2021-06-07 HISTORY — DX: Other pulmonary embolism without acute cor pulmonale: I26.99

## 2021-06-07 LAB — CBC WITH DIFFERENTIAL/PLATELET
Abs Immature Granulocytes: 0.08 10*3/uL — ABNORMAL HIGH (ref 0.00–0.07)
Basophils Absolute: 0 10*3/uL (ref 0.0–0.1)
Basophils Relative: 0 %
Eosinophils Absolute: 0.3 10*3/uL (ref 0.0–0.5)
Eosinophils Relative: 2 %
HCT: 30.8 % — ABNORMAL LOW (ref 39.0–52.0)
Hemoglobin: 9.7 g/dL — ABNORMAL LOW (ref 13.0–17.0)
Immature Granulocytes: 1 %
Lymphocytes Relative: 16 %
Lymphs Abs: 1.8 10*3/uL (ref 0.7–4.0)
MCH: 32.6 pg (ref 26.0–34.0)
MCHC: 31.5 g/dL (ref 30.0–36.0)
MCV: 103.4 fL — ABNORMAL HIGH (ref 80.0–100.0)
Monocytes Absolute: 0.8 10*3/uL (ref 0.1–1.0)
Monocytes Relative: 7 %
Neutro Abs: 8.3 10*3/uL — ABNORMAL HIGH (ref 1.7–7.7)
Neutrophils Relative %: 74 %
Platelets: 281 10*3/uL (ref 150–400)
RBC: 2.98 MIL/uL — ABNORMAL LOW (ref 4.22–5.81)
RDW: 15.2 % (ref 11.5–15.5)
WBC: 11.3 10*3/uL — ABNORMAL HIGH (ref 4.0–10.5)
nRBC: 0.2 % (ref 0.0–0.2)

## 2021-06-07 LAB — EXPECTORATED SPUTUM ASSESSMENT W GRAM STAIN, RFLX TO RESP C

## 2021-06-07 LAB — RESP PANEL BY RT-PCR (FLU A&B, COVID) ARPGX2
Influenza A by PCR: NEGATIVE
Influenza B by PCR: NEGATIVE
SARS Coronavirus 2 by RT PCR: NEGATIVE

## 2021-06-07 LAB — COMPREHENSIVE METABOLIC PANEL
ALT: 19 U/L (ref 0–44)
AST: 22 U/L (ref 15–41)
Albumin: 2.8 g/dL — ABNORMAL LOW (ref 3.5–5.0)
Alkaline Phosphatase: 55 U/L (ref 38–126)
Anion gap: 5 (ref 5–15)
BUN: 17 mg/dL (ref 8–23)
CO2: 33 mmol/L — ABNORMAL HIGH (ref 22–32)
Calcium: 8.8 mg/dL — ABNORMAL LOW (ref 8.9–10.3)
Chloride: 100 mmol/L (ref 98–111)
Creatinine, Ser: 0.62 mg/dL (ref 0.61–1.24)
GFR, Estimated: >60 mL/min (ref >60)
Glucose, Bld: 93 mg/dL (ref 70–99)
Potassium: 3.8 mmol/L (ref 3.5–5.1)
Sodium: 138 mmol/L (ref 135–145)
Total Bilirubin: 1.3 mg/dL — ABNORMAL HIGH (ref 0.3–1.2)
Total Protein: 5.8 g/dL — ABNORMAL LOW (ref 6.5–8.1)

## 2021-06-07 LAB — HEPARIN LEVEL (UNFRACTIONATED)
Heparin Unfractionated: 1.09 IU/mL — ABNORMAL HIGH (ref 0.30–0.70)
Heparin Unfractionated: >1.1 IU/mL — ABNORMAL HIGH (ref 0.30–0.70)

## 2021-06-07 LAB — LACTIC ACID, PLASMA
Lactic Acid, Venous: 1.1 mmol/L (ref 0.5–1.9)
Lactic Acid, Venous: 1.1 mmol/L (ref 0.5–1.9)

## 2021-06-07 LAB — BRAIN NATRIURETIC PEPTIDE: B Natriuretic Peptide: 227 pg/mL — ABNORMAL HIGH (ref 0.0–100.0)

## 2021-06-07 LAB — TROPONIN I (HIGH SENSITIVITY)
Troponin I (High Sensitivity): 6 ng/L (ref <18)
Troponin I (High Sensitivity): 5 ng/L (ref <18)

## 2021-06-07 LAB — LIPASE, BLOOD: Lipase: 24 U/L (ref 11–51)

## 2021-06-07 LAB — APTT: aPTT: 34 seconds (ref 24–36)

## 2021-06-07 IMAGING — CT CT ANGIO CHEST
3 of 7 series · 17 of 36 positions shown · IV contrast (OMNIPAQUE 350)
Comparison: Radiograph [DATE], right shoulder CT [DATE]

CLINICAL DATA: Pulmonary embolism (PE) suspected, high prob

EXAM:
CT ANGIOGRAPHY CHEST WITH CONTRAST
TECHNIQUE: Multidetector CT imaging of the chest was performed using the
standard protocol during bolus administration of intravenous
contrast. Multiplanar CT image reconstructions and MIPs were
obtained to evaluate the vascular anatomy.

[Series 5: thins · axial · 0.76mm/px · z∈[-277,-52]mm · 12 of 267 slices shown]
[im 21/267  lung]
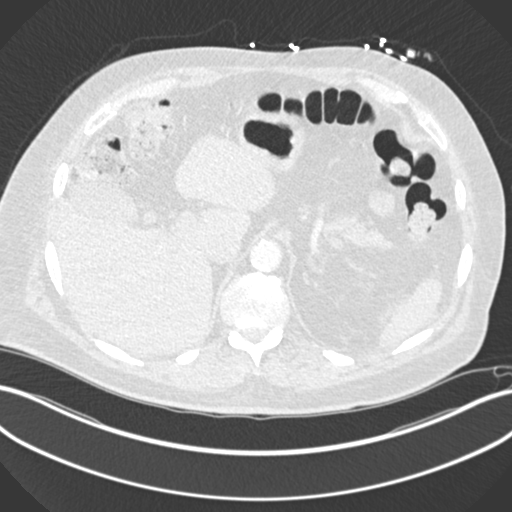
[im 41/267  mediastinal]
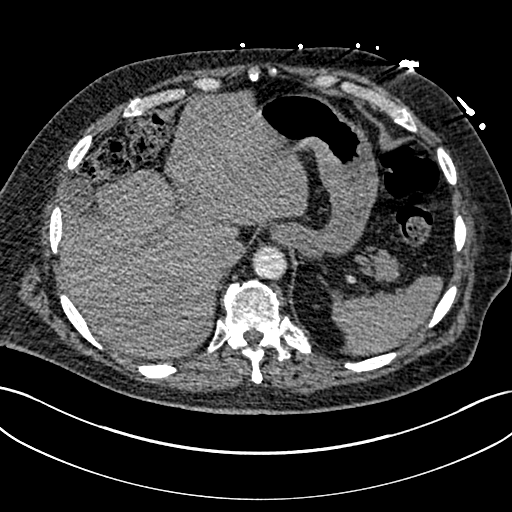
[im 62/267  lung]
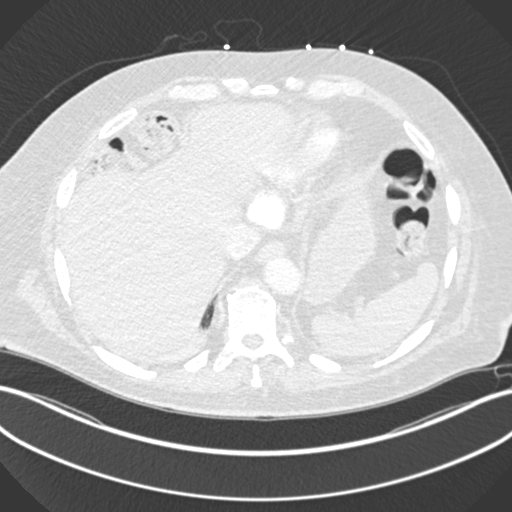
[im 82/267  mediastinal]
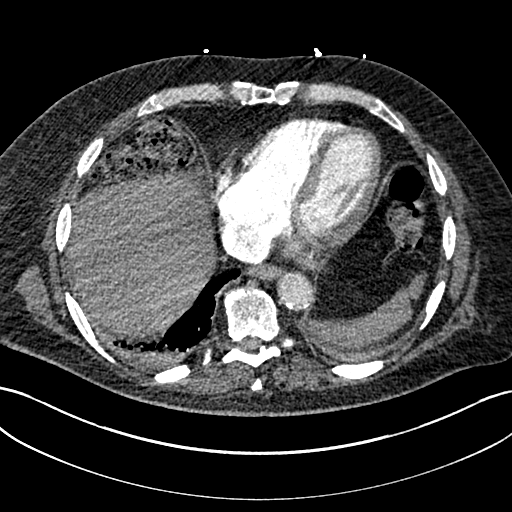
[im 103/267  lung]
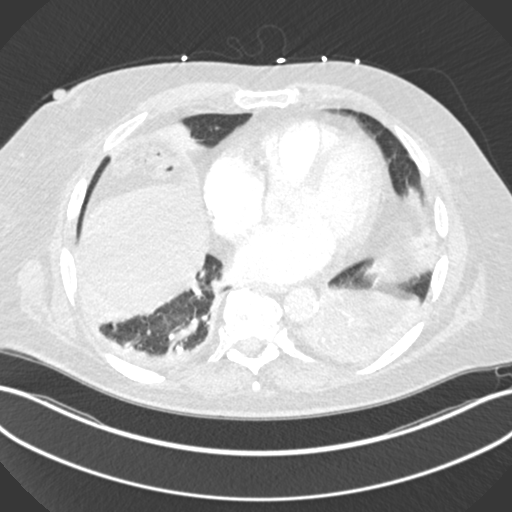
[im 123/267  mediastinal]
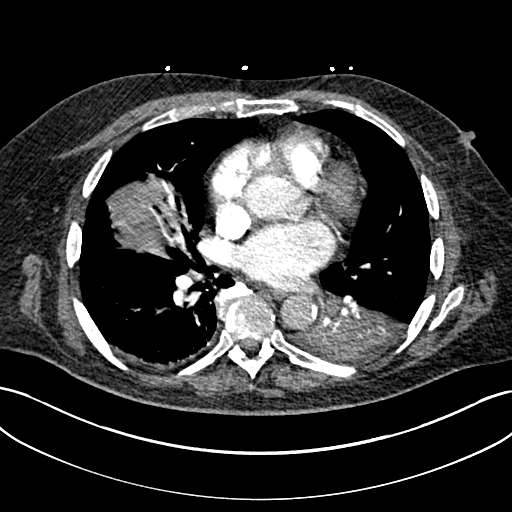
[im 144/267  lung]
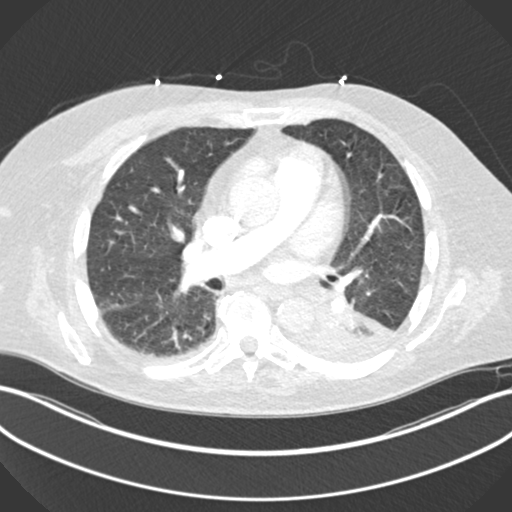
[im 164/267  mediastinal]
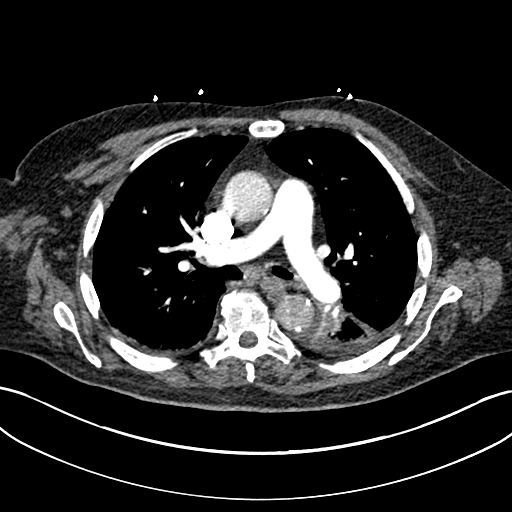
[im 185/267  lung]
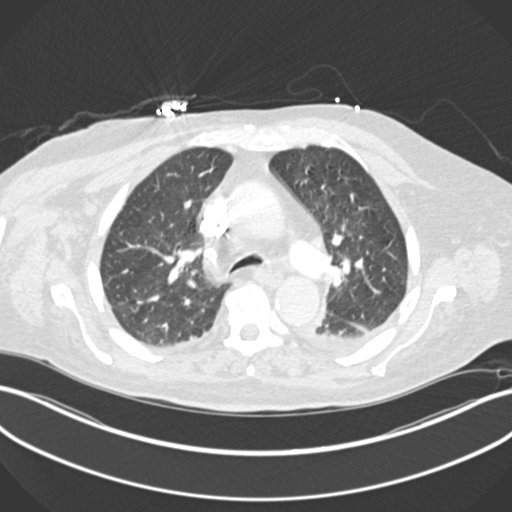
[im 205/267  mediastinal]
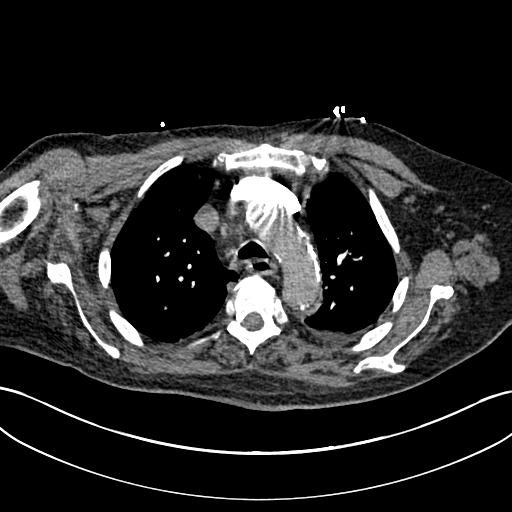
[im 226/267  lung]
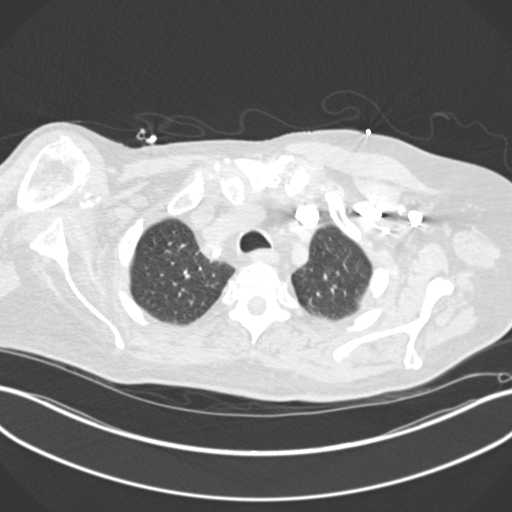
[im 246/267  mediastinal]
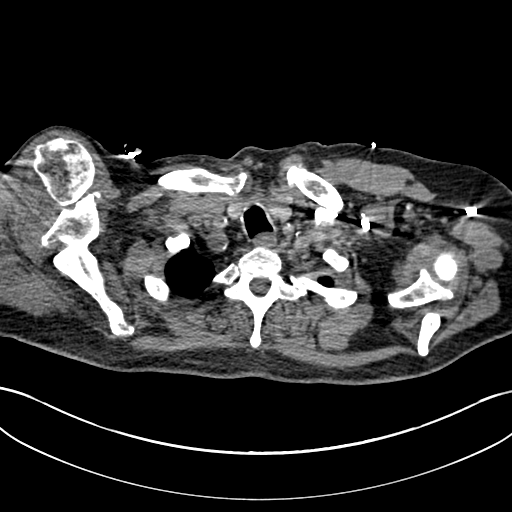

[Series 6: lung · axial · 0.76mm/px · z∈[-225,-87]mm · 4 of 116 slices shown]
[im 24/116  mediastinal]
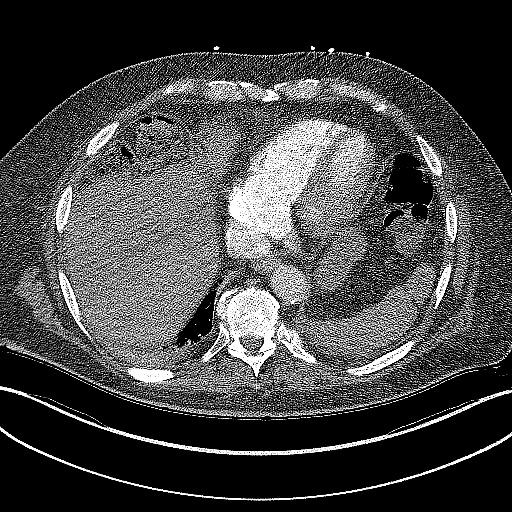
[im 47/116  mediastinal]
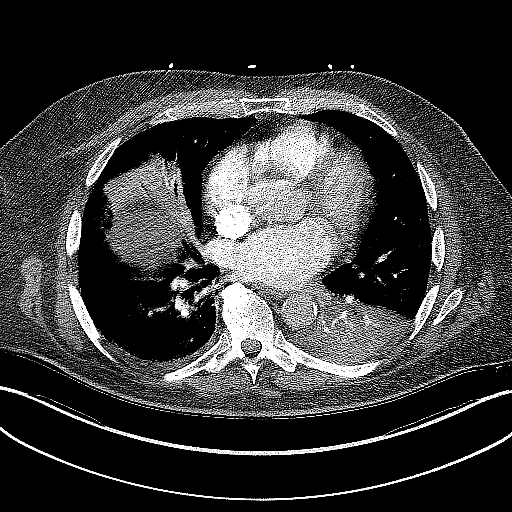
[im 70/116  mediastinal]
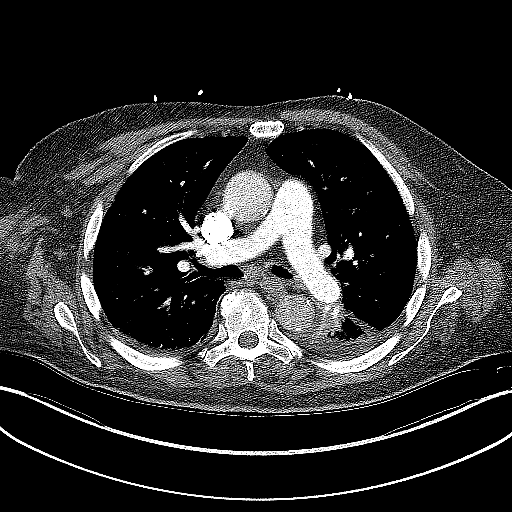
[im 93/116  mediastinal]
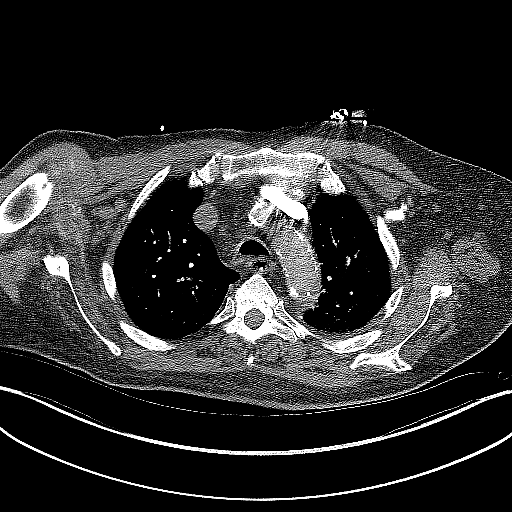

[Series 7: coronal mpr · coronal · 0.66mm/px · 1 of 127 slices shown]
[im 64/127  mediastinal]
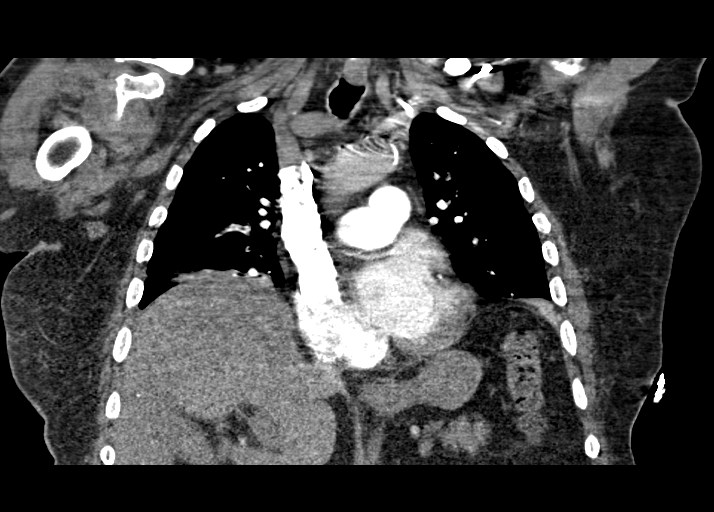

[17 of 36 positions shown; findings below may reference images not displayed]

RADIATION DOSE REDUCTION: This exam was performed according to the
departmental dose-optimization program which includes automated
exposure control, adjustment of the mA and/or kV according to
patient size and/or use of iterative reconstruction technique.

CONTRAST:  100mL OMNIPAQUE IOHEXOL 350 MG/ML SOLN
FINDINGS: Cardiovascular: Mild cardiomegaly. No pericardial disease. Coronary
artery calcifications. Normal size main and branch pulmonary
arteries. There is a right upper lobe subsegmental pulmonary
embolism and a left upper lobe segmental and subsegmental pulmonary
embolism (series 5, images 100 and 110-111). No central/saddle
embolism. No evidence of right heart strain (RV: LV ratio is 0.8).
There is moderate atherosclerotic calcifications of the aortic arch
and descending aorta.

Mediastinum/Nodes: No lymphadenopathy.The thyroid is unremarkable.
The esophagus is unremarkable.

Lungs/Pleura: There is endobronchial material within the left
mainstem and lower lobe bronchus, with near complete left lower lobe
collapse and areas of decreased density within the collapsed left
lower lobe consistent with pneumonia. Trace respiratory secretions
in the trachea.Right basilar atelectasis. Lingular subsegmental
atelectasis. Small left and trace right pleural effusions. No
pneumothorax.

Upper Abdomen: Calcified hepatic granulomas.

Musculoskeletal: There are subacute left posterior ninth, tenth, and
eleventh rib fractures near the costovertebral junction.Anterior
superior right glenohumeral dislocation and fragmented fracture of
the coracoid. Dislocation was present on prior CT in [DATE],
but the fracture of the coracoid is new since that exam. There is
severe left and mild-to-moderate right SC joint arthritis with
slight posterior subluxation of right medial clavicle with respect
to the manubrium.

Review of the MIP images confirms the above findings.
IMPRESSION: Right upper lobe subsegmental and left upper lobe segmental and
subsegmental pulmonary emboli. No central/saddle embolism. No CT
evidence of right heart strain.

Endobronchial material within the left mainstem and left lower lobe
bronchus, with near complete left lower lobe collapse and findings
suggestive of left lower lobe aspiration pneumonia. Small left and
trace right pleural effusions. Right basilar and lingular
atelectasis.

Subacute left posterior ninth, tenth, and eleventh rib fractures
near the costovertebral junction.

Anterior superior right glenohumeral dislocation with fragmented
fracture of the coracoid. The right shoulder was dislocated on prior
CT in [DATE], but the coracoid fracture is new.

Aortic Atherosclerosis ([VL]-[VL]).

Critical Value/emergent results were called by telephone at the time
of the patient, who verbally acknowledged these results.

## 2021-06-07 IMAGING — DX DG CHEST 1V PORT
1 series · 1 of 1 positions shown · non-contrast
Comparison: [DATE].

CLINICAL DATA: Hypoxia, shortness of breath.

EXAM:
PORTABLE CHEST 1 VIEW

[chest ap]
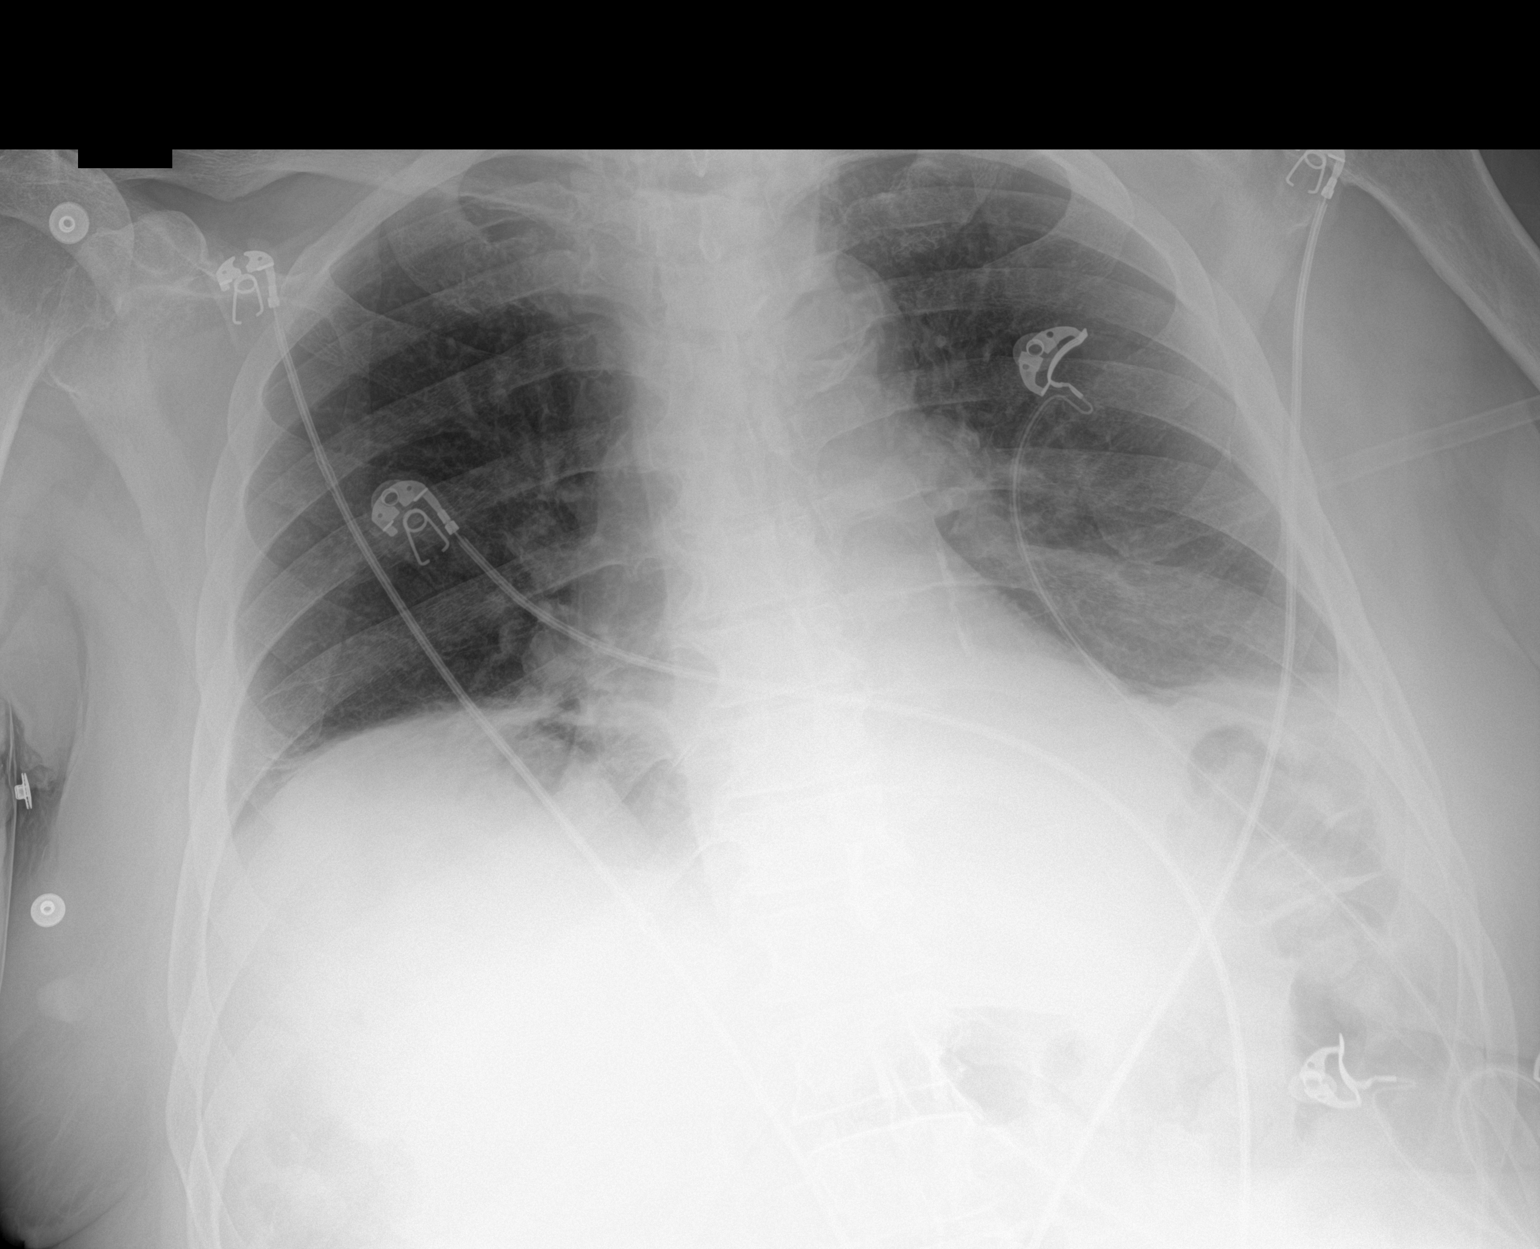

[1 of 1 positions shown; findings below may reference images not displayed]

FINDINGS: The heart size and mediastinal contours are within normal limits.
Hypoinflation of the lungs is noted with mild bibasilar subsegmental
atelectasis. The visualized skeletal structures are unremarkable.
IMPRESSION: Hypoinflation of the lungs with mild bibasilar subsegmental
atelectasis.

## 2021-06-07 MED ORDER — APIXABAN 5 MG PO TABS: 5.0000 mg | ORAL_TABLET | Freq: Two times a day (BID) | ORAL | Status: DC

## 2021-06-07 MED ORDER — OXYCODONE-ACETAMINOPHEN 5-325 MG PO TABS
1.0000 | ORAL_TABLET | ORAL | Status: DC | PRN
  Administered 2021-06-07 – 2021-06-13 (×14): 1 via ORAL
  Filled 2021-06-07 (×14): qty 1

## 2021-06-07 MED ORDER — HEPARIN (PORCINE) 25000 UT/250ML-% IV SOLN
1350.0000 [IU]/h | INTRAVENOUS | Status: AC
  Administered 2021-06-07: 12:00:00 1200 [IU]/h via INTRAVENOUS
  Filled 2021-06-07 (×2): qty 250

## 2021-06-07 MED ORDER — ONDANSETRON HCL 4 MG PO TABS: 4.0000 mg | ORAL_TABLET | Freq: Four times a day (QID) | ORAL | Status: DC | PRN

## 2021-06-07 MED ORDER — IOHEXOL 350 MG/ML SOLN
100.0000 mL | Freq: Once | INTRAVENOUS | Status: AC | PRN
  Administered 2021-06-07: 100 mL via INTRAVENOUS

## 2021-06-07 MED ORDER — ONDANSETRON HCL 4 MG/2ML IJ SOLN: 4.0000 mg | Freq: Four times a day (QID) | INTRAMUSCULAR | Status: DC | PRN

## 2021-06-07 MED ORDER — APIXABAN 5 MG PO TABS
10.0000 mg | ORAL_TABLET | Freq: Two times a day (BID) | ORAL | Status: DC
  Administered 2021-06-08 – 2021-06-13 (×11): 10 mg via ORAL
  Filled 2021-06-07 (×11): qty 2

## 2021-06-07 MED ORDER — SODIUM CHLORIDE (PF) 0.9 % IJ SOLN
INTRAMUSCULAR | Status: AC
  Filled 2021-06-07: qty 50

## 2021-06-07 MED ORDER — HYDRALAZINE HCL 25 MG PO TABS: 25.0000 mg | ORAL_TABLET | Freq: Four times a day (QID) | ORAL | Status: DC | PRN

## 2021-06-07 MED ORDER — SODIUM CHLORIDE 0.9 % IV SOLN
3.0000 g | Freq: Four times a day (QID) | INTRAVENOUS | Status: AC
  Administered 2021-06-07 – 2021-06-11 (×16): 3 g via INTRAVENOUS
  Filled 2021-06-07 (×17): qty 8

## 2021-06-07 MED ORDER — CARVEDILOL 12.5 MG PO TABS
12.5000 mg | ORAL_TABLET | Freq: Two times a day (BID) | ORAL | Status: DC
  Administered 2021-06-07 – 2021-06-12 (×11): 12.5 mg via ORAL
  Filled 2021-06-07 (×12): qty 1

## 2021-06-07 MED ORDER — PIPERACILLIN-TAZOBACTAM 3.375 G IVPB 30 MIN
3.3750 g | Freq: Once | INTRAVENOUS | Status: AC
  Administered 2021-06-07: 3.375 g via INTRAVENOUS
  Filled 2021-06-07: qty 50

## 2021-06-07 MED ORDER — FENTANYL CITRATE PF 50 MCG/ML IJ SOSY
50.0000 ug | PREFILLED_SYRINGE | Freq: Once | INTRAMUSCULAR | Status: AC
  Administered 2021-06-07: 50 ug via INTRAVENOUS
  Filled 2021-06-07: qty 1

## 2021-06-07 NOTE — Progress Notes (Signed)
ANTICOAGULATION CONSULT NOTE - Initial Consult  Pharmacy Consult for IV Heparin (apixaban PTA) > Eliquis Indication: new B PE, hx DVT  No Known Allergies  Patient Measurements: Height: 5\' 7"  (170.2 cm) Weight: 72.1 kg (159 lb) IBW/kg (Calculated) : 66.1 Heparin Dosing Weight: 72 kg  Vital Signs: Temp: 98.3 F (36.8 C) (02/17 0827) Temp Source: Oral (02/17 0827) BP: 141/118 (02/17 1739) Pulse Rate: 80 (02/17 1739)  Labs: Recent Labs    06/05/21 0331 06/06/21 0319 06/07/21 0824 06/07/21 1024 06/07/21 1120  HGB 9.0* 8.7* 9.7*  --   --   HCT 27.9* 27.2* 30.8*  --   --   PLT 218 224 281  --   --   APTT  --   --   --   --  34  HEPARINUNFRC  --   --   --   --  1.09*  CREATININE  --   --  0.62  --   --   TROPONINIHS  --   --  6 5  --      Estimated Creatinine Clearance: 76.9 mL/min (by C-G formula based on SCr of 0.62 mg/dL).   Medical History: Past Medical History:  Diagnosis Date   Anxiety    Atherosclerotic renal artery stenosis, unilateral (HCC) 02/28/2014   Atrial fibrillation (HCC)    Bilateral lower extremity edema    Chronic diastolic heart failure ([place]) 10/21/2019   Diabetes mellitus without complication (HCC)    DVT (deep venous thrombosis) (HCC)    Dysrhythmia    Esophageal reflux    Essential hypertension 10/21/2019   Gout    Hyperlipidemia    Hypertension    Mixed hyperlipidemia 10/21/2019   Morbid obesity (HCC)    Obesity (BMI 30-39.9) 08/24/2019   Orthopnea    Osteoarthritis    Persistent atrial fibrillation ([place]) 10/21/2019   Pneumonia    Seasonal allergies    Sleep apnea     Medications:  Scheduled:  Infusions:  PRN:   Assessment: 76 yo male presented with shortness of breath found to have new bilateral PE with no heart strain. Patient was on apixaban PTA for hx DVT and patient just had R THA on 2/13. Patient was informed by cardiology to hold Eliquis starting 3 days prior to ortho surgery which would have been 2/10. POD1, patient was  started on 2.5mg  BID and that was continued until discharge on 2/16. Patient then came back [facility] ER on 2/17 AM with shortness of breath. After discussion with ER Md and [facility], plan is to start IV heparin for time being while final plan is formulated. Baseline CBC drawn, Nurse said she was going to draw a heparin level and aPTT as well.   PM UPDATE: - MD d/w Heme and new bilateral PE is not thought to be due to Eliquis failure since patient was discharged with reduced dose of Eliquis post-op - Pharmacy has been consulted to switch heparin drip to Eliquis load and maintenance dosing starting 2/18 - d/w MD, he would like to continue IV heparin through tonight since patient has been hypoxic  Goal of Therapy:  Heparin level 0.3-0.7 units/ml aPTT 66-102 seconds Monitor platelets by anticoagulation protocol: Yes   Plan:  Continue IV heparin at rate of 1200 units/hr F/u heparin level and APTT 8 hours after start of IV heparin  Daily CBC Start Eliquis 10mg  PO BID x 7 days on 2/18, followed by Eliquis 5mg  PO BID thereafter  Discontinue heparin drip on 2/18 at time  of Eliquis administration  Dimple Nanas, PharmD 06/07/2021 6:01 PM

## 2021-06-07 NOTE — Progress Notes (Signed)
ANTICOAGULATION CONSULT NOTE - Initial Consult  Pharmacy Consult for IV Heparin (apixaban PTA) Indication: new B PE, hx DVT  No Known Allergies  Patient Measurements: Height: 5\' 7"  (170.2 cm) Weight: 72.1 kg (159 lb) IBW/kg (Calculated) : 66.1 Heparin Dosing Weight: 72 kg  Vital Signs: Temp: 98.3 F (36.8 C) (02/17 0827) Temp Source: Oral (02/17 0827) BP: 131/54 (02/17 1045) Pulse Rate: 81 (02/17 1045)  Labs: Recent Labs    06/05/21 0331 06/06/21 0319 06/07/21 0824  HGB 9.0* 8.7* 9.7*  HCT 27.9* 27.2* 30.8*  PLT 218 224 281  CREATININE  --   --  0.62  TROPONINIHS  --   --  6    Estimated Creatinine Clearance: 76.9 mL/min (by C-G formula based on SCr of 0.62 mg/dL).   Medical History: Past Medical History:  Diagnosis Date   Anxiety    Atherosclerotic renal artery stenosis, unilateral (HCC) 02/28/2014   Atrial fibrillation (HCC)    Bilateral lower extremity edema    Chronic diastolic heart failure ([place]) 10/21/2019   Diabetes mellitus without complication (HCC)    DVT (deep venous thrombosis) (HCC)    Dysrhythmia    Esophageal reflux    Essential hypertension 10/21/2019   Gout    Hyperlipidemia    Hypertension    Mixed hyperlipidemia 10/21/2019   Morbid obesity (HCC)    Obesity (BMI 30-39.9) 08/24/2019   Orthopnea    Osteoarthritis    Persistent atrial fibrillation ([place]) 10/21/2019   Pneumonia    Seasonal allergies    Sleep apnea     Medications:  Scheduled:  Infusions:  PRN:   Assessment: 76 yo male presented with shortness of breath found to have new bilateral PE with no heart strain. Patient was on apixaban PTA for hx DVT and patient just had R THA on 2/13. Patient was informed by cardiology to hold Eliquis starting 3 days prior to ortho surgery which would have been 2/10. POD1, patient was started on 2.5mg  BID and that was continued until discharge on 2/16. Patient then came back to [facility] ER on 2/17 AM with shortness of breath. After discussion with  ER Md and [facility], plan is to start IV heparin for time being while final plan is formulated. Baseline CBC drawn, Nurse said she was going to draw a heparin level and aPTT as well.   Goal of Therapy:  Heparin level 0.3-0.7 units/ml Heparin level 66-102 units/ml Monitor platelets by anticoagulation protocol: Yes   Plan:  No IV heparin bolus due to patient being on eliquis prior to admission Start IV heparin at rate of 1200 units/hr Check heparin level and APTT 8 hours after start of IV heparin  Daily CBC  Kara Mead 06/07/2021,11:31 AM

## 2021-06-07 NOTE — Assessment & Plan Note (Addendum)
Follow with ortho outpatient as planned

## 2021-06-07 NOTE — Assessment & Plan Note (Addendum)
Provoked in setting of recent surgery, holding eliquis around surgery CT with RUL subsegmental and LUL segmental and subsegmental pulmonary emboli Echo with normal RVSF, normal PASP, EF 60-65% LE [place] without DVT Transitioned from heparin back to eliquis (heme doubts eliquis failure in setting of needing to hold eliquis for surgery followed by decreased dose post op) Will discharge with eliquis, load.  Needs at least 3-6 months uninterrupted anticoagulation, will need to discuss with PCP vs heme long term anticoagulation plan.

## 2021-06-07 NOTE — H&P (Addendum)
History and Physical    Patient: Logan [name] QVZ:[id number] DOB: Jun 05, 1945 DOA: 06/07/2021 DOS: the patient was seen and examined on 06/07/2021 PCP: [name], MD  Patient coming from: SNF, [place]  Chief Complaint:  Chief Complaint  Patient presents with   Shortness of Breath    HPI: Logan [name] is a 76 y.o. male with medical history significant of gout, atrial fibrillation, prediabetes, hypertension, DVT, hyperlipidemia. Patient reports reports developing shortness of breath last night. Patients symptoms started abruptly and progressively worsened. Patient reports no associated chest pain, but did have a feeling of chest tightness. Patient reports not being given any analgesics for his symptoms but states he did get his blood pressure medications this morning. Eventually, the nurse at the facility recommended transfer to the emergency department for evaluation. Patient has associated productive cough without hemoptysis. No nausea, vomiting or diarrhea.  On review of MAR sent with patient from facility, documentation states he received Tylenol and Eliquis around 8:41 PM.  Review of Systems: As mentioned in the history of present illness. All other systems reviewed and are negative. Past Medical History:  Diagnosis Date   Anxiety    Atherosclerotic renal artery stenosis, unilateral (HCC) 02/28/2014   Atrial fibrillation (HCC)    Bilateral lower extremity edema    Chronic diastolic heart failure ([place]) 10/21/2019   Diabetes mellitus without complication (HCC)    DVT (deep venous thrombosis) (HCC)    Dysrhythmia    Esophageal reflux    Essential hypertension 10/21/2019   Gout    Hyperlipidemia    Hypertension    Mixed hyperlipidemia 10/21/2019   Morbid obesity ([place])    Obesity (BMI 30-39.9) 08/24/2019   Orthopnea    Osteoarthritis    Persistent atrial fibrillation ([place]) 10/21/2019   Pneumonia    Seasonal allergies    Sleep apnea    Past Surgical History:  Procedure  Laterality Date   ABDOMINAL AORTAGRAM N/A 02/28/2014   Procedure: ABDOMINAL Maxcine Ham;  Surgeon: Serafina Mitchell, MD;  Location: [facility] CATH LAB;  Service: Cardiovascular;  Laterality: N/A;   ANTERIOR CERVICAL DECOMP/DISCECTOMY FUSION N/A 01/01/2021   Procedure: ACDF C3-4;  Surgeon: Dawley, Theodoro Doing, DO;  Location: [facility];  Service: Neurosurgery;  Laterality: N/A;   APPENDECTOMY     ATRIAL FIBRILLATION ABLATION N/A 02/29/2020   Procedure: ATRIAL FIBRILLATION ABLATION;  Surgeon: Constance Haw, MD;  Location: [place] CV LAB;  Service: Cardiovascular;  Laterality: N/A;   CARDIOVERSION N/A 10/18/2020   Procedure: CARDIOVERSION (CATH LAB);  Surgeon: Constance Haw, MD;  Location: [facility] CV LAB;  Service: Cardiovascular;  Laterality: N/A;   CARPAL TUNNEL RELEASE Left    TOTAL HIP ARTHROPLASTY Left 06/03/2021   Procedure: LEFT TOTAL HIP ARTHROPLASTY ANTERIOR APPROACH;  Surgeon: Frederik Pear, MD;  Location: [facility] ORS;  Service: Orthopedics;  Laterality: Left;   Social History:  reports that he has quit smoking. He has quit using smokeless tobacco.  His smokeless tobacco use included chew. He reports that he does not drink alcohol and does not use drugs.  No Known Allergies  Family History  Problem Relation Age of Onset   Heart disease Mother    Cancer Father        throat cancer   Heart disease Brother    Atrial fibrillation Brother     Prior to Admission medications   Medication Sig Start Date End Date Taking? Authorizing Provider  acetaminophen (TYLENOL) 500 MG tablet Take 1,000 mg by mouth every 6 (six) hours as  needed for mild pain.    [provider]  allopurinol (ZYLOPRIM) 300 MG tablet Take 300 mg by mouth daily.    [provider]  apixaban (ELIQUIS) 2.5 MG TABS tablet Take 1 tablet (2.5 mg total) by mouth 2 (two) times daily. 2.5 mg BID x 2 weeks and then back to normal dose of 5 mg BID 06/05/21   Leighton Parody, PA-C  ascorbic acid (VITAMIN C) 500 MG  tablet Take 1 tablet (500 mg total) by mouth daily. Patient not taking: Reported on 05/22/2021 01/30/21   Love, Ivan Anchors, PA-C  carvedilol (COREG) 12.5 MG tablet Take 1 tablet (12.5 mg total) by mouth 2 (two) times daily with a meal. 01/30/21   Love, Ivan Anchors, PA-C  DHEA 25 MG CAPS Take 25 mg by mouth daily.    [provider]  diclofenac Sodium (VOLTAREN) 1 % GEL Apply 2 g topically 4 (four) times daily. To left hip Patient taking differently: Apply 2 g topically 4 (four) times daily as needed (pain). To left hip 01/30/21   Love, Ivan Anchors, PA-C  fluticasone [facility]) 50 MCG/ACT nasal spray Place 1 spray into both nostrils daily as needed for rhinitis.     [provider]  furosemide (LASIX) 40 MG tablet Take 1 tablet (40 mg total) by mouth daily. 01/30/21   Love, Ivan Anchors, PA-C  loratadine (CLARITIN) 10 MG tablet Take 10 mg by mouth daily as needed for allergies.    [provider]  losartan (COZAAR) 50 MG tablet Take 1 tablet (50 mg total) by mouth daily. 01/31/21   Love, Ivan Anchors, PA-C  Menthol, Topical Analgesic, (ICY HOT EX) Apply 1 application topically daily as needed (pain).    [provider]  Multiple Vitamin (MULTIVITAMIN) tablet Take 1 tablet by mouth daily.    [provider]  omeprazole (PRILOSEC) 20 MG capsule Take 20 mg by mouth daily as needed (for heartburn).     [provider]  oxyCODONE-acetaminophen (PERCOCET/ROXICET) 5-325 MG tablet Take 1 tablet by mouth every 4 (four) hours as needed for severe pain. 06/05/21   Leighton Parody, PA-C  potassium chloride (KLOR-CON) 10 MEQ tablet Take 2 tablets (20 mEq total) by mouth 2 (two) times daily. Patient not taking: Reported on 05/22/2021 01/30/21   Love, Ivan Anchors, PA-C  potassium chloride SA (KLOR-CON M) 20 MEQ tablet Take 20 mEq by mouth daily.    [provider]  simethicone (MYLICON) 80 MG chewable tablet Chew 2 tablets (160 mg total) by mouth 4 (four) times daily. Patient  taking differently: Chew 160 mg by mouth 4 (four) times daily as needed for flatulence. 01/30/21   Love, Ivan Anchors, PA-C  tiZANidine (ZANAFLEX) 2 MG tablet Take 1 tablet (2 mg total) by mouth every 6 (six) hours as needed. 06/05/21   Leighton Parody, PA-C  traZODone (DESYREL) 50 MG tablet Take 1 tablet (50 mg total) by mouth at bedtime as needed for sleep. 03/04/21   Lovorn, Jinny Blossom, MD  potassium chloride (MICRO-K) 10 MEQ CR capsule Take 10 mEq by mouth daily. 10/23/20 01/29/21  [provider]    Physical Exam: Vitals:   06/07/21 0900 06/07/21 0915 06/07/21 1030 06/07/21 1045  BP: (!) 152/65 (!) 155/104 135/67 (!) 131/54  Pulse: 87 72 74 81  Resp: (!) 21 16 18 16   Temp:      TempSrc:      SpO2: (!) 84% 100% 100% 97%  Weight:  Height:       General exam: Appears calm and comfortable Respiratory system: Rhonchi bilaterally. Respiratory effort normal without tachypnea or accessory muscle usage. Cardiovascular system: S1 & S2 heard Gastrointestinal system: Abdomen is nondistended, soft and nontender. Normal bowel sounds heard. Central nervous system: Alert and oriented. No focal neurological deficits. Musculoskeletal:  No calf tenderness Skin: No cyanosis. Psychiatry: Judgement and insight appear normal. Mood & affect appropriate.   Data Reviewed: EKG reviewed and consistent with rate controlled atrial fibrillation. CBC significant for a WBC of 11,300 and hemoglobin of 9.7   Assessment and Plan: * Acute pulmonary embolism (HCC)- (present on admission) Bilateral PEs located in right upper lobe (subsegmental) and left upper lobe (segmental) without evidence of central/saddle embolism or right heart strain on imaging. Started on Heparin IV. Discussed case with hematology who thinks it is unlikely failure of Eliquis since patient was discharged on lower dose post-surgery. -Continue heparin IV today -Switch to Eliquis in AM with recommendation for 7-day load per  hematology -PT/OT  Aspiration pneumonia ([place]) Noted to be likely on imaging. Patient with productive coughing. Afebrile but with an elevated WBC. Complicated by recent intubation for surgery. Started on Zosyn in the ED. -Switch to Unasyn IV -Sputum culture  Acute respiratory failure with hypoxia (HCC) Secondary to acute PE and aspiration pneumonia. Initially hypoxic with documented SpO2 down to 84%. Patient initially required 15 L/min oxygen delivery and has improved. Currently down to 2 L/min of oxygen -Wean to room air as able -Ambulatory pulse ox prior to discharge  H/O total hip arthroplasty, left WBAT LLE. Patient presented from SNF [facility] consult for likely placement needs  Macrocytic anemia Normal Vitamin B12 and folate from 2022. Baseline hemoglobin of about 12 with post-operative hemoglobin of 9-10. Currently stable. -Obtain Vitamin B12/folate  Atrial fibrillation ([place])- (present on admission) Persistent atrial fibrillation per notes. On Coreg and Eliquis -Continue Coreg -Anticoagulation as mentioned in problem, Acute pulmonary embolism  Gout- (present on admission) -Continue allopurinol  Hypertension- (present on admission) -Continue Coreg and losartan -Hydralazine PRN  Primary osteoarthritis of left hip- (present on admission) Patient is s/p total hip arthroplasty on 06/03/2021    Advance Care Planning: Full code   Consults: Hematology (curbside)  Family Communication: None at bedside   Author: Cordelia Poche, MD 06/07/2021 12:58 PM  For on call review [email or web address].

## 2021-06-07 NOTE — Progress Notes (Signed)
Pharmacy Antibiotic Note  Logan [name] is a 76 y.o. male admitted on 06/07/2021 with shortness of breath, imaging concerning for aspiration pneumonia.  Pharmacy has been consulted for Unasyn dosing.  Plan: Unasyn 3g IV q6 hours Monitor clinical improvement, renal function, ability to transition to oral antibiotics  Height: 5\' 7"  (170.2 cm) Weight: 72.1 kg (159 lb) IBW/kg (Calculated) : 66.1  Temp (24hrs), Avg:98.3 F (36.8 C), Min:98.3 F (36.8 C), Max:98.3 F (36.8 C)  Recent Labs  Lab 06/04/21 0328 06/05/21 0331 06/06/21 0319 06/07/21 0824 06/07/21 1024  WBC 12.0* 12.6* 11.4* 11.3*  --   CREATININE 0.67  --   --  0.62  --   LATICACIDVEN  --   --   --  1.1 1.1    Estimated Creatinine Clearance: 76.9 mL/min (by C-G formula based on SCr of 0.62 mg/dL).    No Known Allergies  Antimicrobials this admission: Zosyn 2/17 >> 2/17 Unasyn 2/17 >>   Dose adjustments this admission:   Microbiology results: 2/17 Sputum:     Thank you for allowing pharmacy to be a part of this patients care.  Dimple Nanas, PharmD 06/07/2021 5:56 PM

## 2021-06-07 NOTE — Assessment & Plan Note (Addendum)
Noted to be likely on imaging. Patient with productive coughing. Afebrile but with an elevated WBC. Complicated by recent intubation for surgery. Started on Zosyn in the ED. Sputum culture significant for respiratory flora. Patient transitioned to Unasyn and discharged on Augmentin. Flutter valve ordered. Mucinex ordered.

## 2021-06-07 NOTE — ED Notes (Signed)
Pt is in X-RAY

## 2021-06-07 NOTE — Assessment & Plan Note (Addendum)
Patient is s/p total hip arthroplasty on 06/03/2021 Follow with orthopedics as planned

## 2021-06-07 NOTE — Assessment & Plan Note (Addendum)
Rate controlled. Persistent atrial fibrillation per notes.  Holding coreg at discharge Continue eliquis

## 2021-06-07 NOTE — Progress Notes (Signed)
Bilateral lower extremity venous duplex has been completed. Preliminary results can be found in CV Proc through chart review.  Results were given to Dr. Sherry Ruffing.  06/07/21 10:14 AM Logan [name] RVT

## 2021-06-07 NOTE — Assessment & Plan Note (Addendum)
Secondary to acute PE and aspiration pneumonia. Initially hypoxic with documented SpO2 down to 84%. Patient initially required 15 L/min oxygen delivery and has improved. Weaned down to room air.

## 2021-06-07 NOTE — Assessment & Plan Note (Addendum)
Hypotension today, hold losartan and coreg for now Consider resumption outpatient as tolerated BP's improved at time of discharge

## 2021-06-07 NOTE — Assessment & Plan Note (Addendum)
Normal Vitamin B12 and folate from 2022. Baseline hemoglobin of about 12 with post-operative hemoglobin of 9-10. Currently stable. Normal Vitamin B12/folate.

## 2021-06-07 NOTE — ED Provider Notes (Signed)
[facility] DEPT Provider Note   CSN: [id number] Arrival date & time: 06/07/21  7829     History  Chief Complaint  Patient presents with   Shortness of Breath    Logan [name] is a 76 y.o. male.  The history is provided by the patient, medical records and the EMS personnel. No language interpreter was used.  Shortness of Breath Severity:  Severe Onset quality:  Sudden Duration:  1 day Timing:  Constant Progression:  Waxing and waning Chronicity:  New Context: not URI   Relieved by:  Nothing Worsened by:  Nothing Ineffective treatments:  None tried Associated symptoms: chest pain, cough, sputum production and wheezing   Associated symptoms: no abdominal pain, no diaphoresis, no fever, no headaches, no hemoptysis, no neck pain, no rash, no syncope and no vomiting   Risk factors: hx of PE/DVT and recent surgery       Home Medications Prior to Admission medications   Medication Sig Start Date End Date Taking? Authorizing Provider  acetaminophen (TYLENOL) 500 MG tablet Take 1,000 mg by mouth every 6 (six) hours as needed for mild pain.    [provider]  allopurinol (ZYLOPRIM) 300 MG tablet Take 300 mg by mouth daily.    [provider]  apixaban (ELIQUIS) 2.5 MG TABS tablet Take 1 tablet (2.5 mg total) by mouth 2 (two) times daily. 2.5 mg BID x 2 weeks and then back to normal dose of 5 mg BID 06/05/21   Leighton Parody, PA-C  ascorbic acid (VITAMIN C) 500 MG tablet Take 1 tablet (500 mg total) by mouth daily. Patient not taking: Reported on 05/22/2021 01/30/21   Love, Ivan Anchors, PA-C  carvedilol (COREG) 12.5 MG tablet Take 1 tablet (12.5 mg total) by mouth 2 (two) times daily with a meal. 01/30/21   Love, Ivan Anchors, PA-C  DHEA 25 MG CAPS Take 25 mg by mouth daily.    [provider]  diclofenac Sodium (VOLTAREN) 1 % GEL Apply 2 g topically 4 (four) times daily. To left hip Patient taking differently: Apply 2 g topically 4  (four) times daily as needed (pain). To left hip 01/30/21   Love, Ivan Anchors, PA-C  fluticasone [facility]) 50 MCG/ACT nasal spray Place 1 spray into both nostrils daily as needed for rhinitis.     [provider]  furosemide (LASIX) 40 MG tablet Take 1 tablet (40 mg total) by mouth daily. 01/30/21   Love, Ivan Anchors, PA-C  loratadine (CLARITIN) 10 MG tablet Take 10 mg by mouth daily as needed for allergies.    [provider]  losartan (COZAAR) 50 MG tablet Take 1 tablet (50 mg total) by mouth daily. 01/31/21   Love, Ivan Anchors, PA-C  Menthol, Topical Analgesic, (ICY HOT EX) Apply 1 application topically daily as needed (pain).    [provider]  Multiple Vitamin (MULTIVITAMIN) tablet Take 1 tablet by mouth daily.    [provider]  omeprazole (PRILOSEC) 20 MG capsule Take 20 mg by mouth daily as needed (for heartburn).     [provider]  oxyCODONE-acetaminophen (PERCOCET/ROXICET) 5-325 MG tablet Take 1 tablet by mouth every 4 (four) hours as needed for severe pain. 06/05/21   Leighton Parody, PA-C  potassium chloride (KLOR-CON) 10 MEQ tablet Take 2 tablets (20 mEq total) by mouth 2 (two) times daily. Patient not taking: Reported on 05/22/2021 01/30/21   Bary Leriche, PA-C  potassium chloride SA (KLOR-CON M) 20 MEQ tablet Take  20 mEq by mouth daily.    [provider]  simethicone (MYLICON) 80 MG chewable tablet Chew 2 tablets (160 mg total) by mouth 4 (four) times daily. Patient taking differently: Chew 160 mg by mouth 4 (four) times daily as needed for flatulence. 01/30/21   Love, Ivan Anchors, PA-C  tiZANidine (ZANAFLEX) 2 MG tablet Take 1 tablet (2 mg total) by mouth every 6 (six) hours as needed. 06/05/21   Leighton Parody, PA-C  traZODone (DESYREL) 50 MG tablet Take 1 tablet (50 mg total) by mouth at bedtime as needed for sleep. 03/04/21   Lovorn, Jinny Blossom, MD  potassium chloride (MICRO-K) 10 MEQ CR capsule Take 10 mEq by mouth daily. 10/23/20 01/29/21   [provider]      Allergies    Patient has no known allergies.    Review of Systems   Review of Systems  Constitutional:  Negative for chills, diaphoresis, fatigue and fever.  HENT:  Negative for congestion.   Eyes:  Negative for visual disturbance.  Respiratory:  Positive for cough, sputum production, chest tightness, shortness of breath and wheezing. Negative for hemoptysis.   Cardiovascular:  Positive for chest pain and leg swelling. Negative for palpitations and syncope.  Gastrointestinal:  Positive for constipation. Negative for abdominal distention, abdominal pain, diarrhea, nausea and vomiting.  Genitourinary:  Negative for dysuria, flank pain and frequency.  Musculoskeletal:  Negative for back pain, neck pain and neck stiffness.  Skin:  Negative for rash and wound.  Neurological:  Negative for dizziness, weakness, light-headedness and headaches.  Psychiatric/Behavioral:  Negative for agitation and confusion.   All other systems reviewed and are negative.  Physical Exam Updated Vital Signs BP 135/67    Pulse 74    Temp 98.3 F (36.8 C) (Oral)    Resp 18    Ht 5\' 7"  (1.702 m)    Wt 72.1 kg    SpO2 100%    BMI 24.90 kg/m  Physical Exam Vitals and nursing note reviewed.  Constitutional:      General: He is in acute distress.     Appearance: He is well-developed. He is ill-appearing.  HENT:     Head: Normocephalic and atraumatic.     Nose: No congestion or rhinorrhea.     Mouth/Throat:     Mouth: Mucous membranes are moist.     Pharynx: No oropharyngeal exudate or posterior oropharyngeal erythema.  Eyes:     Extraocular Movements: Extraocular movements intact.     Conjunctiva/sclera: Conjunctivae normal.     Pupils: Pupils are equal, round, and reactive to light.  Cardiovascular:     Rate and Rhythm: Regular rhythm. Tachycardia present.     Heart sounds: No murmur heard. Pulmonary:     Effort: Respiratory distress present.     Breath sounds: No stridor.  Wheezing and rhonchi present. No rales.  Chest:     Chest wall: No tenderness.  Abdominal:     General: Abdomen is flat.     Palpations: Abdomen is soft.     Tenderness: There is no abdominal tenderness. There is no right CVA tenderness, left CVA tenderness, guarding or rebound.  Musculoskeletal:        General: Tenderness present. No swelling.     Cervical back: Neck supple. No tenderness.     Right lower leg: Edema present.     Left lower leg: Edema present.  Skin:    General: Skin is warm and dry.     Capillary Refill: Capillary refill  takes less than 2 seconds.     Findings: Erythema present. No rash.  Neurological:     General: No focal deficit present.     Mental Status: He is alert.     Sensory: No sensory deficit.     Motor: No weakness.  Psychiatric:        Mood and Affect: Mood normal.    ED Results / Procedures / Treatments   Labs (all labs ordered are listed, but only abnormal results are displayed) Labs Reviewed  CBC WITH DIFFERENTIAL/PLATELET - Abnormal; Notable for the following components:      Result Value   WBC 11.3 (*)    RBC 2.98 (*)    Hemoglobin 9.7 (*)    HCT 30.8 (*)    MCV 103.4 (*)    Neutro Abs 8.3 (*)    Abs Immature Granulocytes 0.08 (*)    All other components within normal limits  COMPREHENSIVE METABOLIC PANEL - Abnormal; Notable for the following components:   CO2 33 (*)    Calcium 8.8 (*)    Total Protein 5.8 (*)    Albumin 2.8 (*)    Total Bilirubin 1.3 (*)    All other components within normal limits  BRAIN NATRIURETIC PEPTIDE - Abnormal; Notable for the following components:   B Natriuretic Peptide 227.0 (*)    All other components within normal limits  RESP PANEL BY RT-PCR (FLU A&B, COVID) ARPGX2  LIPASE, BLOOD  LACTIC ACID, PLASMA  LACTIC ACID, PLASMA  APTT  HEPARIN LEVEL (UNFRACTIONATED)  TROPONIN I (HIGH SENSITIVITY)  TROPONIN I (HIGH SENSITIVITY)    EKG EKG Interpretation  Date/Time:  Friday June 07 2021  08:23:26 EST Ventricular Rate:  79 PR Interval:    QRS Duration: 75 QT Interval:  389 QTC Calculation: 446 R Axis:   54 Text Interpretation: Atrial fibrillation Low voltage, extremity and precordial leads Probable anteroseptal infarct, old when compared to prior, similar appearance. No STEMI Confirmed by Antony Blackbird [phone]) on 06/07/2021 8:54:20 AM  Radiology CT Angio Chest PE W and/or Wo Contrast  Result Date: 06/07/2021 CLINICAL DATA:  Pulmonary embolism (PE) suspected, high prob EXAM: CT ANGIOGRAPHY CHEST WITH CONTRAST TECHNIQUE: Multidetector CT imaging of the chest was performed using the standard protocol during bolus administration of intravenous contrast. Multiplanar CT image reconstructions and MIPs were obtained to evaluate the vascular anatomy. RADIATION DOSE REDUCTION: This exam was performed according to the departmental dose-optimization program which includes automated exposure control, adjustment of the mA and/or kV according to patient size and/or use of iterative reconstruction technique. CONTRAST:  154mL OMNIPAQUE IOHEXOL 350 MG/ML SOLN COMPARISON:  Radiograph 06/07/2021, right shoulder CT 01/15/2021 FINDINGS: Cardiovascular: Mild cardiomegaly. No pericardial disease. Coronary artery calcifications. Normal size main and branch pulmonary arteries. There is a right upper lobe subsegmental pulmonary embolism and a left upper lobe segmental and subsegmental pulmonary embolism (series 5, images 100 and 110-111). No central/saddle embolism. No evidence of right heart strain (RV: LV ratio is 0.8). There is moderate atherosclerotic calcifications of the aortic arch and descending aorta. Mediastinum/Nodes: No lymphadenopathy.The thyroid is unremarkable. The esophagus is unremarkable. Lungs/Pleura: There is endobronchial material within the left mainstem and lower lobe bronchus, with near complete left lower lobe collapse and areas of decreased density within the collapsed left lower lobe  consistent with pneumonia. Trace respiratory secretions in the trachea.Right basilar atelectasis. Lingular subsegmental atelectasis. Small left and trace right pleural effusions. No pneumothorax. Upper Abdomen: Calcified hepatic granulomas. Musculoskeletal: There are subacute left posterior ninth,  tenth, and eleventh rib fractures near the costovertebral junction.Anterior superior right glenohumeral dislocation and fragmented fracture of the coracoid. Dislocation was present on prior CT in September 22, but the fracture of the coracoid is new since that exam. There is severe left and mild-to-moderate right [place] joint arthritis with slight posterior subluxation of right medial clavicle with respect to the manubrium. Review of the MIP images confirms the above findings. IMPRESSION: Right upper lobe subsegmental and left upper lobe segmental and subsegmental pulmonary emboli. No central/saddle embolism. No CT evidence of right heart strain. Endobronchial material within the left mainstem and left lower lobe bronchus, with near complete left lower lobe collapse and findings suggestive of left lower lobe aspiration pneumonia. Small left and trace right pleural effusions. Right basilar and lingular atelectasis. Subacute left posterior ninth, tenth, and eleventh rib fractures near the costovertebral junction. Anterior superior right glenohumeral dislocation with fragmented fracture of the coracoid. The right shoulder was dislocated on prior CT in September 22, but the coracoid fracture is new. Aortic Atherosclerosis (ICD10-I70.0). Critical Value/emergent results were called by telephone at the time of interpretation on 06/07/2021 at 10:06 am to provider taking care of the patient, who verbally acknowledged these results. Electronically Signed   By: Maurine Simmering M.D.   On: 06/07/2021 10:21   DG Chest Portable 1 View  Result Date: 06/07/2021 CLINICAL DATA:  Hypoxia, shortness of breath. EXAM: PORTABLE CHEST 1 VIEW  COMPARISON:  May 27, 2021. FINDINGS: The heart size and mediastinal contours are within normal limits. Hypoinflation of the lungs is noted with mild bibasilar subsegmental atelectasis. The visualized skeletal structures are unremarkable. IMPRESSION: Hypoinflation of the lungs with mild bibasilar subsegmental atelectasis. Electronically Signed   By: Marijo Conception M.D.   On: 06/07/2021 08:51   DG HIP UNILAT WITH PELVIS 1V LEFT  Result Date: 06/06/2021 CLINICAL DATA:  History of left hip replacement on 06/03/2021 EXAM: DG HIP (WITH OR WITHOUT PELVIS) 1V*L* COMPARISON:  Intraoperative hip radiograph 06/03/2021 FINDINGS: Postsurgical changes reflecting left hip arthroplasty are again seen. Prosthesis is high riding within the acetabulum, similar to the intraoperative radiographs. There is no evidence of complication. Mild degenerative change about the right hip is noted. The SI joints and symphysis pubis are intact. IMPRESSION: Postsurgical changes reflecting left hip arthroplasty without evidence of complication. Electronically Signed   By: Valetta Mole M.D.   On: 06/06/2021 09:17   VAS [place] LOWER EXTREMITY VENOUS (DVT) (ONLY [facility] & [facility])  Result Date: 06/07/2021  Lower Venous DVT Study Patient Name:  ONOFRE GAINS  Date of Exam:   06/07/2021 Medical Rec #: [id number]    Accession #:    [id number] Date of Birth: [birth year]    Patient Gender: M Patient Age:   64 years Exam Location:  [facility] Procedure:      VAS [place] LOWER EXTREMITY VENOUS (DVT) Referring Phys: Marda Stalker --------------------------------------------------------------------------------  Indications: Pain.  Risk Factors: None identified. Limitations: Poor ultrasound/tissue interface. Comparison Study: No prior studies. Performing Technologist: Oliver Hum RVT  Examination Guidelines: A complete evaluation includes B-mode imaging, spectral Doppler, color Doppler, and power Doppler as needed of all accessible portions of each  vessel. Bilateral testing is considered an integral part of a complete examination. Limited examinations for reoccurring indications may be performed as noted. The reflux portion of the exam is performed with the patient in reverse Trendelenburg.  +---------+---------------+---------+-----------+----------+--------------+  RIGHT     Compressibility Phasicity Spontaneity Properties Thrombus Aging  +---------+---------------+---------+-----------+----------+--------------+  CFV  Full            Yes       Yes                                    +---------+---------------+---------+-----------+----------+--------------+  SFJ       Full                                                             +---------+---------------+---------+-----------+----------+--------------+  FV Prox   Full                                                             +---------+---------------+---------+-----------+----------+--------------+  FV Mid    Full                                                             +---------+---------------+---------+-----------+----------+--------------+  FV Distal Full                                                             +---------+---------------+---------+-----------+----------+--------------+  PFV       Full                                                             +---------+---------------+---------+-----------+----------+--------------+  POP       Full            Yes       Yes                                    +---------+---------------+---------+-----------+----------+--------------+  PTV       Full                                                             +---------+---------------+---------+-----------+----------+--------------+  PERO      Full                                                             +---------+---------------+---------+-----------+----------+--------------+   +---------+---------------+---------+-----------+----------+--------------+  LEFT       Compressibility Phasicity Spontaneity Properties Thrombus Aging  +---------+---------------+---------+-----------+----------+--------------+  CFV       Full            Yes       Yes                                    +---------+---------------+---------+-----------+----------+--------------+  SFJ       Full                                                             +---------+---------------+---------+-----------+----------+--------------+  FV Prox   Full                                                             +---------+---------------+---------+-----------+----------+--------------+  FV Mid    Full                                                             +---------+---------------+---------+-----------+----------+--------------+  FV Distal Full                                                             +---------+---------------+---------+-----------+----------+--------------+  PFV       Full                                                             +---------+---------------+---------+-----------+----------+--------------+  POP       Full            Yes       Yes                                    +---------+---------------+---------+-----------+----------+--------------+  PTV       Full                                                             +---------+---------------+---------+-----------+----------+--------------+  PERO      Full                                                             +---------+---------------+---------+-----------+----------+--------------+  Summary: RIGHT: - There is no evidence of deep vein thrombosis in the lower extremity.  - No cystic structure found in the popliteal fossa.  LEFT: - There is no evidence of deep vein thrombosis in the lower extremity.  - No cystic structure found in the popliteal fossa.  *See table(s) above for measurements and observations.    Preliminary     Procedures Procedures    CRITICAL CARE Performed by: Gwenyth Allegra Fergus Throne Total  critical care time: 40 minutes Critical care time was exclusive of separately billable procedures and treating other patients. Critical care was necessary to treat or prevent imminent or life-threatening deterioration. Critical care was time spent personally by me on the following activities: development of treatment plan with patient and/or surrogate as well as nursing, discussions with consultants, evaluation of patient's response to treatment, examination of patient, obtaining history from patient or surrogate, ordering and performing treatments and interventions, ordering and review of laboratory studies, ordering and review of radiographic studies, pulse oximetry and re-evaluation of patient's condition.   Medications Ordered in ED Medications  fentaNYL (SUBLIMAZE) injection 50 mcg (50 mcg Intravenous Given 06/07/21 0904)  sodium chloride (PF) 0.9 % injection (  Given by Other 06/07/21 1023)  iohexol (OMNIPAQUE) 350 MG/ML injection 100 mL (100 mLs Intravenous Contrast Given 06/07/21 0936)  piperacillin-tazobactam (ZOSYN) IVPB 3.375 g (3.375 g Intravenous New Bag/Given 06/07/21 1102)    ED Course/ Medical Decision Making/ A&P                           Medical Decision Making Amount and/or Complexity of Data Reviewed Labs: ordered. Radiology: ordered. ECG/medicine tests: ordered.  Risk Prescription drug management. Decision regarding hospitalization.    Logan [name] is a 76 y.o. male with a past medical history significant for atrial fibrillation on Eliquis therapy, previous DVT, CHF, hypertension, hyperlipidemia, anxiety, diabetes, GERD, sleep apnea, and recent left hip arthroplasty on 06/03/2021 (4 days ago) who presents with hypoxia, shortness of breath, respiratory distress, chest tightness, and some constipation.  According to patient, he has been doing well after surgery but overnight start developing shortness of breath.  He reports chest tightness and some wheezing and could not  breathe.  He thought he " might not make it overnight".  He was discharged yesterday from his hospital stay from hip surgery.  He reports no abdominal pain but reports has not had a bowel movement several days.  He is still passing a good amount of gas he reports however and does not report that his abdomen feels distended.  He is having some cough with green sputum and was found by EMS to be hypoxic with oxygen saturations in the mid 80s on room air.  He does not take oxygen at home.  He was given albuterol by the facility and then a DuoNeb, magnesium, and Solu-Medrol with EMS.  He is on 8 L nonrebreather on arrival which I increased to 10 when his oxygen saturations are still about 89%.  Patient reports they held his Eliquis for surgery but he is back on it now.  He reports he is still having some pain and swelling in his left leg after surgery with some redness.  He reports that chest discomfort is an 8 out of 10 in severity.  He denies back pain headache, or neck pain.  Denies any acute fevers or chills.  Denies nausea, vomiting, or other complaints.  On exam, patient does have rhonchi in all  lung fields.  Very faint wheezing that reportedly has improved since EMS started treating him.  Chest was nontender.  Abdomen was nontender.  Bowel sounds were appreciated.  Left leg has surgical wound still in place and bandaged but does have some erythema.  Mild edema in both legs.  Intact sensation, strength, and pulses distally.  Patient has increased work of breathing and is still short of breath.  EKG showed no STEMI but still in A-fib chronically.   Clinically I am concerned that several etiologies being a post surgical pneumonia and PE being the largest concerns initially.  We will get chest x-ray followed by CT PE scan.  With his still passing gas and no abdominal pain at this time, will hold on imaging of his abdomen at this time.  Less suspicion for bowel obstruction causing the shortness of breath.  We  will get DVT ultrasound of the legs and will give the patient some pain medicine.  Patient is already had Solu-Medrol, magnesium, and breathing treatment so we will continue to monitor his breathing.  He does not have a history of asthma or COPD in the chart and has not had a history of significant wheezing.  We will check a BNP for heart failure exacerbation however due to the hypoxia, patient will need admission work-up is completed.  I visualized chest x-ray and do not see evidence of acute pneumothorax.    Still waiting for the results to determine disposition.        10:34 AM Radiology called to report that CT shows both bilateral PE and concern for aspiration pneumonia.  DVT ultrasound negative.   We will order antibiotics for pneumonia and will discuss anticoagulation regimen with admitting team.  We will call for admission for new hypoxia.   Medicine will admit for further management.  Pharmacy was able to determine the patient does not appear to have been on a decreased dose of Eliquis prior to the surgery and then was on a reduced dose immediately following the surgery. He last took a dose last night.  Medicine requested starting heparin and they can further sort out best anticoagulation plan for him during admission.         Final Clinical Impression(s) / ED Diagnoses Final diagnoses:  Hypoxia  SOB (shortness of breath)  Other acute pulmonary embolism without acute cor pulmonale (HCC)  Aspiration pneumonia, unspecified aspiration pneumonia type, unspecified laterality, unspecified part of lung (HCC)    Clinical Impression: 1. Hypoxia   2. SOB (shortness of breath)   3. Other acute pulmonary embolism without acute cor pulmonale (HCC)   4. Aspiration pneumonia, unspecified aspiration pneumonia type, unspecified laterality, unspecified part of lung ([place])     Disposition: Admit  This note was prepared with assistance of Dragon voice recognition software. Occasional  wrong-word or sound-a-like substitutions may have occurred due to the inherent limitations of voice recognition software.      Victormanuel Mclure, Gwenyth Allegra, MD 06/07/21 1136

## 2021-06-07 NOTE — Assessment & Plan Note (Addendum)
Continue allopurinol 

## 2021-06-07 NOTE — ED Triage Notes (Signed)
Pt coming from [facility] with c/o SOB starting suddenly last night. Pt had left hip sx 1 wk ago. Hx blood clots. Per EMS, expiratory wheezes upon auscultation and a cough with green sputum. Pt is on 8 L currently with sats 95-100%, pt not on O2 at baseline.

## 2021-06-08 ENCOUNTER — Observation Stay (HOSPITAL_COMMUNITY): Payer: Medicare HMO

## 2021-06-08 DIAGNOSIS — I5032 Chronic diastolic (congestive) heart failure: Secondary | ICD-10-CM | POA: Diagnosis present

## 2021-06-08 DIAGNOSIS — R339 Retention of urine, unspecified: Secondary | ICD-10-CM | POA: Diagnosis not present

## 2021-06-08 DIAGNOSIS — Z981 Arthrodesis status: Secondary | ICD-10-CM | POA: Diagnosis not present

## 2021-06-08 DIAGNOSIS — Z86718 Personal history of other venous thrombosis and embolism: Secondary | ICD-10-CM | POA: Diagnosis not present

## 2021-06-08 DIAGNOSIS — I2602 Saddle embolus of pulmonary artery with acute cor pulmonale: Secondary | ICD-10-CM | POA: Diagnosis not present

## 2021-06-08 DIAGNOSIS — Z808 Family history of malignant neoplasm of other organs or systems: Secondary | ICD-10-CM | POA: Diagnosis not present

## 2021-06-08 DIAGNOSIS — Z87891 Personal history of nicotine dependence: Secondary | ICD-10-CM | POA: Diagnosis not present

## 2021-06-08 DIAGNOSIS — Z7901 Long term (current) use of anticoagulants: Secondary | ICD-10-CM | POA: Diagnosis not present

## 2021-06-08 DIAGNOSIS — J9601 Acute respiratory failure with hypoxia: Secondary | ICD-10-CM | POA: Diagnosis present

## 2021-06-08 DIAGNOSIS — K219 Gastro-esophageal reflux disease without esophagitis: Secondary | ICD-10-CM | POA: Diagnosis present

## 2021-06-08 DIAGNOSIS — D539 Nutritional anemia, unspecified: Secondary | ICD-10-CM | POA: Diagnosis present

## 2021-06-08 DIAGNOSIS — Z96642 Presence of left artificial hip joint: Secondary | ICD-10-CM | POA: Diagnosis present

## 2021-06-08 DIAGNOSIS — E119 Type 2 diabetes mellitus without complications: Secondary | ICD-10-CM | POA: Diagnosis present

## 2021-06-08 DIAGNOSIS — X58XXXA Exposure to other specified factors, initial encounter: Secondary | ICD-10-CM | POA: Diagnosis present

## 2021-06-08 DIAGNOSIS — I2694 Multiple subsegmental pulmonary emboli without acute cor pulmonale: Secondary | ICD-10-CM | POA: Diagnosis present

## 2021-06-08 DIAGNOSIS — D62 Acute posthemorrhagic anemia: Secondary | ICD-10-CM | POA: Diagnosis present

## 2021-06-08 DIAGNOSIS — I4821 Permanent atrial fibrillation: Secondary | ICD-10-CM | POA: Diagnosis not present

## 2021-06-08 DIAGNOSIS — G4730 Sleep apnea, unspecified: Secondary | ICD-10-CM | POA: Diagnosis present

## 2021-06-08 DIAGNOSIS — R0602 Shortness of breath: Secondary | ICD-10-CM | POA: Diagnosis present

## 2021-06-08 DIAGNOSIS — T81718A Complication of other artery following a procedure, not elsewhere classified, initial encounter: Secondary | ICD-10-CM | POA: Diagnosis present

## 2021-06-08 DIAGNOSIS — Y831 Surgical operation with implant of artificial internal device as the cause of abnormal reaction of the patient, or of later complication, without mention of misadventure at the time of the procedure: Secondary | ICD-10-CM | POA: Diagnosis present

## 2021-06-08 DIAGNOSIS — Z471 Aftercare following joint replacement surgery: Secondary | ICD-10-CM | POA: Diagnosis not present

## 2021-06-08 DIAGNOSIS — I4819 Other persistent atrial fibrillation: Secondary | ICD-10-CM | POA: Diagnosis present

## 2021-06-08 DIAGNOSIS — I959 Hypotension, unspecified: Secondary | ICD-10-CM | POA: Diagnosis not present

## 2021-06-08 DIAGNOSIS — Z20822 Contact with and (suspected) exposure to covid-19: Secondary | ICD-10-CM | POA: Diagnosis present

## 2021-06-08 DIAGNOSIS — J690 Pneumonitis due to inhalation of food and vomit: Secondary | ICD-10-CM | POA: Diagnosis present

## 2021-06-08 DIAGNOSIS — I701 Atherosclerosis of renal artery: Secondary | ICD-10-CM | POA: Diagnosis present

## 2021-06-08 DIAGNOSIS — I110 Hypertensive heart disease with heart failure: Secondary | ICD-10-CM | POA: Diagnosis present

## 2021-06-08 DIAGNOSIS — I2699 Other pulmonary embolism without acute cor pulmonale: Secondary | ICD-10-CM | POA: Diagnosis present

## 2021-06-08 DIAGNOSIS — M109 Gout, unspecified: Secondary | ICD-10-CM | POA: Diagnosis present

## 2021-06-08 DIAGNOSIS — Z8249 Family history of ischemic heart disease and other diseases of the circulatory system: Secondary | ICD-10-CM | POA: Diagnosis not present

## 2021-06-08 LAB — CBC
HCT: 27.7 % — ABNORMAL LOW (ref 39.0–52.0)
Hemoglobin: 8.8 g/dL — ABNORMAL LOW (ref 13.0–17.0)
MCH: 32 pg (ref 26.0–34.0)
MCHC: 31.8 g/dL (ref 30.0–36.0)
MCV: 100.7 fL — ABNORMAL HIGH (ref 80.0–100.0)
Platelets: 292 10*3/uL (ref 150–400)
RBC: 2.75 MIL/uL — ABNORMAL LOW (ref 4.22–5.81)
RDW: 15.2 % (ref 11.5–15.5)
WBC: 10.4 10*3/uL (ref 4.0–10.5)
nRBC: 0 % (ref 0.0–0.2)

## 2021-06-08 LAB — FOLATE: Folate: 17.2 ng/mL (ref >5.9)

## 2021-06-08 LAB — APTT: aPTT: 49 seconds — ABNORMAL HIGH (ref 24–36)

## 2021-06-08 LAB — VITAMIN B12: Vitamin B-12: 230 pg/mL (ref 180–914)

## 2021-06-08 IMAGING — DX DG HIP (WITH OR WITHOUT PELVIS) 1V*L*
2 series · 2 of 2 positions shown · non-contrast
Comparison: Radiograph [DATE]

CLINICAL DATA: Total hip arthroplasty

EXAM:
DG HIP (WITH OR WITHOUT PELVIS) 1V*L*

[pelvis ap]
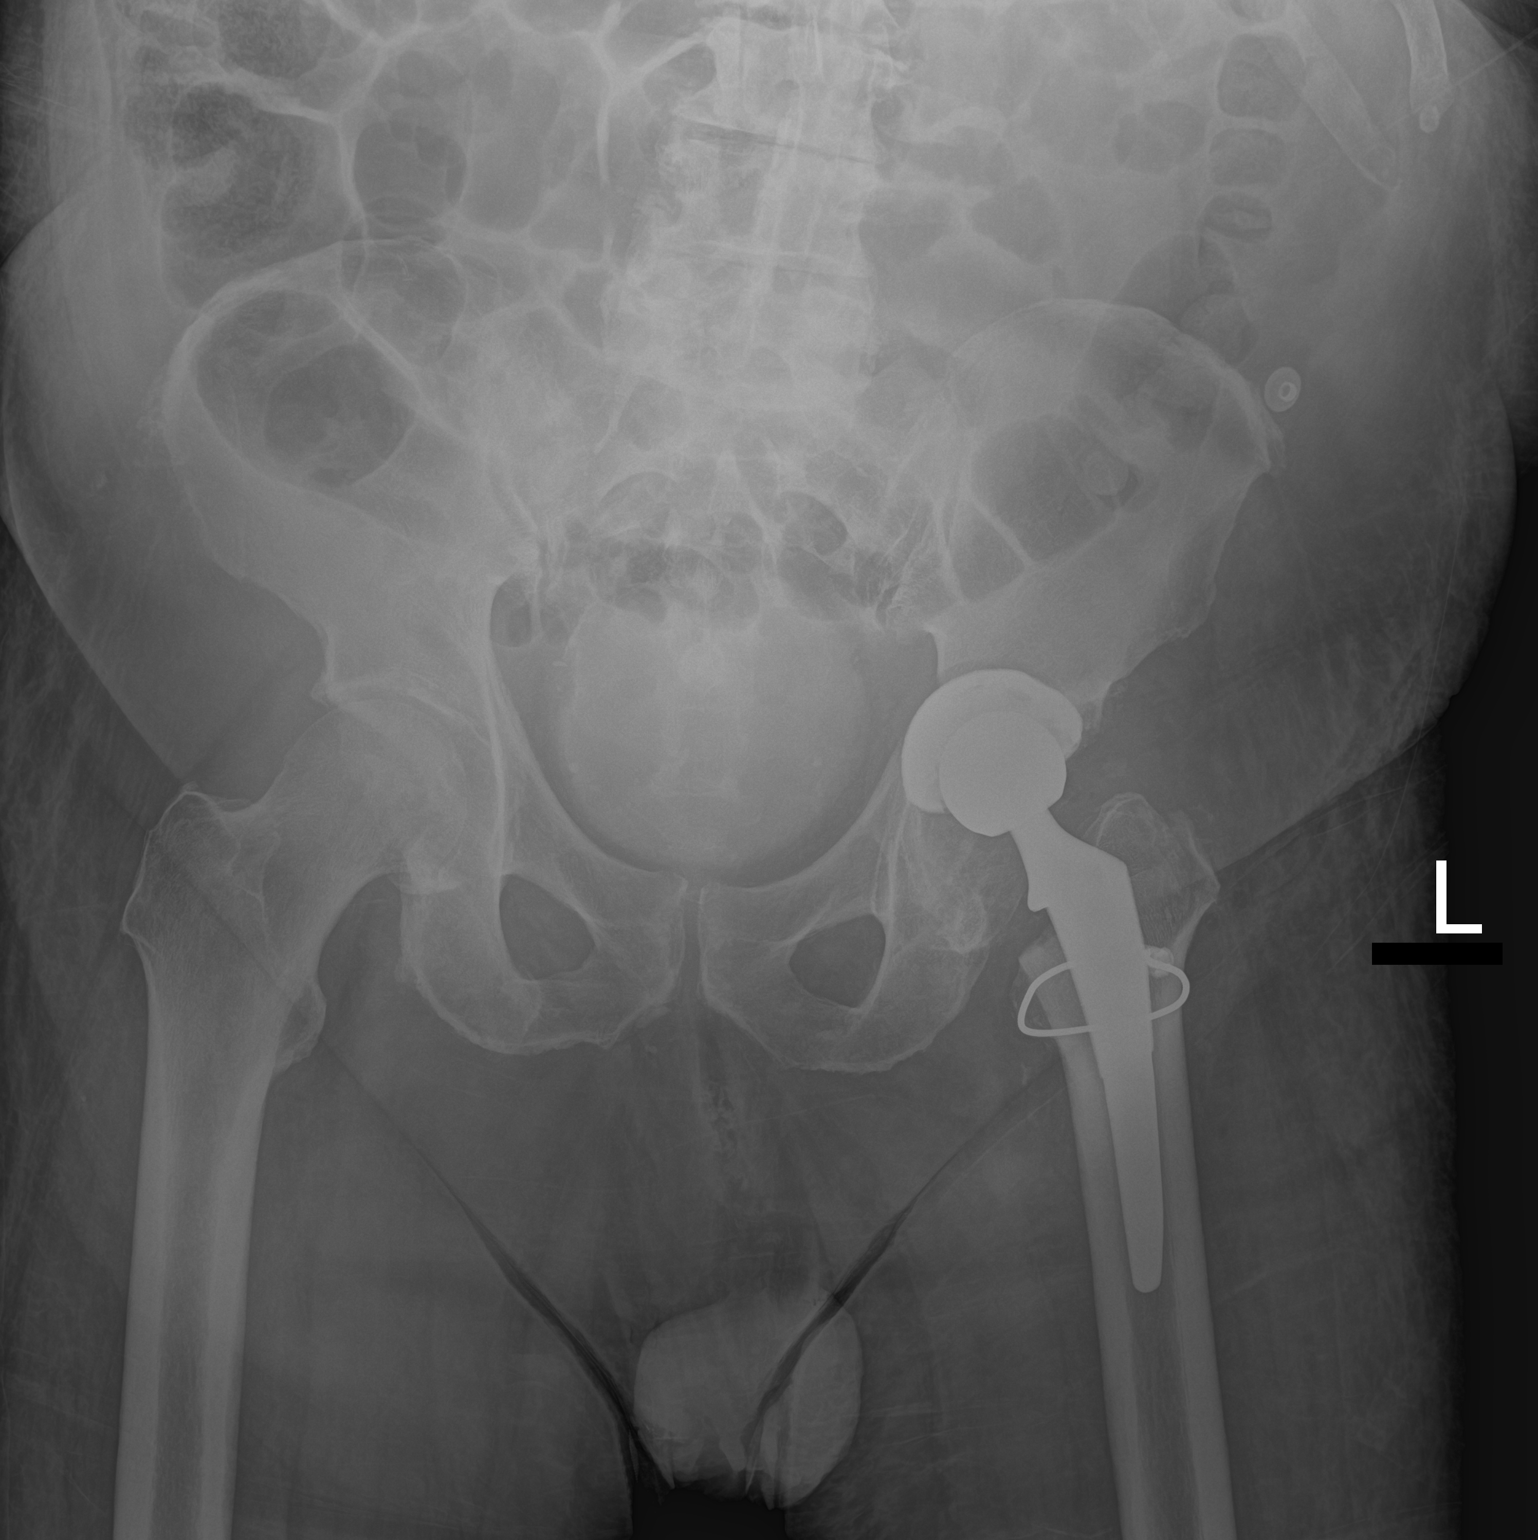

[hip ap]
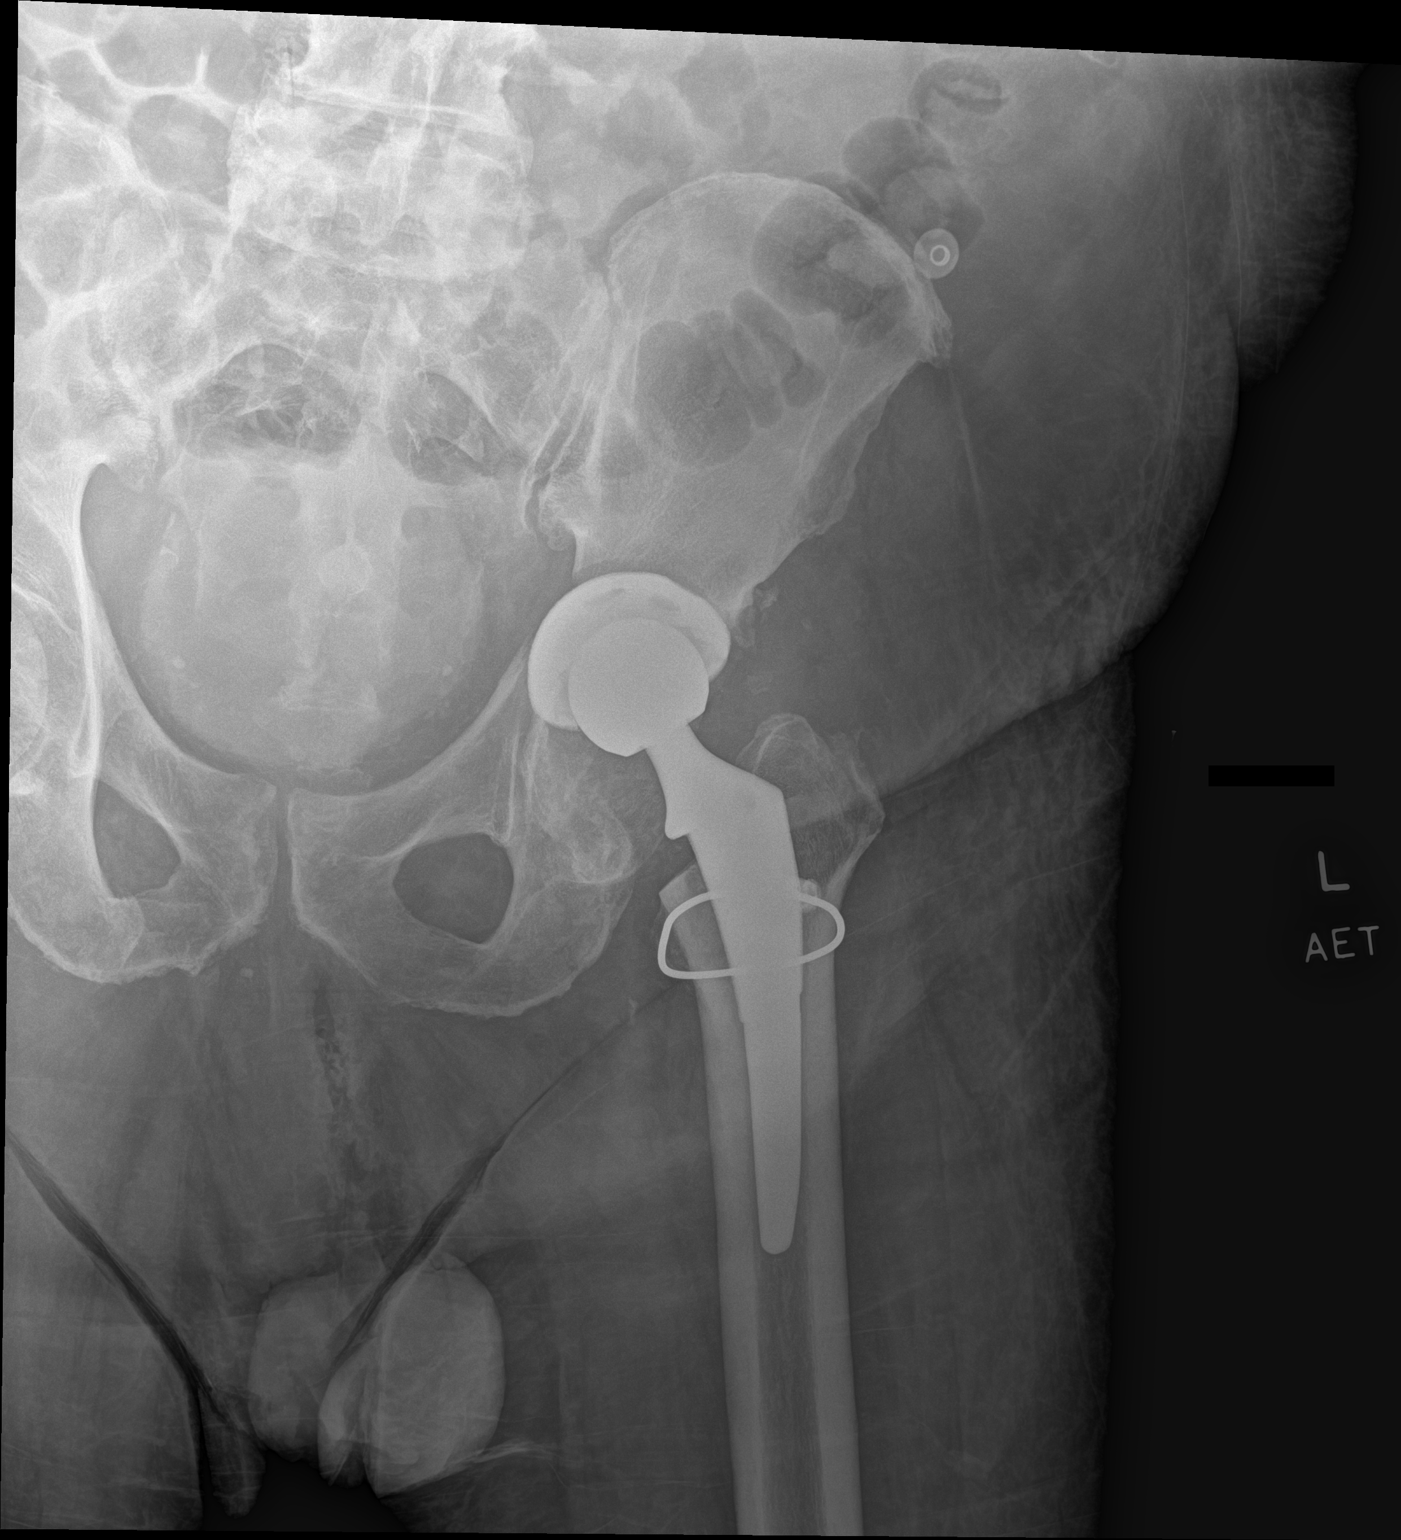

[2 of 2 positions shown; findings below may reference images not displayed]

FINDINGS: Unchanged alignment of the left hip arthroplasty without evidence of
periprosthetic fracture. Retained contrast material in the bladder
from recent CT scan.
IMPRESSION: Unchanged left hip arthroplasty alignment without evidence of
complication.

## 2021-06-08 MED ORDER — ACETAMINOPHEN 650 MG RE SUPP: 650.0000 mg | Freq: Four times a day (QID) | RECTAL | Status: DC | PRN

## 2021-06-08 MED ORDER — DICLOFENAC SODIUM 1 % EX GEL
2.0000 g | Freq: Four times a day (QID) | CUTANEOUS | Status: DC | PRN
  Filled 2021-06-08: qty 100

## 2021-06-08 MED ORDER — ASCORBIC ACID 500 MG PO TABS
500.0000 mg | ORAL_TABLET | Freq: Every day | ORAL | Status: DC
  Administered 2021-06-08 – 2021-06-13 (×6): 500 mg via ORAL
  Filled 2021-06-08 (×6): qty 1

## 2021-06-08 MED ORDER — TIZANIDINE HCL 4 MG PO TABS
2.0000 mg | ORAL_TABLET | Freq: Four times a day (QID) | ORAL | Status: DC | PRN
  Administered 2021-06-12 (×2): 2 mg via ORAL
  Filled 2021-06-08 (×2): qty 1

## 2021-06-08 MED ORDER — ACETAMINOPHEN 325 MG PO TABS
650.0000 mg | ORAL_TABLET | Freq: Four times a day (QID) | ORAL | Status: DC | PRN
  Administered 2021-06-10: 650 mg via ORAL
  Filled 2021-06-08: qty 2

## 2021-06-08 MED ORDER — ALLOPURINOL 300 MG PO TABS
300.0000 mg | ORAL_TABLET | Freq: Every day | ORAL | Status: DC
  Administered 2021-06-08 – 2021-06-13 (×6): 300 mg via ORAL
  Filled 2021-06-08 (×7): qty 1

## 2021-06-08 MED ORDER — ADULT MULTIVITAMIN W/MINERALS CH
1.0000 | ORAL_TABLET | Freq: Every day | ORAL | Status: DC
  Administered 2021-06-08 – 2021-06-13 (×6): 1 via ORAL
  Filled 2021-06-08 (×6): qty 1

## 2021-06-08 MED ORDER — LORATADINE 10 MG PO TABS: 10.0000 mg | ORAL_TABLET | Freq: Every day | ORAL | Status: DC | PRN

## 2021-06-08 MED ORDER — PANTOPRAZOLE SODIUM 40 MG PO TBEC
40.0000 mg | DELAYED_RELEASE_TABLET | Freq: Every day | ORAL | Status: DC
Start: 2021-06-08 — End: 2021-06-14
  Administered 2021-06-08 – 2021-06-13 (×6): 40 mg via ORAL
  Filled 2021-06-08 (×6): qty 1

## 2021-06-08 MED ORDER — TRAZODONE HCL 50 MG PO TABS
50.0000 mg | ORAL_TABLET | Freq: Every evening | ORAL | Status: DC | PRN
  Administered 2021-06-12: 50 mg via ORAL
  Filled 2021-06-08: qty 1

## 2021-06-08 MED ORDER — LOSARTAN POTASSIUM 50 MG PO TABS
50.0000 mg | ORAL_TABLET | Freq: Every day | ORAL | Status: DC
  Administered 2021-06-08 – 2021-06-12 (×5): 50 mg via ORAL
  Filled 2021-06-08 (×5): qty 1

## 2021-06-08 NOTE — Progress Notes (Signed)
PT demonstrated hands on understanding of Flutter device. PC at this time (green, thick).

## 2021-06-08 NOTE — Progress Notes (Addendum)
PATIENT ID: Logan [name]  MRN: [id number]  DOB/AGE:  Jan 01, 1946 / 76 y.o.        PROGRESS NOTE Subjective: Patient is alert, oriented, no Nausea, no Vomiting, yes passing gas, . Taking PO well. Ambulate WBAT with use  of walker Patient reports pain as  moderate .    Objective: Vital signs in last 24 hours: Vitals:   06/08/21 0400 06/08/21 0500 06/08/21 0615 06/08/21 0615  BP: (!) 121/54 115/60  (!) 111/56  Pulse: 76 78  74  Resp: 15 16  14   Temp:    97.7 F (36.5 C)  TempSrc:    Oral  SpO2: 100% 98%  95%  Weight:   88.5 kg   Height:   5\' 7"  (1.702 m)       Intake/Output from previous day: I/O last 3 completed shifts: In: 100 [IV Piggyback:100] Out: 700 [Urine:700]   Intake/Output this shift: No intake/output data recorded.   LABORATORY DATA: Recent Labs    06/05/21 1410 06/06/21 0319 06/07/21 0824 06/08/21 0320  WBC  --    < > 11.3* 10.4  HGB  --    < > 9.7* 8.8*  HCT  --    < > 30.8* 27.7*  PLT  --    < > 281 292  NA  --   --  138  --   K  --   --  3.8  --   CL  --   --  100  --   CO2  --   --  33*  --   BUN  --   --  17  --   CREATININE  --   --  0.62  --   GLUCOSE  --   --  93  --   GLUCAP 131*  --   --   --   CALCIUM  --   --  8.8*  --    < > = values in this interval not displayed.    Examination: Neurologically intact Neurovascular intact Sensation intact distally Intact pulses distally Dorsiflexion/Plantar flexion intact Incision: dressing C/D/I and no drainage No cellulitis present Compartment soft} XR AP&Lat of hip shows well placed\fixed THA  Assessment:      ADDITIONAL DIAGNOSIS:  Expected Acute Blood Loss Anemia,  Diabetes, Cardiac Arrythmia afib, and Sleep Apnea and new PE bilateral and aspiration pneumonia  Plan: PT/OT WBAT, THA.  Ok for ambulation from an ortho standpoint  DVT Prophylaxis: Per medicine team  Hip x-ray ordered, will await results.

## 2021-06-08 NOTE — Hospital Course (Addendum)
Logan [name] is Logan [name] 76 y.o. male with Elwin Tsou history of gout, atrial fibrillation, prediabetes, hypertension, DVT, hyperlipidemia. Patient on Eliquis prior to admission. Patient presented secondary to shortness of breath and was found to have an acute bilateral PE in addition to evidence of aspiration pneumonia. Heparin IV initiated and is now transitioned to back to Eliquis with load.   Planning to discharge to SNF today.  See below for additional details

## 2021-06-08 NOTE — Progress Notes (Signed)
PROGRESS NOTE    Logan [name] DOB: Jul 26, 1945 DOA: 06/07/2021 PCP: [name], MD   Brief Narrative: Logan [name] is a 76 y.o. male with a history of gout, atrial fibrillation, prediabetes, hypertension, DVT, hyperlipidemia. Patient on Eliquis prior to admission. Patient presented secondary to shortness of breath and was found to have an acute bilateral PE in addition to evidence of aspiration pneumonia. Heparin IV initiated and is now transitioned to back to Eliquis with load.   Assessment and Plan: * Acute pulmonary embolism ([place])- (present on admission) Bilateral PEs located in right upper lobe (subsegmental) and left upper lobe (segmental) without evidence of central/saddle embolism or right heart strain on imaging. Started on Heparin IV. Discussed case with hematology who thinks it is unlikely failure of Eliquis since patient was discharged on lower dose post-surgery. -Discontinue heparin IV today -Switch to Eliquis today -PT/OT pending  Aspiration pneumonia ([place]) Noted to be likely on imaging. Patient with productive coughing. Afebrile but with an elevated WBC. Complicated by recent intubation for surgery. Started on Zosyn in the ED. -Continue Unasyn IV -Sputum culture pending -Flutter valve  Acute respiratory failure with hypoxia ([place]) Secondary to acute PE and aspiration pneumonia. Initially hypoxic with documented SpO2 down to 84%. Patient initially required 15 L/min oxygen delivery and has improved. Currently down to 2 L/min of oxygen -Wean to room air as able -Ambulatory pulse ox prior to discharge  H/O total hip arthroplasty, left WBAT LLE. Patient presented [facility] SNF [facility] consulted for likely placement needs  Macrocytic anemia Normal Vitamin B12 and folate from 2022. Baseline hemoglobin of about 12 with post-operative hemoglobin of 9-10. Currently stable. Normal Vitamin B12/folate.  Atrial fibrillation ([place])- (present on admission) Persistent  atrial fibrillation per notes. On Coreg and Eliquis -Continue Coreg -Anticoagulation as mentioned in problem, Acute pulmonary embolism  Gout- (present on admission) -Continue allopurinol  Hypertension- (present on admission) -Continue Coreg and losartan -Hydralazine PRN  Primary osteoarthritis of left hip- (present on admission) Patient is s/p total hip arthroplasty on 06/03/2021     DVT prophylaxis: Heparin IV > Eliquis Code Status:   Code Status: Full Code Family Communication: None at bedside Disposition Plan: Discharge home vs SNF likely in 2-3 days pending ability to wean oxygen, PT/OT recommendations, bed availability if returning to SNF and transition to oral antibiotics.   Consultants:  None  Procedures:  None  Antimicrobials: Unasyn IV    Subjective: Not breathing at baseline but breathing better than yesterday. Significant chest congestion.  Objective: BP (!) 111/56 (BP Location: Right Arm)    Pulse 74    Temp 97.7 F (36.5 C) (Oral)    Resp 14    Ht 5\' 7"  (1.702 m)    Wt 88.5 kg    SpO2 95%    BMI 30.56 kg/m   Examination:  General exam: Appears calm and comfortable Respiratory system: Diffuse rhonchi. Respiratory effort normal. Cardiovascular system: S1 & S2 heard Gastrointestinal system: Abdomen is nondistended, soft and nontender. No organomegaly or masses felt. Normal bowel sounds heard. Central nervous system: Alert and oriented. No focal neurological deficits. Musculoskeletal: No calf tenderness Skin: No cyanosis. No rashes Psychiatry: Judgement and insight appear normal. Mood & affect appropriate.    Data Reviewed: I have personally reviewed following labs and imaging studies  CBC Lab Results  Component Value Date   WBC 10.4 06/08/2021   RBC 2.75 (L) 06/08/2021   HGB 8.8 (L) 06/08/2021   HCT 27.7 (L) 06/08/2021   MCV 100.7 (  H) 06/08/2021   MCH 32.0 06/08/2021   PLT 292 06/08/2021   MCHC 31.8 06/08/2021   RDW 15.2 06/08/2021    LYMPHSABS 1.8 06/07/2021   MONOABS 0.8 06/07/2021   EOSABS 0.3 06/07/2021   BASOSABS 0.0 60/07/5995     Last metabolic panel Lab Results  Component Value Date   NA 138 06/07/2021   K 3.8 06/07/2021   CL 100 06/07/2021   CO2 33 (H) 06/07/2021   BUN 17 06/07/2021   CREATININE 0.62 06/07/2021   GLUCOSE 93 06/07/2021   GFRNONAA >60 06/07/2021   GFRAA 93 05/21/2020   CALCIUM 8.8 (L) 06/07/2021   PROT 5.8 (L) 06/07/2021   ALBUMIN 2.8 (L) 06/07/2021   BILITOT 1.3 (H) 06/07/2021   ALKPHOS 55 06/07/2021   AST 22 06/07/2021   ALT 19 06/07/2021   ANIONGAP 5 06/07/2021    GFR: Estimated Creatinine Clearance: 87.4 mL/min (by C-G formula based on SCr of 0.62 mg/dL).  Recent Results (from the past 240 hour(s))  SARS CORONAVIRUS 2 (TAT 6-24 HRS) Nasopharyngeal Nasopharyngeal Swab     Status: None   Collection Time: 05/30/21  7:13 AM   Specimen: Nasopharyngeal Swab  Result Value Ref Range Status   SARS Coronavirus 2 NEGATIVE NEGATIVE Final    Comment: (NOTE) SARS-CoV-2 target nucleic acids are NOT DETECTED.  The SARS-CoV-2 RNA is generally detectable in upper and lower respiratory specimens during the acute phase of infection. Negative results do not preclude SARS-CoV-2 infection, do not rule out co-infections with other pathogens, and should not be used as the sole basis for treatment or other patient management decisions. Negative results must be combined with clinical observations, patient history, and epidemiological information. The expected result is Negative.  Fact Sheet for Patients: SugarRoll.be  Fact Sheet for Healthcare Providers: https://www.woods-mathews.com/  This test is not yet approved or cleared by the [place] FDA and  has been authorized for detection and/or diagnosis of SARS-CoV-2 by FDA under an Emergency Use Authorization (EUA). This EUA will remain  in effect (meaning this test can be used) for the duration  of the COVID-19 declaration under Se ction 564(b)(1) of the Act, 21 U.S.C. section 360bbb-3(b)(1), unless the authorization is terminated or revoked sooner.  Performed at [facility], [place] [address] [place], [place] [address]   Resp Panel by RT-PCR (Flu A&B, Covid) Nasopharyngeal Swab     Status: None   Collection Time: 06/05/21 12:49 PM   Specimen: Nasopharyngeal Swab; Nasopharyngeal(NP) swabs in vial transport medium  Result Value Ref Range Status   SARS Coronavirus 2 by RT PCR NEGATIVE NEGATIVE Final    Comment: (NOTE) SARS-CoV-2 target nucleic acids are NOT DETECTED.  The SARS-CoV-2 RNA is generally detectable in upper respiratory specimens during the acute phase of infection. The lowest concentration of SARS-CoV-2 viral copies this assay can detect is 138 copies/mL. A negative result does not preclude SARS-Cov-2 infection and should not be used as the sole basis for treatment or other patient management decisions. A negative result may occur with  improper specimen collection/handling, submission of specimen other than nasopharyngeal swab, presence of viral mutation(s) within the areas targeted by this assay, and inadequate number of viral copies(<138 copies/mL). A negative result must be combined with clinical observations, patient history, and epidemiological information. The expected result is Negative.  Fact Sheet for Patients:  EntrepreneurPulse.com.au  Fact Sheet for Healthcare Providers:  IncredibleEmployment.be  This test is no t yet approved or cleared by the [place] and  has been authorized for detection and/or diagnosis of SARS-CoV-2 by FDA under an Emergency Use Authorization (EUA). This EUA will remain  in effect (meaning this test can be used) for the duration of the COVID-19 declaration under Section 564(b)(1) of the Act, 21 U.S.C.section 360bbb-3(b)(1), unless the authorization is terminated  or revoked  sooner.       Influenza A by PCR NEGATIVE NEGATIVE Final   Influenza B by PCR NEGATIVE NEGATIVE Final    Comment: (NOTE) The Xpert Xpress SARS-CoV-2/FLU/RSV plus assay is intended as an aid in the diagnosis of influenza from Nasopharyngeal swab specimens and should not be used as a sole basis for treatment. Nasal washings and aspirates are unacceptable for Xpert Xpress SARS-CoV-2/FLU/RSV testing.  Fact Sheet for Patients: EntrepreneurPulse.com.au  Fact Sheet for Healthcare Providers: IncredibleEmployment.be  This test is not yet approved or cleared by the [place] FDA and has been authorized for detection and/or diagnosis of SARS-CoV-2 by FDA under an Emergency Use Authorization (EUA). This EUA will remain in effect (meaning this test can be used) for the duration of the COVID-19 declaration under Section 564(b)(1) of the Act, 21 U.S.C. section 360bbb-3(b)(1), unless the authorization is terminated or revoked.  Performed at [facility], [place] [address], [place], [place] [address]   Resp Panel by RT-PCR (Flu A&B, Covid) Nasopharyngeal Swab     Status: None   Collection Time: 06/07/21  8:24 AM   Specimen: Nasopharyngeal Swab; Nasopharyngeal(NP) swabs in vial transport medium  Result Value Ref Range Status   SARS Coronavirus 2 by RT PCR NEGATIVE NEGATIVE Final    Comment: (NOTE) SARS-CoV-2 target nucleic acids are NOT DETECTED.  The SARS-CoV-2 RNA is generally detectable in upper respiratory specimens during the acute phase of infection. The lowest concentration of SARS-CoV-2 viral copies this assay can detect is 138 copies/mL. A negative result does not preclude SARS-Cov-2 infection and should not be used as the sole basis for treatment or other patient management decisions. A negative result may occur with  improper specimen collection/handling, submission of specimen other than nasopharyngeal swab, presence of  viral mutation(s) within the areas targeted by this assay, and inadequate number of viral copies(<138 copies/mL). A negative result must be combined with clinical observations, patient history, and epidemiological information. The expected result is Negative.  Fact Sheet for Patients:  EntrepreneurPulse.com.au  Fact Sheet for Healthcare Providers:  IncredibleEmployment.be  This test is no t yet approved or cleared by the [place] FDA and  has been authorized for detection and/or diagnosis of SARS-CoV-2 by FDA under an Emergency Use Authorization (EUA). This EUA will remain  in effect (meaning this test can be used) for the duration of the COVID-19 declaration under Section 564(b)(1) of the Act, 21 U.S.C.section 360bbb-3(b)(1), unless the authorization is terminated  or revoked sooner.       Influenza A by PCR NEGATIVE NEGATIVE Final   Influenza B by PCR NEGATIVE NEGATIVE Final    Comment: (NOTE) The Xpert Xpress SARS-CoV-2/FLU/RSV plus assay is intended as an aid in the diagnosis of influenza from Nasopharyngeal swab specimens and should not be used as a sole basis for treatment. Nasal washings and aspirates are unacceptable for Xpert Xpress SARS-CoV-2/FLU/RSV testing.  Fact Sheet for Patients: EntrepreneurPulse.com.au  Fact Sheet for Healthcare Providers: IncredibleEmployment.be  This test is not yet approved or cleared by the [place] FDA and has been authorized for detection and/or diagnosis of SARS-CoV-2 by FDA under an Emergency Use Authorization (EUA). This EUA will remain  in effect (meaning this test can be used) for the duration of the COVID-19 declaration under Section 564(b)(1) of the Act, 21 U.S.C. section 360bbb-3(b)(1), unless the authorization is terminated or revoked.  Performed at [facility], [place] [address], [place], [place] [address]   Expectorated  Sputum Assessment w Gram Stain, Rflx to Resp Cult     Status: None   Collection Time: 06/07/21  5:42 PM   Specimen: Expectorated Sputum  Result Value Ref Range Status   Specimen Description EXPECTORATED SPUTUM  Final   Special Requests NONE  Final   Sputum evaluation   Final    THIS SPECIMEN IS ACCEPTABLE FOR SPUTUM CULTURE Performed at [facility], [place] [address] [place], [place] [address]    Report Status 06/07/2021 FINAL  Final      Radiology Studies: CT Angio Chest PE W and/or Wo Contrast  Result Date: 06/07/2021 CLINICAL DATA:  Pulmonary embolism (PE) suspected, high prob EXAM: CT ANGIOGRAPHY CHEST WITH CONTRAST TECHNIQUE: Multidetector CT imaging of the chest was performed using the standard protocol during bolus administration of intravenous contrast. Multiplanar CT image reconstructions and MIPs were obtained to evaluate the vascular anatomy. RADIATION DOSE REDUCTION: This exam was performed according to the departmental dose-optimization program which includes automated exposure control, adjustment of the mA and/or kV according to patient size and/or use of iterative reconstruction technique. CONTRAST:  150mL OMNIPAQUE IOHEXOL 350 MG/ML SOLN COMPARISON:  Radiograph 06/07/2021, right shoulder CT 01/15/2021 FINDINGS: Cardiovascular: Mild cardiomegaly. No pericardial disease. Coronary artery calcifications. Normal size main and branch pulmonary arteries. There is a right upper lobe subsegmental pulmonary embolism and a left upper lobe segmental and subsegmental pulmonary embolism (series 5, images 100 and 110-111). No central/saddle embolism. No evidence of right heart strain (RV: LV ratio is 0.8). There is moderate atherosclerotic calcifications of the aortic arch and descending aorta. Mediastinum/Nodes: No lymphadenopathy.The thyroid is unremarkable. The esophagus is unremarkable. Lungs/Pleura: There is endobronchial material within the left mainstem and lower lobe  bronchus, with near complete left lower lobe collapse and areas of decreased density within the collapsed left lower lobe consistent with pneumonia. Trace respiratory secretions in the trachea.Right basilar atelectasis. Lingular subsegmental atelectasis. Small left and trace right pleural effusions. No pneumothorax. Upper Abdomen: Calcified hepatic granulomas. Musculoskeletal: There are subacute left posterior ninth, tenth, and eleventh rib fractures near the costovertebral junction.Anterior superior right glenohumeral dislocation and fragmented fracture of the coracoid. Dislocation was present on prior CT in September 22, but the fracture of the coracoid is new since that exam. There is severe left and mild-to-moderate right [place] joint arthritis with slight posterior subluxation of right medial clavicle with respect to the manubrium. Review of the MIP images confirms the above findings. IMPRESSION: Right upper lobe subsegmental and left upper lobe segmental and subsegmental pulmonary emboli. No central/saddle embolism. No CT evidence of right heart strain. Endobronchial material within the left mainstem and left lower lobe bronchus, with near complete left lower lobe collapse and findings suggestive of left lower lobe aspiration pneumonia. Small left and trace right pleural effusions. Right basilar and lingular atelectasis. Subacute left posterior ninth, tenth, and eleventh rib fractures near the costovertebral junction. Anterior superior right glenohumeral dislocation with fragmented fracture of the coracoid. The right shoulder was dislocated on prior CT in September 22, but the coracoid fracture is new. Aortic Atherosclerosis (ICD10-I70.0). Critical Value/emergent results were called by telephone at the time of interpretation on 06/07/2021 at 10:06 am to provider taking care of the  patient, who verbally acknowledged these results. Electronically Signed   By: Maurine Simmering M.D.   On: 06/07/2021 10:21   DG Chest  Portable 1 View  Result Date: 06/07/2021 CLINICAL DATA:  Hypoxia, shortness of breath. EXAM: PORTABLE CHEST 1 VIEW COMPARISON:  May 27, 2021. FINDINGS: The heart size and mediastinal contours are within normal limits. Hypoinflation of the lungs is noted with mild bibasilar subsegmental atelectasis. The visualized skeletal structures are unremarkable. IMPRESSION: Hypoinflation of the lungs with mild bibasilar subsegmental atelectasis. Electronically Signed   By: Marijo Conception M.D.   On: 06/07/2021 08:51   DG HIP UNILAT WITH PELVIS 1V LEFT  Result Date: 06/08/2021 CLINICAL DATA:  Total hip arthroplasty EXAM: DG HIP (WITH OR WITHOUT PELVIS) 1V*L* COMPARISON:  Radiograph 06/06/2021 FINDINGS: Unchanged alignment of the left hip arthroplasty without evidence of periprosthetic fracture. Retained contrast material in the bladder from recent CT scan. IMPRESSION: Unchanged left hip arthroplasty alignment without evidence of complication. Electronically Signed   By: Maurine Simmering M.D.   On: 06/08/2021 09:08   VAS [place] LOWER EXTREMITY VENOUS (DVT) (ONLY [facility] & [facility])  Result Date: 06/07/2021  Lower Venous DVT Study Patient Name:  ASBURY HAIR  Date of Exam:   06/07/2021 Medical Rec #: [id number]    Accession #:    [id number] Date of Birth: Sep 11, 1945    Patient Gender: M Patient Age:   76 years Exam Location:  [facility] Procedure:      VAS [place] LOWER EXTREMITY VENOUS (DVT) Referring Phys: Marda Stalker --------------------------------------------------------------------------------  Indications: Pain.  Risk Factors: None identified. Limitations: Poor ultrasound/tissue interface. Comparison Study: No prior studies. Performing Technologist: Oliver Hum RVT  Examination Guidelines: A complete evaluation includes B-mode imaging, spectral Doppler, color Doppler, and power Doppler as needed of all accessible portions of each vessel. Bilateral testing is considered an integral part of a complete examination.  Limited examinations for reoccurring indications may be performed as noted. The reflux portion of the exam is performed with the patient in reverse Trendelenburg.  +---------+---------------+---------+-----------+----------+--------------+  RIGHT     Compressibility Phasicity Spontaneity Properties Thrombus Aging  +---------+---------------+---------+-----------+----------+--------------+  CFV       Full            Yes       Yes                                    +---------+---------------+---------+-----------+----------+--------------+  SFJ       Full                                                             +---------+---------------+---------+-----------+----------+--------------+  FV Prox   Full                                                             +---------+---------------+---------+-----------+----------+--------------+  FV Mid    Full                                                             +---------+---------------+---------+-----------+----------+--------------+  FV Distal Full                                                             +---------+---------------+---------+-----------+----------+--------------+  PFV       Full                                                             +---------+---------------+---------+-----------+----------+--------------+  POP       Full            Yes       Yes                                    +---------+---------------+---------+-----------+----------+--------------+  PTV       Full                                                             +---------+---------------+---------+-----------+----------+--------------+  PERO      Full                                                             +---------+---------------+---------+-----------+----------+--------------+   +---------+---------------+---------+-----------+----------+--------------+  LEFT      Compressibility Phasicity Spontaneity Properties Thrombus Aging   +---------+---------------+---------+-----------+----------+--------------+  CFV       Full            Yes       Yes                                    +---------+---------------+---------+-----------+----------+--------------+  SFJ       Full                                                             +---------+---------------+---------+-----------+----------+--------------+  FV Prox   Full                                                             +---------+---------------+---------+-----------+----------+--------------+  FV Mid    Full                                                             +---------+---------------+---------+-----------+----------+--------------+  FV Distal Full                                                             +---------+---------------+---------+-----------+----------+--------------+  PFV       Full                                                             +---------+---------------+---------+-----------+----------+--------------+  POP       Full            Yes       Yes                                    +---------+---------------+---------+-----------+----------+--------------+  PTV       Full                                                             +---------+---------------+---------+-----------+----------+--------------+  PERO      Full                                                             +---------+---------------+---------+-----------+----------+--------------+    Summary: RIGHT: - There is no evidence of deep vein thrombosis in the lower extremity.  - No cystic structure found in the popliteal fossa.  LEFT: - There is no evidence of deep vein thrombosis in the lower extremity.  - No cystic structure found in the popliteal fossa.  *See table(s) above for measurements and observations.    Preliminary       LOS: 0 days    Cordelia Poche, MD [facility] 06/08/2021, 11:00 AM   If 7PM-7AM, please contact night-coverage [email or web address]

## 2021-06-08 NOTE — Progress Notes (Signed)
[place] for IV Heparin (apixaban PTA) > Eliquis Indication: new B PE, hx DVT  No Known Allergies  Patient Measurements: Height: 5\' 7"  (170.2 cm) Weight: 72.1 kg (159 lb) IBW/kg (Calculated) : 66.1 Heparin Dosing Weight: 72 kg  Vital Signs: Temp: 98.7 F (37.1 C) (02/17 2138) Temp Source: Oral (02/17 2138) BP: 127/87 (02/17 2138) Pulse Rate: 83 (02/17 2138)  Labs: Recent Labs    06/05/21 0331 06/06/21 0319 06/07/21 0824 06/07/21 1024 06/07/21 1120 06/07/21 2122  HGB 9.0* 8.7* 9.7*  --   --   --   HCT 27.9* 27.2* 30.8*  --   --   --   PLT 218 224 281  --   --   --   APTT  --   --   --   --  34 49*  HEPARINUNFRC  --   --   --   --  1.09* >1.10*  CREATININE  --   --  0.62  --   --   --   TROPONINIHS  --   --  6 5  --   --      Estimated Creatinine Clearance: 76.9 mL/min (by C-G formula based on SCr of 0.62 mg/dL).   Medical History: Past Medical History:  Diagnosis Date   Anxiety    Atherosclerotic renal artery stenosis, unilateral (HCC) 02/28/2014   Atrial fibrillation (HCC)    Bilateral lower extremity edema    Chronic diastolic heart failure ([place]) 10/21/2019   Diabetes mellitus without complication (HCC)    DVT (deep venous thrombosis) (HCC)    Dysrhythmia    Esophageal reflux    Essential hypertension 10/21/2019   Gout    Hyperlipidemia    Hypertension    Mixed hyperlipidemia 10/21/2019   Morbid obesity (HCC)    Obesity (BMI 30-39.9) 08/24/2019   Orthopnea    Osteoarthritis    Persistent atrial fibrillation ([place]) 10/21/2019   Pneumonia    Seasonal allergies    Sleep apnea     Medications:  Scheduled:  Infusions:  PRN:   Assessment: 76 yo male presented with shortness of breath found to have new bilateral PE with no heart strain. Patient was on apixaban PTA for hx DVT and patient just had R THA on 2/13. Patient was informed by cardiology to hold Eliquis starting 3 days prior to ortho surgery which would  have been 2/10. POD1, patient was started on 2.5mg  BID and that was continued until discharge on 2/16. Patient then came back [facility] ER on 2/17 AM with shortness of breath. After discussion with ER Md and [facility], plan is to start IV heparin for time being while final plan is formulated. Baseline CBC drawn, Nurse said she was going to draw a heparin level and aPTT as well.   PM UPDATE: - MD d/w Heme and new bilateral PE is not thought to be due to Eliquis failure since patient was discharged with reduced dose of Eliquis post-op - Pharmacy has been consulted to switch heparin drip to Eliquis load and maintenance dosing starting 2/18 - d/w MD, he would like to continue IV heparin through tonight since patient has been hypoxic  06/08/21  aPTT = 49 sec - subtherapeutic (drawn 2/17 @ 21:22 and resulted 2/18 @ 00:25) HL > 1.1 (falsely elevated due to effects of apixaban) Heparin gtt infusing @ 1200 units/hr No complications of therapy noted  Goal of Therapy:  Heparin level 0.3-0.7 units/ml aPTT 66-102 seconds Monitor  platelets by anticoagulation protocol: Yes   Plan:  Increase IV heparin to 1350 units/hr Will not order 8 hr aPTT due to heparin being d/c'ed in AM when Eliquis begins Daily CBC Start Eliquis 10mg  PO BID x 7 days on 2/18, followed by Eliquis 5mg  PO BID thereafter  Discontinue heparin drip on 2/18 at time of Eliquis administration  Leone Haven, PharmD 06/08/2021 12:33 AM

## 2021-06-08 NOTE — Progress Notes (Incomplete)
PROGRESS NOTE    Logan [name]  CWC:[id number] DOB: 22-Oct-1945 DOA: 06/07/2021 PCP: [name], MD   Brief Narrative: Logan [name] is a 76 y.o. male with a history of gout, atrial fibrillation, prediabetes, hypertension, DVT, hyperlipidemia. Patient presented secondary to shortness of breath and was found to have an acute bilateral PE in addition to evidence of aspiration pneumonia.   Assessment and Plan: * Acute pulmonary embolism ([place])- (present on admission) Bilateral PEs located in right upper lobe (subsegmental) and left upper lobe (segmental) without evidence of central/saddle embolism or right heart strain on imaging. Started on Heparin IV. Discussed case with hematology who thinks it is unlikely failure of Eliquis since patient was discharged on lower dose post-surgery. -Discontinue heparin IV today -Switch to Eliquis today -PT/OT pending  Aspiration pneumonia ([place]) Noted to be likely on imaging. Patient with productive coughing. Afebrile but with an elevated WBC. Complicated by recent intubation for surgery. Started on Zosyn in the ED. -Continue Unasyn IV -Sputum culture pending  Acute respiratory failure with hypoxia ([place]) Secondary to acute PE and aspiration pneumonia. Initially hypoxic with documented SpO2 down to 84%. Patient initially required 15 L/min oxygen delivery and has improved. Currently down to 2 L/min of oxygen -Wean to room air as able -Ambulatory pulse ox prior to discharge  H/O total hip arthroplasty, left WBAT LLE. Patient [facility] consulted for likely placement needs  Macrocytic anemia Normal Vitamin B12 and folate from 2022. Baseline hemoglobin of about 12 with post-operative hemoglobin of 9-10. Currently stable. Normal Vitamin B12/folate.  Atrial fibrillation ([place])- (present on admission) Persistent atrial fibrillation per notes. On Coreg and Eliquis -Continue Coreg -Anticoagulation as mentioned in problem, Acute pulmonary  embolism  Gout- (present on admission) -Continue allopurinol  Hypertension- (present on admission) -Continue Coreg and losartan -Hydralazine PRN  Primary osteoarthritis of left hip- (present on admission) Patient is s/p total hip arthroplasty on 06/03/2021     DVT prophylaxis: Heparin IV > Eliquis Code Status:   Code Status: Full Code Family Communication: None at bedside Disposition Plan: ***   Consultants:  ***  Procedures:  ***  Antimicrobials: ***    Subjective: ***  Objective: BP (!) 111/56 (BP Location: Right Arm)    Pulse 74    Temp 97.7 F (36.5 C) (Oral)    Resp 14    Ht 5\' 7"  (1.702 m)    Wt 88.5 kg    SpO2 95%    BMI 30.56 kg/m   Examination:  General exam: Appears calm and comfortable *** Respiratory system: Clear to auscultation. Respiratory effort normal. Cardiovascular system: S1 & S2 heard, RRR. No murmurs, rubs, gallops or clicks. Gastrointestinal system: Abdomen is nondistended, soft and nontender. No organomegaly or masses felt. Normal bowel sounds heard. Central nervous system: Alert and oriented. No focal neurological deficits. Musculoskeletal: No edema. No calf tenderness Skin: No cyanosis. No rashes Psychiatry: Judgement and insight appear normal. Mood & affect appropriate.    Data Reviewed: I have personally reviewed following labs and imaging studies  CBC Lab Results  Component Value Date   WBC 10.4 06/08/2021   RBC 2.75 (L) 06/08/2021   HGB 8.8 (L) 06/08/2021   HCT 27.7 (L) 06/08/2021   MCV 100.7 (H) 06/08/2021   MCH 32.0 06/08/2021   PLT 292 06/08/2021   MCHC 31.8 06/08/2021   RDW 15.2 06/08/2021   LYMPHSABS 1.8 06/07/2021   MONOABS 0.8 06/07/2021   EOSABS 0.3 06/07/2021   BASOSABS 0.0 06/07/2021     Last  metabolic panel Lab Results  Component Value Date   NA 138 06/07/2021   K 3.8 06/07/2021   CL 100 06/07/2021   CO2 33 (H) 06/07/2021   BUN 17 06/07/2021   CREATININE 0.62 06/07/2021   GLUCOSE 93 06/07/2021    GFRNONAA >60 06/07/2021   GFRAA 93 05/21/2020   CALCIUM 8.8 (L) 06/07/2021   PROT 5.8 (L) 06/07/2021   ALBUMIN 2.8 (L) 06/07/2021   BILITOT 1.3 (H) 06/07/2021   ALKPHOS 55 06/07/2021   AST 22 06/07/2021   ALT 19 06/07/2021   ANIONGAP 5 06/07/2021    GFR: Estimated Creatinine Clearance: 87.4 mL/min (by C-G formula based on SCr of 0.62 mg/dL).  Recent Results (from the past 240 hour(s))  SARS CORONAVIRUS 2 (TAT 6-24 HRS) Nasopharyngeal Nasopharyngeal Swab     Status: None   Collection Time: 05/30/21  7:13 AM   Specimen: Nasopharyngeal Swab  Result Value Ref Range Status   SARS Coronavirus 2 NEGATIVE NEGATIVE Final    Comment: (NOTE) SARS-CoV-2 target nucleic acids are NOT DETECTED.  The SARS-CoV-2 RNA is generally detectable in upper and lower respiratory specimens during the acute phase of infection. Negative results do not preclude SARS-CoV-2 infection, do not rule out co-infections with other pathogens, and should not be used as the sole basis for treatment or other patient management decisions. Negative results must be combined with clinical observations, patient history, and epidemiological information. The expected result is Negative.  Fact Sheet for Patients: SugarRoll.be  Fact Sheet for Healthcare Providers: https://www.woods-mathews.com/  This test is not yet approved or cleared by the [place] FDA and  has been authorized for detection and/or diagnosis of SARS-CoV-2 by FDA under an Emergency Use Authorization (EUA). This EUA will remain  in effect (meaning this test can be used) for the duration of the COVID-19 declaration under Se ction 564(b)(1) of the Act, 21 U.S.C. section 360bbb-3(b)(1), unless the authorization is terminated or revoked sooner.  Performed at [facility], [place] [address] [place], [place] [address]   Resp Panel by RT-PCR (Flu A&B, Covid) Nasopharyngeal Swab     Status: None   Collection  Time: 06/05/21 12:49 PM   Specimen: Nasopharyngeal Swab; Nasopharyngeal(NP) swabs in vial transport medium  Result Value Ref Range Status   SARS Coronavirus 2 by RT PCR NEGATIVE NEGATIVE Final    Comment: (NOTE) SARS-CoV-2 target nucleic acids are NOT DETECTED.  The SARS-CoV-2 RNA is generally detectable in upper respiratory specimens during the acute phase of infection. The lowest concentration of SARS-CoV-2 viral copies this assay can detect is 138 copies/mL. A negative result does not preclude SARS-Cov-2 infection and should not be used as the sole basis for treatment or other patient management decisions. A negative result may occur with  improper specimen collection/handling, submission of specimen other than nasopharyngeal swab, presence of viral mutation(s) within the areas targeted by this assay, and inadequate number of viral copies(<138 copies/mL). A negative result must be combined with clinical observations, patient history, and epidemiological information. The expected result is Negative.  Fact Sheet for Patients:  EntrepreneurPulse.com.au  Fact Sheet for Healthcare Providers:  IncredibleEmployment.be  This test is no t yet approved or cleared by the [place] FDA and  has been authorized for detection and/or diagnosis of SARS-CoV-2 by FDA under an Emergency Use Authorization (EUA). This EUA will remain  in effect (meaning this test can be used) for the duration of the COVID-19 declaration under Section 564(b)(1) of the Act, 21 U.S.C.section 360bbb-3(b)(1), unless  the authorization is terminated  or revoked sooner.       Influenza A by PCR NEGATIVE NEGATIVE Final   Influenza B by PCR NEGATIVE NEGATIVE Final    Comment: (NOTE) The Xpert Xpress SARS-CoV-2/FLU/RSV plus assay is intended as an aid in the diagnosis of influenza from Nasopharyngeal swab specimens and should not be used as a sole basis for treatment. Nasal washings  and aspirates are unacceptable for Xpert Xpress SARS-CoV-2/FLU/RSV testing.  Fact Sheet for Patients: EntrepreneurPulse.com.au  Fact Sheet for Healthcare Providers: IncredibleEmployment.be  This test is not yet approved or cleared by the [place] FDA and has been authorized for detection and/or diagnosis of SARS-CoV-2 by FDA under an Emergency Use Authorization (EUA). This EUA will remain in effect (meaning this test can be used) for the duration of the COVID-19 declaration under Section 564(b)(1) of the Act, 21 U.S.C. section 360bbb-3(b)(1), unless the authorization is terminated or revoked.  Performed at [facility], [place] [address] [place], [place] [address]   Resp Panel by RT-PCR (Flu A&B, Covid) Nasopharyngeal Swab     Status: None   Collection Time: 06/07/21  8:24 AM   Specimen: Nasopharyngeal Swab; Nasopharyngeal(NP) swabs in vial transport medium  Result Value Ref Range Status   SARS Coronavirus 2 by RT PCR NEGATIVE NEGATIVE Final    Comment: (NOTE) SARS-CoV-2 target nucleic acids are NOT DETECTED.  The SARS-CoV-2 RNA is generally detectable in upper respiratory specimens during the acute phase of infection. The lowest concentration of SARS-CoV-2 viral copies this assay can detect is 138 copies/mL. A negative result does not preclude SARS-Cov-2 infection and should not be used as the sole basis for treatment or other patient management decisions. A negative result may occur with  improper specimen collection/handling, submission of specimen other than nasopharyngeal swab, presence of viral mutation(s) within the areas targeted by this assay, and inadequate number of viral copies(<138 copies/mL). A negative result must be combined with clinical observations, patient history, and epidemiological information. The expected result is Negative.  Fact Sheet for Patients:   EntrepreneurPulse.com.au  Fact Sheet for Healthcare Providers:  IncredibleEmployment.be  This test is no t yet approved or cleared by the [place] FDA and  has been authorized for detection and/or diagnosis of SARS-CoV-2 by FDA under an Emergency Use Authorization (EUA). This EUA will remain  in effect (meaning this test can be used) for the duration of the COVID-19 declaration under Section 564(b)(1) of the Act, 21 U.S.C.section 360bbb-3(b)(1), unless the authorization is terminated  or revoked sooner.       Influenza A by PCR NEGATIVE NEGATIVE Final   Influenza B by PCR NEGATIVE NEGATIVE Final    Comment: (NOTE) The Xpert Xpress SARS-CoV-2/FLU/RSV plus assay is intended as an aid in the diagnosis of influenza from Nasopharyngeal swab specimens and should not be used as a sole basis for treatment. Nasal washings and aspirates are unacceptable for Xpert Xpress SARS-CoV-2/FLU/RSV testing.  Fact Sheet for Patients: EntrepreneurPulse.com.au  Fact Sheet for Healthcare Providers: IncredibleEmployment.be  This test is not yet approved or cleared by the [place] FDA and has been authorized for detection and/or diagnosis of SARS-CoV-2 by FDA under an Emergency Use Authorization (EUA). This EUA will remain in effect (meaning this test can be used) for the duration of the COVID-19 declaration under Section 564(b)(1) of the Act, 21 U.S.C. section 360bbb-3(b)(1), unless the authorization is terminated or revoked.  Performed at [facility], [place] [address], [place], [place] [address]  Expectorated Sputum Assessment w Gram Stain, Rflx to Resp Cult     Status: None   Collection Time: 06/07/21  5:42 PM   Specimen: Expectorated Sputum  Result Value Ref Range Status   Specimen Description EXPECTORATED SPUTUM  Final   Special Requests NONE  Final   Sputum evaluation   Final    THIS  SPECIMEN IS ACCEPTABLE FOR SPUTUM CULTURE Performed at [facility], [place] [address], [place], [place] [address]    Report Status 06/07/2021 FINAL  Final      Radiology Studies: CT Angio Chest PE W and/or Wo Contrast  Result Date: 06/07/2021 CLINICAL DATA:  Pulmonary embolism (PE) suspected, high prob EXAM: CT ANGIOGRAPHY CHEST WITH CONTRAST TECHNIQUE: Multidetector CT imaging of the chest was performed using the standard protocol during bolus administration of intravenous contrast. Multiplanar CT image reconstructions and MIPs were obtained to evaluate the vascular anatomy. RADIATION DOSE REDUCTION: This exam was performed according to the departmental dose-optimization program which includes automated exposure control, adjustment of the mA and/or kV according to patient size and/or use of iterative reconstruction technique. CONTRAST:  112mL OMNIPAQUE IOHEXOL 350 MG/ML SOLN COMPARISON:  Radiograph 06/07/2021, right shoulder CT 01/15/2021 FINDINGS: Cardiovascular: Mild cardiomegaly. No pericardial disease. Coronary artery calcifications. Normal size main and branch pulmonary arteries. There is a right upper lobe subsegmental pulmonary embolism and a left upper lobe segmental and subsegmental pulmonary embolism (series 5, images 100 and 110-111). No central/saddle embolism. No evidence of right heart strain (RV: LV ratio is 0.8). There is moderate atherosclerotic calcifications of the aortic arch and descending aorta. Mediastinum/Nodes: No lymphadenopathy.The thyroid is unremarkable. The esophagus is unremarkable. Lungs/Pleura: There is endobronchial material within the left mainstem and lower lobe bronchus, with near complete left lower lobe collapse and areas of decreased density within the collapsed left lower lobe consistent with pneumonia. Trace respiratory secretions in the trachea.Right basilar atelectasis. Lingular subsegmental atelectasis. Small left and trace right pleural  effusions. No pneumothorax. Upper Abdomen: Calcified hepatic granulomas. Musculoskeletal: There are subacute left posterior ninth, tenth, and eleventh rib fractures near the costovertebral junction.Anterior superior right glenohumeral dislocation and fragmented fracture of the coracoid. Dislocation was present on prior CT in September 22, but the fracture of the coracoid is new since that exam. 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 Review of the MIP images confirms the above findings. IMPRESSION: Right upper lobe subsegmental and left upper lobe segmental and subsegmental pulmonary emboli. No central/saddle embolism. No CT evidence of right heart strain. Endobronchial material within the left mainstem and left lower lobe bronchus, with near complete left lower lobe collapse and findings suggestive of left lower lobe aspiration pneumonia. Small left and trace right pleural effusions. Right basilar and lingular atelectasis. Subacute left posterior ninth, tenth, and eleventh rib fractures near the costovertebral junction. Anterior superior right glenohumeral dislocation with fragmented fracture of the coracoid. The right shoulder was dislocated on prior CT in September 22, but the coracoid fracture is new. Aortic Atherosclerosis (ICD10-I70.0). Critical Value/emergent results were called by telephone at the time of interpretation on 06/07/2021 at 10:06 am to provider taking care of the patient, who verbally acknowledged these results. Electronically Signed   By: Maurine Simmering M.D.   On: 06/07/2021 10:21   DG Chest Portable 1 View  Result Date: 06/07/2021 CLINICAL DATA:  Hypoxia, shortness of breath. EXAM: PORTABLE CHEST 1 VIEW COMPARISON:  May 27, 2021.  FINDINGS: The heart size and mediastinal contours are within normal limits. Hypoinflation of the lungs is noted with mild bibasilar subsegmental  atelectasis. The visualized skeletal structures are unremarkable. IMPRESSION: Hypoinflation of the lungs with mild bibasilar subsegmental atelectasis. Electronically Signed   By: Marijo Conception M.D.   On: 06/07/2021 08:51   DG HIP UNILAT WITH PELVIS 1V LEFT  Result Date: 06/06/2021 CLINICAL DATA:  History of left hip replacement on 06/03/2021 EXAM: DG HIP (WITH OR WITHOUT PELVIS) 1V*L* COMPARISON:  Intraoperative hip radiograph 06/03/2021 FINDINGS: Postsurgical changes reflecting left hip arthroplasty are again seen. Prosthesis is high riding within the acetabulum, similar to the intraoperative radiographs. There is no evidence of complication. Mild degenerative change about the right hip is noted. The SI joints and symphysis pubis are intact. IMPRESSION: Postsurgical changes reflecting left hip arthroplasty without evidence of complication. Electronically Signed   By: Valetta Mole M.D.   On: 06/06/2021 09:17   VAS [place] LOWER EXTREMITY VENOUS (DVT) (ONLY [facility] & [facility])  Result Date: 06/07/2021  Lower Venous DVT Study Patient Name:  TIAN MCMURTREY  Date of Exam:   06/07/2021 Medical Rec #: [id number]    Accession #:    [id number] Date of Birth: [birth year]    Patient Gender: M Patient Age:   89 years Exam Location:  [facility] Procedure:      VAS [place] LOWER EXTREMITY VENOUS (DVT) Referring Phys: Marda Stalker --------------------------------------------------------------------------------  Indications: Pain.  Risk Factors: None identified. Limitations: Poor ultrasound/tissue interface. Comparison Study: No prior studies. Performing Technologist: Oliver Hum RVT  Examination Guidelines: A complete evaluation includes B-mode imaging, spectral Doppler, color Doppler, and power Doppler as needed of all accessible portions of each vessel. Bilateral testing is considered an integral part of a complete examination. Limited examinations for reoccurring indications may be performed as noted. The reflux portion of  the exam is performed with the patient in reverse Trendelenburg.  +---------+---------------+---------+-----------+----------+--------------+  RIGHT     Compressibility Phasicity Spontaneity Properties Thrombus Aging  +---------+---------------+---------+-----------+----------+--------------+  CFV       Full            Yes       Yes                                    +---------+---------------+---------+-----------+----------+--------------+  SFJ       Full                                                             +---------+---------------+---------+-----------+----------+--------------+  FV Prox   Full                                                             +---------+---------------+---------+-----------+----------+--------------+  FV Mid    Full                                                             +---------+---------------+---------+-----------+----------+--------------+  FV Distal Full                                                             +---------+---------------+---------+-----------+----------+--------------+  PFV       Full                                                             +---------+---------------+---------+-----------+----------+--------------+  POP       Full            Yes       Yes                                    +---------+---------------+---------+-----------+----------+--------------+  PTV       Full                                                             +---------+---------------+---------+-----------+----------+--------------+  PERO      Full                                                             +---------+---------------+---------+-----------+----------+--------------+   +---------+---------------+---------+-----------+----------+--------------+  LEFT      Compressibility Phasicity Spontaneity Properties Thrombus Aging  +---------+---------------+---------+-----------+----------+--------------+  CFV       Full            Yes       Yes                                     +---------+---------------+---------+-----------+----------+--------------+  SFJ       Full                                                             +---------+---------------+---------+-----------+----------+--------------+  FV Prox   Full                                                             +---------+---------------+---------+-----------+----------+--------------+  FV Mid    Full                                                             +---------+---------------+---------+-----------+----------+--------------+  FV Distal Full                                                             +---------+---------------+---------+-----------+----------+--------------+  PFV       Full                                                             +---------+---------------+---------+-----------+----------+--------------+  POP       Full            Yes       Yes                                    +---------+---------------+---------+-----------+----------+--------------+  PTV       Full                                                             +---------+---------------+---------+-----------+----------+--------------+  PERO      Full                                                             +---------+---------------+---------+-----------+----------+--------------+    Summary: RIGHT: - There is no evidence of deep vein thrombosis in the lower extremity.  - No cystic structure found in the popliteal fossa.  LEFT: - There is no evidence of deep vein thrombosis in the lower extremity.  - No cystic structure found in the popliteal fossa.  *See table(s) above for measurements and observations.    Preliminary       LOS: 0 days    Cordelia Poche, MD [facility] 06/08/2021, 8:37 AM   If 7PM-7AM, please contact night-coverage [email or web address]

## 2021-06-08 NOTE — Evaluation (Addendum)
Physical Therapy Evaluation Patient Details Name: Logan [name] MRN: [id number] DOB: [birth year] Today's Date: 06/08/2021  History of Present Illness  76 yo male s/p L THA on 06/03/21. DC to SNF, Back to ED with SOB, bilateral PE, negative for DVT. PMH: HTN, ACDF C3-4, afib, HF, DM, obesity, sleep apnea and chronic pain.  Clinical Impression  The patient is known to this Probation officer from  last week admission for THA.  The patient demonstrates improved ability to assist with bed mobility, standing at  RW(platform and regular RW). Patient stood from bed x 3 .Patient able  to take  small side steps supported on RW.Marland Kitchen  Patient having loose BM's during therapy.  Patient on Ra initially, Spo2 91% at rest, dropped to 875 with rolling in bed. Placed on 2 L [place], SPo2 back to 95% and remained >95 for further mobility efforts.   Patient will benefit from continued PT in acute care to improve function and  Dc back to a rehab facility.   Recommendations for follow up therapy are one component of a multi-disciplinary discharge planning process, led by the attending physician.  Recommendations may be updated based on patient status, additional functional criteria and insurance authorization.  Follow Up Recommendations Acute inpatient rehab (3hours/day)    Assistance Recommended at Discharge Frequent or constant Supervision/Assistance  Patient can return home with the following  Assistance with cooking/housework;Assist for transportation;Help with stairs or ramp for entrance;Two people to help with walking and/or transfers;A lot of help with bathing/dressing/bathroom    Equipment Recommendations None recommended by PT  Recommendations for Other Services       Functional Status Assessment Patient has had a recent decline in their functional status and demonstrates the ability to make significant improvements in function in a reasonable and predictable amount of time.     Precautions / Restrictions  Precautions Precautions: Fall Precaution Comments: monitor  sats, needs O2      Mobility  Bed Mobility   Bed Mobility: Rolling Rolling: Mod assist, +2 for physical assistance, +2 for safety/equipment  Supine to sit with max +2 assist for legs and trunk, bed pad use.Total assist for trunk and legs back to supine.       General bed mobility comments: rolled multiple times to be washed up and linens changed. patient assists  with the LUE, R shoulder is limited  to reach, flexes the  knees and hip  to facilitate rolling.    Transfers Overall transfer level: Needs assistance Equipment used: Bilateral platform walker, Rolling walker (2 wheels) Transfers: Sit to/from Stand Sit to Stand: Mod assist, +2 physical assistance, +2 safety/equipment, From elevated surface           General transfer comment: Patient stood from bed x 2 at p;atform Rw, then stood at Rw x 1. able to step back and side step x 4 .    Ambulation/Gait                  Stairs            Wheelchair Mobility    Modified Rankin (Stroke Patients Only)       Balance   Sitting-balance support: Bilateral upper extremity supported, Feet supported Sitting balance-Leahy Scale: Fair Sitting balance - Comments: much improved sitting balance on bed edge.   Standing balance support: Bilateral upper extremity supported, During functional activity, Reliant on assistive device for balance Standing balance-Leahy Scale: Poor Standing balance comment: Reliant on bileral UE, able to stand and lean on platform,  stood x 2 minutes to be washed up/.                             Pertinent Vitals/Pain Pain Assessment Faces Pain Scale: Hurts little more Pain Location: L hip, R shoulder Pain Descriptors / Indicators: Discomfort, Aching, Grimacing Pain Intervention(s): Monitored during session, Premedicated before session    Home Living Family/patient expects to be discharged to:: Skilled nursing  facility                   Additional Comments: to return to a SNF    Prior Function Prior Level of Function : Needs assist             Mobility Comments: has required 2 mod/max assist for standing and transfers post THA using EVA platform RW with PT prior to Dc to SNF ADLs Comments: has required 2 mod/max assist for standing and transfers post THA using EVA platform RW with PT prior to Dc to SNF     Hand Dominance   Dominant Hand: Right    Extremity/Trunk Assessment   Upper Extremity Assessment RUE Deficits / Details: Limited AROM at shoulder (chronic); AROM WFL at elbow/wrist/digits. Generalized weakness.    Lower Extremity Assessment Lower Extremity Assessment: LLE deficits/detail RLE Deficits / Details: right knee varus andn lacks extension LLE Deficits / Details: able to flex hip and knee and extend when in supine.  bears weight on the leg to stand    Cervical / Trunk Assessment Cervical / Trunk Assessment: Kyphotic  Communication   Communication:  (some speech deficits,)  Cognition Arousal/Alertness: Awake/alert Behavior During Therapy: WFL for tasks assessed/performed Overall Cognitive Status: Within Functional Limits for tasks assessed                                          General Comments      Exercises     Assessment/Plan    PT Assessment Patient needs continued PT services  PT Problem List Decreased strength;Decreased mobility;Decreased range of motion;Decreased activity tolerance;Decreased balance;Decreased knowledge of use of DME;Pain       PT Treatment Interventions DME instruction;Therapeutic exercise;Gait training;Functional mobility training;Therapeutic activities;Patient/family education    PT Goals (Current goals can be found in the Care Plan section)  Acute Rehab PT Goals Patient Stated Goal: to walk PT Goal Formulation: With patient Time For Goal Achievement: 06/22/21 Potential to Achieve Goals: Fair     Frequency Min 5X/week     Co-evaluation               AM-PAC PT "6 Clicks" Mobility  Outcome Measure Help needed turning from your back to your side while in a flat bed without using bedrails?: A Lot Help needed moving from lying on your back to sitting on the side of a flat bed without using bedrails?: A Lot Help needed moving to and from a bed to a chair (including a wheelchair)?: A Lot Help needed standing up from a chair using your arms (e.g., wheelchair or bedside chair)?: A Lot Help needed to walk in hospital room?: Total Help needed climbing 3-5 steps with a railing? : Total 6 Click Score: 10    End of Session Equipment Utilized During Treatment: Gait belt Activity Tolerance: Patient tolerated treatment well Patient left: in bed;with call bell/phone within reach;with bed alarm set Nurse Communication:  Mobility status PT Visit Diagnosis: Other abnormalities of gait and mobility (R26.89);Muscle weakness (generalized) (M62.81);Difficulty in walking, not elsewhere classified (R26.2);History of falling (Z91.81)    Time: 6811-5726 PT Time Calculation (min) (ACUTE ONLY): 54 min   Charges:   PT Evaluation $PT Eval Low Complexity: 1 Low PT Treatments $Therapeutic Activity: 23-37 mins $Self Care/Home Management: [place] Pager [phone] Office [phone]   [name] 06/08/2021, 3:27 PM

## 2021-06-09 DIAGNOSIS — I2699 Other pulmonary embolism without acute cor pulmonale: Secondary | ICD-10-CM | POA: Diagnosis not present

## 2021-06-09 DIAGNOSIS — J9601 Acute respiratory failure with hypoxia: Secondary | ICD-10-CM | POA: Diagnosis not present

## 2021-06-09 DIAGNOSIS — J690 Pneumonitis due to inhalation of food and vomit: Secondary | ICD-10-CM | POA: Diagnosis not present

## 2021-06-09 DIAGNOSIS — I4821 Permanent atrial fibrillation: Secondary | ICD-10-CM | POA: Diagnosis not present

## 2021-06-09 DIAGNOSIS — I10 Essential (primary) hypertension: Secondary | ICD-10-CM

## 2021-06-09 LAB — CULTURE, RESPIRATORY W GRAM STAIN: Culture: NORMAL

## 2021-06-09 MED ORDER — GUAIFENESIN-DM 100-10 MG/5ML PO SYRP: 5.0000 mL | ORAL_SOLUTION | ORAL | Status: DC | PRN

## 2021-06-09 MED ORDER — IPRATROPIUM-ALBUTEROL 0.5-2.5 (3) MG/3ML IN SOLN
3.0000 mL | Freq: Four times a day (QID) | RESPIRATORY_TRACT | Status: DC | PRN
  Administered 2021-06-09 – 2021-06-11 (×2): 3 mL via RESPIRATORY_TRACT
  Filled 2021-06-09 (×2): qty 3

## 2021-06-09 MED ORDER — GUAIFENESIN-DM 100-10 MG/5ML PO SYRP
10.0000 mL | ORAL_SOLUTION | ORAL | Status: DC | PRN
  Administered 2021-06-09 – 2021-06-11 (×3): 10 mL via ORAL
  Filled 2021-06-09 (×3): qty 10

## 2021-06-09 MED ORDER — BENZONATATE 100 MG PO CAPS: 200.0000 mg | ORAL_CAPSULE | Freq: Three times a day (TID) | ORAL | Status: DC | PRN

## 2021-06-09 NOTE — Progress Notes (Signed)
The patient is injury-free, afebrile, alert, and oriented X 3. Vital signs were within the baseline during this shift. Pt denies chest pain, SOB, nausea, vomiting, dizziness, signs or symptoms of bleeding, or acute changes during this shift. We will continue to monitor and work toward achieving the care plan goals. Logan [name]

## 2021-06-09 NOTE — Progress Notes (Signed)
Patient was turned and repositioned every 2 hours, he would be very uncomfortable and not stay in the turned position very long. He did get up with PT and sat up in the chair for a couple hours.

## 2021-06-09 NOTE — Progress Notes (Signed)
Orthopaedics Daily Progress Note   06/09/2021   8:31 AM  Logan [name] is a 76 y.o. male who returned to the hospital with PE and pneumonia after left total hip arthroplasty on 06/03/2021.  Subjective Minimal pain in the left hip.  No new pain in the right shoulder.  Denies nausea, vomiting, or fevers.   Objective Vitals:   06/08/21 2036 06/09/21 0540  BP: (!) 102/56 109/66  Pulse: 73 65  Resp: 14 14  Temp: 97.9 F (36.6 C) 97.8 F (36.6 C)  SpO2: 100% 100%    Intake/Output Summary (Last 24 hours) at 06/09/2021 0831 Last data filed at 06/09/2021 0548 Gross per 24 hour  Intake 960 ml  Output 1400 ml  Net -440 ml    Physical Exam LLE: Dressing clean, dry, and intact +DF/PF/EHL SILT SP/DP/T WWP distally  RUE: Minimal tenderness over coracoid  Assessment 76 y.o. male with PE and pneumonia following left total hip arthroplasty on 06/03/2021.  Also with possible right distal coracoid process fracture noted on chest CT, asymptomatic.  Plan Mobility: Out of bed with PT/OT Possible right coracoid fx noted on chest CT 06/07/2021 is extremely distal; no interventions warranted, no change in WB status, no new restrictions Okay to continue PT/OT s/p L THA without new RUE precautions Pain control: Continue to wean/titrate to appropriate oral regimen DVT Prophylaxis: Anticoagulation per primary team for PE Further surgical plans: None at this time RUE: Weightbearing as tolerated, no restrictions LUE: Weightbearing as tolerated, no restrictions RLE: Weightbearing as tolerated, no restrictions LLE: Weightbearing as tolerated Disposition: Per primary team as medically appropriate   Georgeanna Harrison M.D. Orthopaedic Surgery [facility]

## 2021-06-09 NOTE — Progress Notes (Signed)
Physical Therapy Treatment Patient Details Name: Logan [name] MRN: [id number] DOB: 05/03/1945 Today's Date: 06/09/2021   History of Present Illness 76 yo male s/p L THA on 06/03/21. DC to SNF, Back to ED with SOB, bilateral PE, negative for DVT. PMH: HTN, ACDF C3-4, afib, HF, DM, obesity, sleep apnea and chronic pain.    PT Comments    Mr. Langhans was able to mobilize well today- still needed MaxAx2 for all bed mobility, but once up at EOB he was able to stand from elevated surface with MinAx2 and took pivotal steps over to recliner also with MinAx2. Did well with [place] but did rely heavily on double elbow support. VSS on 2LPM. Left up in recliner with all needs met, RN aware of pt status and alarm active. Will continue efforts.     Recommendations for follow up therapy are one component of a multi-disciplinary discharge planning process, led by the attending physician.  Recommendations may be updated based on patient status, additional functional criteria and insurance authorization.  Follow Up Recommendations  Acute inpatient rehab (3hours/day)     Assistance Recommended at Discharge Frequent or constant Supervision/Assistance  Patient can return home with the following Assistance with cooking/housework;Assist for transportation;Help with stairs or ramp for entrance;Two people to help with walking and/or transfers;A lot of help with bathing/dressing/bathroom   Equipment Recommendations  None recommended by PT    Recommendations for Other Services       Precautions / Restrictions Precautions Precautions: Fall Precaution Comments: monitor  sats, needs O2 Restrictions Weight Bearing Restrictions: No Other Position/Activity Restrictions: WBAT     Mobility  Bed Mobility Overal bed mobility: Needs Assistance Bed Mobility: Supine to Sit     Supine to sit: Max assist, +2 for physical assistance, HOB elevated     General bed mobility comments: maxAx2 to bring hips around and scoot hips  to EOB    Transfers Overall transfer level: Needs assistance Equipment used:  (eva walker) Transfers: Sit to/from Stand Sit to Stand: +2 physical assistance, +2 safety/equipment, From elevated surface, Min assist Stand pivot transfers: Min assist, +2 physical assistance, +2 safety/equipment         General transfer comment: able to boost from elevated bed with MinAx2, then took pivotal steps over to reclienr wtih MinAx2 and eva walker    Ambulation/Gait               General Gait Details: deferred   Stairs             Wheelchair Mobility    Modified Rankin (Stroke Patients Only)       Balance Overall balance assessment: Needs assistance Sitting-balance support: Bilateral upper extremity supported, Feet supported Sitting balance-Leahy Scale: Fair     Standing balance support: Bilateral upper extremity supported, During functional activity, Reliant on assistive device for balance   Standing balance comment: Reliant on bileral UE, able to stand and lean on platform                            Cognition Arousal/Alertness: Awake/alert Behavior During Therapy: WFL for tasks assessed/performed Overall Cognitive Status: Within Functional Limits for tasks assessed                                 General Comments: pleasant and cooperative but perseverating on breathing treatment        Exercises  General Comments General comments (skin integrity, edema, etc.): VSS on 2LPM      Pertinent Vitals/Pain Pain Assessment Pain Assessment: Faces Faces Pain Scale: Hurts a little bit Pain Location: L hip, R shoulder Pain Descriptors / Indicators: Discomfort, Aching, Grimacing Pain Intervention(s): Limited activity within patient's tolerance, Monitored during session, Repositioned    Home Living                          Prior Function            PT Goals (current goals can now be found in the care plan section)  Acute Rehab PT Goals Patient Stated Goal: to walk PT Goal Formulation: With patient Time For Goal Achievement: 06/22/21 Potential to Achieve Goals: Fair Progress towards PT goals: Progressing toward goals    Frequency    Min 5X/week      PT Plan Current plan remains appropriate    Co-evaluation              AM-PAC PT "6 Clicks" Mobility   Outcome Measure  Help needed turning from your back to your side while in a flat bed without using bedrails?: A Lot Help needed moving from lying on your back to sitting on the side of a flat bed without using bedrails?: Total Help needed moving to and from a bed to a chair (including a wheelchair)?: Total Help needed standing up from a chair using your arms (e.g., wheelchair or bedside chair)?: Total Help needed to walk in hospital room?: Total Help needed climbing 3-5 steps with a railing? : Total 6 Click Score: 7    End of Session Equipment Utilized During Treatment: Gait belt Activity Tolerance: Patient tolerated treatment well Patient left: in chair;with call bell/phone within reach;with chair alarm set Nurse Communication: Mobility status PT Visit Diagnosis: Other abnormalities of gait and mobility (R26.89);Muscle weakness (generalized) (M62.81);Difficulty in walking, not elsewhere classified (R26.2);History of falling (Z91.81)     Time: 1610-9604 PT Time Calculation (min) (ACUTE ONLY): 10 min  Charges:  $Therapeutic Activity: 8-22 mins                    Ann Lions PT, DPT, PN2   Supplemental Physical Therapist [place]

## 2021-06-09 NOTE — Progress Notes (Signed)
PT Cancellation Note  Patient Details Name: Logan [name] MRN: [id number] DOB: Apr 28, 1945   Cancelled Treatment:    Reason Eval/Treat Not Completed: Other (comment) per imaging done on 2/17, it looks as if he has a new R UE coracoid fx. I don't see any mentions of this in the chart otherwise in terms of precautions. Awaiting response from ortho team prior to seeing for WB status/any appropriate precautions in this case.   Ann Lions PT, DPT, PN2   Supplemental Physical Therapist [place]

## 2021-06-09 NOTE — Progress Notes (Signed)
Progress Note   Patient: Logan [name] WNI:[id number] DOB: May 14, 1945 DOA: 06/07/2021     1 DOS: the patient was seen and examined on 06/09/2021   Brief hospital course: Logan [name] is a 76 y.o. male with a history of gout, atrial fibrillation, prediabetes, hypertension, DVT, hyperlipidemia. Patient on Eliquis prior to admission. Patient presented secondary to shortness of breath and was found to have an acute bilateral PE in addition to evidence of aspiration pneumonia. Heparin IV initiated and is now transitioned to back to Eliquis with load.  Assessment and Plan: * Acute pulmonary embolism ([place])- (present on admission) Bilateral PEs located in right upper lobe (subsegmental) and left upper lobe (segmental) without evidence of central/saddle embolism or right heart strain on imaging. Started on Heparin IV. Discussed case with hematology who thinks it is unlikely failure of Eliquis since patient was discharged on lower dose post-surgery. - transitioned to oral anti coagulation.  - therapy evaluations recommended.   Aspiration pneumonia ([place]) Noted to be likely on imaging. Patient with productive coughing. Afebrile but with an elevated WBC. Complicated by recent intubation for surgery. Started on Zosyn in the ED. - transitioned to  Unasyn IV. Add duonebs , flutter valve, IS and robitussin . -Sputum culture pending -Flutter valve  Acute respiratory failure with hypoxia ([place]) Secondary to acute PE and aspiration pneumonia. Initially hypoxic with documented SpO2 down to 84%. Patient initially required 15 L/min oxygen delivery and has improved. Currently down to 2 L/min of oxygen -Wean to room air as able -Ambulatory pulse ox prior to discharge  H/O total hip arthroplasty, left WBAT LLE. Patient [facility] SNF [facility] consulted for likely placement needs  Macrocytic anemia Normal Vitamin B12 and folate from 2022. Baseline hemoglobin of about 12 with post-operative hemoglobin of 9-10.  Currently stable. Normal Vitamin B12/folate.  Atrial fibrillation ([place])- (present on admission)  Rate controlled.  Persistent atrial fibrillation per notes. On Coreg and Eliquis -Continue Coreg -on Eliquis for anticoagulation  Gout- (present on admission) -Continue allopurinol  Hypertension- (present on admission) -Continue Coreg and losartan -Hydralazine PRN - BPparameters are optimal.   Primary osteoarthritis of left hip- (present on admission) Patient is s/p total hip arthroplasty on 06/03/2021        Subjective: reports coughing and some sob occasionally.   Physical Exam: Vitals:   06/08/21 2036 06/09/21 0540 06/09/21 0915 06/09/21 1006  BP: (!) 102/56 109/66 119/63   Pulse: 73 65 62   Resp: 14 14    Temp: 97.9 F (36.6 C) 97.8 F (36.6 C)    TempSrc: Oral Oral    SpO2: 100% 100%  97%  Weight:      Height:       General exam: Ill appearing gentleman not in distress.  Respiratory system: diminished air entry, scattered rhonchi. On 2 lit of [place] oxygen.  Cardiovascular system: S1 & S2 heard, RRR. No JVD, murmurs, rubs, gallops or clicks. No pedal edema. Gastrointestinal system: Abdomen is nondistended, soft and nontender. Normal bowel sounds heard. Central nervous system: Alert and oriented. No focal neurological deficits. Extremities: Symmetric 5 x 5 power. Skin: No rashes, lesions or ulcers Psychiatry: Mood & affect appropriate.     Data Reviewed: No results found for this or any previous visit (from the past 24 hour(s)).   Family Communication: none at bedside  Disposition: Status is: Inpatient Remains inpatient appropriate because: IV antibiotics          Planned Discharge Destination:  pending.      Time spent:  45 minutes  Author: Hosie Poisson, MD 06/09/2021 10:35 AM  For on call review [email or web address].

## 2021-06-09 NOTE — Progress Notes (Addendum)
PT continues to demonstrate hands on understanding of Flutter device. Productive cough at this rime.

## 2021-06-09 NOTE — Progress Notes (Signed)
Inpatient Rehab Admissions Coordinator:  ? ?Per therapy recommendations,  patient was screened for CIR candidacy by Money Mckeithan, MS, CCC-SLP. At this time, Pt. Appears to be a a potential candidate for CIR. I will place   order for rehab consult per protocol for full assessment. Please contact me any with questions. ? ?Dorice Stiggers, MS, CCC-SLP ?Rehab Admissions Coordinator  ?[phone] (celll) ?[phone] (office) ? ?

## 2021-06-09 NOTE — Plan of Care (Signed)

## 2021-06-10 MED ORDER — GUAIFENESIN ER 600 MG PO TB12
1200.0000 mg | ORAL_TABLET | Freq: Two times a day (BID) | ORAL | Status: DC
  Administered 2021-06-10 – 2021-06-13 (×7): 1200 mg via ORAL
  Filled 2021-06-10 (×7): qty 2

## 2021-06-10 NOTE — Progress Notes (Signed)
Inpatient Rehab Admissions Coordinator:   Pt. With recent d/c to SNF, without support for d/c home if he were to come to CIR. He is currently max+2 with minimal participation with therapies. Note ortho CM is working on return to [employer]. CIR will sign off.   Clemens Catholic, [place], [place] Admissions Coordinator  [phone] ([place]) [phone] (office)

## 2021-06-10 NOTE — TOC Initial Note (Addendum)
Transition of Care [facility]) - Initial/Assessment Note    Patient Details  Name: Logan [name] MRN: [id number] Date of Birth: 01-11-1946  Transition of Care [facility]) CM/SW Contact:    Bayron Dalto, Marjie Skiff, RN Phone Number: 06/10/2021, 1:11 PM  Clinical Narrative:                 Pt came in to the hospital from [facility]. He was admitted to [facility] recently for short term rehab. Per [facility] pt can return to [place] at [place]. Voicemail left for son Kainan to alert him of pt ability to return to [place]. TOC will continue to follow.  Addendum: Pt son called me to say that he is planning on taking pt home at dc. He states he will have someone with him 24hrs a day. He states he has a RW and 3in1 at home. He states that he was active with [facility] prior to admission and would like to use them again. [facility] liaison alerted of pt desire to resume services. Will need home health orders.      Activities of Daily Living Home Assistive Devices/Equipment: Hospital bed (at the facility) ADL Screening (condition at time of admission) Patient's cognitive ability adequate to safely complete daily activities?: Yes Is the patient deaf or have difficulty hearing?: No Does the patient have difficulty seeing, even when wearing glasses/contacts?: No Does the patient have difficulty concentrating, remembering, or making decisions?: No Patient able to express need for assistance with ADLs?: Yes Does the patient have difficulty dressing or bathing?: Yes Independently performs ADLs?: No Communication: Independent Dressing (OT): Needs assistance Is this a change from baseline?: Pre-admission baseline Grooming: Independent Feeding: Independent Bathing: Needs assistance Is this a change from baseline?: Pre-admission baseline Toileting: Needs assistance Is this a change from baseline?: Pre-admission baseline In/Out Bed: Needs assistance Is this a change from baseline?: Pre-admission baseline Walks in Home:  Dependent Is this a change from baseline?: Pre-admission baseline Does the patient have difficulty walking or climbing stairs?: Yes Weakness of Legs: Left Weakness of Arms/Hands: None   Admission diagnosis:  SOB (shortness of breath) [R06.02] Hypoxia [R09.02] Acute pulmonary embolism (HCC) [I26.99] Other acute pulmonary embolism without acute cor pulmonale (HCC) [I26.99] Aspiration pneumonia, unspecified aspiration pneumonia type, unspecified laterality, unspecified part of lung ([place]) [J69.0] Patient Active Problem List   Diagnosis Date Noted   Acute pulmonary embolism ([place]) 06/07/2021   Acute respiratory failure with hypoxia ([place]) 06/07/2021   Aspiration pneumonia ([place]) 06/07/2021   H/O total hip arthroplasty, left 06/03/2021   Osteoarthritis 03/07/2021   Chronic incomplete quadriplegia ([place]) 03/04/2021   Abnormal gait 03/04/2021   Chronic pain syndrome 03/04/2021   Insomnia due to medical condition 03/04/2021   Shoulder subluxation, right 01/31/2021   Hypoalbuminemia due to protein-calorie malnutrition (HCC)    Hypokalemia    Macrocytic anemia    Prediabetes    Cervical disc disease with myelopathy 01/03/2021   Cervical myelopathy ([place]) 01/01/2021   Pneumonia 11/05/2020   Cervical radiculopathy 11/05/2020   Acute on chronic heart failure ([place]) 11/05/2020   Seasonal allergies    Orthopnea    Morbid obesity ([place])    DVT (deep venous thrombosis) ([place])    Bilateral lower extremity edema    Secondary hypercoagulable state ([place]) 03/28/2020   Persistent atrial fibrillation ([place]) 10/21/2019   Essential hypertension 10/21/2019   Chronic diastolic heart failure ([place]) 10/21/2019   Mixed hyperlipidemia 10/21/2019   Obesity (BMI 30-39.9) 08/24/2019   Sleep apnea    Primary osteoarthritis of  left hip    Hypertension    Hyperlipidemia    Gout    Esophageal reflux    Diabetes mellitus without complication [facility])    Atrial fibrillation (HCC)    Anxiety    Atherosclerotic renal  artery stenosis, unilateral ([place]) 02/28/2014   PCP:  [name], MD Pharmacy:   [facility] [address], [place] [address] [place] [address] Phone: [phone] Fax: [phone]     Social Determinants of Health (SDOH) Interventions    Readmission Risk Interventions Readmission Risk Prevention Plan 01/03/2021  Post Dischage Appt Not Complete  Appt Comments going to INPT rehab  Medication Screening Complete  Transportation Screening Complete  Some recent data might be hidden

## 2021-06-10 NOTE — Anesthesia Postprocedure Evaluation (Signed)
Anesthesia Post Note  Patient: [name]  Procedure(s) Performed: LEFT TOTAL HIP ARTHROPLASTY ANTERIOR APPROACH (Left: Hip)     Patient location during evaluation: PACU Anesthesia Type: Spinal Level of consciousness: awake Pain management: pain level controlled Vital Signs Assessment: post-procedure vital signs reviewed and stable Respiratory status: spontaneous breathing Cardiovascular status: stable Postop Assessment: no headache, no backache, spinal receding, patient able to bend at knees and no apparent nausea or vomiting Anesthetic complications: no   No notable events documented.  Last Vitals:  Vitals:   06/06/21 0611 06/06/21 1428  BP: (!) 94/52 103/65  Pulse: 66 72  Resp: 18 16  Temp: 36.6 C 36.6 C  SpO2: 100% 98%    Last Pain:  Vitals:   06/06/21 1428  TempSrc: Oral  PainSc:    Pain Goal: Patients Stated Pain Goal: 3 (06/06/21 1100)                 Huston Foley

## 2021-06-10 NOTE — Progress Notes (Signed)
PROGRESS NOTE    Logan [name]  EXN:[id number] DOB: Jun 11, 1945 DOA: 06/07/2021 PCP: [name], MD   Brief Narrative: Logan [name] is a 76 y.o. male with a history of gout, atrial fibrillation, prediabetes, hypertension, DVT, hyperlipidemia. Patient on Eliquis prior to admission. Patient presented secondary to shortness of breath and was found to have an acute bilateral PE in addition to evidence of aspiration pneumonia. Heparin IV initiated and is now transitioned to back to Eliquis with load. Plan for discharge to inpatient rehabilitation.   Assessment and Plan: * Acute pulmonary embolism ([place])- (present on admission) Bilateral PEs located in right upper lobe (subsegmental) and left upper lobe (segmental) without evidence of central/saddle embolism or right heart strain on imaging. Started on Heparin IV. Discussed case with hematology who thinks it is unlikely failure of Eliquis since patient held Eliquis for surgery followed by decrease in dose post-surgery. Transitioned to Eliquis. -Continue Eliquis with load  Aspiration pneumonia ([place]) Noted to be likely on imaging. Patient with productive coughing. Afebrile but with an elevated WBC. Complicated by recent intubation for surgery. Started on Zosyn in the ED. Sputum culture significant for respiratory flora. -Continue Unasyn with plan for 5 days of treatment -Continue flutter valve -Add guaifenesin  Acute respiratory failure with hypoxia (HCC) Secondary to acute PE and aspiration pneumonia. Initially hypoxic with documented SpO2 down to 84%. Patient initially required 15 L/min oxygen delivery and has improved. Currently down to 2 L/min of oxygen -Wean to room air as able -Ambulatory pulse ox prior to discharge  H/O total hip arthroplasty, left WBAT LLE. Patient presented from SNF. PT recommending inpatient rehab. -Follow-up inpatient rehab  Macrocytic anemia Normal Vitamin B12 and folate from 2022. Baseline hemoglobin of  about 12 with post-operative hemoglobin of 9-10. Currently stable. Normal Vitamin B12/folate.  Atrial fibrillation ([place])- (present on admission) Rate controlled.  Persistent atrial fibrillation per notes. On Coreg and Eliquis -Continue Coreg and Eliquis  Gout- (present on admission) -Continue allopurinol  Hypertension- (present on admission) -Continue Coreg and losartan -Hydralazine PRN  Primary osteoarthritis of left hip- (present on admission) Patient is s/p total hip arthroplasty on 06/03/2021     DVT prophylaxis: Heparin IV > Eliquis Code Status:   Code Status: Full Code Family Communication: None at bedside Disposition Plan: Discharge to inpatient rehab once bed is available. Medically stable for discharge   Consultants:  None  Procedures:  None  Antimicrobials: Unasyn IV    Subjective: No issues overnight. Still with some cough. No dyspnea this morning.  Objective: BP (!) 100/52 (BP Location: Right Arm)    Pulse 69    Temp 98.3 F (36.8 C) (Oral)    Resp 14    Ht 5\' 7"  (1.702 m)    Wt 88.5 kg    SpO2 94%    BMI 30.56 kg/m   Examination:  General exam: Appears calm and comfortable  Respiratory system: Clear to auscultation. Respiratory effort normal. Cardiovascular system: S1 & S2 heard, Irregular rhythm, normal rate. Gastrointestinal system: Abdomen is nondistended, soft and nontender. No organomegaly or masses felt. Normal bowel sounds heard. Central nervous system: Alert and oriented. No focal neurological deficits. Musculoskeletal: No edema. No calf tenderness Skin: No cyanosis. No rashes Psychiatry: Judgement and insight appear normal. Mood & affect appropriate.    Data Reviewed: I have personally reviewed following labs and imaging studies  CBC Lab Results  Component Value Date   WBC 10.4 06/08/2021   RBC 2.75 (L) 06/08/2021   HGB 8.8 (L) 06/08/2021  HCT 27.7 (L) 06/08/2021   MCV 100.7 (H) 06/08/2021   MCH 32.0 06/08/2021   PLT 292  06/08/2021   MCHC 31.8 06/08/2021   RDW 15.2 06/08/2021   LYMPHSABS 1.8 06/07/2021   MONOABS 0.8 06/07/2021   EOSABS 0.3 06/07/2021   BASOSABS 0.0 70/17/7939     Last metabolic panel Lab Results  Component Value Date   NA 138 06/07/2021   K 3.8 06/07/2021   CL 100 06/07/2021   CO2 33 (H) 06/07/2021   BUN 17 06/07/2021   CREATININE 0.62 06/07/2021   GLUCOSE 93 06/07/2021   GFRNONAA >60 06/07/2021   GFRAA 93 05/21/2020   CALCIUM 8.8 (L) 06/07/2021   PROT 5.8 (L) 06/07/2021   ALBUMIN 2.8 (L) 06/07/2021   BILITOT 1.3 (H) 06/07/2021   ALKPHOS 55 06/07/2021   AST 22 06/07/2021   ALT 19 06/07/2021   ANIONGAP 5 06/07/2021    GFR: Estimated Creatinine Clearance: 87.4 mL/min (by C-G formula based on SCr of 0.62 mg/dL).  Recent Results (from the past 240 hour(s))  Resp Panel by RT-PCR (Flu A&B, Covid) Nasopharyngeal Swab     Status: None   Collection Time: 06/05/21 12:49 PM   Specimen: Nasopharyngeal Swab; Nasopharyngeal(NP) swabs in vial transport medium  Result Value Ref Range Status   SARS Coronavirus 2 by RT PCR NEGATIVE NEGATIVE Final    Comment: (NOTE) SARS-CoV-2 target nucleic acids are NOT DETECTED.  The SARS-CoV-2 RNA is generally detectable in upper respiratory specimens during the acute phase of infection. The lowest concentration of SARS-CoV-2 viral copies this assay can detect is 138 copies/mL. A negative result does not preclude SARS-Cov-2 infection and should not be used as the sole basis for treatment or other patient management decisions. A negative result may occur with  improper specimen collection/handling, submission of specimen other than nasopharyngeal swab, presence of viral mutation(s) within the areas targeted by this assay, and inadequate number of viral copies(<138 copies/mL). A negative result must be combined with clinical observations, patient history, and epidemiological information. The expected result is Negative.  Fact Sheet for  Patients:  EntrepreneurPulse.com.au  Fact Sheet for Healthcare Providers:  IncredibleEmployment.be  This test is no t yet approved or cleared by the [place] FDA and  has been authorized for detection and/or diagnosis of SARS-CoV-2 by FDA under an Emergency Use Authorization (EUA). This EUA will remain  in effect (meaning this test can be used) for the duration of the COVID-19 declaration under Section 564(b)(1) of the Act, 21 U.S.C.section 360bbb-3(b)(1), unless the authorization is terminated  or revoked sooner.       Influenza A by PCR NEGATIVE NEGATIVE Final   Influenza B by PCR NEGATIVE NEGATIVE Final    Comment: (NOTE) The Xpert Xpress SARS-CoV-2/FLU/RSV plus assay is intended as an aid in the diagnosis of influenza from Nasopharyngeal swab specimens and should not be used as a sole basis for treatment. Nasal washings and aspirates are unacceptable for Xpert Xpress SARS-CoV-2/FLU/RSV testing.  Fact Sheet for Patients: EntrepreneurPulse.com.au  Fact Sheet for Healthcare Providers: IncredibleEmployment.be  This test is not yet approved or cleared by the [place] FDA and has been authorized for detection and/or diagnosis of SARS-CoV-2 by FDA under an Emergency Use Authorization (EUA). This EUA will remain in effect (meaning this test can be used) for the duration of the COVID-19 declaration under Section 564(b)(1) of the Act, 21 U.S.C. section 360bbb-3(b)(1), unless the authorization is terminated or revoked.  Performed at [facility], [place]  [address] [place], [place] [address]   Resp Panel by RT-PCR (Flu A&B, Covid) Nasopharyngeal Swab     Status: None   Collection Time: 06/07/21  8:24 AM   Specimen: Nasopharyngeal Swab; Nasopharyngeal(NP) swabs in vial transport medium  Result Value Ref Range Status   SARS Coronavirus 2 by RT PCR NEGATIVE NEGATIVE Final    Comment:  (NOTE) SARS-CoV-2 target nucleic acids are NOT DETECTED.  The SARS-CoV-2 RNA is generally detectable in upper respiratory specimens during the acute phase of infection. The lowest concentration of SARS-CoV-2 viral copies this assay can detect is 138 copies/mL. A negative result does not preclude SARS-Cov-2 infection and should not be used as the sole basis for treatment or other patient management decisions. A negative result may occur with  improper specimen collection/handling, submission of specimen other than nasopharyngeal swab, presence of viral mutation(s) within the areas targeted by this assay, and inadequate number of viral copies(<138 copies/mL). A negative result must be combined with clinical observations, patient history, and epidemiological information. The expected result is Negative.  Fact Sheet for Patients:  EntrepreneurPulse.com.au  Fact Sheet for Healthcare Providers:  IncredibleEmployment.be  This test is no t yet approved or cleared by the [place] FDA and  has been authorized for detection and/or diagnosis of SARS-CoV-2 by FDA under an Emergency Use Authorization (EUA). This EUA will remain  in effect (meaning this test can be used) for the duration of the COVID-19 declaration under Section 564(b)(1) of the Act, 21 U.S.C.section 360bbb-3(b)(1), unless the authorization is terminated  or revoked sooner.       Influenza A by PCR NEGATIVE NEGATIVE Final   Influenza B by PCR NEGATIVE NEGATIVE Final    Comment: (NOTE) The Xpert Xpress SARS-CoV-2/FLU/RSV plus assay is intended as an aid in the diagnosis of influenza from Nasopharyngeal swab specimens and should not be used as a sole basis for treatment. Nasal washings and aspirates are unacceptable for Xpert Xpress SARS-CoV-2/FLU/RSV testing.  Fact Sheet for Patients: EntrepreneurPulse.com.au  Fact Sheet for Healthcare  Providers: IncredibleEmployment.be  This test is not yet approved or cleared by the [place] FDA and has been authorized for detection and/or diagnosis of SARS-CoV-2 by FDA under an Emergency Use Authorization (EUA). This EUA will remain in effect (meaning this test can be used) for the duration of the COVID-19 declaration under Section 564(b)(1) of the Act, 21 U.S.C. section 360bbb-3(b)(1), unless the authorization is terminated or revoked.  Performed at [facility], [place] [address] [place], [place] [address]   Expectorated Sputum Assessment w Gram Stain, Rflx to Resp Cult     Status: None   Collection Time: 06/07/21  5:42 PM   Specimen: Expectorated Sputum  Result Value Ref Range Status   Specimen Description EXPECTORATED SPUTUM  Final   Special Requests NONE  Final   Sputum evaluation   Final    THIS SPECIMEN IS ACCEPTABLE FOR SPUTUM CULTURE Performed at [facility], [place] [address], [place], [place] [address]    Report Status 06/07/2021 FINAL  Final  Culture, Respiratory w Gram Stain     Status: None   Collection Time: 06/07/21  5:42 PM  Result Value Ref Range Status   Specimen Description   Final    EXPECTORATED SPUTUM Performed at [place] [address], [place], [place] [address]    Special Requests   Final    NONE Reflexed from [id number] Performed at [facility], [place] [address] [place], [place]  [address]    Gram Stain   Final    FEW WBC PRESENT,BOTH PMN AND MONONUCLEAR RARE GRAM POSITIVE COCCI RARE GRAM NEGATIVE RODS    Culture   Final    RARE Normal respiratory flora-no Staph aureus or Pseudomonas seen Performed at [facility], [address], [place], [place] [address]    Report Status 06/09/2021 FINAL  Final      Radiology Studies: No results found.    LOS: 2 days    Cordelia Poche, MD [facility] 06/10/2021, 8:50 AM   If 7PM-7AM,  please contact night-coverage [email or web address]

## 2021-06-10 NOTE — Progress Notes (Signed)
Physical Therapy Treatment Patient Details Name: Logan [name] MRN: [id number] DOB: Oct 14, 1945 Today's Date: 06/10/2021   History of Present Illness 76 yo male s/p L THA on 06/03/21. DC to SNF, Back to ED with SOB, bilateral PE, negative for DVT. PMH: HTN, ACDF C3-4, afib, HF, DM, obesity, sleep apnea and chronic pain.    PT Comments    The patient reports that he has felt SOB today. SPO2 on 2 L > 925.  Patient reports feeling need for a BM. Patient incontinent of Bm upon standing up from bed. Patient Assisted to pivot to [facility], then recliner brought up  as patient was quite fatigued. Continue PT for progressive mobility/ambulation.  Recommendations for follow up therapy are one component of a multi-disciplinary discharge planning process, led by the attending physician.  Recommendations may be updated based on patient status, additional functional criteria and insurance authorization.  Follow Up Recommendations  Skilled nursing-short term rehab (<3 hours/day)     Assistance Recommended at Discharge Frequent or constant Supervision/Assistance  Patient can return home with the following Assistance with cooking/housework;Assist for transportation;Help with stairs or ramp for entrance;Two people to help with walking and/or transfers;A lot of help with bathing/dressing/bathroom   Equipment Recommendations  None recommended by PT    Recommendations for Other Services       Precautions / Restrictions Precautions Precautions: Fall Precaution Comments: monitor  sats, needs O2 Restrictions Weight Bearing Restrictions: No Other Position/Activity Restrictions: WBAT     Mobility  Bed Mobility   Bed Mobility: Supine to Sit     Supine to sit: Mod assist, +2 for physical assistance, +2 for safety/equipment, HOB elevated, Max assist     General bed mobility comments: maxAx2 to bring hips around and scoot hips to EOB    Transfers Overall transfer level: Needs assistance Equipment used:  Bilateral platform walker, Rolling walker (2 wheels) Transfers: Sit to/from Stand, Bed to chair/wheelchair/BSC Sit to Stand: +2 physical assistance, +2 safety/equipment, From elevated surface, Mod assist, Min assist   Step pivot transfers: Mod assist, +2 physical assistance, +2 safety/equipment       General transfer comment: patient stands from raised bed with min assist, support of right leg due to decreased knee flexion and varus,  incontinent of BM upon standing. Pivot  steps to [facility]. Stood again x 2 from [facility] with mod assistance at [employer]. Recliner brought up behind after finished on the [place].    Ambulation/Gait                   Stairs             Wheelchair Mobility    Modified Rankin (Stroke Patients Only)       Balance Overall balance assessment: Needs assistance                                          Cognition Arousal/Alertness: Awake/alert                                              Exercises      General Comments        Pertinent Vitals/Pain Pain Assessment Faces Pain Scale: Hurts little more Pain Location: L hip, R shoulder Pain Descriptors / Indicators: Discomfort, Aching, Grimacing Pain Intervention(s):  Monitored during session, Premedicated before session    Home Living Family/patient expects to be discharged to:: Skilled nursing facility Living Arrangements: Alone Available Help at Discharge: Family;Available PRN/intermittently Type of Home: Mobile home Home Access: Ramped entrance       Home Layout: One level Home Equipment: Conservation officer, nature (2 wheels);Wheelchair - manual;Grab bars - tub/shower;BSC/3in1;Tub bench;Other (comment) Additional Comments: to return to a SNF    Prior Function            PT Goals (current goals can now be found in the care plan section) Progress towards PT goals: Progressing toward goals    Frequency    Min 5X/week      PT Plan Current plan remains  appropriate;Discharge plan needs to be updated    Co-evaluation PT/OT/SLP Co-Evaluation/Treatment: Yes Reason for Co-Treatment: For patient/therapist safety PT goals addressed during session: Mobility/safety with mobility OT goals addressed during session: ADL's and self-care      AM-PAC PT "6 Clicks" Mobility   Outcome Measure  Help needed turning from your back to your side while in a flat bed without using bedrails?: A Lot Help needed moving from lying on your back to sitting on the side of a flat bed without using bedrails?: A Lot Help needed moving to and from a bed to a chair (including a wheelchair)?: A Lot Help needed standing up from a chair using your arms (e.g., wheelchair or bedside chair)?: A Lot Help needed to walk in hospital room?: Total Help needed climbing 3-5 steps with a railing? : Total 6 Click Score: 10    End of Session Equipment Utilized During Treatment: Gait belt Activity Tolerance: Patient tolerated treatment well Patient left: in chair;with call bell/phone within reach;with chair alarm set Nurse Communication: Mobility status PT Visit Diagnosis: Other abnormalities of gait and mobility (R26.89);Muscle weakness (generalized) (M62.81);Difficulty in walking, not elsewhere classified (R26.2);History of falling (Z91.81)     Time: 1165-7903 PT Time Calculation (min) (ACUTE ONLY): 26 min  Charges:  $Therapeutic Activity: 8-22 mins                     [name] PT Acute Rehabilitation Services Pager [phone] Office [phone]    [name] 06/10/2021, 12:56 PM

## 2021-06-10 NOTE — Care Plan (Signed)
Following for needs and disposition. Patient had just discharged. To [facility] prior to readmission. He cannot tolerate 3 hours of therapy and will need to return to STSNF level of care. He remains 2 + max assist and pain control is difficult.  I have spoken with CM in the hospital and they will contact the son and the facility to secure a bed.   MD/PA updated   Ladell Heads, [place]

## 2021-06-10 NOTE — Progress Notes (Signed)
Pharmacy Antibiotic Note  Logan [name] is a 76 y.o. male admitted on 06/07/2021 with shortness of breath, imaging concerning for aspiration pneumonia.  Pharmacy has been consulted for Unasyn dosing.  Day #4 of abx. Afebrile, WBC wnl. Resp cx shows normal flora.  Plan: Continue Unasyn 3g IV q6 hours Consider stopping abx tomorrow after a 5 day course, could transition to Augmentin today for last 1-2 days.  Height: 5\' 7"  (170.2 cm) Weight: 88.5 kg (195 lb 1.7 oz) IBW/kg (Calculated) : 66.1  Temp (24hrs), Avg:98 F (36.7 C), Min:97.8 F (36.6 C), Max:98.3 F (36.8 C)  Recent Labs  Lab 06/04/21 0328 06/05/21 0331 06/06/21 0319 06/07/21 0824 06/07/21 1024 06/08/21 0320  WBC 12.0* 12.6* 11.4* 11.3*  --  10.4  CREATININE 0.67  --   --  0.62  --   --   LATICACIDVEN  --   --   --  1.1 1.1  --      Estimated Creatinine Clearance: 87.4 mL/min (by C-G formula based on SCr of 0.62 mg/dL).    No Known Allergies  Antimicrobials this admission: Zosyn 2/17 >> 2/17 Unasyn 2/17 >>   Dose adjustments this admission:   Microbiology results: 2/17 Sputum: normal flora   Thank you for allowing pharmacy to be a part of this patients care.  Elenor Quinones, PharmD, BCPS, BCIDP Clinical Pharmacist 06/10/2021 8:04 AM

## 2021-06-10 NOTE — Evaluation (Addendum)
Occupational Therapy Evaluation Patient Details Name: Logan [name] MRN: [id number] DOB: 19-May-1945 Today's Date: 06/10/2021   History of Present Illness 77 yo male s/p L THA on 06/03/21. DC to SNF, Back to ED with SOB, bilateral PE, negative for DVT. PMH: HTN, ACDF C3-4, afib, HF, DM, obesity, sleep apnea and chronic pain.   Clinical Impression   Patient is a 76 year old male who was at SNF receiving therapy when presented back to hospital with bilateral PE. Patient noted to have continued decreased activity tolerance, decreased endurance, decreased standing balance, and increased pain continuing to impact Adls. Patient plans to transition back to SNF once medically stable. Patient would continue to benefit from skilled OT services at this time while admitted and after d/c to address noted deficits in order to improve overall safety and independence in ADLs.        Recommendations for follow up therapy are one component of a multi-disciplinary discharge planning process, led by the attending physician.  Recommendations may be updated based on patient status, additional functional criteria and insurance authorization.   Follow Up Recommendations  Skilled nursing-short term rehab (<3 hours/day)    Assistance Recommended at Discharge Frequent or constant Supervision/Assistance  Patient can return home with the following A lot of help with walking and/or transfers;A lot of help with bathing/dressing/bathroom;Assistance with cooking/housework;Assist for transportation    Functional Status Assessment  Patient has had a recent decline in their functional status and demonstrates the ability to make significant improvements in function in a reasonable and predictable amount of time.  Equipment Recommendations  None recommended by OT    Recommendations for Other Services       Precautions / Restrictions Precautions Precautions: Fall Precaution Comments: monitor  sats, needs  O2 Restrictions Weight Bearing Restrictions: No Other Position/Activity Restrictions: WBAT LLE and RUE      Mobility Bed Mobility Overal bed mobility: Needs Assistance       Supine to sit: Mod assist, +2 for physical assistance, +2 for safety/equipment, HOB elevated, Max assist     General bed mobility comments: maxAx2 to bring hips around and scoot hips to EOB    Transfers                          Balance Overall balance assessment: Needs assistance Sitting-balance support: Bilateral upper extremity supported, Feet supported Sitting balance-Leahy Scale: Fair     Standing balance support: Bilateral upper extremity supported, During functional activity, Reliant on assistive device for balance Standing balance-Leahy Scale: Poor                             ADL either performed or assessed with clinical judgement   ADL Overall ADL's : Needs assistance/impaired     Grooming: Wash/dry face;Minimal assistance;Sitting   Upper Body Bathing: Sitting;Moderate assistance   Lower Body Bathing: Maximal assistance;Sit to/from stand   Upper Body Dressing : Sitting;Moderate assistance   Lower Body Dressing: Maximal assistance;Sitting/lateral leans;Sit to/from stand   Toilet Transfer: Maximal assistance;+2 for physical assistance;+2 for safety/equipment;BSC/3in1;Stand-pivot Toilet Transfer Details (indicate cue type and reason): transfer to 3 in 1 commode with EVA then sit to stand max A x2 with RW to swith 3 in 1 and recliner to sit down. needs physical assist to put RLE on the floor in proper positioning. Toileting- Clothing Manipulation and Hygiene: Total assistance;Sit to/from stand       Functional mobility during  ADLs: +2 for physical assistance;+2 for safety/equipment;Maximal assistance       Vision   Vision Assessment?: No apparent visual deficits     Perception     Praxis      Pertinent Vitals/Pain Pain Assessment Pain Assessment:  Faces Faces Pain Scale: Hurts little more Pain Location: L hip, R shoulder Pain Descriptors / Indicators: Discomfort, Aching, Grimacing Pain Intervention(s): Monitored during session, Premedicated before session     Hand Dominance Right   Extremity/Trunk Assessment Upper Extremity Assessment Upper Extremity Assessment: RUE deficits/detail RUE Deficits / Details: no ROM at shoulder able to minimally shrug shoulder with increased cues. old imaging from 9/22 indicated "Anterosuperior dislocation of the right humeral head and full thickness tear of RTC" RUE Coordination: decreased gross motor   Lower Extremity Assessment Lower Extremity Assessment: Defer to PT evaluation   Cervical / Trunk Assessment Cervical / Trunk Assessment: Kyphotic   Communication     Cognition Arousal/Alertness: Awake/alert Behavior During Therapy: WFL for tasks assessed/performed Overall Cognitive Status: Within Functional Limits for tasks assessed                                       General Comments       Exercises     Shoulder Instructions      Home Living Family/patient expects to be discharged to:: Skilled nursing facility Living Arrangements: Alone Available Help at Discharge: Family;Available PRN/intermittently Type of Home: Mobile home Home Access: Ramped entrance     Home Layout: One level     Bathroom Shower/Tub: Teacher, early years/pre: Standard     Home Equipment: Conservation officer, nature (2 wheels);Wheelchair - manual;Grab bars - tub/shower;BSC/3in1;Tub bench;Other (comment)   Additional Comments: to return to a SNF      Prior Functioning/Environment Prior Level of Function : Needs assist       Physical Assist : Mobility (physical);ADLs (physical) Mobility (physical): Gait ADLs (physical): Bathing   ADLs Comments: son assisted with washing hair at home.otherwise was MI for ADLs prior to hospitalizations.        OT Problem List: Decreased  strength;Decreased range of motion;Decreased activity tolerance;Impaired balance (sitting and/or standing);Decreased coordination;Decreased knowledge of use of DME or AE;Cardiopulmonary status limiting activity;Obesity;Impaired UE functional use;Pain;Increased edema      OT Treatment/Interventions: Self-care/ADL training;Therapeutic exercise;Energy conservation;DME and/or AE instruction;Therapeutic activities;Patient/family education;Balance training    OT Goals(Current goals can be found in the care plan section) Acute Rehab OT Goals Patient Stated Goal: to get more therapy OT Goal Formulation: With patient Time For Goal Achievement: 06/24/21 Potential to Achieve Goals: Good  OT Frequency: Min 2X/week    Co-evaluation   Reason for Co-Treatment: For patient/therapist safety;To address functional/ADL transfers PT goals addressed during session: Mobility/safety with mobility OT goals addressed during session: ADL's and self-care      AM-PAC OT "6 Clicks" Daily Activity     Outcome Measure Help from another person eating meals?: A Little Help from another person taking care of personal grooming?: A Little Help from another person toileting, which includes using toliet, bedpan, or urinal?: A Lot Help from another person bathing (including washing, rinsing, drying)?: A Lot Help from another person to put on and taking off regular upper body clothing?: A Lot Help from another person to put on and taking off regular lower body clothing?: A Lot 6 Click Score: 14   End of Session Equipment Utilized During Treatment:  Gait belt;Rolling walker (2 wheels) Nurse Communication: Mobility status  Activity Tolerance: Patient tolerated treatment well;No increased pain Patient left: in chair;with call bell/phone within reach;with chair alarm set  OT Visit Diagnosis: Unsteadiness on feet (R26.81);Other abnormalities of gait and mobility (R26.89);Muscle weakness (generalized) (M62.81);History of falling  (Z91.81);Pain                Time: 1130-1153 OT Time Calculation (min): 23 min Charges:  OT General Charges $OT Visit: 1 Visit OT Evaluation $OT Eval Moderate Complexity: 1 Mod  Jackelyn Poling OTR/L, MS Acute Rehabilitation Department Office# [phone] Pager# [phone]   Marcellina Millin 06/10/2021, 1:07 PM

## 2021-06-11 MED ORDER — APIXABAN 5 MG PO TABS: ORAL_TABLET | ORAL | 0 refills | Status: DC

## 2021-06-11 MED ORDER — AMOXICILLIN-POT CLAVULANATE 875-125 MG PO TABS: 1.0000 | ORAL_TABLET | Freq: Two times a day (BID) | ORAL | 0 refills | Status: AC

## 2021-06-11 MED ORDER — ACETAMINOPHEN 500 MG PO TABS: 500.0000 mg | ORAL_TABLET | Freq: Three times a day (TID) | ORAL | Status: DC | PRN

## 2021-06-11 MED ORDER — GUAIFENESIN ER 600 MG PO TB12: 1200.0000 mg | ORAL_TABLET | Freq: Two times a day (BID) | ORAL | 0 refills | Status: AC

## 2021-06-11 NOTE — Progress Notes (Addendum)
SATURATION QUALIFICATIONS: (This note is used to comply with regulatory documentation for home oxygen)  Patient Saturations on Room Air at Rest = 96%  Patient Saturations on Room Air while Ambulating = 89%  Patient Saturations on 2 Liters of oxygen while Ambulating = 92%  Please briefly explain why patient needs home oxygen: short of breath while standing

## 2021-06-11 NOTE — Progress Notes (Signed)
PHYSICAL THERAPY  Meeting  Son Wed, Feb 22 @ 9am for family education on all mobility training/assist level/transfers etc as Son declines SNF and plans to take his dad home with him.  Pt has 2 Son's that can provide 24/7 care.  They have a wheelchair/walker/BSC.   Will need [place]  PTA Acute  Rehabilitation Services Pager      [phone] Office      [phone]

## 2021-06-11 NOTE — Progress Notes (Signed)
Physical Therapy Treatment Patient Details Name: Logan [name] MRN: [id number] DOB: 10/10/1945 Today's Date: 06/11/2021   History of Present Illness Logan [name] is a 76 y.o. male with a history of gout, atrial fibrillation, prediabetes, hypertension, DVT, hyperlipidemia. Patient on Eliquis prior to admission. Patient presented secondary to shortness of breath and was found to have an acute bilateral PE in addition to evidence of aspiration pneumonia    PT Comments    Son present during session for Education on safe handling as well as mobility.  General transfer comment: Son present for "training" on transfers and mobility.  Placed a gait belt around pt.  Educated on use.  Had Son attempt to transfer pt from recliner to [facility].  Pt was unable with + 1 asssit (Son) to rise.  Required + 2 assist to transfer onto [facility] with near fall during partial pivot.  Required + 2 assist to rise off BSC back to recliner.  Pt <25% able with MAX c/o weakness and fatigue.  Pt was OOB most of the day. General Gait Details: pt was unable to functioanlly take any steps.  Max c/o weakness/fatigue and inability to support self upright. Pt still requiring + 2 heavy assist.  Definitely won't be able to get in/out of a car.  Son and pt now agree to SNF but NOT [facility].  Son prefers [facility] in [place] (close to home). Will need to cancel D/C to home today.  Have sent a secure message to MD.   Recommendations for follow up therapy are one component of a multi-disciplinary discharge planning process, led by the attending physician.  Recommendations may be updated based on patient status, additional functional criteria and insurance authorization.  Follow Up Recommendations  Skilled nursing-short term rehab (<3 hours/day)     Assistance Recommended at Discharge Frequent or constant Supervision/Assistance  Patient can return home with the following Assistance with cooking/housework;Assist for transportation;Help with stairs or  ramp for entrance;Two people to help with walking and/or transfers;A lot of help with bathing/dressing/bathroom   Equipment Recommendations  None recommended by PT    Recommendations for Other Services       Precautions / Restrictions Precautions Precautions: Fall Precaution Comments: monitor  sats, New PE, "bad" R shoulder and "bad" R knee Restrictions Weight Bearing Restrictions: No Other Position/Activity Restrictions: WBAT     Mobility  Bed Mobility Overal bed mobility: Needs Assistance Bed Mobility: Supine to Sit     Supine to sit: Max assist, +2 for physical assistance, +2 for safety/equipment     General bed mobility comments: OOB in recliner    Transfers Overall transfer level: Needs assistance Equipment used: Rolling walker (2 wheels) Transfers: Sit to/from Stand, Bed to chair/wheelchair/BSC Sit to Stand: Mod assist, Max assist, +2 physical assistance, +2 safety/equipment Stand pivot transfers: Min assist, +2 physical assistance, +2 safety/equipment Step pivot transfers: +2 physical assistance, +2 safety/equipment, Max assist       General transfer comment: Son present for "training" on transfers and mobility.  Placed a gait belt around pt.  Educated on use.  Had Son attempt to transfer pt from recliner to [facility].  Pt was unable with + 1 asssit (Son) to rise.  Required + 2 assist to transfer onto [facility] with near fall during partial pivot.  Required + 2 assist to rise off BSC back to recliner.  Pt <25% able with MAX c/o weakness and fatigue.  Pt was OOB most of the day.    Ambulation/Gait Ambulation/Gait assistance: Min assist, +2  safety/equipment Gait Distance (Feet): 4 Feet Assistive device: Rolling walker (2 wheels) Gait Pattern/deviations: Step-to pattern, Decreased step length - left, Decreased step length - right Gait velocity: decreased     General Gait Details: pt was unable to functioanlly take any steps.  Max c/o weakness/fatigue and inability to support  self upright.   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        Exercises      General Comments        Pertinent Vitals/Pain Pain Assessment Pain Assessment: Faces Faces Pain Scale: Hurts little more Pain Location: L hip, R shoulder Pain Descriptors / Indicators: Discomfort, Aching, Grimacing Pain Intervention(s): Monitored during session, Repositioned    Home Living                          Prior Function            PT Goals (current goals can now be found in the care plan section) Progress towards PT goals: Progressing toward goals    Frequency    Min 5X/week      PT Plan Discharge plan needs to be updated    Co-evaluation              AM-PAC PT "6 Clicks" Mobility   Outcome Measure  Help needed turning from your back to your side while in a flat bed without using bedrails?: A Lot Help needed moving from lying on your back to sitting on the side of a flat bed without using bedrails?: A Lot Help needed moving to and from a bed to a chair (including a wheelchair)?: A Lot Help needed standing up from a chair using your arms (e.g., wheelchair or bedside chair)?: A Lot Help needed to walk in hospital room?: Total Help needed climbing 3-5 steps with a railing? : Total 6 Click Score: 10    End of Session Equipment Utilized During Treatment: Gait belt Activity Tolerance: Patient limited by fatigue Patient left: in chair;with call bell/phone within reach;with chair alarm set Nurse Communication: Mobility status PT Visit  Diagnosis: Other abnormalities of gait and mobility (R26.89);Muscle weakness (generalized) (M62.81);Difficulty in walking, not elsewhere classified (R26.2);History of falling (Z91.81)     Time: 1520-1605 PT Time Calculation (min) (ACUTE ONLY): 45 min  Charges:  $Gait Training: 8-22 mins $Therapeutic Activity: 38-52 mins                     {Mamta Rimmer  PTA Acute  Rehabilitation Services Pager      [phone] Office      [phone]

## 2021-06-11 NOTE — Discharge Summary (Signed)
Physician Discharge Summary   Patient: Logan [name] MRN: [id number] DOB: February 16, 1946  Admit date:     06/07/2021  Discharge date: 06/11/21  Discharge Physician: Cordelia Poche, MD   PCP: [name], MD   Recommendations at discharge:   Outpatient PCP follow-up Follow-up with previously scheduled orthopedic surgery follow-up  Discharge Diagnoses: Principal Problem:   Acute pulmonary embolism ([place]) Active Problems:   Primary osteoarthritis of left hip   Hypertension   Gout   Atrial fibrillation (HCC)   Macrocytic anemia   H/O total hip arthroplasty, left   Acute respiratory failure with hypoxia (HCC)   Aspiration pneumonia ([place])  Resolved Problems:   * No resolved hospital problems. *   Hospital Course: Logan [name] is a 76 y.o. male with a history of gout, atrial fibrillation, prediabetes, hypertension, DVT, hyperlipidemia. Patient on Eliquis prior to admission. Patient presented secondary to shortness of breath and was found to have an acute bilateral PE in addition to evidence of aspiration pneumonia. Heparin IV initiated and is now transitioned to back to Eliquis with load. Initial plan for discharge to rehab, however, family declined and patient is otherwise medically stable.  Assessment and Plan: * Acute pulmonary embolism ([place])- (present on admission) Bilateral PEs located in right upper lobe (subsegmental) and left upper lobe (segmental) without evidence of central/saddle embolism or right heart strain on imaging. Started on Heparin IV. Discussed case with hematology who thinks it is unlikely failure of Eliquis since patient held Eliquis for surgery followed by decrease in dose post-surgery. Transitioned to Eliquis and will continue on discharge.  Aspiration pneumonia ([place]) Noted to be likely on imaging. Patient with productive coughing. Afebrile but with an elevated WBC. Complicated by recent intubation for surgery. Started on Zosyn in the ED. Sputum culture  significant for respiratory flora. Patient transitioned to Unasyn and discharged on Augmentin. Flutter valve ordered. Mucinex ordered.  Acute respiratory failure with hypoxia (HCC) Secondary to acute PE and aspiration pneumonia. Initially hypoxic with documented SpO2 down to 84%. Patient initially required 15 L/min oxygen delivery and has improved. Weaned down to room air.  H/O total hip arthroplasty, left Prior to admission. WBAT LLE. Patient presented from SNF. PT/OT recommending SNF, however patient/family declined. Patient set up for home health.  Macrocytic anemia Normal Vitamin B12 and folate from 2022. Baseline hemoglobin of about 12 with post-operative hemoglobin of 9-10. Currently stable. Normal Vitamin B12/folate.  Atrial fibrillation ([place])- (present on admission) Rate controlled. Persistent atrial fibrillation per notes. On Coreg and Eliquis. Continue on discharge.  Gout- (present on admission) Continue allopurinol  Hypertension- (present on admission) Continue home Coreg and losartan.  Primary osteoarthritis of left hip- (present on admission) Patient is s/p total hip arthroplasty on 06/03/2021           Consultants: None Procedures performed: None  Disposition: Home health Diet recommendation:  Cardiac diet  DISCHARGE MEDICATION: Allergies as of 06/11/2021   No Known Allergies      Medication List     STOP taking these medications    acetaminophen 500 MG tablet Commonly known as: TYLENOL   potassium chloride 10 MEQ CR capsule Commonly known as: MICRO-K       TAKE these medications    allopurinol 300 MG tablet Commonly known as: ZYLOPRIM Take 300 mg by mouth daily.   amoxicillin-clavulanate 875-125 MG tablet Commonly known as: Augmentin Take 1 tablet by mouth 2 (two) times daily for 2 days.   apixaban 5 MG Tabs tablet Commonly known as: ELIQUIS Take  2 tablets (10 mg total) by mouth 2 (two) times daily for 4 days, THEN 1 tablet (5 mg  total) 2 (two) times daily. Start taking on: June 11, 2021 What changed:  medication strength See the new instructions.   carvedilol 12.5 MG tablet Commonly known as: COREG Take 1 tablet (12.5 mg total) by mouth 2 (two) times daily with a meal.   DHEA 25 MG Caps Take 25 mg by mouth daily.   diclofenac Sodium 1 % Gel Commonly known as: VOLTAREN Apply 2 g topically 4 (four) times daily. To left hip What changed:  when to take this reasons to take this   fluticasone 50 MCG/ACT nasal spray Commonly known as: FLONASE Place 1 spray into both nostrils daily as needed for rhinitis.   furosemide 40 MG tablet Commonly known as: LASIX Take 1 tablet (40 mg total) by mouth daily.   guaiFENesin 600 MG 12 hr tablet Commonly known as: MUCINEX Take 2 tablets (1,200 mg total) by mouth 2 (two) times daily for 5 days.   ICY HOT EX Apply 1 g topically daily as needed (pain).   loratadine 10 MG tablet Commonly known as: CLARITIN Take 10 mg by mouth daily as needed for allergies.   losartan 50 MG tablet Commonly known as: COZAAR Take 1 tablet (50 mg total) by mouth daily.   multivitamin tablet Take 1 tablet by mouth daily.   omeprazole 20 MG capsule Commonly known as: PRILOSEC Take 20 mg by mouth daily.   oxyCODONE-acetaminophen 5-325 MG tablet Commonly known as: PERCOCET/ROXICET Take 1 tablet by mouth every 4 (four) hours as needed for severe pain.   tiZANidine 2 MG tablet Commonly known as: ZANAFLEX Take 1 tablet (2 mg total) by mouth every 6 (six) hours as needed. What changed: reasons to take this   traZODone 50 MG tablet Commonly known as: DESYREL Take 1 tablet (50 mg total) by mouth at bedtime as needed for sleep.   V-R GAS RELIEF 80 MG chewable tablet Generic drug: simethicone Chew 2 tablets (160 mg total) by mouth 4 (four) times daily.   vitamin C 500 MG tablet Commonly known as: ASCORBIC ACID Take 1 tablet (500 mg total) by mouth daily.          Discharge Exam: Filed Weights   06/07/21 0830 06/08/21 0615  Weight: 72.1 kg 88.5 kg   General exam: Appears calm and comfortable Respiratory system: Some rhonchi. Respiratory effort normal. Cardiovascular system: S1 & S2 heard, RRR. No murmurs, rubs, gallops or clicks. Gastrointestinal system: Abdomen is nondistended, soft and nontender. Normal bowel sounds heard. Central nervous system: Alert and oriented. No focal neurological deficits. Musculoskeletal: No calf tenderness Skin: No cyanosis. No rashes Psychiatry: Judgement and insight appear normal. Mood & affect appropriate.   Condition at discharge: stable  The results of significant diagnostics from this hospitalization (including imaging, microbiology, ancillary and laboratory) are listed below for reference.   Imaging Studies: DG Chest 2 View  Result Date: 05/28/2021 CLINICAL DATA:  Preop evaluation, upcoming hip replacement EXAM: CHEST - 2 VIEW COMPARISON:  05/02/2021 FINDINGS: Cardiac shadow is within normal limits. Aortic calcifications are noted. Atelectatic changes are noted in the left base. No sizable infiltrate or effusion is noted. Degenerative changes of the thoracic spine are noted. IMPRESSION: Mild left basilar atelectasis. Electronically Signed   By: Inez Catalina M.D.   On: 05/28/2021 09:30   CT Angio Chest PE W and/or Wo Contrast  Result Date: 06/07/2021 CLINICAL DATA:  Pulmonary embolism (PE)  suspected, high prob EXAM: CT ANGIOGRAPHY CHEST WITH CONTRAST TECHNIQUE: Multidetector CT imaging of the chest was performed using the standard protocol during bolus administration of intravenous contrast. Multiplanar CT image reconstructions and MIPs were obtained to evaluate the vascular anatomy. RADIATION DOSE REDUCTION: This exam was performed according to the departmental dose-optimization program which includes automated exposure control, adjustment of the mA and/or kV according to patient size and/or use of iterative  reconstruction technique. CONTRAST:  140mL OMNIPAQUE IOHEXOL 350 MG/ML SOLN COMPARISON:  Radiograph 06/07/2021, right shoulder CT 01/15/2021 FINDINGS: Cardiovascular: Mild cardiomegaly. No pericardial disease. Coronary artery calcifications. Normal size main and branch pulmonary arteries. There is a right upper lobe subsegmental pulmonary embolism and a left upper lobe segmental and subsegmental pulmonary embolism (series 5, images 100 and 110-111). No central/saddle embolism. No evidence of right heart strain (RV: LV ratio is 0.8). There is moderate atherosclerotic calcifications of the aortic arch and descending aorta. Mediastinum/Nodes: No lymphadenopathy.The thyroid is unremarkable. The esophagus is unremarkable. Lungs/Pleura: There is endobronchial material within the left mainstem and lower lobe bronchus, with near complete left lower lobe collapse and areas of decreased density within the collapsed left lower lobe consistent with pneumonia. Trace respiratory secretions in the trachea.Right basilar atelectasis. Lingular subsegmental atelectasis. Small left and trace right pleural effusions. No pneumothorax. Upper Abdomen: Calcified hepatic granulomas. Musculoskeletal: There are subacute left posterior ninth, tenth, and eleventh rib fractures near the costovertebral junction.Anterior superior right glenohumeral dislocation and fragmented fracture of the coracoid. Dislocation was present on prior CT in September 22, but the fracture of the coracoid is new since that exam. There is severe left and mild-to-moderate right [place] joint arthritis with slight posterior subluxation of right medial clavicle with respect to the manubrium. Review of the MIP images confirms the above findings. IMPRESSION: Right upper lobe subsegmental and left upper lobe segmental and subsegmental pulmonary emboli. No central/saddle embolism. No CT evidence of right heart strain. Endobronchial material within the left mainstem and left lower  lobe bronchus, with near complete left lower lobe collapse and findings suggestive of left lower lobe aspiration pneumonia. Small left and trace right pleural effusions. Right basilar and lingular atelectasis. Subacute left posterior ninth, tenth, and eleventh rib fractures near the costovertebral junction. Anterior superior right glenohumeral dislocation with fragmented fracture of the coracoid. The right shoulder was dislocated on prior CT in September 22, but the coracoid fracture is new. Aortic Atherosclerosis (ICD10-I70.0). Critical Value/emergent results were called by telephone at the time of interpretation on 06/07/2021 at 10:06 am to provider taking care of the patient, who verbally acknowledged these results. Electronically Signed   By: Maurine Simmering M.D.   On: 06/07/2021 10:21   DG Chest Portable 1 View  Result Date: 06/07/2021 CLINICAL DATA:  Hypoxia, shortness of breath. EXAM: PORTABLE CHEST 1 VIEW COMPARISON:  May 27, 2021. FINDINGS: The heart size and mediastinal contours are within normal limits. Hypoinflation of the lungs is noted with mild bibasilar subsegmental atelectasis. The visualized skeletal structures are unremarkable. IMPRESSION: Hypoinflation of the lungs with mild bibasilar subsegmental atelectasis. Electronically Signed   By: Marijo Conception M.D.   On: 06/07/2021 08:51   DG C-Arm 1-60 Min-No Report  Result Date: 06/03/2021 Fluoroscopy was utilized by the requesting physician.  No radiographic interpretation.   DG HIP UNILAT WITH PELVIS 1V LEFT  Result Date: 06/08/2021 CLINICAL DATA:  Total hip arthroplasty EXAM: DG HIP (WITH OR WITHOUT PELVIS) 1V*L* COMPARISON:  Radiograph 06/06/2021 FINDINGS: Unchanged alignment of the left hip arthroplasty  without evidence of periprosthetic fracture. Retained contrast material in the bladder from recent CT scan. IMPRESSION: Unchanged left hip arthroplasty alignment without evidence of complication. Electronically Signed   By: Maurine Simmering  M.D.   On: 06/08/2021 09:08   DG HIP UNILAT WITH PELVIS 1V LEFT  Result Date: 06/06/2021 CLINICAL DATA:  History of left hip replacement on 06/03/2021 EXAM: DG HIP (WITH OR WITHOUT PELVIS) 1V*L* COMPARISON:  Intraoperative hip radiograph 06/03/2021 FINDINGS: Postsurgical changes reflecting left hip arthroplasty are again seen. Prosthesis is high riding within the acetabulum, similar to the intraoperative radiographs. There is no evidence of complication. Mild degenerative change about the right hip is noted. The SI joints and symphysis pubis are intact. IMPRESSION: Postsurgical changes reflecting left hip arthroplasty without evidence of complication. Electronically Signed   By: Valetta Mole M.D.   On: 06/06/2021 09:17   DG HIP OPERATIVE UNILAT WITH PELVIS LEFT  Result Date: 06/03/2021 CLINICAL DATA:  Fluoroscopy for anterior left hip replacement. EXAM: OPERATIVE  HIP (WITH PELVIS IF PERFORMED)  VIEWS TECHNIQUE: Fluoroscopic spot image(s) were submitted for interpretation post-operatively. COMPARISON:  Left hip radiographs 05/02/2021 FINDINGS: Images were performed intraoperatively without the presence of a radiologist. The patient appears to be undergoing total left hip arthroplasty. A single cerclage wire overlies the intertrochanteric region of the left femur. Please see intraoperative findings for further detail. IMPRESSION: Fluoroscopy for left hip arthroplasty. Electronically Signed   By: Yvonne Kendall M.D.   On: 06/03/2021 10:35   VAS [place] LOWER EXTREMITY VENOUS (DVT) (ONLY [facility] & [facility])  Result Date: 06/07/2021  Lower Venous DVT Study Patient Name:  MEKAI WILKINSON  Date of Exam:   06/07/2021 Medical Rec #: [id number]    Accession #:    [id number] Date of Birth: 1945/07/20    Patient Gender: M Patient Age:   66 years Exam Location:  [facility] Procedure:      VAS [place] LOWER EXTREMITY VENOUS (DVT) Referring Phys: Marda Stalker  --------------------------------------------------------------------------------  Indications: Pain.  Risk Factors: None identified. Limitations: Poor ultrasound/tissue interface. Comparison Study: No prior studies. Performing Technologist: Oliver Hum RVT  Examination Guidelines: A complete evaluation includes B-mode imaging, spectral Doppler, color Doppler, and power Doppler as needed of all accessible portions of each vessel. Bilateral testing is considered an integral part of a complete examination. Limited examinations for reoccurring indications may be performed as noted. The reflux portion of the exam is performed with the patient in reverse Trendelenburg.  +---------+---------------+---------+-----------+----------+--------------+  RIGHT     Compressibility Phasicity Spontaneity Properties Thrombus Aging  +---------+---------------+---------+-----------+----------+--------------+  CFV       Full            Yes       Yes                                    +---------+---------------+---------+-----------+----------+--------------+  SFJ       Full                                                             +---------+---------------+---------+-----------+----------+--------------+  FV Prox   Full                                                             +---------+---------------+---------+-----------+----------+--------------+  FV Mid    Full                                                             +---------+---------------+---------+-----------+----------+--------------+  FV Distal Full                                                             +---------+---------------+---------+-----------+----------+--------------+  PFV       Full                                                             +---------+---------------+---------+-----------+----------+--------------+  POP       Full            Yes       Yes                                     +---------+---------------+---------+-----------+----------+--------------+  PTV       Full                                                             +---------+---------------+---------+-----------+----------+--------------+  PERO      Full                                                             +---------+---------------+---------+-----------+----------+--------------+   +---------+---------------+---------+-----------+----------+--------------+  LEFT      Compressibility Phasicity Spontaneity Properties Thrombus Aging  +---------+---------------+---------+-----------+----------+--------------+  CFV       Full            Yes       Yes                                    +---------+---------------+---------+-----------+----------+--------------+  SFJ       Full                                                             +---------+---------------+---------+-----------+----------+--------------+  FV Prox   Full                                                             +---------+---------------+---------+-----------+----------+--------------+  FV Mid    Full                                                             +---------+---------------+---------+-----------+----------+--------------+  FV Distal Full                                                             +---------+---------------+---------+-----------+----------+--------------+  PFV       Full                                                             +---------+---------------+---------+-----------+----------+--------------+  POP       Full            Yes       Yes                                    +---------+---------------+---------+-----------+----------+--------------+  PTV       Full                                                             +---------+---------------+---------+-----------+----------+--------------+  PERO      Full                                                              +---------+---------------+---------+-----------+----------+--------------+    Summary: RIGHT: - There is no evidence of deep vein thrombosis in the lower extremity.  - No cystic structure found in the popliteal fossa.  LEFT: - There is no evidence of deep vein thrombosis in the lower extremity.  - No cystic structure found in the popliteal fossa.  *See table(s) above for measurements and observations.    Preliminary     Microbiology: Results for orders placed or performed during the hospital encounter of 06/07/21  Resp Panel by RT-PCR (Flu A&B, Covid) Nasopharyngeal Swab     Status: None   Collection Time: 06/07/21  8:24 AM   Specimen: Nasopharyngeal Swab; Nasopharyngeal(NP) swabs in vial transport medium  Result Value Ref Range Status   SARS Coronavirus 2 by RT PCR NEGATIVE NEGATIVE Final    Comment: (NOTE) SARS-CoV-2 target nucleic acids are NOT DETECTED.  The SARS-CoV-2 RNA is generally detectable in upper respiratory specimens during the acute phase of infection. The lowest concentration of SARS-CoV-2 viral copies this assay can detect is 138 copies/mL. A negative result does not preclude SARS-Cov-2 infection and should not be used as the sole basis  for treatment or other patient management decisions. A negative result may occur with  improper specimen collection/handling, submission of specimen other than nasopharyngeal swab, presence of viral mutation(s) within the areas targeted by this assay, and inadequate number of viral copies(<138 copies/mL). A negative result must be combined with clinical observations, patient history, and epidemiological information. The expected result is Negative.  Fact Sheet for Patients:  EntrepreneurPulse.com.au  Fact Sheet for Healthcare Providers:  IncredibleEmployment.be  This test is no t yet approved or cleared by the [place] FDA and  has been authorized for detection and/or diagnosis of SARS-CoV-2  by FDA under an Emergency Use Authorization (EUA). This EUA will remain  in effect (meaning this test can be used) for the duration of the COVID-19 declaration under Section 564(b)(1) of the Act, 21 U.S.C.section 360bbb-3(b)(1), unless the authorization is terminated  or revoked sooner.       Influenza A by PCR NEGATIVE NEGATIVE Final   Influenza B by PCR NEGATIVE NEGATIVE Final    Comment: (NOTE) The Xpert Xpress SARS-CoV-2/FLU/RSV plus assay is intended as an aid in the diagnosis of influenza from Nasopharyngeal swab specimens and should not be used as a sole basis for treatment. Nasal washings and aspirates are unacceptable for Xpert Xpress SARS-CoV-2/FLU/RSV testing.  Fact Sheet for Patients: EntrepreneurPulse.com.au  Fact Sheet for Healthcare Providers: IncredibleEmployment.be  This test is not yet approved or cleared by the [place] FDA and has been authorized for detection and/or diagnosis of SARS-CoV-2 by FDA under an Emergency Use Authorization (EUA). This EUA will remain in effect (meaning this test can be used) for the duration of the COVID-19 declaration under Section 564(b)(1) of the Act, 21 U.S.C. section 360bbb-3(b)(1), unless the authorization is terminated or revoked.  Performed at [facility], [place] [address], [place], [place] [address]   Expectorated Sputum Assessment w Gram Stain, Rflx to Resp Cult     Status: None   Collection Time: 06/07/21  5:42 PM   Specimen: Expectorated Sputum  Result Value Ref Range Status   Specimen Description EXPECTORATED SPUTUM  Final   Special Requests NONE  Final   Sputum evaluation   Final    THIS SPECIMEN IS ACCEPTABLE FOR SPUTUM CULTURE Performed at [facility], [place] [address], [place], [place] [address]    Report Status 06/07/2021 FINAL  Final  Culture, Respiratory w Gram Stain     Status: None   Collection Time: 06/07/21  5:42 PM  Result  Value Ref Range Status   Specimen Description   Final    EXPECTORATED SPUTUM Performed at [place] [address], [place], [place] [address]    Special Requests   Final    NONE Reflexed from [id number] Performed at [facility], [place] [address], [place], [place] [address]    Gram Stain   Final    FEW WBC PRESENT,BOTH PMN AND MONONUCLEAR RARE GRAM POSITIVE COCCI RARE GRAM NEGATIVE RODS    Culture   Final    RARE Normal respiratory flora-no Staph aureus or Pseudomonas seen Performed at [facility], [address] [place], [place] [address]    Report Status 06/09/2021 FINAL  Final    Labs: CBC: Recent Labs  Lab 06/05/21 0331 06/06/21 0319 06/07/21 0824 06/08/21 0320  WBC 12.6* 11.4* 11.3* 10.4  NEUTROABS  --   --  8.3*  --   HGB 9.0* 8.7* 9.7* 8.8*  HCT 27.9* 27.2* 30.8* 27.7*  MCV 102.2*  103.8* 103.4* 100.7*  PLT 218 224 281 518   Basic Metabolic Panel: Recent Labs  Lab 06/07/21 0824  NA 138  K 3.8  CL 100  CO2 33*  GLUCOSE 93  BUN 17  CREATININE 0.62  CALCIUM 8.8*   Liver Function Tests: Recent Labs  Lab 06/07/21 0824  AST 22  ALT 19  ALKPHOS 55  BILITOT 1.3*  PROT 5.8*  ALBUMIN 2.8*   CBG: Recent Labs  Lab 06/05/21 1410  GLUCAP 131*    Discharge time spent: 35 minutes.  Signed: Cordelia Poche, MD [facility] 06/11/2021

## 2021-06-11 NOTE — Discharge Instructions (Signed)
Information on my medicine - ELIQUIS (apixaban)  This medication education was reviewed with me or my healthcare representative as part of my discharge preparation.    Why was Eliquis prescribed for you? Eliquis was prescribed to treat blood clots that may have been found in the veins of your legs (deep vein thrombosis) or in your lungs (pulmonary embolism) and to reduce the risk of them occurring again.  What do You need to know about Eliquis ? The starting dose is 10 mg (two 5 mg tablets) taken TWICE daily for the FIRST SEVEN (7) DAYS, then on 06/15/21  the dose is reduced to ONE 5 mg tablet taken TWICE daily.  Eliquis may be taken with or without food.   Try to take the dose about the same time in the morning and in the evening. If you have difficulty swallowing the tablet whole please discuss with your pharmacist how to take the medication safely.  Take Eliquis exactly as prescribed and DO NOT stop taking Eliquis without talking to the doctor who prescribed the medication.  Stopping may increase your risk of developing a new blood clot.  Refill your prescription before you run out.  After discharge, you should have regular check-up appointments with your healthcare provider that is prescribing your Eliquis.    What do you do if you miss a dose? If a dose of ELIQUIS is not taken at the scheduled time, take it as soon as possible on the same day and twice-daily administration should be resumed. The dose should not be doubled to make up for a missed dose.  Important Safety Information A possible side effect of Eliquis is bleeding. You should call your healthcare provider right away if you experience any of the following: Bleeding from an injury or your nose that does not stop. Unusual colored urine (red or dark brown) or unusual colored stools (red or black). Unusual bruising for unknown reasons. A serious fall or if you hit your head (even if there is no bleeding).  Some  medicines may interact with Eliquis and might increase your risk of bleeding or clotting while on Eliquis. To help avoid this, consult your healthcare provider or pharmacist prior to using any new prescription or non-prescription medications, including herbals, vitamins, non-steroidal anti-inflammatory drugs (NSAIDs) and supplements.  This website has more information on Eliquis (apixaban): http://www.eliquis.com/eliquis/home

## 2021-06-11 NOTE — Progress Notes (Signed)
Physical Therapy Treatment Patient Details Name: Logan [name] MRN: [id number] DOB: 08/03/1945 Today's Date: 06/11/2021   History of Present Illness 76 yo male s/p L THA on 06/03/21. DC to SNF, Back to ED with SOB, bilateral PE, negative for DVT. PMH: HTN, ACDF C3-4, afib, HF, DM, obesity, sleep apnea and chronic pain.    PT Comments    POD # 8 L THR re admit from SNF New Dx PE Pt in bed on 3 lts nasal c/o "I can't breath".  Sats 96% with audible upper Bronchial congestion.  Assisted to EOB to use Flutter and encourage coughing.   General bed mobility comments: maxAx2 to bring hips around and scoot hips to EOB.  Very difficult due to "bad" R shoulder and recent L hip replacement.  Pt plans to sleep in a recliner once home as he did prior.  Son buying a newer bigger recliner. General transfer comment: allowed pt to attempt on his own.  Required several attempts to "rock" his upper body forward and push of which is difficult to due one "bad" shoulder.  First assisted from elevated bed to [facility] with walker and Min Assist to ensure he completed 1/4 turn.  With stand to sit, pt tends to "plop" uncontrolled as he lacks quad strength and R shoulder is "frozen". General Gait Details: + 2 assist for safety and applied shoes (better footing) pt was able to amb 4 feet straight from [facility] to sink with recliner following closely behind.  Pt present with poor forward flex posture, poor R knee flex and 4/10 L hip pain.  Unsteady.  Would NOT advise he amb on his own.  Pt positioned in recliner and left on RA with sats 92% and education on cont use of flutter/coughing.  Pt breathing improved with activity and upright posture.   Meeting  Son Wed, Feb 22 @ 9am for family education on all mobility training/assist level/transfers etc as Son declines SNF and plans to take his dad home with him.  Pt has 2 Son's that can provide 24/7 care.  They have a wheelchair/walker/BSC.   Will need Home Health   Recommendations for follow up  therapy are one component of a multi-disciplinary discharge planning process, led by the attending physician.  Recommendations may be updated based on patient status, additional functional criteria and insurance authorization.  Follow Up Recommendations  Home health PT (Son declines SNF)     Assistance Recommended at Discharge Frequent or constant Supervision/Assistance  Patient can return home with the following Assistance with cooking/housework;Assist for transportation;Help with stairs or ramp for entrance;Two people to help with walking and/or transfers;A lot of help with bathing/dressing/bathroom   Equipment Recommendations  None recommended by PT    Recommendations for Other Services       Precautions / Restrictions Precautions Precautions: Fall Precaution Comments: monitor  sats, New PE, "bad" R shoulder and "bad" R knee Restrictions Weight Bearing Restrictions: No Other Position/Activity Restrictions: WBAT     Mobility  Bed Mobility Overal bed mobility: Needs Assistance Bed Mobility: Supine to Sit     Supine to sit: Max assist, +2 for physical assistance, +2 for safety/equipment     General bed mobility comments: maxAx2 to bring hips around and scoot hips to EOB.  Very difficult due to "bad" R shoulder and recent L hip replacement.  Pt plans to sleep in a recliner once home as he did prior.  Son buying a newer bigger recliner.    Transfers Overall transfer level: Needs assistance  Equipment used: Rolling walker (2 wheels) Transfers: Sit to/from Stand, Bed to chair/wheelchair/BSC Sit to Stand: Mod assist, +2 safety/equipment Stand pivot transfers: Min assist, +2 physical assistance, +2 safety/equipment         General transfer comment: allowed pt to attempt on his own.  Required several attempts to "rock" his upper body forward and push of which is difficult to due one "bad" shoulder.  First assisted from elevated bed to [facility] with walker and Min Assist to ensure he  completed 1/4 turn.  With stand to sit, pt tends to "plop" uncontrolled as he lacks quad strength and R shoulder is "frozen".    Ambulation/Gait Ambulation/Gait assistance: Min assist, +2 safety/equipment Gait Distance (Feet): 4 Feet Assistive device: Rolling walker (2 wheels) Gait Pattern/deviations: Step-to pattern, Decreased step length - left, Decreased step length - right Gait velocity: decreased     General Gait Details: + 2 assist for safety and applied shoes (better footing) pt was able to amb 4 feet straight from [facility] to sink with recliner following closely behind.  Pt present with poor forward flex posture, poor R knee flex and 4/10 L hip pain.  Unsteady.  Would NOT advise he amb on his own.   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        Exercises      General Comments        Pertinent Vitals/Pain Pain Assessment Pain Assessment: Faces Faces Pain Scale: Hurts a little bit Pain Location: L hip, R shoulder Pain Descriptors / Indicators: Discomfort, Aching, Grimacing Pain Intervention(s): Monitored during session    Home Living                          Prior Function            PT Goals (current goals can now be found in the care plan section) Progress towards PT goals: Progressing toward goals    Frequency    Min 5X/week      PT Plan Current plan remains appropriate;Discharge plan needs to be updated    Co-evaluation              AM-PAC PT "6 Clicks" Mobility   Outcome Measure  Help needed turning from your  back to your side while in a flat bed without using bedrails?: A Lot Help needed moving from lying on your back to sitting on the side of a flat bed without using bedrails?: A Lot Help needed moving to and from a bed to a chair (including a wheelchair)?: A Lot Help needed standing up from a chair using your arms (e.g., wheelchair or bedside chair)?: A Lot Help needed to walk in hospital room?: A  Lot Help needed climbing 3-5 steps with a railing? : Total 6 Click Score: 11    End of Session Equipment Utilized During Treatment: Gait belt Activity Tolerance: Patient tolerated treatment well Patient left: in chair;with call bell/phone within reach;with chair alarm set Nurse Communication: Mobility status PT Visit Diagnosis: Other abnormalities of gait and mobility (R26.89);Muscle weakness (generalized) (M62.81);Difficulty in walking, not elsewhere classified (R26.2);History of falling (Z91.81)     Time: 0301-3143 PT Time Calculation (min) (ACUTE ONLY): 46 min  Charges:  $Gait Training: 8-22 mins $Therapeutic Activity: 23-37 mins                     Rica Koyanagi  PTA Acute  Rehabilitation Services Pager      [phone] Office      [phone]

## 2021-06-12 ENCOUNTER — Inpatient Hospital Stay (HOSPITAL_COMMUNITY): Payer: Medicare HMO

## 2021-06-12 DIAGNOSIS — I2602 Saddle embolus of pulmonary artery with acute cor pulmonale: Secondary | ICD-10-CM | POA: Diagnosis not present

## 2021-06-12 LAB — ECHOCARDIOGRAM COMPLETE
AR max vel: 2.85 cm2
AV Peak grad: 5.2 mmHg
Ao pk vel: 1.15 m/s
Area-P 1/2: 4.17 cm2
Height: 67 in
S' Lateral: 2.2 cm
Weight: 3121.71 oz

## 2021-06-12 MED ORDER — AMOXICILLIN-POT CLAVULANATE 875-125 MG PO TABS
1.0000 | ORAL_TABLET | Freq: Two times a day (BID) | ORAL | Status: DC
  Administered 2021-06-12 – 2021-06-13 (×3): 1 via ORAL
  Filled 2021-06-12 (×3): qty 1

## 2021-06-12 NOTE — Progress Notes (Signed)
Echocardiogram 2D Echocardiogram has been performed.  Jefferey Pica 06/12/2021, 2:32 PM

## 2021-06-12 NOTE — Plan of Care (Signed)

## 2021-06-12 NOTE — Progress Notes (Signed)
Physical Therapy Treatment Patient Details Name: Logan [name] MRN: [id number] DOB: October 29, 1945 Today's Date: 06/12/2021   History of Present Illness Logan [name] is a 76 y.o. male with a history of gout, atrial fibrillation, prediabetes, hypertension, DVT, hyperlipidemia. Patient on Eliquis prior to admission. Patient presented secondary to shortness of breath and was found to have an acute bilateral PE in addition to evidence of aspiration pneumonia    PT Comments    POD # 9 L THR post Op PE, Asp PNA. "I feel better today" stated pt.  "Was able to cough up a lot of flem".  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 Pt was incont stool in bed.  Assisted OOB to [facility] was difficult.  General bed mobility comments: required Max Assist due to limited B UE functional use "bad" R shoulder plus used bed pad to complete scooting to EOB.  Once upright, pt able to static sit EOB at Supervision level. General transfer comment: first assisted from elevated bed to [facility] required + 2 assist and increased time.  Severe posterior lean with tactile VC's to shift upper body weight anteriorly.  Heavy use of gait belt for balance as pt was able to complete 1/4 turn with VC's to reach back prior to sit and attempt to control decend.  Pt tends to "plop".  Extended time on BSC due to loose stools and MAX flutes.  Assisted again off BSC to amb required + 2 side by side assist.  Required Total Assist for peri care. And second assist for balance.  Again, severe posterior lean and VC's on proper hand tranfer from [facility] to walker without falling backward. General Gait Details: Required + 2 assist with a limited distance of 5 feet.  Posture is poor with R hip ext rotaed and knee flexed.  Limited WBing tolerance on L hip due to pain.  Recliner following closely behind.  Positioned in recliner upright ans set up lunch tray. Pt lives home alone and will need ST Rehab at SNF prior to safely returning.    Recommendations for follow up therapy are one component of a multi-disciplinary discharge planning process, led by the attending physician.  Recommendations may be updated based on patient status, additional functional criteria and insurance authorization.  Follow Up Recommendations  Skilled nursing-short term rehab (<3 hours/day)     Assistance Recommended at Discharge Frequent or constant Supervision/Assistance  Patient can return home with the following Assistance with cooking/housework;Assist for transportation;Help with stairs or ramp for entrance;Two people to help with walking and/or transfers;A lot of help with bathing/dressing/bathroom   Equipment Recommendations  None recommended by PT    Recommendations for Other Services       Precautions / Restrictions Precautions Precautions: Fall Precaution Comments: monitor  sats, New PE, "bad" R shoulder and "bad" R knee Restrictions Weight Bearing Restrictions: No Other Position/Activity Restrictions: WBAT     Mobility  Bed Mobility Overal bed mobility: Needs Assistance Bed Mobility: Supine to Sit     Supine to sit: Max assist, +2 for physical assistance, +2 for safety/equipment     General bed mobility comments: required Max Assist due to limited B UE functional use "bad" R shoulder plus used bed pad to complete scooting to EOB.  Once upright, pt able to static sit EOB at Supervision level.    Transfers Overall transfer level: Needs assistance Equipment used: Rolling walker (2 wheels) Transfers: Sit to/from Stand, Bed to chair/wheelchair/BSC Sit to Stand: Mod assist, Max  assist, +2 physical assistance, +2 safety/equipment Stand pivot transfers: Mod assist, Max assist, +2 physical assistance, +2 safety/equipment Step pivot transfers: +2 physical assistance, +2 safety/equipment, Max assist       General transfer comment: first assisted from elevated bed to [facility] required + 2 assist and increased time.  Severe posterior  lean with tactile VC's to shift upper body weight anteriorly.  Heavy use of gait belt for balance as pt was able to complete 1/4 turn with VC's to reach back prior to sit and attempt to control decend.  Pt tends to "plop".  Extended time on BSC due to loose stools and MAX flutes.  Assisted again off BSC to amb required + 2 side by side assist.  Again, severe posterior lean and VC's on proper hand tranfer from [facility] to walker without falling backward.    Ambulation/Gait Ambulation/Gait assistance: Mod assist, Max assist, +2 physical assistance, +2 safety/equipment Gait Distance (Feet): 5 Feet Assistive device: Rolling walker (2 wheels) Gait Pattern/deviations: Step-to pattern, Decreased step length - left, Decreased step length - right Gait velocity: decreased     General Gait Details: Required + 2 assist with a limited distance of 5 feet.  Posture is poor with R hip ext rotaed and knee flexed.  Limited WBing tolerance on L hip due to pain.  Recliner following closely behind.   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        Exercises      General Comments        Pertinent Vitals/Pain Pain Assessment Pain Assessment: Faces Faces Pain Scale: Hurts a little bit Pain Location: L hip, R shoulder Pain Descriptors / Indicators: Discomfort, Aching, Grimacing Pain Intervention(s): Monitored during session, Premedicated before session, Repositioned    Home Living                          Prior Function            PT Goals (current goals can now be found  in the care plan section) Progress towards PT goals: Progressing toward goals    Frequency    Min 5X/week      PT Plan Discharge plan needs to be updated    Co-evaluation              AM-PAC PT "6 Clicks" Mobility   Outcome Measure  Help needed turning from your back to your side while in a flat bed without using bedrails?: A Lot Help needed moving from lying on your back to sitting on the side of a flat bed without using bedrails?: A Lot Help needed moving to and from a bed to a chair (including a wheelchair)?: A Lot Help needed  standing up from a chair using your arms (e.g., wheelchair or bedside chair)?: A Lot Help needed to walk in hospital room?: A Lot Help needed climbing 3-5 steps with a railing? : Total 6 Click Score: 11    End of Session Equipment Utilized During Treatment: Gait belt Activity Tolerance: Patient limited by fatigue;No increased pain Patient left: in chair;with call bell/phone within reach;with chair alarm set Nurse Communication: Mobility status PT Visit Diagnosis: Other abnormalities of gait and mobility (R26.89);Muscle weakness (generalized) (M62.81);Difficulty in walking, not elsewhere classified (R26.2);History of falling (Z91.81)     Time: 1126-1206 PT Time Calculation (min) (ACUTE ONLY): 40 min  Charges:  $Therapeutic Activity: 38-52 mins                    Rica Koyanagi  PTA Acute  Rehabilitation Services Pager      [phone] Office      [phone]

## 2021-06-12 NOTE — Progress Notes (Signed)
76 yo with hx gout, atrial fib, prediabetes, HTN, DVT, HLD who presented with acute bilateral PE and aspiration pneumonia, he was treated with IV heparin and has now been trasnition back to eliquis with load.  Plan for discharge to rehab at this point which is currently pending auth.  Today's Vitals   06/12/21 0424 06/12/21 0822 06/12/21 0912 06/12/21 1313  BP: 118/63 117/61  (!) 106/48  Pulse: 66 65  67  Resp: 12 16  18   Temp: 97.7 F (36.5 C) 97.9 F (36.6 C)  97.9 F (36.6 C)  TempSrc: Oral Oral  Oral  SpO2: 93% 93%  97%  Weight:      Height:      PainSc:  8  4     Body mass index is 30.56 kg/m.  Izyan Ezzell/P Continuing on eliquis (hematology thought unlikely eliquis failure) Complete course for aspiration pneumonia with augmentin Stable at this time awaiting skilled facility/auth

## 2021-06-12 NOTE — TOC Progression Note (Addendum)
Transition of Care [facility]) - Progression Note    Patient Details  Name: Logan [name] MRN: [id number] Date of Birth: 06-08-1945  Transition of Care [facility]) CM/SW Contact  Bufford Helms, Marjie Skiff, RN Phone Number: 06/12/2021, 10:43 AM  Clinical Narrative:    After PT met with son late yesterday it was determined that pt would need short term SNF prior to going back home. Son and pt agreeable. FL2 faxed out to area facilities. SNF bed choice provided to son and pt. [employer]. [place] to initiate insurance auth as [employer] does not manage his plan. [facility] alerted that pt is ready to dc today. TOC will continue to follow.  Addendum: Son changed facility choice to [facility] after bed was offered at 11:40am. [facility] to initiate [employer]. [place] to discontinue the auth they started. Attempted to call son back and his VM box full.  Readmission Risk Interventions Readmission Risk Prevention Plan 01/03/2021  Post Dischage Appt Not Complete  Appt Comments going to INPT rehab  Medication Screening Complete  Transportation Screening Complete  Some recent data might be hidden

## 2021-06-12 NOTE — NC FL2 (Signed)
[place] LEVEL OF CARE SCREENING TOOL     IDENTIFICATION  Patient Name: Logan [name] Birthdate: [birth year] Sex: male Admission Date (Current Location): 06/07/2021  [facility] and [place] Number:  Herbalist and Address:  [facility],  [address], [place]      Provider Number: [id number]  Attending Physician Name and Address:  Elodia Florence., *  Relative Name and Phone Number:       Current Level of Care: Hospital Recommended Level of Care: [place] Prior Approval Number:    Date Approved/Denied:   PASRR Number: [id number]  Discharge Plan: SNF    Current Diagnoses: Patient Active Problem List   Diagnosis Date Noted   Acute pulmonary embolism ([place]) 06/07/2021   Acute respiratory failure with hypoxia ([place]) 06/07/2021   Aspiration pneumonia ([place]) 06/07/2021   H/O total hip arthroplasty, left 06/03/2021   Osteoarthritis 03/07/2021   Chronic incomplete quadriplegia ([place]) 03/04/2021   Abnormal gait 03/04/2021   Chronic pain syndrome 03/04/2021   Insomnia due to medical condition 03/04/2021   Shoulder subluxation, right 01/31/2021   Hypoalbuminemia due to protein-calorie malnutrition (HCC)    Hypokalemia    Macrocytic anemia    Prediabetes    Cervical disc disease with myelopathy 01/03/2021   Cervical myelopathy ([place]) 01/01/2021   Pneumonia 11/05/2020   Cervical radiculopathy 11/05/2020   Acute on chronic heart failure ([place]) 11/05/2020   Seasonal allergies    Orthopnea    Morbid obesity ([place])    DVT (deep venous thrombosis) ([place])    Bilateral lower extremity edema    Secondary hypercoagulable state ([place]) 03/28/2020   Persistent atrial fibrillation ([place]) 10/21/2019   Essential hypertension 10/21/2019   Chronic diastolic heart failure ([place]) 10/21/2019   Mixed hyperlipidemia 10/21/2019   Obesity (BMI 30-39.9) 08/24/2019   Sleep apnea    Primary osteoarthritis of left hip    Hypertension     Hyperlipidemia    Gout    Esophageal reflux    Diabetes mellitus without complication (HCC)    Atrial fibrillation (HCC)    Anxiety    Atherosclerotic renal artery stenosis, unilateral (HCC) 02/28/2014    Orientation RESPIRATION BLADDER Height & Weight     Self, Time, Situation, Place  Normal Incontinent Weight: 88.5 kg Height:  5\' 7"  (170.2 cm)  BEHAVIORAL SYMPTOMS/MOOD NEUROLOGICAL BOWEL NUTRITION STATUS      Continent Diet (Heart Healthy)  AMBULATORY STATUS COMMUNICATION OF NEEDS Skin   Extensive Assist Verbally Bruising                       Personal Care Assistance Level of Assistance  Bathing, Feeding, Dressing Bathing Assistance: Limited assistance Feeding assistance: Limited assistance Dressing Assistance: Limited assistance     Functional Limitations Info  Sight, Hearing, Speech Sight Info: Adequate Hearing Info: Adequate Speech Info: Adequate    SPECIAL CARE FACTORS FREQUENCY  PT (By licensed PT), OT (By licensed OT)     PT Frequency: 5 x week OT Frequency: 5 x week            Contractures Contractures Info: Not present    Additional Factors Info  Code Status, Allergies Code Status Info: Full Allergies Info: None known           Current Medications (06/12/2021):  This is the current hospital active medication list Current Facility-Administered Medications  Medication Dose Route Frequency Provider Last Rate Last Admin   acetaminophen (TYLENOL) tablet 650 mg  650 mg Oral Q6H PRN Mariel Aloe, MD   650 mg at 06/10/21 1032   Or   acetaminophen (TYLENOL) suppository 650 mg  650 mg Rectal Q6H PRN Mariel Aloe, MD       allopurinol (ZYLOPRIM) tablet 300 mg  300 mg Oral Daily Mariel Aloe, MD   300 mg at 06/11/21 0931   apixaban (ELIQUIS) tablet 10 mg  10 mg Oral BID Dimple Nanas, RPH   10 mg at 06/11/21 2200   Followed by   Derrill Memo ON 06/15/2021] apixaban (ELIQUIS) tablet 5 mg  5 mg Oral BID Dimple Nanas, RPH       ascorbic  acid (VITAMIN C) tablet 500 mg  500 mg Oral Daily Mariel Aloe, MD   500 mg at 06/11/21 4097   benzonatate (TESSALON) capsule 200 mg  200 mg Oral TID PRN Hosie Poisson, MD       carvedilol (COREG) tablet 12.5 mg  12.5 mg Oral BID WC Mariel Aloe, MD   12.5 mg at 06/12/21 0827   diclofenac Sodium (VOLTAREN) 1 % topical gel 2 g  2 g Topical QID PRN Mariel Aloe, MD       guaiFENesin (MUCINEX) 12 hr tablet 1,200 mg  1,200 mg Oral BID Mariel Aloe, MD   1,200 mg at 06/11/21 2200   guaiFENesin-dextromethorphan (ROBITUSSIN DM) 100-10 MG/5ML syrup 10 mL  10 mL Oral Q4H PRN Hosie Poisson, MD   10 mL at 06/11/21 0015   hydrALAZINE (APRESOLINE) tablet 25 mg  25 mg Oral Q6H PRN Mariel Aloe, MD       ipratropium-albuterol (DUONEB) 0.5-2.5 (3) MG/3ML nebulizer solution 3 mL  3 mL Nebulization Q6H PRN Hosie Poisson, MD   3 mL at 06/11/21 0043   loratadine (CLARITIN) tablet 10 mg  10 mg Oral Daily PRN Mariel Aloe, MD       losartan (COZAAR) tablet 50 mg  50 mg Oral Daily Mariel Aloe, MD   50 mg at 06/11/21 0930   multivitamin with minerals tablet 1 tablet  1 tablet Oral Daily Mariel Aloe, MD   1 tablet at 06/11/21 0931   ondansetron (ZOFRAN) tablet 4 mg  4 mg Oral Q6H PRN Mariel Aloe, MD       Or   ondansetron (ZOFRAN) injection 4 mg  4 mg Intravenous Q6H PRN Mariel Aloe, MD       oxyCODONE-acetaminophen (PERCOCET/ROXICET) 5-325 MG per tablet 1 tablet  1 tablet Oral Q4H PRN Mariel Aloe, MD   1 tablet at 06/12/21 0827   pantoprazole (PROTONIX) EC tablet 40 mg  40 mg Oral Daily Mariel Aloe, MD   40 mg at 06/11/21 0931   tiZANidine (ZANAFLEX) tablet 2 mg  2 mg Oral Q6H PRN Mariel Aloe, MD       traZODone (DESYREL) tablet 50 mg  50 mg Oral QHS PRN Mariel Aloe, MD         Discharge Medications: Please see discharge summary for a list of discharge medications.  Relevant Imaging Results:  Relevant Lab Results:   Additional Information SS# [id number]; Covid  Vax x 2 (no boosters yet)  Damarie Schoolfield, Marjie Skiff, RN

## 2021-06-13 DIAGNOSIS — R062 Wheezing: Secondary | ICD-10-CM | POA: Diagnosis not present

## 2021-06-13 DIAGNOSIS — D6869 Other thrombophilia: Secondary | ICD-10-CM | POA: Diagnosis not present

## 2021-06-13 DIAGNOSIS — Z79899 Other long term (current) drug therapy: Secondary | ICD-10-CM | POA: Diagnosis not present

## 2021-06-13 DIAGNOSIS — I10 Essential (primary) hypertension: Secondary | ICD-10-CM | POA: Diagnosis not present

## 2021-06-13 DIAGNOSIS — J811 Chronic pulmonary edema: Secondary | ICD-10-CM | POA: Diagnosis not present

## 2021-06-13 DIAGNOSIS — Z7401 Bed confinement status: Secondary | ICD-10-CM | POA: Diagnosis not present

## 2021-06-13 DIAGNOSIS — S42133A Displaced fracture of coracoid process, unspecified shoulder, initial encounter for closed fracture: Secondary | ICD-10-CM

## 2021-06-13 DIAGNOSIS — R0902 Hypoxemia: Secondary | ICD-10-CM | POA: Diagnosis not present

## 2021-06-13 DIAGNOSIS — F29 Unspecified psychosis not due to a substance or known physiological condition: Secondary | ICD-10-CM | POA: Diagnosis not present

## 2021-06-13 DIAGNOSIS — M1612 Unilateral primary osteoarthritis, left hip: Secondary | ICD-10-CM | POA: Diagnosis not present

## 2021-06-13 DIAGNOSIS — I4891 Unspecified atrial fibrillation: Secondary | ICD-10-CM | POA: Diagnosis not present

## 2021-06-13 DIAGNOSIS — M25552 Pain in left hip: Secondary | ICD-10-CM | POA: Diagnosis not present

## 2021-06-13 DIAGNOSIS — S2249XA Multiple fractures of ribs, unspecified side, initial encounter for closed fracture: Secondary | ICD-10-CM

## 2021-06-13 DIAGNOSIS — I2699 Other pulmonary embolism without acute cor pulmonale: Secondary | ICD-10-CM | POA: Diagnosis not present

## 2021-06-13 DIAGNOSIS — J690 Pneumonitis due to inhalation of food and vomit: Secondary | ICD-10-CM | POA: Diagnosis not present

## 2021-06-13 DIAGNOSIS — I5020 Unspecified systolic (congestive) heart failure: Secondary | ICD-10-CM | POA: Diagnosis not present

## 2021-06-13 DIAGNOSIS — Z9889 Other specified postprocedural states: Secondary | ICD-10-CM | POA: Diagnosis not present

## 2021-06-13 DIAGNOSIS — U071 COVID-19: Secondary | ICD-10-CM | POA: Diagnosis not present

## 2021-06-13 DIAGNOSIS — J1282 Pneumonia due to coronavirus disease 2019: Secondary | ICD-10-CM | POA: Diagnosis not present

## 2021-06-13 HISTORY — DX: Multiple fractures of ribs, unspecified side, initial encounter for closed fracture: S22.49XA

## 2021-06-13 HISTORY — DX: Displaced fracture of coracoid process, unspecified shoulder, initial encounter for closed fracture: S42.133A

## 2021-06-13 LAB — BASIC METABOLIC PANEL
Anion gap: 3 — ABNORMAL LOW (ref 5–15)
BUN: 19 mg/dL (ref 8–23)
CO2: 27 mmol/L (ref 22–32)
Calcium: 8.2 mg/dL — ABNORMAL LOW (ref 8.9–10.3)
Chloride: 105 mmol/L (ref 98–111)
Creatinine, Ser: 0.72 mg/dL (ref 0.61–1.24)
GFR, Estimated: >60 mL/min (ref >60)
Glucose, Bld: 87 mg/dL (ref 70–99)
Potassium: 3.6 mmol/L (ref 3.5–5.1)
Sodium: 135 mmol/L (ref 135–145)

## 2021-06-13 LAB — CBC
HCT: 27.5 % — ABNORMAL LOW (ref 39.0–52.0)
Hemoglobin: 8.8 g/dL — ABNORMAL LOW (ref 13.0–17.0)
MCH: 33.2 pg (ref 26.0–34.0)
MCHC: 32 g/dL (ref 30.0–36.0)
MCV: 103.8 fL — ABNORMAL HIGH (ref 80.0–100.0)
Platelets: 330 10*3/uL (ref 150–400)
RBC: 2.65 MIL/uL — ABNORMAL LOW (ref 4.22–5.81)
RDW: 15.9 % — ABNORMAL HIGH (ref 11.5–15.5)
WBC: 11.4 10*3/uL — ABNORMAL HIGH (ref 4.0–10.5)
nRBC: 0.2 % (ref 0.0–0.2)

## 2021-06-13 LAB — MAGNESIUM: Magnesium: 2.2 mg/dL (ref 1.7–2.4)

## 2021-06-13 LAB — PHOSPHORUS: Phosphorus: 3.7 mg/dL (ref 2.5–4.6)

## 2021-06-13 MED ORDER — LACTATED RINGERS IV BOLUS
500.0000 mL | Freq: Once | INTRAVENOUS | Status: AC
  Administered 2021-06-13: 500 mL via INTRAVENOUS

## 2021-06-13 MED ORDER — APIXABAN 5 MG PO TABS: ORAL_TABLET | ORAL | 0 refills | Status: DC

## 2021-06-13 NOTE — Progress Notes (Signed)
Pt called son to inform him that [facility] is picking him up now and going to [facility]. All pts belongings with pt to go to [facility]

## 2021-06-13 NOTE — Progress Notes (Signed)
Report called to [place] at [facility]. All questions answered.  AVS placed in discharge packet to go to [facility] via transport of [facility].

## 2021-06-13 NOTE — Progress Notes (Signed)
Physical Therapy Treatment Patient Details Name: Logan [name] MRN: [id number] DOB: 07/04/1945 Today's Date: 06/13/2021   History of Present Illness Dmarco Baldus is a 76 y.o. male with a history of gout, atrial fibrillation, prediabetes, hypertension, DVT, hyperlipidemia. Patient on Eliquis prior to admission. Patient presented secondary to shortness of breath and was found to have an acute bilateral PE in addition to evidence of aspiration pneumonia    PT Comments    POD #10 L THR post Op PE + Asp PNA Pt was OOB in recliner via Occupational Therapy.  Pt c/o "my buttocks hurt" and ready to get back to bed.  General transfer comment: required increased assist this afternoon due to fatigue.  Pt was OOB in recliner via OT earlier today.  Assisted up from recliner was difficult.  Instructed pt to rock forward 3 times then push up from arm rest.  Therapist had to use gait belt to pull weight forward to rise.  Very unsteady esp when pt transfers his hands from recliner arm rest to walker.  Poor standing balance.  R knne is flexed and R shoulder is weak.  Also assisted on/odd BSC. General Gait Details: Required + 2 assist with a limited distance of 10 feet.  Posture is poor with R hip ext rotaed and knee flexed.  Limited WBing tolerance on L hip due to pain.  Recliner following closely behind. General bed mobility comments: Max Assist + 2 back to bed and position to comfort.  Pt plans to D/C SNF as he lives home alone.   Recommendations for follow up therapy are one component of a multi-disciplinary discharge planning process, led by the attending physician.  Recommendations may be updated based on patient status, additional functional criteria and insurance authorization.  Follow Up Recommendations  Skilled nursing-short term rehab (<3 hours/day)     Assistance Recommended at Discharge    Patient can return home with the following Assistance with cooking/housework;Assist for transportation;Help with  stairs or ramp for entrance;Two people to help with walking and/or transfers;A lot of help with bathing/dressing/bathroom   Equipment Recommendations  None recommended by PT    Recommendations for Other Services       Precautions / Restrictions Precautions Precautions: Fall Precaution Comments: New PE, "bad" R shoulder and "bad" R knee Restrictions Weight Bearing Restrictions: No Other Position/Activity Restrictions: WBAT     Mobility  Bed Mobility Overal bed mobility: Needs Assistance Bed Mobility: Sit to Supine     Supine to sit: Max assist     General bed mobility comments: Max Assist + 2 back to bed and position to comfort.    Transfers Overall transfer level: Needs assistance Equipment used: Rolling walker (2 wheels) Transfers: Sit to/from Stand, Bed to chair/wheelchair/BSC Sit to Stand: +2 physical assistance, +2 safety/equipment, Max assist Stand pivot transfers: Max assist, +2 physical assistance, +2 safety/equipment, Total assist Step pivot transfers: +2 physical assistance, +2 safety/equipment, Max assist, Total assist       General transfer comment: required increased assist this afternoon due to fatigue.  Pt was OOB in recliner via OT earlier today.  Assisted up from recliner was difficult.  Instructed pt to rock forward 3 times then push up from arm rest.  Therapist had to use gait belt to pull weight forward to rise.  Very unsteady esp when pt transfers his hands from recliner arm rest to walker.  Poor standing balance.  R knne is flexed and R shoulder is weak.  Also assisted on/odd BSC.  Ambulation/Gait Ambulation/Gait assistance: Mod assist, Max assist, +2 physical assistance, +2 safety/equipment Gait Distance (Feet): 10 Feet Assistive device: Rolling walker (2 wheels) Gait Pattern/deviations: Step-to pattern, Decreased step length - left, Decreased step length - right Gait velocity: decreased     General Gait Details: Required + 2 assist with a  limited distance of 10 feet.  Posture is poor with R hip ext rotaed and knee flexed.  Limited WBing tolerance on L hip due to pain.  Recliner following closely behind.   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        Exercises      General Comments        Pertinent Vitals/Pain Pain Assessment Pain Assessment: Faces Faces Pain Scale: Hurts little more Pain Location: L mid thigh hip after sitting on BSC extended time Pain Descriptors / Indicators: Sore, Discomfort, Tingling Pain Intervention(s): Monitored during session, Repositioned    Home Living                          Prior Function            PT Goals (current goals can now be found in the care plan section) Progress towards PT goals: Progressing toward goals    Frequency    Min 5X/week      PT Plan Discharge plan needs to be updated    Co-evaluation              AM-PAC PT "6 Clicks" Mobility   Outcome Measure  Help needed turning from your back to your side while in a flat bed without using bedrails?: A Lot Help needed moving from lying on your back to sitting on the side of a flat bed without using bedrails?: A Lot Help needed moving to and from a bed to a chair (including a wheelchair)?: A Lot Help needed standing up from a chair using your arms (e.g., wheelchair or bedside chair)?: A Lot Help needed to walk in hospital room?: A Lot Help needed climbing 3-5 steps with a railing? : Total 6 Click Score: 11    End of Session Equipment Utilized During Treatment: Gait  belt Activity Tolerance: Patient limited by fatigue;No increased pain Patient left: in bed;with bed alarm set;with call bell/phone within reach Nurse Communication: Mobility status PT Visit Diagnosis: Other abnormalities of gait and mobility (R26.89);Muscle weakness (generalized) (M62.81);Difficulty in walking, not elsewhere classified (R26.2);History of falling (Z91.81)     Time: 1410-1450 PT Time Calculation (min) (ACUTE ONLY): 40 min  Charges:  $Gait Training: 8-22 mins $Therapeutic Activity: 23-37 mins                     Rica Koyanagi  PTA Acute  Rehabilitation Services Pager      [phone] Office      [phone]

## 2021-06-13 NOTE — Assessment & Plan Note (Signed)
Subacute, follow outpatient

## 2021-06-13 NOTE — Discharge Summary (Signed)
Physician Discharge Summary  Logan [name] HGD:[id number] DOB: 25-Dec-1945 DOA: 06/07/2021  PCP: [name], MD  Admit date: 06/07/2021 Discharge date: 06/13/2021  Time spent: 40 minutes  Recommendations for Outpatient Follow-up:  Follow outpatient CBC/CMP Follow with orthopedics outpatient Follow with PCP vs heme outpatient to discuss long term eliquis anticoagulation plan, needs uninterrupted anticoagulation at least 3-6 months Soft BP 2/23, coreg and losartan discontinued - consider restarting BP meds outpatient as tolerated   Discharge Diagnoses:  Principal Problem:   Acute pulmonary embolism ([place]) Active Problems:   Acute respiratory failure with hypoxia (HCC)   Aspiration pneumonia ([place])   Hypertension   Primary osteoarthritis of left hip   H/O total hip arthroplasty, left   Atrial fibrillation ([place])   Closed coracoid process fracture   Rib fractures   Gout   Macrocytic anemia   Discharge Condition: stable  Diet recommendation: heart healthy  Filed Weights   06/07/21 0830 06/08/21 0615  Weight: 72.1 kg 88.5 kg    History of present illness:  Logan [name] is Logan [name] 76 y.o. male with Logan [name] history of gout, atrial fibrillation, prediabetes, hypertension, DVT, hyperlipidemia. Patient on Eliquis prior to admission. Patient presented secondary to shortness of breath and was found to have an acute bilateral PE in addition to evidence of aspiration pneumonia. Heparin IV initiated and is now transitioned to back to Eliquis with load.   Planning to discharge to SNF today.  See below for additional details  Hospital Course:  Assessment and Plan: * Acute pulmonary embolism ([place])- (present on admission) Provoked in setting of recent surgery, holding eliquis around surgery CT with RUL subsegmental and LUL segmental and subsegmental pulmonary emboli Echo with normal RVSF, normal PASP, EF 60-65% LE [place] without DVT Transitioned from heparin back to eliquis (heme doubts eliquis  failure in setting of needing to hold eliquis for surgery followed by decreased dose post op) Will discharge with eliquis, load.  Needs at least 3-6 months uninterrupted anticoagulation, will need to discuss with PCP vs heme long term anticoagulation plan.  Acute respiratory failure with hypoxia (HCC) Secondary to acute PE and aspiration pneumonia. Initially hypoxic with documented SpO2 down to 84%. Patient initially required 15 L/min oxygen delivery and has improved. Weaned down to room air.  Aspiration pneumonia ([place]) Noted to be likely on imaging. Patient with productive coughing. Afebrile but with an elevated WBC. Complicated by recent intubation for surgery. Started on Zosyn in the ED. Sputum culture significant for respiratory flora. Patient transitioned to Unasyn and discharged on Augmentin. Flutter valve ordered. Mucinex ordered.  Hypertension- (present on admission) Hypotension today, hold losartan and coreg for now Consider resumption outpatient as tolerated BP's improved at time of discharge   H/O total hip arthroplasty, left Follow with ortho outpatient as planned  Primary osteoarthritis of left hip- (present on admission) Patient is s/p total hip arthroplasty on 06/03/2021 Follow with orthopedics as planned  Atrial fibrillation ([place])- (present on admission) Rate controlled. Persistent atrial fibrillation per notes.  Holding coreg at discharge Continue eliquis  Rib fractures Subacute, follow outpatient   Closed coracoid process fracture No interventions per ortho, no restrictions Follow outpatient  Macrocytic anemia Normal Vitamin B12 and folate from 2022. Baseline hemoglobin of about 12 with post-operative hemoglobin of 9-10. Currently stable. Normal Vitamin B12/folate.  Gout- (present on admission) Continue allopurinol       Procedures: Echo IMPRESSIONS     1. Left ventricular ejection fraction, by estimation, is 60 to 65%. The  left ventricle has normal  function. The left ventricle has no regional  wall motion abnormalities. Left ventricular diastolic parameters are  indeterminate.   2. Right ventricular systolic function is normal. The right ventricular  size is normal. There is normal pulmonary artery systolic pressure.   3. The mitral valve is normal in structure. No evidence of mitral valve  regurgitation. No evidence of mitral stenosis.   4. The aortic valve was not well visualized. There is mild calcification  of the aortic valve. Aortic valve regurgitation is not visualized. No  aortic stenosis is present.   5. The inferior vena cava is normal in size with greater than 50%  respiratory variability, suggesting right atrial pressure of 3 mmHg.  LE [place] Summary:  RIGHT:  - There is no evidence of deep vein thrombosis in the lower extremity.     - No cystic structure found in the popliteal fossa.     LEFT:  - There is no evidence of deep vein thrombosis in the lower extremity.     - No cystic structure found in the popliteal fossa.   Consultations: orthopedics  Discharge Exam: Vitals:   06/13/21 0935 06/13/21 1326  BP: (!) 112/51 129/70  Pulse: (!) 55 67  Resp:  17  Temp:  97.6 F (36.4 C)  SpO2:  97%   No new complaints  General: No acute distress. Cardiovascular: RRR Lungs: unlabored Abdomen: Soft, nontender, nondistended Neurological: Alert and oriented 3. Moves all extremities 4 . Cranial nerves II through XII grossly intact. Skin: Warm and dry. No rashes or lesions. Extremities: No clubbing or cyanosis. No edema.  Discharge Instructions   Discharge Instructions     Call MD for:  difficulty breathing, headache or visual disturbances   Complete by: As directed    Call MD for:  extreme fatigue   Complete by: As directed    Call MD for:  hives   Complete by: As directed    Call MD for:  persistant dizziness or light-headedness   Complete by: As directed    Call MD for:  persistant nausea and vomiting    Complete by: As directed    Call MD for:  redness, tenderness, or signs of infection (pain, swelling, redness, odor or green/yellow discharge around incision site)   Complete by: As directed    Call MD for:  severe uncontrolled pain   Complete by: As directed    Call MD for:  temperature >100.4   Complete by: As directed    Diet - low sodium heart healthy   Complete by: As directed    Discharge instructions   Complete by: As directed    You were seen for blood clots in your lungs.  You've improved after anticoagulation and we've restarted your eliquis.  Continue your eliquis for another 10 mg twice daily for another 2 days, then transition back to eliquis 5 mg twice daily.  You'll need to be on this uninterrupted for at least 3-6 months, discuss with your PCP the long term plan regarding your eliquis.  We've treated you with augmentin for aspiration pneumonia.  You'll go home with another 2 days of antibiotics.  Your blood pressure was low on 2/23.  We stopped your coreg and losartan.  Please follow up with your PCP regarding whether these medicines should be resumed outpatient.  Follow up with orthopedics outpatient as planned for your hip arthroplasty.  Return for new, recurrent, or worsening symptoms.  Please ask your PCP to request records from this hospitalization so  they know what was done and what the next steps will be.   Increase activity slowly   Complete by: As directed    No wound care   Complete by: As directed       Allergies as of 06/13/2021   No Known Allergies      Medication List     STOP taking these medications    acetaminophen 500 MG tablet Commonly known as: TYLENOL   carvedilol 12.5 MG tablet Commonly known as: COREG   losartan 50 MG tablet Commonly known as: COZAAR   potassium chloride 10 MEQ CR capsule Commonly known as: MICRO-K       TAKE these medications    allopurinol 300 MG tablet Commonly known as: ZYLOPRIM Take 300 mg by  mouth daily.   amoxicillin-clavulanate 875-125 MG tablet Commonly known as: Augmentin Take 1 tablet by mouth 2 (two) times daily for 2 days.   apixaban 5 MG Tabs tablet Commonly known as: ELIQUIS Take 2 tablets (10 mg total) by mouth 2 (two) times daily for 2 days, THEN 1 tablet (5 mg total) 2 (two) times daily. Start taking on: June 13, 2021 What changed:  medication strength See the new instructions.   DHEA 25 MG Caps Take 25 mg by mouth daily.   diclofenac Sodium 1 % Gel Commonly known as: VOLTAREN Apply 2 g topically 4 (four) times daily. To left hip What changed:  when to take this reasons to take this   fluticasone 50 MCG/ACT nasal spray Commonly known as: FLONASE Place 1 spray into both nostrils daily as needed for rhinitis.   furosemide 40 MG tablet Commonly known as: LASIX Take 1 tablet (40 mg total) by mouth daily.   guaiFENesin 600 MG 12 hr tablet Commonly known as: MUCINEX Take 2 tablets (1,200 mg total) by mouth 2 (two) times daily for 5 days.   ICY HOT EX Apply 1 g topically daily as needed (pain).   loratadine 10 MG tablet Commonly known as: CLARITIN Take 10 mg by mouth daily as needed for allergies.   multivitamin tablet Take 1 tablet by mouth daily.   omeprazole 20 MG capsule Commonly known as: PRILOSEC Take 20 mg by mouth daily.   oxyCODONE-acetaminophen 5-325 MG tablet Commonly known as: PERCOCET/ROXICET Take 1 tablet by mouth every 4 (four) hours as needed for severe pain.   tiZANidine 2 MG tablet Commonly known as: ZANAFLEX Take 1 tablet (2 mg total) by mouth every 6 (six) hours as needed. What changed: reasons to take this   traZODone 50 MG tablet Commonly known as: DESYREL Take 1 tablet (50 mg total) by mouth at bedtime as needed for sleep.   V-R GAS RELIEF 80 MG chewable tablet Generic drug: simethicone Chew 2 tablets (160 mg total) by mouth 4 (four) times daily.   vitamin C 500 MG tablet Commonly known as: ASCORBIC  ACID Take 1 tablet (500 mg total) by mouth daily.       No Known Allergies    The results of significant diagnostics from this hospitalization (including imaging, microbiology, ancillary and laboratory) are listed below for reference.    Significant Diagnostic Studies: DG Chest 2 View  Result Date: 05/28/2021 CLINICAL DATA:  Preop evaluation, upcoming hip replacement EXAM: CHEST - 2 VIEW COMPARISON:  05/02/2021 FINDINGS: Cardiac shadow is within normal limits. Aortic calcifications are noted. Atelectatic changes are noted in the left base. No sizable infiltrate or effusion is noted. Degenerative changes of the thoracic spine are noted. IMPRESSION:  Mild left basilar atelectasis. Electronically Signed   By: Inez Catalina M.D.   On: 05/28/2021 09:30   CT Angio Chest PE W and/or Wo Contrast  Result Date: 06/07/2021 CLINICAL DATA:  Pulmonary embolism (PE) suspected, high prob EXAM: CT ANGIOGRAPHY CHEST WITH CONTRAST TECHNIQUE: Multidetector CT imaging of the chest was performed using the standard protocol during bolus administration of intravenous contrast. Multiplanar CT image reconstructions and MIPs were obtained to evaluate the vascular anatomy. RADIATION DOSE REDUCTION: This exam was performed according to the departmental dose-optimization program which includes automated exposure control, adjustment of the mA and/or kV according to patient size and/or use of iterative reconstruction technique. CONTRAST:  128mL OMNIPAQUE IOHEXOL 350 MG/ML SOLN COMPARISON:  Radiograph 06/07/2021, right shoulder CT 01/15/2021 FINDINGS: Cardiovascular: Mild cardiomegaly. No pericardial disease. Coronary artery calcifications. Normal size main and branch pulmonary arteries. There is Jalysa Swopes right upper lobe subsegmental pulmonary embolism and Arcenia Scarbro left upper lobe segmental and subsegmental pulmonary embolism (series 5, images 100 and 110-111). No central/saddle embolism. No evidence of right heart strain (RV: LV ratio is 0.8).  There is moderate atherosclerotic calcifications of the aortic arch and descending aorta. Mediastinum/Nodes: No lymphadenopathy.The thyroid is unremarkable. The esophagus is unremarkable. Lungs/Pleura: There is endobronchial material within the left mainstem and lower lobe bronchus, with near complete left lower lobe collapse and areas of decreased density within the collapsed left lower lobe consistent with pneumonia. Trace respiratory secretions in the trachea.Right basilar atelectasis. Lingular subsegmental atelectasis. Small left and trace right pleural effusions. No pneumothorax. Upper Abdomen: Calcified hepatic granulomas. Musculoskeletal: There are subacute left posterior ninth, tenth, and eleventh rib fractures near the costovertebral junction.Anterior superior right glenohumeral dislocation and fragmented fracture of the coracoid. Dislocation was present on prior CT in September 22, but the fracture of the coracoid is new since that exam. There is severe left and mild-to-moderate right [place] joint arthritis with slight posterior subluxation of right medial clavicle with respect to the manubrium. Review of the MIP images confirms the above findings. IMPRESSION: Right upper lobe subsegmental and left upper lobe segmental and subsegmental pulmonary emboli. No central/saddle embolism. No CT evidence of right heart strain. Endobronchial material within the left mainstem and left lower lobe bronchus, with near complete left lower lobe collapse and findings suggestive of left lower lobe aspiration pneumonia. Small left and trace right pleural effusions. Right basilar and lingular atelectasis. Subacute left posterior ninth, tenth, and eleventh rib fractures near the costovertebral junction. Anterior superior right glenohumeral dislocation with fragmented fracture of the coracoid. The right shoulder was dislocated on prior CT in September 22, but the coracoid fracture is new. Aortic Atherosclerosis (ICD10-I70.0).  Critical Value/emergent results were called by telephone at the time of interpretation on 06/07/2021 at 10:06 am to provider taking care of the patient, who verbally acknowledged these results. Electronically Signed   By: Maurine Simmering M.D.   On: 06/07/2021 10:21   DG Chest Portable 1 View  Result Date: 06/07/2021 CLINICAL DATA:  Hypoxia, shortness of breath. EXAM: PORTABLE CHEST 1 VIEW COMPARISON:  May 27, 2021. FINDINGS: The heart size and mediastinal contours are within normal limits. Hypoinflation of the lungs is noted with mild bibasilar subsegmental atelectasis. The visualized skeletal structures are unremarkable. IMPRESSION: Hypoinflation of the lungs with mild bibasilar subsegmental atelectasis. Electronically Signed   By: Marijo Conception M.D.   On: 06/07/2021 08:51   DG C-Arm 1-60 Min-No Report  Result Date: 06/03/2021 Fluoroscopy was utilized by the requesting physician.  No radiographic interpretation.  ECHOCARDIOGRAM COMPLETE  Result Date: 06/12/2021    ECHOCARDIOGRAM REPORT   Patient Name:   RIYAD KEENA Date of Exam: 06/12/2021 Medical Rec #:  [id number]   Height:       67.0 in Accession #:    [id number]  Weight:       195.1 lb Date of Birth:  31-Oct-1945   BSA:          2.001 m Patient Age:    44 years    BP:           106/48 mmHg Patient Gender: M           HR:           68 bpm. Exam Location:  Inpatient Procedure: 2D Echo Indications:    Pulmonary embolism  History:        Patient has prior history of Echocardiogram examinations, most                 recent 09/13/2019. Arrythmias:Atrial Fibrillation; Risk                 Factors:Hypertension.  Sonographer:    Beryle Beams Referring Phys: [phone] Maynard David CALDWELL POWELL JR IMPRESSIONS  1. Left ventricular ejection fraction, by estimation, is 60 to 65%. The left ventricle has normal function. The left ventricle has no regional wall motion abnormalities. Left ventricular diastolic parameters are indeterminate.  2. Right ventricular systolic function  is normal. The right ventricular size is normal. There is normal pulmonary artery systolic pressure.  3. The mitral valve is normal in structure. No evidence of mitral valve regurgitation. No evidence of mitral stenosis.  4. The aortic valve was not well visualized. There is mild calcification of the aortic valve. Aortic valve regurgitation is not visualized. No aortic stenosis is present.  5. The inferior vena cava is normal in size with greater than 50% respiratory variability, suggesting right atrial pressure of 3 mmHg. FINDINGS  Left Ventricle: Left ventricular ejection fraction, by estimation, is 60 to 65%. The left ventricle has normal function. The left ventricle has no regional wall motion abnormalities. The left ventricular internal cavity size was normal in size. There is  no left ventricular hypertrophy. Left ventricular diastolic parameters are indeterminate. Right Ventricle: The right ventricular size is normal. No increase in right ventricular wall thickness. Right ventricular systolic function is normal. There is normal pulmonary artery systolic pressure. The tricuspid regurgitant velocity is 2.38 m/s, and  with an assumed right atrial pressure of 5 mmHg, the estimated right ventricular systolic pressure is 69.6 mmHg. Left Atrium: Left atrial size was normal in size. Right Atrium: Right atrial size was normal in size. Pericardium: There is no evidence of pericardial effusion. Mitral Valve: The mitral valve is normal in structure. No evidence of mitral valve regurgitation. No evidence of mitral valve stenosis. Tricuspid Valve: The tricuspid valve is normal in structure. Tricuspid valve regurgitation is trivial. No evidence of tricuspid stenosis. Aortic Valve: The aortic valve was not well visualized. There is mild calcification of the aortic valve. Aortic valve regurgitation is not visualized. No aortic stenosis is present. Aortic valve peak gradient measures 5.2 mmHg. Pulmonic Valve: The pulmonic  valve was normal in structure. Pulmonic valve regurgitation is not visualized. No evidence of pulmonic stenosis. Aorta: The aortic root is normal in size and structure. Venous: The inferior vena cava is normal in size with greater than 50% respiratory variability, suggesting right atrial pressure of 3 mmHg. IAS/Shunts: No atrial level shunt detected by  color flow Doppler.  LEFT VENTRICLE PLAX 2D LVIDd:         4.10 cm LVIDs:         2.20 cm LV PW:         0.90 cm LV IVS:        1.00 cm LVOT diam:     2.00 cm LV SV:         64 LV SV Index:   32 LVOT Area:     3.14 cm  RIGHT VENTRICLE             IVC RV Basal diam:  3.60 cm     IVC diam: 1.90 cm RV S prime:     13.70 cm/s TAPSE (M-mode): 1.6 cm LEFT ATRIUM           Index        RIGHT ATRIUM           Index LA diam:      4.30 cm 2.15 cm/m   RA Area:     20.40 cm LA Vol (A2C): 61.1 ml 30.53 ml/m  RA Volume:   57.30 ml  28.63 ml/m LA Vol (A4C): 64.0 ml 31.98 ml/m  AORTIC VALVE                 PULMONIC VALVE AV Area (Vmax): 2.85 cm     PV Vmax:       1.25 m/s AV Vmax:        114.50 cm/s  PV Peak grad:  6.2 mmHg AV Peak Grad:   5.2 mmHg LVOT Vmax:      104.00 cm/s LVOT Vmean:     63.400 cm/s LVOT VTI:       0.205 m  AORTA Ao Root diam: 3.80 cm Ao Asc diam:  3.80 cm MITRAL VALVE                TRICUSPID VALVE MV Area (PHT): 4.17 cm     TR Peak grad:   22.7 mmHg MV Decel Time: 182 msec     TR Vmax:        238.00 cm/s MV E velocity: 122.00 cm/s                             SHUNTS                             Systemic VTI:  0.20 m                             Systemic Diam: 2.00 cm Candee Furbish MD Electronically signed by Candee Furbish MD Signature Date/Time: 06/12/2021/3:35:04 PM    Final    DG HIP UNILAT WITH PELVIS 1V LEFT  Result Date: 06/08/2021 CLINICAL DATA:  Total hip arthroplasty EXAM: DG HIP (WITH OR WITHOUT PELVIS) 1V*L* COMPARISON:  Radiograph 06/06/2021 FINDINGS: Unchanged alignment of the left hip arthroplasty without evidence of periprosthetic fracture.  Retained contrast material in the bladder from recent CT scan. IMPRESSION: Unchanged left hip arthroplasty alignment without evidence of complication. Electronically Signed   By: Maurine Simmering M.D.   On: 06/08/2021 09:08   DG HIP UNILAT WITH PELVIS 1V LEFT  Result Date: 06/06/2021 CLINICAL DATA:  History of left hip replacement on 06/03/2021 EXAM: DG HIP (WITH OR WITHOUT PELVIS) 1V*L* COMPARISON:  Intraoperative hip radiograph 06/03/2021  FINDINGS: Postsurgical changes reflecting left hip arthroplasty are again seen. Prosthesis is high riding within the acetabulum, similar to the intraoperative radiographs. There is no evidence of complication. Mild degenerative change about the right hip is noted. The SI joints and symphysis pubis are intact. IMPRESSION: Postsurgical changes reflecting left hip arthroplasty without evidence of complication. Electronically Signed   By: Valetta Mole M.D.   On: 06/06/2021 09:17   DG HIP OPERATIVE UNILAT WITH PELVIS LEFT  Result Date: 06/03/2021 CLINICAL DATA:  Fluoroscopy for anterior left hip replacement. EXAM: OPERATIVE  HIP (WITH PELVIS IF PERFORMED)  VIEWS TECHNIQUE: Fluoroscopic spot image(s) were submitted for interpretation post-operatively. COMPARISON:  Left hip radiographs 05/02/2021 FINDINGS: Images were performed intraoperatively without the presence of Khriz Liddy radiologist. The patient appears to be undergoing total left hip arthroplasty. Brookes Craine single cerclage wire overlies the intertrochanteric region of the left femur. Please see intraoperative findings for further detail. IMPRESSION: Fluoroscopy for left hip arthroplasty. Electronically Signed   By: Yvonne Kendall M.D.   On: 06/03/2021 10:35   VAS [place] LOWER EXTREMITY VENOUS (DVT) (ONLY [facility] & [facility])  Result Date: 06/07/2021  Lower Venous DVT Study Patient Name:  ESTON HESLIN  Date of Exam:   06/07/2021 Medical Rec #: [id number]    Accession #:    [id number] Date of Birth: April 17, 1946    Patient Gender: M Patient Age:   39 years Exam  Location:  [facility] Procedure:      VAS [place] LOWER EXTREMITY VENOUS (DVT) Referring Phys: Marda Stalker --------------------------------------------------------------------------------  Indications: Pain.  Risk Factors: None identified. Limitations: Poor ultrasound/tissue interface. Comparison Study: No prior studies. Performing Technologist: Oliver Hum RVT  Examination Guidelines: Ayslin Kundert complete evaluation includes B-mode imaging, spectral Doppler, color Doppler, and power Doppler as needed of all accessible portions of each vessel. Bilateral testing is considered an integral part of Weslee Prestage complete examination. Limited examinations for reoccurring indications may be performed as noted. The reflux portion of the exam is performed with the patient in reverse Trendelenburg.  +---------+---------------+---------+-----------+----------+--------------+  RIGHT     Compressibility Phasicity Spontaneity Properties Thrombus Aging  +---------+---------------+---------+-----------+----------+--------------+  CFV       Full            Yes       Yes                                    +---------+---------------+---------+-----------+----------+--------------+  SFJ       Full                                                             +---------+---------------+---------+-----------+----------+--------------+  FV Prox   Full                                                             +---------+---------------+---------+-----------+----------+--------------+  FV Mid    Full                                                             +---------+---------------+---------+-----------+----------+--------------+  FV Distal Full                                                             +---------+---------------+---------+-----------+----------+--------------+  PFV       Full                                                             +---------+---------------+---------+-----------+----------+--------------+  POP        Full            Yes       Yes                                    +---------+---------------+---------+-----------+----------+--------------+  PTV       Full                                                             +---------+---------------+---------+-----------+----------+--------------+  PERO      Full                                                             +---------+---------------+---------+-----------+----------+--------------+   +---------+---------------+---------+-----------+----------+--------------+  LEFT      Compressibility Phasicity Spontaneity Properties Thrombus Aging  +---------+---------------+---------+-----------+----------+--------------+  CFV       Full            Yes       Yes                                    +---------+---------------+---------+-----------+----------+--------------+  SFJ       Full                                                             +---------+---------------+---------+-----------+----------+--------------+  FV Prox   Full                                                             +---------+---------------+---------+-----------+----------+--------------+  FV Mid    Full                                                             +---------+---------------+---------+-----------+----------+--------------+  FV Distal Full                                                             +---------+---------------+---------+-----------+----------+--------------+  PFV       Full                                                             +---------+---------------+---------+-----------+----------+--------------+  POP       Full            Yes       Yes                                    +---------+---------------+---------+-----------+----------+--------------+  PTV       Full                                                             +---------+---------------+---------+-----------+----------+--------------+  PERO      Full                                                              +---------+---------------+---------+-----------+----------+--------------+    Summary: RIGHT: - There is no evidence of deep vein thrombosis in the lower extremity.  - No cystic structure found in the popliteal fossa.  LEFT: - There is no evidence of deep vein thrombosis in the lower extremity.  - No cystic structure found in the popliteal fossa.  *See table(s) above for measurements and observations.    Preliminary     Microbiology: Recent Results (from the past 240 hour(s))  Resp Panel by RT-PCR (Flu Roizy Harold&B, Covid) Nasopharyngeal Swab     Status: None   Collection Time: 06/05/21 12:49 PM   Specimen: Nasopharyngeal Swab; Nasopharyngeal(NP) swabs in vial transport medium  Result Value Ref Range Status   SARS Coronavirus 2 by RT PCR NEGATIVE NEGATIVE Final    Comment: (NOTE) SARS-CoV-2 target nucleic acids are NOT DETECTED.  The SARS-CoV-2 RNA is generally detectable in upper respiratory specimens during the acute phase of infection. The lowest concentration of SARS-CoV-2 viral copies this assay can detect is 138 copies/mL. Vaniyah Lansky negative result does not preclude SARS-Cov-2 infection and should not be used as the sole basis for treatment or other patient management decisions. Vonna Brabson negative result may occur with  improper specimen collection/handling, submission of specimen other than nasopharyngeal swab, presence of viral mutation(s) within the areas targeted by this assay, and inadequate number of viral copies(<138 copies/mL). Noami Bove negative result must be combined with clinical observations, patient history, and epidemiological information. The expected result is Negative.  Fact Sheet for Patients:  EntrepreneurPulse.com.au  Fact Sheet for Healthcare Providers:  IncredibleEmployment.be  This test is no t yet approved or cleared by the [place] and  has been authorized for detection and/or diagnosis of SARS-CoV-2 by FDA under an  Emergency Use Authorization (EUA). This EUA will remain  in effect (meaning this test can be used) for the duration of the COVID-19 declaration under Section 564(b)(1) of the Act, 21 U.S.C.section 360bbb-3(b)(1), unless the authorization is terminated  or revoked sooner.       Influenza Nabila Albarracin by PCR NEGATIVE NEGATIVE Final   Influenza B by PCR NEGATIVE NEGATIVE Final    Comment: (NOTE) The Xpert Xpress SARS-CoV-2/FLU/RSV plus assay is intended as an aid in the diagnosis of influenza from Nasopharyngeal swab specimens and should not be used as Karren Newland sole basis for treatment. Nasal washings and aspirates are unacceptable for Xpert Xpress SARS-CoV-2/FLU/RSV testing.  Fact Sheet for Patients: EntrepreneurPulse.com.au  Fact Sheet for Healthcare Providers: IncredibleEmployment.be  This test is not yet approved or cleared by the [place] FDA and has been authorized for detection and/or diagnosis of SARS-CoV-2 by FDA under an Emergency Use Authorization (EUA). This EUA will remain in effect (meaning this test can be used) for the duration of the COVID-19 declaration under Section 564(b)(1) of the Act, 21 U.S.C. section 360bbb-3(b)(1), unless the authorization is terminated or revoked.  Performed at [facility], [place] [address] [place], [place] [address]   Resp Panel by RT-PCR (Flu Anyela Napierkowski&B, Covid) Nasopharyngeal Swab     Status: None   Collection Time: 06/07/21  8:24 AM   Specimen: Nasopharyngeal Swab; Nasopharyngeal(NP) swabs in vial transport medium  Result Value Ref Range Status   SARS Coronavirus 2 by RT PCR NEGATIVE NEGATIVE Final    Comment: (NOTE) SARS-CoV-2 target nucleic acids are NOT DETECTED.  The SARS-CoV-2 RNA is generally detectable in upper respiratory specimens during the acute phase of infection. The lowest concentration of SARS-CoV-2 viral copies this assay can detect is 138 copies/mL. Corneluis Allston negative result does not  preclude SARS-Cov-2 infection and should not be used as the sole basis for treatment or other patient management decisions. Demere Dotzler negative result may occur with  improper specimen collection/handling, submission of specimen other than nasopharyngeal swab, presence of viral mutation(s) within the areas targeted by this assay, and inadequate number of viral copies(<138 copies/mL). Chaye Misch negative result must be combined with clinical observations, patient history, and epidemiological information. The expected result is Negative.  Fact Sheet for Patients:  EntrepreneurPulse.com.au  Fact Sheet for Healthcare Providers:  IncredibleEmployment.be  This test is no t yet approved or cleared by the [place] FDA and  has been authorized for detection and/or diagnosis of SARS-CoV-2 by FDA under an Emergency Use Authorization (EUA). This EUA will remain  in effect (meaning this test can be used) for the duration of the COVID-19 declaration under Section 564(b)(1) of the Act, 21 U.S.C.section 360bbb-3(b)(1), unless the authorization is terminated  or revoked sooner.       Influenza Serenidy Waltz by PCR NEGATIVE NEGATIVE Final   Influenza B by PCR NEGATIVE NEGATIVE Final    Comment: (NOTE) The Xpert Xpress SARS-CoV-2/FLU/RSV plus assay is intended as an aid in the diagnosis of influenza from Nasopharyngeal swab specimens and should not be used as Jahmeer Porche sole basis for treatment. Nasal washings and aspirates are unacceptable for Xpert Xpress SARS-CoV-2/FLU/RSV testing.  Fact Sheet for Patients: EntrepreneurPulse.com.au  Fact Sheet for Healthcare Providers: IncredibleEmployment.be  This test is not yet approved or cleared by the [place] and has been authorized  for detection and/or diagnosis of SARS-CoV-2 by FDA under an Emergency Use Authorization (EUA). This EUA will remain in effect (meaning this test can be used) for the  duration of the COVID-19 declaration under Section 564(b)(1) of the Act, 21 U.S.C. section 360bbb-3(b)(1), unless the authorization is terminated or revoked.  Performed at [facility], [place] [address], [place], [place] [address]   Expectorated Sputum Assessment w Gram Stain, Rflx to Resp Cult     Status: None   Collection Time: 06/07/21  5:42 PM   Specimen: Expectorated Sputum  Result Value Ref Range Status   Specimen Description EXPECTORATED SPUTUM  Final   Special Requests NONE  Final   Sputum evaluation   Final    THIS SPECIMEN IS ACCEPTABLE FOR SPUTUM CULTURE Performed at [facility], [place] [address], [place], [place] [address]    Report Status 06/07/2021 FINAL  Final  Culture, Respiratory w Gram Stain     Status: None   Collection Time: 06/07/21  5:42 PM  Result Value Ref Range Status   Specimen Description   Final    EXPECTORATED SPUTUM Performed at [place] [address] [place], [place] [address]    Special Requests   Final    NONE Reflexed from [phone] Performed at [facility], [place] [address], [place], [place] [address]    Gram Stain   Final    FEW WBC PRESENT,BOTH PMN AND MONONUCLEAR RARE GRAM POSITIVE COCCI RARE GRAM NEGATIVE RODS    Culture   Final    RARE Normal respiratory flora-no Staph aureus or Pseudomonas seen Performed at [facility], [address], [place], [place] [address]    Report Status 06/09/2021 FINAL  Final     Labs: Basic Metabolic Panel: Recent Labs  Lab 06/07/21 0824 06/13/21 0546  NA 138 135  K 3.8 3.6  CL 100 105  CO2 33* 27  GLUCOSE 93 87  BUN 17 19  CREATININE 0.62 0.72  CALCIUM 8.8* 8.2*  MG  --  2.2  PHOS  --  3.7   Liver Function Tests: Recent Labs  Lab 06/07/21 0824  AST 22  ALT 19  ALKPHOS 55  BILITOT 1.3*  PROT 5.8*  ALBUMIN 2.8*   Recent Labs  Lab 06/07/21 0824  LIPASE 24   No results for input(s): AMMONIA in  the last 168 hours. CBC: Recent Labs  Lab 06/07/21 0824 06/08/21 0320 06/13/21 0546  WBC 11.3* 10.4 11.4*  NEUTROABS 8.3*  --   --   HGB 9.7* 8.8* 8.8*  HCT 30.8* 27.7* 27.5*  MCV 103.4* 100.7* 103.8*  PLT 281 292 330   Cardiac Enzymes: No results for input(s): CKTOTAL, CKMB, CKMBINDEX, TROPONINI in the last 168 hours. BNP: BNP (last 3 results) Recent Labs    11/05/20 0137 01/29/21 0512 06/07/21 0824  BNP 111.3* 88.1 227.0*    ProBNP (last 3 results) No results for input(s): PROBNP in the last 8760 hours.  CBG: No results for input(s): GLUCAP in the last 168 hours.     Signed:  Fayrene Helper MD.  [facility] 06/13/2021, 1:44 PM

## 2021-06-13 NOTE — Assessment & Plan Note (Signed)
No interventions per ortho, no restrictions Follow outpatient

## 2021-06-13 NOTE — Progress Notes (Signed)
Occupational Therapy Treatment Patient Details Name: Logan [name] MRN: [id number] DOB: 08/18/1945 Today's Date: 06/13/2021   History of present illness Jhoel Stieg is a 76 y.o. male with a history of gout, atrial fibrillation, prediabetes, hypertension, DVT, hyperlipidemia. Patient on Eliquis prior to admission. Patient presented secondary to shortness of breath and was found to have an acute bilateral PE in addition to evidence of aspiration pneumonia   OT comments  Patient incontinent of stool in bed stating "I think its gas then it's not." Patient needing mod A for trunk support to sit upright to edge of bed. With bed height elevated and three attempts, patient mod +1 to power up to standing. Once balanced patient able to take small pivot steps to bedside commode with mod A. Patient needing total A for perianal care due to heavily reliance on UE support for static standing. Recliner chair brought behind patient once finished on BSC. Patient showing improvements with mobility however still needing mod to max A for transfers, and LB ADLs.    Recommendations for follow up therapy are one component of a multi-disciplinary discharge planning process, led by the attending physician.  Recommendations may be updated based on patient status, additional functional criteria and insurance authorization.    Follow Up Recommendations  Skilled nursing-short term rehab (<3 hours/day)    Assistance Recommended at Discharge Frequent or constant Supervision/Assistance  Patient can return home with the following  A lot of help with walking and/or transfers;A lot of help with bathing/dressing/bathroom;Assistance with cooking/housework;Assist for transportation   Equipment Recommendations  None recommended by OT       Precautions / Restrictions Precautions Precautions: Fall Precaution Comments: monitor  sats, New PE, "bad" R shoulder and "bad" R knee Restrictions Weight Bearing Restrictions: No        Mobility Bed Mobility Overal bed mobility: Needs Assistance Bed Mobility: Supine to Sit     Supine to sit: Mod assist, HOB elevated     General bed mobility comments: Patient able to bring legs to edge of bed, mod A for trunk support upright       Balance Overall balance assessment: Needs assistance Sitting-balance support: Feet supported Sitting balance-Leahy Scale: Fair     Standing balance support: Bilateral upper extremity supported, During functional activity, Reliant on assistive device for balance Standing balance-Leahy Scale: Poor                             ADL either performed or assessed with clinical judgement   ADL Overall ADL's : Needs assistance/impaired         Upper Body Bathing: Minimal assistance;Sitting Upper Body Bathing Details (indicate cue type and reason): Patient able to wash upper body while seated in chair except for L upper arm         Lower Body Dressing: Total assistance;Sitting/lateral leans Lower Body Dressing Details (indicate cue type and reason): To don socks Toilet Transfer: Moderate assistance;Stand-pivot;Cueing for safety;Cueing for sequencing;BSC/3in1;Rolling walker (2 wheels) Toilet Transfer Details (indicate cue type and reason): Patient needing mod A +1 to power up to standing from edge of bed and min/mod A +1 to power up to standing from bedside commode. Recliner chair pulled up behind patient after use of bedside commode Toileting- Clothing Manipulation and Hygiene: Total assistance;Sit to/from stand Toileting - Clothing Manipulation Details (indicate cue type and reason): For perianal care after incontinent of bowel movement, heavily reliant on UE support of walker for static standing balance  Functional mobility during ADLs: Moderate assistance;Cueing for safety;Cueing for sequencing;Rolling walker (2 wheels)        Cognition Arousal/Alertness: Awake/alert Behavior During Therapy: WFL for tasks  assessed/performed Overall Cognitive Status: Within Functional Limits for tasks assessed                                                     Pertinent Vitals/ Pain       Pain Assessment Pain Assessment: Faces Faces Pain Scale: Hurts little more Pain Location: R shoulder Pain Descriptors / Indicators: Sore, Discomfort Pain Intervention(s): Monitored during session         Frequency  Min 2X/week        Progress Toward Goals  OT Goals(current goals can now be found in the care plan section)  Progress towards OT goals: Progressing toward goals  Acute Rehab OT Goals Patient Stated Goal: Move better OT Goal Formulation: With patient Time For Goal Achievement: 06/24/21 Potential to Achieve Goals: Good ADL Goals Pt Will Perform Upper Body Bathing: with supervision;sitting Pt Will Perform Lower Body Dressing: with min assist;sit to/from stand;sitting/lateral leans Pt Will Transfer to Toilet: with min assist;ambulating;regular height toilet Pt Will Perform Toileting - Clothing Manipulation and hygiene: with min assist;sitting/lateral leans;sit to/from stand  Plan Discharge plan remains appropriate       AM-PAC OT "6 Clicks" Daily Activity     Outcome Measure   Help from another person eating meals?: A Little Help from another person taking care of personal grooming?: A Little Help from another person toileting, which includes using toliet, bedpan, or urinal?: A Lot Help from another person bathing (including washing, rinsing, drying)?: A Lot Help from another person to put on and taking off regular upper body clothing?: A Lot Help from another person to put on and taking off regular lower body clothing?: Total 6 Click Score: 13    End of Session Equipment Utilized During Treatment: Rolling walker (2 wheels)  OT Visit Diagnosis: Unsteadiness on feet (R26.81);Other abnormalities of gait and mobility (R26.89);Muscle weakness (generalized)  (M62.81);History of falling (Z91.81);Pain Pain - part of body: Shoulder;Arm   Activity Tolerance Patient tolerated treatment well   Patient Left in chair;with call bell/phone within reach;with chair alarm set   Nurse Communication Mobility status        Time: 6440-3474 OT Time Calculation (min): 28 min  Charges: OT General Charges $OT Visit: 1 Visit OT Treatments $Self Care/Home Management : 23-37 mins  Delbert Phenix OT OT pager: [phone]   Rosemary Holms 06/13/2021, 11:25 AM

## 2021-06-13 NOTE — TOC Transition Note (Signed)
Transition of Care [facility]) - CM/SW Discharge Note   Patient Details  Name: Logan [name] MRN: [id number] Date of Birth: December 18, 1945  Transition of Care [facility]) CM/SW Contact:  Lynnell Catalan, RN Phone Number: 06/13/2021, 2:32 PM   Clinical Narrative:    Pt to dc to [facility] today via [facility]. He will go to room 106 and RN to call report to [phone]. Son Edmund called to inform.       Readmission Risk Interventions Readmission Risk Prevention Plan 01/03/2021  Post Dischage Appt Not Complete  Appt Comments going to INPT rehab  Medication Screening Complete  Transportation Screening Complete  Some recent data might be hidden

## 2021-06-16 DIAGNOSIS — D6869 Other thrombophilia: Secondary | ICD-10-CM | POA: Diagnosis not present

## 2021-06-16 DIAGNOSIS — J690 Pneumonitis due to inhalation of food and vomit: Secondary | ICD-10-CM | POA: Diagnosis not present

## 2021-06-16 DIAGNOSIS — I2699 Other pulmonary embolism without acute cor pulmonale: Secondary | ICD-10-CM | POA: Diagnosis not present

## 2021-06-16 DIAGNOSIS — I4891 Unspecified atrial fibrillation: Secondary | ICD-10-CM | POA: Diagnosis not present

## 2021-06-17 ENCOUNTER — Ambulatory Visit: Payer: Medicare HMO | Admitting: Cardiology

## 2021-06-20 DIAGNOSIS — M1612 Unilateral primary osteoarthritis, left hip: Secondary | ICD-10-CM | POA: Diagnosis not present

## 2021-06-20 DIAGNOSIS — Z9889 Other specified postprocedural states: Secondary | ICD-10-CM | POA: Diagnosis not present

## 2021-07-09 ENCOUNTER — Ambulatory Visit: Payer: Medicare HMO | Admitting: Cardiology

## 2021-07-18 DIAGNOSIS — U071 COVID-19: Secondary | ICD-10-CM | POA: Diagnosis not present

## 2021-07-18 DIAGNOSIS — J1282 Pneumonia due to coronavirus disease 2019: Secondary | ICD-10-CM | POA: Diagnosis not present

## 2021-07-24 DIAGNOSIS — Z79899 Other long term (current) drug therapy: Secondary | ICD-10-CM | POA: Diagnosis not present

## 2021-07-24 DIAGNOSIS — I5020 Unspecified systolic (congestive) heart failure: Secondary | ICD-10-CM | POA: Diagnosis not present

## 2021-07-24 DIAGNOSIS — I10 Essential (primary) hypertension: Secondary | ICD-10-CM | POA: Diagnosis not present

## 2021-07-27 DIAGNOSIS — G8252 Quadriplegia, C1-C4 incomplete: Secondary | ICD-10-CM | POA: Diagnosis not present

## 2021-07-27 DIAGNOSIS — M109 Gout, unspecified: Secondary | ICD-10-CM | POA: Diagnosis not present

## 2021-07-27 DIAGNOSIS — S42131D Displaced fracture of coracoid process, right shoulder, subsequent encounter for fracture with routine healing: Secondary | ICD-10-CM | POA: Diagnosis not present

## 2021-07-27 DIAGNOSIS — Z471 Aftercare following joint replacement surgery: Secondary | ICD-10-CM | POA: Diagnosis not present

## 2021-07-27 DIAGNOSIS — J690 Pneumonitis due to inhalation of food and vomit: Secondary | ICD-10-CM | POA: Diagnosis not present

## 2021-07-27 DIAGNOSIS — M5000 Cervical disc disorder with myelopathy, unspecified cervical region: Secondary | ICD-10-CM | POA: Diagnosis not present

## 2021-07-27 DIAGNOSIS — S2231XD Fracture of one rib, right side, subsequent encounter for fracture with routine healing: Secondary | ICD-10-CM | POA: Diagnosis not present

## 2021-07-27 DIAGNOSIS — I2699 Other pulmonary embolism without acute cor pulmonale: Secondary | ICD-10-CM | POA: Diagnosis not present

## 2021-08-05 DIAGNOSIS — A0472 Enterocolitis due to Clostridium difficile, not specified as recurrent: Secondary | ICD-10-CM | POA: Diagnosis not present

## 2021-08-05 DIAGNOSIS — Z6828 Body mass index (BMI) 28.0-28.9, adult: Secondary | ICD-10-CM | POA: Diagnosis not present

## 2021-08-05 DIAGNOSIS — Z79899 Other long term (current) drug therapy: Secondary | ICD-10-CM | POA: Diagnosis not present

## 2021-08-05 DIAGNOSIS — Z09 Encounter for follow-up examination after completed treatment for conditions other than malignant neoplasm: Secondary | ICD-10-CM | POA: Diagnosis not present

## 2021-08-23 ENCOUNTER — Telehealth: Payer: Self-pay | Admitting: Cardiology

## 2021-08-23 ENCOUNTER — Other Ambulatory Visit: Payer: Self-pay

## 2021-08-23 DIAGNOSIS — J9811 Atelectasis: Secondary | ICD-10-CM | POA: Diagnosis not present

## 2021-08-23 DIAGNOSIS — R5383 Other fatigue: Secondary | ICD-10-CM | POA: Diagnosis not present

## 2021-08-23 DIAGNOSIS — E876 Hypokalemia: Secondary | ICD-10-CM | POA: Diagnosis not present

## 2021-08-23 DIAGNOSIS — A419 Sepsis, unspecified organism: Secondary | ICD-10-CM | POA: Diagnosis not present

## 2021-08-23 DIAGNOSIS — R509 Fever, unspecified: Secondary | ICD-10-CM | POA: Diagnosis not present

## 2021-08-23 DIAGNOSIS — E860 Dehydration: Secondary | ICD-10-CM | POA: Diagnosis not present

## 2021-08-23 DIAGNOSIS — M109 Gout, unspecified: Secondary | ICD-10-CM | POA: Diagnosis not present

## 2021-08-23 DIAGNOSIS — I4891 Unspecified atrial fibrillation: Secondary | ICD-10-CM | POA: Diagnosis not present

## 2021-08-23 DIAGNOSIS — K219 Gastro-esophageal reflux disease without esophagitis: Secondary | ICD-10-CM | POA: Diagnosis not present

## 2021-08-23 DIAGNOSIS — R5381 Other malaise: Secondary | ICD-10-CM | POA: Diagnosis not present

## 2021-08-23 DIAGNOSIS — R112 Nausea with vomiting, unspecified: Secondary | ICD-10-CM | POA: Diagnosis not present

## 2021-08-23 DIAGNOSIS — R0902 Hypoxemia: Secondary | ICD-10-CM | POA: Diagnosis not present

## 2021-08-23 DIAGNOSIS — I7121 Aneurysm of the ascending aorta, without rupture: Secondary | ICD-10-CM | POA: Diagnosis not present

## 2021-08-23 DIAGNOSIS — I5033 Acute on chronic diastolic (congestive) heart failure: Secondary | ICD-10-CM | POA: Diagnosis not present

## 2021-08-23 DIAGNOSIS — I110 Hypertensive heart disease with heart failure: Secondary | ICD-10-CM | POA: Diagnosis not present

## 2021-08-23 DIAGNOSIS — N400 Enlarged prostate without lower urinary tract symptoms: Secondary | ICD-10-CM | POA: Diagnosis not present

## 2021-08-23 DIAGNOSIS — R531 Weakness: Secondary | ICD-10-CM | POA: Diagnosis not present

## 2021-08-23 DIAGNOSIS — R197 Diarrhea, unspecified: Secondary | ICD-10-CM | POA: Diagnosis not present

## 2021-08-23 DIAGNOSIS — A0472 Enterocolitis due to Clostridium difficile, not specified as recurrent: Secondary | ICD-10-CM | POA: Diagnosis not present

## 2021-08-23 NOTE — Telephone Encounter (Signed)
New Message: ? ? ? ?Please call asap, need to go over patient's medication. Patient is in [facility]. ?

## 2021-08-23 NOTE — Telephone Encounter (Signed)
Called Dee the pharmacist at [facility] and reviewed the patients medications with her. She had no further questions after reviewing the patient's medication list with me. ?

## 2021-09-02 DIAGNOSIS — I482 Chronic atrial fibrillation, unspecified: Secondary | ICD-10-CM | POA: Diagnosis not present

## 2021-09-02 DIAGNOSIS — I110 Hypertensive heart disease with heart failure: Secondary | ICD-10-CM | POA: Diagnosis not present

## 2021-09-02 DIAGNOSIS — J9811 Atelectasis: Secondary | ICD-10-CM | POA: Diagnosis not present

## 2021-09-02 DIAGNOSIS — N281 Cyst of kidney, acquired: Secondary | ICD-10-CM | POA: Diagnosis not present

## 2021-09-02 DIAGNOSIS — M1990 Unspecified osteoarthritis, unspecified site: Secondary | ICD-10-CM | POA: Diagnosis not present

## 2021-09-02 DIAGNOSIS — R1084 Generalized abdominal pain: Secondary | ICD-10-CM | POA: Diagnosis not present

## 2021-09-02 DIAGNOSIS — I700 Atherosclerosis of aorta: Secondary | ICD-10-CM | POA: Diagnosis not present

## 2021-09-02 DIAGNOSIS — D72829 Elevated white blood cell count, unspecified: Secondary | ICD-10-CM | POA: Diagnosis not present

## 2021-09-02 DIAGNOSIS — R531 Weakness: Secondary | ICD-10-CM | POA: Diagnosis not present

## 2021-09-02 DIAGNOSIS — I714 Abdominal aortic aneurysm, without rupture, unspecified: Secondary | ICD-10-CM | POA: Diagnosis not present

## 2021-09-02 DIAGNOSIS — A0472 Enterocolitis due to Clostridium difficile, not specified as recurrent: Secondary | ICD-10-CM | POA: Diagnosis not present

## 2021-09-02 DIAGNOSIS — R197 Diarrhea, unspecified: Secondary | ICD-10-CM | POA: Diagnosis not present

## 2021-09-02 DIAGNOSIS — R110 Nausea: Secondary | ICD-10-CM | POA: Diagnosis not present

## 2021-09-02 DIAGNOSIS — M4802 Spinal stenosis, cervical region: Secondary | ICD-10-CM | POA: Diagnosis not present

## 2021-09-02 DIAGNOSIS — I5032 Chronic diastolic (congestive) heart failure: Secondary | ICD-10-CM | POA: Diagnosis not present

## 2021-09-03 DIAGNOSIS — R531 Weakness: Secondary | ICD-10-CM | POA: Diagnosis not present

## 2021-09-03 DIAGNOSIS — I714 Abdominal aortic aneurysm, without rupture, unspecified: Secondary | ICD-10-CM | POA: Diagnosis not present

## 2021-09-03 DIAGNOSIS — M542 Cervicalgia: Secondary | ICD-10-CM | POA: Diagnosis not present

## 2021-09-03 DIAGNOSIS — D72829 Elevated white blood cell count, unspecified: Secondary | ICD-10-CM | POA: Diagnosis not present

## 2021-09-03 DIAGNOSIS — M4802 Spinal stenosis, cervical region: Secondary | ICD-10-CM | POA: Diagnosis not present

## 2021-09-03 DIAGNOSIS — N281 Cyst of kidney, acquired: Secondary | ICD-10-CM | POA: Diagnosis not present

## 2021-09-03 DIAGNOSIS — I5032 Chronic diastolic (congestive) heart failure: Secondary | ICD-10-CM | POA: Diagnosis not present

## 2021-09-03 DIAGNOSIS — I482 Chronic atrial fibrillation, unspecified: Secondary | ICD-10-CM | POA: Diagnosis not present

## 2021-09-03 DIAGNOSIS — J9811 Atelectasis: Secondary | ICD-10-CM | POA: Diagnosis not present

## 2021-09-03 DIAGNOSIS — I110 Hypertensive heart disease with heart failure: Secondary | ICD-10-CM | POA: Diagnosis not present

## 2021-09-03 DIAGNOSIS — I700 Atherosclerosis of aorta: Secondary | ICD-10-CM | POA: Diagnosis not present

## 2021-09-03 DIAGNOSIS — A0472 Enterocolitis due to Clostridium difficile, not specified as recurrent: Secondary | ICD-10-CM | POA: Diagnosis not present

## 2021-09-03 DIAGNOSIS — M1990 Unspecified osteoarthritis, unspecified site: Secondary | ICD-10-CM | POA: Diagnosis not present

## 2021-09-06 NOTE — Patient Outreach (Signed)
[facility]) Care Management  09/06/2021  Logan [name] Sep 29, 1945 [id number]  [employer] member referral from [facility] for transition of care needs. Request assigned to Enzo Montgomery, RN for follow up.  Thank you,  Ina Homes [facility] Management Assistant [phone]

## 2021-09-09 ENCOUNTER — Other Ambulatory Visit: Payer: Self-pay

## 2021-09-09 NOTE — Patient Outreach (Signed)
[facility]) Care Management  09/09/2021  Logan [name] 03-24-1946 [id number]     Transition of Care Referral  Referral Date: 09/06/2021  Referral Source: Discharge Report Date of Discharge: 09/05/2021 Facility: [facility]    Outreach attempt # 1 to patient. Spoke with patient. Patient with very difficult speech and hard to complete assessment. He is temporarily staying with son until he recovers. [place] services in place. He was able to state that he is doing fairly well-still having some diarrhea from c-diff diagnosis but down to only one loose stool/day. He has PCP appt next week. Denies any issues with transportation or meds. Patient declined further follow up and ongoing calls.     Plan: RN CM will close referral.   Enzo Montgomery, RN,BSN,CCM [place] Management Telephonic Care Management Coordinator Direct Phone: [phone] Toll Free: [phone] Fax: [phone]

## 2021-09-12 ENCOUNTER — Other Ambulatory Visit: Payer: Self-pay

## 2021-09-12 ENCOUNTER — Emergency Department (HOSPITAL_COMMUNITY): Payer: Medicare HMO

## 2021-09-12 ENCOUNTER — Encounter (HOSPITAL_COMMUNITY): Payer: Self-pay | Admitting: Emergency Medicine

## 2021-09-12 ENCOUNTER — Inpatient Hospital Stay (HOSPITAL_COMMUNITY)
Admission: EM | Admit: 2021-09-12 | Discharge: 2021-09-20 | DRG: 189 | Disposition: A | Discharge status: Skilled Nursing Facility | Payer: Medicare HMO | Attending: Family Medicine | Admitting: Family Medicine

## 2021-09-12 DIAGNOSIS — R0602 Shortness of breath: Secondary | ICD-10-CM | POA: Diagnosis not present

## 2021-09-12 DIAGNOSIS — Z8619 Personal history of other infectious and parasitic diseases: Secondary | ICD-10-CM | POA: Diagnosis not present

## 2021-09-12 DIAGNOSIS — K644 Residual hemorrhoidal skin tags: Secondary | ICD-10-CM | POA: Diagnosis present

## 2021-09-12 DIAGNOSIS — I110 Hypertensive heart disease with heart failure: Secondary | ICD-10-CM | POA: Diagnosis present

## 2021-09-12 DIAGNOSIS — I4891 Unspecified atrial fibrillation: Secondary | ICD-10-CM | POA: Diagnosis present

## 2021-09-12 DIAGNOSIS — R195 Other fecal abnormalities: Secondary | ICD-10-CM

## 2021-09-12 DIAGNOSIS — L89151 Pressure ulcer of sacral region, stage 1: Secondary | ICD-10-CM | POA: Diagnosis present

## 2021-09-12 DIAGNOSIS — E876 Hypokalemia: Secondary | ICD-10-CM | POA: Diagnosis present

## 2021-09-12 DIAGNOSIS — R279 Unspecified lack of coordination: Secondary | ICD-10-CM | POA: Diagnosis not present

## 2021-09-12 DIAGNOSIS — I2699 Other pulmonary embolism without acute cor pulmonale: Secondary | ICD-10-CM | POA: Diagnosis not present

## 2021-09-12 DIAGNOSIS — K625 Hemorrhage of anus and rectum: Secondary | ICD-10-CM

## 2021-09-12 DIAGNOSIS — R6521 Severe sepsis with septic shock: Secondary | ICD-10-CM | POA: Diagnosis not present

## 2021-09-12 DIAGNOSIS — K921 Melena: Secondary | ICD-10-CM | POA: Diagnosis not present

## 2021-09-12 DIAGNOSIS — J690 Pneumonitis due to inhalation of food and vomit: Secondary | ICD-10-CM | POA: Diagnosis not present

## 2021-09-12 DIAGNOSIS — I480 Paroxysmal atrial fibrillation: Secondary | ICD-10-CM | POA: Diagnosis not present

## 2021-09-12 DIAGNOSIS — I959 Hypotension, unspecified: Secondary | ICD-10-CM | POA: Diagnosis present

## 2021-09-12 DIAGNOSIS — R0689 Other abnormalities of breathing: Secondary | ICD-10-CM | POA: Diagnosis not present

## 2021-09-12 DIAGNOSIS — I5033 Acute on chronic diastolic (congestive) heart failure: Secondary | ICD-10-CM | POA: Diagnosis not present

## 2021-09-12 DIAGNOSIS — I5032 Chronic diastolic (congestive) heart failure: Secondary | ICD-10-CM | POA: Diagnosis present

## 2021-09-12 DIAGNOSIS — A419 Sepsis, unspecified organism: Secondary | ICD-10-CM

## 2021-09-12 DIAGNOSIS — Z86711 Personal history of pulmonary embolism: Secondary | ICD-10-CM | POA: Diagnosis not present

## 2021-09-12 DIAGNOSIS — R54 Age-related physical debility: Secondary | ICD-10-CM | POA: Diagnosis present

## 2021-09-12 DIAGNOSIS — R059 Cough, unspecified: Secondary | ICD-10-CM | POA: Diagnosis not present

## 2021-09-12 DIAGNOSIS — E782 Mixed hyperlipidemia: Secondary | ICD-10-CM | POA: Diagnosis present

## 2021-09-12 DIAGNOSIS — E785 Hyperlipidemia, unspecified: Secondary | ICD-10-CM | POA: Diagnosis not present

## 2021-09-12 DIAGNOSIS — E119 Type 2 diabetes mellitus without complications: Secondary | ICD-10-CM | POA: Diagnosis present

## 2021-09-12 DIAGNOSIS — G894 Chronic pain syndrome: Secondary | ICD-10-CM | POA: Diagnosis present

## 2021-09-12 DIAGNOSIS — D638 Anemia in other chronic diseases classified elsewhere: Secondary | ICD-10-CM | POA: Diagnosis present

## 2021-09-12 DIAGNOSIS — Z96642 Presence of left artificial hip joint: Secondary | ICD-10-CM | POA: Diagnosis present

## 2021-09-12 DIAGNOSIS — D6859 Other primary thrombophilia: Secondary | ICD-10-CM | POA: Diagnosis present

## 2021-09-12 DIAGNOSIS — M1990 Unspecified osteoarthritis, unspecified site: Secondary | ICD-10-CM | POA: Diagnosis present

## 2021-09-12 DIAGNOSIS — M109 Gout, unspecified: Secondary | ICD-10-CM | POA: Diagnosis present

## 2021-09-12 DIAGNOSIS — G8252 Quadriplegia, C1-C4 incomplete: Secondary | ICD-10-CM | POA: Diagnosis not present

## 2021-09-12 DIAGNOSIS — J441 Chronic obstructive pulmonary disease with (acute) exacerbation: Secondary | ICD-10-CM | POA: Diagnosis present

## 2021-09-12 DIAGNOSIS — Z20822 Contact with and (suspected) exposure to covid-19: Secondary | ICD-10-CM | POA: Diagnosis present

## 2021-09-12 DIAGNOSIS — S42131D Displaced fracture of coracoid process, right shoulder, subsequent encounter for fracture with routine healing: Secondary | ICD-10-CM | POA: Diagnosis not present

## 2021-09-12 DIAGNOSIS — J9601 Acute respiratory failure with hypoxia: Secondary | ICD-10-CM | POA: Diagnosis present

## 2021-09-12 DIAGNOSIS — D649 Anemia, unspecified: Secondary | ICD-10-CM | POA: Diagnosis not present

## 2021-09-12 DIAGNOSIS — L8990 Pressure ulcer of unspecified site, unspecified stage: Secondary | ICD-10-CM | POA: Insufficient documentation

## 2021-09-12 DIAGNOSIS — E669 Obesity, unspecified: Secondary | ICD-10-CM | POA: Diagnosis present

## 2021-09-12 DIAGNOSIS — I10 Essential (primary) hypertension: Secondary | ICD-10-CM | POA: Diagnosis not present

## 2021-09-12 DIAGNOSIS — R0902 Hypoxemia: Principal | ICD-10-CM | POA: Diagnosis present

## 2021-09-12 DIAGNOSIS — G8250 Quadriplegia, unspecified: Secondary | ICD-10-CM | POA: Diagnosis present

## 2021-09-12 DIAGNOSIS — Z86718 Personal history of other venous thrombosis and embolism: Secondary | ICD-10-CM | POA: Diagnosis not present

## 2021-09-12 DIAGNOSIS — Z743 Need for continuous supervision: Secondary | ICD-10-CM | POA: Diagnosis not present

## 2021-09-12 DIAGNOSIS — M25551 Pain in right hip: Secondary | ICD-10-CM | POA: Diagnosis present

## 2021-09-12 DIAGNOSIS — J449 Chronic obstructive pulmonary disease, unspecified: Secondary | ICD-10-CM | POA: Diagnosis present

## 2021-09-12 DIAGNOSIS — D509 Iron deficiency anemia, unspecified: Secondary | ICD-10-CM | POA: Diagnosis not present

## 2021-09-12 DIAGNOSIS — Z6828 Body mass index (BMI) 28.0-28.9, adult: Secondary | ICD-10-CM

## 2021-09-12 DIAGNOSIS — R069 Unspecified abnormalities of breathing: Secondary | ICD-10-CM | POA: Diagnosis not present

## 2021-09-12 DIAGNOSIS — S2231XD Fracture of one rib, right side, subsequent encounter for fracture with routine healing: Secondary | ICD-10-CM | POA: Diagnosis not present

## 2021-09-12 DIAGNOSIS — Z7901 Long term (current) use of anticoagulants: Secondary | ICD-10-CM

## 2021-09-12 DIAGNOSIS — Z471 Aftercare following joint replacement surgery: Secondary | ICD-10-CM | POA: Diagnosis not present

## 2021-09-12 DIAGNOSIS — Z79891 Long term (current) use of opiate analgesic: Secondary | ICD-10-CM

## 2021-09-12 DIAGNOSIS — Z87891 Personal history of nicotine dependence: Secondary | ICD-10-CM

## 2021-09-12 DIAGNOSIS — Z79899 Other long term (current) drug therapy: Secondary | ICD-10-CM

## 2021-09-12 DIAGNOSIS — M5000 Cervical disc disorder with myelopathy, unspecified cervical region: Secondary | ICD-10-CM | POA: Diagnosis not present

## 2021-09-12 LAB — COMPREHENSIVE METABOLIC PANEL
ALT: 23 U/L (ref 0–44)
AST: 20 U/L (ref 15–41)
Albumin: 2.8 g/dL — ABNORMAL LOW (ref 3.5–5.0)
Alkaline Phosphatase: 79 U/L (ref 38–126)
Anion gap: 11 (ref 5–15)
BUN: 11 mg/dL (ref 8–23)
CO2: 31 mmol/L (ref 22–32)
Calcium: 8.9 mg/dL (ref 8.9–10.3)
Chloride: 99 mmol/L (ref 98–111)
Creatinine, Ser: 0.62 mg/dL (ref 0.61–1.24)
GFR, Estimated: >60 mL/min (ref >60)
Glucose, Bld: 100 mg/dL — ABNORMAL HIGH (ref 70–99)
Potassium: 3.4 mmol/L — ABNORMAL LOW (ref 3.5–5.1)
Sodium: 141 mmol/L (ref 135–145)
Total Bilirubin: 1.1 mg/dL (ref 0.3–1.2)
Total Protein: 6.3 g/dL — ABNORMAL LOW (ref 6.5–8.1)

## 2021-09-12 LAB — TROPONIN I (HIGH SENSITIVITY)
Troponin I (High Sensitivity): 7 ng/L (ref <18)
Troponin I (High Sensitivity): 7 ng/L (ref <18)

## 2021-09-12 LAB — URINALYSIS, ROUTINE W REFLEX MICROSCOPIC
Bilirubin Urine: NEGATIVE
Glucose, UA: NEGATIVE mg/dL
Hgb urine dipstick: NEGATIVE
Ketones, ur: NEGATIVE mg/dL
Leukocytes,Ua: NEGATIVE
Nitrite: NEGATIVE
Protein, ur: NEGATIVE mg/dL
Specific Gravity, Urine: 1.006 (ref 1.005–1.030)
pH: 7 (ref 5.0–8.0)

## 2021-09-12 LAB — CBC WITH DIFFERENTIAL/PLATELET
Abs Immature Granulocytes: 0.09 10*3/uL — ABNORMAL HIGH (ref 0.00–0.07)
Basophils Absolute: 0.1 10*3/uL (ref 0.0–0.1)
Basophils Relative: 0 %
Eosinophils Absolute: 0 10*3/uL (ref 0.0–0.5)
Eosinophils Relative: 0 %
HCT: 36.7 % — ABNORMAL LOW (ref 39.0–52.0)
Hemoglobin: 11.6 g/dL — ABNORMAL LOW (ref 13.0–17.0)
Immature Granulocytes: 1 %
Lymphocytes Relative: 9 %
Lymphs Abs: 1.5 10*3/uL (ref 0.7–4.0)
MCH: 29.6 pg (ref 26.0–34.0)
MCHC: 31.6 g/dL (ref 30.0–36.0)
MCV: 93.6 fL (ref 80.0–100.0)
Monocytes Absolute: 1.2 10*3/uL — ABNORMAL HIGH (ref 0.1–1.0)
Monocytes Relative: 7 %
Neutro Abs: 14.3 10*3/uL — ABNORMAL HIGH (ref 1.7–7.7)
Neutrophils Relative %: 83 %
Platelets: 480 10*3/uL — ABNORMAL HIGH (ref 150–400)
RBC: 3.92 MIL/uL — ABNORMAL LOW (ref 4.22–5.81)
RDW: 15 % (ref 11.5–15.5)
WBC: 17.2 10*3/uL — ABNORMAL HIGH (ref 4.0–10.5)
nRBC: 0 % (ref 0.0–0.2)

## 2021-09-12 LAB — LACTIC ACID, PLASMA: Lactic Acid, Venous: 1.5 mmol/L (ref 0.5–1.9)

## 2021-09-12 LAB — SARS CORONAVIRUS 2 BY RT PCR: SARS Coronavirus 2 by RT PCR: POSITIVE — AB

## 2021-09-12 LAB — BRAIN NATRIURETIC PEPTIDE: B Natriuretic Peptide: 233.9 pg/mL — ABNORMAL HIGH (ref 0.0–100.0)

## 2021-09-12 IMAGING — CR DG CHEST 2V
2 series · 2 of 2 positions shown · non-contrast
Comparison: [DATE] Radiograph.

CLINICAL DATA: shortness of breath, cough

EXAM:
CHEST - 2 VIEW

[chest lat]
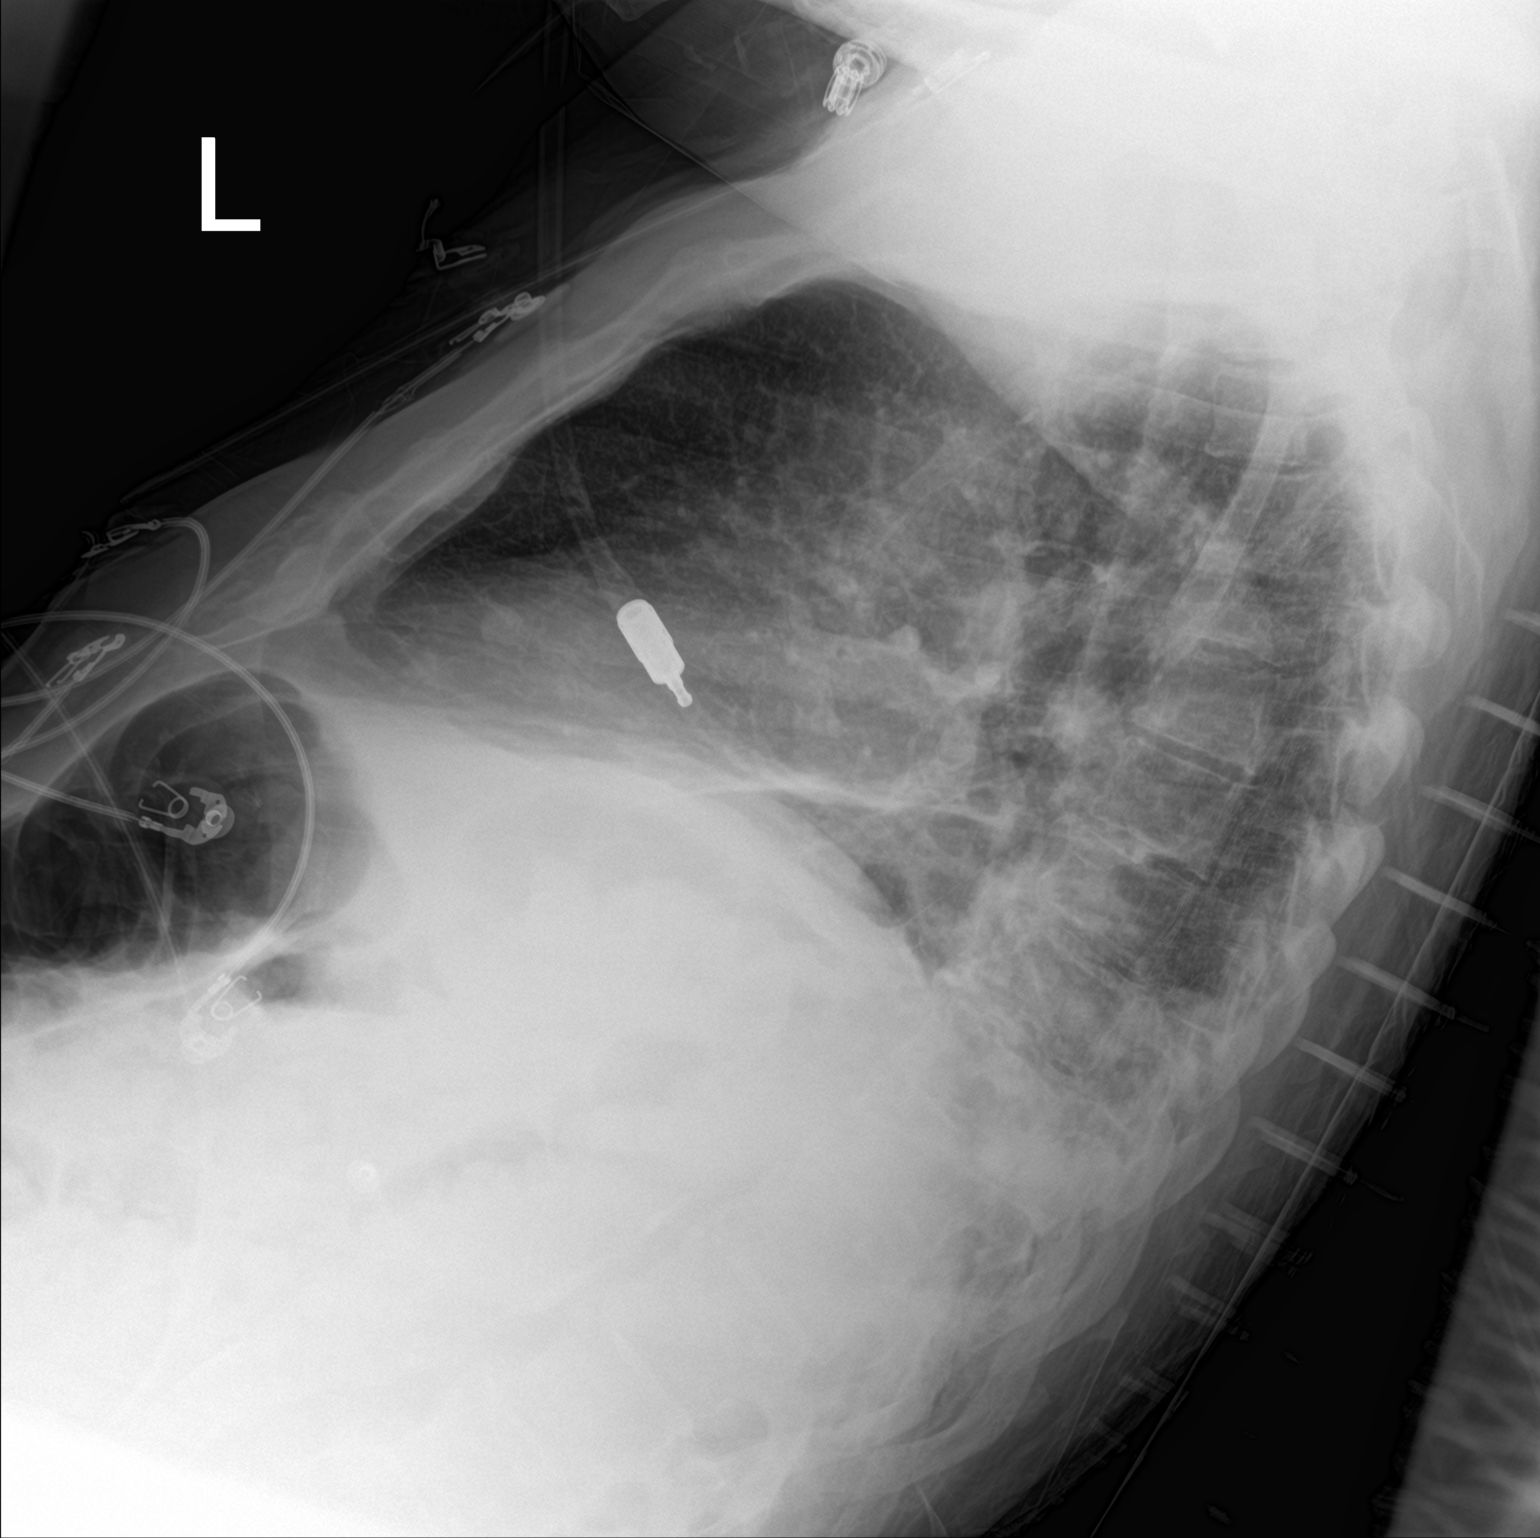

[chest ap]
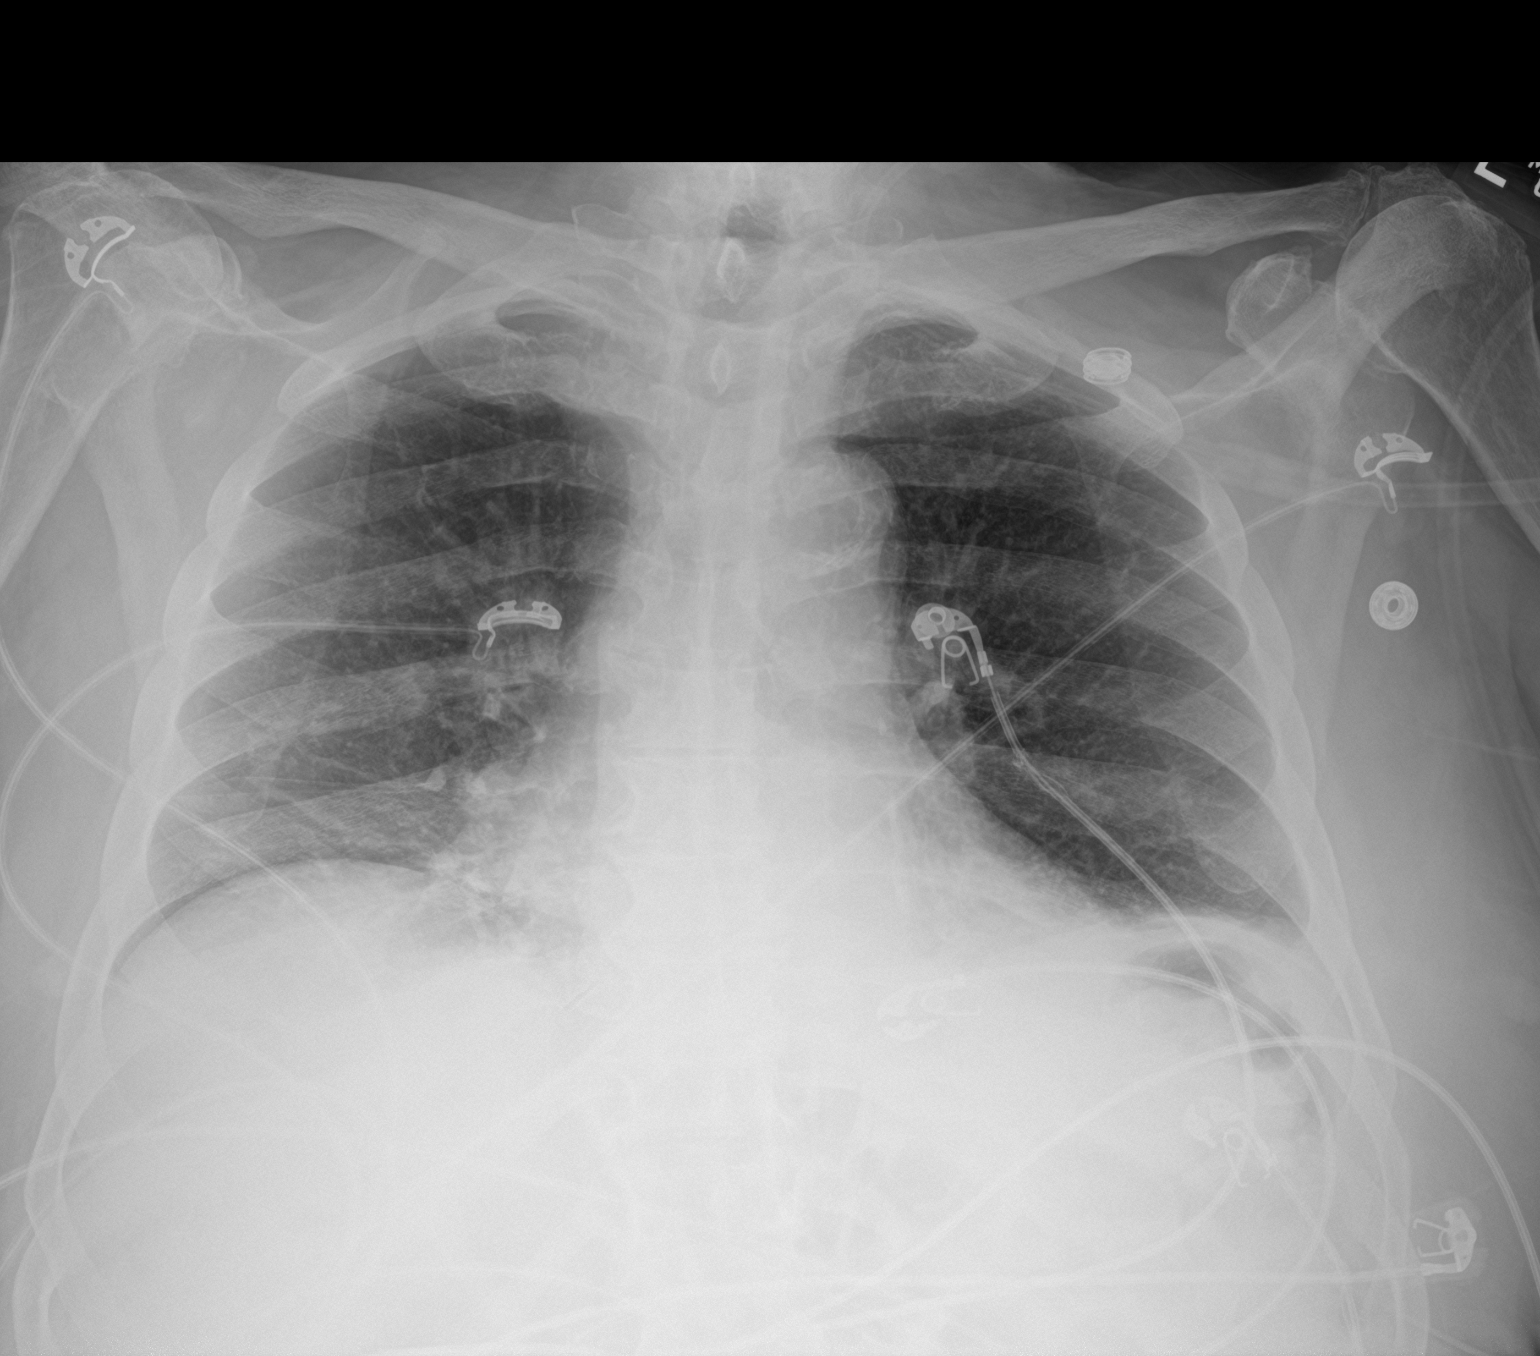

[2 of 2 positions shown; findings below may reference images not displayed]

FINDINGS: The heart size and mediastinal contours are within normal limits.
Aortic atherosclerosis. Low lung volumes with bibasilar opacities
favor atelectasis. No visible pleural effusion or pneumothorax.
Anterior cervical fusion hardware.
IMPRESSION: Low lung volumes with bibasilar opacities favor atelectasis.

## 2021-09-12 MED ORDER — VANCOMYCIN HCL IN DEXTROSE 1-5 GM/200ML-% IV SOLN
1000.0000 mg | Freq: Two times a day (BID) | INTRAVENOUS | Status: DC
Start: 2021-09-13 — End: 2021-09-13
  Administered 2021-09-13: 1000 mg via INTRAVENOUS
  Filled 2021-09-12: qty 200

## 2021-09-12 MED ORDER — LACTATED RINGERS IV BOLUS
500.0000 mL | Freq: Once | INTRAVENOUS | Status: AC
  Administered 2021-09-12: 500 mL via INTRAVENOUS

## 2021-09-12 MED ORDER — APIXABAN 5 MG PO TABS
5.0000 mg | ORAL_TABLET | Freq: Two times a day (BID) | ORAL | Status: DC
  Administered 2021-09-12 – 2021-09-15 (×6): 5 mg via ORAL
  Filled 2021-09-12 (×6): qty 1

## 2021-09-12 MED ORDER — LIDOCAINE 5 % EX PTCH: 1.0000 | MEDICATED_PATCH | Freq: Every day | CUTANEOUS | Status: DC | PRN

## 2021-09-12 MED ORDER — ALLOPURINOL 300 MG PO TABS
300.0000 mg | ORAL_TABLET | Freq: Every day | ORAL | Status: DC
Start: 2021-09-12 — End: 2021-09-20
  Administered 2021-09-12 – 2021-09-20 (×9): 300 mg via ORAL
  Filled 2021-09-12 (×7): qty 1
  Filled 2021-09-12: qty 3
  Filled 2021-09-12: qty 1

## 2021-09-12 MED ORDER — SODIUM CHLORIDE 0.9% FLUSH
3.0000 mL | Freq: Two times a day (BID) | INTRAVENOUS | Status: DC
  Administered 2021-09-12 – 2021-09-20 (×16): 3 mL via INTRAVENOUS

## 2021-09-12 MED ORDER — SODIUM CHLORIDE 0.9 % IV SOLN
2.0000 g | Freq: Once | INTRAVENOUS | Status: AC
  Administered 2021-09-12: 2 g via INTRAVENOUS
  Filled 2021-09-12: qty 12.5

## 2021-09-12 MED ORDER — SODIUM CHLORIDE 0.9 % IV SOLN
2.0000 g | Freq: Three times a day (TID) | INTRAVENOUS | Status: DC
  Administered 2021-09-12 – 2021-09-13 (×2): 2 g via INTRAVENOUS
  Filled 2021-09-12 (×2): qty 12.5

## 2021-09-12 MED ORDER — SODIUM CHLORIDE 0.9 % IV SOLN: 250.0000 mL | INTRAVENOUS | Status: DC | PRN

## 2021-09-12 MED ORDER — PREDNISONE 20 MG PO TABS
40.0000 mg | ORAL_TABLET | Freq: Every day | ORAL | Status: AC
  Administered 2021-09-13 – 2021-09-17 (×5): 40 mg via ORAL
  Filled 2021-09-12 (×5): qty 2

## 2021-09-12 MED ORDER — SODIUM CHLORIDE 0.9% FLUSH: 3.0000 mL | INTRAVENOUS | Status: DC | PRN

## 2021-09-12 MED ORDER — VANCOMYCIN HCL 2000 MG/400ML IV SOLN
2000.0000 mg | Freq: Once | INTRAVENOUS | Status: AC
  Administered 2021-09-12: 2000 mg via INTRAVENOUS
  Filled 2021-09-12: qty 400

## 2021-09-12 MED ORDER — ACETAMINOPHEN 325 MG PO TABS
650.0000 mg | ORAL_TABLET | Freq: Once | ORAL | Status: AC
  Administered 2021-09-12: 650 mg via ORAL
  Filled 2021-09-12: qty 2

## 2021-09-12 MED ORDER — LACTATED RINGERS IV BOLUS: 1000.0000 mL | Freq: Once | INTRAVENOUS | Status: DC

## 2021-09-12 NOTE — ED Provider Notes (Signed)
[facility] EMERGENCY DEPARTMENT Provider Note   CSN: [id number] Arrival date & time: 09/12/21  1443     History  Chief Complaint  Patient presents with   Shortness of Breath    Logan [name] is a 76 y.o. male with a history of atrial fibrillation (on Eliquis), HTN, CHF, HLD, DM, DVT/PE presenting to the ED with generalized weakness.  Patient was just recently discharged from his rehab facility about 1 week ago and returned home.  He states that over the past few days, he has become progressively more weak and is now unable to ambulate.  He is typically able to ambulate with a walker.  He also notes a cough that started a few days ago productive of green sputum.  Does endorse some intermittent shortness of breath.  Denies any chest pain.  Does state that he thinks he had a fever over 100 today.  He also had a recent diagnosis of C. difficile but states that he completed these antibiotics 1 to 2 weeks ago but is continued to have some diarrhea.  Denies any abdominal pain, nausea/vomiting, dysuria.  Per EMS, patient was found hypoxic on their arrival so was initiated on 3-4 L via nasal cannula.   Shortness of Breath Associated symptoms: cough   Associated symptoms: no abdominal pain, no chest pain, no fever and no vomiting       Home Medications Prior to Admission medications   Medication Sig Start Date End Date Taking? Authorizing Provider  allopurinol (ZYLOPRIM) 300 MG tablet Take 300 mg by mouth daily.   Yes [provider]  apixaban (ELIQUIS) 5 MG TABS tablet Take 2 tablets (10 mg total) by mouth 2 (two) times daily for 2 days, THEN 1 tablet (5 mg total) 2 (two) times daily. 06/13/21 09/12/21 Yes Elodia Florence., MD  carvedilol (COREG) 12.5 MG tablet Take 12.5 mg by mouth 2 (two) times daily. 08/24/21  Yes [provider]  DHEA 25 MG CAPS Take 25 mg by mouth daily.   Yes [provider]  diclofenac Sodium (VOLTAREN) 1 % GEL Apply 2 g  topically 4 (four) times daily. To left hip Patient taking differently: Apply 2 g topically 4 (four) times daily as needed (pain). To left hip 01/30/21  Yes Love, Ivan Anchors, PA-C  fluticasone (FLONASE) 50 MCG/ACT nasal spray Place 1 spray into both nostrils daily as needed for rhinitis.    Yes [provider]  furosemide (LASIX) 40 MG tablet Take 1 tablet (40 mg total) by mouth daily. 01/30/21  Yes Love, Ivan Anchors, PA-C  loratadine (CLARITIN) 10 MG tablet Take 10 mg by mouth daily as needed for allergies.   Yes [provider]  Menthol, Topical Analgesic, (ICY HOT EX) Apply 1 g topically daily as needed (pain).   Yes [provider]  potassium chloride (KLOR-CON) 10 MEQ tablet Take 10 mEq by mouth 2 (two) times daily. 08/27/21  Yes [provider]  Probiotic Product (PROBIOTIC PO) Take 1 capsule by mouth in the morning and at bedtime.   Yes [provider]  traMADol (ULTRAM) 50 MG tablet Take 100 mg by mouth every 8 (eight) hours as needed for moderate pain. 07/26/21  Yes [provider]  ascorbic acid (VITAMIN C) 500 MG tablet Take 1 tablet (500 mg total) by mouth daily. Patient not taking: Reported on 09/12/2021 01/30/21   Bary Leriche, PA-C  Multiple Vitamin (MULTIVITAMIN) tablet Take 1 tablet by mouth daily. Patient not taking:  Reported on 09/12/2021    [provider]  omeprazole (PRILOSEC) 20 MG capsule Take 20 mg by mouth daily. Patient not taking: Reported on 09/12/2021    [provider]  oxyCODONE-acetaminophen (PERCOCET/ROXICET) 5-325 MG tablet Take 1 tablet by mouth every 4 (four) hours as needed for severe pain. Patient not taking: Reported on 09/12/2021 06/05/21   Leighton Parody, PA-C  simethicone [facility]) 80 MG chewable tablet Chew 2 tablets (160 mg total) by mouth 4 (four) times daily. Patient not taking: Reported on 09/12/2021 01/30/21   Love, Ivan Anchors, PA-C  tiZANidine (ZANAFLEX) 2 MG tablet Take 1 tablet (2 mg  total) by mouth every 6 (six) hours as needed. Patient not taking: Reported on 09/12/2021 06/05/21   Leighton Parody, PA-C  traZODone (DESYREL) 50 MG tablet Take 1 tablet (50 mg total) by mouth at bedtime as needed for sleep. Patient not taking: Reported on 09/12/2021 03/04/21   Courtney Heys, MD      Allergies    Patient has no known allergies.    Review of Systems   Review of Systems  Constitutional:  Negative for fever.  Respiratory:  Positive for cough and shortness of breath.   Cardiovascular:  Negative for chest pain.  Gastrointestinal:  Positive for diarrhea. Negative for abdominal pain, nausea and vomiting.  Genitourinary:  Negative for dysuria.  Neurological:  Positive for weakness. Negative for seizures.   Physical Exam Updated Vital Signs BP (!) 91/55   Pulse (!) 41   Temp 98.1 F (36.7 C) (Oral)   Resp 18   Wt 82.6 kg   SpO2 97%   BMI 28.51 kg/m  Physical Exam Vitals and nursing note reviewed.  Constitutional:      General: He is not in acute distress.    Appearance: He is obese. He is ill-appearing. He is not toxic-appearing or diaphoretic.     Comments: Elderly chronically ill-appearing.  HENT:     Head: Normocephalic and atraumatic.  Cardiovascular:     Rate and Rhythm: Normal rate. Rhythm irregular.     Heart sounds: No murmur heard.   No friction rub. No gallop.  Pulmonary:     Effort: No accessory muscle usage or respiratory distress.     Breath sounds: No stridor. Examination of the right-upper field reveals decreased breath sounds. Examination of the left-upper field reveals decreased breath sounds. Examination of the right-middle field reveals decreased breath sounds. Examination of the left-middle field reveals decreased breath sounds. Examination of the right-lower field reveals decreased breath sounds. Examination of the left-lower field reveals decreased breath sounds. Decreased breath sounds (Mild) present. No wheezing or rhonchi.     Comments:  Mildly tachypneic. Abdominal:     Palpations: Abdomen is soft.     Tenderness: There is no abdominal tenderness. There is no guarding or rebound.  Musculoskeletal:     Cervical back: Neck supple.     Right lower leg: No tenderness. Edema present.     Left lower leg: No tenderness. Edema present.  Skin:    General: Skin is warm and dry.  Neurological:     General: No focal deficit present.     Mental Status: He is alert and oriented to person, place, and time.    ED Results / Procedures / Treatments   Labs (all labs ordered are listed, but only abnormal results are displayed) Labs Reviewed  CBC WITH DIFFERENTIAL/PLATELET - Abnormal; Notable for the following components:      Result Value  WBC 17.2 (*)    RBC 3.92 (*)    Hemoglobin 11.6 (*)    HCT 36.7 (*)    Platelets 480 (*)    Neutro Abs 14.3 (*)    Monocytes Absolute 1.2 (*)    Abs Immature Granulocytes 0.09 (*)    All other components within normal limits  COMPREHENSIVE METABOLIC PANEL - Abnormal; Notable for the following components:   Potassium 3.4 (*)    Glucose, Bld 100 (*)    Total Protein 6.3 (*)    Albumin 2.8 (*)    All other components within normal limits  URINALYSIS, ROUTINE W REFLEX MICROSCOPIC - Abnormal; Notable for the following components:   Color, Urine STRAW (*)    All other components within normal limits  BRAIN NATRIURETIC PEPTIDE - Abnormal; Notable for the following components:   B Natriuretic Peptide 233.9 (*)    All other components within normal limits  CULTURE, BLOOD (ROUTINE X 2)  CULTURE, BLOOD (ROUTINE X 2)  MRSA NEXT GEN BY PCR, NASAL  LACTIC ACID, PLASMA  TROPONIN I (HIGH SENSITIVITY)  TROPONIN I (HIGH SENSITIVITY)    EKG EKG Interpretation  Date/Time:  Thursday Sep 12 2021 15:12:13 EDT Ventricular Rate:  77 PR Interval:    QRS Duration: 81 QT Interval:  398 QTC Calculation: 451 R Axis:   33 Text Interpretation: Atrial fibrillation Low voltage, precordial leads Probable  anteroseptal infarct, old Abnormal ECG Confirmed by Carmin Muskrat [phone]) on 09/12/2021 3:17:31 PM  Radiology DG Chest 2 View  Result Date: 09/12/2021 CLINICAL DATA:  shortness of breath, cough EXAM: CHEST - 2 VIEW COMPARISON:  Sep 02, 2021 Radiograph. FINDINGS: The heart size and mediastinal contours are within normal limits. Aortic atherosclerosis. Low lung volumes with bibasilar opacities favor atelectasis. No visible pleural effusion or pneumothorax. Anterior cervical fusion hardware. IMPRESSION: Low lung volumes with bibasilar opacities favor atelectasis. Electronically Signed   By: Dahlia Bailiff M.D.   On: 09/12/2021 15:39    Procedures Procedures    Medications Ordered in ED Medications  vancomycin (VANCOREADY) IVPB 2000 mg/400 mL (2,000 mg Intravenous New Bag/Given 09/12/21 1622)  ceFEPIme (MAXIPIME) 2 g in sodium chloride 0.9 % 100 mL IVPB (has no administration in time range)  vancomycin (VANCOCIN) IVPB 1000 mg/200 mL premix (has no administration in time range)  lactated ringers bolus 1,000 mL (has no administration in time range)  lactated ringers bolus 500 mL (0 mLs Intravenous Stopped 09/12/21 1621)  acetaminophen (TYLENOL) tablet 650 mg (650 mg Oral Given 09/12/21 1549)  ceFEPIme (MAXIPIME) 2 g in sodium chloride 0.9 % 100 mL IVPB (0 g Intravenous Stopped 09/12/21 1621)  lactated ringers bolus 500 mL (500 mLs Intravenous New Bag/Given 09/12/21 1711)    ED Course/ Medical Decision Making/ A&P                           Medical Decision Making Amount and/or Complexity of Data Reviewed Labs: ordered. Decision-making details documented in ED Course. Radiology: ordered and independent interpretation performed. Decision-making details documented in ED Course. ECG/medicine tests: ordered and independent interpretation performed. Decision-making details documented in ED Course.  Risk OTC drugs. Prescription drug management. Decision regarding hospitalization.   Logan [name] is  a 76 y.o. male with a history of atrial fibrillation (on Eliquis), HTN, CHF, HLD, DM, DVT/PE presenting to the ED with generalized weakness.   On exam, the patient is febrile too 100.30F (rectal), hemodynamically stable, and chronically ill-appearing.  He is  actively coughing throughout exam and has mildly decreased breath sounds throughout all lung fields.  He does have 2+ pitting edema of the bilateral lower extremities.  He is also currently on 3-4 L via nasal cannula.  He is not on oxygen at baseline.  I am most concerned for recurrence of pneumonia given his fever and productive cough.  Will obtain chest x-ray and lab work for further evaluation.  ECG obtained shows atrial fibrillation with no acute ischemic changes, largely unchanged from prior.  CBC notable for leukocytosis to 17.2.  Hemoglobin is 11.6.  CMP with mild hypokalemia to 3.4.  UA is not concerning for infection.  BNP is mildly elevated to 233.  Troponin is normal.  Lactic acid is not elevated.  Chest x-ray does show bibasilar opacities, thought to be atelectasis, but given patient's fever, leukocytosis, and productive cough, I am concerned for pneumonia.  In the setting of patient's fever, tachypnea, and leukocytosis, he does meet sepsis criteria.  Patient was initiated on IV vancomycin and IV cefepime for treatment of likely pneumonia (given patient had a recent discharge from SNF, will treat for HCAP).  Blood cultures x2 obtained.  Patient given 1 L of IV fluids.  Patient was noted to be hypotensive with systolics in the 96E after 500 cc of fluid, so the full 1 L was completed but he remained hypotensive.  Will give the full 30cc/kg of IV fluids (2.5 L) given concern for sepsis.  In the setting of his likely pneumonia, sepsis, and new oxygen requirement, patient will require admission.  Hospitalist was contacted for admission the patient was admitted.        Final Clinical Impression(s) / ED Diagnoses Final diagnoses:   Hypoxia  Sepsis with acute hypoxic respiratory failure and septic shock, due to unspecified organism [facility])    Rx / [place] Orders ED Discharge Orders     None         Sondra Come, MD 09/12/21 Marko Stai    Carmin Muskrat, MD 09/12/21 [phone]

## 2021-09-12 NOTE — Progress Notes (Signed)
FPTS Brief Progress Note  S:Patient sleeping comfortably    O: BP (!) 92/58   Pulse 67   Temp 98.3 F (36.8 C) (Oral)   Resp 18   Wt 82.6 kg   SpO2 97%   BMI 28.51 kg/m     A/P:  Acute hypoxic respiratory failure Likely 2/2 COPD exacerbation vs pneumonia. Currently satting 97% on 3L . BP 92/58 - if MAP <65 will give small bolus  - continue vanc and cefipime - prednisone '40mg'$  daily   - Orders reviewed. Labs for AM ordered, which was adjusted as needed.   Remainder of plan per day team H&P  Shary Key, DO 09/12/2021, 11:21 PM PGY-2, [place] Night Resident  Please page [phone] with questions.

## 2021-09-12 NOTE — ED Notes (Signed)
Foster MD at bedside, made aware of pts BP.

## 2021-09-12 NOTE — H&P (Signed)
[facility] Admission History and Physical Service Pager: [phone]  Patient name: Logan [name] Medical record number: [id number] Date of birth: 10-Dec-1945 Age: 76 y.o. Gender: male  Primary Care Provider: [name], MD Consultants: None Code Status: Full Preferred Emergency Contact: Son Jabir Dahlem [phone]  Chief Complaint: weakness, SOB, productive cough  Assessment and Plan: Larnce Schnackenberg is a 76 y.o. male presenting with Generalized weakness, SOB, productive cough . PMH is significant for HFpEF 55%, HTN, PE, DVT, aspiration pneumonia, A-fib on Eliquis, T2DM, gout, HLD, cervical disc disease.   Productive cough, shortness of breath, new oxygen requirement Acute onset, 2-day duration.  Pneumonia versus new COPD diagnosis versus HFpEF exacerbation.  CXR shows bibasilar atelectasis without consolidation, lungs on physical exam CTAB, requiring 4 L O2 in ED, WBC to 17, BNP 230 (unchanged from February).  Has a 30 to 60 pack year smoking history, stopped in 1998.  ED started coverage for hospital-acquired pneumonia with Vanc and cefepime per pharmacy.  Most likely COPD exacerbation given smoking history, shortness of breath, productive cough with sputum albeit discounted due to no previous diagnosis of COPD.  HFpEF is possibility, but BNP not markedly elevated and only evidence of fluid overload on physical exam is in the ankles; lungs are clear and he reports good med compliance to his daily Lasix.  Pneumonia considered given multiple recent hospitalizations, current oxygen requirement, and mild fever; however physical exam and CXR unsupportive.  Also briefly considered PE, however patient has normal heart rate and is on full dose Eliquis for A-fib without any missed doses. - Admit to progressive with Dr. Erin Hearing attending - We will likely narrow antibiotics to Augmentin tomorrow, currently covered for 24 hours - Start prednisone 40 mg daily x5 days -  Incentive spirometry - Up out of bed, ambulate with assistance - PT eval and treat - Wean oxygen as tolerated  Hypotension Around 5 PM, patient noted to become somewhat hypotensive with BP 80s/50s on average.  MAPs responded well to 1 L LR bolus.  Declined to give another bolus given history of HFpEF and BLE edema.  Suspect due to extra blood pressure medication patient was instructed to take by home health nurse this morning at home, less so a sepsis picture from pulmonary process discussed above.   - Admit to progressive for close BP monitoring, will likely downgrade once MAPs stable  Right hip and knee pain S/p left hip replacement in February and uses walker at baseline.  Suspect compensatory gait patterns.  No red flags on physical exam. - Lidocaine patch as needed Q24 - Up to chair every shift - Up out of bed with assistance - PT eval and treat  Recent C. difficile Patient and son report he has finished his antibiotic treatment for C. difficile obtained while in SNF.  Still having 1 watery stool a day. Cannot retest this soon, will be falsely positive - Contact precautions  HTN   HFpEF 55% Reportedly at home takes carvedilol 12 5 mg twice daily, Lasix 40 mg daily, and losartan 50 mg as needed daily on days when systolics >086 (however losartan not on our current med list). - Hold all home BP medications at this time - Can consider restart of carvedilol and Lasix when MAPs normalize consistently  A-fib on Eliquis  PE  DVT A-fib on admission with curiously regular ventricular rate.  History of ablation 02/2020 and cardioversion 09/2020. Continue home Eliquis 5 mg twice daily.  T2DM Diet controlled at home.  We will give steroids for presumed COPD exacerbation, however do not expect him to need sliding scale insulin at this time.  We will follow glucose on daily BMPs.  No extra CBGs at this time.  Gout Continue home allopurinol 300 mg daily.  Stage I sacral pressure  ulcer Overlying medial sacrum and right medial buttocks.  Somewhat painful with sitting.  - Up out of bed with assistance - Turn patient every 2 hours - Up to chair every shift   FEN/GI: Regular diet Prophylaxis: Full dose Eliquis  Disposition: Progressive  History of Present Illness:  Logan [name] is a 76 y.o. male presenting with 2-day history of worsening generalized weakness, shortness of breath, and productive cough.  Patient has had quite the significant 6 months in terms of medical happenings.  Was in a SNF for rehab a number of times.  Cervical spine surgery (ACDF C3-4) last September.  Left hip replacement left early February.  Aspiration pneumonia in mid February, treated at [facility] long; was sent to [facility] home for rehab.  While at the SNF, he caught COVID and then C. difficile sequentially.  Did well with COVID, did not need oxygen supplementally. He has completed his treatment for C. difficile and now has watery diarrhea only once a day in the mornings. Just came home from SNF last week only to develop generalized weakness, shortness of breath, and productive cough on Tuesday.  Worsened Wednesday with tactile fevers and chills.  This morning he had a physical therapist and a home health nurse come out to see him; they were both worried about him given his current status.  The home health nurse reportedly told him to take an extra Lasix (around 1 PM) because his blood pressure was "high" with systolics 277-824M.  She then told him to call an ambulance and take him to the hospital to be evaluated.  Patient relates that he did not receive any medications in EMS.  ED provider noted his blood pressures were low normal for EMS.  Upon presentation to the ED, he was noted to have a low fever of 100.8 F, was normotensive, and slightly hypoxic requiring 4 L O2.  Of note, no baseline oxygen requirement.  Blood cultures drawn, started on hap treatment with cefepime and vancomycin per  pharmacy.  Troponins negative, BNP elevated to 223.  Around 5 PM, nurse noted blood pressures began to seek into low normal and then low, with systolics in the 35T, diastolics 61W. Patient received a total of 1000 mL LR bolus in ED. MAP responded appropriately to fluid bolus, reaching 70 before provider left the room.  Patient reports he takes carvedilol and Lasix daily for his blood pressure.  He also reports he takes losartan 50 mg daily as needed when his blood pressure reaches >431 systolic (this losartan is not on his med list at our facility).  Patient ambulates with walker at baseline.   No baseline oxygen requirement.  No previous COPD diagnosis formally, however does have a 30-60 pack-year history of smoking.  He manages his own medications, which he does sort by medication itself and not by a.m./p.m. administration.  He takes his medications by site out of his pill sorter.  Review Of Systems: Per HPI with the following additions:   Review of Systems  Constitutional:  Positive for activity change, chills, diaphoresis, fatigue and fever.  Respiratory:  Positive for cough and shortness of breath. Negative for choking, chest tightness and wheezing.   Cardiovascular:  Positive for  leg swelling. Negative for chest pain and palpitations.  Gastrointestinal:  Positive for diarrhea. Negative for abdominal distention, abdominal pain, anal bleeding, blood in stool, constipation and nausea.  Endocrine: Negative for cold intolerance and heat intolerance.  Genitourinary:  Negative for decreased urine volume, difficulty urinating, dysuria, flank pain, frequency, hematuria and urgency.  Neurological:  Positive for weakness. Negative for dizziness, syncope, light-headedness and headaches.    Patient Active Problem List   Diagnosis Date Noted   Closed coracoid process fracture 06/13/2021   Rib fractures 06/13/2021   Acute pulmonary embolism ([place]) 06/07/2021   Acute respiratory failure with hypoxia  (HCC) 06/07/2021   Aspiration pneumonia ([place]) 06/07/2021   H/O total hip arthroplasty, left 06/03/2021   Osteoarthritis 03/07/2021   Chronic incomplete quadriplegia ([place]) 03/04/2021   Abnormal gait 03/04/2021   Chronic pain syndrome 03/04/2021   Insomnia due to medical condition 03/04/2021   Shoulder subluxation, right 01/31/2021   Hypoalbuminemia due to protein-calorie malnutrition (HCC)    Hypokalemia    Macrocytic anemia    Prediabetes    Cervical disc disease with myelopathy 01/03/2021   Cervical myelopathy ([place]) 01/01/2021   Pneumonia 11/05/2020   Cervical radiculopathy 11/05/2020   Acute on chronic heart failure ([place]) 11/05/2020   Seasonal allergies    Orthopnea    Morbid obesity ([place])    DVT (deep venous thrombosis) ([place])    Bilateral lower extremity edema    Secondary hypercoagulable state ([place]) 03/28/2020   Persistent atrial fibrillation ([place]) 10/21/2019   Essential hypertension 10/21/2019   Chronic diastolic heart failure ([place]) 10/21/2019   Mixed hyperlipidemia 10/21/2019   Obesity (BMI 30-39.9) 08/24/2019   Sleep apnea    Primary osteoarthritis of left hip    Hypertension    Hyperlipidemia    Gout    Esophageal reflux    Diabetes mellitus without complication (HCC)    Atrial fibrillation ([place])    Anxiety    Atherosclerotic renal artery stenosis, unilateral ([place]) 02/28/2014   Past Medical History: Past Medical History:  Diagnosis Date   Anxiety    Atherosclerotic renal artery stenosis, unilateral (HCC) 02/28/2014   Atrial fibrillation (HCC)    Bilateral lower extremity edema    Chronic diastolic heart failure ([place]) 10/21/2019   Diabetes mellitus without complication (HCC)    DVT (deep venous thrombosis) (HCC)    Dysrhythmia    Esophageal reflux    Essential hypertension 10/21/2019   Gout    Hyperlipidemia    Hypertension    Mixed hyperlipidemia 10/21/2019   Morbid obesity ([place])    Obesity (BMI 30-39.9) 08/24/2019   Orthopnea    Osteoarthritis     Persistent atrial fibrillation ([place]) 10/21/2019   Pneumonia    Seasonal allergies    Sleep apnea    Past Surgical History: Past Surgical History:  Procedure Laterality Date   ABDOMINAL AORTAGRAM N/A 02/28/2014   Procedure: ABDOMINAL Maxcine Ham;  Surgeon: Serafina Mitchell, MD;  Location[facility] C[facility] LAB;  Service: Cardiovascular;  Laterality: N/A;   ANTERIOR CERVICAL DECOMP/DISCECTOMY FUSION N/A 01/01/2021   Procedure: ACDF C3-4;  Surgeon: Dawley, Theodoro Doing, DO;  Location: [facility];  Service: Neurosurgery;  Laterality: N/A;   APPENDECTOMY     ATRIAL FIBRILLATION ABLATION N/A 02/29/2020   Procedure: ATRIAL FIBRILLATION ABLATION;  Surgeon: Constance Haw, MD;  Location: [facility] CV LAB;  Service: Cardiovascular;  Laterality: N/A;   CARDIOVERSION N/A 10/18/2020   Procedure: CARDIOVERSION (CATH LAB);  Surgeon: Constance Haw, MD;  Location: [facility] CV LAB;  Service: Cardiovascular;  Laterality: N/A;   CARPAL TUNNEL RELEASE Left    TOTAL HIP ARTHROPLASTY Left 06/03/2021   Procedure: LEFT TOTAL HIP ARTHROPLASTY ANTERIOR APPROACH;  Surgeon: Frederik Pear, MD;  Location: [facility] ORS;  Service: Orthopedics;  Laterality: Left;   Social History: Social History   Tobacco Use   Smoking status: Former   Smokeless tobacco: Former    Types: Nurse, children's Use: Never used  Substance Use Topics   Alcohol use: Never   Drug use: Never   Please also refer to relevant sections of EMR.  Family History: Family History  Problem Relation Age of Onset   Heart disease Mother    Cancer Father        throat cancer   Heart disease Brother    Atrial fibrillation Brother    Allergies and Medications: No Known Allergies No current facility-administered medications on file prior to encounter.   Current Outpatient Medications on File Prior to Encounter  Medication Sig Dispense Refill   allopurinol (ZYLOPRIM) 300 MG tablet Take 300 mg by mouth daily.     apixaban (ELIQUIS) 5 MG TABS tablet  Take 2 tablets (10 mg total) by mouth 2 (two) times daily for 2 days, THEN 1 tablet (5 mg total) 2 (two) times daily. 68 tablet 0   carvedilol (COREG) 12.5 MG tablet Take 12.5 mg by mouth 2 (two) times daily.     DHEA 25 MG CAPS Take 25 mg by mouth daily.     diclofenac Sodium (VOLTAREN) 1 % GEL Apply 2 g topically 4 (four) times daily. To left hip (Patient taking differently: Apply 2 g topically 4 (four) times daily as needed (pain). To left hip) 400 g 0   fluticasone (FLONASE) 50 MCG/ACT nasal spray Place 1 spray into both nostrils daily as needed for rhinitis.      furosemide (LASIX) 40 MG tablet Take 1 tablet (40 mg total) by mouth daily. 30 tablet 0   loratadine (CLARITIN) 10 MG tablet Take 10 mg by mouth daily as needed for allergies.     Menthol, Topical Analgesic, (ICY HOT EX) Apply 1 g topically daily as needed (pain).     potassium chloride (KLOR-CON) 10 MEQ tablet Take 10 mEq by mouth 2 (two) times daily.     Probiotic Product (PROBIOTIC PO) Take 1 capsule by mouth in the morning and at bedtime.     traMADol (ULTRAM) 50 MG tablet Take 100 mg by mouth every 8 (eight) hours as needed for moderate pain.     ascorbic acid (VITAMIN C) 500 MG tablet Take 1 tablet (500 mg total) by mouth daily. (Patient not taking: Reported on 09/12/2021) 30 tablet 0   Multiple Vitamin (MULTIVITAMIN) tablet Take 1 tablet by mouth daily. (Patient not taking: Reported on 09/12/2021)     omeprazole (PRILOSEC) 20 MG capsule Take 20 mg by mouth daily. (Patient not taking: Reported on 09/12/2021)     oxyCODONE-acetaminophen (PERCOCET/ROXICET) 5-325 MG tablet Take 1 tablet by mouth every 4 (four) hours as needed for severe pain. (Patient not taking: Reported on 09/12/2021) 30 tablet 0   simethicone (MYLICON) 80 MG chewable tablet Chew 2 tablets (160 mg total) by mouth 4 (four) times daily. (Patient not taking: Reported on 09/12/2021) 120 tablet 0   tiZANidine (ZANAFLEX) 2 MG tablet Take 1 tablet (2 mg total) by mouth every 6  (six) hours as needed. (Patient not taking: Reported on 09/12/2021) 60 tablet 0   traZODone (DESYREL) 50  MG tablet Take 1 tablet (50 mg total) by mouth at bedtime as needed for sleep. (Patient not taking: Reported on 09/12/2021) 30 tablet 5    Objective: BP (!) 91/55   Pulse (!) 41   Temp 98.1 F (36.7 C) (Oral)   Resp 18   Wt 82.6 kg   SpO2 97%   BMI 28.51 kg/m  Exam: General: Awake, alert, ANO x4 Eyes: Sclera anicteric, EOM intact ENTM: No dentures in Cardiovascular: A-fib on monitor, curiously regular ventricular rate, no murmurs, BLE 1+ swelling of feet and ankles, R worse than L Respiratory: CTAB A/P, referred upper airway sounds with coughing Gastrointestinal: Abdomen soft, nontender to palpation, no rebound tenderness  Derm: Stage I pressure ulcer overlying the sacrum and right medial buttocks, small abrasion overlying right mid-shin, no redness to either LE Neuro: cranial nerves 2-10 grossly intact Psych: normal insight and judgement  Labs and Imaging: CBC BMET  Recent Labs  Lab 09/12/21 1505  WBC 17.2*  HGB 11.6*  HCT 36.7*  PLT 480*   Recent Labs  Lab 09/12/21 1505  NA 141  K 3.4*  CL 99  CO2 31  BUN 11  CREATININE 0.62  GLUCOSE 100*  CALCIUM 8.9     EKG: Atrial fibrillation, ventricular rate 77, QTc 451  CHEST - 2 VIEW 09/12/2021  COMPARISON:  Sep 02, 2021 Radiograph. FINDINGS: The heart size and mediastinal contours are within normal limits. Aortic atherosclerosis. Low lung volumes with bibasilar opacities favor atelectasis. No visible pleural effusion or pneumothorax. Anterior cervical fusion hardware. IMPRESSION: Low lung volumes with bibasilar opacities favor atelectasis.  Ezequiel Essex, MD 09/12/2021, 6:25 PM PGY-2, [place] Intern pager: [phone], text pages welcome

## 2021-09-12 NOTE — ED Triage Notes (Signed)
Pt BIB [facility] from home. Pt has a hx of pna, seen at [facility] 2 months prior, then had cdiff from abx. Seen by home health RN today, reports decline in health since Tuesday. Normally ambulates with walker, reports increased generalized weakness. Pt also has productive cough, EMS reports diminished lung sounds throughout all fields. Home health nurse reports pts BP was 056 systolic, administered losartan and lasix, EMS reports last BP was 92 systolic. EMS also reports possible dislocated R shoulder but pt reports no acute issues with shoulder.

## 2021-09-12 NOTE — Progress Notes (Signed)
Pharmacy Antibiotic Note  Logan [name] is a 76 y.o. male admitted on 09/12/2021 with pneumonia.  Pharmacy has been consulted for vancomycin and cefepime dosing.  Patient with productive cough and green sputum with recent discharge from rehab facility. Has recent C. Diff but completed antibiotic course, however still has some diarrhea.   Tmax 100.35F, WBC 17.2, Scr 0.62  Plan: Cefepime 2g q8h Vancomycin 2g x1 then 1g q12h F/u cultures, clinical course, ability to de-escalate, LOT, and vancomycin levels as appropraite    Temp (24hrs), Avg:100.1 F (37.8 C), Min:99.4 F (37.4 C), Max:100.8 F (38.2 C)  No results for input(s): WBC, CREATININE, LATICACIDVEN, VANCOTROUGH, VANCOPEAK, VANCORANDOM, GENTTROUGH, GENTPEAK, GENTRANDOM, TOBRATROUGH, TOBRAPEAK, TOBRARND, AMIKACINPEAK, AMIKACINTROU, AMIKACIN in the last 168 hours.  CrCl cannot be calculated (Patient's most recent lab result is older than the maximum 21 days allowed.).    No Known Allergies  Antimicrobials this admission: 5/25 cefepime >>  5/25 vancomycin >>   Dose adjustments this admission: N/A  Microbiology results: 5/25 BCx: sent 5/25 MRSA PCR: sent  Thank you for allowing pharmacy to be a part of this patient's care.  Logan [name] 09/12/2021 3:32 PM

## 2021-09-12 NOTE — Progress Notes (Signed)
Family medicine teaching service will be admitting this patient. Our pager information can be located in the physician sticky notes, treatment team sticky notes, and the headers of all our official daily progress notes.   FAMILY MEDICINE TEACHING SERVICE Patient - Please contact intern pager [phone] or text page via website [email or web address] (login: [email or web address]) for questions regarding care. DO NOT page listed attending provider unless there is no answer from the number above.   Logan Huyser, MD PGY-2,  Family Medicine Service pager [phone]   

## 2021-09-13 DIAGNOSIS — M1990 Unspecified osteoarthritis, unspecified site: Secondary | ICD-10-CM | POA: Diagnosis present

## 2021-09-13 DIAGNOSIS — J449 Chronic obstructive pulmonary disease, unspecified: Secondary | ICD-10-CM

## 2021-09-13 DIAGNOSIS — K644 Residual hemorrhoidal skin tags: Secondary | ICD-10-CM | POA: Diagnosis present

## 2021-09-13 DIAGNOSIS — K625 Hemorrhage of anus and rectum: Secondary | ICD-10-CM | POA: Diagnosis not present

## 2021-09-13 DIAGNOSIS — M109 Gout, unspecified: Secondary | ICD-10-CM | POA: Diagnosis present

## 2021-09-13 DIAGNOSIS — I959 Hypotension, unspecified: Secondary | ICD-10-CM | POA: Diagnosis present

## 2021-09-13 DIAGNOSIS — G894 Chronic pain syndrome: Secondary | ICD-10-CM | POA: Diagnosis present

## 2021-09-13 DIAGNOSIS — Z86711 Personal history of pulmonary embolism: Secondary | ICD-10-CM | POA: Diagnosis not present

## 2021-09-13 DIAGNOSIS — E669 Obesity, unspecified: Secondary | ICD-10-CM | POA: Diagnosis present

## 2021-09-13 DIAGNOSIS — D6859 Other primary thrombophilia: Secondary | ICD-10-CM | POA: Diagnosis present

## 2021-09-13 DIAGNOSIS — J441 Chronic obstructive pulmonary disease with (acute) exacerbation: Secondary | ICD-10-CM | POA: Diagnosis present

## 2021-09-13 DIAGNOSIS — S2231XD Fracture of one rib, right side, subsequent encounter for fracture with routine healing: Secondary | ICD-10-CM | POA: Diagnosis not present

## 2021-09-13 DIAGNOSIS — L89151 Pressure ulcer of sacral region, stage 1: Secondary | ICD-10-CM | POA: Diagnosis present

## 2021-09-13 DIAGNOSIS — M25551 Pain in right hip: Secondary | ICD-10-CM | POA: Diagnosis present

## 2021-09-13 DIAGNOSIS — G8250 Quadriplegia, unspecified: Secondary | ICD-10-CM | POA: Diagnosis present

## 2021-09-13 DIAGNOSIS — D638 Anemia in other chronic diseases classified elsewhere: Secondary | ICD-10-CM | POA: Diagnosis present

## 2021-09-13 DIAGNOSIS — S42131D Displaced fracture of coracoid process, right shoulder, subsequent encounter for fracture with routine healing: Secondary | ICD-10-CM | POA: Diagnosis not present

## 2021-09-13 DIAGNOSIS — K921 Melena: Secondary | ICD-10-CM | POA: Diagnosis not present

## 2021-09-13 DIAGNOSIS — I5032 Chronic diastolic (congestive) heart failure: Secondary | ICD-10-CM | POA: Diagnosis present

## 2021-09-13 DIAGNOSIS — Z86718 Personal history of other venous thrombosis and embolism: Secondary | ICD-10-CM | POA: Diagnosis not present

## 2021-09-13 DIAGNOSIS — M5000 Cervical disc disorder with myelopathy, unspecified cervical region: Secondary | ICD-10-CM | POA: Diagnosis not present

## 2021-09-13 DIAGNOSIS — R54 Age-related physical debility: Secondary | ICD-10-CM | POA: Diagnosis present

## 2021-09-13 DIAGNOSIS — E876 Hypokalemia: Secondary | ICD-10-CM | POA: Diagnosis present

## 2021-09-13 DIAGNOSIS — R0902 Hypoxemia: Principal | ICD-10-CM | POA: Diagnosis present

## 2021-09-13 DIAGNOSIS — Z20822 Contact with and (suspected) exposure to covid-19: Secondary | ICD-10-CM | POA: Diagnosis present

## 2021-09-13 DIAGNOSIS — I110 Hypertensive heart disease with heart failure: Secondary | ICD-10-CM | POA: Diagnosis present

## 2021-09-13 DIAGNOSIS — L8990 Pressure ulcer of unspecified site, unspecified stage: Secondary | ICD-10-CM

## 2021-09-13 DIAGNOSIS — E782 Mixed hyperlipidemia: Secondary | ICD-10-CM | POA: Diagnosis present

## 2021-09-13 DIAGNOSIS — I2699 Other pulmonary embolism without acute cor pulmonale: Secondary | ICD-10-CM | POA: Diagnosis not present

## 2021-09-13 DIAGNOSIS — J9601 Acute respiratory failure with hypoxia: Secondary | ICD-10-CM | POA: Diagnosis present

## 2021-09-13 DIAGNOSIS — E119 Type 2 diabetes mellitus without complications: Secondary | ICD-10-CM | POA: Diagnosis present

## 2021-09-13 DIAGNOSIS — Z8619 Personal history of other infectious and parasitic diseases: Secondary | ICD-10-CM | POA: Diagnosis not present

## 2021-09-13 DIAGNOSIS — G8252 Quadriplegia, C1-C4 incomplete: Secondary | ICD-10-CM | POA: Diagnosis not present

## 2021-09-13 DIAGNOSIS — Z471 Aftercare following joint replacement surgery: Secondary | ICD-10-CM | POA: Diagnosis not present

## 2021-09-13 DIAGNOSIS — R195 Other fecal abnormalities: Secondary | ICD-10-CM | POA: Diagnosis not present

## 2021-09-13 DIAGNOSIS — D509 Iron deficiency anemia, unspecified: Secondary | ICD-10-CM | POA: Diagnosis not present

## 2021-09-13 DIAGNOSIS — I4891 Unspecified atrial fibrillation: Secondary | ICD-10-CM | POA: Diagnosis present

## 2021-09-13 DIAGNOSIS — E785 Hyperlipidemia, unspecified: Secondary | ICD-10-CM | POA: Diagnosis not present

## 2021-09-13 DIAGNOSIS — J690 Pneumonitis due to inhalation of food and vomit: Secondary | ICD-10-CM | POA: Diagnosis not present

## 2021-09-13 HISTORY — DX: Pressure ulcer of unspecified site, unspecified stage: L89.90

## 2021-09-13 LAB — BASIC METABOLIC PANEL
Anion gap: 11 (ref 5–15)
BUN: 14 mg/dL (ref 8–23)
CO2: 27 mmol/L (ref 22–32)
Calcium: 8.5 mg/dL — ABNORMAL LOW (ref 8.9–10.3)
Chloride: 102 mmol/L (ref 98–111)
Creatinine, Ser: 0.66 mg/dL (ref 0.61–1.24)
GFR, Estimated: >60 mL/min (ref >60)
Glucose, Bld: 110 mg/dL — ABNORMAL HIGH (ref 70–99)
Potassium: 3 mmol/L — ABNORMAL LOW (ref 3.5–5.1)
Sodium: 140 mmol/L (ref 135–145)

## 2021-09-13 LAB — CBC
HCT: 28.9 % — ABNORMAL LOW (ref 39.0–52.0)
Hemoglobin: 9.5 g/dL — ABNORMAL LOW (ref 13.0–17.0)
MCH: 29.6 pg (ref 26.0–34.0)
MCHC: 32.9 g/dL (ref 30.0–36.0)
MCV: 90 fL (ref 80.0–100.0)
Platelets: 400 10*3/uL (ref 150–400)
RBC: 3.21 MIL/uL — ABNORMAL LOW (ref 4.22–5.81)
RDW: 15 % (ref 11.5–15.5)
WBC: 15.8 10*3/uL — ABNORMAL HIGH (ref 4.0–10.5)
nRBC: 0 % (ref 0.0–0.2)

## 2021-09-13 LAB — MRSA NEXT GEN BY PCR, NASAL: MRSA by PCR Next Gen: NOT DETECTED

## 2021-09-13 MED ORDER — POTASSIUM CHLORIDE CRYS ER 20 MEQ PO TBCR
40.0000 meq | EXTENDED_RELEASE_TABLET | Freq: Two times a day (BID) | ORAL | Status: AC
  Administered 2021-09-13 (×2): 40 meq via ORAL
  Filled 2021-09-13 (×2): qty 2

## 2021-09-13 MED ORDER — SODIUM CHLORIDE 0.9 % IV SOLN: INTRAVENOUS | Status: AC

## 2021-09-13 MED ORDER — SALINE SPRAY 0.65 % NA SOLN
1.0000 | NASAL | Status: DC | PRN
  Filled 2021-09-13: qty 44

## 2021-09-13 MED ORDER — IPRATROPIUM-ALBUTEROL 0.5-2.5 (3) MG/3ML IN SOLN
3.0000 mL | Freq: Four times a day (QID) | RESPIRATORY_TRACT | Status: DC | PRN
  Administered 2021-09-14 – 2021-09-16 (×2): 3 mL via RESPIRATORY_TRACT
  Filled 2021-09-13 (×4): qty 3

## 2021-09-13 MED ORDER — AZITHROMYCIN 250 MG PO TABS
500.0000 mg | ORAL_TABLET | Freq: Every day | ORAL | Status: AC
  Administered 2021-09-13 – 2021-09-15 (×3): 500 mg via ORAL
  Filled 2021-09-13 (×3): qty 2

## 2021-09-13 MED ORDER — AMOXICILLIN-POT CLAVULANATE 500-125 MG PO TABS: 500.0000 mg | ORAL_TABLET | Freq: Three times a day (TID) | ORAL | Status: DC

## 2021-09-13 NOTE — Progress Notes (Signed)
error 

## 2021-09-13 NOTE — Hospital Course (Signed)
                PCP recommendations Consider PFTs as an outpatient

## 2021-09-13 NOTE — Social Work (Addendum)
CSW spoke with pt about going to SNF, pt stated that he did not want to go to a NSF bc he got COVID in a facility back in [place]. Pt states he would like to go home, he has PT coming to his house and he now lives with his son and DIL.   CSW contacted pt son, no answer CSW left a message. CSW also attempted to contact pt DIL no answer.   Pt son contacted CSW and confirmed that pt has help at home. Pt DIL is at home with pt during the day and pt son it home all day Monday through Thursday. PT comes twice a week and pt will stay with son until he is able to live alone again.

## 2021-09-13 NOTE — TOC Initial Note (Addendum)
Transition of Care [facility]) - Initial/Assessment Note    Patient Details  Name: Logan [name] MRN: [id number] Date of Birth: [birth year]  [facility] CM/SW Contact:    Verdell Carmine, RN Phone Number: 09/13/2021, 8:43 AM  Clinical Narrative:                 75 year old with recent hip replacement in February, with history of smoking ( quit in 1998), Atrial fibrillation on DOAC, Respiratory issues,  presented with [facility], productive cough. He was found to be COVID positive and is currently on 3L with saturations high 90's . Hypotensive with bolus given.  PT consulted likely need HH. Uses walker at home.   CM will follow for needs, recommendations, and transitions.  1200 PT in recommending SNF. Referral Sent to CSW for consideration  Expected Discharge Plan: [place] Barriers to Discharge: Continued Medical Work up   Patient Goals and CMS Choice        Expected Discharge Plan and Services Expected Discharge Plan: [place]   Discharge Planning Services: CM Consult   Living arrangements for the past 2 months: Single Family Home                                      Prior Living Arrangements/Services Living arrangements for the past 2 months: Single Family Home   Patient language and need for interpreter reviewed:: Yes        Need for Family Participation in Patient Care: Yes (Comment) Care giver support system in place?: Yes (comment) Current home services: DME (walker) Criminal Activity/Legal Involvement Pertinent to Current Situation/Hospitalization: No - Comment as needed  Activities of Daily Living      Permission Sought/Granted                  Emotional Assessment       Orientation: : Oriented to Self, Oriented to Place, Oriented to  Time, Oriented to Situation   Psych Involvement: No (comment)  Admission diagnosis:  COPD (chronic obstructive pulmonary disease) ([place]) [J44.9] Hypoxia  [R09.02] Sepsis with acute hypoxic respiratory failure and septic shock, due to unspecified organism ([place]) [A41.9, R65.21, J96.01] Patient Active Problem List   Diagnosis Date Noted   Pressure injury of skin 09/13/2021   COPD (chronic obstructive pulmonary disease) ([place]) 09/12/2021   Closed coracoid process fracture 06/13/2021   Rib fractures 06/13/2021   Acute pulmonary embolism ([place]) 06/07/2021   Acute respiratory failure with hypoxia ([place]) 06/07/2021   Aspiration pneumonia ([place]) 06/07/2021   H/O total hip arthroplasty, left 06/03/2021   Osteoarthritis 03/07/2021   Chronic incomplete quadriplegia ([place]) 03/04/2021   Abnormal gait 03/04/2021   Chronic pain syndrome 03/04/2021   Insomnia due to medical condition 03/04/2021   Shoulder subluxation, right 01/31/2021   Hypoalbuminemia due to protein-calorie malnutrition (HCC)    Hypokalemia    Macrocytic anemia    Prediabetes    Cervical disc disease with myelopathy 01/03/2021   Cervical myelopathy ([place]) 01/01/2021   Pneumonia 11/05/2020   Cervical radiculopathy 11/05/2020   Acute on chronic heart failure ([place]) 11/05/2020   Seasonal allergies    Orthopnea    Morbid obesity ([place])    DVT (deep venous thrombosis) ([place])    Bilateral lower extremity edema    Secondary hypercoagulable state ([place]) 03/28/2020   Persistent atrial fibrillation ([place]) 10/21/2019   Essential hypertension 10/21/2019  Chronic diastolic heart failure ([place]) 10/21/2019   Mixed hyperlipidemia 10/21/2019   Obesity (BMI 30-39.9) 08/24/2019   Sleep apnea    Primary osteoarthritis of left hip    Hypertension    Hyperlipidemia    Gout    Esophageal reflux    Diabetes mellitus without complication [facility])    Atrial fibrillation (HCC)    Anxiety    Atherosclerotic renal artery stenosis, unilateral ([place]) 02/28/2014   PCP:  [name], MD Pharmacy:   [place] [place] [address] [place]  [address] Phone: [phone] Fax: [phone]     Social Determinants of Health (SDOH) Interventions    Readmission Risk Interventions    01/03/2021    2:46 PM  Readmission Risk Prevention Plan  Post Dischage Appt Not Complete  Appt Comments going to INPT rehab  Medication Screening Complete  Transportation Screening Complete

## 2021-09-13 NOTE — TOC Progression Note (Addendum)
Transition of Care [facility]) - Progression Note    Patient Details  Name: Logan [name] MRN: [id number] Date of Birth: 09-02-1945  Transition of Care [facility]) CM/SW [place], RN Phone Number: 09/13/2021, 4:06 PM  Clinical Narrative:    Patient is active with [facility]. [place] representative for [facility] called. He is checking on the disciplines used.   As per documentation from CSW son and patient want to continue with [facility] post hospitalization.  1720 Patient is active for PT and RN according to [place]  Will continue to follow for needs, recommendations, and transitions  Expected Discharge Plan: [place] Barriers to Discharge: Continued Medical Work up  Expected Discharge Plan and Services Expected Discharge Plan: [place]   Discharge Planning Services: CM Consult   Living arrangements for the past 2 months: Single Family Home                                       Social Determinants of Health (SDOH) Interventions    Readmission Risk Interventions    01/03/2021    2:46 PM  Readmission Risk Prevention Plan  Post Dischage Appt Not Complete  Appt Comments going to INPT rehab  Medication Screening Complete  Transportation Screening Complete

## 2021-09-13 NOTE — Consult Note (Signed)
   [facility] CM Inpatient Consult   09/13/2021  Logan [name] 1945-08-04 [id number]  [place] Organization [ACO] Patient: [employer]  Primary Care Provider:  [name], MD, [facility], is an [place] provider with a Chronic Care Management team available    Patient screened for recent hospitalization with a [facility] RNCM out reach recently currently noted in observation status.    Plan:  No current follow up with [facility] CM as patient's care management is with an external CCM team for needs.  For questions contact:   Natividad Brood, RN BSN [facility] Liaison  [phone] business mobile phone Toll free office [phone]  Fax number: [phone] [email or web address]

## 2021-09-13 NOTE — Evaluation (Signed)
Physical Therapy Evaluation Patient Details Name: Logan [name] MRN: [id number] DOB: Oct 23, 1945 Today's Date: 09/13/2021  History of Present Illness  The pt is a 76 yo male presenting 5/25 with generalized weakness, and productive cough. Undergoing work up for PNA vs new COPD vs CHF exacerbation. PMH includes: afib, HTN, CHF, HLD, DM II, DVT/PE.   Clinical Impression  Pt in bed upon arrival of PT, agreeable to evaluation at this time. Prior to admission the pt states he was recently independent with use of RW and tub bench to mobilize in his home and complete ADLs. However, he also states he moved in with his son ~1 week PTA and since then has needed increased assistance from his son for all transfers, gait and ADLs. The pt now presents with limitations in functional mobility, strength, ROM, power, coordination, stability, and endurance due to above dx, and will continue to benefit from skilled PT to address these deficits. The pt required up to modA to manage bed mobility, and was found to be soiled of BM in the bed and unaware. The pt was then able to complete sit-stand transfers with modA, blocking of RLE, and significantly increased time to rise from elevated bed. He completed x5 through session to allow for cleaning and transfer to [facility] as the pt remained incontinent of diarrhea through the session. Given significant deficits in strength, power, stability, and awareness, recommend SNF rehab to facilitate functional recovery and reduce caregiver burden.         Recommendations for follow up therapy are one component of a multi-disciplinary discharge planning process, led by the attending physician.  Recommendations may be updated based on patient status, additional functional criteria and insurance authorization.  Follow Up Recommendations Skilled nursing-short term rehab (<3 hours/day)    Assistance Recommended at Discharge Frequent or constant Supervision/Assistance  Patient can return home with  the following  A lot of help with walking and/or transfers;A lot of help with bathing/dressing/bathroom;Assistance with cooking/housework;Direct supervision/assist for medications management;Direct supervision/assist for financial management;Assist for transportation;Help with stairs or ramp for entrance    Equipment Recommendations None recommended by PT  Recommendations for Other Services  OT consult    Functional Status Assessment Patient has had a recent decline in their functional status and demonstrates the ability to make significant improvements in function in a reasonable and predictable amount of time.     Precautions / Restrictions Precautions Precautions: Fall Precaution Comments: incontinent of BM, watch O2 Restrictions Weight Bearing Restrictions: No      Mobility  Bed Mobility Overal bed mobility: Needs Assistance Bed Mobility: Supine to Sit     Supine to sit: Mod assist     General bed mobility comments: pt able to use LUE to pull up on bed rail, needs assist with BLE and due to poor functional use of RUE. pt found soiled of BM in bed, reports he was unaware    Transfers Overall transfer level: Needs assistance Equipment used: Rolling walker (2 wheels) Transfers: Sit to/from Stand, Bed to chair/wheelchair/BSC Sit to Stand: Mod assist, From elevated surface   Step pivot transfers: Mod assist       General transfer comment: pt completed x5 sit-stand through session, needing up to [facility] withmax cues for hand placement and blocking of R foot to complete stand. pt with poor forward wt shift or hip flexion, pushing posteriorly with all attempts to stand. poor functional use of BLE or BUE    Ambulation/Gait Ambulation/Gait assistance: Mod assist Gait Distance (Feet): 3  Feet (+ 5) Assistive device: Rolling walker (2 wheels) Gait Pattern/deviations: Step-to pattern, Decreased stride length Gait velocity: decreased     General Gait Details: small lateral steps  to [facility], then forward steps to recliner. modA to steady and manage RW, pt with minimal clearance and poor stability despite BUE support     Balance Overall balance assessment: Needs assistance Sitting-balance support: Bilateral upper extremity supported, Feet supported Sitting balance-Leahy Scale: Poor Sitting balance - Comments: dependent on BUE support Postural control: Posterior lean Standing balance support: Bilateral upper extremity supported, During functional activity Standing balance-Leahy Scale: Poor Standing balance comment: dependent on BUE support and min-modA                             Pertinent Vitals/Pain Pain Assessment Pain Assessment: No/denies pain    Home Living Family/patient expects to be discharged to:: Private residence Living Arrangements: Children Available Help at Discharge: Family;Available 24 hours/day Type of Home: House Home Access: Stairs to enter Entrance Stairs-Rails: Right Entrance Stairs-Number of Steps: 3-4   Home Layout: Able to live on main level with bedroom/bathroom Home Equipment: Conservation officer, nature (2 wheels);Wheelchair - manual;Grab bars - tub/shower;BSC/3in1;Tub bench;Other (comment) Additional Comments: pt reccently moved in with son and daughter in law and 54yo grandchild    Prior Function Prior Level of Function : Needs assist             Mobility Comments: pt reports he has been independent with RW prior to last few days, then has needed assist from son to complete all transfers and mobility. ADLs Comments: pt reports independent with use of tub bench     Hand Dominance        Extremity/Trunk Assessment   Upper Extremity Assessment Upper Extremity Assessment: RUE deficits/detail RUE Deficits / Details: pt reports he dislocated R shoulder 1 year ago, waiting on repair. limited ROM and functional strength    Lower Extremity Assessment Lower Extremity Assessment: Generalized weakness;RLE deficits/detail RLE  Deficits / Details: poor coordination and limited functional strength with transfers, unable to achieve full knee extension RLE Coordination: decreased fine motor;decreased gross motor    Cervical / Trunk Assessment Cervical / Trunk Assessment: Kyphotic  Communication   Communication: Other (comment) (difficult to understand)  Cognition Arousal/Alertness: Awake/alert Behavior During Therapy: Flat affect Overall Cognitive Status: Impaired/Different from baseline Area of Impairment: Attention, Following commands, Safety/judgement, Awareness, Problem solving                   Current Attention Level: Focused   Following Commands: Follows one step commands with increased time Safety/Judgement: Decreased awareness of safety, Decreased awareness of deficits Awareness: Intellectual Problem Solving: Slow processing, Decreased initiation, Difficulty sequencing, Requires verbal cues General Comments: pt needing cues for mobility throughout, unaware of bowel incontinence, unable to clearly describe PLOF or need for assist, pt giving slightly different answers with each question.        General Comments General comments (skin integrity, edema, etc.): VSS on RA. pt incontinent of diarrhea through session        Assessment/Plan    PT Assessment Patient needs continued PT services  PT Problem List Decreased strength;Decreased range of motion;Decreased activity tolerance;Decreased balance;Decreased mobility;Decreased coordination;Decreased cognition;Decreased safety awareness       PT Treatment Interventions DME instruction;Gait training;Stair training;Functional mobility training;Therapeutic activities;Therapeutic exercise;Balance training;Patient/family education    PT Goals (Current goals can be found in the Care Plan section)  Acute Rehab PT Goals  Patient Stated Goal: none stated, return home PT Goal Formulation: With patient Time For Goal Achievement: 09/27/21 Potential to  Achieve Goals: Fair    Frequency Min 3X/week        AM-PAC PT "6 Clicks" Mobility  Outcome Measure Help needed turning from your back to your side while in a flat bed without using bedrails?: A Lot Help needed moving from lying on your back to sitting on the side of a flat bed without using bedrails?: A Lot Help needed moving to and from a bed to a chair (including a wheelchair)?: A Lot Help needed standing up from a chair using your arms (e.g., wheelchair or bedside chair)?: A Lot Help needed to walk in hospital room?: Total Help needed climbing 3-5 steps with a railing? : Total 6 Click Score: 10    End of Session Equipment Utilized During Treatment: Gait belt Activity Tolerance: Patient limited by fatigue Patient left: in chair;with call bell/phone within reach;with chair alarm set Nurse Communication: Mobility status;Need for lift equipment (incontinent of loose stool) PT Visit Diagnosis: Other abnormalities of gait and mobility (R26.89);Muscle weakness (generalized) (M62.81)    Time: 3354-5625 PT Time Calculation (min) (ACUTE ONLY): 51 min   Charges:   PT Evaluation $PT Eval Moderate Complexity: 1 Mod PT Treatments $Therapeutic Exercise: 8-22 mins $Therapeutic Activity: 23-37 mins        [place], PT, DPT   Acute Rehabilitation Department Pager #: [phone]  Sandra Cockayne 09/13/2021, 11:35 AM

## 2021-09-13 NOTE — Progress Notes (Addendum)
Family Medicine Teaching Service Daily Progress Note Intern Pager: [phone]  Patient name: Logan [name] Medical record number: [id number] Date of birth: Dec 06, 1945 Age: 76 y.o. Gender: male  Primary Care Provider: [name], MD Consultants: None Code Status: Full  Pt Overview and Major Events to Date:  5/25- admitted  Assessment and Plan: Logan [name] is a 76 year old male presenting with generalized weakness, SOB, productive cough.  PMH is significant for HFpEF 55%, HTN, PE, DVT, aspiration pneumonia, A-fib on Eliquis, T2DM, gout, HLD, cervical disc disease.   Productive cough, SOB  and New O2 requirement  Presented with 2 days of acute generalized weakness and dyspnea with new oxygen requirement. Concern for possible COPD, CHF exacerbation or viral Pneumonia considering long history of tobacco use, leukocytosis on admission and history of CHF. Presentation is more concerning for COPD. CXR show bibasilar atelectasis and no consolidation.  Nasal MRSA swab was negative WBC this morning is downtrending 15.8. currently on 1L [place] and sating in 90s. On exam clear breath sounds bilaterally with +1 BLE edema. Patient reports mild improvement with dyspnea but endorses cough productive of green sputum. -S/p vancomycin and cefepime -Consider starting Azithromycin x 3 days   -Continue prednisone '40mg'$  (2/5 days) -PT/OT eval and treat -Wean oxygen as tolerated -Continue routine vitals -Started DuoNeb PRN -Step down to Med Tele    Anemia  Hgb today was 9.5 from 11.5 on admission with MCV of 90. Likely normocytic anemia in setting of chronic diease. His anemia is likely contributing to his worsening dyspnea. -mIVF 74m/hr for 12 -CBC with morning Lab   Hypotension  Hypotensive on admission and overnight BP has ranged from 95-97/47-55.  Map improved to above 65. Continue holding home blood pressure medications.  Right hip and knee pain Status post left hip replacement in February and uses  walker at baseline. -Continue lidocaine patch -Follow-up with assistance -PT/OT eval and treat  Recent C. difficile Completed C. difficile treatment.  Still reports watery diarrhea. -Continue contact/enteric precautions  HFpEF 55%  HTN Home medication include carvedilol 12.5 mg twice daily, 640 mg daily and losartan 50 mg daily. -Holding home medications due to hypotension  T2DM Diet controlled and stable.  CBG on admission has been euglycemic.  Positive for COVID Patient recently d/c from SNF last week and report testing positive for COVID 3/35. His COVID test was positive on DC. Low concern for reinfection but not absolutely ruled out.  Hx of PE and DVT  AFIB on Eliquis Telemetry on admission shows A-fib.  As of ablation 01/09/2020 and cardioversion 2022.  On home medication of Eliquis 5 mg twice daily -Continue home Eliquis  Gout Stable.  On home medication of allopurinol 300 mg daily.   Stage I sacral pressure ulcer Site is located in the medial sacrum and right medial buttocks. -Turn patient every 2 hours -Up with assistance  FEN/GI: Thin diet PPx: Eliquis Dispo:Home pending clinical improvement . Barriers include Clinical status.   Subjective:  Patient was sitting comfortable in bed. Reports mild improvement in dyspnea. Denies any chest pain.   Objective: Temp:  [97.7 F (36.5 C)-100.8 F (38.2 C)] 98.3 F (36.8 C) (05/26 0400) Pulse Rate:  [36-120] 74 (05/26 0035) Resp:  [16-28] 19 (05/26 0400) BP: (76-118)/(43-97) 97/51 (05/26 0400) SpO2:  [93 %-100 %] 93 % (05/26 0400) Weight:  [79 kg-82.6 kg] 79 kg (05/26 0001) Physical Exam: General: Alert, well appearing, NAD HEENT: Atraumatic, MMM, No sclera icterus CV: RRR, no murmurs, normal S1/S2 Pulm: CTAB, good WOB  on 1L [place], no crackles or wheezing Abd: Soft, no distension, no tenderness Skin: dry, warm Ext: +1 BLE edema.   Laboratory: Recent Labs  Lab 09/12/21 1505 09/13/21 0405  WBC 17.2* 15.8*   HGB 11.6* 9.5*  HCT 36.7* 28.9*  PLT 480* 400   Recent Labs  Lab 09/12/21 1505 09/13/21 0405  NA 141 140  K 3.4* 3.0*  CL 99 102  CO2 31 27  BUN 11 14  CREATININE 0.62 0.66  CALCIUM 8.9 8.5*  PROT 6.3*  --   BILITOT 1.1  --   ALKPHOS 79  --   ALT 23  --   AST 20  --   GLUCOSE 100* 110*     Imaging/Diagnostic Tests: DG Chest 2 View  Result Date: 09/12/2021 CLINICAL DATA:  shortness of breath, cough EXAM: CHEST - 2 VIEW COMPARISON:  Sep 02, 2021 Radiograph. FINDINGS: The heart size and mediastinal contours are within normal limits. Aortic atherosclerosis. Low lung volumes with bibasilar opacities favor atelectasis. No visible pleural effusion or pneumothorax. Anterior cervical fusion hardware. IMPRESSION: Low lung volumes with bibasilar opacities favor atelectasis. Electronically Signed   By: Dahlia Bailiff M.D.   On: 09/12/2021 15:39     Alen Bleacher, MD 09/13/2021, 7:01 AM PGY-1, [place] Intern pager: [phone], text pages welcome

## 2021-09-14 LAB — CBC WITH DIFFERENTIAL/PLATELET
Abs Immature Granulocytes: 0.1 10*3/uL — ABNORMAL HIGH (ref 0.00–0.07)
Basophils Absolute: 0 10*3/uL (ref 0.0–0.1)
Basophils Relative: 0 %
Eosinophils Absolute: 0 10*3/uL (ref 0.0–0.5)
Eosinophils Relative: 0 %
HCT: 30.6 % — ABNORMAL LOW (ref 39.0–52.0)
Hemoglobin: 9.6 g/dL — ABNORMAL LOW (ref 13.0–17.0)
Immature Granulocytes: 1 %
Lymphocytes Relative: 13 %
Lymphs Abs: 1.8 10*3/uL (ref 0.7–4.0)
MCH: 29.4 pg (ref 26.0–34.0)
MCHC: 31.4 g/dL (ref 30.0–36.0)
MCV: 93.9 fL (ref 80.0–100.0)
Monocytes Absolute: 1.2 10*3/uL — ABNORMAL HIGH (ref 0.1–1.0)
Monocytes Relative: 9 %
Neutro Abs: 10.6 10*3/uL — ABNORMAL HIGH (ref 1.7–7.7)
Neutrophils Relative %: 77 %
Platelets: 428 10*3/uL — ABNORMAL HIGH (ref 150–400)
RBC: 3.26 MIL/uL — ABNORMAL LOW (ref 4.22–5.81)
RDW: 15.1 % (ref 11.5–15.5)
WBC: 13.8 10*3/uL — ABNORMAL HIGH (ref 4.0–10.5)
nRBC: 0 % (ref 0.0–0.2)

## 2021-09-14 LAB — BASIC METABOLIC PANEL
Anion gap: 5 (ref 5–15)
BUN: 15 mg/dL (ref 8–23)
CO2: 30 mmol/L (ref 22–32)
Calcium: 9 mg/dL (ref 8.9–10.3)
Chloride: 106 mmol/L (ref 98–111)
Creatinine, Ser: 0.66 mg/dL (ref 0.61–1.24)
GFR, Estimated: >60 mL/min (ref >60)
Glucose, Bld: 91 mg/dL (ref 70–99)
Potassium: 3.2 mmol/L — ABNORMAL LOW (ref 3.5–5.1)
Sodium: 141 mmol/L (ref 135–145)

## 2021-09-14 LAB — GLUCOSE, CAPILLARY: Glucose-Capillary: 136 mg/dL — ABNORMAL HIGH (ref 70–99)

## 2021-09-14 MED ORDER — GUAIFENESIN ER 600 MG PO TB12: 600.0000 mg | ORAL_TABLET | Freq: Two times a day (BID) | ORAL | Status: DC | PRN

## 2021-09-14 MED ORDER — POTASSIUM CHLORIDE CRYS ER 20 MEQ PO TBCR
40.0000 meq | EXTENDED_RELEASE_TABLET | Freq: Once | ORAL | Status: AC
  Administered 2021-09-14: 40 meq via ORAL
  Filled 2021-09-14: qty 2

## 2021-09-14 MED ORDER — ACETAMINOPHEN 325 MG PO TABS: 650.0000 mg | ORAL_TABLET | Freq: Four times a day (QID) | ORAL | Status: DC | PRN

## 2021-09-14 MED ORDER — GERHARDT'S BUTT CREAM
TOPICAL_CREAM | Freq: Every day | CUTANEOUS | Status: DC
  Filled 2021-09-14: qty 1

## 2021-09-14 NOTE — Evaluation (Signed)
Occupational Therapy Evaluation Patient Details Name: Logan [name] MRN: [id number] DOB: 08/18/1945 Today's Date: 09/14/2021   History of Present Illness The pt is a 76 yo male presenting 5/25 with generalized weakness, and productive cough. Undergoing work up for PNA vs new COPD vs CHF exacerbation. PMH includes: afib, HTN, CHF, HLD, DM II, DVT/PE.   Clinical Impression   Patient admitted for the diagnosis above.  PTA he had recently moved in with his son, and admits to needing increasing assist with basic mobility and ADL completion.  Patient no longer driving, and not able to assist much with IADL.  Although his son can assist him, currently he is needing up to Max A of 2 for basic transfers, and up to Max A for lower body ADL at bedlevel.  OT to follow in the acute setting with SNF being recommended for post acute rehab to maximize his functional statu prior to returning home.  Deficits are listed below.        Recommendations for follow up therapy are one component of a multi-disciplinary discharge planning process, led by the attending physician.  Recommendations may be updated based on patient status, additional functional criteria and insurance authorization.   Follow Up Recommendations  Skilled nursing-short term rehab (<3 hours/day)    Assistance Recommended at Discharge Frequent or constant Supervision/Assistance  Patient can return home with the following Two people to help with walking and/or transfers;Help with stairs or ramp for entrance;Assistance with feeding;Assist for transportation;Assistance with cooking/housework;A lot of help with bathing/dressing/bathroom    Functional Status Assessment  Patient has had a recent decline in their functional status and demonstrates the ability to make significant improvements in function in a reasonable and predictable amount of time.  Equipment Recommendations  Wheelchair (measurements OT);Wheelchair cushion (measurements OT)     Recommendations for Other Services       Precautions / Restrictions Precautions Precautions: Fall Precaution Comments: incontinent of BM, watch O2 Restrictions Weight Bearing Restrictions: No      Mobility Bed Mobility Overal bed mobility: Needs Assistance Bed Mobility: Supine to Sit     Supine to sit: Mod assist       Patient Response: Cooperative  Transfers                          Balance Overall balance assessment: Needs assistance Sitting-balance support: Bilateral upper extremity supported, Feet supported Sitting balance-Leahy Scale: Poor   Postural control: Left lateral lean                     High Level Balance Comments: Declined OOB due to difficult evening.           ADL either performed or assessed with clinical judgement   ADL Overall ADL's : Needs assistance/impaired Eating/Feeding: Set up;Bed level   Grooming: Wash/dry hands;Wash/dry face;Set up;Bed level   Upper Body Bathing: Minimal assistance;Bed level   Lower Body Bathing: Maximal assistance;Bed level   Upper Body Dressing : Minimal assistance;Bed level   Lower Body Dressing: Maximal assistance;Bed level       Toileting- Clothing Manipulation and Hygiene: Total assistance;Bed level               Vision Patient Visual Report: No change from baseline       Perception Perception Perception: Not tested   Praxis Praxis Praxis: Not tested    Pertinent Vitals/Pain Pain Assessment Pain Assessment: Faces Faces Pain Scale: Hurts little more Pain Location: generalized  low back Pain Descriptors / Indicators: Aching Pain Intervention(s): Monitored during session     Hand Dominance Right   Extremity/Trunk Assessment Upper Extremity Assessment Upper Extremity Assessment: Generalized weakness RUE Deficits / Details: pt reports he dislocated R shoulder 1 year ago, waiting on repair. limited ROM and functional strength   Lower Extremity Assessment Lower  Extremity Assessment: Defer to PT evaluation   Cervical / Trunk Assessment Cervical / Trunk Assessment: Kyphotic   Communication Communication Communication: Other (comment)   Cognition Arousal/Alertness: Awake/alert Behavior During Therapy: Flat affect Overall Cognitive Status: Impaired/Different from baseline Area of Impairment: Attention, Following commands, Safety/judgement, Awareness, Problem solving                   Current Attention Level: Focused   Following Commands: Follows one step commands with increased time Safety/Judgement: Decreased awareness of safety, Decreased awareness of deficits Awareness: Intellectual Problem Solving: Slow processing, Decreased initiation, Difficulty sequencing, Requires verbal cues       General Comments   VSS on supplemental O2    Exercises     Shoulder Instructions      Home Living Family/patient expects to be discharged to:: Private residence Living Arrangements: Children Available Help at Discharge: Family;Available 24 hours/day Type of Home: House Home Access: Stairs to enter [employer] of Steps: 3-4 Entrance Stairs-Rails: Right Home Layout: Able to live on main level with bedroom/bathroom     Bathroom Shower/Tub: Teacher, early years/pre: Standard Bathroom Accessibility: No   Home Equipment: Conservation officer, nature (2 wheels);Wheelchair - manual;Grab bars - tub/shower;BSC/3in1;Tub bench;Other (comment)   Additional Comments: pt reccently moved in with son and daughter in law and 2yo grandchild      Prior Functioning/Environment Prior Level of Function : Needs assist             Mobility Comments: pt reports he has been independent with RW prior to last few days, then has needed assist from son to complete all transfers and mobility. ADLs Comments: pt reports independent with use of tub bench        OT Problem List: Decreased strength;Decreased range of motion;Decreased activity  tolerance;Impaired balance (sitting and/or standing);Decreased safety awareness;Pain;Increased edema      OT Treatment/Interventions: Self-care/ADL training;Therapeutic exercise;Patient/family education;Balance training;Therapeutic activities;DME and/or AE instruction    OT Goals(Current goals can be found in the care plan section) Acute Rehab OT Goals OT Goal Formulation: Patient unable to participate in goal setting Time For Goal Achievement: 09/28/21 Potential to Achieve Goals: Good ADL Goals Pt Will Perform Grooming: with min guard assist;standing Pt Will Perform Upper Body Dressing: with set-up;sitting Pt Will Perform Lower Body Dressing: with min assist;sit to/from stand Pt Will Transfer to Toilet: with min guard assist;stand pivot transfer;bedside commode Pt/caregiver will Perform Home Exercise Program: Increased strength;With theraband;With Supervision  OT Frequency: Min 2X/week    Co-evaluation              AM-PAC OT "6 Clicks" Daily Activity     Outcome Measure Help from another person eating meals?: None Help from another person taking care of personal grooming?: None Help from another person toileting, which includes using toliet, bedpan, or urinal?: A Lot Help from another person bathing (including washing, rinsing, drying)?: A Lot Help from another person to put on and taking off regular upper body clothing?: A Little Help from another person to put on and taking off regular lower body clothing?: A Lot 6 Click Score: 17   End of Session Equipment Utilized  During Treatment: Oxygen Nurse Communication: Mobility status  Activity Tolerance: Patient limited by pain;Patient limited by fatigue Patient left: in bed;with call bell/phone within reach;with bed alarm set  OT Visit Diagnosis: Unsteadiness on feet (R26.81);Muscle weakness (generalized) (M62.81);Other symptoms and signs involving cognitive function                Time: 1000-1017 OT Time Calculation (min): 17  min Charges:  OT General Charges $OT Visit: 1 Visit OT Evaluation $OT Eval Moderate Complexity: 1 Mod  09/14/2021  RP, OTR/L  Acute Rehabilitation Services  Office:  [phone]   Metta Clines 09/14/2021, 10:39 AM

## 2021-09-14 NOTE — Progress Notes (Signed)
Family Medicine Teaching Service Daily Progress Note Intern Pager: [phone]  Patient name: Logan [name] Medical record number: [id number] Date of birth: Jun 01, 1945 Age: 76 y.o. Gender: male  Primary Care Provider: [name], MD Consultants: None Code Status: Full  Pt Overview and Major Events to Date:  5/25 admitted  Assessment and Plan: Mr. Cumbie is a 76 year old male admitted for acute hypoxic respiratory failure thought to be secondary to COPD exacerbation versus CHF or pneumonia. PMHx significant for HFpEF 55%, HTN, PE, DVT, aspiration pneumonia, A-fib on Eliquis, T2DM, gout, HLD, cervical disc disease.   Acute hypoxic respiratory failure, improving Treating as COPD exacerbation with good improvement. Patient's been afebrile for last 24 hours, SPO2 normal on 2 L O2 via [place].  WBC slightly improved, otherwise CBC and BMP overall unremarkable. - Continue azithromycin, day #2/3 - Continue prednisone 40 mg daily, day #2/5 - Wean O2 as tolerated - Continue DuoNeb as needed, guaifenesin as needed  Anemia  Known history of anemia, hemoglobin today stable.  No interventions at this time.   Hypotension  Blood pressure has improved last 24 hours, now normotensive overall with scattered grade 1 hypertension measurements. - Consider restarting home Lasix 40 mg daily tomorrow - Likely hold off carvedilol given normal pulse - We will order orthostatic vitals to assess standing blood pressure  Right hip and knee pain Suspect osteoarthritis versus compensatory gait from left hip replacement couple months ago.  Declined to restart home Percocet or tramadol at this time given age, comorbidities, and risk of falls. - PT/OT eval and treat, recommending SNF - Lidocaine patch - Up with assistance - Tylenol 650 every 6 as needed  Recent C. difficile One large stool this morning per patient, he is unclear if watery in nature. Continue contact precautions.   HFpEF 55%  HTN Home medication  include carvedilol 12.5 mg twice daily, 640 mg daily and losartan 50 mg daily. -Holding home medications due to hypotension  T2DM Well-controlled.   Positive for COVID on admission Previously had [place] diagnosed 3/25 in SNF.  Positive admission test likely false positive from recent infection.  Hx of PE and DVT  AFIB on Eliquis Continue home full dose Eliquis.  Gout Continue home allopurinol daily.  Stage I sacral pressure ulcer Up with assistance, up to chair every shift, turn patient every 2 hours.   FEN/GI: Regular diet PPx: Full dose Eliquis Dispo:Home with home health  pending clinical improvement .  Subjective:  Seen about in bed.  Productive cough with copious sputum.  Patient says he feels "in bad shape".  Denies pain specifically chest pain or palpitations, shortness of breath.  He says the cough with phlegm is bothering him quite a bit.  Shows me the phlegm he has collected in the kidney bowel at bedside.  Objective: Temp:  [97.7 F (36.5 C)-98.4 F (36.9 C)] 97.7 F (36.5 C) (05/27 0800) Pulse Rate:  [69-81] 69 (05/27 0800) Resp:  [15-30] 21 (05/27 0800) BP: (117-144)/(50-81) 127/60 (05/27 0800) SpO2:  [95 %-99 %] 96 % (05/27 0800) Weight:  [79.3 kg] 79.3 kg (05/27 0300) Physical Exam: General: Awake, alert, no acute distress Cardiovascular: Regular rate and rhythm, no murmur appreciated Respiratory: Lung fields themselves CTAB, referred upper airway congestion sounds  Laboratory: Recent Labs  Lab 09/12/21 1505 09/13/21 0405 09/14/21 0429  WBC 17.2* 15.8* 13.8*  HGB 11.6* 9.5* 9.6*  HCT 36.7* 28.9* 30.6*  PLT 480* 400 428*   Recent Labs  Lab 09/12/21 1505 09/13/21 0405 09/14/21 0429  NA 141 140 141  K 3.4* 3.0* 3.2*  CL 99 102 106  CO2 '31 27 30  '$ BUN '11 14 15  '$ CREATININE 0.62 0.66 0.66  CALCIUM 8.9 8.5* 9.0  PROT 6.3*  --   --   BILITOT 1.1  --   --   ALKPHOS 79  --   --   ALT 23  --   --   AST 20  --   --   GLUCOSE 100* 110* 91     Imaging/Diagnostic Tests: None last 24 hours.  Ezequiel Essex, MD 09/14/2021, 9:26 AM PGY-2, [place] Intern pager: [phone], text pages welcome

## 2021-09-14 NOTE — Progress Notes (Signed)
FPTS Brief Progress Note  S:Patient sleeping comfortably on assessment. When I left, RN paged to report patient desatting to 82%, complaining of dyspnea, tachypnic, subjectively warm, and with bibasilar crackles. VSS: BP 144/70, T 98.2. Furthermore, she reports UOP 200 cc since 7 PM last night.   On reassessment, patient reports feeling short of breath, which has had some relief with coughing productive of greenish sputum.   O: BP 124/81 (BP Location: Left Arm)   Pulse 79   Temp 98.2 F (36.8 C) (Oral)   Resp 15   Ht '5\' 7"'$  (1.702 m)   Wt 79 kg   SpO2 95%   BMI 27.28 kg/m   General: Uncomfortable appearing male with [place] in place Respiratory: Tachypnea, no increased WOB on [place].  CTAB, significant cough with inspiration, but no crackles, wheezes, or rales appreciated.  Sats on exam with deep respiration to 71%.  Recovered to 90% with normal respiratory effort.  A/P: Acute respiratory distress in the setting of possible COPD versus CHF exacerbation No crackles appreciated on exam, CTAB, so no CXR indicated. - Advised RN to give DuoNeb treatment and reassess - Mucinex twice daily as needed for cough  Remainder of management per day team  - Orders reviewed. Labs for AM ordered, which was adjusted as needed.  - If condition changes, plan includes CXR.   Rosezetta Schlatter, MD 09/14/2021, 1:24 AM PGY-1, [facility] Family Medicine Night Resident  Please page [phone] with questions.

## 2021-09-14 NOTE — Progress Notes (Signed)
   09/14/21 0200  Assess: MEWS Score  Temp 98.2 F (36.8 C)  BP (!) 144/70  Pulse Rate 72  ECG Heart Rate 73  Resp (!) 30  SpO2 98 %  O2 Device Nasal Cannula  O2 Flow Rate (L/min) 2 L/min  Assess: MEWS Score  MEWS Temp 0  MEWS Systolic 0  MEWS Pulse 0  MEWS RR 2  MEWS LOC 0  MEWS Score 2  MEWS Score Color Yellow  Assess: if the MEWS score is Yellow or Red  Were vital signs taken at a resting state? Yes  Focused Assessment No change from prior assessment  Early Detection of Sepsis Score *See Row Information* Medium  MEWS guidelines implemented *See Row Information* No, vital signs rechecked  Treat  MEWS Interventions Administered scheduled meds/treatments  Pain Scale 0-10  Take Vital Signs  Increase Vital Sign Frequency  Yellow: Q 2hr X 2 then Q 4hr X 2, if remains yellow, continue Q 4hrs  Notify: Charge Nurse/RN  Name of Charge Nurse/RN Notified Jequetta RN  Date Charge Nurse/RN Notified 09/13/21  Time Charge Nurse/RN Notified 0200  Notify: Provider  Provider Name/Title Dr Earley Favor  Date Provider Notified 09/14/21  Time Provider Notified 0200  Method of Notification Call  Notification Reason Change in status  Provider response At bedside  Date of Provider Response 09/14/21  Time of Provider Response 0203  Document  Patient Outcome Stabilized after interventions  Progress note created (see row info) Yes

## 2021-09-15 DIAGNOSIS — R0902 Hypoxemia: Secondary | ICD-10-CM

## 2021-09-15 DIAGNOSIS — K625 Hemorrhage of anus and rectum: Secondary | ICD-10-CM

## 2021-09-15 LAB — CBC
HCT: 30.6 % — ABNORMAL LOW (ref 39.0–52.0)
HCT: 33 % — ABNORMAL LOW (ref 39.0–52.0)
Hemoglobin: 9.6 g/dL — ABNORMAL LOW (ref 13.0–17.0)
Hemoglobin: 10.4 g/dL — ABNORMAL LOW (ref 13.0–17.0)
MCH: 29.3 pg (ref 26.0–34.0)
MCH: 29.4 pg (ref 26.0–34.0)
MCHC: 31.4 g/dL (ref 30.0–36.0)
MCHC: 31.5 g/dL (ref 30.0–36.0)
MCV: 93.3 fL (ref 80.0–100.0)
MCV: 93.2 fL (ref 80.0–100.0)
Platelets: 402 10*3/uL — ABNORMAL HIGH (ref 150–400)
Platelets: 453 10*3/uL — ABNORMAL HIGH (ref 150–400)
RBC: 3.28 MIL/uL — ABNORMAL LOW (ref 4.22–5.81)
RBC: 3.54 MIL/uL — ABNORMAL LOW (ref 4.22–5.81)
RDW: 15 % (ref 11.5–15.5)
RDW: 14.8 % (ref 11.5–15.5)
WBC: 12.1 10*3/uL — ABNORMAL HIGH (ref 4.0–10.5)
WBC: 9 10*3/uL (ref 4.0–10.5)
nRBC: 0 % (ref 0.0–0.2)
nRBC: 0 % (ref 0.0–0.2)

## 2021-09-15 LAB — BASIC METABOLIC PANEL
Anion gap: 6 (ref 5–15)
BUN: 16 mg/dL (ref 8–23)
CO2: 28 mmol/L (ref 22–32)
Calcium: 9 mg/dL (ref 8.9–10.3)
Chloride: 108 mmol/L (ref 98–111)
Creatinine, Ser: 0.61 mg/dL (ref 0.61–1.24)
GFR, Estimated: >60 mL/min (ref >60)
Glucose, Bld: 93 mg/dL (ref 70–99)
Potassium: 4.2 mmol/L (ref 3.5–5.1)
Sodium: 142 mmol/L (ref 135–145)

## 2021-09-15 NOTE — Progress Notes (Signed)
Nurse tech notified nurse that patient's bowel movement had quite a bit of red blood in it. According to patient he has no history of hemorrhoids. Informed pt that MD will be made aware. Paged MD.  Doctor will be up to evaluate patient shortly.

## 2021-09-15 NOTE — Progress Notes (Signed)
FPTS Brief Progress Note  S: Patient sleeping on RA   O: BP (!) 116/53 (BP Location: Left Arm)   Pulse 76   Temp 98 F (36.7 C) (Oral)   Resp 19   Ht '5\' 7"'$  (1.702 m)   Wt 78.9 kg   SpO2 96%   BMI 27.24 kg/m     A/P: - Orders reviewed. Labs for AM ordered, which was adjusted as needed.  - Patient likely has COPD, goal is to limit unnecessary O2 supplementation as this decreases drive to breathe. Goal SpO2 is 92%.  Gladys Damme, MD 09/15/2021, 2:08 AM PGY-3, [facility] Family Medicine Night Resident  Please page [phone] with questions.

## 2021-09-15 NOTE — Progress Notes (Signed)
Family Medicine Teaching Service Daily Progress Note Intern Pager: [phone]  Patient name: Logan [name] Medical record number: [id number] Date of birth: Feb 25, 1946 Age: 76 y.o. Gender: male  Primary Care Provider: [name], MD Consultants: None Code Status: Full   Pt Overview and Major Events to Date:  5/25: admitted   Assessment and Plan:  Mr. Stein is a 76 year old male admitted for acute hypoxic respiratory failure thought to be secondary to COPD exacerbation versus CHF or pneumonia. PMHx significant for HFpEF 55%, HTN, PE, DVT, aspiration pneumonia, A-fib on Eliquis, T2DM, gout, HLD, cervical disc disease.  Acute hypoxic respiratory failure 97% on 2L on nasal cannula.  Patient was afebrile overnight. WBC 12.1 this morning, downtrending from 13.8.   -Continue prednisone 40 mg  -Wean oxygen as tolerated maintain sats above 88% -Monitor respiratory status  -DuoNebs as needed -Guanfacine as needed -If fevers or has leukocytosis and add back ceftriaxone  Rectal bleeding Pt reports 1 episode of rectal bleeding this morning mixed in with stool. Pt reports a colonoscopy 5 years ago but no report of this on chart review.  -Monitor sx -If persistent sx rectal exam and consult GI   Anemia, chronic, stable Hb 9.6 today, at baseline of 8-9.  Likely anemia of chronic disease.  -Continue to monitor daily CBC  HFpEF Hypertension, chronic, stable  BP 122/66.  Home meds include carvedilol and losartan -Continue to monitor -Continue to hold home antihypertensives given to normotensive blood pressures  Right hip pain Patient asymptomatic this morning -Tylenol 650 every 6 as needed, lidocaine patch -Ongoing PT/OT  Recent C. difficile 2 bouts of diarrhea as above.  -Continue enteric precautions  Type 2 diabetes, chronic, stable CBGs overnight 93, 136 -Continue to monitor   Recent COVID, resolved Patient diagnosed with COVID on 3/25 in SNF  History of PE, DVT A-fib   -Continue Eliquis 5 mg twice daily -monitor new rectal bleeding in setting of eliquis use   Stage I sacral pressure ulcer -Turn patient every 2 hours  FEN/GI: Regular diet, SNF PPx: Eliquis  Dispo:Home with home health  pending clinical improvement .   Subjective:  Pt reports generalized weakness this morning. He reports 2 episodes of diarrhea today, one of which was bloody with bright red blood. Denies further episodes. RN is aware. Overall his breathing has improved.   Objective: Temp:  [97.7 F (36.5 C)-98.4 F (36.9 C)] 97.8 F (36.6 C) (05/28 0400) Pulse Rate:  [64-76] 64 (05/28 0400) Resp:  [18-21] 19 (05/28 0400) BP: (110-127)/(53-76) 122/66 (05/28 0400) SpO2:  [95 %-96 %] 95 % (05/28 0400) Weight:  [78.9 kg] 78.9 kg (05/27 2359)   Physical Exam: General: Alert, no acute distress, frail elderly male  Cardio: Normal S1 and S2, RRR, no r/m/g Pulm: CTAB, normal work of breathing Abdomen: Bowel sounds normal. Abdomen soft and non-tender.  Extremities: 1+ peripheral edema bilaterally Neuro: Cranial nerves grossly intact   Laboratory: Recent Labs  Lab 09/13/21 0405 09/14/21 0429 09/15/21 0305  WBC 15.8* 13.8* 12.1*  HGB 9.5* 9.6* 9.6*  HCT 28.9* 30.6* 30.6*  PLT 400 428* 402*   Recent Labs  Lab 09/12/21 1505 09/13/21 0405 09/14/21 0429 09/15/21 0305  NA 141 140 141 142  K 3.4* 3.0* 3.2* 4.2  CL 99 102 106 108  CO2 '31 27 30 28  '$ BUN '11 14 15 16  '$ CREATININE 0.62 0.66 0.66 0.61  CALCIUM 8.9 8.5* 9.0 9.0  PROT 6.3*  --   --   --   BILITOT  1.1  --   --   --   ALKPHOS 79  --   --   --   ALT 23  --   --   --   AST 20  --   --   --   GLUCOSE 100* 110* 91 93     Imaging/Diagnostic Tests: No results found.   Lattie Haw, MD 09/15/2021, 7:34 AM PGY-3, [place] Intern pager: [phone], text pages welcome

## 2021-09-15 NOTE — Progress Notes (Signed)
Paged MD to let them know patient had more blood in his bowel movement.

## 2021-09-15 NOTE — Progress Notes (Signed)
Per MD take a picture of next bowel movement so it can be in media.

## 2021-09-15 NOTE — Progress Notes (Signed)
Carried out rectal exam after another episode of rectal bleeding. Dr Owens Shark was chaperone present. Some small external hemorrhoids present but none bleeding, no anal fissures, normal rectal tone. No blood on glove. Pt tolerated exam well. Explained to pt that we will hold his Eliquis today given his 2 episodes of bloody stools. He was concerned about the risk of developing a PE/DVT. I explained that he is unlikely to develop a clot if we hold a few doses of Eliquis but he is at risk of bleeding if we continue the Eliquis. Pt agreeable to stopping Eliquis. We will consult GI tomorrow am.   Notified RN to take photo of stools if pt has bloody BM again.  Lattie Haw MD PGY-3, [place] [facility]

## 2021-09-15 NOTE — Progress Notes (Addendum)
FPTS Brief Progress Note  S: On arrival patient was awake and laying comfortably in bed. He reports having bloody BM this evening, about same from earlier in the day. He was concerned if this could be related to his recent C. Diff infections weeks ago. He reports having normal BM, no constipation or diarrhea currently. According to pt his last colonoscopy 4-5 years ago was normal.    O: BP 122/68 (BP Location: Left Arm)   Pulse 68   Temp 98 F (36.7 C) (Oral)   Resp 20   Ht '5\' 7"'$  (1.702 m)   Wt 78.9 kg   SpO2 98%   BMI 27.24 kg/m   General: Alert, well appearing, NAD Pulm: CTAB, good WOB on 2L [place] Abd: Soft, no distension, no tenderness, +BS   A/P: Acute hypoxic respiratory failure - Orders reviewed. Labs for AM ordered, which was adjusted as needed.  -Dyspnea improved, stable on 2 L nasal cannula with reassuring O2 sats -Continue plan per day team.  Hematochezia Patient on Eliquis for hx of DVT/PE reports new onset of hematochezia that started earlier today.  Bowel movement is normal and per chart review was found to have a nonbleeding external hemorrhoid. -GI consult tomorrow -Continue holding Eliquis due to current bleeding. -CBC with a.m. labs  Alen Bleacher, MD 09/15/2021, 10:06 PM PGY-1, [place] Medicine Night Resident  Please page [phone] with questions.

## 2021-09-16 MED ORDER — APIXABAN 5 MG PO TABS
5.0000 mg | ORAL_TABLET | Freq: Two times a day (BID) | ORAL | Status: DC
Start: 2021-09-16 — End: 2021-09-20
  Administered 2021-09-16 – 2021-09-20 (×8): 5 mg via ORAL
  Filled 2021-09-16 (×8): qty 1

## 2021-09-16 MED ORDER — FLUTICASONE PROPIONATE 50 MCG/ACT NA SUSP
2.0000 | Freq: Every day | NASAL | Status: DC
  Administered 2021-09-17 – 2021-09-20 (×4): 2 via NASAL
  Filled 2021-09-16: qty 16

## 2021-09-16 NOTE — Consult Note (Signed)
UNASSIGNED CONSULT FOR  GI  Reason for Consult: Hematochezia Referring Physician: Teaching Service  Logan [name] HPI: This is a 76 year old male with multiple medical problems initial admitted for respiratory issues.  Since his admission his cough and SOB is much better.  Yesterday with a bowel movement he reported having some hematochezia.  Per his description is was "streaks" on the stool.  He had another bowel movement with blood earlier today, but it was not a significant amount.  He has a history of afib and he was on Eliquis until he started to have the hematochezia.  His HGB yesterday AM was at 10.4 g/dL and that was the latest value.  His HGB remains stable.  He denies any issues with abdominal pain, nausea, vomiting, or hematemesis.  The patient relays a history of a prior colonoscopy 4-5 years ago in [place].  He cannot remember the name of the physician or the findings.  Past Medical History:  Diagnosis Date   Anxiety    Atherosclerotic renal artery stenosis, unilateral (HCC) 02/28/2014   Atrial fibrillation (HCC)    Bilateral lower extremity edema    Chronic diastolic heart failure ([place]) 10/21/2019   Diabetes mellitus without complication (HCC)    DVT (deep venous thrombosis) (HCC)    Dysrhythmia    Esophageal reflux    Essential hypertension 10/21/2019   Gout    Hyperlipidemia    Hypertension    Mixed hyperlipidemia 10/21/2019   Morbid obesity ([place])    Obesity (BMI 30-39.9) 08/24/2019   Orthopnea    Osteoarthritis    Persistent atrial fibrillation ([place]) 10/21/2019   Pneumonia    Seasonal allergies    Sleep apnea     Past Surgical History:  Procedure Laterality Date   ABDOMINAL AORTAGRAM N/A 02/28/2014   Procedure: ABDOMINAL Maxcine Ham;  Surgeon: Serafina Mitchell, MD;  [facility] CATH LAB;  Service: Cardiovascular;  Laterality: N/A;   ANTERIOR CERVICAL DECOMP/DISCECTOMY FUSION N/A 01/01/2021   Procedure: ACDF C3-4;  Surgeon: Dawley, Theodoro Doing, DO;  Location: [facility];  Service: Neurosurgery;  Laterality: N/A;   APPENDECTOMY     ATRIAL FIBRILLATION ABLATION N/A 02/29/2020   Procedure: ATRIAL FIBRILLATION ABLATION;  Surgeon: Constance Haw, MD;  Location: [facility] CV LAB;  Service: Cardiovascular;  Laterality: N/A;   CARDIOVERSION N/A 10/18/2020   Procedure: CARDIOVERSION (CATH LAB);  Surgeon: Constance Haw, MD;  Location: [facility] CV LAB;  Service: Cardiovascular;  Laterality: N/A;   CARPAL TUNNEL RELEASE Left    TOTAL HIP ARTHROPLASTY Left 06/03/2021   Procedure: LEFT TOTAL HIP ARTHROPLASTY ANTERIOR APPROACH;  Surgeon: Frederik Pear, MD;  Location: [facility] ORS;  Service: Orthopedics;  Laterality: Left;    Family History  Problem Relation Age of Onset   Heart disease Mother    Cancer Father        throat cancer   Heart disease Brother    Atrial fibrillation Brother     Social History:  reports that he has quit smoking. He has quit using smokeless tobacco.  His smokeless tobacco use included chew. He reports that he does not drink alcohol and does not use drugs.  Allergies: No Known Allergies  Medications: Scheduled:  allopurinol  300 mg Oral Daily   apixaban  5 mg Oral BID   fluticasone  2 spray Each Nare Daily   Gerhardt's butt cream   Topical Daily   predniSONE  40 mg Oral Q breakfast   sodium chloride flush  3 mL Intravenous  Q12H   Continuous:  sodium chloride      No results found for this or any previous visit (from the past 24 hour(s)).   No results found.  ROS:  As stated above in the HPI otherwise negative.  Blood pressure (!) 147/72, pulse 70, temperature 97.6 F (36.4 C), temperature source Oral, resp. rate 20, height '5\' 7"'$  (1.702 m), weight 78.9 kg, SpO2 97 %.    PE: Gen: NAD, Alert and Oriented HEENT:  [place]/AT, EOMI Neck: Supple, no LAD Lungs: CTA Bilaterally CV: RRR without M/G/R ABD: Soft, NTND, +BS Ext: No C/C/E Rectal: Brown stool, no masses, or fissures  Assessment/Plan: 1) Hematochezia. 2)  Anemia. 3) Afib requiring anticoagulation.   The rectal examination was negative for any evidence of blood.  Brown stool was noted on the examination glove.  The blood, after further questioning, was described as streaking.  It is not clear about how much blood he truly passed.  Plan: 1) Restart Eliquis. 2) Monitor HGB. 3) Monitor for any signs or symptoms of GI bleeding. 4) [facility] will assume care in the AM.  Tyna Huertas D 09/16/2021, 7:01 PM

## 2021-09-16 NOTE — Progress Notes (Signed)
FPTS Brief Progress Note  S:Patient was awake and laying comfortably in bed. No complain at this time   O: BP (!) 160/78 (BP Location: Left Arm)   Pulse 76   Temp 98.3 F (36.8 C) (Oral)   Resp 20   Ht '5\' 7"'$  (1.702 m)   Wt 78.9 kg   SpO2 96%   BMI 27.24 kg/m    General: Alert, well appearing, NAD CV: RRR, no murmurs, normal S1/S2 Pulm: CTAB, Good WOB on RA with reassuring O2 sats    A/P: - Orders reviewed. Labs for AM ordered, which was adjusted as needed.  - Follow plan per day team.   Alen Bleacher, MD 09/16/2021, 9:23 PM PGY-1, [place] [facility] Night Resident  Please page [phone] with questions.

## 2021-09-16 NOTE — Progress Notes (Addendum)
Family Medicine Teaching Service Daily Progress Note Intern Pager: [phone]  Patient name: Logan [name] Medical record number: [id number] Date of birth: 18-Jul-1945 Age: 76 y.o. Gender: male  Primary Care Provider: [name], MD Consultants: None Code Status: Full  Pt Overview and Major Events to Date:  5/25-admitted  Assessment and Plan:  Wolters is a 76 year old male admitted for acute hypoxic respiratory failure likely secondary to COPD exacerbation/CHF/pneumonia.  PMH is significant for HFpEF 55%, HTN, PE, DVT, aspiration pneumonia, A-fib on Eliquis, T2DM, gout, HLD, cervical disc disease  Acute hypoxic respiratory failure Patient currently on RA with reassuring O2 sats in mid 90s. He still endorses cough productive of green sputum. Afebrile through the night with WBC improved to 9 this morning. Overall symptom is improved. -Continue prednisone 40 mg -Wean O2 as tolerated with sats goal above 88% -Continue monitoring respiratory status -DuoNeb as needed -Guanfacine as needed  Rectal bleeding  hematochezia Patient overnight reported continued blood in stool.  Reports normal bowel movement.  Chart review recent stool exam showed nonbleeding hemorrhoid.  Last colonoscopy was 4-5 years ago which patient reports to be normal.  Currently on Eliquis for recent PE/DVT.  We will continue holding Eliquis due to GI bleed. -GI consult, appreciate recs -Continue monitoring CBC with morning lab -Monitor symptoms  Anemia, Chronic and stable Hemoglobin this morning improved to 10.4.  Baseline hemoglobin 8-9.  Currently asymptomatic to report new onset hematochezia -Continue monitoring daily CBC  HFpEF  HTN, chronic and stable BP this morning was 130/79.  Home medication include carvedilol and losartan -Continue holding BP medication due to normotensive BP -Continue BP with routine vitals  History of PE/DVT  A-fib Patient's home medication include Eliquis 5 mg twice daily. -Holding  Eliquis due to current GI bleed. -Restart Eliquis when appropriate  Chronic and stable conditions Right hip pain-asymptomatic Recent COVID-diagnosed 3/25 Stage I sacral pressure ulcer-turn patient every 2 hours Type 2 diabetes-diet controlled currently, euglycemic  FEN/GI: Regular diet PPx: Heparin  Dispo:Home with home health  pending clinical improvement . Barriers include clinical status.   Subjective:  Mr. [name] was awake and laying in bed upon arrival.  He said he is doing well and his symptoms are improved.  Still endorses productive cough. No complain at this time   Objective: Temp:  [97.8 F (36.6 C)-98.2 F (36.8 C)] 98 F (36.7 C) (05/28 2000) Pulse Rate:  [64-68] 68 (05/28 2000) Resp:  [19-20] 20 (05/28 2000) BP: (122-135)/(66-72) 122/68 (05/28 2000) SpO2:  [95 %-98 %] 98 % (05/28 2000) Physical Exam: General: Alert, well appearing, NAD CV: RRR, no murmurs, normal S1/S2 Pulm: CTAB, good WOB on RA, no crackles or wheezing Abd: Soft, no distension, no tenderness Ext: No BLE edema,    Laboratory: Recent Labs  Lab 09/14/21 0429 09/15/21 0305 09/15/21 1847  WBC 13.8* 12.1* 9.0  HGB 9.6* 9.6* 10.4*  HCT 30.6* 30.6* 33.0*  PLT 428* 402* 453*   Recent Labs  Lab 09/12/21 1505 09/13/21 0405 09/14/21 0429 09/15/21 0305  NA 141 140 141 142  K 3.4* 3.0* 3.2* 4.2  CL 99 102 106 108  CO2 '31 27 30 28  '$ BUN '11 14 15 16  '$ CREATININE 0.62 0.66 0.66 0.61  CALCIUM 8.9 8.5* 9.0 9.0  PROT 6.3*  --   --   --   BILITOT 1.1  --   --   --   ALKPHOS 79  --   --   --   ALT 23  --   --   --  AST 20  --   --   --   GLUCOSE 100* 110* 91 93     Imaging/Diagnostic Tests: No new imaging  Alen Bleacher, MD 09/16/2021, 12:33 AM PGY-1, [place] Intern pager: [phone], text pages welcome

## 2021-09-17 DIAGNOSIS — K625 Hemorrhage of anus and rectum: Secondary | ICD-10-CM | POA: Diagnosis not present

## 2021-09-17 DIAGNOSIS — Z8619 Personal history of other infectious and parasitic diseases: Secondary | ICD-10-CM | POA: Diagnosis not present

## 2021-09-17 DIAGNOSIS — R195 Other fecal abnormalities: Secondary | ICD-10-CM

## 2021-09-17 LAB — CULTURE, BLOOD (ROUTINE X 2)
Culture: NO GROWTH
Culture: NO GROWTH
Special Requests: ADEQUATE
Special Requests: ADEQUATE

## 2021-09-17 LAB — BASIC METABOLIC PANEL
Anion gap: 8 (ref 5–15)
BUN: 15 mg/dL (ref 8–23)
CO2: 26 mmol/L (ref 22–32)
Calcium: 8.9 mg/dL (ref 8.9–10.3)
Chloride: 103 mmol/L (ref 98–111)
Creatinine, Ser: 0.57 mg/dL — ABNORMAL LOW (ref 0.61–1.24)
GFR, Estimated: >60 mL/min (ref >60)
Glucose, Bld: 80 mg/dL (ref 70–99)
Potassium: 3.6 mmol/L (ref 3.5–5.1)
Sodium: 137 mmol/L (ref 135–145)

## 2021-09-17 LAB — CBC WITH DIFFERENTIAL/PLATELET
Abs Immature Granulocytes: 0.5 10*3/uL — ABNORMAL HIGH (ref 0.00–0.07)
Basophils Absolute: 0.1 10*3/uL (ref 0.0–0.1)
Basophils Relative: 1 %
Eosinophils Absolute: 0 10*3/uL (ref 0.0–0.5)
Eosinophils Relative: 0 %
HCT: 33.3 % — ABNORMAL LOW (ref 39.0–52.0)
Hemoglobin: 10.4 g/dL — ABNORMAL LOW (ref 13.0–17.0)
Immature Granulocytes: 4 %
Lymphocytes Relative: 22 %
Lymphs Abs: 2.7 10*3/uL (ref 0.7–4.0)
MCH: 29 pg (ref 26.0–34.0)
MCHC: 31.2 g/dL (ref 30.0–36.0)
MCV: 92.8 fL (ref 80.0–100.0)
Monocytes Absolute: 1 10*3/uL (ref 0.1–1.0)
Monocytes Relative: 8 %
Neutro Abs: 8 10*3/uL — ABNORMAL HIGH (ref 1.7–7.7)
Neutrophils Relative %: 65 %
Platelets: 456 10*3/uL — ABNORMAL HIGH (ref 150–400)
RBC: 3.59 MIL/uL — ABNORMAL LOW (ref 4.22–5.81)
RDW: 14.8 % (ref 11.5–15.5)
WBC: 12.4 10*3/uL — ABNORMAL HIGH (ref 4.0–10.5)
nRBC: 0.2 % (ref 0.0–0.2)

## 2021-09-17 LAB — C DIFFICILE (CDIFF) QUICK SCRN (NO PCR REFLEX)
C Diff antigen: NEGATIVE
C Diff interpretation: NOT DETECTED
C Diff toxin: NEGATIVE

## 2021-09-17 NOTE — Progress Notes (Signed)
Mobility Specialist Progress Note:   09/17/21 1507  Mobility  Activity Transferred to/from [facility];Transferred from chair to bed  Level of Assistance Minimal assist, patient does 75% or more  Assistive Device Front wheel walker  Distance Ambulated (ft) 8 ft  Activity Response Tolerated well  $Mobility charge 1 Mobility   Pt received in chair willing to participate. Complaints of feeling weak. Ambulated to [facility], able to have BM. Left in bed with call bell in reach and all needs met.   [facility] Ritvik Mczeal Mobility Specialist

## 2021-09-17 NOTE — Progress Notes (Signed)
Physical Therapy Treatment Patient Details Name: Logan [name] MRN: [id number] DOB: June 14, 1945 Today's Date: 09/17/2021   History of Present Illness The pt is a 76 yo male presenting 5/25 with generalized weakness, and productive cough. Undergoing work up for PNA vs new COPD vs CHF exacerbation. PMH includes: afib, HTN, CHF, HLD, DM II, DVT/PE.    PT Comments    Pt is progressing well with therapy. He continues to require Min assist with mobility. He completed 5 repeat Sit to stands for LE strengthening with rolling walker and Min assist. Pt progressed to gait training today and ambulated ~50 ft with Min assist and cues for posture throughout. End of session, pt resting in chair with alarm on and call bell within reach. Will continue to progress with PT as able and continue to recommend ST [facility] at [facility].     Recommendations for follow up therapy are one component of a multi-disciplinary discharge planning process, led by the attending physician.  Recommendations may be updated based on patient status, additional functional criteria and insurance authorization.  Follow Up Recommendations  Skilled nursing-short term rehab (<3 hours/day)     Assistance Recommended at Discharge Frequent or constant Supervision/Assistance  Patient can return home with the following A little help with walking and/or transfers;A lot of help with bathing/dressing/bathroom;Assistance with cooking/housework;Direct supervision/assist for medications management;Assist for transportation;Help with stairs or ramp for entrance   Equipment Recommendations  None recommended by PT    Recommendations for Other Services OT consult     Precautions / Restrictions Precautions Precautions: Fall Precaution Comments: incontinent of BM, watch O2 Restrictions Weight Bearing Restrictions: No     Mobility  Bed Mobility Overal bed mobility: Needs Assistance Bed Mobility: Supine to Sit     Supine to sit: Min assist, HOB  elevated     General bed mobility comments: pt moved from supine to long sitting and able to bring LE's off EOB with min cues to use bed rail. pt able to scoot to place feet on floor with bil UE use, Min assist to keep trunk upright whil scooting.    Transfers Overall transfer level: Needs assistance Equipment used: Rolling walker (2 wheels) Transfers: Sit to/from Stand, Bed to chair/wheelchair/BSC Sit to Stand: Min assist, +2 safety/equipment   Step pivot transfers: Min assist       General transfer comment: Pt transitioned from bed to commode using a rolling walker, pt used B UE to initiate stand, min assist to guide bedside to commode. Pt moved from bedside commode to recliner min assist to stabilize pt and walker.    Ambulation/Gait Ambulation/Gait assistance: Min assist, +2 safety/equipment Gait Distance (Feet): 50 Feet Assistive device: Rolling walker (2 wheels) Gait Pattern/deviations: Step-through pattern, WFL(Within Functional Limits), Trunk flexed, Decreased stride length Gait velocity: decr     General Gait Details: Pt demonstrated consistent step-through gait, more cognizant of maintaining upright position while ambulating with VC's from therapist. overall no LOB, pt has low hip flexion during swing phase of gait but able to clear feet. VSS.   Stairs             Wheelchair Mobility    Modified Rankin (Stroke Patients Only)       Balance Overall balance assessment: Needs assistance Sitting-balance support: Bilateral upper extremity supported Sitting balance-Leahy Scale: Fair Sitting balance - Comments: Pt requires UE support B, unable to don socks at EOB, pt limited by hand dexterity deficits   Standing balance support: Bilateral upper extremity supported, Reliant on assistive  device for balance, During functional activity Standing balance-Leahy Scale: Poor Standing balance comment: Pt needed UE support from rolling walker                             Cognition Arousal/Alertness: Awake/alert Behavior During Therapy: WFL for tasks assessed/performed Overall Cognitive Status: Within Functional Limits for tasks assessed                     Current Attention Level: Divided   Following Commands: Follows one step commands consistently, Follows multi-step commands consistently Safety/Judgement: Decreased awareness of safety Awareness: Anticipatory Problem Solving: Requires verbal cues General Comments: overall pt WNL, was eager to stand before therapist was ready, verbal cues needed for safety        Exercises Other Exercises Other Exercises: 5 Sit to stands from recliner, rest break between reps due to LE strength fatigue; min assist for steady and safety    General Comments General comments (skin integrity, edema, etc.): Pt reported skin sensory discomfort in lower buttocks and posterior thigh area during cleaning, post defecating      Pertinent Vitals/Pain Pain Assessment Pain Assessment: No/denies pain    Home Living                          Prior Function            PT Goals (current goals can now be found in the care plan section) Acute Rehab PT Goals PT Goal Formulation: With patient Time For Goal Achievement: 09/27/21 Potential to Achieve Goals: Good Progress towards PT goals: Progressing toward goals    Frequency    Min 3X/week      PT Plan Current plan remains appropriate    Co-evaluation              AM-PAC PT "6 Clicks" Mobility   Outcome Measure  Help needed turning from your back to your side while in a flat bed without using bedrails?: A Little Help needed moving from lying on your back to sitting on the side of a flat bed without using bedrails?: A Little Help needed moving to and from a bed to a chair (including a wheelchair)?: A Little Help needed standing up from a chair using your arms (e.g., wheelchair or bedside chair)?: A Little Help needed to walk in  hospital room?: A Little Help needed climbing 3-5 steps with a railing? : A Lot 6 Click Score: 17    End of Session Equipment Utilized During Treatment: Gait belt Activity Tolerance: Patient tolerated treatment well Patient left: in chair;with call bell/phone within reach;with chair alarm set Nurse Communication: Mobility status PT Visit Diagnosis: Other abnormalities of gait and mobility (R26.89);Muscle weakness (generalized) (M62.81)     Time: 4665-9935 PT Time Calculation (min) (ACUTE ONLY): 32 min  Charges:  $Gait Training: 8-22 mins $Therapeutic Activity: 8-22 mins                     Margie Ege, SPT [place] 09/17/2021, 11:45 AM

## 2021-09-17 NOTE — Progress Notes (Signed)
          Daily Rounding Note  09/17/2021, 2:59 PM  LOS: 4 days   SUBJECTIVE:   Chief complaint:   scant bloody stool.      Denies abd pain, nausea.  Stool today was brown but no visible blood.  Describes watery, frequent diarrhea and dx/tretment of C diff and SNF after 06/2021 admission w Covid 19.  Watery stool improved but did not resolve and having 5 or so BM's daily prior to this admission.  No abdominal pain and appetitie is good.    OBJECTIVE:         Vital signs in last 24 hours:    Temp:  [98 F (36.7 C)-98.3 F (36.8 C)] 98 F (36.7 C) (05/30 1113) Pulse Rate:  [65-79] 79 (05/30 1113) Resp:  [18-20] 18 (05/29 2146) BP: (137-160)/(59-82) 142/59 (05/30 1113) SpO2:  [96 %-100 %] 98 % (05/30 1113) Weight:  [77.1 kg] 77.1 kg (05/30 0400) Last BM Date : 09/17/21 Filed Weights   09/14/21 2359 09/16/21 0520 09/17/21 0400  Weight: 78.9 kg 78.9 kg 77.1 kg   General: looks chronically unwell.  Calm, comfortable, pleasant   Heart: RRR Chest: + mucoid cough. Abdomen: NT, soft.    Extremities: no CCE Neuro/Psych:  alert. Oriented x 3.    Intake/Output from previous day: 05/29 0701 - 05/30 0700 In: 1203 [P.O.:1200; I.V.:3] Out: 1300 [Urine:1300]  Intake/Output this shift: No intake/output data recorded.  Lab Results: Recent Labs    09/15/21 0305 09/15/21 1847 09/17/21 0328  WBC 12.1* 9.0 12.4*  HGB 9.6* 10.4* 10.4*  HCT 30.6* 33.0* 33.3*  PLT 402* 453* 456*   BMET Recent Labs    09/15/21 0305 09/17/21 0328  NA 142 137  K 4.2 3.6  CL 108 103  CO2 28 26  GLUCOSE 93 80  BUN 16 15  CREATININE 0.61 0.57*  CALCIUM 9.0 8.9   LFT No results for input(s): PROT, ALBUMIN, AST, ALT, ALKPHOS, BILITOT, BILIDIR, IBILI in the last 72 hours. PT/INR No results for input(s): LABPROT, INR in the last 72 hours. Hepatitis Panel No results for input(s): HEPBSAG, HCVAB, HEPAIGM, HEPBIGM in the last 72  hours.  Studies/Results: No results found.  ASSESMENT:     Minor volume blood w stool.  Resolved.  Prior colonoscopy in [place] ~ 5 yrs ago (per pt), records not in [place].   Had c diff during SNF stay in march/April 2023, not clear how treated, loose stools persist at home.      Chronic Eliquis for A fib, hx DVT/PE: PE in 05/2021.  Eliquis briefly held but resumed yesterday.      Chronic [place] anemia.  Current Hgb 9.6 .. 10.4.  no transfusion to date.  Was ~ 9 in Feb 2023.      Acute hypoxic resp failure.     PLAN     Pt already on enteric precautions but will order Stool C diff testing.  No plans as of now for colonoscopy/flex sig/anoscopy.      Azucena Freed  09/17/2021, 2:59 PM Phone [phone]

## 2021-09-17 NOTE — Progress Notes (Signed)
Family Medicine Teaching Service Daily Progress Note Intern Pager: [phone]  Patient name: Mccauley Diehl Medical record number: [id number] Date of birth: 01-15-1946 Age: 76 y.o. Gender: male  Primary Care Provider: [name], MD Consultants: GI Code Status: Full  Pt Overview and Major Events to Date:  Jaron Czarnecki is a 76 year old male admitted for acute hypoxic respiratory failure likely multifactorial 2/2 COPD exacerbation/CHF/pneumonia.  Past medical history significant for HFpEF, HTN, PE, DVT, aspiration pneumonia, A-fib on Eliquis, T2DM, gout, HLD, and cervical disc disease.   Assessment and Plan:  Acute hypoxic respiratory failure Patient on room air with O2 sats 96 to 100%.  VSS, but WBC increased to 12.4. - Continue prednisone 40 mg - Continue to wean O2 as tolerated with O2 sats goal 88 to 92%. - DuoNebs as needed - Guanfacine as needed  Rectal bleeding  hematochezia GI on board, no blood noted on rectal exam 5/29.  Patient reports normal bowel movements.  Last colonoscopy 4 to 5 years ago WNL.  Per GI, restarted Eliquis.  Hgb maintained at 10.4 after restarting. -Continue to monitor with daily CBCs - Continue to monitor symptoms  Normocytic anemia Chronic and stable.  Hgb maintained at 10.4 with baseline 8-9.  Remains asymptomatic. - Continue to monitor on daily CBC  HFpEF  HTN MR BP 137/82.  24-hour BP 137-160/72-82 - Continue to hold home BP medications as patient normotensive  History of PE/DVT  atrial fibrillation - Continue home Eliquis 5 mg twice daily  Chronic, stable conditions Right hip pain-asymptomatic Recent COVID-diagnosed 3/25 Stage I sacral pressure ulcer-turn patient every 2 hours T2DM-diet controlled; has remained euglycemic throughout admission  FEN/GI: Regular PPx: SQ heparin Dispo:Home with home health versus SNF, pending family's decision. Barriers include decision  Subjective:  Patient sitting in bedside chair, eating lunch.   He reports that he had a good day walking with PT and has been sitting up since.  However, now he feels a bit short of breath.  He reports no other acute concerns or complaints today.    Objective: Temp:  [97.6 F (36.4 C)-98.3 F (36.8 C)] 98.2 F (36.8 C) (05/30 0400) Pulse Rate:  [65-76] 65 (05/30 0400) Resp:  [18-20] 18 (05/29 2146) BP: (137-160)/(72-82) 137/82 (05/30 0400) SpO2:  [96 %-100 %] 97 % (05/30 0400) Weight:  [77.1 kg] 77.1 kg (05/30 0400) Physical Exam: General: Alert, well appearing, NAD Cardiovascular: RRR Respiratory: Rhonchorous breathing and intermittent wheezes appreciated, R>L bases.  Good WOB on room air Abdomen: Soft, NT/ND Extremities: Normal gait and station with walker  Laboratory: Recent Labs  Lab 09/15/21 0305 09/15/21 1847 09/17/21 0328  WBC 12.1* 9.0 12.4*  HGB 9.6* 10.4* 10.4*  HCT 30.6* 33.0* 33.3*  PLT 402* 453* 456*   Recent Labs  Lab 09/12/21 1505 09/13/21 0405 09/14/21 0429 09/15/21 0305 09/17/21 0328  NA 141   < > 141 142 137  K 3.4*   < > 3.2* 4.2 3.6  CL 99   < > 106 108 103  CO2 31   < > '30 28 26  '$ BUN 11   < > '15 16 15  '$ CREATININE 0.62   < > 0.66 0.61 0.57*  CALCIUM 8.9   < > 9.0 9.0 8.9  PROT 6.3*  --   --   --   --   BILITOT 1.1  --   --   --   --   ALKPHOS 79  --   --   --   --  ALT 23  --   --   --   --   AST 20  --   --   --   --   GLUCOSE 100*   < > 91 93 80   < > = values in this interval not displayed.     Imaging/Diagnostic Tests: No new images to review  Rosezetta Schlatter, MD 09/17/2021, 7:45 AM PGY-1, [place] Intern pager: [phone], text pages welcome

## 2021-09-18 ENCOUNTER — Encounter: Payer: Medicare HMO | Admitting: Physical Medicine and Rehabilitation

## 2021-09-18 DIAGNOSIS — K625 Hemorrhage of anus and rectum: Secondary | ICD-10-CM | POA: Diagnosis not present

## 2021-09-18 DIAGNOSIS — R195 Other fecal abnormalities: Secondary | ICD-10-CM | POA: Diagnosis not present

## 2021-09-18 LAB — CBC
HCT: 34.8 % — ABNORMAL LOW (ref 39.0–52.0)
Hemoglobin: 11.1 g/dL — ABNORMAL LOW (ref 13.0–17.0)
MCH: 29.8 pg (ref 26.0–34.0)
MCHC: 31.9 g/dL (ref 30.0–36.0)
MCV: 93.5 fL (ref 80.0–100.0)
Platelets: 512 10*3/uL — ABNORMAL HIGH (ref 150–400)
RBC: 3.72 MIL/uL — ABNORMAL LOW (ref 4.22–5.81)
RDW: 15 % (ref 11.5–15.5)
WBC: 13.2 10*3/uL — ABNORMAL HIGH (ref 4.0–10.5)
nRBC: 0.3 % — ABNORMAL HIGH (ref 0.0–0.2)

## 2021-09-18 MED ORDER — FUROSEMIDE 40 MG PO TABS
40.0000 mg | ORAL_TABLET | Freq: Every day | ORAL | Status: DC
  Administered 2021-09-18 – 2021-09-20 (×3): 40 mg via ORAL
  Filled 2021-09-18 (×3): qty 1

## 2021-09-18 MED ORDER — LOPERAMIDE HCL 2 MG PO CAPS
2.0000 mg | ORAL_CAPSULE | ORAL | Status: DC | PRN
  Administered 2021-09-18: 2 mg via ORAL
  Filled 2021-09-18: qty 1

## 2021-09-18 MED ORDER — CARVEDILOL 12.5 MG PO TABS
12.5000 mg | ORAL_TABLET | Freq: Two times a day (BID) | ORAL | Status: DC
  Administered 2021-09-18 – 2021-09-20 (×5): 12.5 mg via ORAL
  Filled 2021-09-18 (×5): qty 1

## 2021-09-18 MED ORDER — SACCHAROMYCES BOULARDII 250 MG PO CAPS
250.0000 mg | ORAL_CAPSULE | Freq: Two times a day (BID) | ORAL | Status: DC
  Administered 2021-09-18 – 2021-09-20 (×5): 250 mg via ORAL
  Filled 2021-09-18 (×5): qty 1

## 2021-09-18 MED ORDER — LOPERAMIDE HCL 2 MG PO CAPS
2.0000 mg | ORAL_CAPSULE | Freq: Every day | ORAL | Status: AC
  Administered 2021-09-18 – 2021-09-20 (×3): 2 mg via ORAL
  Filled 2021-09-18 (×3): qty 1

## 2021-09-18 NOTE — Progress Notes (Signed)
FPTS Brief Note Reviewed patient's vitals, recent notes.  Vitals:   09/17/21 1113 09/17/21 2009  BP: (!) 142/59 (!) 149/75  Pulse: 79 79  Resp:  (!) 21  Temp: 98 F (36.7 C) 98.1 F (36.7 C)  SpO2: 98% 98%   At this time, no change in plan from day progress note.  Alen Bleacher, MD Page [phone] with questions about this patient.

## 2021-09-18 NOTE — Progress Notes (Signed)
Family Medicine Teaching Service Daily Progress Note Intern Pager: [phone]  Patient name: Logan [name] Medical record number: [id number] Date of birth: 1946-01-09 Age: 76 y.o. Gender: male  Primary Care Provider: [name], MD Consultants: GI Code Status: Full  Pt Overview and Major Events to Date:  Logan [name] is a 76 year old male admitted for acute hypoxic respiratory failure likely multifactorial 2/2 COPD exacerbation/CHF/pneumonia.  PMH significant for HFpEF, HTN, PE, DVT, aspiration pneumonia, A-fib on Eliquis, T2DM, gout, HLD, cervical disc disease.  Assessment and Plan:  Acute hypoxic respiratory failure Patient remains on room air with O2 sats 97 to 98%.  VSS. WBC 13.2. - Continue prednisone 40 mg - O2 sats goal 88 to 92% - DuoNebs as needed - Guanfacine as needed  Rectal bleeding  hematochezia GI on board, no blood noted on rectal exam 5/29.  Patient reports normal bowel movements.  Per documentation, past 24 hours with type VI, brown stools; no type VII stools, no blood observed in stool.  C. difficile negative.  GI following, appreciate recs. -Per GI, continue Eliquis -Initiate loperamide, as C. difficile negative -Florastor 50 mg twice daily  Normocytic anemia Chronic and stable.  Hgb 11.1, baseline 8-9.  Remains asymptomatic. - Continue to monitor on daily CBC  HFpEF  HTN 24 BP: 140-155/59-85, most recent 155/85.  Home medications: Carvedilol 12.5 mg twice daily and Lasix 40 mg daily - Resume home antihypertensives  History of PE/DVT  atrial fibrillation - Continue home Eliquis 5 mg twice daily  Chronic, stable conditions Right hip pain-asymptomatic Recent COVID-diagnosed 3/25 Stage I sacral pressure ulcer-turn patient every 2 hours T2DM-diet controlled; has remained euglycemic throughout admission   FEN/GI: Regular PPx: SQ heparin Dispo:SNF  pending family's decision . Barriers include discussion with family.   Subjective:  Patient sitting  up in bedside chair.  He reports that he feels generally weak particularly after working with PT, but reports improvements to his breathing.  He denies acute concerns or complaints today.  Objective: Temp:  [97.9 F (36.6 C)-98.1 F (36.7 C)] 97.9 F (36.6 C) (05/31 0338) Pulse Rate:  [70-79] 70 (05/31 0338) Resp:  [19-21] 19 (05/31 0338) BP: (142-155)/(59-85) 155/85 (05/31 0338) SpO2:  [97 %-98 %] 97 % (05/31 0338) Weight:  [77 kg] 77 kg (05/31 0654) Physical Exam: General: Alert, well-appearing, NAD Cardiovascular: RRR Respiratory: Crackles appreciated at right lung base, CTA in left lung.  Good WOB on room air Abdomen: Soft, NT/ND Extremities: Nonedematous bilateral lower extremities  Laboratory: Recent Labs  Lab 09/15/21 0305 09/15/21 1847 09/17/21 0328  WBC 12.1* 9.0 12.4*  HGB 9.6* 10.4* 10.4*  HCT 30.6* 33.0* 33.3*  PLT 402* 453* 456*   Recent Labs  Lab 09/12/21 1505 09/13/21 0405 09/14/21 0429 09/15/21 0305 09/17/21 0328  NA 141   < > 141 142 137  K 3.4*   < > 3.2* 4.2 3.6  CL 99   < > 106 108 103  CO2 31   < > '30 28 26  '$ BUN 11   < > '15 16 15  '$ CREATININE 0.62   < > 0.66 0.61 0.57*  CALCIUM 8.9   < > 9.0 9.0 8.9  PROT 6.3*  --   --   --   --   BILITOT 1.1  --   --   --   --   ALKPHOS 79  --   --   --   --   ALT 23  --   --   --   --  AST 20  --   --   --   --   GLUCOSE 100*   < > 91 93 80   < > = values in this interval not displayed.   C Difficile Quick Screen (NO PCR Reflex) Order: [id number] Status: Final result    Visible to patient: No (scheduled for 09/17/2021  9:47 PM)    Next appt: None    Specimen Information: STOOL  0 Result Notes     Component Ref Range & Units 1 d ago  C Diff antigen NEGATIVE NEGATIVE   C Diff toxin NEGATIVE NEGATIVE   C Diff interpretation  No C. difficile detected.   Comment: Performed at [facility], [place] [address], [place], [place] [address]      Imaging/Diagnostic Tests: No new images to  review  Rosezetta Schlatter, MD 09/18/2021, 7:00 AM PGY-1, [place] Intern pager: [phone], text pages welcome

## 2021-09-18 NOTE — Progress Notes (Signed)
Physical Therapy Treatment Patient Details Name: Logan [name] MRN: [id number] DOB: 05/18/1945 Today's Date: 09/18/2021   History of Present Illness The pt is a 76 yo male presenting 5/25 with generalized weakness, and productive cough. Undergoing work up for PNA vs new COPD vs CHF exacerbation. PMH includes: afib, HTN, CHF, HLD, DM II, DVT/PE.    PT Comments    The pt was able to make great progress with both endurance and activity tolerance this morning, progressing hallway ambulation distance from 60 to 140 ft with continued use of RW, and then completing 1 min of sit-stands after 3 min seated rest. The pt only completed 6 sit-stands in 1 min and is dependent on minA and use of BUE support and momentum, which demos continued deficits in LE strength and power. Will continue to benefit from skilled PT acutely and after d/c to facilitate return to prior level of independence with mobility.   60 Second Sit-Stand: The patient completed 6 sit-stands in a 60 second period. ( < 26 for individuals aged 4 - 12 indicates below average activity tolerance and increased risk of falls)     Recommendations for follow up therapy are one component of a multi-disciplinary discharge planning process, led by the attending physician.  Recommendations may be updated based on patient status, additional functional criteria and insurance authorization.  Follow Up Recommendations  Skilled nursing-short term rehab (<3 hours/day)     Assistance Recommended at Discharge Frequent or constant Supervision/Assistance  Patient can return home with the following A little help with walking and/or transfers;A lot of help with bathing/dressing/bathroom;Assistance with cooking/housework;Direct supervision/assist for medications management;Assist for transportation;Help with stairs or ramp for entrance   Equipment Recommendations  None recommended by PT    Recommendations for Other Services       Precautions / Restrictions  Precautions Precautions: Fall Precaution Comments: incontinent of BM, watch O2 Restrictions Weight Bearing Restrictions: No     Mobility  Bed Mobility Overal bed mobility: Needs Assistance             General bed mobility comments: pt sitting EOB upon arrival and in recliner at end of session    Transfers Overall transfer level: Needs assistance Equipment used: Rolling walker (2 wheels) Transfers: Sit to/from Stand, Bed to chair/wheelchair/BSC Sit to Stand: Min assist           General transfer comment: minA to power up to standing with pt using momentum. pt neeidng increased time to rise, completed x6 sit-stands in 1 min.    Ambulation/Gait Ambulation/Gait assistance: Min assist Gait Distance (Feet): 140 Feet Assistive device: Rolling walker (2 wheels) Gait Pattern/deviations: Trunk flexed, Decreased stride length, Step-to pattern Gait velocity: decr     General Gait Details: small step-to gait with RLE not passing LLE. trunk flexed but no overt LOB, pt states this feels like his baseline mobility     Balance Overall balance assessment: Needs assistance Sitting-balance support: Bilateral upper extremity supported Sitting balance-Leahy Scale: Fair Sitting balance - Comments: static sitting without UE support   Standing balance support: Bilateral upper extremity supported, Reliant on assistive device for balance, During functional activity Standing balance-Leahy Scale: Poor Standing balance comment: Pt needed UE support from rolling walker                            Cognition Arousal/Alertness: Awake/alert Behavior During Therapy: WFL for tasks assessed/performed Overall Cognitive Status: Within Functional Limits for tasks assessed Area of Impairment:  Attention, Following commands, Safety/judgement, Awareness, Problem solving                   Current Attention Level: Divided   Following Commands: Follows one step commands consistently,  Follows multi-step commands consistently Safety/Judgement: Decreased awareness of safety Awareness: Anticipatory Problem Solving: Requires verbal cues General Comments: pt following all cues well, less delay or need for repeated cues today        Exercises Other Exercises Other Exercises: 1 min sit-stands: pt completed x6 with use of BUE support and minA. used momentum. Other Exercises: standing HS curls 2 x 10 each leg    General Comments General comments (skin integrity, edema, etc.): VSS on RA with all mobility      Pertinent Vitals/Pain Pain Assessment Pain Assessment: No/denies pain Pain Intervention(s): Monitored during session     PT Goals (current goals can now be found in the care plan section) Acute Rehab PT Goals Patient Stated Goal: none stated, return home PT Goal Formulation: With patient Time For Goal Achievement: 09/27/21 Potential to Achieve Goals: Good Progress towards PT goals: Progressing toward goals    Frequency    Min 3X/week      PT Plan Current plan remains appropriate       AM-PAC PT "6 Clicks" Mobility   Outcome Measure  Help needed turning from your back to your side while in a flat bed without using bedrails?: A Little Help needed moving from lying on your back to sitting on the side of a flat bed without using bedrails?: A Little Help needed moving to and from a bed to a chair (including a wheelchair)?: A Little Help needed standing up from a chair using your arms (e.g., wheelchair or bedside chair)?: A Little Help needed to walk in hospital room?: A Little Help needed climbing 3-5 steps with a railing? : A Lot 6 Click Score: 17    End of Session Equipment Utilized During Treatment: Gait belt Activity Tolerance: Patient tolerated treatment well Patient left: in chair;with call bell/phone within reach;with chair alarm set Nurse Communication: Mobility status PT Visit Diagnosis: Other abnormalities of gait and mobility  (R26.89);Muscle weakness (generalized) (M62.81)     Time: 6812-7517 PT Time Calculation (min) (ACUTE ONLY): 25 min  Charges:  $Therapeutic Exercise: 23-37 mins                     West Carbo, PT, DPT   Acute Rehabilitation Department Pager #: [phone]   Sandra Cockayne 09/18/2021, 8:36 AM

## 2021-09-18 NOTE — Progress Notes (Signed)
Occupational Therapy Treatment Patient Details Name: Logan [name] MRN: [id number] DOB: [birth year] Today's Date: 09/18/2021   History of present illness The pt is a 76 yo male presenting 5/25 with generalized weakness, and productive cough. Undergoing work up for PNA vs new COPD vs CHF exacerbation. PMH includes: afib, HTN, CHF, HLD, DM II, DVT/PE.   OT comments  Patient with fair progress toward patient focused goals.  Needing less assist with lower body ADL, Mod A sit/stand level, and is up and walking much better.  Able to ambulate halls with mobility team earlier.  Patient was sitting up since early am, and had recently returned to bed.  OT will continue to follow, but SNF for short term rehab prior to returning home is recommended.     Recommendations for follow up therapy are one component of a multi-disciplinary discharge planning process, led by the attending physician.  Recommendations may be updated based on patient status, additional functional criteria and insurance authorization.    Follow Up Recommendations  Skilled nursing-short term rehab (<3 hours/day)    Assistance Recommended at Discharge Frequent or constant Supervision/Assistance  Patient can return home with the following  Two people to help with walking and/or transfers;Help with stairs or ramp for entrance;Assistance with feeding;Assist for transportation;Assistance with cooking/housework;A lot of help with bathing/dressing/bathroom   Equipment Recommendations  None recommended by OT    Recommendations for Other Services      Precautions / Restrictions Precautions Precautions: Fall Restrictions Weight Bearing Restrictions: No       Mobility Bed Mobility Overal bed mobility: Needs Assistance Bed Mobility: Supine to Sit, Sit to Supine     Supine to sit: Min assist Sit to supine: Min assist        Transfers                         Balance Overall balance assessment: Needs  assistance Sitting-balance support: Feet supported Sitting balance-Leahy Scale: Fair                                     ADL either performed or assessed with clinical judgement   ADL       Grooming: Wash/dry hands;Wash/dry face;Set up;Sitting           Upper Body Dressing : Set up;Sitting   Lower Body Dressing: Sit to/from stand;Moderate assistance               Functional mobility during ADLs: Cueing for safety;Cueing for sequencing;Rolling walker (2 wheels);Minimal assistance      Extremity/Trunk Assessment Upper Extremity Assessment RUE Deficits / Details: pt reports he dislocated R shoulder 1 year ago, waiting on repair. limited ROM and functional strength       Cervical / Trunk Assessment Cervical / Trunk Assessment: Kyphotic    Vision       Perception     Praxis      Cognition Arousal/Alertness: Awake/alert Behavior During Therapy: WFL for tasks assessed/performed Overall Cognitive Status: Within Functional Limits for tasks assessed                                                       General Comments  BP 157/68    Pertinent Vitals/  Pain       Pain Assessment Pain Assessment: No/denies pain Pain Intervention(s): Monitored during session                                                          Frequency  Min 2X/week        Progress Toward Goals  OT Goals(current goals can now be found in the care plan section)  Progress towards OT goals: Progressing toward goals  Acute Rehab OT Goals OT Goal Formulation: With patient Time For Goal Achievement: 09/28/21 Potential to Achieve Goals: Good  Plan Discharge plan remains appropriate    Co-evaluation                 AM-PAC OT "6 Clicks" Daily Activity     Outcome Measure   Help from another person eating meals?: None Help from another person taking care of personal grooming?: None Help from another person  toileting, which includes using toliet, bedpan, or urinal?: A Little Help from another person bathing (including washing, rinsing, drying)?: A Lot Help from another person to put on and taking off regular upper body clothing?: A Little Help from another person to put on and taking off regular lower body clothing?: A Lot 6 Click Score: 18    End of Session    OT Visit Diagnosis: Unsteadiness on feet (R26.81);Muscle weakness (generalized) (M62.81);Other symptoms and signs involving cognitive function   Activity Tolerance Patient tolerated treatment well   Patient Left in bed;with call bell/phone within reach;with bed alarm set   Nurse Communication Other (comment) (purewick emptied of 1277m)        Time: 18101-7510OT Time Calculation (min): 15 min  Charges: OT General Charges $OT Visit: 1 Visit OT Treatments $Self Care/Home Management : 8-22 mins  09/18/2021  RP, OTR/L  Acute Rehabilitation Services  Office:  [phone]  RMetta Clines5/31/2023, 2:21 PM

## 2021-09-18 NOTE — Care Management Important Message (Signed)
Important Message  Patient Details  Name: Logan [name] MRN: [id number] Date of Birth: 01-03-1946   Medicare Important Message Given:  Yes     Shelda Altes 09/18/2021, 8:42 AM

## 2021-09-18 NOTE — TOC Progression Note (Addendum)
Transition of Care [facility]) - Progression Note    Patient Details  Name: Logan [name] MRN: [id number] Date of Birth: 09-11-1945  Transition of Care [facility]) CM/SW [place], [place] Phone Number: 09/18/2021, 2:02 PM  Clinical Narrative:     CSW is informed by RN that pt interested in SNF now. CSW met with pt who expresses that he is undecided on what to do. He states if he doesn't go to SNF he would stay at his son's house in [place] temporarily. He requests CSW contact pt's son for decision. CSW called son who is agreeable to SNF workup. Pt has been to [facility] before in the past and they would be interested in a [facility] facility at either location. CSW completed fl2 and faxed SNF bed requests in hub.   1400: CSW called pt's son and provided bed offer. CSW explained that [facility] was unable to offer a bed. Only facility offer is [facility] in [place] though son was somewhat concerned that they were 1 star. Son states that he is agreeable to whatever pt chooses, stating, "If he wants to try the facility we can do that and if he wants to come home he can come home here." CSW met with pt and explained bed offer and his son's opinion. Pt is undecided. CSW called pt son on speaker phone in pt room and had them speak about the decision. Pt is agreeable to SNF at [facility] center.   CSW contacted [facility] and confirmed bed availability; they will start insurance auth.   Expected Discharge Plan: Skilled Nursing Facility Barriers to Discharge: Insurance Authorization  Expected Discharge Plan and Services Expected Discharge Plan: [place]   Discharge Planning Services: CM Consult   Living arrangements for the past 2 months: Single Family Home                                       Social Determinants of Health (SDOH) Interventions    Readmission Risk Interventions    01/03/2021    2:46 PM  Readmission Risk Prevention Plan  Post  Dischage Appt Not Complete  Appt Comments going to INPT rehab  Medication Screening Complete  Transportation Screening Complete

## 2021-09-18 NOTE — Progress Notes (Signed)
          Daily Rounding Note  09/18/2021, 11:20 AM  LOS: 5 days   SUBJECTIVE:   Chief complaint: Runny stools.     Had about 5 "runny" stools yesterday.  No abdominal pain or nausea.  2 similar stools this morning.  No blood seen with any of these.  OBJECTIVE:         Vital signs in last 24 hours:    Temp:  [97.9 F (36.6 C)-98.1 F (36.7 C)] 97.9 F (36.6 C) (05/31 0338) Pulse Rate:  [70-79] 70 (05/31 0338) Resp:  [19-21] 19 (05/31 0338) BP: (149-155)/(75-85) 155/85 (05/31 0338) SpO2:  [97 %-98 %] 97 % (05/31 0338) Weight:  [77 kg] 77 kg (05/31 0654) Last BM Date : 09/17/21 Filed Weights   09/16/21 0520 09/17/21 0400 09/18/21 0654  Weight: 78.9 kg 77.1 kg 77 kg   General: Looks somewhat chronically ill but not toxic. Heart: RRR. Chest: Fine crackles in the right base.  No labored breathing or cough Abdomen: Soft, no distention.  Active bowel sounds. Extremities: No CCE. Neuro/Psych: Oriented x3.  Intake/Output from previous day: 05/30 0701 - 05/31 0700 In: 600 [P.O.:600] Out: 600 [Urine:600]  Intake/Output this shift: No intake/output data recorded.  Lab Results: Recent Labs    09/15/21 1847 09/17/21 0328  WBC 9.0 12.4*  HGB 10.4* 10.4*  HCT 33.0* 33.3*  PLT 453* 456*   BMET Recent Labs    09/17/21 0328  NA 137  K 3.6  CL 103  CO2 26  GLUCOSE 80  BUN 15  CREATININE 0.57*  CALCIUM 8.9   LFT No results for input(s): PROT, ALBUMIN, AST, ALT, ALKPHOS, BILITOT, BILIDIR, IBILI in the last 72 hours. PT/INR No results for input(s): LABPROT, INR in the last 72 hours. Hepatitis Panel No results for input(s): HEPBSAG, HCVAB, HEPAIGM, HEPBIGM in the last 72 hours.  Studies/Results: No results found.  Scheduled Meds:  allopurinol  300 mg Oral Daily   apixaban  5 mg Oral BID   carvedilol  12.5 mg Oral BID   fluticasone  2 spray Each Nare Daily   furosemide  40 mg Oral Daily   Gerhardt's butt cream    Topical Daily   saccharomyces boulardii  250 mg Oral BID   sodium chloride flush  3 mL Intravenous Q12H   Continuous Infusions:  sodium chloride     PRN Meds:.sodium chloride, acetaminophen, guaiFENesin, ipratropium-albuterol, lidocaine, loperamide, sodium chloride, sodium chloride flush   ASSESMENT:   Minor bloody stool.  Resolved.  Persistent loose, watery but not bloody stool for several weeks and unclear treatment for C. difficile while at SNF a couple of months ago.  C. difficile is negative.  Started Florastor 5/30.  Loperamide prn added today but has yet to receive any doses..  Chronic Eliquis, now restarted.  Bedias anemia.  Hgb stable 10.4    Hx of gout for which he takes allopurinol.  This medication can lead to diarrhea but predates onset of patient's diarrhea as he has been taking this for several years.   PLAN   For the next 3 days give standing dose of Imodium every morning.  He can still have as needed doses available as well.      Logan [name]  09/18/2021, 11:20 AM Phone [phone]

## 2021-09-19 LAB — BASIC METABOLIC PANEL
Anion gap: 3 — ABNORMAL LOW (ref 5–15)
BUN: 14 mg/dL (ref 8–23)
CO2: 34 mmol/L — ABNORMAL HIGH (ref 22–32)
Calcium: 8.6 mg/dL — ABNORMAL LOW (ref 8.9–10.3)
Chloride: 103 mmol/L (ref 98–111)
Creatinine, Ser: 0.77 mg/dL (ref 0.61–1.24)
GFR, Estimated: >60 mL/min (ref >60)
Glucose, Bld: 76 mg/dL (ref 70–99)
Potassium: 3.8 mmol/L (ref 3.5–5.1)
Sodium: 140 mmol/L (ref 135–145)

## 2021-09-19 LAB — CBC WITH DIFFERENTIAL/PLATELET
Abs Immature Granulocytes: 0.64 10*3/uL — ABNORMAL HIGH (ref 0.00–0.07)
Basophils Absolute: 0.1 10*3/uL (ref 0.0–0.1)
Basophils Relative: 1 %
Eosinophils Absolute: 0.4 10*3/uL (ref 0.0–0.5)
Eosinophils Relative: 3 %
HCT: 34.7 % — ABNORMAL LOW (ref 39.0–52.0)
Hemoglobin: 11.1 g/dL — ABNORMAL LOW (ref 13.0–17.0)
Immature Granulocytes: 5 %
Lymphocytes Relative: 25 %
Lymphs Abs: 3.4 10*3/uL (ref 0.7–4.0)
MCH: 29.6 pg (ref 26.0–34.0)
MCHC: 32 g/dL (ref 30.0–36.0)
MCV: 92.5 fL (ref 80.0–100.0)
Monocytes Absolute: 1.2 10*3/uL — ABNORMAL HIGH (ref 0.1–1.0)
Monocytes Relative: 9 %
Neutro Abs: 7.9 10*3/uL — ABNORMAL HIGH (ref 1.7–7.7)
Neutrophils Relative %: 57 %
Platelets: 481 10*3/uL — ABNORMAL HIGH (ref 150–400)
RBC: 3.75 MIL/uL — ABNORMAL LOW (ref 4.22–5.81)
RDW: 15.3 % (ref 11.5–15.5)
WBC: 13.6 10*3/uL — ABNORMAL HIGH (ref 4.0–10.5)
nRBC: 0.1 % (ref 0.0–0.2)

## 2021-09-19 NOTE — Progress Notes (Signed)
FPTS Brief Note Reviewed patient's vitals, recent notes.  Vitals:   09/19/21 1012 09/19/21 2022  BP: 122/61 139/82  Pulse: 80 82  Resp:  (!) 23  Temp:  98.5 F (36.9 C)  SpO2:  99%   At this time, no change in plan from day progress note.  Alen Bleacher, MD Page [phone] with questions about this patient.

## 2021-09-19 NOTE — Progress Notes (Signed)
Mobility Specialist Progress Note:   09/19/21 1553  Mobility  Activity Transferred to/from [facility]  Level of Assistance Minimal assist, patient does 75% or more  Assistive Device Front wheel walker  Distance Ambulated (ft) 8 ft  Activity Response Tolerated well  $Mobility charge 1 Mobility   Pt received in bed asking to use BSC. No complaints of pain. Pt able to have BM, Required peri-care. Left in bed with call bell in reach and all needs met.   [facility] Lonn Im Mobility Specialist

## 2021-09-19 NOTE — Progress Notes (Signed)
Family Medicine Teaching Service Daily Progress Note Intern Pager: [phone]  Patient name: Logan [name] Medical record number: [id number] Date of birth: [birth year] Age: 76 y.o. Gender: male  Primary Care Provider: [name], MD Consultants: GI Code Status: Full  Pt Overview and Major Events to Date:  5/25: Admitted  Assessment and Plan:  Logan [name] is a 76 year old male admitted for acute hypoxic respiratory failure likely multifactorial 2/2 COPD exacerbation/CHF/pneumonia. PMHx significant for HFpEF, HTN, PE, DVT, aspiration pneumonia, A-fib on Eliquis, T2DM, gout, HLD, and cervical disc disease.  Acute hypoxic respiratory failure Patient continues to sat 96-100% on RA. With some tachypnea throughout the day, but otherwise VSS. WBC stable at 13.6. [place] downtrending despite inc in WBC. Patient medically ready for SNF at this time. --O2 sats goal 88 to 92% --DuoNeb PRN --Guanfacine PRN  Rectal bleeding  Hematochezia GI consulted and signed off; appreciate care. Patient continues to report normal BM. Hgb maintained at 11.1, even after restarting Eliquis. --Per GI, continue Imodium 2 mg daily x 3 days (Day 2/3) --Continue Florastor 250 mg BID --Continue to monitor daily CBC --Continue to monitor symptoms  Normocytic anemia Chronic and stable. Hgb 11.1, baseline 8-9. Remains asymptomatic.  ---Continue to monitor daily CBC  HFpEF  HTN MR BP 126/82. 24 hr BP: 125-157/65-83. Home antihypertensives restarted. --Continue home Coreg 12.5 mg BID --Continue home Lasix 40 mg  Hx of PE/DVT  Afib --Continue home Eliquis 5 mg BID  Chronic, stable conditions R hip pain--asymptomatic Recent COVID- dx 3/25 Stage I sacral pressure ulcer - Gerhardt's butt cream T2DM-diet controlled; has remained euglycemic throughout admission    FEN/GI: Regular PPx: SQ heparin Dispo:SNF in 2-3 days. Barriers include insurance authorization.   Subjective:  Patient continues to report  feeling generally weak, but has been working with PT appropriately and using his incentive spirometer.  He reports formed stools, and denies blood in stool.  He has no acute concerns or complaints today and feels ready to discharge to SNF.  Objective: Temp:  [97.9 F (36.6 C)-98 F (36.7 C)] 98 F (36.7 C) (06/01 0400) Pulse Rate:  [33-82] 78 (06/01 0400) Resp:  [13-33] 23 (06/01 0400) BP: (125-157)/(65-83) 126/82 (06/01 0400) SpO2:  [78 %-100 %] 98 % (06/01 0400) Weight:  [78 kg] 78 kg (06/01 0235) Physical Exam: General: Alert, well-appearing, NAD Cardiovascular: RRR Respiratory: CTA x2.  Good WOB on room air Abdomen: Soft, NT/ND Extremities: Nonedematous bilateral lower extremities  Laboratory: Recent Labs  Lab 09/17/21 0328 09/18/21 0930 09/19/21 0358  WBC 12.4* 13.2* 13.6*  HGB 10.4* 11.1* 11.1*  HCT 33.3* 34.8* 34.7*  PLT 456* 512* 481*   Recent Labs  Lab 09/12/21 1505 09/13/21 0405 09/15/21 0305 09/17/21 0328 09/19/21 0358  NA 141   < > 142 137 140  K 3.4*   < > 4.2 3.6 3.8  CL 99   < > 108 103 103  CO2 31   < > 28 26 34*  BUN 11   < > '16 15 14  '$ CREATININE 0.62   < > 0.61 0.57* 0.77  CALCIUM 8.9   < > 9.0 8.9 8.6*  PROT 6.3*  --   --   --   --   BILITOT 1.1  --   --   --   --   ALKPHOS 79  --   --   --   --   ALT 23  --   --   --   --   AST 20  --   --   --   --  GLUCOSE 100*   < > 93 80 76   < > = values in this interval not displayed.     Imaging/Diagnostic Tests: No new images to review  Rosezetta Schlatter, MD 09/19/2021, 7:03 AM PGY-1, [place] Intern pager: [phone], text pages welcome

## 2021-09-19 NOTE — TOC Progression Note (Signed)
Transition of Care [facility]) - Progression Note    Patient Details  Name: Logan [name] MRN: [id number] Date of Birth: 02-08-1946  Transition of Care [facility]) CM/SW [place],  Phone Number: 09/19/2021, 2:30 PM  Clinical Narrative:     SNF auth still pending at this time.   Expected Discharge Plan: Skilled Nursing Facility Barriers to Discharge: Insurance Authorization  Expected Discharge Plan and Services Expected Discharge Plan: [place]   Discharge Planning Services: CM Consult   Living arrangements for the past 2 months: Single Family Home                                       Social Determinants of Health (SDOH) Interventions    Readmission Risk Interventions    01/03/2021    2:46 PM  Readmission Risk Prevention Plan  Post Dischage Appt Not Complete  Appt Comments going to INPT rehab  Medication Screening Complete  Transportation Screening Complete

## 2021-09-19 NOTE — Progress Notes (Signed)
FPTS Brief Note Reviewed patient's vitals, recent notes.  Vitals:   09/18/21 1925 09/18/21 2038  BP: 125/69 131/65  Pulse: 74 77  Resp:    Temp: 98 F (36.7 C) 98 F (36.7 C)  SpO2: 96% 96%   At this time, no change in plan from day progress note.  Alen Bleacher, MD Page [phone] with questions about this patient.

## 2021-09-19 NOTE — Plan of Care (Signed)
  Problem: Education: Goal: Knowledge of disease or condition will improve Outcome: Progressing   Problem: Activity: Goal: Ability to tolerate increased activity will improve Outcome: Progressing   Problem: Respiratory: Goal: Levels of oxygenation will improve Outcome: Progressing Goal: Ability to maintain adequate ventilation will improve Outcome: Progressing

## 2021-09-19 NOTE — Progress Notes (Signed)
Physical Therapy Treatment Patient Details Name: Logan [name] MRN: [id number] DOB: 12/11/1945 Today's Date: 09/19/2021   History of Present Illness The pt is a 76 yo male presenting 5/25 with generalized weakness, and productive cough. Undergoing work up for PNA vs new COPD vs CHF exacerbation. PMH includes: afib, HTN, CHF, HLD, DM II, DVT/PE.    PT Comments    The pt was able to demo good progress with gait distance this session. Continues to require minA to power up to standing, but demos great improvements in activity tolerance and decreased assist to maintain stability in standing and with gait. Will continue to benefit from skilled PT to progress functional strength and power to complete transfers with greater independence.   Gait Speed: 0.36ms using RW (Gait speed <0.668m indicates increased risk of falls and dependence in ADLs)   Recommendations for follow up therapy are one component of a multi-disciplinary discharge planning process, led by the attending physician.  Recommendations may be updated based on patient status, additional functional criteria and insurance authorization.  Follow Up Recommendations  Skilled nursing-short term rehab (<3 hours/day)     Assistance Recommended at Discharge Frequent or constant Supervision/Assistance  Patient can return home with the following A little help with walking and/or transfers;A lot of help with bathing/dressing/bathroom;Assistance with cooking/housework;Direct supervision/assist for medications management;Assist for transportation;Help with stairs or ramp for entrance   Equipment Recommendations  None recommended by PT    Recommendations for Other Services       Precautions / Restrictions Precautions Precautions: Fall Precaution Comments: incontinent of BM, watch O2 Restrictions Weight Bearing Restrictions: No     Mobility  Bed Mobility Overal bed mobility: Needs Assistance Bed Mobility: Supine to Sit, Sit to Supine      Supine to sit: Min assist Sit to supine: Min assist   General bed mobility comments: minA to complete movement and elevate trunk, minA to scoot to EOB    Transfers Overall transfer level: Needs assistance Equipment used: Rolling walker (2 wheels) Transfers: Sit to/from Stand, Bed to chair/wheelchair/BSC Sit to Stand: Min assist           General transfer comment: minA to power up to standing with pt using momentum. pt neeidng increased time to rise.    Ambulation/Gait Ambulation/Gait assistance: Min assist Gait Distance (Feet): 150 Feet Assistive device: Rolling walker (2 wheels) Gait Pattern/deviations: Trunk flexed, Decreased stride length, Step-to pattern Gait velocity: 0.28 m/s Gait velocity interpretation: <1.31 ft/sec, indicative of household ambulator   General Gait Details: small step-to gait with RLE not passing LLE. trunk flexed but no overt LOB, pt states this feels like his baseline mobility      Balance Overall balance assessment: Needs assistance Sitting-balance support: Bilateral upper extremity supported Sitting balance-Leahy Scale: Fair Sitting balance - Comments: static sitting without UE support   Standing balance support: Bilateral upper extremity supported, Reliant on assistive device for balance, During functional activity Standing balance-Leahy Scale: Poor Standing balance comment: Pt needed UE support from rolling walker                            Cognition Arousal/Alertness: Awake/alert Behavior During Therapy: WFL for tasks assessed/performed Overall Cognitive Status: Within Functional Limits for tasks assessed Area of Impairment: Attention, Following commands, Safety/judgement, Awareness, Problem solving                   Current Attention Level: Divided   Following Commands: Follows one step  commands consistently, Follows multi-step commands consistently Safety/Judgement: Decreased awareness of safety Awareness:  Anticipatory Problem Solving: Requires verbal cues General Comments: pt following all cues well, less delay or need for repeated cues today           General Comments General comments (skin integrity, edema, etc.): VSS on RA      Pertinent Vitals/Pain Pain Assessment Pain Assessment: Faces Faces Pain Scale: Hurts little more Pain Location: generalized low back Pain Descriptors / Indicators: Aching Pain Intervention(s): Limited activity within patient's tolerance, Monitored during session, Repositioned     PT Goals (current goals can now be found in the care plan section) Acute Rehab PT Goals Patient Stated Goal: none stated, return home PT Goal Formulation: With patient Time For Goal Achievement: 09/27/21 Potential to Achieve Goals: Good Progress towards PT goals: Progressing toward goals    Frequency    Min 3X/week      PT Plan Current plan remains appropriate       AM-PAC PT "6 Clicks" Mobility   Outcome Measure  Help needed turning from your back to your side while in a flat bed without using bedrails?: A Little Help needed moving from lying on your back to sitting on the side of a flat bed without using bedrails?: A Little Help needed moving to and from a bed to a chair (including a wheelchair)?: A Little Help needed standing up from a chair using your arms (e.g., wheelchair or bedside chair)?: A Little Help needed to walk in hospital room?: A Little Help needed climbing 3-5 steps with a railing? : A Lot 6 Click Score: 17    End of Session Equipment Utilized During Treatment: Gait belt Activity Tolerance: Patient tolerated treatment well Patient left: in bed;with call bell/phone within reach;with bed alarm set Nurse Communication: Mobility status PT Visit Diagnosis: Other abnormalities of gait and mobility (R26.89);Muscle weakness (generalized) (M62.81)     Time: 0867-6195 PT Time Calculation (min) (ACUTE ONLY): 18 min  Charges:  $Therapeutic  Exercise: 8-22 mins                     West Carbo, PT, DPT   Acute Rehabilitation Department Pager #: [phone]   Sandra Cockayne 09/19/2021, 5:45 PM

## 2021-09-20 DIAGNOSIS — M109 Gout, unspecified: Secondary | ICD-10-CM | POA: Diagnosis not present

## 2021-09-20 DIAGNOSIS — I10 Essential (primary) hypertension: Secondary | ICD-10-CM | POA: Diagnosis not present

## 2021-09-20 DIAGNOSIS — R195 Other fecal abnormalities: Secondary | ICD-10-CM | POA: Diagnosis not present

## 2021-09-20 DIAGNOSIS — E119 Type 2 diabetes mellitus without complications: Secondary | ICD-10-CM | POA: Diagnosis not present

## 2021-09-20 DIAGNOSIS — J449 Chronic obstructive pulmonary disease, unspecified: Secondary | ICD-10-CM | POA: Diagnosis not present

## 2021-09-20 DIAGNOSIS — R5381 Other malaise: Secondary | ICD-10-CM | POA: Diagnosis not present

## 2021-09-20 DIAGNOSIS — M6281 Muscle weakness (generalized): Secondary | ICD-10-CM | POA: Diagnosis not present

## 2021-09-20 DIAGNOSIS — I4891 Unspecified atrial fibrillation: Secondary | ICD-10-CM | POA: Diagnosis not present

## 2021-09-20 DIAGNOSIS — I959 Hypotension, unspecified: Secondary | ICD-10-CM | POA: Diagnosis not present

## 2021-09-20 DIAGNOSIS — R269 Unspecified abnormalities of gait and mobility: Secondary | ICD-10-CM | POA: Diagnosis not present

## 2021-09-20 DIAGNOSIS — Z743 Need for continuous supervision: Secondary | ICD-10-CM | POA: Diagnosis not present

## 2021-09-20 DIAGNOSIS — I5033 Acute on chronic diastolic (congestive) heart failure: Secondary | ICD-10-CM | POA: Diagnosis not present

## 2021-09-20 DIAGNOSIS — I2699 Other pulmonary embolism without acute cor pulmonale: Secondary | ICD-10-CM | POA: Diagnosis not present

## 2021-09-20 DIAGNOSIS — I480 Paroxysmal atrial fibrillation: Secondary | ICD-10-CM | POA: Diagnosis not present

## 2021-09-20 DIAGNOSIS — R279 Unspecified lack of coordination: Secondary | ICD-10-CM | POA: Diagnosis not present

## 2021-09-20 DIAGNOSIS — J4540 Moderate persistent asthma, uncomplicated: Secondary | ICD-10-CM | POA: Diagnosis not present

## 2021-09-20 DIAGNOSIS — J9601 Acute respiratory failure with hypoxia: Secondary | ICD-10-CM | POA: Diagnosis not present

## 2021-09-20 DIAGNOSIS — D649 Anemia, unspecified: Secondary | ICD-10-CM | POA: Diagnosis not present

## 2021-09-20 DIAGNOSIS — I5032 Chronic diastolic (congestive) heart failure: Secondary | ICD-10-CM | POA: Diagnosis not present

## 2021-09-20 DIAGNOSIS — E669 Obesity, unspecified: Secondary | ICD-10-CM | POA: Diagnosis not present

## 2021-09-20 DIAGNOSIS — J189 Pneumonia, unspecified organism: Secondary | ICD-10-CM | POA: Diagnosis not present

## 2021-09-20 DIAGNOSIS — I629 Nontraumatic intracranial hemorrhage, unspecified: Secondary | ICD-10-CM | POA: Diagnosis not present

## 2021-09-20 DIAGNOSIS — J441 Chronic obstructive pulmonary disease with (acute) exacerbation: Secondary | ICD-10-CM | POA: Diagnosis not present

## 2021-09-20 DIAGNOSIS — M1990 Unspecified osteoarthritis, unspecified site: Secondary | ICD-10-CM | POA: Diagnosis not present

## 2021-09-20 DIAGNOSIS — E441 Mild protein-calorie malnutrition: Secondary | ICD-10-CM | POA: Diagnosis not present

## 2021-09-20 DIAGNOSIS — R262 Difficulty in walking, not elsewhere classified: Secondary | ICD-10-CM | POA: Diagnosis not present

## 2021-09-20 LAB — CBC
HCT: 36.1 % — ABNORMAL LOW (ref 39.0–52.0)
Hemoglobin: 11.6 g/dL — ABNORMAL LOW (ref 13.0–17.0)
MCH: 29.7 pg (ref 26.0–34.0)
MCHC: 32.1 g/dL (ref 30.0–36.0)
MCV: 92.3 fL (ref 80.0–100.0)
Platelets: 486 10*3/uL — ABNORMAL HIGH (ref 150–400)
RBC: 3.91 MIL/uL — ABNORMAL LOW (ref 4.22–5.81)
RDW: 15.7 % — ABNORMAL HIGH (ref 11.5–15.5)
WBC: 13.7 10*3/uL — ABNORMAL HIGH (ref 4.0–10.5)
nRBC: 0 % (ref 0.0–0.2)

## 2021-09-20 LAB — BASIC METABOLIC PANEL
Anion gap: 5 (ref 5–15)
BUN: 14 mg/dL (ref 8–23)
CO2: 32 mmol/L (ref 22–32)
Calcium: 8.9 mg/dL (ref 8.9–10.3)
Chloride: 99 mmol/L (ref 98–111)
Creatinine, Ser: 0.72 mg/dL (ref 0.61–1.24)
GFR, Estimated: >60 mL/min (ref >60)
Glucose, Bld: 83 mg/dL (ref 70–99)
Potassium: 3.8 mmol/L (ref 3.5–5.1)
Sodium: 136 mmol/L (ref 135–145)

## 2021-09-20 MED ORDER — IPRATROPIUM-ALBUTEROL 0.5-2.5 (3) MG/3ML IN SOLN
3.0000 mL | Freq: Four times a day (QID) | RESPIRATORY_TRACT | Status: DC | PRN
Start: 2021-09-20 — End: 2023-12-15

## 2021-09-20 MED ORDER — APIXABAN 5 MG PO TABS: 5.0000 mg | ORAL_TABLET | Freq: Two times a day (BID) | ORAL | 0 refills | Status: AC

## 2021-09-20 MED ORDER — GERHARDT'S BUTT CREAM: 1.0000 "application " | TOPICAL_CREAM | Freq: Every day | CUTANEOUS | Status: AC

## 2021-09-20 NOTE — Discharge Summary (Signed)
[facility] Discharge Summary  Patient name: Logan [name] Medical record number: [id number] Date of birth: Oct 07, 1945 Age: 76 y.o. Gender: male Date of Admission: 09/12/2021  Date of Discharge: 09/20/2021 Admitting Physician: Leeanne Rio, MD  Primary Care Provider: [name], MD Consultants: GI  Indication for Hospitalization: acute hypoxic respiratory failure likely multifactorial 2/2 COPD/CHF exacerbation.   Discharge Diagnoses/Problem List (all resolved this admission):  COPD Exacerbation Hypoxia Hematochezia  Disposition: SNF at [facility]  Discharge Condition: [place]  Discharge Exam:  Patient seen ambulating with PT this AM. He reports feeling weak this AM, but denies pain, as well as other acute concerns or complaints.   General: Alert, well-appearing, NAD. Ambulating with PT with RW Cardiovascular: RRR Respiratory: CTA x2.  Good work of breathing on room air Abdomen: Soft, NT/ND Extremities: Nonedematous bilateral lower extremities Neuro: Normal gait and station with [facility] Course:  Logan [name] is a 76 y.o. male who was admitted to the [facility] Teaching Service at [facility] for dyspnea and productive cough concerning for new COPD. Hospital course is outlined below by problem.   Acute hypoxic respiratory failure  Patient presented with acute onset of 2 days of worsening productive cough and SOB.  CXR on admission showed bibasilar atelectasis without consolidation.  On exam had clear breath sounds bilaterally.  Was requiring O2 supplementation while in the ED up to 4 L.  Given his long smoking history and symptoms, his presentation was more consistent with a combination of  COPD and CHF exacerbation.  Patient was treated with 3-day course of azithromycin, 40 mg prednisone for 5 days, and DuoNeb as needed.  His respiratory status improved and by the time of discharge patient has reassuring O2 sats on room  air.  Rectal Bleeding Patient was reported to hematochezia with watery diarrhea on 5/28.  Rectal exam at the time showed nonbleeding external hemorrhoid.  Of note patient was on Eliquis due to recent history of DVT/PE.  Eliquis was held  for a day and GI was consulted who recommend starting loperamide (x 3 days- completed) and Florastor and restarting his home Eliquis. Hematochezia lasted two days and spontaneously resolved after holding briefly Eliquis. Patient with formed, nonbloody stools on day of discharge.   Normocytic anemia Chronic and stable. Hgb 11.6 on day of discharge, baseline 8-9. Remained asymptomatic throughout admission.    HFpEF  HTN BP initially normotensive, and home antihypertensives held. Throughout admission, patient's BP began to increase, so home meds restarted, to be continued on discharge: Coreg 12.5 mg BID and Lasix 40 mg daily.  Chronic, stable conditions Hx of PE/Afib- Home Eliquis resumed 5/30. R hip pain--asymptomatic Recent COVID- dx 3/25 Stage I sacral pressure ulcer - Gerhardt's butt cream T2DM-diet controlled; has remained euglycemic throughout admission  PCP recommendations Consider PFTs as an outpatient  Initiated loperamide and probiotic before discharge per GI. Please ensure GI follow up and symptoms on these medications.  Home coreg and lasix restarted prior to discharge. PCP to follow up on blood pressure and adjust as needed.  Consider depression evaluation   Significant Procedures: N/A  Significant Labs and Imaging:  Recent Labs  Lab 09/18/21 0930 09/19/21 0358 09/20/21 0426  WBC 13.2* 13.6* 13.7*  HGB 11.1* 11.1* 11.6*  HCT 34.8* 34.7* 36.1*  PLT 512* 481* 486*   Recent Labs  Lab 09/14/21 0429 09/15/21 0305 09/17/21 0328 09/19/21 0358 09/20/21 0426  NA 141 142 137 140 136  K 3.2* 4.2 3.6 3.8  3.8  CL 106 108 103 103 99  CO2 '30 28 26 '$ 34* 32  GLUCOSE 91 93 80 76 83  BUN '15 16 15 14 14  '$ CREATININE 0.66 0.61 0.57* 0.77 0.72   CALCIUM 9.0 9.0 8.9 8.6* 8.9     Results/Tests Pending at Time of Discharge: N/A  Discharge Medications:  Allergies as of 09/20/2021       Reactions   Trazodone And Nefazodone Other (See Comments)   Had nightmares         Medication List     STOP taking these medications    traMADol 50 MG tablet Commonly known as: ULTRAM       TAKE these medications    allopurinol 300 MG tablet Commonly known as: ZYLOPRIM Take 300 mg by mouth daily.   apixaban 5 MG Tabs tablet Commonly known as: ELIQUIS Take 1 tablet (5 mg total) by mouth 2 (two) times daily. What changed: See the new instructions.   carvedilol 12.5 MG tablet Commonly known as: COREG Take 12.5 mg by mouth 2 (two) times daily.   DHEA 25 MG Caps Take 25 mg by mouth daily.   diclofenac Sodium 1 % Gel Commonly known as: VOLTAREN Apply 2 g topically 4 (four) times daily. To left hip What changed:  when to take this reasons to take this   fluticasone 50 MCG/ACT nasal spray Commonly known as: FLONASE Place 1 spray into both nostrils daily as needed for rhinitis.   furosemide 40 MG tablet Commonly known as: LASIX Take 1 tablet (40 mg total) by mouth daily.   Gerhardt's butt cream Crea Apply 1 application. topically daily. Start taking on: September 21, 2021   ICY HOT EX Apply 1 g topically daily as needed (pain).   ipratropium-albuterol 0.5-2.5 (3) MG/3ML Soln Commonly known as: DUONEB Take 3 mLs by nebulization every 6 (six) hours as needed.   loratadine 10 MG tablet Commonly known as: CLARITIN Take 10 mg by mouth daily as needed for allergies.   potassium chloride 10 MEQ tablet Commonly known as: KLOR-CON Take 10 mEq by mouth 2 (two) times daily.   PROBIOTIC PO Take 1 capsule by mouth in the morning and at bedtime.               Discharge Care Instructions  (From admission, onward)           Start     Ordered   09/20/21 0000  Discharge wound care:       Comments: per [facility]    09/20/21 1113            Discharge Instructions: Please refer to Patient Instructions section of EMR for full details.  Patient was counseled important signs and symptoms that should prompt return to medical care, changes in medications, dietary instructions, activity restrictions, and follow up appointments.   Follow-Up Appointments:   Rosezetta Schlatter, MD 09/20/2021, 11:56 AM PGY-1, [place]

## 2021-09-20 NOTE — Progress Notes (Signed)
Report called to nurse at [facility]. All questions and concerns addressed.

## 2021-09-20 NOTE — Plan of Care (Signed)
  Problem: Education: Goal: Knowledge of disease or condition will improve Outcome: Progressing   Problem: Activity: Goal: Ability to tolerate increased activity will improve Outcome: Progressing   Problem: Respiratory: Goal: Ability to maintain a clear airway will improve Outcome: Progressing Goal: Levels of oxygenation will improve Outcome: Progressing   Problem: Activity: Goal: Ability to tolerate increased activity will improve Outcome: Progressing

## 2021-09-20 NOTE — Progress Notes (Addendum)
Mobility Specialist Progress Note:   09/20/21 1226  Mobility  Activity Ambulated with assistance in hallway  Level of Assistance Minimal assist, patient does 75% or more  Assistive Device Front wheel walker  Distance Ambulated (ft) 60 ft  Activity Response Tolerated well  $Mobility charge 1 Mobility   Pt received in chairwilling to participate in mobility. Complaints of feeling weak. Left in chair with call bell in reach and all needs met.   [facility] Nabila Albarracin Mobility Specialist

## 2021-09-20 NOTE — NC FL2 (Signed)
[place] MEDICAID FL2 LEVEL OF CARE SCREENING TOOL     IDENTIFICATION  Patient Name: Logan [name] Birthdate: 1946-01-02 Sex: male Admission Date (Current Location): 09/12/2021  [facility] [place] Number:  Herbalist and Address:  [facility] [place]. [facility], [place] [address], [place], [place] [address]      Provider Number: [id number]  Attending Physician Name and Address:  Dickie La, MD  Relative Name and Phone Number:  Menachem, [place] [facility])   [phone] [facility] Phone)    Current Level of Care: Hospital Recommended Level of Care: [place] Prior Approval Number: [id number]  Date Approved/Denied:   PASRR Number: [id number]  Discharge Plan: SNF    Current Diagnoses: Patient Active Problem List   Diagnosis Date Noted   Loose stools    Rectal bleeding    Pressure injury of skin 09/13/2021   Hypoxia 09/13/2021   COPD (chronic obstructive pulmonary disease) ([place]) 09/12/2021   Closed coracoid process fracture 06/13/2021   Rib fractures 06/13/2021   Acute pulmonary embolism ([place]) 06/07/2021   Acute respiratory failure with hypoxia ([place]) 06/07/2021   Aspiration pneumonia ([place]) 06/07/2021   H/O total hip arthroplasty, left 06/03/2021   Osteoarthritis 03/07/2021   Chronic incomplete quadriplegia ([place]) 03/04/2021   Abnormal gait 03/04/2021   Chronic pain syndrome 03/04/2021   Insomnia due to medical condition 03/04/2021   Shoulder subluxation, right 01/31/2021   Hypoalbuminemia due to protein-calorie malnutrition (HCC)    Hypokalemia    Macrocytic anemia    Prediabetes    Cervical disc disease with myelopathy 01/03/2021   Cervical myelopathy ([place]) 01/01/2021   Pneumonia 11/05/2020   Cervical radiculopathy 11/05/2020   Acute on chronic heart failure ([place]) 11/05/2020   Seasonal allergies    Orthopnea    Morbid obesity (HCC)    DVT (deep venous thrombosis) (HCC)    Bilateral lower extremity edema    Secondary hypercoagulable state  ([place]) 03/28/2020   Persistent atrial fibrillation ([place]) 10/21/2019   Essential hypertension 10/21/2019   Chronic diastolic heart failure ([place]) 10/21/2019   Mixed hyperlipidemia 10/21/2019   Obesity (BMI 30-39.9) 08/24/2019   Sleep apnea    Primary osteoarthritis of left hip    Hypertension    Hyperlipidemia    Gout    Esophageal reflux    Diabetes mellitus without complication (HCC)    Atrial fibrillation (HCC)    Anxiety    Atherosclerotic renal artery stenosis, unilateral (HCC) 02/28/2014    Orientation RESPIRATION BLADDER Height & Weight     Self, Situation, Time, Place  Normal Incontinent, External catheter Weight: 167 lb 12.3 oz (76.1 kg) Height:  '5\' 7"'$  (170.2 cm)  BEHAVIORAL SYMPTOMS/MOOD NEUROLOGICAL BOWEL NUTRITION STATUS      Continent Diet (see d/c summary)  AMBULATORY STATUS COMMUNICATION OF NEEDS Skin   Extensive Assist Verbally PU Stage and Appropriate Care (Pressure injury, buttocks medial stage 1)                       Personal Care Assistance Level of Assistance  Bathing, Feeding, Dressing Bathing Assistance: Limited assistance Feeding assistance: Independent Dressing Assistance: Limited assistance     Functional Limitations Info  Sight, Hearing, Speech Sight Info: Adequate Hearing Info: Adequate Speech Info: Adequate    SPECIAL CARE FACTORS FREQUENCY  PT (By licensed PT), OT (By licensed OT)     PT Frequency: 5x/week OT Frequency: 5x/week            Contractures Contractures Info: Not  present    Additional Factors Info  Code Status, Allergies, Isolation Precautions Code Status Info: Full Allergies Info: no known allergies     Isolation Precautions Info: enteric precautions     Current Medications (09/20/2021):  This is the current hospital active medication list Current Facility-Administered Medications  Medication Dose Route Frequency Provider Last Rate Last Admin   0.9 %  sodium chloride infusion  250 mL Intravenous PRN Ezequiel Essex, MD       acetaminophen (TYLENOL) tablet 650 mg  650 mg Oral Q6H PRN Ezequiel Essex, MD       allopurinol (ZYLOPRIM) tablet 300 mg  300 mg Oral Daily Ezequiel Essex, MD   300 mg at 09/20/21 8144   apixaban (ELIQUIS) tablet 5 mg  5 mg Oral BID Ezequiel Essex, MD   5 mg at 09/20/21 8185   carvedilol (COREG) tablet 12.5 mg  12.5 mg Oral BID Orvis Brill, DO   12.5 mg at 09/20/21 0928   fluticasone (FLONASE) 50 MCG/ACT nasal spray 2 spray  2 spray Each Nare Daily Leeanne Rio, MD   2 spray at 09/20/21 6314   furosemide (LASIX) tablet 40 mg  40 mg Oral Daily Dameron, Luna Fuse, DO   40 mg at 09/20/21 9702   Gerhardt's butt cream   Topical Daily Gerrit Heck, MD   Given at 09/20/21 0929   guaiFENesin (MUCINEX) 12 hr tablet 600 mg  600 mg Oral BID PRN Rosezetta Schlatter, MD       ipratropium-albuterol (DUONEB) 0.5-2.5 (3) MG/3ML nebulizer solution 3 mL  3 mL Nebulization Q6H PRN Gerrit Heck, MD   3 mL at 09/16/21 2141   lidocaine (LIDODERM) 5 % 1 patch  1 patch Transdermal Daily PRN Ezequiel Essex, MD       loperamide (IMODIUM) capsule 2 mg  2 mg Oral PRN Rosezetta Schlatter, MD   2 mg at 09/18/21 2044   saccharomyces boulardii (FLORASTOR) capsule 250 mg  250 mg Oral BID Rosezetta Schlatter, MD   250 mg at 09/20/21 6378   sodium chloride (OCEAN) 0.65 % nasal spray 1 spray  1 spray Each Nare PRN Leeanne Rio, MD       sodium chloride flush (NS) 0.9 % injection 3 mL  3 mL Intravenous Q12H Ezequiel Essex, MD   3 mL at 09/20/21 0928   sodium chloride flush (NS) 0.9 % injection 3 mL  3 mL Intravenous PRN Ezequiel Essex, MD         Discharge Medications: Please see discharge summary for a list of discharge medications.  Relevant Imaging Results:  Relevant Lab Results:   Additional Information SS# [id number]; Covid Vax x 2 (no boosters yet)  [employer], LCSW

## 2021-09-20 NOTE — TOC Transition Note (Signed)
Transition of Care [facility]) - CM/SW Discharge Note   Patient Details  Name: Tayler Lassen MRN: [id number] Date of Birth: 1946/02/28  Transition of Care [facility]) CM/SW Contact:  Bethann Berkshire, [place] Phone Number: 09/20/2021, 12:12 PM   Clinical Narrative:     SNF auth approved.   Patient will DC to: [facility] Anticipated DC date: 09/20/21 Family notified: Son Sean Transport by: Corey Harold   Per MD patient ready for DC to [facility]. RN, patient, patient's family, and facility notified of DC. Discharge Summary and FL2 sent to facility. RN to call report prior to discharge [phone]). DC packet on chart. Ambulance transport requested for patient.   CSW will sign off for now as social work intervention is no longer needed. Please consult [place] again if new needs arise.    Final next level of care: [place] Barriers to Discharge: No Barriers Identified   Patient Goals and CMS Choice        Discharge Placement              Patient chooses bed at:  [facility]) Patient to be transferred to facility by: [place] Name of family member notified: Son Ayuub Patient and family notified of of transfer: 09/20/21  Discharge Plan and Services   Discharge Planning Services: CM Consult                                 Social Determinants of Health ([place]) Interventions     Readmission Risk Interventions    01/03/2021    2:46 PM  Readmission Risk Prevention Plan  Post Dischage Appt Not Complete  Appt Comments going to INPT rehab  Medication Screening Complete  Transportation Screening Complete

## 2021-09-20 NOTE — Progress Notes (Signed)
Occupational Therapy Treatment Patient Details Name: Logan [name] MRN: [id number] DOB: 1945-06-26 Today's Date: 09/20/2021   History of present illness The pt is a 76 yo male presenting 5/25 with generalized weakness, and productive cough. Undergoing work up for PNA vs new COPD vs CHF exacerbation. PMH includes: afib, HTN, CHF, HLD, DM II, DVT/PE.   OT comments  Patient up with nursing after using [facility] and OT assisted with transfer to recliner. Patient performed self care tasks seated due to stating fatigue from being up. Patient states he uses AE at home for LB dressing and was able to donn socks with sock aide with assistance to apply sock to sock aide. Patient provided level one therapy band and instructed in elbow flexion and extension exercises. Patient asked to walk in hallway with min assist for safety. Acute OT to continue to follow.    Recommendations for follow up therapy are one component of a multi-disciplinary discharge planning process, led by the attending physician.  Recommendations may be updated based on patient status, additional functional criteria and insurance authorization.    Follow Up Recommendations  Skilled nursing-short term rehab (<3 hours/day)    Assistance Recommended at Discharge Frequent or constant Supervision/Assistance  Patient can return home with the following  A little help with walking and/or transfers;A lot of help with bathing/dressing/bathroom;Assistance with cooking/housework;Direct supervision/assist for financial management   Equipment Recommendations  None recommended by OT    Recommendations for Other Services      Precautions / Restrictions Precautions Precautions: Fall Precaution Comments: incontinent of BM, watch O2 Restrictions Weight Bearing Restrictions: No       Mobility Bed Mobility Overal bed mobility: Needs Assistance             General bed mobility comments: OOB upon arrival and left in recliner at end of session.     Transfers Overall transfer level: Needs assistance Equipment used: Rolling walker (2 wheels) Transfers: Sit to/from Stand, Bed to chair/wheelchair/BSC Sit to Stand: Min assist     Step pivot transfers: Min assist     General transfer comment: verbal cues for hand placement and assistance to power up     Balance Overall balance assessment: Needs assistance Sitting-balance support: Bilateral upper extremity supported Sitting balance-Leahy Scale: Fair Sitting balance - Comments: static sitting without UE support   Standing balance support: Bilateral upper extremity supported, Reliant on assistive device for balance, During functional activity Standing balance-Leahy Scale: Poor Standing balance comment: reliant on UE support                           ADL either performed or assessed with clinical judgement   ADL Overall ADL's : Needs assistance/impaired     Grooming: Wash/dry hands;Wash/dry face;Set up;Sitting Grooming Details (indicate cue type and reason): in recliner Upper Body Bathing: Set up;Sitting Upper Body Bathing Details (indicate cue type and reason): in recliner         Lower Body Dressing: Minimal assistance;Sitting/lateral leans;With adaptive equipment Lower Body Dressing Details (indicate cue type and reason): donned socks with socks aide               General ADL Comments: patient states he uses adaptive equipment to dress at home    Extremity/Trunk Assessment Upper Extremity Assessment RUE Deficits / Details: pt reports he dislocated R shoulder 1 year ago, waiting on repair. limited ROM and functional strength            Vision  Perception     Praxis      Cognition Arousal/Alertness: Awake/alert Behavior During Therapy: WFL for tasks assessed/performed Overall Cognitive Status: Within Functional Limits for tasks assessed Area of Impairment: Attention, Following commands, Safety/judgement, Awareness, Problem solving                    Current Attention Level: Divided   Following Commands: Follows one step commands consistently, Follows multi-step commands consistently Safety/Judgement: Decreased awareness of safety Awareness: Anticipatory Problem Solving: Requires verbal cues          Exercises Exercises: General Upper Extremity General Exercises - Upper Extremity Elbow Flexion: Strengthening, Both, 10 reps, Seated, Theraband Theraband Level (Elbow Flexion): Level 1 (Yellow) Elbow Extension: Strengthening, Both, 10 reps, Seated, Theraband Theraband Level (Elbow Extension): Level 1 (Yellow)    Shoulder Instructions       General Comments      Pertinent Vitals/ Pain       Pain Assessment Pain Assessment: Faces Faces Pain Scale: Hurts little more Pain Location: generalized low back Pain Descriptors / Indicators: Aching Pain Intervention(s): Monitored during session, Repositioned  Home Living                                          Prior Functioning/Environment              Frequency  Min 2X/week        Progress Toward Goals  OT Goals(current goals can now be found in the care plan section)  Progress towards OT goals: Progressing toward goals  Acute Rehab OT Goals OT Goal Formulation: With patient Time For Goal Achievement: 09/28/21 Potential to Achieve Goals: Good ADL Goals Pt Will Perform Grooming: with min guard assist;standing Pt Will Perform Upper Body Dressing: with set-up;sitting Pt Will Perform Lower Body Dressing: with min assist;sit to/from stand Pt Will Transfer to Toilet: with min guard assist;stand pivot transfer;bedside commode Pt/caregiver will Perform Home Exercise Program: Increased strength;With theraband;With Supervision  Plan Discharge plan remains appropriate    Co-evaluation                 AM-PAC OT "6 Clicks" Daily Activity     Outcome Measure   Help from another person eating meals?: None Help from  another person taking care of personal grooming?: None Help from another person toileting, which includes using toliet, bedpan, or urinal?: A Little Help from another person bathing (including washing, rinsing, drying)?: A Lot Help from another person to put on and taking off regular upper body clothing?: A Little Help from another person to put on and taking off regular lower body clothing?: A Lot 6 Click Score: 18    End of Session Equipment Utilized During Treatment: Rolling walker (2 wheels)  OT Visit Diagnosis: Unsteadiness on feet (R26.81);Muscle weakness (generalized) (M62.81);Other symptoms and signs involving cognitive function   Activity Tolerance Patient tolerated treatment well   Patient Left in chair;with call bell/phone within reach;with chair alarm set   Nurse Communication Mobility status        Time: 0160-1093 OT Time Calculation (min): 30 min  Charges: OT General Charges $OT Visit: 1 Visit OT Treatments $Self Care/Home Management : 8-22 mins $Therapeutic Activity: 8-22 mins  Lodema Hong, [place]  Pager [phone] Office [phone]   Trixie Dredge 09/20/2021, 10:43 AM

## 2021-09-23 DIAGNOSIS — E669 Obesity, unspecified: Secondary | ICD-10-CM | POA: Diagnosis not present

## 2021-09-23 DIAGNOSIS — R269 Unspecified abnormalities of gait and mobility: Secondary | ICD-10-CM | POA: Diagnosis not present

## 2021-09-23 DIAGNOSIS — I5033 Acute on chronic diastolic (congestive) heart failure: Secondary | ICD-10-CM | POA: Diagnosis not present

## 2021-09-23 DIAGNOSIS — M6281 Muscle weakness (generalized): Secondary | ICD-10-CM | POA: Diagnosis not present

## 2021-09-23 DIAGNOSIS — J441 Chronic obstructive pulmonary disease with (acute) exacerbation: Secondary | ICD-10-CM | POA: Diagnosis not present

## 2021-09-23 DIAGNOSIS — M1990 Unspecified osteoarthritis, unspecified site: Secondary | ICD-10-CM | POA: Diagnosis not present

## 2021-09-25 DIAGNOSIS — I5033 Acute on chronic diastolic (congestive) heart failure: Secondary | ICD-10-CM | POA: Diagnosis not present

## 2021-09-25 DIAGNOSIS — J441 Chronic obstructive pulmonary disease with (acute) exacerbation: Secondary | ICD-10-CM | POA: Diagnosis not present

## 2021-09-25 DIAGNOSIS — M6281 Muscle weakness (generalized): Secondary | ICD-10-CM | POA: Diagnosis not present

## 2021-09-25 DIAGNOSIS — E669 Obesity, unspecified: Secondary | ICD-10-CM | POA: Diagnosis not present

## 2021-09-25 DIAGNOSIS — M1990 Unspecified osteoarthritis, unspecified site: Secondary | ICD-10-CM | POA: Diagnosis not present

## 2021-09-25 DIAGNOSIS — R269 Unspecified abnormalities of gait and mobility: Secondary | ICD-10-CM | POA: Diagnosis not present

## 2021-09-26 DIAGNOSIS — I629 Nontraumatic intracranial hemorrhage, unspecified: Secondary | ICD-10-CM | POA: Diagnosis not present

## 2021-09-26 DIAGNOSIS — R262 Difficulty in walking, not elsewhere classified: Secondary | ICD-10-CM | POA: Diagnosis not present

## 2021-09-26 DIAGNOSIS — I5032 Chronic diastolic (congestive) heart failure: Secondary | ICD-10-CM | POA: Diagnosis not present

## 2021-09-26 DIAGNOSIS — J441 Chronic obstructive pulmonary disease with (acute) exacerbation: Secondary | ICD-10-CM | POA: Diagnosis not present

## 2021-09-30 DIAGNOSIS — M6281 Muscle weakness (generalized): Secondary | ICD-10-CM | POA: Diagnosis not present

## 2021-09-30 DIAGNOSIS — J441 Chronic obstructive pulmonary disease with (acute) exacerbation: Secondary | ICD-10-CM | POA: Diagnosis not present

## 2021-09-30 DIAGNOSIS — M1990 Unspecified osteoarthritis, unspecified site: Secondary | ICD-10-CM | POA: Diagnosis not present

## 2021-09-30 DIAGNOSIS — I5033 Acute on chronic diastolic (congestive) heart failure: Secondary | ICD-10-CM | POA: Diagnosis not present

## 2021-09-30 DIAGNOSIS — E669 Obesity, unspecified: Secondary | ICD-10-CM | POA: Diagnosis not present

## 2021-09-30 DIAGNOSIS — R269 Unspecified abnormalities of gait and mobility: Secondary | ICD-10-CM | POA: Diagnosis not present

## 2021-10-01 ENCOUNTER — Encounter: Payer: Self-pay | Admitting: Physician Assistant

## 2021-10-02 DIAGNOSIS — J441 Chronic obstructive pulmonary disease with (acute) exacerbation: Secondary | ICD-10-CM | POA: Diagnosis not present

## 2021-10-02 DIAGNOSIS — M6281 Muscle weakness (generalized): Secondary | ICD-10-CM | POA: Diagnosis not present

## 2021-10-02 DIAGNOSIS — I5033 Acute on chronic diastolic (congestive) heart failure: Secondary | ICD-10-CM | POA: Diagnosis not present

## 2021-10-02 DIAGNOSIS — E669 Obesity, unspecified: Secondary | ICD-10-CM | POA: Diagnosis not present

## 2021-10-02 DIAGNOSIS — M1990 Unspecified osteoarthritis, unspecified site: Secondary | ICD-10-CM | POA: Diagnosis not present

## 2021-10-02 DIAGNOSIS — R269 Unspecified abnormalities of gait and mobility: Secondary | ICD-10-CM | POA: Diagnosis not present

## 2021-10-07 DIAGNOSIS — I5033 Acute on chronic diastolic (congestive) heart failure: Secondary | ICD-10-CM | POA: Diagnosis not present

## 2021-10-07 DIAGNOSIS — M1990 Unspecified osteoarthritis, unspecified site: Secondary | ICD-10-CM | POA: Diagnosis not present

## 2021-10-07 DIAGNOSIS — E669 Obesity, unspecified: Secondary | ICD-10-CM | POA: Diagnosis not present

## 2021-10-07 DIAGNOSIS — J449 Chronic obstructive pulmonary disease, unspecified: Secondary | ICD-10-CM | POA: Diagnosis not present

## 2021-10-07 DIAGNOSIS — J441 Chronic obstructive pulmonary disease with (acute) exacerbation: Secondary | ICD-10-CM | POA: Diagnosis not present

## 2021-10-07 DIAGNOSIS — M6281 Muscle weakness (generalized): Secondary | ICD-10-CM | POA: Diagnosis not present

## 2021-10-07 DIAGNOSIS — R269 Unspecified abnormalities of gait and mobility: Secondary | ICD-10-CM | POA: Diagnosis not present

## 2021-10-07 DIAGNOSIS — J189 Pneumonia, unspecified organism: Secondary | ICD-10-CM | POA: Diagnosis not present

## 2021-10-09 DIAGNOSIS — R269 Unspecified abnormalities of gait and mobility: Secondary | ICD-10-CM | POA: Diagnosis not present

## 2021-10-09 DIAGNOSIS — I5033 Acute on chronic diastolic (congestive) heart failure: Secondary | ICD-10-CM | POA: Diagnosis not present

## 2021-10-09 DIAGNOSIS — M6281 Muscle weakness (generalized): Secondary | ICD-10-CM | POA: Diagnosis not present

## 2021-10-09 DIAGNOSIS — J441 Chronic obstructive pulmonary disease with (acute) exacerbation: Secondary | ICD-10-CM | POA: Diagnosis not present

## 2021-10-09 DIAGNOSIS — E669 Obesity, unspecified: Secondary | ICD-10-CM | POA: Diagnosis not present

## 2021-10-09 DIAGNOSIS — M1990 Unspecified osteoarthritis, unspecified site: Secondary | ICD-10-CM | POA: Diagnosis not present

## 2021-10-16 DIAGNOSIS — M1990 Unspecified osteoarthritis, unspecified site: Secondary | ICD-10-CM | POA: Diagnosis not present

## 2021-10-16 DIAGNOSIS — M6281 Muscle weakness (generalized): Secondary | ICD-10-CM | POA: Diagnosis not present

## 2021-10-16 DIAGNOSIS — E669 Obesity, unspecified: Secondary | ICD-10-CM | POA: Diagnosis not present

## 2021-10-16 DIAGNOSIS — J441 Chronic obstructive pulmonary disease with (acute) exacerbation: Secondary | ICD-10-CM | POA: Diagnosis not present

## 2021-10-16 DIAGNOSIS — R269 Unspecified abnormalities of gait and mobility: Secondary | ICD-10-CM | POA: Diagnosis not present

## 2021-10-16 DIAGNOSIS — I5033 Acute on chronic diastolic (congestive) heart failure: Secondary | ICD-10-CM | POA: Diagnosis not present

## 2021-10-21 DIAGNOSIS — J189 Pneumonia, unspecified organism: Secondary | ICD-10-CM | POA: Diagnosis not present

## 2021-10-21 DIAGNOSIS — M6281 Muscle weakness (generalized): Secondary | ICD-10-CM | POA: Diagnosis not present

## 2021-10-21 DIAGNOSIS — I5033 Acute on chronic diastolic (congestive) heart failure: Secondary | ICD-10-CM | POA: Diagnosis not present

## 2021-10-21 DIAGNOSIS — R269 Unspecified abnormalities of gait and mobility: Secondary | ICD-10-CM | POA: Diagnosis not present

## 2021-10-21 DIAGNOSIS — J441 Chronic obstructive pulmonary disease with (acute) exacerbation: Secondary | ICD-10-CM | POA: Diagnosis not present

## 2021-10-21 DIAGNOSIS — J4540 Moderate persistent asthma, uncomplicated: Secondary | ICD-10-CM | POA: Diagnosis not present

## 2021-10-21 DIAGNOSIS — E669 Obesity, unspecified: Secondary | ICD-10-CM | POA: Diagnosis not present

## 2021-10-21 DIAGNOSIS — M1990 Unspecified osteoarthritis, unspecified site: Secondary | ICD-10-CM | POA: Diagnosis not present

## 2021-10-23 DIAGNOSIS — R269 Unspecified abnormalities of gait and mobility: Secondary | ICD-10-CM | POA: Diagnosis not present

## 2021-10-23 DIAGNOSIS — E669 Obesity, unspecified: Secondary | ICD-10-CM | POA: Diagnosis not present

## 2021-10-23 DIAGNOSIS — J441 Chronic obstructive pulmonary disease with (acute) exacerbation: Secondary | ICD-10-CM | POA: Diagnosis not present

## 2021-10-23 DIAGNOSIS — M6281 Muscle weakness (generalized): Secondary | ICD-10-CM | POA: Diagnosis not present

## 2021-10-23 DIAGNOSIS — I5033 Acute on chronic diastolic (congestive) heart failure: Secondary | ICD-10-CM | POA: Diagnosis not present

## 2021-10-23 DIAGNOSIS — M1990 Unspecified osteoarthritis, unspecified site: Secondary | ICD-10-CM | POA: Diagnosis not present

## 2021-10-24 DIAGNOSIS — I4891 Unspecified atrial fibrillation: Secondary | ICD-10-CM | POA: Diagnosis not present

## 2021-10-24 DIAGNOSIS — R5381 Other malaise: Secondary | ICD-10-CM | POA: Diagnosis not present

## 2021-10-24 DIAGNOSIS — I2699 Other pulmonary embolism without acute cor pulmonale: Secondary | ICD-10-CM | POA: Diagnosis not present

## 2021-10-24 DIAGNOSIS — E441 Mild protein-calorie malnutrition: Secondary | ICD-10-CM | POA: Diagnosis not present

## 2021-10-28 ENCOUNTER — Other Ambulatory Visit: Payer: Self-pay | Admitting: Cardiology

## 2021-10-28 NOTE — Progress Notes (Unsigned)
10/29/2021 Logan [name] [id number] Apr 08, 1946  Referring provider: [name], MD Primary GI doctor: Dr. Hilarie Fredrickson  ASSESSMENT AND PLAN:   Rectal bleeding During hospitalization on eliquis after Cdiff infection. Will get records From Dr. Melina Copa  states had colon 4-5 years ago If this is unremarkable, no plan to pursue endoscopic evaluation unless further rebleeding while on DOAC as patient is higher risk for endoscopic evaluation Phone: [phone]. Fax: [phone]. -     CBC with Differential/Platelet; Future -     IBC + Ferritin; Future  Clostridioides difficile diarrhea Likely more post infectious IBS, having 2 BMs a day, some formed stools. Still on imodium just once daily.  Given information about FODMAP diet, can continue imodium as needed.  If any worsening diarrhea, counseled on calling us to evaluate for Cdiff, can reoccur.   Anemia, unspecified type Suggest getting b12, will recheck CBC and check iron No further rebleeding, continue to monitor outpatient.  If any further anemia/rebleeding will consider endoscopic evaluation, will get records from previous GI -     CBC with Differential/Platelet; Future -     IBC + Ferritin; Future  Patient with multiple comorbid conditions worsened by prolonged course post hip surgery with COVID pneumonia On eliquis, has COPD, CHF, continuing weakness from recent illness, not a great candidate for endoscopic evaluation.     Patient Care Team: [name], MD as PCP - General (Internal Medicine) Berniece Salines, DO as PCP - Cardiology (Cardiology) Constance Haw, MD as PCP - Electrophysiology (Cardiology)  HISTORY OF PRESENT ILLNESS: 76 y.o. male with a past medical history of chronic incomplete quadriplegia with focal radiculopathy, orbit obesity, chronic pain syndrome, macrocytic anemia, type 2 diabetes, COPD with sleep apnea, history of C. difficile, persistent atrial fibrillation, history of DVT, chronic  diastolic heart failure on chronic Eliquis follows with Dr. Harriet Masson and others listed below presents for evaluation of rectal bleeding.   09/16/2021 seen in the hospital for hematochezia and diarrhea.  He was admitted for hypoxic respiratory failure likely due to COPD/CHF.   Reports colonoscopy 4 to 5 years ago in [place], Dr. Melina Copa.  Patient was restarted on Eliquis without any significant rebleeding, scheduled Imodium 2 mg daily. Patient had hip surgery, caught COVID and had ABX, had severe diarrhea and was treated at [facility] for Cdiff.  Negative Cdiff in the hospital.  He was having rectal bleeding and found to have hemoglobin 10.4.  09/20/2021 hemoglobin 11.6, remaining stable and actually increased from discharge 10.4.  MCV is 92.3.   05/2021 B12 230, folate normal, iron has not been checked  Just released from Rehab 2 days ago, and son is here, Logan [name] and provides some of the history, staying at his house.  Was put on imodium in the hospital.  He states when he eats he has some gas and has small BM, loose with some formed stools now.  He has been having 2 BM's a day.  Denies AB cramping, blood in the stool.    Current Medications:    Current Outpatient Medications (Cardiovascular):    carvedilol (COREG) 12.5 MG tablet, Take 12.5 mg by mouth 2 (two) times daily.   furosemide (LASIX) 40 MG tablet, Take 1 tablet by mouth once daily  Current Outpatient Medications (Respiratory):    fluticasone (FLONASE) 50 MCG/ACT nasal spray, Place 1 spray into both nostrils daily as needed for rhinitis.    ipratropium-albuterol (DUONEB) 0.5-2.5 (3) MG/3ML SOLN, Take 3 mLs by nebulization every 6 (six) hours as  needed.   loratadine (CLARITIN) 10 MG tablet, Take 10 mg by mouth daily as needed for allergies.  Current Outpatient Medications (Analgesics):    allopurinol (ZYLOPRIM) 300 MG tablet, Take 300 mg by mouth daily.  Current Outpatient Medications (Hematological):    apixaban (ELIQUIS) 5 MG  TABS tablet, Take 1 tablet (5 mg total) by mouth 2 (two) times daily.  Current Outpatient Medications (Other):    DHEA 25 MG CAPS, Take 25 mg by mouth daily.   diclofenac Sodium (VOLTAREN) 1 % GEL, Apply 2 g topically 4 (four) times daily. To left hip (Patient taking differently: Apply 2 g topically 4 (four) times daily as needed (pain). To left hip)   Menthol, Topical Analgesic, (ICY HOT EX), Apply 1 g topically daily as needed (pain).   Nystatin (GERHARDT'S BUTT CREAM) CREA, Apply 1 application. topically daily.   potassium chloride (KLOR-CON) 10 MEQ tablet, Take 10 mEq by mouth 2 (two) times daily.   Probiotic Product (PROBIOTIC PO), Take 1 capsule by mouth in the morning and at bedtime.  Medical History:  Past Medical History:  Diagnosis Date   Anxiety    Atherosclerotic renal artery stenosis, unilateral ([place]) 02/28/2014   Atrial fibrillation (HCC)    Bilateral lower extremity edema    Cardiac arrhythmia due to congenital heart disease    Chronic diastolic heart failure ([place]) 10/21/2019   Diabetes mellitus without complication (HCC)    DVT (deep venous thrombosis) (HCC)    Dysrhythmia    Esophageal reflux    Essential hypertension 10/21/2019   Gout    H/O blood clots    Hyperlipidemia    Hypertension    Mixed hyperlipidemia 10/21/2019   Morbid obesity ([place])    Obesity (BMI 30-39.9) 08/24/2019   Orthopnea    Osteoarthritis    Persistent atrial fibrillation ([place]) 10/21/2019   Pneumonia    Seasonal allergies    Sleep apnea    Allergies:  Allergies  Allergen Reactions   Trazodone And Nefazodone Other (See Comments)    Had nightmares      Surgical History:  He  has a past surgical history that includes Appendectomy; abdominal aortagram (N/A, 02/28/2014); Carpal tunnel release (Left); ATRIAL FIBRILLATION ABLATION (N/A, 02/29/2020); CARDIOVERSION (N/A, 10/18/2020); Anterior cervical decomp/discectomy fusion (N/A, 01/01/2021); and Total hip arthroplasty (Left,  06/03/2021). Family History:  His family history includes Atrial fibrillation in his brother; Cancer in his father; Heart disease in his brother and mother. Social History:   reports that he has quit smoking. He has quit using smokeless tobacco.  His smokeless tobacco use included chew. He reports that he does not drink alcohol and does not use drugs.  REVIEW OF SYSTEMS  : All other systems reviewed and negative except where noted in the History of Present Illness.   PHYSICAL EXAM: BP 123/79   Pulse 65   Ht '5\' 7"'$  (1.702 m)   Wt 170 lb (77.1 kg)   BMI 26.63 kg/m  General:   elderly male, walking with walker, no acute distress.  Head:   Normocephalic and atraumatic. Eyes:  sclerae anicteric,conjunctive pink  Heart:   Afib Pulm:  decreased breath sounds diffusely, possible right sided lower wheezing Abdomen:   Soft, Obese AB, Active bowel sounds. No tenderness . Without guarding and Without rebound, No organomegaly appreciated. Rectal: Not evaluated Extremities:  Without edema. Msk: Symmetrical without gross deformities, walks with walker Peripheral pulses intact.  Neurologic:  Alert and  oriented x4; Some dysarthria.  Skin:   Dry and intact  without significant lesions or rashes. Psychiatric:  Cooperative. Normal mood and affect.    Vladimir Crofts, PA-C 10:18 AM

## 2021-10-29 ENCOUNTER — Telehealth: Payer: Self-pay | Admitting: Physician Assistant

## 2021-10-29 ENCOUNTER — Ambulatory Visit (INDEPENDENT_AMBULATORY_CARE_PROVIDER_SITE_OTHER): Payer: Medicare HMO | Admitting: Physician Assistant

## 2021-10-29 ENCOUNTER — Encounter: Payer: Self-pay | Admitting: Physician Assistant

## 2021-10-29 ENCOUNTER — Other Ambulatory Visit (INDEPENDENT_AMBULATORY_CARE_PROVIDER_SITE_OTHER): Payer: Medicare HMO

## 2021-10-29 ENCOUNTER — Other Ambulatory Visit: Payer: Medicare HMO

## 2021-10-29 VITALS — BP 123/79 | HR 65 | Ht 67.0 in | Wt 170.0 lb

## 2021-10-29 DIAGNOSIS — A0472 Enterocolitis due to Clostridium difficile, not specified as recurrent: Secondary | ICD-10-CM

## 2021-10-29 DIAGNOSIS — J449 Chronic obstructive pulmonary disease, unspecified: Secondary | ICD-10-CM | POA: Diagnosis not present

## 2021-10-29 DIAGNOSIS — K625 Hemorrhage of anus and rectum: Secondary | ICD-10-CM | POA: Diagnosis not present

## 2021-10-29 DIAGNOSIS — I2699 Other pulmonary embolism without acute cor pulmonale: Secondary | ICD-10-CM | POA: Diagnosis not present

## 2021-10-29 DIAGNOSIS — I509 Heart failure, unspecified: Secondary | ICD-10-CM

## 2021-10-29 DIAGNOSIS — D649 Anemia, unspecified: Secondary | ICD-10-CM

## 2021-10-29 LAB — CBC WITH DIFFERENTIAL/PLATELET
Basophils Absolute: 0.1 10*3/uL (ref 0.0–0.1)
Basophils Relative: 0.8 % (ref 0.0–3.0)
Eosinophils Absolute: 0.4 10*3/uL (ref 0.0–0.7)
Eosinophils Relative: 3.6 % (ref 0.0–5.0)
HCT: 38 % — ABNORMAL LOW (ref 39.0–52.0)
Hemoglobin: 12.4 g/dL — ABNORMAL LOW (ref 13.0–17.0)
Lymphocytes Relative: 15 % (ref 12.0–46.0)
Lymphs Abs: 1.6 10*3/uL (ref 0.7–4.0)
MCHC: 32.6 g/dL (ref 30.0–36.0)
MCV: 91.6 fl (ref 78.0–100.0)
Monocytes Absolute: 0.7 10*3/uL (ref 0.1–1.0)
Monocytes Relative: 6.6 % (ref 3.0–12.0)
Neutro Abs: 8.1 10*3/uL — ABNORMAL HIGH (ref 1.4–7.7)
Neutrophils Relative %: 74 % (ref 43.0–77.0)
Platelets: 355 10*3/uL (ref 150.0–400.0)
RBC: 4.14 Mil/uL — ABNORMAL LOW (ref 4.22–5.81)
RDW: 19.9 % — ABNORMAL HIGH (ref 11.5–15.5)
WBC: 10.9 10*3/uL — ABNORMAL HIGH (ref 4.0–10.5)

## 2021-10-29 LAB — IBC + FERRITIN
Ferritin: 55.9 ng/mL (ref 22.0–322.0)
Iron: 66 ug/dL (ref 42–165)
Saturation Ratios: 18.9 % — ABNORMAL LOW (ref 20.0–50.0)
TIBC: 348.6 ug/dL (ref 250.0–450.0)
Transferrin: 249 mg/dL (ref 212.0–360.0)

## 2021-10-29 NOTE — Progress Notes (Signed)
Addendum: Reviewed and agree with assessment and management plan. Concha Sudol M, MD  

## 2021-10-29 NOTE — Telephone Encounter (Signed)
Refer to lab result note 7/11.

## 2021-10-29 NOTE — Telephone Encounter (Signed)
Patient returned your phone call.  Please call him back.  Thank you.

## 2021-10-29 NOTE — Patient Instructions (Addendum)
Your provider has requested that you go to the basement level for lab work before leaving today. Press "B" on the elevator. The lab is located at the first door on the left as you exit the elevator.  If you get worse diarrhea let Logan [name] know, especially if you have an antibiotic- you can get Cdiff again.   Can continue imodium as needed.   You may have POST INFECTIOUS IBS OR IRRITABLE BOWEL After an infection or diverticulitis flare your intestines can spasm or be a little bit more sensitive. Try these things below:  Can do  FODMAP- see below Try trial off milk/lactose products.  Add fiber like benefiber or citracel once a day Can do trial of IBGard for AB pain EVERY DAY- Take 1-2 capsules once a day for maintence or twice a day during a flare if any worsening symptoms like blood in stool, weight loss, please call the office or go to the ER.    FODMAP stands for fermentable oligo-, di-, mono-saccharides and polyols (1). These are the scientific terms used to classify groups of carbs that are notorious for triggering digestive symptoms like bloating, gas and stomach pain.   FODMAPs are found in a wide range of foods in varying amounts. Some foods contain just one type, while others contain several.  The main dietary sources of the four groups of FODMAPs include:  Oligosaccharides: Wheat, rye, legumes and various fruits and vegetables, such as garlic and onions.  Disaccharides: Milk, yogurt and soft cheese. Lactose is the main carb.  Monosaccharides: Various fruit including figs and mangoes, and sweeteners such as honey and agave nectar. Fructose is the main carb.  Polyols: Certain fruits and vegetables including blackberries and lychee, as well as some low-calorie sweeteners like those in sugar-free gum.   Keep a food diary. This will help you identify foods that cause symptoms. Write down: What you eat and when. What symptoms you have. When symptoms occur in relation to your  meals. Avoid foods that cause symptoms. Talk with your dietitian about other ways to get the same nutrients that are in these foods. Eat your meals slowly, in a relaxed setting. Aim to eat 5-6 small meals per day. Do not skip meals. Drink enough fluids to keep your urine clear or pale yellow. If dairy products cause your symptoms to flare up, try eating less of them. You might be able to handle yogurt better than other dairy products because it contains bacteria that help with digestion.      B12 is mainly in meat, so increase meat can help this but often people have a deficiency that they are just not absorbing it well with meat or pills, so get the sublingual/melt in your mouth one.   If the sublingual one dose not increase your level, we will discuss shots.   Vitamin B12 Deficiency Vitamin B12 deficiency occurs when the body does not have enough vitamin B12, which is an important vitamin. The body needs this vitamin: To make red blood cells. To make DNA. This is the genetic material inside cells. To help the nerves work properly so they can carry messages from the brain to the body. Vitamin B12 deficiency can cause various health problems, such as a low red blood cell count (anemia) or nerve damage. What are the causes? This condition may be caused by: Not eating enough foods that contain vitamin B12. Not having enough stomach acid and digestive fluids to properly absorb vitamin B12 from the food that you  eat. Certain digestive system diseases that make it hard to absorb vitamin B12. These diseases include Crohn's disease, chronic pancreatitis, and cystic fibrosis. A condition in which the body does not make enough of a protein (intrinsic factor), resulting in too few red blood cells (pernicious anemia). Having a surgery in which part of the stomach or small intestine is removed. Taking certain medicines that make it hard for the body to absorb vitamin B12. These medicines  include: Heartburn medicines (antacids and proton pump inhibitors). Certain antibiotic medicines. Some medicines that are used to treat diabetes, tuberculosis, gout, or high cholesterol. What increases the risk? The following factors may make you more likely to develop a B12 deficiency: Being older than age 16. Eating a vegetarian or vegan diet, especially while you are pregnant. Eating a poor diet while you are pregnant. Taking certain medicines. Having alcoholism. What are the signs or symptoms? In some cases, there are no symptoms of this condition. If the condition leads to anemia or nerve damage, various symptoms can occur, such as: Weakness. Fatigue. Loss of appetite. Weight loss. Numbness or tingling in your hands and feet. Redness and burning of the tongue. Confusion or memory problems. Depression. Sensory problems, such as color blindness, ringing in the ears, or loss of taste. Diarrhea or constipation. Trouble walking. If anemia is severe, symptoms can include: Shortness of breath. Dizziness. Rapid heart rate (tachycardia). How is this diagnosed? This condition may be diagnosed with a blood test to measure the level of vitamin B12 in your blood. You may also have other tests, including: A group of tests that measure certain characteristics of blood cells (complete blood count, CBC). A blood test to measure intrinsic factor. A procedure where a thin tube with a camera on the end is used to look into your stomach or intestines (endoscopy). Other tests may be needed to discover the cause of B12 deficiency. How is this treated? Treatment for this condition depends on the cause. This condition may be treated by: Changing your eating and drinking habits, such as: Eating more foods that contain vitamin B12. Drinking less alcohol or no alcohol. Getting vitamin B12 injections. Taking vitamin B12 supplements. Your health care provider will tell you which dosage is best for  you. Follow these instructions at home: Eating and drinking  Eat lots of healthy foods that contain vitamin B12, including: Meats and poultry. This includes beef, pork, chicken, [place], and organ meats, such as liver. Seafood. This includes clams, rainbow trout, salmon, tuna, and haddock. Eggs. Cereal and dairy products that are fortified. This means that vitamin B12 has been added to the food. Check the label on the package to see if the food is fortified. The items listed above may not be a complete list of recommended foods and beverages. Contact a dietitian for more information. General instructions Get any injections that are prescribed by your health care provider. Take supplements only as told by your health care provider. Follow the directions carefully. Do not drink alcohol if your health care provider tells you not to. In some cases, you may only be asked to limit alcohol use. Keep all follow-up visits as told by your health care provider. This is important. Contact a health care provider if: Your symptoms come back. Get help right away if you: Develop shortness of breath. Have a rapid heart rate. Have chest pain. Become dizzy or lose consciousness. Summary Vitamin B12 deficiency occurs when the body does not have enough vitamin B12. The main causes of  vitamin B12 deficiency include dietary deficiency, digestive diseases, pernicious anemia, and having a surgery in which part of the stomach or small intestine is removed. In some cases, there are no symptoms of this condition. If the condition leads to anemia or nerve damage, various symptoms can occur, such as weakness, shortness of breath, and numbness. Treatment may include getting vitamin B12 injections or taking vitamin B12 supplements. Eat lots of healthy foods that contain vitamin B12. This information is not intended to replace advice given to you by your health care provider. Make sure you discuss any questions you have  with your health care provider. Document Revised: 09/24/2018 Document Reviewed: 12/15/2017 Elsevier Patient Education  [employer].  Due to recent changes in healthcare laws, you may see the results of your imaging and laboratory studies on MyChart before your provider has had a chance to review them.  We understand that in some cases there may be results that are confusing or concerning to you. Not all laboratory results come back in the same time frame and the provider may be waiting for multiple results in order to interpret others.  Please give Logan [name] 48 hours in order for your provider to thoroughly review all the results before contacting the office for clarification of your results.    If you are age 27 or older, your body mass index should be between 23-30. Your Body mass index is 26.63 kg/m. If this is out of the aforementioned range listed, please consider follow up with your Primary Care Provider.  If you are age 82 or younger, your body mass index should be between 19-25. Your Body mass index is 26.63 kg/m. If this is out of the aformentioned range listed, please consider follow up with your Primary Care Provider.   ________________________________________________________  The [facility] providers would like to encourage you to use [facility] to communicate with providers for non-urgent requests or questions.  Due to long hold times on the telephone, sending your provider a message by [facility] may be a faster and more efficient way to get a response.  Please allow 48 business hours for a response.  Please remember that this is for non-urgent requests.  _______________________________________________________   We will try to obtain your colonoscopy report from Dr Melina Copa   I appreciate the  opportunity to care for you  Thank You   [facility]

## 2021-11-04 DIAGNOSIS — Q433 Congenital malformations of intestinal fixation: Secondary | ICD-10-CM | POA: Diagnosis not present

## 2021-11-04 DIAGNOSIS — J4540 Moderate persistent asthma, uncomplicated: Secondary | ICD-10-CM | POA: Diagnosis not present

## 2021-11-15 DIAGNOSIS — E1122 Type 2 diabetes mellitus with diabetic chronic kidney disease: Secondary | ICD-10-CM | POA: Diagnosis not present

## 2021-11-15 DIAGNOSIS — I130 Hypertensive heart and chronic kidney disease with heart failure and stage 1 through stage 4 chronic kidney disease, or unspecified chronic kidney disease: Secondary | ICD-10-CM | POA: Diagnosis not present

## 2021-11-15 DIAGNOSIS — N189 Chronic kidney disease, unspecified: Secondary | ICD-10-CM | POA: Diagnosis not present

## 2021-11-15 DIAGNOSIS — I5033 Acute on chronic diastolic (congestive) heart failure: Secondary | ICD-10-CM | POA: Diagnosis not present

## 2021-11-25 DIAGNOSIS — K2100 Gastro-esophageal reflux disease with esophagitis, without bleeding: Secondary | ICD-10-CM | POA: Diagnosis not present

## 2021-11-25 DIAGNOSIS — J449 Chronic obstructive pulmonary disease, unspecified: Secondary | ICD-10-CM | POA: Diagnosis not present

## 2021-11-25 DIAGNOSIS — I5032 Chronic diastolic (congestive) heart failure: Secondary | ICD-10-CM | POA: Diagnosis not present

## 2021-11-25 DIAGNOSIS — I5022 Chronic systolic (congestive) heart failure: Secondary | ICD-10-CM | POA: Diagnosis not present

## 2021-11-25 DIAGNOSIS — J441 Chronic obstructive pulmonary disease with (acute) exacerbation: Secondary | ICD-10-CM | POA: Diagnosis not present

## 2021-11-25 DIAGNOSIS — M1000 Idiopathic gout, unspecified site: Secondary | ICD-10-CM | POA: Diagnosis not present

## 2021-11-25 DIAGNOSIS — Z7901 Long term (current) use of anticoagulants: Secondary | ICD-10-CM | POA: Diagnosis not present

## 2021-11-25 DIAGNOSIS — R269 Unspecified abnormalities of gait and mobility: Secondary | ICD-10-CM | POA: Diagnosis not present

## 2021-11-25 DIAGNOSIS — I110 Hypertensive heart disease with heart failure: Secondary | ICD-10-CM | POA: Diagnosis not present

## 2021-11-25 DIAGNOSIS — Z0000 Encounter for general adult medical examination without abnormal findings: Secondary | ICD-10-CM | POA: Diagnosis not present

## 2021-11-25 DIAGNOSIS — I480 Paroxysmal atrial fibrillation: Secondary | ICD-10-CM | POA: Diagnosis not present

## 2021-11-25 DIAGNOSIS — M5000 Cervical disc disorder with myelopathy, unspecified cervical region: Secondary | ICD-10-CM | POA: Diagnosis not present

## 2021-11-26 ENCOUNTER — Other Ambulatory Visit (HOSPITAL_COMMUNITY): Payer: Self-pay

## 2021-11-27 DIAGNOSIS — N189 Chronic kidney disease, unspecified: Secondary | ICD-10-CM | POA: Diagnosis not present

## 2021-11-27 DIAGNOSIS — M109 Gout, unspecified: Secondary | ICD-10-CM | POA: Diagnosis not present

## 2021-11-27 DIAGNOSIS — M1990 Unspecified osteoarthritis, unspecified site: Secondary | ICD-10-CM | POA: Diagnosis not present

## 2021-11-27 DIAGNOSIS — E785 Hyperlipidemia, unspecified: Secondary | ICD-10-CM | POA: Diagnosis not present

## 2021-11-27 DIAGNOSIS — I130 Hypertensive heart and chronic kidney disease with heart failure and stage 1 through stage 4 chronic kidney disease, or unspecified chronic kidney disease: Secondary | ICD-10-CM | POA: Diagnosis not present

## 2021-11-27 DIAGNOSIS — E1122 Type 2 diabetes mellitus with diabetic chronic kidney disease: Secondary | ICD-10-CM | POA: Diagnosis not present

## 2021-11-27 DIAGNOSIS — I5033 Acute on chronic diastolic (congestive) heart failure: Secondary | ICD-10-CM | POA: Diagnosis not present

## 2021-11-27 DIAGNOSIS — I480 Paroxysmal atrial fibrillation: Secondary | ICD-10-CM | POA: Diagnosis not present

## 2021-11-27 DIAGNOSIS — J441 Chronic obstructive pulmonary disease with (acute) exacerbation: Secondary | ICD-10-CM | POA: Diagnosis not present

## 2021-12-02 DIAGNOSIS — H6123 Impacted cerumen, bilateral: Secondary | ICD-10-CM | POA: Diagnosis not present

## 2021-12-26 DIAGNOSIS — I10 Essential (primary) hypertension: Secondary | ICD-10-CM | POA: Diagnosis not present

## 2021-12-26 DIAGNOSIS — I5032 Chronic diastolic (congestive) heart failure: Secondary | ICD-10-CM | POA: Diagnosis not present

## 2021-12-26 DIAGNOSIS — R339 Retention of urine, unspecified: Secondary | ICD-10-CM | POA: Diagnosis not present

## 2021-12-26 DIAGNOSIS — I701 Atherosclerosis of renal artery: Secondary | ICD-10-CM | POA: Diagnosis not present

## 2021-12-26 DIAGNOSIS — I110 Hypertensive heart disease with heart failure: Secondary | ICD-10-CM | POA: Diagnosis not present

## 2021-12-31 DIAGNOSIS — M1990 Unspecified osteoarthritis, unspecified site: Secondary | ICD-10-CM | POA: Diagnosis not present

## 2021-12-31 DIAGNOSIS — I5033 Acute on chronic diastolic (congestive) heart failure: Secondary | ICD-10-CM | POA: Diagnosis not present

## 2021-12-31 DIAGNOSIS — J449 Chronic obstructive pulmonary disease, unspecified: Secondary | ICD-10-CM | POA: Diagnosis not present

## 2021-12-31 DIAGNOSIS — M109 Gout, unspecified: Secondary | ICD-10-CM | POA: Diagnosis not present

## 2021-12-31 DIAGNOSIS — E1122 Type 2 diabetes mellitus with diabetic chronic kidney disease: Secondary | ICD-10-CM | POA: Diagnosis not present

## 2021-12-31 DIAGNOSIS — N189 Chronic kidney disease, unspecified: Secondary | ICD-10-CM | POA: Diagnosis not present

## 2021-12-31 DIAGNOSIS — I130 Hypertensive heart and chronic kidney disease with heart failure and stage 1 through stage 4 chronic kidney disease, or unspecified chronic kidney disease: Secondary | ICD-10-CM | POA: Diagnosis not present

## 2021-12-31 DIAGNOSIS — L89320 Pressure ulcer of left buttock, unstageable: Secondary | ICD-10-CM | POA: Diagnosis not present

## 2021-12-31 DIAGNOSIS — I480 Paroxysmal atrial fibrillation: Secondary | ICD-10-CM | POA: Diagnosis not present

## 2022-01-01 DIAGNOSIS — N400 Enlarged prostate without lower urinary tract symptoms: Secondary | ICD-10-CM | POA: Diagnosis not present

## 2022-01-01 DIAGNOSIS — N281 Cyst of kidney, acquired: Secondary | ICD-10-CM | POA: Diagnosis not present

## 2022-01-01 DIAGNOSIS — R339 Retention of urine, unspecified: Secondary | ICD-10-CM | POA: Diagnosis not present

## 2022-01-01 DIAGNOSIS — I700 Atherosclerosis of aorta: Secondary | ICD-10-CM | POA: Diagnosis not present

## 2022-01-09 ENCOUNTER — Other Ambulatory Visit: Payer: Self-pay | Admitting: Physical Medicine and Rehabilitation

## 2022-01-14 DIAGNOSIS — E1122 Type 2 diabetes mellitus with diabetic chronic kidney disease: Secondary | ICD-10-CM | POA: Diagnosis not present

## 2022-01-14 DIAGNOSIS — I5033 Acute on chronic diastolic (congestive) heart failure: Secondary | ICD-10-CM | POA: Diagnosis not present

## 2022-01-14 DIAGNOSIS — I130 Hypertensive heart and chronic kidney disease with heart failure and stage 1 through stage 4 chronic kidney disease, or unspecified chronic kidney disease: Secondary | ICD-10-CM | POA: Diagnosis not present

## 2022-01-14 DIAGNOSIS — N189 Chronic kidney disease, unspecified: Secondary | ICD-10-CM | POA: Diagnosis not present

## 2022-01-20 ENCOUNTER — Telehealth: Payer: Self-pay | Admitting: Cardiology

## 2022-01-20 NOTE — Telephone Encounter (Signed)
Calling to report symptoms lower extremity swelling, fatigue, and patient decided to double dosage of his lasix. Please advise

## 2022-01-21 NOTE — Telephone Encounter (Signed)
Called Colletta [place] from [facility] and she reported that the patient had +2 pitting edema in his lower extremities and he was very weak and fatigued. His weight yesterday was 163 and today it is 165.4. Also the patient had taken it upon himself to double his dosage of Lasix that he was taking.

## 2022-01-22 DIAGNOSIS — N189 Chronic kidney disease, unspecified: Secondary | ICD-10-CM | POA: Diagnosis not present

## 2022-01-22 DIAGNOSIS — I5033 Acute on chronic diastolic (congestive) heart failure: Secondary | ICD-10-CM | POA: Diagnosis not present

## 2022-01-22 DIAGNOSIS — E1122 Type 2 diabetes mellitus with diabetic chronic kidney disease: Secondary | ICD-10-CM | POA: Diagnosis not present

## 2022-01-22 DIAGNOSIS — I130 Hypertensive heart and chronic kidney disease with heart failure and stage 1 through stage 4 chronic kidney disease, or unspecified chronic kidney disease: Secondary | ICD-10-CM | POA: Diagnosis not present

## 2022-01-27 DIAGNOSIS — I480 Paroxysmal atrial fibrillation: Secondary | ICD-10-CM | POA: Diagnosis not present

## 2022-01-27 DIAGNOSIS — M109 Gout, unspecified: Secondary | ICD-10-CM | POA: Diagnosis not present

## 2022-01-27 DIAGNOSIS — E1122 Type 2 diabetes mellitus with diabetic chronic kidney disease: Secondary | ICD-10-CM | POA: Diagnosis not present

## 2022-01-27 DIAGNOSIS — I130 Hypertensive heart and chronic kidney disease with heart failure and stage 1 through stage 4 chronic kidney disease, or unspecified chronic kidney disease: Secondary | ICD-10-CM | POA: Diagnosis not present

## 2022-01-27 DIAGNOSIS — J449 Chronic obstructive pulmonary disease, unspecified: Secondary | ICD-10-CM | POA: Diagnosis not present

## 2022-01-27 DIAGNOSIS — L89320 Pressure ulcer of left buttock, unstageable: Secondary | ICD-10-CM | POA: Diagnosis not present

## 2022-01-27 DIAGNOSIS — M1990 Unspecified osteoarthritis, unspecified site: Secondary | ICD-10-CM | POA: Diagnosis not present

## 2022-01-27 DIAGNOSIS — N189 Chronic kidney disease, unspecified: Secondary | ICD-10-CM | POA: Diagnosis not present

## 2022-01-27 DIAGNOSIS — I5033 Acute on chronic diastolic (congestive) heart failure: Secondary | ICD-10-CM | POA: Diagnosis not present

## 2022-01-29 DIAGNOSIS — L729 Follicular cyst of the skin and subcutaneous tissue, unspecified: Secondary | ICD-10-CM | POA: Diagnosis not present

## 2022-01-29 DIAGNOSIS — L0231 Cutaneous abscess of buttock: Secondary | ICD-10-CM | POA: Diagnosis not present

## 2022-02-03 ENCOUNTER — Telehealth: Payer: Self-pay | Admitting: Cardiology

## 2022-02-03 NOTE — Telephone Encounter (Signed)
Pt c/o swelling: STAT is pt has developed SOB within 24 hours  How much weight have you gained and in what time span? Not sure   If swelling, where is the swelling located? Feet and legs swelling   Are you currently taking a fluid pill? Yes   Are you currently SOB? No   Do you have a log of your daily weights (if so, list)?   Have you gained 3 pounds in a day or 5 pounds in a week? Not sure.   Have you traveled recently? No

## 2022-02-03 NOTE — Telephone Encounter (Signed)
Pt states that he gains a pond and loses it by the next day. Pt states he does not have any more shortness of breath than he always had. Pt advised to make sure he decreases the sodium in his diet, elevate his legs and wear compression socks. Pt verbalized understanding and had no additional questions. Will route to Dr. Bettina Gavia.

## 2022-02-04 NOTE — Telephone Encounter (Signed)
Left vm for pt to callback 

## 2022-02-04 NOTE — Telephone Encounter (Signed)
Recommendations reviewed with pt as per Dr. Munley's note.  Pt verbalized understanding and had no additional questions.  

## 2022-02-06 ENCOUNTER — Ambulatory Visit: Payer: Medicare HMO | Admitting: Physician Assistant

## 2022-02-07 NOTE — Telephone Encounter (Signed)
See most recent office note.

## 2022-02-13 ENCOUNTER — Encounter: Payer: Self-pay | Admitting: Physician Assistant

## 2022-02-13 ENCOUNTER — Ambulatory Visit: Payer: Medicare HMO | Attending: Physician Assistant | Admitting: Physician Assistant

## 2022-02-13 VITALS — BP 112/68 | HR 68 | Ht 67.0 in | Wt 165.2 lb

## 2022-02-13 DIAGNOSIS — R600 Localized edema: Secondary | ICD-10-CM | POA: Diagnosis not present

## 2022-02-13 DIAGNOSIS — E782 Mixed hyperlipidemia: Secondary | ICD-10-CM

## 2022-02-13 DIAGNOSIS — Z7901 Long term (current) use of anticoagulants: Secondary | ICD-10-CM

## 2022-02-13 DIAGNOSIS — I119 Hypertensive heart disease without heart failure: Secondary | ICD-10-CM | POA: Diagnosis not present

## 2022-02-13 DIAGNOSIS — I4821 Permanent atrial fibrillation: Secondary | ICD-10-CM | POA: Diagnosis not present

## 2022-02-13 MED ORDER — FUROSEMIDE 40 MG PO TABS
40.0000 mg | ORAL_TABLET | Freq: Every day | ORAL | 1 refills | Status: DC
Start: 2022-02-13 — End: 2022-12-04

## 2022-02-13 NOTE — Progress Notes (Signed)
Cardiology Office Note:    Date:  02/13/2022   ID:  Logan [name], DOB 1945/10/28, MRN [id number]  PCP:  [name], MD  [facility] Cardiologist:  Shirlee More, MD  [facility] Electrophysiologist:  Will Meredith Leeds, MD   Chief Complaint: swelling   History of Present Illness:    Logan [name] is a 76 y.o. male with a hx of permanent atrial fibrillation, hyperlipidemia, hypertensive heart disease, morbid obesity, diabetes, chronic diastolic congestive heart failure which added to my schedule for swelling.  History of atrial fibrillation with failed cardioversion.  S/p ablation November 2021.  Unfortunately had recurrent atrial fibrillation.  Attempted cardioversion on amiodarone but unsuccessful.  Due to symptomatic nature of atrial fibrillation, he underwent repeat cardioversion 09/2020 in EP lab by Dr. Curt Bears.  He is amiodarone discontinued due to concern for toxicity with gait dysfunction and muscle weakness.  Patient reported 1 month history of ankle/lower extremity edema.  Recently had urinary retention.  Work-up revealed cyst on his kidney.  Undergoing work-up.  Denies orthopnea, PND, syncope, chest pain, palpitation, dizziness or melena.  Further questioning, identified that patient is eating high salt diet.  Past Medical History:  Diagnosis Date   Anxiety    Atherosclerotic renal artery stenosis, unilateral (HCC) 02/28/2014   Atrial fibrillation (HCC)    Bilateral lower extremity edema    Cardiac arrhythmia due to congenital heart disease    Chronic diastolic heart failure ([place]) 10/21/2019   Diabetes mellitus without complication (HCC)    DVT (deep venous thrombosis) (HCC)    Dysrhythmia    Esophageal reflux    Essential hypertension 10/21/2019   Gout    H/O blood clots    Hyperlipidemia    Hypertension    Mixed hyperlipidemia 10/21/2019   Morbid obesity ([place])    Obesity (BMI 30-39.9) 08/24/2019   Orthopnea    Osteoarthritis    Persistent atrial  fibrillation ([place]) 10/21/2019   Pneumonia    Seasonal allergies    Sleep apnea     Past Surgical History:  Procedure Laterality Date   ABDOMINAL AORTAGRAM N/A 02/28/2014   Procedure: ABDOMINAL Maxcine Ham;  Surgeon: Serafina Mitchell, MD;  Location: [facility] CATH LAB;  Service: Cardiovascular;  Laterality: N/A;   ANTERIOR CERVICAL DECOMP/DISCECTOMY FUSION N/A 01/01/2021   Procedure: ACDF C3-4;  Surgeon: Dawley, Theodoro Doing, DO;  Location: [place];  Service: Neurosurgery;  Laterality: N/A;   APPENDECTOMY     ATRIAL FIBRILLATION ABLATION N/A 02/29/2020   Procedure: ATRIAL FIBRILLATION ABLATION;  Surgeon: Constance Haw, MD;  Location: [place] CV LAB;  Service: Cardiovascular;  Laterality: N/A;   CARDIOVERSION N/A 10/18/2020   Procedure: CARDIOVERSION (CATH LAB);  Surgeon: Constance Haw, MD;  Location: [place] CV LAB;  Service: Cardiovascular;  Laterality: N/A;   CARPAL TUNNEL RELEASE Left    TOTAL HIP ARTHROPLASTY Left 06/03/2021   Procedure: LEFT TOTAL HIP ARTHROPLASTY ANTERIOR APPROACH;  Surgeon: Frederik Pear, MD;  [facility];  Service: Orthopedics;  Laterality: Left;    Current Medications: Current Meds  Medication Sig   acetaminophen (TYLENOL) 500 MG tablet Take 1 tablet by mouth as needed.   allopurinol (ZYLOPRIM) 300 MG tablet Take 300 mg by mouth daily.   apixaban (ELIQUIS) 5 MG TABS tablet Take 1 tablet (5 mg total) by mouth 2 (two) times daily.   carvedilol (COREG) 12.5 MG tablet Take 12.5 mg by mouth 2 (two) times daily.   cyanocobalamin (VITAMIN B12) 1000 MCG tablet Take 1 tablet by mouth daily.  DHEA 25 MG CAPS Take 25 mg by mouth daily.   diclofenac Sodium (VOLTAREN) 1 % GEL Apply 2 g topically 4 (four) times daily. To left hip (Patient taking differently: Apply 2 g topically 4 (four) times daily as needed (pain). To left hip)   fluticasone (FLONASE) 50 MCG/ACT nasal spray Place 1 spray into both nostrils daily as needed for rhinitis.    ipratropium-albuterol  (DUONEB) 0.5-2.5 (3) MG/3ML SOLN Take 3 mLs by nebulization every 6 (six) hours as needed.   loratadine (CLARITIN) 10 MG tablet Take 10 mg by mouth daily as needed for allergies.   Menthol, Topical Analgesic, (ICY HOT EX) Apply 1 g topically daily as needed (pain).   Nystatin (GERHARDT'S BUTT CREAM) CREA Apply 1 application. topically daily.   omeprazole (PRILOSEC) 20 MG capsule Take 1 capsule by mouth as needed.   potassium chloride (KLOR-CON) 10 MEQ tablet Take 10 mEq by mouth 2 (two) times daily.   Probiotic Product (PROBIOTIC PO) Take 1 capsule by mouth in the morning and at bedtime.   traMADol (ULTRAM) 50 MG tablet Take 50 mg by mouth every 6 (six) hours as needed.   [DISCONTINUED] furosemide (LASIX) 40 MG tablet Take 1 tablet by mouth once daily     Allergies:   Trazodone and nefazodone   Social History   Socioeconomic History   Marital status: Widowed    Spouse name: Not on file   Number of children: 1   Years of education: Not on file   Highest education level: Not on file  Occupational History   Occupation: retired  Tobacco Use   Smoking status: Former   Smokeless tobacco: Former    Types: Nurse, children's Use: Never used  Substance and Sexual Activity   Alcohol use: Never   Drug use: Never   Sexual activity: Not Currently  Other Topics Concern   Not on file  Social History Narrative   Not on file   Social Determinants of Health   Financial Resource Strain: Not on file  Food Insecurity: Not on file  Transportation Needs: Not on file  Physical Activity: Not on file  Stress: Not on file  Social Connections: Not on file     Family History: The patient's family history includes Atrial fibrillation in his brother; Cancer in his father; Heart disease in his brother and mother. There is no history of Stomach cancer or Colon cancer.    ROS:   Please see the history of present illness.    All other systems reviewed and are negative.   EKGs/Labs/Other  Studies Reviewed:    The following studies were reviewed today:  Echo 05/2021 1. Left ventricular ejection fraction, by estimation, is 60 to 65%. The  left ventricle has normal function. The left ventricle has no regional  wall motion abnormalities. Left ventricular diastolic parameters are  indeterminate.   2. Right ventricular systolic function is normal. The right ventricular  size is normal. There is normal pulmonary artery systolic pressure.   3. The mitral valve is normal in structure. No evidence of mitral valve  regurgitation. No evidence of mitral stenosis.   4. The aortic valve was not well visualized. There is mild calcification  of the aortic valve. Aortic valve regurgitation is not visualized. No  aortic stenosis is present.   5. The inferior vena cava is normal in size with greater than 50%  respiratory variability, suggesting right atrial pressure of 3 mmHg.   EKG:  EKG  is  ordered today.  The ekg ordered today demonstrates atrial fibrillation  Recent Labs: 06/13/2021: Magnesium 2.2 09/12/2021: ALT 23; B Natriuretic Peptide 233.9 09/20/2021: BUN 14; Creatinine, Ser 0.72; Potassium 3.8; Sodium 136 10/29/2021: Hemoglobin 12.4; Platelets 355.0  Recent Lipid Panel No results found for: "CHOL", "TRIG", "HDL", "CHOLHDL", "VLDL", "LDLCALC", "LDLDIRECT"  Physical Exam:    VS:  BP 112/68   Pulse 68   Ht '5\' 7"'$  (1.702 m)   Wt 165 lb 3.2 oz (74.9 kg)   SpO2 95%   BMI 25.87 kg/m     Wt Readings from Last 3 Encounters:  02/13/22 165 lb 3.2 oz (74.9 kg)  10/29/21 170 lb (77.1 kg)  09/20/21 167 lb 12.3 oz (76.1 kg)     GEN:  Well nourished, well developed in no acute distress HEENT: Normal NECK: No JVD; No carotid bruits LYMPHATICS: No lymphadenopathy CARDIAC: Irregularly irregular, no murmurs, rubs, gallops RESPIRATORY:  Clear to auscultation without rales, wheezing or rhonchi  ABDOMEN: Soft, non-tender, non-distended MUSCULOSKELETAL: + edema; No deformity  SKIN: Warm  and dry NEUROLOGIC:  Alert and oriented x 3 PSYCHIATRIC:  Normal affect   ASSESSMENT AND PLAN:    Lower extremity edema with presumed diastolic dysfunction -I think his lower extremity edema is due to high salt diet rather than heart failure.  Will check labs.  Recommended leg elevation, compression stocking and no more than 2 g of salt per day.  He has no orthopnea, dyspnea or PND. -Increase Lasix for few days  2.  Permanent atrial fibrillation Failed cardioversion.  Amiodarone discontinued due to toxicity.  Rate control. Continue carvedilol 12.5 mg twice daily and Eliquis 5 mg twice daily. No bleeding issue.  3.  Hypertension -Blood pressure stable and controlled on current medication.  Medication Adjustments/Labs and Tests Ordered: Current medicines are reviewed at length with the patient today.  Concerns regarding medicines are outlined above.  Orders Placed This Encounter  Procedures   Pro b natriuretic peptide (BNP)[facility])   Basic Metabolic Panel (BMET)   EKG 12-Lead   Meds ordered this encounter  Medications   furosemide (LASIX) 40 MG tablet    Sig: Take 1 tablet (40 mg total) by mouth daily. YOU MAY TAKE AN ADDITIONAL TABLET DAILY AS NEEDED    Dispense:  90 tablet    Refill:  1    Patient Instructions  Medication Instructions:  INCREASE Lasix to '40mg'$  Take 1 tablet twice a day for 4 days then take 1 tablet daily; you may take an additional tablet daily for additional swelling  *If you need a refill on your cardiac medications before your next appointment, please call your pharmacy*   Lab Work: [place] If you have labs (blood work) drawn today and your tests are completely normal, you will receive your results only by: [place] (if you have MyChart) OR A paper copy in the mail If you have any lab test that is abnormal or we need to change your treatment, we will call you to review the  results.   Testing/Procedures: NONE ORDERED   Follow-Up: At [facility], you and your health needs are our priority.  As part of our continuing mission to provide you with exceptional heart care, we have created designated Provider Care Teams.  These Care Teams include your primary Cardiologist (physician) and Advanced Practice Providers (APPs -  Physician Assistants and Nurse Practitioners) who all work together to provide you with the care you need, when you  need it.  We recommend signing up for the patient portal called "MyChart".  Sign up information is provided on this After Visit Summary.  MyChart is used to connect with patients for Virtual Visits (Telemedicine).  Patients are able to view lab/test results, encounter notes, upcoming appointments, etc.  Non-urgent messages can be sent to your provider as well.   To learn more about what you can do with MyChart, go to [email or web address].    Your next appointment:   1 month(s)  The format for your next appointment:   In Person  Provider:   Shirlee More, MD    Other Instructions DO NOT CONSUME MORE THAN '2000MG'$  OF SALT PER DAY  Important Information About Sugar         Jarrett Soho, PA  02/13/2022 8:34 AM    [place]

## 2022-02-13 NOTE — Patient Instructions (Addendum)
Medication Instructions:  INCREASE Lasix to '40mg'$  Take 1 tablet twice a day for 4 days then take 1 tablet daily; you may take an additional tablet daily for additional swelling  *If you need a refill on your cardiac medications before your next appointment, please call your pharmacy*   Lab Work: [place] If you have labs (blood work) drawn today and your tests are completely normal, you will receive your results only by: [place] (if you have MyChart) OR A paper copy in the mail If you have any lab test that is abnormal or we need to change your treatment, we will call you to review the results.   Testing/Procedures: NONE ORDERED   Follow-Up: At [facility], you and your health needs are our priority.  As part of our continuing mission to provide you with exceptional heart care, we have created designated Provider Care Teams.  These Care Teams include your primary Cardiologist (physician) and Advanced Practice Providers (APPs -  Physician Assistants and Nurse Practitioners) who all work together to provide you with the care you need, when you need it.  We recommend signing up for the patient portal called "MyChart".  Sign up information is provided on this After Visit Summary.  MyChart is used to connect with patients for Virtual Visits (Telemedicine).  Patients are able to view lab/test results, encounter notes, upcoming appointments, etc.  Non-urgent messages can be sent to your provider as well.   To learn more about what you can do with MyChart, go to [email or web address].    Your next appointment:   1 month(s)  The format for your next appointment:   In Person  Provider:   Shirlee More, MD    Other Instructions DO NOT CONSUME MORE THAN '2000MG'$  OF SALT PER DAY  Important Information About Sugar

## 2022-02-15 LAB — BASIC METABOLIC PANEL
BUN/Creatinine Ratio: 30 — ABNORMAL HIGH (ref 10–24)
BUN: 18 mg/dL (ref 8–27)
CO2: 28 mmol/L (ref 20–29)
Calcium: 10.4 mg/dL — ABNORMAL HIGH (ref 8.6–10.2)
Chloride: 102 mmol/L (ref 96–106)
Creatinine, Ser: 0.6 mg/dL — ABNORMAL LOW (ref 0.76–1.27)
Glucose: 108 mg/dL — ABNORMAL HIGH (ref 70–99)
Potassium: 4.3 mmol/L (ref 3.5–5.2)
Sodium: 145 mmol/L — ABNORMAL HIGH (ref 134–144)
eGFR: 101 mL/min/{1.73_m2} (ref >59)

## 2022-02-15 LAB — PRO B NATRIURETIC PEPTIDE: NT-Pro BNP: 1238 pg/mL — ABNORMAL HIGH (ref 0–376)

## 2022-02-24 DIAGNOSIS — R339 Retention of urine, unspecified: Secondary | ICD-10-CM | POA: Diagnosis not present

## 2022-02-24 DIAGNOSIS — N138 Other obstructive and reflux uropathy: Secondary | ICD-10-CM | POA: Diagnosis not present

## 2022-02-24 DIAGNOSIS — N401 Enlarged prostate with lower urinary tract symptoms: Secondary | ICD-10-CM | POA: Diagnosis not present

## 2022-02-24 DIAGNOSIS — N281 Cyst of kidney, acquired: Secondary | ICD-10-CM | POA: Diagnosis not present

## 2022-03-04 DIAGNOSIS — S80821A Blister (nonthermal), right lower leg, initial encounter: Secondary | ICD-10-CM | POA: Diagnosis not present

## 2022-03-10 DIAGNOSIS — J4520 Mild intermittent asthma, uncomplicated: Secondary | ICD-10-CM | POA: Diagnosis not present

## 2022-03-10 DIAGNOSIS — Z23 Encounter for immunization: Secondary | ICD-10-CM | POA: Diagnosis not present

## 2022-03-10 DIAGNOSIS — Q433 Congenital malformations of intestinal fixation: Secondary | ICD-10-CM | POA: Diagnosis not present

## 2022-03-11 DIAGNOSIS — Z86718 Personal history of other venous thrombosis and embolism: Secondary | ICD-10-CM | POA: Insufficient documentation

## 2022-03-11 DIAGNOSIS — I499 Cardiac arrhythmia, unspecified: Secondary | ICD-10-CM | POA: Insufficient documentation

## 2022-03-25 ENCOUNTER — Ambulatory Visit: Payer: Medicare HMO | Admitting: Cardiology

## 2022-05-01 NOTE — Progress Notes (Signed)
Cardiology Office Note:    Date:  05/02/2022   ID:  Logan [name], DOB 1945-11-13, MRN [id number]  PCP:  [name], MD  Cardiologist:  Shirlee More, MD    Referring MD: [name], MD    ASSESSMENT:    1. Permanent atrial fibrillation ([place])   2. Chronic anticoagulation   3. Hypertensive heart disease with chronic diastolic congestive heart failure ([place])   4. Bilateral lower extremity edema    PLAN:    In order of problems listed above:  Felis is functionally improved off amiodarone in retrospect I think he did have neuro toxicity.  For now we will continue rate control beta-blocker his current anticoagulant. Well-controlled he has benefit from a loop diuretic continue the same   Next appointment: 6 months   Medication Adjustments/Labs and Tests Ordered: Current medicines are reviewed at length with the patient today.  Concerns regarding medicines are outlined above.  No orders of the defined types were placed in this encounter.  No orders of the defined types were placed in this encounter.   Chief complaint follow-up atrial fibrillation   History of Present Illness:    Logan [name] is a 77 y.o. male with a hx of permanent atrial fibrillation hypertensive heart disease with chronic diastolic heart failure hyperlipidemia and type 2 diabetes last seen 02/13/2022.  He was amiodarone intolerant due to gait dysfunction and muscle weakness.  He was having lower extremity edema and with suspicion of heart failure proBNP level was performed and found to be quite elevated 1238.  Compliance with diet, lifestyle and medications: Yes  His son is present participates in evaluation decision making.  His father has had a hard time .He Had Hip Surgery Afterwards He Had COVID Infection and Then Developed C. difficile Colitis with Recurrent Admission to the Hospital and Recurrent Rehab Stay. He is better off amiodarone and his gait is improved strength is better no falls  and he is having less edema He inquires if he has heart failure I told him that the best description is that he is swelling in his legs and his benefit from taking a diuretic Not short of breath no chest pain palpitation or syncope He tells [place] his anticoagulant without bleeding Home blood pressure is runs in the range of 1 06-2 20 systolic and heart rate 70 to 80 bpm.  I have asked him in the future to write numbers down record and bring them to office visits Past Medical History:  Diagnosis Date   Anxiety    Atherosclerotic renal artery stenosis, unilateral ([place]) 02/28/2014   Atrial fibrillation ([place])    Bilateral lower extremity edema    Cardiac arrhythmia due to congenital heart disease    Chronic diastolic heart failure ([place]) 10/21/2019   Diabetes mellitus without complication ([place])    DVT (deep venous thrombosis) ([place])    Dysrhythmia    Esophageal reflux    Essential hypertension 10/21/2019   Gout    H/O blood clots    Hyperlipidemia    Hypertension    Mixed hyperlipidemia 10/21/2019   Morbid obesity ([place])    Obesity (BMI 30-39.9) 08/24/2019   Orthopnea    Osteoarthritis    Persistent atrial fibrillation ([place]) 10/21/2019   Pneumonia    Seasonal allergies    Sleep apnea     Past Surgical History:  Procedure Laterality Date   ABDOMINAL AORTAGRAM N/A 02/28/2014   Procedure: ABDOMINAL Maxcine Ham;  Surgeon: Serafina Mitchell, MD;  Location: [facility] CATH LAB;  Service:  Cardiovascular;  Laterality: N/A;   ANTERIOR CERVICAL DECOMP/DISCECTOMY FUSION N/A 01/01/2021   Procedure: ACDF C3-4;  Surgeon: Dawley, Theodoro Doing, DO;  Location: [place];  Service: Neurosurgery;  Laterality: N/A;   APPENDECTOMY     ATRIAL FIBRILLATION ABLATION N/A 02/29/2020   Procedure: ATRIAL FIBRILLATION ABLATION;  Surgeon: Constance Haw, MD;  Location: [place] CV LAB;  Service: Cardiovascular;  Laterality: N/A;   CARDIOVERSION N/A 10/18/2020   Procedure: CARDIOVERSION (CATH LAB);  Surgeon: Constance Haw, MD;  Location: [place] CV LAB;  Service: Cardiovascular;  Laterality: N/A;   CARPAL TUNNEL RELEASE Left    TOTAL HIP ARTHROPLASTY Left 06/03/2021   Procedure: LEFT TOTAL HIP ARTHROPLASTY ANTERIOR APPROACH;  Surgeon: Frederik Pear, MD;  [facility];  Service: Orthopedics;  Laterality: Left;    Current Medications: Current Meds  Medication Sig   acetaminophen (TYLENOL) 500 MG tablet Take 1 tablet by mouth as needed.   allopurinol (ZYLOPRIM) 300 MG tablet Take 300 mg by mouth daily.   apixaban (ELIQUIS) 5 MG TABS tablet Take 1 tablet (5 mg total) by mouth 2 (two) times daily.   carvedilol (COREG) 12.5 MG tablet Take 12.5 mg by mouth 2 (two) times daily.   cyanocobalamin (VITAMIN B12) 1000 MCG tablet Take 1 tablet by mouth daily.   DHEA 25 MG CAPS Take 25 mg by mouth daily.   diclofenac Sodium (VOLTAREN) 1 % GEL Apply 2 g topically 4 (four) times daily. To left hip (Patient taking differently: Apply 2 g topically 4 (four) times daily as needed (pain). To left hip)   fluticasone (FLONASE) 50 MCG/ACT nasal spray Place 1 spray into both nostrils daily as needed for rhinitis.    furosemide (LASIX) 40 MG tablet Take 1 tablet (40 mg total) by mouth daily. YOU MAY TAKE AN ADDITIONAL TABLET DAILY AS NEEDED   ipratropium-albuterol (DUONEB) 0.5-2.5 (3) MG/3ML SOLN Take 3 mLs by nebulization every 6 (six) hours as needed.   loratadine (CLARITIN) 10 MG tablet Take 10 mg by mouth daily as needed for allergies.   losartan (COZAAR) 100 MG tablet Take 100 mg by mouth daily.   Menthol, Topical Analgesic, (ICY HOT EX) Apply 1 g topically daily as needed (pain).   Nystatin (GERHARDT'S BUTT CREAM) CREA Apply 1 application. topically daily.   omeprazole (PRILOSEC) 20 MG capsule Take 1 capsule by mouth as needed.   potassium chloride (KLOR-CON) 10 MEQ tablet Take 10 mEq by mouth 2 (two) times daily.   Probiotic Product (PROBIOTIC PO) Take 1 capsule by mouth in the morning and at bedtime.   traMADol  (ULTRAM) 50 MG tablet Take 50 mg by mouth every 6 (six) hours as needed.     Allergies:   Amiodarone and Trazodone and nefazodone   Social History   Socioeconomic History   Marital status: Widowed    Spouse name: Not on file   Number of children: 1   Years of education: Not on file   Highest education level: Not on file  Occupational History   Occupation: retired  Tobacco Use   Smoking status: Former   Smokeless tobacco: Former    Types: Nurse, children's Use: Never used  Substance and Sexual Activity   Alcohol use: Never   Drug use: Never   Sexual activity: Not Currently  Other Topics Concern   Not on file  Social History Narrative   Not on file   Social Determinants of Health   Financial Resource Strain:  Not on file  Food Insecurity: Not on file  Transportation Needs: Not on file  Physical Activity: Not on file  Stress: Not on file  Social Connections: Not on file     Family History: The patient's family history includes Atrial fibrillation in his brother; Cancer in his father; Heart disease in his brother and mother. There is no history of Stomach cancer or Colon cancer. ROS:   Please see the history of present illness.    All other systems reviewed and are negative.  EKGs/Labs/Other Studies Reviewed:    The following studies were reviewed today:   Recent Labs: 06/13/2021: Magnesium 2.2 09/12/2021: ALT 23; B Natriuretic Peptide 233.9 10/29/2021: Hemoglobin 12.4; Platelets 355.0 02/13/2022: BUN 18; Creatinine, Ser 0.60; NT-Pro BNP 1,238; Potassium 4.3; Sodium 145  Recent Lipid Panel No results found for: "CHOL", "TRIG", "HDL", "CHOLHDL", "VLDL", "LDLCALC", "LDLDIRECT"  Physical Exam:    VS:  BP 124/60 (BP Location: Left Arm, Patient Position: Sitting)   Pulse 70   Ht '5\' 7"'$  (1.702 m)   Wt 176 lb (79.8 kg)   SpO2 99%   BMI 27.57 kg/m     Wt Readings from Last 3 Encounters:  05/02/22 176 lb (79.8 kg)  02/13/22 165 lb 3.2 oz (74.9 kg)   10/29/21 170 lb (77.1 kg)     GEN:  Well nourished, well developed in no acute distress HEENT: Normal NECK: No JVD; No carotid bruits LYMPHATICS: No lymphadenopathy CARDIAC: Irregular rhythm variable first heart sound  RESPIRATORY:  Clear to auscultation without rales, wheezing or rhonchi  ABDOMEN: Soft, non-tender, non-distended MUSCULOSKELETAL:  No edema; No deformity  SKIN: Warm and dry NEUROLOGIC:  Alert and oriented x 3 PSYCHIATRIC:  Normal affect    Signed, Shirlee More, MD  05/02/2022 12:46 PM    [place]

## 2022-05-02 ENCOUNTER — Encounter: Payer: Self-pay | Admitting: Cardiology

## 2022-05-02 ENCOUNTER — Ambulatory Visit: Payer: Medicare HMO | Attending: Cardiology | Admitting: Cardiology

## 2022-05-02 VITALS — BP 124/60 | HR 70 | Ht 67.0 in | Wt 176.0 lb

## 2022-05-02 DIAGNOSIS — I4821 Permanent atrial fibrillation: Secondary | ICD-10-CM

## 2022-05-02 DIAGNOSIS — R600 Localized edema: Secondary | ICD-10-CM

## 2022-05-02 DIAGNOSIS — Z7901 Long term (current) use of anticoagulants: Secondary | ICD-10-CM | POA: Diagnosis not present

## 2022-05-02 DIAGNOSIS — I110 Hypertensive heart disease with heart failure: Secondary | ICD-10-CM

## 2022-05-02 DIAGNOSIS — I5032 Chronic diastolic (congestive) heart failure: Secondary | ICD-10-CM

## 2022-05-02 NOTE — Patient Instructions (Signed)
Medication Instructions:  Your physician recommends that you continue on your current medications as directed. Please refer to the Current Medication list given to you today.  *If you need a refill on your cardiac medications before your next appointment, please call your pharmacy*   Lab Work: None If you have labs (blood work) drawn today and your tests are completely normal, you will receive your results only by: MyChart Message (if you have MyChart) OR A paper copy in the mail If you have any lab test that is abnormal or we need to change your treatment, we will call you to review the results.   Testing/Procedures: None   Follow-Up: At [facility], you and your health needs are our priority.  As part of our continuing mission to provide you with exceptional heart care, we have created designated Provider Care Teams.  These Care Teams include your primary Cardiologist (physician) and Advanced Practice Providers (APPs -  Physician Assistants and Nurse Practitioners) who all work together to provide you with the care you need, when you need it.  We recommend signing up for the patient portal called "MyChart".  Sign up information is provided on this After Visit Summary.  MyChart is used to connect with patients for Virtual Visits (Telemedicine).  Patients are able to view lab/test results, encounter notes, upcoming appointments, etc.  Non-urgent messages can be sent to your provider as well.   To learn more about what you can do with MyChart, go to [email or web address].    Your next appointment:   6 month(s)  Provider:   Brian Munley, MD    Other Instructions None  

## 2022-06-20 DIAGNOSIS — K222 Esophageal obstruction: Secondary | ICD-10-CM | POA: Insufficient documentation

## 2022-06-20 HISTORY — DX: Esophageal obstruction: K22.2

## 2022-07-31 ENCOUNTER — Telehealth: Payer: Self-pay

## 2022-07-31 NOTE — Telephone Encounter (Signed)
Dr. Dulce Sellar,   You saw this patient on 05/02/2022. Will you please comment on clearance for EGD?  Please route your response to P CV DIV Preop. I will communicate with requesting office once you have given recommendations.   Thank you!  Carlos Levering, NP

## 2022-07-31 NOTE — Telephone Encounter (Signed)
   Name: Logan [name]  DOB: [birth year]  MRN: [id number]   Primary Cardiologist: Norman Herrlich, MD  Chart reviewed as part of pre-operative protocol coverage. Logan [name] was last seen on 05/02/2022 by Dr. Dulce Sellar.  Per correspondence with Dr. Dulce Sellar: "He is optimized for planned GI endoscopy."  Therefore, based on ACC/AHA guidelines, the patient would be at acceptable risk for the planned procedure without further cardiovascular testing.   Per Pharm.D.: Patient with diagnosis of afib and VTE on Eliquis for anticoagulation.     Procedure: EGD Date of procedure: TBD   CHA2DS2-VASc Score = 5  This indicates a 7.2% annual risk of stroke. The patient's score is based upon: CHF History: 1 HTN History: 1 Diabetes History: 1 Stroke History: 0 Vascular Disease History: 1 Age Score: 1 Gender Score: 0   Prior remote DVT. Then had provoked PE 05/2021 in setting of surgery when holding Eliquis.   CrCl >155mL/min Platelet count 326K   Recommend shorter hold time for Eliquis of 1 day prior to procedure as pt had a PE 1 year ago when he held Eliquis periprocedurally. He should resume anticoag as soon as safely possible after.  I will route this recommendation to the requesting party via Epic fax function and remove from pre-op pool. Please call with questions.  Carlos Levering, NP 07/31/2022, 3:23 PM

## 2022-07-31 NOTE — Telephone Encounter (Signed)
   Pre-operative Risk Assessment    Patient Name: Logan [name]  DOB: 08-Oct-1945 MRN: [id number]      Request for Surgical Clearance    Procedure:   EGD  Date of Surgery:  Clearance TBD                                 Surgeon:  Dr Gevena Cotton Group or Practice Name:  [facility] Phone number:  [phone] Fax number:  [phone]   Type of Clearance Requested:   - Pharmacy:  Hold Apixaban (Eliquis) Eliquis   Type of Anesthesia:  Not Indicated   Additional requests/questions:  Please fax a copy of signed clearance request  to the surgeon's office.  Kathie Dike   07/31/2022, 6:51 AM

## 2022-07-31 NOTE — Telephone Encounter (Signed)
Patient with diagnosis of afib and VTE on Eliquis for anticoagulation.    Procedure: EGD Date of procedure: TBD  CHA2DS2-VASc Score = 5  This indicates a 7.2% annual risk of stroke. The patient's score is based upon: CHF History: 1 HTN History: 1 Diabetes History: 1 Stroke History: 0 Vascular Disease History: 1 Age Score: 1 Gender Score: 0  Prior remote DVT. Then had provoked PE 05/2021 in setting of surgery when holding Eliquis.  CrCl >176mL/min Platelet count 326K  Recommend shorter hold time for Eliquis of 1 day prior to procedure as pt had a PE 1 year ago when he held Eliquis periprocedurally. He should resume anticoag as soon as safely possible after.  **This guidance is not considered finalized until pre-operative APP has relayed final recommendations.**

## 2022-12-03 NOTE — Progress Notes (Unsigned)
Cardiology Office Note:    Date:  12/04/2022   ID:  Logan [name], DOB Jul 11, 1945, MRN [id number]  PCP:  Philemon Kingdom, Logan  Cardiologist:  Norman Herrlich, Logan    Referring Logan: Philemon Kingdom, Logan    ASSESSMENT:    1. Permanent atrial fibrillation (HCC)   2. Chronic anticoagulation   3. Hypertensive heart disease with chronic diastolic congestive heart failure (HCC)   4. Mixed hyperlipidemia    PLAN:    In order of problems listed above:  Rate is controlled to continue his beta-blocker at current anticoagulant Heart failure is mildly decompensated I think you should take an extra dose of diuretic 3 days a week and will improve the quality of his life resolve his edema and shortness of breath and continue his beta-blocker ARB with normal ejection fraction No longer on lipid-lowering treatment Recheck labs for safety CBC BMP and proBNP level Follow-up 6 months      Medication Adjustments/Labs and Tests Ordered: Current medicines are reviewed at length with the patient today.  Concerns regarding medicines are outlined above.  No orders of the defined types were placed in this encounter.  No orders of the defined types were placed in this encounter.    History of Present Illness:    Logan [name] is a 77 y.o. male with a hx of permanent atrial fibrillation failing amiodarone therapy and with neurotoxicity hypertensive heart disease with chronic diastolic heart failure hyperlipidemia type 2 diabetes last seen 05/02/2022.  Compliance with diet, lifestyle and medications: Yes   Recently notices intermittent edema and is a little bit more short of breath than usual Unaware of atrial fibrillation not having chest pain palpitation syncope he has had no bleeding from his anticoagulant Past Medical History:  Diagnosis Date   Anxiety    Atherosclerotic renal artery stenosis, unilateral (HCC) 02/28/2014   Atrial fibrillation (HCC)    Bilateral lower extremity edema    Cardiac  arrhythmia due to congenital heart disease    Chronic diastolic heart failure (HCC) 10/21/2019   Diabetes mellitus without complication (HCC)    DVT (deep venous thrombosis) (HCC)    Dysrhythmia    Esophageal reflux    Essential hypertension 10/21/2019   Gout    H/O blood clots    Hyperlipidemia    Hypertension    Mixed hyperlipidemia 10/21/2019   Morbid obesity (HCC)    Obesity (BMI 30-39.9) 08/24/2019   Orthopnea    Osteoarthritis    Persistent atrial fibrillation (HCC) 10/21/2019   Pneumonia    Seasonal allergies    Sleep apnea     Current Medications: Current Meds  Medication Sig   acetaminophen (TYLENOL) 500 MG tablet Take 1 tablet by mouth as needed.   allopurinol (ZYLOPRIM) 300 MG tablet Take 300 mg by mouth daily.   apixaban (ELIQUIS) 5 MG TABS tablet Take 1 tablet (5 mg total) by mouth 2 (two) times daily.   carvedilol (COREG) 12.5 MG tablet Take 12.5 mg by mouth 2 (two) times daily.   cyanocobalamin (VITAMIN B12) 1000 MCG tablet Take 1 tablet by mouth daily.   DHEA 25 MG CAPS Take 25 mg by mouth daily.   diclofenac Sodium (VOLTAREN) 1 % GEL Apply 2 g topically 4 (four) times daily. To left hip   fluticasone (FLONASE) 50 MCG/ACT nasal spray Place 1 spray into both nostrils daily as needed for rhinitis.    furosemide (LASIX) 40 MG tablet Take 1 tablet (40 mg total) by mouth daily. YOU MAY TAKE AN  ADDITIONAL TABLET DAILY AS NEEDED   ipratropium-albuterol (DUONEB) 0.5-2.5 (3) MG/3ML SOLN Take 3 mLs by nebulization every 6 (six) hours as needed.   loratadine (CLARITIN) 10 MG tablet Take 10 mg by mouth daily as needed for allergies.   losartan (COZAAR) 100 MG tablet Take 100 mg by mouth daily.   Menthol, Topical Analgesic, (ICY HOT EX) Apply 1 g topically daily as needed (pain).   Nystatin (GERHARDT'S BUTT CREAM) CREA Apply 1 application. topically daily.   omeprazole (PRILOSEC) 20 MG capsule Take 1 capsule by mouth as needed.   potassium chloride (KLOR-CON) 10 MEQ tablet  Take 10 mEq by mouth 2 (two) times daily.   Probiotic Product (PROBIOTIC PO) Take 1 capsule by mouth in the morning and at bedtime.   traMADol (ULTRAM) 50 MG tablet Take 50 mg by mouth every 6 (six) hours as needed.      EKGs/Labs/Other Studies Reviewed:    The following studies were reviewed today:         Recent Labs: 02/13/2022: BUN 18; Creatinine, Ser 0.60; NT-Pro BNP 1,238; Potassium 4.3; Sodium 145  Recent Lipid Panel No results found for: "CHOL", "TRIG", "HDL", "CHOLHDL", "VLDL", "LDLCALC", "LDLDIRECT"  Physical Exam:    VS:  BP 120/82 (BP Location: Left Arm, Patient Position: Sitting, Cuff Size: Normal)   Pulse (!) 59   Ht 5\' 7"  (1.702 m)   Wt 164 lb (74.4 kg)   SpO2 94%   BMI 25.69 kg/m     Wt Readings from Last 3 Encounters:  12/04/22 164 lb (74.4 kg)  05/02/22 176 lb (79.8 kg)  02/13/22 165 lb 3.2 oz (74.9 kg)     GEN: He looks quite frail poor muscle mass well nourished, well developed in no acute distress HEENT: Normal NECK: No JVD; No carotid bruits LYMPHATICS: No lymphadenopathy CARDIAC: Irregular rate and rhythm variable first heart sound RESPIRATORY:  Clear to auscultation without rales, wheezing or rhonchi  ABDOMEN: Soft, non-tender, non-distended MUSCULOSKELETAL:  No edema; No deformity  SKIN: Warm and dry NEUROLOGIC:  Alert and oriented x 3 PSYCHIATRIC:  Normal affect    Signed, Norman Herrlich, Logan  12/04/2022 1:21 PM    [facility]

## 2022-12-04 ENCOUNTER — Encounter: Payer: Self-pay | Admitting: Cardiology

## 2022-12-04 ENCOUNTER — Ambulatory Visit: Payer: Medicare HMO | Attending: Cardiology | Admitting: Cardiology

## 2022-12-04 VITALS — BP 120/82 | HR 59 | Ht 67.0 in | Wt 164.0 lb

## 2022-12-04 DIAGNOSIS — E782 Mixed hyperlipidemia: Secondary | ICD-10-CM

## 2022-12-04 DIAGNOSIS — I4821 Permanent atrial fibrillation: Secondary | ICD-10-CM

## 2022-12-04 DIAGNOSIS — Z7901 Long term (current) use of anticoagulants: Secondary | ICD-10-CM

## 2022-12-04 DIAGNOSIS — I110 Hypertensive heart disease with heart failure: Secondary | ICD-10-CM | POA: Diagnosis not present

## 2022-12-04 DIAGNOSIS — I5032 Chronic diastolic (congestive) heart failure: Secondary | ICD-10-CM

## 2022-12-04 MED ORDER — FUROSEMIDE 40 MG PO TABS: 40.0000 mg | ORAL_TABLET | Freq: Every day | ORAL | 3 refills | Status: DC

## 2022-12-04 NOTE — Patient Instructions (Signed)
Medication Instructions:  Your physician has recommended you make the following change in your medication:   START: Lasix 40 mg daily (Take an extra Furosemide in the afternoon on M - W - F)  *If you need a refill on your cardiac medications before your next appointment, please call your pharmacy*   Lab Work: Your physician recommends that you return for lab work in:   Labs today: CBC, BMP, Pro BNP  If you have labs (blood work) drawn today and your tests are completely normal, you will receive your results only by: MyChart Message (if you have MyChart) OR A paper copy in the mail If you have any lab test that is abnormal or we need to change your treatment, we will call you to review the results.   Testing/Procedures: None   Follow-Up: At [facility], you and your health needs are our priority.  As part of our continuing mission to provide you with exceptional heart care, we have created designated Provider Care Teams.  These Care Teams include your primary Cardiologist (physician) and Advanced Practice Providers (APPs -  Physician Assistants and Nurse Practitioners) who all work together to provide you with the care you need, when you need it.  We recommend signing up for the patient portal called "MyChart".  Sign up information is provided on this After Visit Summary.  MyChart is used to connect with patients for Virtual Visits (Telemedicine).  Patients are able to view lab/test results, encounter notes, upcoming appointments, etc.  Non-urgent messages can be sent to your provider as well.   To learn more about what you can do with MyChart, go to [email or web address].    Your next appointment:   6 month(s)  Provider:   Norman Herrlich, MD    Other Instructions None

## 2022-12-05 LAB — BASIC METABOLIC PANEL
BUN/Creatinine Ratio: 26 — ABNORMAL HIGH (ref 10–24)
BUN: 19 mg/dL (ref 8–27)
CO2: 29 mmol/L (ref 20–29)
Calcium: 9.5 mg/dL (ref 8.6–10.2)
Chloride: 97 mmol/L (ref 96–106)
Creatinine, Ser: 0.73 mg/dL — ABNORMAL LOW (ref 0.76–1.27)
Glucose: 79 mg/dL (ref 70–99)
Potassium: 4.3 mmol/L (ref 3.5–5.2)
Sodium: 142 mmol/L (ref 134–144)
eGFR: 95 mL/min/{1.73_m2} (ref >59)

## 2022-12-05 LAB — PRO B NATRIURETIC PEPTIDE: NT-Pro BNP: 699 pg/mL — ABNORMAL HIGH (ref 0–486)

## 2022-12-05 LAB — CBC
Hematocrit: 38.4 % (ref 37.5–51.0)
Hemoglobin: 12.6 g/dL — ABNORMAL LOW (ref 13.0–17.7)
MCH: 32.5 pg (ref 26.6–33.0)
MCHC: 32.8 g/dL (ref 31.5–35.7)
MCV: 99 fL — ABNORMAL HIGH (ref 79–97)
Platelets: 244 10*3/uL (ref 150–450)
RBC: 3.88 x10E6/uL — ABNORMAL LOW (ref 4.14–5.80)
RDW: 13.8 % (ref 11.6–15.4)
WBC: 9.3 10*3/uL (ref 3.4–10.8)

## 2023-03-03 DIAGNOSIS — J4489 Other specified chronic obstructive pulmonary disease: Secondary | ICD-10-CM | POA: Insufficient documentation

## 2023-06-17 ENCOUNTER — Encounter: Payer: Self-pay | Admitting: Cardiology

## 2023-06-17 NOTE — Progress Notes (Unsigned)
 Cardiology Office Note:    Date:  06/18/2023   ID:  Logan [name], DOB 1946/03/03, MRN [id number]  PCP:  Audie Pinto, FNP  Cardiologist:  Norman Herrlich, MD    Referring MD: No ref. provider found    ASSESSMENT:    1. Permanent atrial fibrillation (HCC)   2. Chronic anticoagulation   3. Hypertensive heart disease with chronic diastolic congestive heart failure (HCC)    PLAN:    In order of problems listed above:  Rate controlled, he is having more problems with edema heart failure will increase his loop diuretic 40 mg twice daily and reduce his carvedilol to avoid symptomatic orthostatic hypotension his rate is well-controlled Continue his current anticoagulant for recheck renal function today   Next appointment: 6 months   Medication Adjustments/Labs and Tests Ordered: Current medicines are reviewed at length with the patient today.  Concerns regarding medicines are outlined above.  Orders Placed This Encounter  Procedures   EKG 12-Lead   No orders of the defined types were placed in this encounter.    History of Present Illness:    Logan [name] is a 78 y.o. male with a hx of permanent atrial fibrillation having failed amiodarone therapy complicated with neurotoxicity hypertensive heart disease with chronic diastolic heart failure hyperlipidemia and type 2 diabetes last seen 12/04/2022. Recent labs 12/04/2022 hemoglobin 12.6 platelets 244,000 BMP creatinine 0.73 GFR 95 cc/min potassium 4.3 sodium 142 N-terminal proBNP level 699  Compliance with diet, lifestyle and medications: Yes  Overall he is doing well he notices increased edema his proBNP level was elevated in the summer we will go and increase his diuretic to twice daily He is now seeing [place]n [place] nurse practitioner as his PCP  No shortness of breath chest pain palpitation or syncope Past Medical History:  Diagnosis Date   Abnormal gait 03/04/2021   Acute on chronic heart failure (HCC) 11/05/2020   Acute  pulmonary embolism (HCC) 06/07/2021   Anxiety    Atherosclerotic renal artery stenosis, unilateral (HCC) 02/28/2014   Atrial fibrillation (HCC)    Bilateral lower extremity edema    Cardiac arrhythmia due to congenital heart disease    Cervical disc disease with myelopathy 01/03/2021   Cervical myelopathy (HCC) 01/01/2021   Cervical radiculopathy 11/05/2020   Chronic diastolic heart failure (HCC) 10/21/2019   Chronic incomplete quadriplegia (HCC) 03/04/2021   Chronic pain syndrome 03/04/2021   Closed coracoid process fracture 06/13/2021   Diabetes mellitus without complication (HCC)    DVT (deep venous thrombosis) (HCC)    Dysrhythmia    Esophageal reflux    Esophageal stricture 06/20/2022   Essential hypertension 10/21/2019   Gout    H/O blood clots    H/O total hip arthroplasty, left 06/03/2021   Hyperlipidemia    Hypertension    Hypoalbuminemia due to protein-calorie malnutrition (HCC)    Hypokalemia    Insomnia due to medical condition 03/04/2021   Loose stools    Macrocytic anemia    Mixed hyperlipidemia 10/21/2019   Morbid obesity (HCC)    Obesity (BMI 30-39.9) 08/24/2019   Orthopnea    Osteoarthritis    Persistent atrial fibrillation (HCC) 10/21/2019   Pneumonia    Prediabetes    Pressure injury of skin 09/13/2021   Primary osteoarthritis of left hip    Rectal bleeding    Rib fractures 06/13/2021   Seasonal allergies    Secondary hypercoagulable state (HCC) 03/28/2020   Shoulder subluxation, right 01/31/2021   Sleep apnea  Current Medications: Current Meds  Medication Sig   acetaminophen (TYLENOL) 500 MG tablet Take 1 tablet by mouth as needed.   allopurinol (ZYLOPRIM) 300 MG tablet Take 300 mg by mouth daily.   apixaban (ELIQUIS) 5 MG TABS tablet Take 1 tablet (5 mg total) by mouth 2 (two) times daily.   budesonide-formoterol (SYMBICORT) 160-4.5 MCG/ACT inhaler Inhale 1 puff into the lungs 2 (two) times daily.   carvedilol (COREG) 12.5 MG tablet Take  12.5 mg by mouth 2 (two) times daily.   cyanocobalamin (VITAMIN B12) 1000 MCG tablet Take 1 tablet by mouth daily.   DHEA 25 MG CAPS Take 25 mg by mouth daily.   diclofenac Sodium (VOLTAREN) 1 % GEL Apply 2 g topically 4 (four) times daily. To left hip   eszopiclone (LUNESTA) 2 MG TABS tablet Take 1 tablet by mouth daily.   fluticasone (FLONASE) 50 MCG/ACT nasal spray Place 1 spray into both nostrils daily as needed for rhinitis.    ipratropium-albuterol (DUONEB) 0.5-2.5 (3) MG/3ML SOLN Take 3 mLs by nebulization every 6 (six) hours as needed.   loratadine (CLARITIN) 10 MG tablet Take 10 mg by mouth daily as needed for allergies.   losartan (COZAAR) 100 MG tablet Take 100 mg by mouth daily.   Menthol, Topical Analgesic, (ICY HOT EX) Apply 1 g topically daily as needed (pain).   Nystatin (GERHARDT'S BUTT CREAM) CREA Apply 1 application. topically daily.   omeprazole (PRILOSEC) 20 MG capsule Take 1 capsule by mouth as needed.   potassium chloride (KLOR-CON) 10 MEQ tablet Take 10 mEq by mouth 2 (two) times daily.   Probiotic Product (PROBIOTIC PO) Take 1 capsule by mouth in the morning and at bedtime.   tamsulosin (FLOMAX) 0.4 MG CAPS capsule Take 1 capsule by mouth daily.   traMADol (ULTRAM) 50 MG tablet Take 50 mg by mouth every 6 (six) hours as needed.   [DISCONTINUED] furosemide (LASIX) 40 MG tablet Take 1 tablet (40 mg total) by mouth daily. Take an extra furosemide in the afternoon on M - W - F.      EKGs/Labs/Other Studies Reviewed:    The following studies were reviewed today:  Cardiac Studies & Procedures   ______________________________________________________________________________________________     ECHOCARDIOGRAM  ECHOCARDIOGRAM COMPLETE 06/12/2021  Narrative ECHOCARDIOGRAM REPORT    Patient Name:   TAHJAY [name] Date of Exam: 06/12/2021 Medical Rec #:  [id number]   Height:       67.0 in Accession #:    [id number]  Weight:       195.1 lb Date of Birth:  02/01/1946    BSA:          2.001 m Patient Age:    78 years    BP:           106/48 mmHg Patient Gender: M           HR:           68 bpm. Exam Location:  Inpatient  Procedure: 2D Echo  Indications:    Pulmonary embolism  History:        Patient has prior history of Echocardiogram examinations, most recent 09/13/2019. Arrythmias:Atrial Fibrillation; Risk Factors:Hypertension.  Sonographer:    Festus Barren Referring Phys: [phone] A CALDWELL POWELL JR  IMPRESSIONS   1. Left ventricular ejection fraction, by estimation, is 60 to 65%. The left ventricle has normal function. The left ventricle has no regional wall motion abnormalities. Left ventricular diastolic parameters are indeterminate. 2. Right ventricular systolic function is  normal. The right ventricular size is normal. There is normal pulmonary artery systolic pressure. 3. The mitral valve is normal in structure. No evidence of mitral valve regurgitation. No evidence of mitral stenosis. 4. The aortic valve was not well visualized. There is mild calcification of the aortic valve. Aortic valve regurgitation is not visualized. No aortic stenosis is present. 5. The inferior vena cava is normal in size with greater than 50% respiratory variability, suggesting right atrial pressure of 3 mmHg.  FINDINGS Left Ventricle: Left ventricular ejection fraction, by estimation, is 60 to 65%. The left ventricle has normal function. The left ventricle has no regional wall motion abnormalities. The left ventricular internal cavity size was normal in size. There is no left ventricular hypertrophy. Left ventricular diastolic parameters are indeterminate.  Right Ventricle: The right ventricular size is normal. No increase in right ventricular wall thickness. Right ventricular systolic function is normal. There is normal pulmonary artery systolic pressure. The tricuspid regurgitant velocity is 2.38 m/s, and with an assumed right atrial pressure of 5 mmHg, the  estimated right ventricular systolic pressure is 27.7 mmHg.  Left Atrium: Left atrial size was normal in size.  Right Atrium: Right atrial size was normal in size.  Pericardium: There is no evidence of pericardial effusion.  Mitral Valve: The mitral valve is normal in structure. No evidence of mitral valve regurgitation. No evidence of mitral valve stenosis.  Tricuspid Valve: The tricuspid valve is normal in structure. Tricuspid valve regurgitation is trivial. No evidence of tricuspid stenosis.  Aortic Valve: The aortic valve was not well visualized. There is mild calcification of the aortic valve. Aortic valve regurgitation is not visualized. No aortic stenosis is present. Aortic valve peak gradient measures 5.2 mmHg.  Pulmonic Valve: The pulmonic valve was normal in structure. Pulmonic valve regurgitation is not visualized. No evidence of pulmonic stenosis.  Aorta: The aortic root is normal in size and structure.  Venous: The inferior vena cava is normal in size with greater than 50% respiratory variability, suggesting right atrial pressure of 3 mmHg.  IAS/Shunts: No atrial level shunt detected by color flow Doppler.   LEFT VENTRICLE PLAX 2D LVIDd:         4.10 cm LVIDs:         2.20 cm LV PW:         0.90 cm LV IVS:        1.00 cm LVOT diam:     2.00 cm LV SV:         64 LV SV Index:   32 LVOT Area:     3.14 cm   RIGHT VENTRICLE             IVC RV Basal diam:  3.60 cm     IVC diam: 1.90 cm RV S prime:     13.70 cm/s TAPSE (M-mode): 1.6 cm  LEFT ATRIUM           Index        RIGHT ATRIUM           Index LA diam:      4.30 cm 2.15 cm/m   RA Area:     20.40 cm LA Vol (A2C): 61.1 ml 30.53 ml/m  RA Volume:   57.30 ml  28.63 ml/m LA Vol (A4C): 64.0 ml 31.98 ml/m AORTIC VALVE                 PULMONIC VALVE AV Area (Vmax): 2.85 cm     PV Vmax:  1.25 m/s AV Vmax:        114.50 cm/s  PV Peak grad:  6.2 mmHg AV Peak Grad:   5.2 mmHg LVOT Vmax:      104.00  cm/s LVOT Vmean:     63.400 cm/s LVOT VTI:       0.205 m  AORTA Ao Root diam: 3.80 cm Ao Asc diam:  3.80 cm  MITRAL VALVE                TRICUSPID VALVE MV Area (PHT): 4.17 cm     TR Peak grad:   22.7 mmHg MV Decel Time: 182 msec     TR Vmax:        238.00 cm/s MV E velocity: 122.00 cm/s SHUNTS Systemic VTI:  0.20 m Systemic Diam: 2.00 cm  Donato Schultz MD Electronically signed by Donato Schultz MD Signature Date/Time: 06/12/2021/3:35:04 PM    Final          ______________________________________________________________________________________________      EKG Interpretation Date/Time:  Thursday June 18 2023 13:42:36 EST Ventricular Rate:  63 PR Interval:    QRS Duration:  78 QT Interval:  378 QTC Calculation: 386 R Axis:   42  Text Interpretation: Atrial fibrillation With a controlled rate When compared with ECG of 12-Sep-2021 15:12, PREVIOUS ECG IS PRESENT Confirmed by Norman Herrlich ([id number]) on 06/18/2023 1:46:02 PM    Physical Exam:    VS:  BP 122/68   Pulse 63   Ht 5\' 7"  (1.702 m)   Wt 152 lb 12.8 oz (69.3 kg)   SpO2 95%   BMI 23.93 kg/m     Wt Readings from Last 3 Encounters:  06/18/23 152 lb 12.8 oz (69.3 kg)  12/04/22 164 lb (74.4 kg)  05/02/22 176 lb (79.8 kg)     GEN: He looks very frail well nourished, well developed in no acute distress HEENT: Normal NECK: No JVD; No carotid bruits LYMPHATICS: No lymphadenopathy CARDIAC: Irregular rhythm variable first heart sound  RESPIRATORY:  Clear to auscultation without rales, wheezing or rhonchi  ABDOMEN: Soft, non-tender, non-distended MUSCULOSKELETAL: Bilateral lower extremity +1 edema; No deformity  SKIN: Warm and dry NEUROLOGIC:  Alert and oriented x 3 PSYCHIATRIC:  Normal affect    Signed, Norman Herrlich, MD  06/18/2023 1:58 PM    [facility]

## 2023-06-18 ENCOUNTER — Ambulatory Visit: Payer: Medicare HMO | Attending: Cardiology | Admitting: Cardiology

## 2023-06-18 ENCOUNTER — Encounter: Payer: Self-pay | Admitting: Cardiology

## 2023-06-18 VITALS — BP 122/68 | HR 63 | Ht 67.0 in | Wt 152.8 lb

## 2023-06-18 DIAGNOSIS — I4821 Permanent atrial fibrillation: Secondary | ICD-10-CM

## 2023-06-18 DIAGNOSIS — I5032 Chronic diastolic (congestive) heart failure: Secondary | ICD-10-CM

## 2023-06-18 DIAGNOSIS — Z7901 Long term (current) use of anticoagulants: Secondary | ICD-10-CM | POA: Diagnosis not present

## 2023-06-18 DIAGNOSIS — I110 Hypertensive heart disease with heart failure: Secondary | ICD-10-CM

## 2023-06-18 MED ORDER — FUROSEMIDE 40 MG PO TABS: 40.0000 mg | ORAL_TABLET | Freq: Two times a day (BID) | ORAL | 3 refills | Status: DC

## 2023-06-18 MED ORDER — CARVEDILOL 6.25 MG PO TABS: 6.2500 mg | ORAL_TABLET | Freq: Two times a day (BID) | ORAL | 3 refills | Status: DC

## 2023-06-18 NOTE — Patient Instructions (Signed)
 Medication Instructions:  Your physician has recommended you make the following change in your medication:   START: Furosemide 40 mg two times daily START: Carvedilol 6.25 mg two times daily  *If you need a refill on your cardiac medications before your next appointment, please call your pharmacy*   Lab Work: Your physician recommends that you return for lab work in:   Labs today: BMP, Pro BNP  If you have labs (blood work) drawn today and your tests are completely normal, you will receive your results only by: MyChart Message (if you have MyChart) OR A paper copy in the mail If you have any lab test that is abnormal or we need to change your treatment, we will call you to review the results.   Testing/Procedures: None   Follow-Up: At [facility], you and your health needs are our priority.  As part of our continuing mission to provide you with exceptional heart care, we have created designated Provider Care Teams.  These Care Teams include your primary Cardiologist (physician) and Advanced Practice Providers (APPs -  Physician Assistants and Nurse Practitioners) who all work together to provide you with the care you need, when you need it.  We recommend signing up for the patient portal called "MyChart".  Sign up information is provided on this After Visit Summary.  MyChart is used to connect with patients for Virtual Visits (Telemedicine).  Patients are able to view lab/test results, encounter notes, upcoming appointments, etc.  Non-urgent messages can be sent to your provider as well.   To learn more about what you can do with MyChart, go to [email or web address].    Your next appointment:   6 month(s)  Provider:   Wallis Bamberg, NP [facility])    Other Instructions None

## 2023-06-19 LAB — BASIC METABOLIC PANEL
BUN/Creatinine Ratio: 17 (ref 10–24)
BUN: 13 mg/dL (ref 8–27)
CO2: 29 mmol/L (ref 20–29)
Calcium: 9.4 mg/dL (ref 8.6–10.2)
Chloride: 97 mmol/L (ref 96–106)
Creatinine, Ser: 0.75 mg/dL — ABNORMAL LOW (ref 0.76–1.27)
Glucose: 69 mg/dL — ABNORMAL LOW (ref 70–99)
Potassium: 3.9 mmol/L (ref 3.5–5.2)
Sodium: 140 mmol/L (ref 134–144)
eGFR: 94 mL/min/{1.73_m2} (ref >59)

## 2023-06-19 LAB — PRO B NATRIURETIC PEPTIDE: NT-Pro BNP: 1002 pg/mL — ABNORMAL HIGH (ref 0–486)

## 2023-07-08 ENCOUNTER — Telehealth: Payer: Self-pay | Admitting: Cardiology

## 2023-07-08 NOTE — Telephone Encounter (Signed)
Recommendations reviewed with pt as per Dr. Krasowski's note.  Pt verbalized understanding and had no additional questions.  

## 2023-07-08 NOTE — Telephone Encounter (Signed)
 Spoke with patient who states that he has increased swelling in his lower legs. Pt reports that the swelling is worse in the left lower leg than the right leg. Pt denies any calf pain or shortness of breath. Pt states that he is not adding salt to his food and is eating low sodium or no sodium foods. Pt states that his BP is 137/70 and 140/82. Pt does not weight himself daily but has been advised to start. Please advise

## 2023-07-08 NOTE — Telephone Encounter (Signed)
 Pt c/o swelling/edema: STAT if pt has developed SOB within 24 hours  If swelling, where is the swelling located? Legs/feet  How much weight have you gained and in what time span? Has not done  Have you gained 2 pounds in a day or 5 pounds in a week? Doesn't done  Do you have a log of your daily weights (if so, list)? no  Are you currently taking a fluid pill? yes  Are you currently SOB? no  Have you traveled recently in a car or plane for an extended period of time? no

## 2023-07-10 NOTE — Telephone Encounter (Signed)
 Pt is requesting a callback from nurse regarding the increase not helping pt. He'd like to be advised on what to do. Please advise

## 2023-07-10 NOTE — Telephone Encounter (Signed)
 Spoke with pt who states that he still has swelling. Weight went from 154 (07/09/23) to 149 today. Denies shortness of breath. No vital signs to report.

## 2023-07-13 MED ORDER — FUROSEMIDE 40 MG PO TABS: 60.0000 mg | ORAL_TABLET | Freq: Every day | ORAL | 3 refills | Status: DC

## 2023-07-13 NOTE — Telephone Encounter (Signed)
 Spoke with pt who states that his feet are better but still swollen. Pt states he his weight has been stable. No shortness of breath. Recommendations reviewed. Pt verbalized understanding and had no additional questions.

## 2023-07-13 NOTE — Addendum Note (Signed)
 Addended by: Eleonore Chiquito on: 07/13/2023 09:07 AM   Modules accepted: Orders

## 2023-07-24 DIAGNOSIS — R0609 Other forms of dyspnea: Secondary | ICD-10-CM | POA: Insufficient documentation

## 2023-08-04 ENCOUNTER — Telehealth: Payer: Self-pay | Admitting: Cardiology

## 2023-08-04 MED ORDER — FUROSEMIDE 40 MG PO TABS: 60.0000 mg | ORAL_TABLET | Freq: Every day | ORAL | 3 refills | Status: AC

## 2023-08-04 NOTE — Telephone Encounter (Signed)
 Pt c/o medication issue:  1. Name of Medication:   furosemide (LASIX) 40 MG tablet   2. How are you currently taking this medication (dosage and times per day)?   3. Are you having a reaction (difficulty breathing--STAT)?   4. What is your medication issue?   Patient stated he has been taking 1.5 tablets by mouth 2x daily.  Patient wants a call back to confirm the dosage of this medication he should be taking.

## 2023-08-04 NOTE — Telephone Encounter (Signed)
 Advised that he is to be taking Lasix 60 mg daily. Keep a daily log of weight and call for weight gain of 2-3 pounds in a day 5 pounds in a week. Pt verbalized understanding and had no additional questions.

## 2023-08-04 NOTE — Telephone Encounter (Signed)
*  STAT* If patient is at the pharmacy, call can be transferred to refill team.   1. Which medications need to be refilled? (please list name of each medication and dose if known)   furosemide (LASIX) 40 MG tablet   2. Would you like to learn more about the convenience, safety, & potential cost savings by using the [facility]?   3. Are you open to using the [facility] (Type [facility]. ).  4. Which pharmacy/location (including street and city if local pharmacy) is medication to be sent to?  [facility] - [place], [place] - [address]   5. Do they need a 30 day or 90 day supply?   90 day  Patient stated he is completely out of this medication.

## 2023-08-04 NOTE — Telephone Encounter (Signed)
 RX has been sent.

## 2023-12-14 NOTE — Progress Notes (Signed)
 Cardiology Office Note   Date:  12/17/2023  ID:  Logan [name], DOB Sep 09, 1945, MRN [id number] PCP: Jackolyn Darice BROCKS, FNP  [facility] Providers Cardiologist:  Redell Leiter, MD Electrophysiologist:  Will Gladis Norton, MD     History of Present Illness [name] is a 78 y.o. male with a past medical history of HFpEF, nonobstructive CAD per cardiac CT, history of PE and DVT, renal artery stenosis, hypertension, permanent atrial fibrillation, COPD, OSA, DM2.  06/12/2021 echo EF 60 to 65% 10/18/2020 cardioversion 02/29/2020 atrial fibrillation ablation 02/23/2020 cardiac CT coronary calcifications noted in all 3 coronary distributions with a calcium score 17.2, 19th percentile 09/13/2019 echo EF 55 to 60%, mild concentric LVH, RA severely dilated  He is a [facility]standing patient of [facility], for the management of his atrial fibrillation.  He was referred to EP and underwent atrial fibrillation ablation in 2021 however at his follow-up appointment and he was found to be back in atrial fibrillation and feeling poorly and he underwent a repeat cardioversion which was unsuccessful.  He established care with Dr. Leiter on 10/26/2020, was feeling poorly on his antiarrhythmic but was also on multiple medications which could contribute to his malaise.  There was some discussion about admission for Tikosyn however it appears that the decision had been made for rate control strategies.  Most recently was evaluated by Dr. Leiter on 06/18/2023, his rate was controlled, he was having problems with edema so his diuretic was increased to twice daily and his carvedilol  was decreased to avoid orthostatic hypotension, plans were made to follow-up in 6 months.  He presents today for follow-up, he has been doing well since he was last evaluated in our office, no formal complaints from a cardiac perspective.  He has been bothered by claudication type pain when he walks.  Recently had lab work with his PCP: 10/30/2023  potassium 3.9, creatinine 0.65, normal LFTs, LDL 120, hemoglobin 13, hematocrit 39.6, TSH normal. He denies chest pain, palpitations, dyspnea, pnd, orthopnea, n, v, dizziness, syncope, edema, weight gain, or early satiety.   ROS: Review of Systems  Cardiovascular:  Positive for claudication.  All other systems reviewed and are negative.    Studies Reviewed      Cardiac Studies & Procedures   ______________________________________________________________________________________________     ECHOCARDIOGRAM  ECHOCARDIOGRAM COMPLETE 06/12/2021  Narrative ECHOCARDIOGRAM REPORT    Patient Name:   Logan [name] Date of Exam: 06/12/2021 Medical Rec #:  [id number]   Height:       67.0 in Accession #:    [id number]  Weight:       195.1 lb Date of Birth:  02-Jul-1945   BSA:          2.001 m Patient Age:    75 years    BP:           106/48 mmHg Patient Gender: M           HR:           68 bpm. Exam Location:  Inpatient  Procedure: 2D Echo  Indications:    Pulmonary embolism  History:        Patient has prior history of Echocardiogram examinations, most recent 09/13/2019. Arrythmias:Atrial Fibrillation; Risk Factors:Hypertension.  Sonographer:    Jeanine Russo Referring Phys: [id number] A CALDWELL POWELL JR  IMPRESSIONS   1. Left ventricular ejection fraction, by estimation, is 60 to 65%. The left ventricle has normal function. The left ventricle has no regional wall motion abnormalities. Left ventricular diastolic  parameters are indeterminate. 2. Right ventricular systolic function is normal. The right ventricular size is normal. There is normal pulmonary artery systolic pressure. 3. The mitral valve is normal in structure. No evidence of mitral valve regurgitation. No evidence of mitral stenosis. 4. The aortic valve was not well visualized. There is mild calcification of the aortic valve. Aortic valve regurgitation is not visualized. No aortic stenosis is present. 5. The inferior vena cava  is normal in size with greater than 50% respiratory variability, suggesting right atrial pressure of 3 mmHg.  FINDINGS Left Ventricle: Left ventricular ejection fraction, by estimation, is 60 to 65%. The left ventricle has normal function. The left ventricle has no regional wall motion abnormalities. The left ventricular internal cavity size was normal in size. There is no left ventricular hypertrophy. Left ventricular diastolic parameters are indeterminate.  Right Ventricle: The right ventricular size is normal. No increase in right ventricular wall thickness. Right ventricular systolic function is normal. There is normal pulmonary artery systolic pressure. The tricuspid regurgitant velocity is 2.38 m/s, and with an assumed right atrial pressure of 5 mmHg, the estimated right ventricular systolic pressure is 27.7 mmHg.  Left Atrium: Left atrial size was normal in size.  Right Atrium: Right atrial size was normal in size.  Pericardium: There is no evidence of pericardial effusion.  Mitral Valve: The mitral valve is normal in structure. No evidence of mitral valve regurgitation. No evidence of mitral valve stenosis.  Tricuspid Valve: The tricuspid valve is normal in structure. Tricuspid valve regurgitation is trivial. No evidence of tricuspid stenosis.  Aortic Valve: The aortic valve was not well visualized. There is mild calcification of the aortic valve. Aortic valve regurgitation is not visualized. No aortic stenosis is present. Aortic valve peak gradient measures 5.2 mmHg.  Pulmonic Valve: The pulmonic valve was normal in structure. Pulmonic valve regurgitation is not visualized. No evidence of pulmonic stenosis.  Aorta: The aortic root is normal in size and structure.  Venous: The inferior vena cava is normal in size with greater than 50% respiratory variability, suggesting right atrial pressure of 3 mmHg.  IAS/Shunts: No atrial level shunt detected by color flow Doppler.   LEFT  VENTRICLE PLAX 2D LVIDd:         4.10 cm LVIDs:         2.20 cm LV PW:         0.90 cm LV IVS:        1.00 cm LVOT diam:     2.00 cm LV SV:         64 LV SV Index:   32 LVOT Area:     3.14 cm   RIGHT VENTRICLE             IVC RV Basal diam:  3.60 cm     IVC diam: 1.90 cm RV S prime:     13.70 cm/s TAPSE (M-mode): 1.6 cm  LEFT ATRIUM           Index        RIGHT ATRIUM           Index LA diam:      4.30 cm 2.15 cm/m   RA Area:     20.40 cm LA Vol (A2C): 61.1 ml 30.53 ml/m  RA Volume:   57.30 ml  28.63 ml/m LA Vol (A4C): 64.0 ml 31.98 ml/m AORTIC VALVE                 PULMONIC VALVE AV  Area (Vmax): 2.85 cm     PV Vmax:       1.25 m/s AV Vmax:        114.50 cm/s  PV Peak grad:  6.2 mmHg AV Peak Grad:   5.2 mmHg LVOT Vmax:      104.00 cm/s LVOT Vmean:     63.400 cm/s LVOT VTI:       0.205 m  AORTA Ao Root diam: 3.80 cm Ao Asc diam:  3.80 cm  MITRAL VALVE                TRICUSPID VALVE MV Area (PHT): 4.17 cm     TR Peak grad:   22.7 mmHg MV Decel Time: 182 msec     TR Vmax:        238.00 cm/s MV E velocity: 122.00 cm/s SHUNTS Systemic VTI:  0.20 m Systemic Diam: 2.00 cm  Oneil Parchment MD Electronically signed by Oneil Parchment MD Signature Date/Time: 06/12/2021/3:35:04 PM    Final          ______________________________________________________________________________________________      Risk Assessment/Calculations  CHA2DS2-VASc Score = 6   This indicates a 9.7% annual risk of stroke. The patient's score is based upon: CHF History: 1 HTN History: 1 Diabetes History: 1 Stroke History: 0 Vascular Disease History: 1 Age Score: 2 Gender Score: 0         Physical Exam VS:  BP 120/60   Pulse 78   Ht 5' 7 (1.702 m)   Wt 165 lb (74.8 kg)   SpO2 96%   BMI 25.84 kg/m        Wt Readings from Last 3 Encounters:  12/15/23 165 lb (74.8 kg)  06/18/23 152 lb 12.8 oz (69.3 kg)  12/04/22 164 lb (74.4 kg)    GEN: Well nourished, well developed in no  acute distress NECK: No JVD; No carotid bruits CARDIAC: Regularly irregular, no murmurs, rubs, gallops RESPIRATORY:  Clear to auscultation without rales, wheezing or rhonchi  ABDOMEN: Soft, non-tender, non-distended EXTREMITIES:  No edema; No deformity   ASSESSMENT AND PLAN Permanent atrial fibrillation/hypercoagulable state-CHA2DS2-VASc score is 6, his rate is controlled.  Continue Coreg  but we will decrease his dose to 3.125 mg twice daily as he has been bothered by dizziness at times.  Continue Eliquis  5 mg twice daily--no indication for dose reduction and lab work was checked by his PCP last month revealing creatinine 0.69, stable H&H, tolerating without any adverse bleeding effects.  HFpEF-NYHA class I-II, continue Coreg  but decreasing dose to 3.25 mg twice daily, continue losartan  100 mg daily, continue Lasix  60 mg daily.  Hypertension-blood pressure is controlled 120/60, continue losartan  100 mg daily, Coreg  3.125 mg twice daily.  Claudication-will arrange for ABIs and LEA's  Dyslipidemia-monitored by his PCP, does not appear is on any lipid-lowering medication at this time, but he does have statins listed as an allergy and states he cannot take anything.       Dispo: Decrease Coreg  to 3.25 mg twice daily, ABIs and LEA's, follow-up in 6 months with Dr. Monetta.  Signed, Delon JAYSON Hoover, NP

## 2023-12-15 ENCOUNTER — Ambulatory Visit: Attending: Cardiology | Admitting: Cardiology

## 2023-12-15 ENCOUNTER — Encounter: Payer: Self-pay | Admitting: Cardiology

## 2023-12-15 VITALS — BP 120/60 | HR 78 | Ht 67.0 in | Wt 165.0 lb

## 2023-12-15 DIAGNOSIS — I4821 Permanent atrial fibrillation: Secondary | ICD-10-CM | POA: Diagnosis not present

## 2023-12-15 DIAGNOSIS — I739 Peripheral vascular disease, unspecified: Secondary | ICD-10-CM | POA: Diagnosis not present

## 2023-12-15 DIAGNOSIS — I5032 Chronic diastolic (congestive) heart failure: Secondary | ICD-10-CM

## 2023-12-15 DIAGNOSIS — E782 Mixed hyperlipidemia: Secondary | ICD-10-CM | POA: Diagnosis not present

## 2023-12-15 DIAGNOSIS — I10 Essential (primary) hypertension: Secondary | ICD-10-CM

## 2023-12-15 DIAGNOSIS — Z7901 Long term (current) use of anticoagulants: Secondary | ICD-10-CM | POA: Diagnosis not present

## 2023-12-15 MED ORDER — CARVEDILOL 3.125 MG PO TABS: 3.1250 mg | ORAL_TABLET | Freq: Two times a day (BID) | ORAL | 3 refills | Status: DC

## 2023-12-15 NOTE — Patient Instructions (Signed)
 Medication Instructions:  Your physician has recommended you make the following change in your medication:  Decrease Coreg  to 3.125 two times daily  *If you need a refill on your cardiac medications before your next appointment, please call your pharmacy*  Lab Work: NONE If you have labs (blood work) drawn today and your tests are completely normal, you will receive your results only by: MyChart Message (if you have MyChart) OR A paper copy in the mail If you have any lab test that is abnormal or we need to change your treatment, we will call you to review the results.  Testing/Procedures: Your physician has requested that you have an ankle brachial index (ABI). During this test an ultrasound and blood pressure cuff are used to evaluate the arteries that supply the arms and legs with blood. Allow thirty minutes for this exam. There are no restrictions or special instructions.  Please note: We ask at that you not bring children with you during ultrasound (echo/ vascular) testing. Due to room size and safety concerns, children are not allowed in the ultrasound rooms during exams. Our front office staff cannot provide observation of children in our lobby area while testing is being conducted. An adult accompanying a patient to their appointment will only be allowed in the ultrasound room at the discretion of the ultrasound technician under special circumstances. We apologize for any inconvenience.   Your physician has requested that you have a lower or upper extremity arterial duplex. This test is an ultrasound of the arteries in the legs or arms. It looks at arterial blood flow in the legs and arms. Allow one hour for Lower and Upper Arterial scans. There are no restrictions or special instructions.  Please note: We ask at that you not bring children with you during ultrasound (echo/ vascular) testing. Due to room size and safety concerns, children are not allowed in the ultrasound rooms during  exams. Our front office staff cannot provide observation of children in our lobby area while testing is being conducted. An adult accompanying a patient to their appointment will only be allowed in the ultrasound room at the discretion of the ultrasound technician under special circumstances. We apologize for any inconvenience.   Follow-Up: At [facility], you and your health needs are our priority.  As part of our continuing mission to provide you with exceptional heart care, our providers are all part of one team.  This team includes your primary Cardiologist (physician) and Advanced Practice Providers or APPs (Physician Assistants and Nurse Practitioners) who all work together to provide you with the care you need, when you need it.  Your next appointment:   6 month(s)  Provider:   Redell Leiter, MD    We recommend signing up for the patient portal called MyChart.  Sign up information is provided on this After Visit Summary.  MyChart is used to connect with patients for Virtual Visits (Telemedicine).  Patients are able to view lab/test results, encounter notes, upcoming appointments, etc.  Non-urgent messages can be sent to your provider as well.   To learn more about what you can do with MyChart, go to [email or web address].   Other Instructions

## 2024-01-13 ENCOUNTER — Ambulatory Visit: Attending: Cardiology

## 2024-01-13 ENCOUNTER — Ambulatory Visit

## 2024-01-13 DIAGNOSIS — E782 Mixed hyperlipidemia: Secondary | ICD-10-CM

## 2024-01-13 DIAGNOSIS — I4821 Permanent atrial fibrillation: Secondary | ICD-10-CM

## 2024-01-13 DIAGNOSIS — Z7901 Long term (current) use of anticoagulants: Secondary | ICD-10-CM

## 2024-01-13 DIAGNOSIS — I739 Peripheral vascular disease, unspecified: Secondary | ICD-10-CM

## 2024-01-15 ENCOUNTER — Ambulatory Visit: Payer: Self-pay | Admitting: Cardiology

## 2024-01-18 ENCOUNTER — Other Ambulatory Visit: Payer: Self-pay

## 2024-01-18 DIAGNOSIS — E782 Mixed hyperlipidemia: Secondary | ICD-10-CM

## 2024-01-21 ENCOUNTER — Ambulatory Visit: Payer: Self-pay | Admitting: Cardiology

## 2024-01-21 DIAGNOSIS — E782 Mixed hyperlipidemia: Secondary | ICD-10-CM

## 2024-01-21 LAB — HEPATIC FUNCTION PANEL
ALT: 16 IU/L (ref 0–44)
AST: 21 IU/L (ref 0–40)
Albumin: 4.2 g/dL (ref 3.8–4.8)
Alkaline Phosphatase: 77 IU/L (ref 47–123)
Bilirubin Total: 0.5 mg/dL (ref 0.0–1.2)
Bilirubin, Direct: 0.19 mg/dL (ref 0.00–0.40)
Total Protein: 6.5 g/dL (ref 6.0–8.5)

## 2024-01-21 LAB — LIPID PANEL
Chol/HDL Ratio: 3.4 ratio (ref 0.0–5.0)
Cholesterol, Total: 188 mg/dL (ref 100–199)
HDL: 56 mg/dL
LDL Chol Calc (NIH): 120 mg/dL — ABNORMAL HIGH (ref 0–99)
Triglycerides: 63 mg/dL (ref 0–149)
VLDL Cholesterol Cal: 12 mg/dL (ref 5–40)

## 2024-01-21 LAB — LIPOPROTEIN A (LPA): Lipoprotein (a): 36 nmol/L

## 2024-01-21 MED ORDER — NEXLIZET 180-10 MG PO TABS: 1.0000 | ORAL_TABLET | Freq: Every day | ORAL | Status: AC

## 2024-01-21 MED ORDER — NEXLIZET 180-10 MG PO TABS: 1.0000 | ORAL_TABLET | Freq: Every day | ORAL | 3 refills | Status: AC

## 2024-01-21 MED ORDER — CARVEDILOL 6.25 MG PO TABS: 6.2500 mg | ORAL_TABLET | Freq: Two times a day (BID) | ORAL | 3 refills | Status: AC

## 2024-01-21 NOTE — Addendum Note (Signed)
 Addended by: ONEITA BERLINER on: 01/21/2024 01:10 PM   Modules accepted: Orders

## 2024-01-21 NOTE — Telephone Encounter (Signed)
 Results reviewed with pt as per Logan Hoover NP's note.  Pt verbalized understanding and had no additional questions. Routed to PCP.   Pt reports he went back to the increased dose of Carvedilol  as the 3.125 mg caused his BP to increase to 155-160/80.

## 2024-02-15 ENCOUNTER — Telehealth: Payer: Self-pay | Admitting: Pharmacy Technician

## 2024-02-15 ENCOUNTER — Other Ambulatory Visit (HOSPITAL_COMMUNITY): Payer: Self-pay

## 2024-02-15 ENCOUNTER — Telehealth: Payer: Self-pay | Admitting: Cardiology

## 2024-02-15 NOTE — Telephone Encounter (Signed)
 Pharmacy Patient Advocate Encounter  Received notification from [employer] that Prior Authorization for nexlizet has been APPROVED from 04/22/23 to 04/20/25   PA #/Case ID/Reference #: [id number]    I asked [facility] to run now

## 2024-02-15 NOTE — Telephone Encounter (Signed)
   Pharmacy Patient Advocate Encounter   Received notification from CoverMyMeds that prior authorization for nexlizet is required/requested.   Insurance verification completed.   The patient is insured through [employer].   Per test claim: PA required; PA submitted to above mentioned insurance via Latent Key/confirmation #/EOC [id number] Status is pending

## 2024-02-15 NOTE — Telephone Encounter (Signed)
 Pt c/o medication issue:  1. Name of Medication:   Bempedoic Acid-Ezetimibe (NEXLIZET) 180-10 MG TABS    2. How are you currently taking this medication (dosage and times per day)?    3. Are you having a reaction (difficulty breathing--STAT)? no  4. What is your medication issue? PA is needed for this patient medication. Please advise public relations account executive pharmacy)

## 2024-02-15 NOTE — Telephone Encounter (Signed)
 This is filled

## 2024-06-20 ENCOUNTER — Ambulatory Visit: Admitting: Cardiology
